# Patient Record
Sex: Male | Born: 1935 | Race: White | Hispanic: No | Marital: Married | Smoking: Former smoker
Health system: Southern US, Community
[De-identification: ages and names within clinical notes are randomized; demographics above are authoritative.]

## PROBLEM LIST (undated history)

## (undated) DIAGNOSIS — C4492 Squamous cell carcinoma of skin, unspecified: Secondary | ICD-10-CM

## (undated) DIAGNOSIS — E785 Hyperlipidemia, unspecified: Secondary | ICD-10-CM

## (undated) DIAGNOSIS — J449 Chronic obstructive pulmonary disease, unspecified: Secondary | ICD-10-CM

## (undated) DIAGNOSIS — B029 Zoster without complications: Secondary | ICD-10-CM

## (undated) DIAGNOSIS — J4489 Other specified chronic obstructive pulmonary disease: Secondary | ICD-10-CM

## (undated) DIAGNOSIS — I7100 Dissection of unspecified site of aorta: Secondary | ICD-10-CM

## (undated) DIAGNOSIS — L439 Lichen planus, unspecified: Secondary | ICD-10-CM

## (undated) DIAGNOSIS — I2510 Atherosclerotic heart disease of native coronary artery without angina pectoris: Secondary | ICD-10-CM

## (undated) HISTORY — DX: Lichen planus, unspecified: L43.9

## (undated) HISTORY — DX: Dissection of unspecified site of aorta: I71.00

## (undated) HISTORY — PX: PTCA: SHX146

## (undated) HISTORY — PX: CARDIAC CATHETERIZATION: SHX172

## (undated) HISTORY — DX: Chronic obstructive pulmonary disease, unspecified: J44.9

## (undated) HISTORY — DX: Other specified chronic obstructive pulmonary disease: J44.89

## (undated) HISTORY — DX: Atherosclerotic heart disease of native coronary artery without angina pectoris: I25.10

## (undated) HISTORY — DX: Squamous cell carcinoma of skin, unspecified: C44.92

## (undated) HISTORY — DX: Hyperlipidemia, unspecified: E78.5

## (undated) HISTORY — DX: Zoster without complications: B02.9

---

## 1991-08-25 HISTORY — PX: BACK SURGERY: SHX140

## 1998-07-22 ENCOUNTER — Emergency Department (HOSPITAL_COMMUNITY): Admission: EM | Admit: 1998-07-22 | Discharge: 1998-07-22 | Payer: Self-pay | Admitting: Emergency Medicine

## 2001-04-04 ENCOUNTER — Encounter: Payer: Self-pay | Admitting: Specialist

## 2001-04-04 ENCOUNTER — Encounter: Admission: RE | Admit: 2001-04-04 | Discharge: 2001-04-04 | Payer: Self-pay | Admitting: Specialist

## 2003-09-13 ENCOUNTER — Ambulatory Visit (HOSPITAL_COMMUNITY): Admission: RE | Admit: 2003-09-13 | Discharge: 2003-09-13 | Payer: Self-pay | Admitting: Internal Medicine

## 2004-02-23 ENCOUNTER — Inpatient Hospital Stay (HOSPITAL_COMMUNITY): Admission: EM | Admit: 2004-02-23 | Discharge: 2004-02-27 | Payer: Self-pay | Admitting: Emergency Medicine

## 2004-03-07 ENCOUNTER — Encounter: Admission: RE | Admit: 2004-03-07 | Discharge: 2004-03-07 | Payer: Self-pay | Admitting: Vascular Surgery

## 2004-07-11 ENCOUNTER — Ambulatory Visit: Payer: Self-pay | Admitting: Cardiovascular Disease

## 2004-07-14 ENCOUNTER — Ambulatory Visit: Payer: Self-pay | Admitting: Family Medicine

## 2004-07-22 ENCOUNTER — Ambulatory Visit: Payer: Self-pay | Admitting: Family Medicine

## 2004-10-16 ENCOUNTER — Ambulatory Visit: Payer: Self-pay | Admitting: Family Medicine

## 2005-01-01 ENCOUNTER — Ambulatory Visit: Payer: Self-pay | Admitting: Cardiovascular Disease

## 2005-01-02 ENCOUNTER — Ambulatory Visit: Payer: Self-pay | Admitting: Cardiology

## 2005-06-04 ENCOUNTER — Ambulatory Visit: Payer: Self-pay | Admitting: Family Medicine

## 2005-06-12 ENCOUNTER — Ambulatory Visit: Payer: Self-pay | Admitting: Family Medicine

## 2005-09-24 ENCOUNTER — Ambulatory Visit: Payer: Self-pay | Admitting: Cardiovascular Disease

## 2005-10-01 ENCOUNTER — Ambulatory Visit: Payer: Self-pay

## 2005-11-13 ENCOUNTER — Ambulatory Visit: Payer: Self-pay | Admitting: Gastroenterology

## 2005-12-04 ENCOUNTER — Ambulatory Visit: Payer: Self-pay | Admitting: Gastroenterology

## 2005-12-04 ENCOUNTER — Encounter: Payer: Self-pay | Admitting: Internal Medicine

## 2005-12-04 LAB — HM COLONOSCOPY

## 2005-12-10 ENCOUNTER — Ambulatory Visit: Payer: Self-pay | Admitting: Family Medicine

## 2006-03-25 ENCOUNTER — Ambulatory Visit: Payer: Self-pay | Admitting: Cardiovascular Disease

## 2006-06-17 ENCOUNTER — Ambulatory Visit: Payer: Self-pay | Admitting: Internal Medicine

## 2006-06-17 LAB — CONVERTED CEMR LAB
ALT: 22 units/L (ref 0–40)
AST: 24 units/L (ref 0–37)
BUN: 17 mg/dL (ref 6–23)
CO2: 29 meq/L (ref 19–32)
Creatinine, Ser: 1.1 mg/dL (ref 0.4–1.5)
PSA: 0.85 ng/mL (ref 0.10–4.00)
Potassium: 4.1 meq/L (ref 3.5–5.1)
Triglyceride fasting, serum: 105 mg/dL (ref 0–149)

## 2006-10-07 ENCOUNTER — Ambulatory Visit: Payer: Self-pay | Admitting: Cardiovascular Disease

## 2006-12-17 ENCOUNTER — Ambulatory Visit: Payer: Self-pay | Admitting: Internal Medicine

## 2006-12-23 ENCOUNTER — Ambulatory Visit: Payer: Self-pay | Admitting: Internal Medicine

## 2006-12-23 LAB — CONVERTED CEMR LAB
ALT: 25 units/L (ref 0–40)
AST: 26 units/L (ref 0–37)
Cholesterol: 110 mg/dL (ref 0–200)
Hgb A1c MFr Bld: 6.6 % — ABNORMAL HIGH (ref 4.6–6.0)
Microalb Creat Ratio: 11.9 mg/g (ref 0.0–30.0)
Microalb, Ur: 0.4 mg/dL (ref 0.0–1.9)
Total CHOL/HDL Ratio: 3.4

## 2007-01-14 ENCOUNTER — Ambulatory Visit: Payer: Self-pay | Admitting: Cardiovascular Disease

## 2007-01-14 LAB — CONVERTED CEMR LAB
GFR calc Af Amer: 94 mL/min
GFR calc non Af Amer: 78 mL/min
Potassium: 4.5 meq/L (ref 3.5–5.1)
Sodium: 142 meq/L (ref 135–145)

## 2007-01-20 ENCOUNTER — Ambulatory Visit: Payer: Self-pay | Admitting: Cardiology

## 2007-04-21 ENCOUNTER — Telehealth (INDEPENDENT_AMBULATORY_CARE_PROVIDER_SITE_OTHER): Payer: Self-pay | Admitting: *Deleted

## 2007-06-30 ENCOUNTER — Ambulatory Visit: Payer: Self-pay | Admitting: Internal Medicine

## 2007-06-30 DIAGNOSIS — J449 Chronic obstructive pulmonary disease, unspecified: Secondary | ICD-10-CM

## 2007-06-30 DIAGNOSIS — E785 Hyperlipidemia, unspecified: Secondary | ICD-10-CM | POA: Insufficient documentation

## 2007-06-30 DIAGNOSIS — E11319 Type 2 diabetes mellitus with unspecified diabetic retinopathy without macular edema: Secondary | ICD-10-CM

## 2007-06-30 DIAGNOSIS — I7100 Dissection of unspecified site of aorta: Secondary | ICD-10-CM

## 2007-06-30 DIAGNOSIS — I2510 Atherosclerotic heart disease of native coronary artery without angina pectoris: Secondary | ICD-10-CM

## 2007-07-04 LAB — CONVERTED CEMR LAB
ALT: 24 units/L (ref 0–53)
AST: 23 units/L (ref 0–37)
Basophils Relative: 0.1 % (ref 0.0–1.0)
CO2: 33 meq/L — ABNORMAL HIGH (ref 19–32)
Calcium: 9.7 mg/dL (ref 8.4–10.5)
Chloride: 104 meq/L (ref 96–112)
Eosinophils Relative: 2.7 % (ref 0.0–5.0)
GFR calc non Af Amer: 77 mL/min
Glucose, Bld: 137 mg/dL — ABNORMAL HIGH (ref 70–99)
PSA: 0.89 ng/mL (ref 0.10–4.00)
Platelets: 257 10*3/uL (ref 150–400)
RBC: 5.02 M/uL (ref 4.22–5.81)
WBC: 10 10*3/uL (ref 4.5–10.5)

## 2007-07-06 ENCOUNTER — Telehealth (INDEPENDENT_AMBULATORY_CARE_PROVIDER_SITE_OTHER): Payer: Self-pay | Admitting: *Deleted

## 2007-07-27 ENCOUNTER — Telehealth: Payer: Self-pay | Admitting: Internal Medicine

## 2007-08-16 ENCOUNTER — Ambulatory Visit: Payer: Self-pay | Admitting: Pulmonary Disease

## 2007-08-16 DIAGNOSIS — J309 Allergic rhinitis, unspecified: Secondary | ICD-10-CM

## 2007-08-23 ENCOUNTER — Telehealth: Payer: Self-pay | Admitting: Pulmonary Disease

## 2007-09-06 ENCOUNTER — Ambulatory Visit: Payer: Self-pay | Admitting: Pulmonary Disease

## 2007-09-28 ENCOUNTER — Encounter: Admission: RE | Admit: 2007-09-28 | Discharge: 2007-09-28 | Payer: Self-pay | Admitting: Internal Medicine

## 2007-09-28 ENCOUNTER — Ambulatory Visit: Payer: Self-pay | Admitting: Internal Medicine

## 2007-09-28 LAB — CONVERTED CEMR LAB
Bilirubin Urine: NEGATIVE
Specific Gravity, Urine: 1.01
Urobilinogen, UA: NEGATIVE
WBC Urine, dipstick: NEGATIVE
pH: 5

## 2007-10-03 ENCOUNTER — Telehealth: Payer: Self-pay | Admitting: Internal Medicine

## 2007-10-10 ENCOUNTER — Ambulatory Visit: Payer: Self-pay | Admitting: Cardiovascular Disease

## 2007-10-13 ENCOUNTER — Ambulatory Visit: Payer: Self-pay | Admitting: Internal Medicine

## 2007-10-28 ENCOUNTER — Telehealth (INDEPENDENT_AMBULATORY_CARE_PROVIDER_SITE_OTHER): Payer: Self-pay | Admitting: *Deleted

## 2007-12-28 ENCOUNTER — Ambulatory Visit: Payer: Self-pay | Admitting: Internal Medicine

## 2007-12-28 LAB — HM DIABETES FOOT EXAM

## 2007-12-29 ENCOUNTER — Ambulatory Visit: Payer: Self-pay

## 2007-12-30 ENCOUNTER — Ambulatory Visit: Payer: Self-pay | Admitting: Internal Medicine

## 2008-01-03 ENCOUNTER — Ambulatory Visit: Payer: Self-pay | Admitting: Cardiovascular Disease

## 2008-01-04 LAB — CONVERTED CEMR LAB
CO2: 30 meq/L (ref 19–32)
Calcium: 9.7 mg/dL (ref 8.4–10.5)
Chloride: 102 meq/L (ref 96–112)
Creatinine,U: 101.5 mg/dL
Glucose, Bld: 131 mg/dL — ABNORMAL HIGH (ref 70–99)
HDL: 27.7 mg/dL — ABNORMAL LOW (ref >39.0)
Hgb A1c MFr Bld: 6.6 % — ABNORMAL HIGH (ref 4.6–6.0)
Microalb Creat Ratio: 21.7 mg/g (ref 0.0–30.0)
Microalb, Ur: 2.2 mg/dL — ABNORMAL HIGH (ref 0.0–1.9)
Potassium: 4.9 meq/L (ref 3.5–5.1)
Sodium: 144 meq/L (ref 135–145)

## 2008-01-12 ENCOUNTER — Ambulatory Visit: Payer: Self-pay | Admitting: Internal Medicine

## 2008-01-18 ENCOUNTER — Ambulatory Visit: Payer: Self-pay | Admitting: Internal Medicine

## 2008-01-19 ENCOUNTER — Telehealth (INDEPENDENT_AMBULATORY_CARE_PROVIDER_SITE_OTHER): Payer: Self-pay | Admitting: *Deleted

## 2008-01-19 ENCOUNTER — Telehealth: Payer: Self-pay | Admitting: Internal Medicine

## 2008-03-26 ENCOUNTER — Ambulatory Visit: Payer: Self-pay | Admitting: Internal Medicine

## 2008-04-03 ENCOUNTER — Ambulatory Visit: Payer: Self-pay | Admitting: Internal Medicine

## 2008-04-04 ENCOUNTER — Telehealth (INDEPENDENT_AMBULATORY_CARE_PROVIDER_SITE_OTHER): Payer: Self-pay | Admitting: *Deleted

## 2008-04-17 ENCOUNTER — Ambulatory Visit: Payer: Self-pay | Admitting: Internal Medicine

## 2008-04-23 LAB — CONVERTED CEMR LAB
Calcium: 9 mg/dL (ref 8.4–10.5)
GFR calc Af Amer: 94 mL/min
GFR calc non Af Amer: 77 mL/min
Potassium: 4.2 meq/L (ref 3.5–5.1)
Sodium: 142 meq/L (ref 135–145)

## 2008-05-07 ENCOUNTER — Ambulatory Visit: Payer: Self-pay | Admitting: Internal Medicine

## 2008-06-05 ENCOUNTER — Telehealth (INDEPENDENT_AMBULATORY_CARE_PROVIDER_SITE_OTHER): Payer: Self-pay | Admitting: *Deleted

## 2008-06-18 ENCOUNTER — Ambulatory Visit: Payer: Self-pay | Admitting: Internal Medicine

## 2008-06-21 LAB — CONVERTED CEMR LAB
Creatinine,U: 79.4 mg/dL
Microalb Creat Ratio: 13.9 mg/g (ref 0.0–30.0)
Microalb, Ur: 1.1 mg/dL (ref 0.0–1.9)

## 2008-06-22 ENCOUNTER — Telehealth: Payer: Self-pay | Admitting: Internal Medicine

## 2008-06-23 ENCOUNTER — Ambulatory Visit: Payer: Self-pay | Admitting: Family Medicine

## 2008-10-30 ENCOUNTER — Ambulatory Visit: Payer: Self-pay | Admitting: Family Medicine

## 2008-10-30 ENCOUNTER — Encounter (INDEPENDENT_AMBULATORY_CARE_PROVIDER_SITE_OTHER): Payer: Self-pay | Admitting: *Deleted

## 2008-10-30 DIAGNOSIS — J069 Acute upper respiratory infection, unspecified: Secondary | ICD-10-CM | POA: Insufficient documentation

## 2008-10-31 ENCOUNTER — Emergency Department (HOSPITAL_COMMUNITY): Admission: EM | Admit: 2008-10-31 | Discharge: 2008-11-01 | Payer: Self-pay | Admitting: Emergency Medicine

## 2008-11-01 ENCOUNTER — Telehealth: Payer: Self-pay | Admitting: Internal Medicine

## 2008-11-02 ENCOUNTER — Ambulatory Visit: Payer: Self-pay | Admitting: Internal Medicine

## 2008-11-15 ENCOUNTER — Ambulatory Visit: Payer: Self-pay | Admitting: Internal Medicine

## 2008-11-26 ENCOUNTER — Ambulatory Visit: Payer: Self-pay | Admitting: Internal Medicine

## 2008-11-29 LAB — CONVERTED CEMR LAB
Basophils Absolute: 0 10*3/uL (ref 0.0–0.1)
Basophils Relative: 0.4 % (ref 0.0–3.0)
CO2: 31 meq/L (ref 19–32)
Calcium: 9.5 mg/dL (ref 8.4–10.5)
Glucose, Bld: 90 mg/dL (ref 70–99)
HCT: 43.2 % (ref 39.0–52.0)
Hemoglobin: 14.7 g/dL (ref 13.0–17.0)
Hgb A1c MFr Bld: 6.7 % — ABNORMAL HIGH (ref 4.6–6.5)
Lymphocytes Relative: 22.8 % (ref 12.0–46.0)
Lymphs Abs: 2.2 10*3/uL (ref 0.7–4.0)
MCHC: 33.9 g/dL (ref 30.0–36.0)
Monocytes Relative: 11.9 % (ref 3.0–12.0)
Neutro Abs: 5.7 10*3/uL (ref 1.4–7.7)
Potassium: 4.7 meq/L (ref 3.5–5.1)
RBC: 4.76 M/uL (ref 4.22–5.81)
RDW: 12.1 % (ref 11.5–14.6)
Sodium: 138 meq/L (ref 135–145)

## 2009-01-10 ENCOUNTER — Ambulatory Visit: Payer: Self-pay | Admitting: Cardiovascular Disease

## 2009-01-10 DIAGNOSIS — I4510 Unspecified right bundle-branch block: Secondary | ICD-10-CM

## 2009-04-23 ENCOUNTER — Ambulatory Visit: Payer: Self-pay | Admitting: Internal Medicine

## 2009-05-01 ENCOUNTER — Telehealth (INDEPENDENT_AMBULATORY_CARE_PROVIDER_SITE_OTHER): Payer: Self-pay | Admitting: *Deleted

## 2009-05-01 LAB — CONVERTED CEMR LAB
Cholesterol: 103 mg/dL (ref 0–200)
Creatinine,U: 82.3 mg/dL
Hgb A1c MFr Bld: 7 % — ABNORMAL HIGH (ref 4.6–6.5)
Microalb Creat Ratio: 20.7 mg/g (ref 0.0–30.0)
PSA: 0.79 ng/mL (ref 0.10–4.00)

## 2009-05-22 ENCOUNTER — Telehealth (INDEPENDENT_AMBULATORY_CARE_PROVIDER_SITE_OTHER): Payer: Self-pay | Admitting: *Deleted

## 2009-06-14 ENCOUNTER — Ambulatory Visit: Payer: Self-pay | Admitting: Internal Medicine

## 2009-06-17 ENCOUNTER — Encounter (INDEPENDENT_AMBULATORY_CARE_PROVIDER_SITE_OTHER): Payer: Self-pay | Admitting: *Deleted

## 2009-06-17 LAB — CONVERTED CEMR LAB
ALT: 26 units/L (ref 0–53)
AST: 25 units/L (ref 0–37)
Creatinine, Ser: 1 mg/dL (ref 0.4–1.5)
Hgb A1c MFr Bld: 6.7 % — ABNORMAL HIGH (ref 4.6–6.5)

## 2009-08-26 ENCOUNTER — Ambulatory Visit: Payer: Self-pay | Admitting: Internal Medicine

## 2009-08-27 ENCOUNTER — Telehealth (INDEPENDENT_AMBULATORY_CARE_PROVIDER_SITE_OTHER): Payer: Self-pay | Admitting: *Deleted

## 2009-09-06 ENCOUNTER — Encounter: Payer: Self-pay | Admitting: Cardiovascular Disease

## 2009-09-06 ENCOUNTER — Encounter: Payer: Self-pay | Admitting: Internal Medicine

## 2009-09-06 LAB — CONVERTED CEMR LAB
ALT: 28 units/L
AST: 10 units/L
Calcium: 9.5 mg/dL
Cholesterol: 126 mg/dL
Glucose, Bld: 119 mg/dL
Hgb A1c MFr Bld: 6.8 %
LDL Cholesterol: 57 mg/dL
TSH: 2.28 microintl units/mL
platelet count: 258 10*3/uL

## 2009-10-15 ENCOUNTER — Encounter: Payer: Self-pay | Admitting: Cardiovascular Disease

## 2009-11-19 ENCOUNTER — Encounter: Payer: Self-pay | Admitting: Internal Medicine

## 2009-11-25 ENCOUNTER — Ambulatory Visit: Payer: Self-pay | Admitting: Internal Medicine

## 2009-12-23 ENCOUNTER — Ambulatory Visit: Payer: Self-pay | Admitting: Cardiovascular Disease

## 2009-12-23 ENCOUNTER — Telehealth (INDEPENDENT_AMBULATORY_CARE_PROVIDER_SITE_OTHER): Payer: Self-pay | Admitting: *Deleted

## 2010-01-06 ENCOUNTER — Telehealth: Payer: Self-pay | Admitting: Cardiovascular Disease

## 2010-01-29 ENCOUNTER — Telehealth: Payer: Self-pay | Admitting: Cardiovascular Disease

## 2010-02-26 ENCOUNTER — Telehealth (INDEPENDENT_AMBULATORY_CARE_PROVIDER_SITE_OTHER): Payer: Self-pay | Admitting: *Deleted

## 2010-03-26 ENCOUNTER — Ambulatory Visit: Payer: Self-pay | Admitting: Internal Medicine

## 2010-04-24 ENCOUNTER — Ambulatory Visit: Payer: Self-pay | Admitting: Internal Medicine

## 2010-05-01 ENCOUNTER — Telehealth: Payer: Self-pay | Admitting: Internal Medicine

## 2010-05-01 LAB — CONVERTED CEMR LAB
AST: 24 units/L (ref 0–37)
BUN: 21 mg/dL (ref 6–23)
CO2: 28 meq/L (ref 19–32)
Chloride: 103 meq/L (ref 96–112)
Creatinine, Ser: 1.1 mg/dL (ref 0.4–1.5)
Glucose, Bld: 106 mg/dL — ABNORMAL HIGH (ref 70–99)
Hgb A1c MFr Bld: 6.4 % (ref 4.6–6.5)
Microalb Creat Ratio: 3.3 mg/g (ref 0.0–30.0)
PSA: 0.73 ng/mL (ref 0.10–4.00)

## 2010-05-08 ENCOUNTER — Ambulatory Visit: Payer: Self-pay | Admitting: Internal Medicine

## 2010-05-09 ENCOUNTER — Telehealth (INDEPENDENT_AMBULATORY_CARE_PROVIDER_SITE_OTHER): Payer: Self-pay | Admitting: *Deleted

## 2010-05-12 ENCOUNTER — Telehealth: Payer: Self-pay | Admitting: Cardiovascular Disease

## 2010-05-19 ENCOUNTER — Encounter (INDEPENDENT_AMBULATORY_CARE_PROVIDER_SITE_OTHER): Payer: Self-pay | Admitting: *Deleted

## 2010-05-28 ENCOUNTER — Encounter: Payer: Self-pay | Admitting: Internal Medicine

## 2010-06-24 ENCOUNTER — Ambulatory Visit: Payer: Self-pay | Admitting: Cardiovascular Disease

## 2010-07-03 ENCOUNTER — Ambulatory Visit: Payer: Self-pay | Admitting: Internal Medicine

## 2010-07-03 ENCOUNTER — Ambulatory Visit: Payer: Self-pay | Admitting: Cardiovascular Disease

## 2010-07-03 ENCOUNTER — Telehealth: Payer: Self-pay | Admitting: Internal Medicine

## 2010-07-07 LAB — CONVERTED CEMR LAB: Potassium: 5.1 meq/L (ref 3.5–5.1)

## 2010-07-22 ENCOUNTER — Telehealth (INDEPENDENT_AMBULATORY_CARE_PROVIDER_SITE_OTHER): Payer: Self-pay | Admitting: *Deleted

## 2010-08-01 ENCOUNTER — Telehealth: Payer: Self-pay | Admitting: Internal Medicine

## 2010-09-23 NOTE — Progress Notes (Signed)
Summary: Cholesterol   Phone Note Call from Patient   Caller: Patient Summary of Call: Pt is concerned that his cholesterol wasn't checked the last time he had a cpx. His Cardiologist also told him that it wasn't checked. Is there a reason for this? Please advise. [phone] Initial call taken by: Lavell Islam,  July 03, 2010 9:57 AM  Follow-up for Phone Call        his cholesterol has been under excellent control for years. My recommendation is to recheck once a year. (last time checked 08-2009)   Follow-up by: Shawna Kiener E. Tanette Chauca MD,  July 03, 2010 12:26 PM  Additional Follow-up for Phone Call Additional follow up Details #1::        left message for pt to call back. Army Fossa CMA  July 03, 2010 1:16 PM     Additional Follow-up for Phone Call Additional follow up Details #2::    Pt is aware. Army Fossa CMA  July 03, 2010 1:45 PM

## 2010-09-23 NOTE — Progress Notes (Signed)
Summary: Need samples   Phone Note Call from Patient Call back at Home Phone [phone]   Caller: Spouse/Pat Summary of Call: Need samples Vytorin 10/80mg , PLavix 75mg   Initial call taken by: Judie Grieve,  May 12, 2010 2:31 PM  Follow-up for Phone Call        no samples of Vytorin 10/80, pt in doughnut hole at this point until end of year so meds out of pocket expense. Request generic cholesterol med. adv would check w/MD. Claris Gladden, RN, BSN 9/19 1503 samples up front and not forwarded for md review next week. Claris Gladden, RN, BSN  Additional Follow-up for Phone Call Additional follow up Details #1::        Can try Pravachol 40mg  and F/U lipids and liver in 3 months Additional Follow-up by: Colon Branch, MD, [facility],  May 18, 2010 9:09 PM    Additional Follow-up for Phone Call Additional follow up Details #2::    spoke with pt, he would like to try the pravachol. script sent in Deliah Goody, RN  May 19, 2010 5:00 PM

## 2010-09-23 NOTE — Assessment & Plan Note (Signed)
Summary: CPX/KDC   Vital Signs:  Patient profile:   77 year old male Height:      69.25 inches Weight:      171.13 pounds Pulse rate:   59 / minute Pulse rhythm:   regular BP sitting:   118 / 82  (left arm) Cuff size:   large  Vitals Entered By: Army Fossa CMA (April 24, 2010 8:46 AM) CC: CPX: fasting   History of Present Illness: Here for Medicare AWV:  1.   Risk factors based on Past M, S, F history: yes  2.   Physical Activities: [employer] 3 times a week, active  3.   Depression/mood: denies, no problems noted  4.   Hearing: poor hearing , has hearing aids, sees audiology 5.   ADL's: totally independent  6.   Fall Risk: low  7.   Home Safety: feels safe at home  8.   Height, weight, &visual acuity:see VS, vision corrected see eye doctor yearly  9.   Counseling: yes , see below  10.   Labs ordered based on risk factors: yes  11.           Referral Coordination: if needed  12.           Care Plan: see A/P 13.            Cognitive Assessment : cognition, memory, motor skills seem appropriate.  in addition, we discussed the following issues  COPD, asthma-- uses inhalers hardly ever  Coronary artery disease, good medication compliance  Diabetes-- ambulatory CBGs wnl, 100 this am, never > than 120  Hx of AORTIC DISSECTION-- chart reviewed , [facility]    Preventive Screening-Counseling & Management  Caffeine-Diet-Exercise     Does Patient Exercise: yes     Times/week: 3  Allergies: No Known Drug Allergies  Past History:  Past Medical History: Reviewed history from 08/26/2009 and no changes required.    COPD--asthma    Coronary artery disease,s/p MI 1992    Diabetes mellitus, type II    Hyperlipidemia    Allergic Rhinitis    Hx of AORTIC DISSECTION--f/u by cards  Past Surgical History: Reviewed history from 01/09/2009 and no changes required. Back surgery, 1993   Left heart catheterization with coronary angiography and left  ventriculography.  Percutaneous transluminal coronary angioplasty with placement of a drugeluting stent in the distal right coronary artery.. Percutaneous transluminal coronary angioplasty with placement of a drugeluting stent in the proximal right coronary artery.CARDIOLOGIST:  Carole Binning, M.D.  Family History: Reviewed history from 08/16/2007 and no changes required. 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  (quit 1994) pt is retired from [employer]. Alcohol use-no Drug use-no Regular exercise-- yes, joined a exercise group! diet-- improved! caffeine use - coffee (2 cups daily) Does Patient Exercise:  yes  Review of Systems CV:  Denies chest pain or discomfort and swelling of feet. Resp:  Denies cough, shortness of breath, and wheezing. GI:  Denies bloody stools, diarrhea, and nausea. GU:  Denies dysuria, hematuria, urinary frequency, and urinary hesitancy; Rarely has nocturia .  Physical Exam  General:  alert, well-developed, and well-nourished.   Neck:  no masses, no thyromegaly, and normal carotid upstroke.   Lungs:  normal respiratory effort, no intercostal retractions, no accessory muscle use, and normal  breath sounds.   Heart:  normal rate, regular rhythm, no murmur, and no gallop.   Abdomen:  soft, non-tender, no distention, no masses, no guarding, and no rigidity.  no bruit Rectal:  No external abnormalities noted. Normal sphincter tone. No rectal masses or tenderness. Prostate:  Prostate gland firm and smooth, no enlargement, nodularity, tenderness, mass, asymmetry or induration. Extremities:  lower lower extremity edema Psych:  Cognition and judgment appear intact. Alert and cooperative with normal attention span and concentration,not anxious appearing and not depressed appearing.      Impression & Recommendations:  Problem # 1:  HEALTH SCREENING (ICD-V70.0)  chart reviewed  Td 2008 pneumonia shot 2008 flu shot--today Shingles shot-- had already   Cscope 2007, TICS, no polyps (Dr Jarold Motto). Next Cscope...per GI  prostate is normal, check PSA   counseled  about diet exercise    Orders: Medicare -1st Annual Wellness Visit [phone])  Problem # 2:  HYPERLIPIDEMIA (ICD-272.4) well controlled  His updated medication list for this problem includes:    Vytorin 10-80 Mg Tabs (Ezetimibe-simvastatin) .[name]... 1 by mouth qhs  Labs Reviewed: SGOT: 10 (09/06/2009)   SGPT: 28 (09/06/2009)   HDL:33 (09/06/2009), 32.30 (04/23/2009)  LDL:57 (09/06/2009), 51 (04/23/2009)  Chol:126 (09/06/2009), 103 (04/23/2009)  Trig:181 (09/06/2009), 97.0 (04/23/2009)  Orders: Venipuncture (47829) TLB-ALT (SGPT) (84460-ALT) TLB-AST (SGOT) (84450-SGOT)  Problem # 3:  CORONARY ARTERY DISEASE (ICD-414.00) asymptomatic, controlling all his risk factors His updated medication list for this problem includes:    Metoprolol Tartrate 25 Mg Tabs (Metoprolol tartrate) .[name]... 1 tab by mouth two times a day    Cozaar 25 Mg Tabs (Losartan potassium) .[name]... Take one tablet by mouth daily    Plavix 75 Mg Tabs (Clopidogrel bisulfate) .[name]... Take 1 tablet by mouth once a day    Isosorbide Mononitrate Cr 60 Mg Xr24h-tab (Isosorbide mononitrate) .[name]... Take one tablet by mouth daily    Nitroglycerin 0.4 Mg Subl (Nitroglycerin) .[name]... 1 sl  every 5 min as needed chest pain x 3 tabs    Aspirin 325 Mg Tabs (Aspirin) .[name]... Take 1 tablet by mouth once a day  Orders: TLB-BMP (Basic Metabolic Panel-BMET) (80048-METABOL)  Problem # 4:  COPD (ICD-496) essentially asymptomatic The following medications were removed from the medication list:    Advair Diskus 100-50 Mcg/dose Misc (Fluticasone-salmeterol) .[name]... 1 puff two times a day His updated medication list for this problem includes:    Ventolin Hfa 108 (90  Base) Mcg/act Aers (Albuterol sulfate) .[name]... 2 puff every 6 hours as needed  Problem # 5:  AORTIC DISSECTION (ICD-441.00) last CT done at the [place] was 2-11  Complete Medication List: 1)  Metoprolol Tartrate 25 Mg Tabs (Metoprolol tartrate) .[name].. 1 tab by mouth two times a day 2)  Cozaar 25 Mg Tabs (Losartan potassium) .... Take one tablet by mouth daily 3)  Vytorin 10-80 Mg Tabs (Ezetimibe-simvastatin) .[name].. 1 by mouth qhs 4)  Plavix 75 Mg Tabs (Clopidogrel bisulfate) .... Take 1 tablet by mouth once a day 5)  Isosorbide Mononitrate Cr 60 Mg Xr24h-tab (Isosorbide mononitrate) .... Take one tablet by mouth daily 6)  Nitroglycerin 0.4 Mg Subl (Nitroglycerin) .[name].. 1 sl  every 5 min as needed chest pain x 3 tabs 7)  Ventolin Hfa 108 (90 Base) Mcg/act Aers (Albuterol sulfate) .... 2 puff every 6 hours as needed 8)  Aspirin 325 Mg Tabs (Aspirin) .... Take 1 tablet by mouth once a day 9)  Antivert 25 Mg Tabs (Meclizine hcl) .[name].. 1 by mouth qid  as needed dizziness 10)  Glucophage 500 Mg Tabs (Metformin hcl) .[name].. 1 by mouth once daily. 11)  Freestyle Lite Test Strp (Glucose blood) .... Check bs 1x/day dx 250.00 12)  Freestyle Lite Lancets  .... Checks bs 1x/day dx 250.00 13)  Fish Oil  .... Qd  Other Orders: Flu Vaccine 63yrs + (62831) Administration Flu vaccine - MCR (G0008) TLB-Microalbumin/Creat Ratio, Urine (82043-MALB) TLB-A1C / Hgb A1C (Glycohemoglobin) (83036-A1C) TLB-PSA (Prostate Specific Antigen) (84153-PSA)  Patient Instructions: 1)  Please schedule a follow-up appointment in 6 months .    Risk Factors:  Exercise:  yes    Times per week:  3   Flu Vaccine Consent Questions     Do you have a history of severe allergic reactions to this vaccine? no    Any prior history of allergic reactions to egg and/or gelatin? no    Do you have a sensitivity to the preservative Thimersol? no    Do you have a past history of Guillan-Barre Syndrome? no    Do you currently have an acute febrile  illness? no    Have you ever had a severe reaction to latex? no    Vaccine information given and explained to patient? yes    Are you currently pregnant? no    Lot Number:[id number]   Exp Date:02/21/2011   Site Given  Left Deltoid IM     .lbmedflu

## 2010-09-23 NOTE — Progress Notes (Signed)
Summary: Refill--Metoprolol  Phone Note Refill Request Message from:  Fax from Pharmacy on August 01, 2010 4:48 PM  Refills Requested: Medication #1:  METOPROLOL TARTRATE 25 MG  TABS 1 tab by mouth two times a day   Notes: [facility] Initial call taken by: Lucious Groves CMA,  August 01, 2010 4:48 PM    Prescriptions: METOPROLOL TARTRATE 25 MG  TABS (METOPROLOL TARTRATE) 1 tab by mouth two times a day  #180 x 3   Entered by:   Lucious Groves CMA   Authorized by:   [name]. Brooklyn Alfredo MD   Signed by:   Lucious Groves CMA on 08/01/2010   Method used:   Faxed to ...       [facility]* (retail)       [address] [place], [place]  [address]       Ph: [phone]       Fax: [phone]   RxID:   [id number]

## 2010-09-23 NOTE — Miscellaneous (Signed)
Summary: labs from [place]   Clinical Lists Changes  Observations: Added new observation of PLATELET CNT: 258 10*3/microliter (09/06/2009 16:02) Added new observation of HGB: 14.8 g/dL (04/54/0981 19:14) Added new observation of WBC: 11.12 10*3/mm3 (09/06/2009 16:02) Added new observation of HGBA1C: 6.8 % (09/06/2009 16:02) Added new observation of TSH: 2.28 microintl units/mL (09/06/2009 16:02) Added new observation of SGPT (ALT): 28 units/L (09/06/2009 16:02) Added new observation of SGOT (AST): 10 units/L (09/06/2009 16:02) Added new observation of CALCIUM: 9.5 mg/dL (78/29/5621 30:86) Added new observation of BG RANDOM: 119 mg/dL (57/84/6962 95:28) Added new observation of CREATININE: 1.1 mg/dL (41/32/4401 02:72) Added new observation of POTASSIUM: 4.7 mmol/L (09/06/2009 16:02) Added new observation of SODIUM: 138 mmol/L (09/06/2009 16:02) Added new observation of TRIGLYCERIDE: 181 mg/dL (53/66/4403 47:42) Added new observation of HDL: 33 mg/dL (59/56/3875 64:33) Added new observation of LDL: 57 mg/dL (29/51/8841 66:06) Added new observation of CHOLESTEROL: 126 mg/dL (30/16/0109 32:35)      Lipid Panel Test Date: 09/06/2009                        Value        Units        H/L   Reference  Cholesterol:          126          mg/dL              (573-220) LDL Cholesterol:      57           mg/dL              (25-427) HDL Cholesterol:      33           mg/dL              (06-23) Triglyceride:         181          mg/dL              (76-283) TDVV6H                6.8                                Chemistry Labs Test Date: 09/06/2009                      Value Units        H/L   Reference  Sodium:             138   mmol/L             (137-145) Potassium:          4.7   mmol/L             (3.6-5.0) Creatinine:         1.1   mg/dL              (6.0-7.3) Glucose-random:     119   mg/dL         H    (71-062) Calcium (total):    9.5   mg/dL              (6-94.8) SGOT:                10    U/L                (  10-40) SGPT:               28    U/L                (10-40)    Lab Entry Test Date: 09/06/2009                        Value        Units        H/L   Reference  TSH:                  2.28         mIU/ml             (0.5-5.0)    Complete Blood Count Test Date: 09/06/2009             Value   Units      H/L    Reference  WBC:       11.12 X 10^3/uL     H    (3.5-10.0) Hgb:       14.8  g/dl               (84.6-96.2) Platelets: 258   X 10^3/uL          (150-450)

## 2010-09-23 NOTE — Assessment & Plan Note (Signed)
Summary: remove wax from ear/cbs   Vital Signs:  Patient profile:   77 year old male Weight:      172.25 pounds Pulse rate:   53 / minute Pulse rhythm:   regular BP sitting:   116 / 80  (left arm) Cuff size:   large  Vitals Entered By: Army Fossa CMA (March 26, 2010 1:04 PM) CC: Pt here for dr to look at ears before audiologist appt.   History of Present Illness: apparently had a lot of wax in his ears Last week he put some oil in there and flushed the ears He's concerned because he has a hearing test soon ROS No ear discharge or pain He saw a urologist at the [facility] in  reference to  hematuria, he was told he had a renal cyst, no further workup  Current Medications (verified): 1)  Metoprolol Tartrate 25 Mg  Tabs (Metoprolol Tartrate) .[name].. 1 Tab By Mouth Two Times A Day 2)  Cozaar 25 Mg Tabs (Losartan Potassium) .... Take One Tablet By Mouth Daily 3)  Vytorin 10-80 Mg  Tabs (Ezetimibe-Simvastatin) .[name].. 1 By Mouth Qhs 4)  Plavix 75 Mg  Tabs (Clopidogrel Bisulfate) .... Take 1 Tablet By Mouth Once A Day 5)  Isosorbide Mononitrate Cr 60 Mg Xr24h-Tab (Isosorbide Mononitrate) .... Take One Tablet By Mouth Daily 6)  Nitroglycerin 0.4 Mg  Subl (Nitroglycerin) .[name].. 1 Sl  Every 5 Min As Needed Chest Pain X 3 Tabs 7)  Ventolin Hfa 108 (90 Base) Mcg/act  Aers (Albuterol Sulfate) .... 2 Puff Every 6 Hours As Needed 8)  Advair Diskus 100-50 Mcg/dose Misc (Fluticasone-Salmeterol) .[name].. 1 Puff Two Times A Day 9)  Aspirin 325 Mg  Tabs (Aspirin) .... Take 1 Tablet By Mouth Once A Day 10)  Antivert 25 Mg Tabs (Meclizine Hcl) .[name].. 1 By Mouth Qid As Needed Dizziness 11)  Glucophage 500 Mg Tabs (Metformin Hcl) .[name].. 1 By Mouth Once Daily. 12)  Freestyle Lite Test  Strp (Glucose Blood) .... Check Bs 1x/day Dx 250.00 13)  Freestyle Lite Lancets .... Checks Bs 1x/day Dx 250.00  Allergies (verified): No Known Drug Allergies  Past History:  Past Medical History: Reviewed history from 08/26/2009  and no changes required.    COPD--asthma    Coronary artery disease,s/p MI 1992    Diabetes mellitus, type II    Hyperlipidemia    Allergic Rhinitis    Hx of AORTIC DISSECTION--f/u by cards  Past Surgical History: Reviewed history from 01/09/2009 and no changes required. Back surgery, 1993   Left heart catheterization with coronary angiography and left ventriculography.  Percutaneous transluminal coronary angioplasty with placement of a drugeluting stent in the distal right coronary artery.. Percutaneous transluminal coronary angioplasty with placement of a drugeluting stent in the proximal right coronary artery.CARDIOLOGIST:  Carole Binning, M.D.  Social History: Reviewed history from 11/25/2009 and no changes required. 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  (quit 1994) pt is retired from [employer]. Alcohol use-no Drug use-no Regular exercise-- yes, joined a exercise group! diet-- improved! caffeine use - coffee (2 cups daily)  Physical Exam  General:  alert and well-developed.   Head:  face symmetric Ears:  R ear normal and L ear normal.     Impression & Recommendations:  Problem # 1:  ? of CERUMEN IMPACTION (ICD-380.4) if he had a cerumen in addition, that is clear  Complete Medication List: 1)  Metoprolol Tartrate 25 Mg Tabs (Metoprolol  tartrate) .[name].. 1 tab by mouth two times a day 2)  Cozaar 25 Mg Tabs (Losartan potassium) .... Take one tablet by mouth daily 3)  Vytorin 10-80 Mg Tabs (Ezetimibe-simvastatin) .[name].. 1 by mouth qhs 4)  Plavix 75 Mg Tabs (Clopidogrel bisulfate) .... Take 1 tablet by mouth once a day 5)  Isosorbide Mononitrate Cr 60 Mg Xr24h-tab (Isosorbide mononitrate) .... Take one tablet by mouth daily 6)  Nitroglycerin 0.4 Mg Subl (Nitroglycerin) .[name].. 1 sl  every 5 min as needed chest pain x 3 tabs 7)  Ventolin Hfa 108 (90 Base) Mcg/act Aers (Albuterol sulfate) .... 2 puff every 6 hours as needed 8)  Advair  Diskus 100-50 Mcg/dose Misc (Fluticasone-salmeterol) .[name].. 1 puff two times a day 9)  Aspirin 325 Mg Tabs (Aspirin) .... Take 1 tablet by mouth once a day 10)  Antivert 25 Mg Tabs (Meclizine hcl) .[name].. 1 by mouth qid as needed dizziness 11)  Glucophage 500 Mg Tabs (Metformin hcl) .[name].. 1 by mouth once daily. 12)  Freestyle Lite Test Strp (Glucose blood) .... Check bs 1x/day dx 250.00 13)  Freestyle Lite Lancets  .... Checks bs 1x/day dx 250.00

## 2010-09-23 NOTE — Progress Notes (Signed)
Summary: WIFE HAS QUESTION RE CT SCAN   Phone Note Call from Patient Call back at Home Phone [phone]   Caller: Spouse PAT Reason for Call: Talk to Nurse Summary of Call: WANTS TO KNOW IF DR [name] WILL REC CT SCAN OR NOT Initial call taken by: Glynda Jaeger,  January 29, 2010 10:55 AM  Follow-up for Phone Call        01/29/10-1130am--wife calling stating ct scan from [place] has been sent--has dr [name] reviewed, does he need another scan?, what's the next step--advised dr [name] not seeing pts. today, but will pass along questions to debra and dr nishan--nt Follow-up by: Ledon Snare, RN,  January 29, 2010 11:22 AM     Appended Document: WIFE HAS QUESTION RE CT SCAN CT scan from ? 2/11 showed stable chronic distal disection with no aneurysm.  Had a hyerdense ? renal cyst that needs F/U ??  From aorta standpoint can do [place] in a year  Appended Document: WIFE HAS QUESTION RE CT SCAN PT'S WIFE AWARE./CY

## 2010-09-23 NOTE — Progress Notes (Signed)
Summary: New Refill  Phone Note Refill Request Call back at Home Phone [phone] Message from:  Patient on August 27, 2009 1:56 PM  Refills Requested: Medication #1:  PLAVIX 75 MG  TABS Take 1 tablet by mouth once a day Patient has been out 2 days [facility] on [address].   Method Requested: Telephone to Pharmacy Initial call taken by: Barnie Mort,  August 27, 2009 1:57 PM    Prescriptions: PLAVIX 75 MG  TABS (CLOPIDOGREL BISULFATE) Take 1 tablet by mouth once a day  #90 x 4   Entered by:   Shary Decamp   Authorized by:   [name]. [name]   Signed by:   Shary Decamp on 08/27/2009   Method used:   Telephoned to ...       [facility] [address] (retail)       [phone]       [place], [place]  [address]       Ph: [phone]       Fax: [phone]   RxID:   [id number]

## 2010-09-23 NOTE — Assessment & Plan Note (Signed)
Summary: F6M/DM      Allergies Added: NKDA  Primary Provider:  [name]. [name]   History of Present Illness: Phillip Jordan seen today in followup for his coronary artery disease, AAA  with distal dissection, and some mild shortness of breath.  He also has a chronic right bundle branch block.  He has not any significant chest pain.  His last Myoview was Dec 29, 2007 there is a fixed defect in the inferior wall suggesting possible previous infarction with no really significant ischemia.  His EF was 69%.  He's had previous stents to the RCA and circumflex.  His last catheter was in July of 2005.  At that time he received 2 stents to the RCA.  The circumflex coronary artery stent was done prior to 2005.  He is active with his grandchildren. .  He has no angiina PND orthopnea no palpitations or syncope.    His ARB was stopped due to high K which is being repeated this week.   He will get a fasting lipid at that time.  His LFT's were recently nomral.  He needs F/U for a renal cyst seen on CT 2/11 at [facility]  I mentioned this to the patient and he will make sure the [place] doctor or [name] F/U with cyst.  He will have a f/u US for his distal disection when he sees me in February  Current Problems (verified): 1)  Hyperlipidemia  (ICD-272.4) 2)  Coronary Artery Disease  (ICD-414.00) 3)  COPD  (ICD-496) 4)  Asthma  (ICD-493.90) 5)  Diabetes Mellitus, Type II  (ICD-250.00) 6)  Aortic Dissection  (ICD-441.00) 7)  Special Screening Malignant Neoplasm of Prostate  (ICD-V76.44) 8)  Bundle Branch Block, Right  (ICD-426.4) 9)  Hx of Aortic Dissection  (ICD-441.00) 10)  Allergic Rhinitis  (ICD-477.9) 11)  Health Screening  (ICD-V70.0)  Current Medications (verified): 1)  Metoprolol Tartrate 25 Mg  Tabs (Metoprolol Tartrate) .[name].. 1 Tab By Mouth Two Times A Day 2)  Pravastatin Sodium 40 Mg Tabs (Pravastatin Sodium) .... Take One Tablet By Mouth Daily At Bedtime 3)  Plavix 75 Mg  Tabs (Clopidogrel Bisulfate) .... Take  1 Tablet By Mouth Once A Day 4)  Isosorbide Mononitrate Cr 60 Mg Xr24h-Tab (Isosorbide Mononitrate) .... Take One Tablet By Mouth Daily 5)  Nitroglycerin 0.4 Mg  Subl (Nitroglycerin) .[name].. 1 Sl  Every 5 Min As Needed Chest Pain X 3 Tabs 6)  Ventolin Hfa 108 (90 Base) Mcg/act  Aers (Albuterol Sulfate) .... 2 Puff Every 6 Hours As Needed 7)  Aspirin 325 Mg  Tabs (Aspirin) .... Take 1 Tablet By Mouth Once A Day 8)  Antivert 25 Mg Tabs (Meclizine Hcl) .[name].. 1 By Mouth Qid As Needed Dizziness 9)  Glucophage 500 Mg Tabs (Metformin Hcl) .[name].. 1 By Mouth Once Daily. 10)  Freestyle Lite Test  Strp (Glucose Blood) .... Check Bs 1x/day Dx 250.00 11)  Freestyle Lite Lancets .... Checks Bs 1x/day Dx 250.00 12)  Fish Oil .... Qd  Allergies (verified): No Known Drug Allergies  Comments:  Nurse/Medical Assistant: patient reviwed previous med list and stated meds are correct  Past History:  Past Medical History: Last updated: 08/26/2009    COPD--asthma    Coronary artery disease,s/p MI 1992    Diabetes mellitus, type II    Hyperlipidemia    Allergic Rhinitis    Hx of AORTIC DISSECTION--f/u by cards  Past Surgical History: Last updated: 01/09/2009 Back surgery, 1993   Left heart catheterization  with coronary angiography and left ventriculography.  Percutaneous transluminal coronary angioplasty with placement of a drugeluting stent in the distal right coronary artery.. Percutaneous transluminal coronary angioplasty with placement of a drugeluting stent in the proximal right coronary artery.CARDIOLOGIST:  Carole Binning, M.D.  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  (quit 1994) pt is retired from [employer]. Alcohol use-no Drug use-no Regular  exercise-- yes, joined a exercise group! diet-- improved! caffeine use - coffee (2 cups daily)  Review of Systems       Denies fever, malais, weight loss, blurry vision, decreased visual acuity, cough, sputum, SOB, hemoptysis, pleuritic pain, palpitaitons, heartburn, abdominal pain, melena, lower extremity edema, claudication, or rash.   Vital Signs:  Patient profile:   77 year old male Weight:      170 pounds BMI:     25.01 Pulse rate:   64 / minute BP sitting:   134 / 60  (right arm)  Vitals Entered By: Dreama Saa, CNA (June 24, 2010 9:27 AM)  Physical Exam  General:  Affect appropriate Healthy:  appears stated age HEENT: normal Neck supple with no adenopathy JVP normal no bruits no thyromegaly Lungs clear with no wheezing and good diaphragmatic motion Heart:  S1/S2 no murmur,rub, gallop or click PMI normal Abdomen: benighn, BS positve, no tenderness, no AAA no bruit.  No HSM or HJR Distal pulses intact with no bruits No edema Neuro non-focal Skin warm and dry    Impression & Recommendations:  Problem # 1:  HYPERLIPIDEMIA (ICD-272.4) Fasting lipids this week.  Continue statin His updated medication list for this problem includes:    Pravastatin Sodium 40 Mg Tabs (Pravastatin sodium) .[name]... Take one tablet by mouth daily at bedtime  Problem # 2:  CORONARY ARTERY DISEASE (ICD-414.00) No angina continue ASA/Plavix and BB His updated medication list for this problem includes:    Metoprolol Tartrate 25 Mg Tabs (Metoprolol tartrate) .[name]... 1 tab by mouth two times a day    Plavix 75 Mg Tabs (Clopidogrel bisulfate) .[name]... Take 1 tablet by mouth once a day    Isosorbide Mononitrate Cr 60 Mg Xr24h-tab (Isosorbide mononitrate) .[name]... Take one tablet by mouth daily    Nitroglycerin 0.4 Mg Subl (Nitroglycerin) .[name]... 1 sl  every 5 min as needed chest pain x 3 tabs    Aspirin 325 Mg Tabs (Aspirin) .[name]... Take 1 tablet by mouth once a day  Problem # 3:  AORTIC DISSECTION  (ICD-441.00) No abdominal pain.  F/U duplex in 2/12  BP well controlled  Other Orders: Abdominal Aorta Duplex (Abd Aorta Duplex)  Patient Instructions: 1)  Your physician recommends that you schedule a follow-up appointment in:  2)  Your physician has requested that you have an abdominal aorta duplex. During this test, an ultrasound is used to evaluate the aorta. Allow 30 minutes for this exam. Do not eat after midnight the day before and avoid carbonated beverages. There are no restrictions or special instructions.

## 2010-09-23 NOTE — Medication Information (Signed)
Summary: Diabetes Supplies/[facility]  [facility]   Imported By: Lanelle Bal 11/26/2009 09:23:36  _____________________________________________________________________  External Attachment:    Type:   Image     Comment:   External Document

## 2010-09-23 NOTE — Progress Notes (Signed)
Summary: REFILL  Phone Note Refill Request Call back at Home Phone [phone] Message from:  Patient on July 22, 2010 9:09 AM  Refills Requested: Medication #1:  METOPROLOL TARTRATE 25 MG  TABS 1 tab by mouth two times a day   Dosage confirmed as above?Dosage Confirmed   Supply Requested: 1 month   Last Refilled: 05/09/2010 [facility] [address]  Next Appointment Scheduled: 12.5.11 Initial call taken by: Lavell Islam,  July 22, 2010 9:09 AM    Prescriptions: METOPROLOL TARTRATE 25 MG  TABS (METOPROLOL TARTRATE) 1 tab by mouth two times a day  #30 x 1   Entered by:   Army Fossa CMA   Authorized by:   [name]. [name]   Signed by:   Army Fossa CMA on 07/22/2010   Method used:   Electronically to        [facility] [address] (retail)       [phone]       [place], [place]  [address]       Ph: [phone]       Fax: [phone]   RxID:   [id number]

## 2010-09-23 NOTE — Miscellaneous (Signed)
  Clinical Lists Changes  Medications: Changed medication from VYTORIN 10-80 MG  TABS (EZETIMIBE-SIMVASTATIN) 1 by mouth qhs to PRAVASTATIN SODIUM 40 MG TABS (PRAVASTATIN SODIUM) Take one tablet by mouth daily at bedtime - Signed Rx of PRAVASTATIN SODIUM 40 MG TABS (PRAVASTATIN SODIUM) Take one tablet by mouth daily at bedtime;  #30 x 12;  Signed;  Entered by: Deliah Goody, RN;  Authorized by: Colon Branch, MD, [facility];  Method used: Electronically to [facility] [phone] [place], [place], [place]  [address], Ph: [phone], Fax: [phone]    Prescriptions: PRAVASTATIN SODIUM 40 MG TABS (PRAVASTATIN SODIUM) Take one tablet by mouth daily at bedtime  #30 x 12   Entered by:   Deliah Goody, RN   Authorized by:   Colon Branch, MD, [facility]   Signed by:   Deliah Goody, RN on 05/19/2010   Method used:   Electronically to        [facility] [address] (retail)       [phone]       [place], [place]  [address]       Ph: [phone]       Fax: [phone]   RxID:   [id number]

## 2010-09-23 NOTE — Assessment & Plan Note (Signed)
Summary: 4 MTH FU/NS/KDC   Vital Signs:  Patient profile:   77 year old male Height:      69 inches Weight:      190.2 pounds BMI:     28.19 Pulse rate:   70 / minute BP sitting:   122 / 80  Vitals Entered By: Shary Decamp (August 26, 2009 8:51 AM) CC: rov - not fasting Is Patient Diabetic? Yes Comments FBS -- [address]  August 26, 2009 8:56 AM    History of Present Illness: ROV, doing well. In the last few months, he has experienced bilateral wrist pain and his fingers feel puffy the first 3 hours of the day.  No other joints involved.  Current Medications (verified): 1)  Metoprolol Tartrate 25 Mg  Tabs (Metoprolol Tartrate) .[name].. 1 Tab By Mouth Two Times A Day 2)  Cozaar 25 Mg Tabs (Losartan Potassium) .... Take One Tablet By Mouth Daily 3)  Vytorin 10-80 Mg  Tabs (Ezetimibe-Simvastatin) .[name].. 1 By Mouth Qhs 4)  Plavix 75 Mg  Tabs (Clopidogrel Bisulfate) .... Take 1 Tablet By Mouth Once A Day 5)  Isosorbide 25mg  .... Take 1 Tablet By Mouth Once A Day 6)  Nitroglycerin 0.4 Mg  Subl (Nitroglycerin) .[name].. 1 Sl  Every 5 Min As Needed Chest Pain X 3 Tabs 7)  Ventolin Hfa 108 (90 Base) Mcg/act  Aers (Albuterol Sulfate) .... 2 Puff Every 6 Hours As Needed 8)  Advair Diskus 100-50 Mcg/dose Misc (Fluticasone-Salmeterol) .[name].. 1 Puff Two Times A Day 9)  Aspirin 325 Mg  Tabs (Aspirin) .... Take 1 Tablet By Mouth Once A Day 10)  Antivert 25 Mg Tabs (Meclizine Hcl) .[name].. 1 By Mouth Qid As Needed Dizziness 11)  Glucophage 500 Mg Tabs (Metformin Hcl) .[name].. 1 By Mouth Once Daily. 12)  Freestyle Lite Test  Strp (Glucose Blood) .... Check Bs 1x/day Dx 250.00 13)  Freestyle Lite Lancets .... Checks Bs 1x/day Dx 250.00  Allergies (verified): No Known Drug Allergies  Past History:  Past Medical History:    COPD--asthma    Coronary artery disease,s/p MI 1992    Diabetes mellitus, type II    Hyperlipidemia    Allergic Rhinitis    Hx of AORTIC DISSECTION--f/u by cards  Review of  Systems        his ambulatory blood sugars are around 103 to 136 in the morning. His diet has not been as good in the last few weeks, but he plans to go back to a healthier diet. Planning to join an e exercise club  soon CV:  Denies chest pain or discomfort and shortness of breath with exertion. Resp:  rarely wheezing, runned out of advair . GI:  Denies diarrhea, vomiting, and vomiting blood.  Physical Exam  General:  alert and well-developed.   Lungs:  normal respiratory effort, no intercostal retractions, no accessory muscle use, and normal breath sounds.   Heart:  normal rate, regular rhythm, and no murmur.   Extremities:  no lower extremity edema. wrist  are symmetric, no puffy Hands normal to inspection and palpation. No SQ nodules @  the upper extremities Psych:  Cognition and judgment appear intact. Alert and cooperative with normal attention span and concentration.    Impression & Recommendations:  Problem # 1:  COPD (ICD-496) RF,stable His updated medication list for this problem includes:    Ventolin Hfa 108 (90 Base) Mcg/act Aers (Albuterol sulfate) .[name]... 2 puff every 6 hours as needed  Advair Diskus 100-50 Mcg/dose Misc (Fluticasone-salmeterol) .[name]... 1 puff two times a day  Problem # 2:  DIABETES MELLITUS, TYPE II (ICD-250.00) too early to recheck an A1C, will do on RTC Encoueraged diet-exercise Saw eye doctor 06-2009 His updated medication list for this problem includes:    Cozaar 25 Mg Tabs (Losartan potassium) .[name]... Take one tablet by mouth daily    Aspirin 325 Mg Tabs (Aspirin) .[name]... Take 1 tablet by mouth once a day    Glucophage 500 Mg Tabs (Metformin hcl) .[name]... 1 by mouth once daily.  Problem # 3:  CORONARY ARTERY DISEASE (ICD-414.00) asx His updated medication list for this problem includes:    Metoprolol Tartrate 25 Mg Tabs (Metoprolol tartrate) .[name]... 1 tab by mouth two times a day    Cozaar 25 Mg Tabs (Losartan potassium) .[name]... Take one tablet by  mouth daily    Plavix 75 Mg Tabs (Clopidogrel bisulfate) .[name]... Take 1 tablet by mouth once a day    Nitroglycerin 0.4 Mg Subl (Nitroglycerin) .[name]... 1 sl  every 5 min as needed chest pain x 3 tabs    Aspirin 325 Mg Tabs (Aspirin) .[name]... Take 1 tablet by mouth once a day  Labs Reviewed: Chol: 103 (04/23/2009)   HDL: 32.30 (04/23/2009)   LDL: 51 (04/23/2009)   TG: 97.0 (04/23/2009)  Problem # 4:  wrist pain  exam normal, without synovitis. Observe. Patient will call if symptoms increase  Complete Medication List: 1)  Metoprolol Tartrate 25 Mg Tabs (Metoprolol tartrate) .[name].. 1 tab by mouth two times a day 2)  Cozaar 25 Mg Tabs (Losartan potassium) .... Take one tablet by mouth daily 3)  Vytorin 10-80 Mg Tabs (Ezetimibe-simvastatin) .[name].. 1 by mouth qhs 4)  Plavix 75 Mg Tabs (Clopidogrel bisulfate) .... Take 1 tablet by mouth once a day 5)  Isosorbide 25mg   .... Take 1 tablet by mouth once a day 6)  Nitroglycerin 0.4 Mg Subl (Nitroglycerin) .[name].. 1 sl  every 5 min as needed chest pain x 3 tabs 7)  Ventolin Hfa 108 (90 Base) Mcg/act Aers (Albuterol sulfate) .... 2 puff every 6 hours as needed 8)  Advair Diskus 100-50 Mcg/dose Misc (Fluticasone-salmeterol) .[name].. 1 puff two times a day 9)  Aspirin 325 Mg Tabs (Aspirin) .... Take 1 tablet by mouth once a day 10)  Antivert 25 Mg Tabs (Meclizine hcl) .[name].. 1 by mouth qid as needed dizziness 11)  Glucophage 500 Mg Tabs (Metformin hcl) .[name].. 1 by mouth once daily. 12)  Freestyle Lite Test Strp (Glucose blood) .... Check bs 1x/day dx 250.00 13)  Freestyle Lite Lancets  .... Checks bs 1x/day dx 250.00  Patient Instructions: 1)  Please schedule a follow-up appointment in 3 months .  Prescriptions: VENTOLIN HFA 108 (90 BASE) MCG/ACT  AERS (ALBUTEROL SULFATE) 2 puff every 6 hours as needed  #1 x 6   Entered and Authorized by:   Elita Quick E. [name]   Signed by:   [name]. [name] on 08/26/2009   Method used:   Electronically to        [facility] [address]  [place] (retail)       [phone]       [place], [place]  [address]       Ph: [phone]       Fax: [phone]   RxID:   [id number] GLUCOPHAGE 500 MG TABS (METFORMIN HCL) 1 by mouth once daily.  #90 x 3  Entered by:   Shary Decamp   Authorized by:   [name]. [name]   Signed by:   Shary Decamp on 08/26/2009   Method used:   Electronically to        [facility] [address] [place] (retail)       [phone]       [place], [place]  [address]       Ph: [phone]       Fax: [phone]   RxID:   [id number] ADVAIR DISKUS 100-50 MCG/DOSE MISC (FLUTICASONE-SALMETEROL) 1 puff two times a day  #3 x 3   Entered by:   Shary Decamp   Authorized by:   [name]. [name]   Signed by:   Shary Decamp on 08/26/2009   Method used:   Electronically to        [facility] [address] [place] (retail)       [phone]       [place], [place]  [address]       Ph: [phone]       Fax: [phone]   RxID:   [id number] VYTORIN 10-80 MG  TABS (EZETIMIBE-SIMVASTATIN) 1 by mouth qhs  #90 x 3   Entered by:   Shary Decamp   Authorized by:   [name]. [name]   Signed by:   Shary Decamp on 08/26/2009   Method used:   Electronically to        [facility] [address] [place] (retail)       [phone]       [place], [place]  [address]       Ph: [phone]       Fax: [phone]   RxID:   [id number]

## 2010-09-23 NOTE — Progress Notes (Signed)
Summary: REFILL REQUEST  Phone Note Refill Request Message from:  Patient on February 26, 2010 3:45 PM  Refills Requested: Medication #1:  FREESTYLE LITE TEST  STRP check bs 1x/day dx 250.00   Dosage confirmed as above?Dosage Confirmed   Supply Requested: 3 months  Medication #2:  FREESTYLE LITE LANCETS checks bs 1x/day dx 250.00.   Dosage confirmed as above?Dosage Confirmed   Supply Requested: 3 months [facility]  Next Appointment Scheduled: SEPT 1ST 2011 Initial call taken by: Lavell Islam,  February 26, 2010 3:45 PM    Prescriptions: FREESTYLE LITE TEST  STRP (GLUCOSE BLOOD) check bs 1x/day dx 250.00  #3 mo supply x 3   Entered by:   Army Fossa CMA   Authorized by:   [name]. [name]   Signed by:   Army Fossa CMA on 02/26/2010   Method used:   Faxed to ...       [facility] (mail-order)             , [place]         Ph: [phone]       Fax: [phone]   RxID:   [id number] FREESTYLE LITE LANCETS checks bs 1x/day dx 250.00  #3 mo supply x 3   Entered by:   Army Fossa CMA   Authorized by:   [name]. [name]   Signed by:   Army Fossa CMA on 02/26/2010   Method used:   Faxed to ...       [facility] (mail-order)             , [place]         Ph: [phone]       Fax: [phone]   RxID:   [id number]

## 2010-09-23 NOTE — Assessment & Plan Note (Signed)
Summary: 3 MTH FU/NS/KDC   Vital Signs:  Patient profile:   77 year old male Height:      69 inches Weight:      174.4 pounds BMI:     25.85 Pulse rate:   58 / minute BP sitting:   102 / 60  Vitals Entered By: Shary Decamp (November 25, 2009 8:39 AM) CC: rov - not fasting, no concerns Comments  - [facility], [facility]11 [facility] [place]  November 25, 2009 8:42 AM    History of Present Illness: COPD--asthma, stable no recent problems    Diabetes-- avbg 99-115     Hyperlipidemia--  has increased his exercise, eating a lot healthier , + wt loss   found to have microhematuria, to see urology tomorrow  CAD  to see  cards in 5-11  [facility]: 09/06/09 total cholesterol 126, triglycerides 181, HDL 33, LDL 57 creatinine 1.1, glucose 119, potassium 4.7, calcium 9.5, LFTs normal hemoglobin A1c 6.8 CBC showed white count of 11.1, hemoglobin 1448, platelets 258  Current Medications (verified): 1)  Metoprolol Tartrate 25 Mg  Tabs (Metoprolol Tartrate) .[name].. 1 Tab By Mouth Two Times A Day 2)  Cozaar 25 Mg Tabs (Losartan Potassium) .... Take One Tablet By Mouth Daily 3)  Vytorin 10-80 Mg  Tabs (Ezetimibe-Simvastatin) .[name].. 1 By Mouth Qhs 4)  Plavix 75 Mg  Tabs (Clopidogrel Bisulfate) .... Take 1 Tablet By Mouth Once A Day 5)  Isosorbide 25mg  .... Take 1 Tablet By Mouth Once A Day 6)  Nitroglycerin 0.4 Mg  Subl (Nitroglycerin) .[name].. 1 Sl  Every 5 Min As Needed Chest Pain X 3 Tabs 7)  Ventolin Hfa 108 (90 Base) Mcg/act  Aers (Albuterol Sulfate) .... 2 Puff Every 6 Hours As Needed 8)  Advair Diskus 100-50 Mcg/dose Misc (Fluticasone-Salmeterol) .[name].. 1 Puff Two Times A Day 9)  Aspirin 325 Mg  Tabs (Aspirin) .... Take 1 Tablet By Mouth Once A Day 10)  Antivert 25 Mg Tabs (Meclizine Hcl) .[name].. 1 By Mouth Qid As Needed Dizziness 11)  Glucophage 500 Mg Tabs (Metformin Hcl) .[name].. 1 By Mouth Once Daily. 12)  Freestyle Lite Test  Strp (Glucose Blood)  .... Check Bs 1x/day Dx 250.00 13)  Freestyle Lite Lancets .... Checks Bs 1x/day Dx 250.00  Allergies (verified): No Known Drug Allergies  Past History:  Past Medical History: Reviewed history from 08/26/2009 and no changes required.    COPD--asthma    Coronary artery disease,s/p MI 1992    Diabetes mellitus, type II    Hyperlipidemia    Allergic Rhinitis    Hx of AORTIC DISSECTION--f/u by cards  Past Surgical History: Reviewed history from 01/09/2009 and no changes required. Back surgery, 1993   Left heart catheterization with coronary angiography and left ventriculography.  Percutaneous transluminal coronary angioplasty with placement of a drugeluting stent in the distal right coronary artery.. Percutaneous transluminal coronary angioplasty with placement of a drugeluting stent in the proximal right coronary artery.CARDIOLOGIST:  Carole Binning, M.D.  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  (quit 1994) pt is retired from [employer]. Alcohol use-no Drug use-no Regular exercise-- yes, joined a exercise group! diet-- improved! caffeine use - coffee (2 cups daily)  Review of Systems CV:  Denies chest pain or discomfort, palpitations, and swelling of feet. Resp:  Denies cough and shortness of breath. GU:  Denies  dysuria, urinary frequency, and urinary hesitancy; no back pain or fever.  Physical Exam  General:  alert, well-developed, and well-nourished.   Lungs:  normal respiratory effort, no intercostal retractions, no accessory muscle use, and normal breath sounds.   Heart:  normal rate, regular rhythm, and no murmur.   Extremities:  no lower ext.  edema Psych:  Oriented X3, memory intact for recent and remote, normally interactive, good eye contact, not anxious appearing, and not depressed appearing.  good spirits   Impression & Recommendations:  Problem # 1:  HYPERCHOLESTEROLEMIA (ICD-272.0) very well  controlled, changed  his diet and exercises  more for the last two months labs  09/06/09 total cholesterol 126, triglycerides 181, HDL 33, LDL 57   His updated medication list for this problem includes:    Vytorin 10-80 Mg Tabs (Ezetimibe-simvastatin) .[name]... 1 by mouth qhs  Problem # 2:  CORONARY ARTERY DISEASE (ICD-414.00) asymptomatic His updated medication list for this problem includes:    Metoprolol Tartrate 25 Mg Tabs (Metoprolol tartrate) .[name]... 1 tab by mouth two times a day    Cozaar 25 Mg Tabs (Losartan potassium) .[name]... Take one tablet by mouth daily    Plavix 75 Mg Tabs (Clopidogrel bisulfate) .[name]... Take 1 tablet by mouth once a day    Nitroglycerin 0.4 Mg Subl (Nitroglycerin) .[name]... 1 sl  every 5 min as needed chest pain x 3 tabs    Aspirin 325 Mg Tabs (Aspirin) .[name]... Take 1 tablet by mouth once a day  Problem # 3:  COPD (ICD-496) asymptomatic, doing well His updated medication list for this problem includes:    Ventolin Hfa 108 (90 Base) Mcg/act Aers (Albuterol sulfate) .[name]... 2 puff every 6 hours as needed    Advair Diskus 100-50 Mcg/dose Misc (Fluticasone-salmeterol) .[name]... 1 puff two times a day  Problem # 4:  DIABETES MELLITUS, TYPE II (ICD-250.00) 09/06/09--- hemoglobin A1c 6.8 doing very well very healthy lifestyle at the present His updated medication list for this problem includes:    Cozaar 25 Mg Tabs (Losartan potassium) .[name]... Take one tablet by mouth daily    Aspirin 325 Mg Tabs (Aspirin) .[name]... Take 1 tablet by mouth once a day    Glucophage 500 Mg Tabs (Metformin hcl) .[name]... 1 by mouth once daily.  Problem # 5:  microscopic hematuria Assessment: New to see urology tomorrow at [facility]  Complete Medication List: 1)  Metoprolol Tartrate 25 Mg Tabs (Metoprolol tartrate) .[name].. 1 tab by mouth two times a day 2)  Cozaar 25 Mg Tabs (Losartan potassium) .... Take one tablet by mouth daily 3)  Vytorin 10-80 Mg Tabs (Ezetimibe-simvastatin) .[name].. 1 by mouth qhs 4)  Plavix 75  Mg Tabs (Clopidogrel bisulfate) .... Take 1 tablet by mouth once a day 5)  Isosorbide 25mg   .... Take 1 tablet by mouth once a day 6)  Nitroglycerin 0.4 Mg Subl (Nitroglycerin) .[name].. 1 sl  every 5 min as needed chest pain x 3 tabs 7)  Ventolin Hfa 108 (90 Base) Mcg/act Aers (Albuterol sulfate) .... 2 puff every 6 hours as needed 8)  Advair Diskus 100-50 Mcg/dose Misc (Fluticasone-salmeterol) .[name].. 1 puff two times a day 9)  Aspirin 325 Mg Tabs (Aspirin) .... Take 1 tablet by mouth once a day 10)  Antivert 25 Mg Tabs (Meclizine hcl) .[name].. 1 by mouth qid as needed dizziness 11)  Glucophage 500 Mg Tabs (Metformin hcl) .[name].. 1 by mouth once daily. 12)  Freestyle Lite Test Strp (Glucose blood) .... Check bs  1x/day dx 250.00 13)  Freestyle Lite Lancets  .... Checks bs 1x/day dx 250.00  Patient Instructions: 1)  Please schedule a follow-up appointment in 6 months (fasting- yearly check up) Prescriptions: ISOSORBIDE 25MG  Take 1 tablet by mouth once a day  #90 x 1   Entered by:   Shary Decamp   Authorized by:   [name]. [name]   Signed by:   Shary Decamp on 11/25/2009   Method used:   Print then Give to Patient   RxID:   [id number] GLUCOPHAGE 500 MG TABS (METFORMIN HCL) 1 by mouth once daily.  #90 x 1   Entered by:   Shary Decamp   Authorized by:   [name]. [name]   Signed by:   Shary Decamp on 11/25/2009   Method used:   Print then Give to Patient   RxID:   [id number] COZAAR 25 MG TABS (LOSARTAN POTASSIUM) Take one tablet by mouth daily  #90 x 1   Entered by:   Shary Decamp   Authorized by:   [name]. [name]   Signed by:   Shary Decamp on 11/25/2009   Method used:   Print then Give to Patient   RxID:   [id number]

## 2010-09-23 NOTE — Progress Notes (Signed)
Summary: REFILL REQUEST  Phone Note Refill Request Call back at [phone] Message from:  Fax from Pharmacy on May 09, 2010 1:36 PM  Refills Requested: Medication #1:  METOPROLOL TARTRATE 25 MG  TABS 1 tab by mouth two times a day   Dosage confirmed as above?Dosage Confirmed   Supply Requested: 1 month [facility] [address]  Initial call taken by: Lavell Islam,  May 09, 2010 1:37 PM    Prescriptions: METOPROLOL TARTRATE 25 MG  TABS (METOPROLOL TARTRATE) 1 tab by mouth two times a day  #30 x 1   Entered by:   Army Fossa CMA   Authorized by:   [name]. [name]   Signed by:   Army Fossa CMA on 05/09/2010   Method used:   Electronically to        [facility] [address] (retail)       [phone]       [place], [place]  [address]       Ph: [phone]       Fax: [phone]   RxID:   [id number]

## 2010-09-23 NOTE — Progress Notes (Signed)
   Faxed ROI over to [facility]..fax [phone] [facility]  Dec 23, 2009 12:27 PM    Appended Document:  Recieved Records from [place].Marland KitchenMarland KitchenSent to [employer]

## 2010-09-23 NOTE — Progress Notes (Signed)
Summary: CALLING REGARDING CT SCAN   Phone Note Call from Patient Call back at Home Phone [phone]   Caller: Spouse/ PASTY Summary of Call: PT WIFE CALLING REGARDING THE CT SCAN THAT WAS DONE AT THE [facility] Initial call taken by: Judie Grieve,  Jan 06, 2010 2:41 PM  Follow-up for Phone Call        spoke with pat, she wanted to let us know the pt had his ct scan at the [facility]. report is in pts chart. will wait for dr [name] to review Deliah Goody, RN  Jan 06, 2010 4:05 PM

## 2010-09-23 NOTE — Letter (Signed)
Summary: + retinopathy, f/u  one year----[facility]  [facility]   Imported By: Lanelle Bal 06/10/2010 15:44:23  _____________________________________________________________________  External Attachment:    Type:   Image     Comment:   External Document

## 2010-09-23 NOTE — Progress Notes (Signed)
Summary: lab result  Phone Note Call from Patient Call back at Work Phone [phone]   Caller: Patient Summary of Call: [phone] - would like to take copy of lab - he will pick up - can dr Drue Novel review  Initial call taken by: Okey Regal [place],  May 01, 2010 9:35 AM  Follow-up for Phone Call        see labs  Follow-up by: [facility] E. Elizabeth Paulsen MD,  May 01, 2010 12:04 PM

## 2010-09-23 NOTE — Assessment & Plan Note (Signed)
Summary: F/U 1 YEAR /CY  Medications Added ISOSORBIDE MONONITRATE CR 60 MG XR24H-TAB (ISOSORBIDE MONONITRATE) Take one tablet by mouth daily      Allergies Added: NKDA  Primary Provider:  [name]. [name]   History of Present Illness: Phillip Jordan seen today in followup for his coronary artery disease, AAA  with distal dissection, and some mild shortness of breath.  He also has a chronic right bundle branch block.  He has not any significant chest pain.  His last Myoview was Dec 29, 2007 there is a fixed defect in the inferior wall suggesting possible previous infarction with no really significant ischemia.  His EF was 69%.  He's had previous stents to the RCA and circumflex.  His last catheter was in July of 2005.  At that time he received 2 stents to the RCA.  The circumflex coronary artery stent was done prior to 2005.  He is active with his grandchildren. .  He has no angiina PND orthopnea no palpitations or syncope.    He indicates that Dr Rush Landmark at the [facility] did a CT on him last month.  It does not sound like he had contrast though as his metformin was not held.  We will get this report and if it was a non-contrast scanhe will need a CTA here to follow his disection and AAA  Current Problems (verified): 1)  Hyperlipidemia  (ICD-272.4) 2)  Coronary Artery Disease  (ICD-414.00) 3)  COPD  (ICD-496) 4)  Asthma  (ICD-493.90) 5)  Diabetes Mellitus, Type II  (ICD-250.00) 6)  Aortic Dissection  (ICD-441.00) 7)  Special Screening Malignant Neoplasm of Prostate  (ICD-V76.44) 8)  Bundle Branch Block, Right  (ICD-426.4) 9)  Hx of Aortic Dissection  (ICD-441.00) 10)  Allergic Rhinitis  (ICD-477.9) 11)  Health Screening  (ICD-V70.0)  Current Medications (verified): 1)  Metoprolol Tartrate 25 Mg  Tabs (Metoprolol Tartrate) .[name].. 1 Tab By Mouth Two Times A Day 2)  Cozaar 25 Mg Tabs (Losartan Potassium) .... Take One Tablet By Mouth Daily 3)  Vytorin 10-80 Mg  Tabs (Ezetimibe-Simvastatin) .[name].. 1 By  Mouth Qhs 4)  Plavix 75 Mg  Tabs (Clopidogrel Bisulfate) .... Take 1 Tablet By Mouth Once A Day 5)  Isosorbide Mononitrate Cr 60 Mg Xr24h-Tab (Isosorbide Mononitrate) .... Take One Tablet By Mouth Daily 6)  Nitroglycerin 0.4 Mg  Subl (Nitroglycerin) .[name].. 1 Sl  Every 5 Min As Needed Chest Pain X 3 Tabs 7)  Ventolin Hfa 108 (90 Base) Mcg/act  Aers (Albuterol Sulfate) .... 2 Puff Every 6 Hours As Needed 8)  Advair Diskus 100-50 Mcg/dose Misc (Fluticasone-Salmeterol) .[name].. 1 Puff Two Times A Day 9)  Aspirin 325 Mg  Tabs (Aspirin) .... Take 1 Tablet By Mouth Once A Day 10)  Antivert 25 Mg Tabs (Meclizine Hcl) .[name].. 1 By Mouth Qid As Needed Dizziness 11)  Glucophage 500 Mg Tabs (Metformin Hcl) .[name].. 1 By Mouth Once Daily. 12)  Freestyle Lite Test  Strp (Glucose Blood) .... Check Bs 1x/day Dx 250.00 13)  Freestyle Lite Lancets .... Checks Bs 1x/day Dx 250.00  Allergies (verified): No Known Drug Allergies  Past History:  Past Medical History: Last updated: 08/26/2009    COPD--asthma    Coronary artery disease,s/p MI 1992    Diabetes mellitus, type II    Hyperlipidemia    Allergic Rhinitis    Hx of AORTIC DISSECTION--f/u by cards  Past Surgical History: Last updated: 01/09/2009 Back surgery, 1993   Left heart catheterization with coronary angiography  and left ventriculography.  Percutaneous transluminal coronary angioplasty with placement of a drugeluting stent in the distal right coronary artery.. Percutaneous transluminal coronary angioplasty with placement of a drugeluting stent in the proximal right coronary artery.CARDIOLOGIST:  Carole Binning, M.D.  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  (quit 1994) pt is retired from [employer]. Alcohol  use-no Drug use-no Regular exercise-- yes, joined a exercise group! diet-- improved! caffeine use - coffee (2 cups daily)  Review of Systems       Denies fever, malais, weight loss, blurry vision, decreased visual acuity, cough, sputum, SOB, hemoptysis, pleuritic pain, palpitaitons, heartburn, abdominal pain, melena, lower extremity edema, claudication, or rash.   Vital Signs:  Patient profile:   77 year old male Height:      69 inches Weight:      173 pounds BMI:     25.64 Pulse rate:   58 / minute Resp:     14 per minute BP sitting:   115 / 72  (left arm)  Vitals Entered By: Kem Parkinson (Dec 23, 2009 11:59 AM)  Physical Exam  General:  Affect appropriate Healthy:  appears stated age HEENT: normal Neck supple with no adenopathy JVP normal no bruits no thyromegaly Lungs clear with no wheezing and good diaphragmatic motion Heart:  S1/S2 no murmur,rub, gallop or click PMI normal Abdomen: benighn, BS positve, no tenderness, no AAA no bruit.  No HSM or HJR Distal pulses intact with no bruits No edema Neuro non-focal Skin warm and dry    Impression & Recommendations:  Problem # 1:  HYPERLIPIDEMIA (ICD-272.4) At goal with no side effects His updated medication list for this problem includes:    Vytorin 10-80 Mg Tabs (Ezetimibe-simvastatin) .[name]... 1 by mouth qhs  CHOL: 126 (09/06/2009)   LDL: 57 (09/06/2009)   HDL: 33 (09/06/2009)   TG: 181 (09/06/2009)  Problem # 2:  CORONARY ARTERY DISEASE (ICD-414.00) Stable no angina.  Continue antiplatlets and BB His updated medication list for this problem includes:    Metoprolol Tartrate 25 Mg Tabs (Metoprolol tartrate) .[name]... 1 tab by mouth two times a day    Plavix 75 Mg Tabs (Clopidogrel bisulfate) .[name]... Take 1 tablet by mouth once a day    Isosorbide Mononitrate Cr 60 Mg Xr24h-tab (Isosorbide mononitrate) .[name]... Take one tablet by mouth daily    Nitroglycerin 0.4 Mg Subl (Nitroglycerin) .[name]... 1 sl  every 5 min as needed  chest pain x 3 tabs    Aspirin 325 Mg Tabs (Aspirin) .[name]... Take 1 tablet by mouth once a day  Problem # 3:  COPD (ICD-496) Minimal dyspnea.  No wheezing on exam His updated medication list for this problem includes:    Ventolin Hfa 108 (90 Base) Mcg/act Aers (Albuterol sulfate) .[name]... 2 puff every 6 hours as needed    Advair Diskus 100-50 Mcg/dose Misc (Fluticasone-salmeterol) .[name]... 1 puff two times a day  Problem # 4:  AORTIC DISSECTION (ICD-441.00) F/U CT if not done at the [place] .No abdominal pain or palpable mass on exam  Problem # 5:  BUNDLE BRANCH BLOCK, RIGHT (ICD-426.4) No change and no evidence of high grade heart block His updated medication list for this problem includes:    Metoprolol Tartrate 25 Mg Tabs (Metoprolol tartrate) .[name]... 1 tab by mouth two times  a day    Plavix 75 Mg Tabs (Clopidogrel bisulfate) .[name]... Take 1 tablet by mouth once a day    Isosorbide Mononitrate Cr 60 Mg Xr24h-tab (Isosorbide mononitrate) .[name]... Take one tablet by mouth daily    Nitroglycerin 0.4 Mg Subl (Nitroglycerin) .[name]... 1 sl  every 5 min as needed chest pain x 3 tabs    Aspirin 325 Mg Tabs (Aspirin) .[name]... Take 1 tablet by mouth once a day   Patient Instructions: 1)  Your physician recommends that you schedule a follow-up appointment in: 6 MONTHS   EKG Report  Procedure date:  12/23/2009  Findings:      NSR 58 RBBB Abnormal ECG

## 2010-09-23 NOTE — Letter (Signed)
Summary: Letter to Patient with Lab Results/[facility]  Letter to Patient with Lab Results/[facility]   Imported By: Lanelle Bal 12/20/2009 10:53:17  _____________________________________________________________________  External Attachment:    Type:   Image     Comment:   External Document

## 2010-09-26 ENCOUNTER — Ambulatory Visit: Admit: 2010-09-26 | Payer: Self-pay | Admitting: Cardiovascular Disease

## 2010-09-26 ENCOUNTER — Other Ambulatory Visit: Payer: Self-pay | Admitting: Cardiovascular Disease

## 2010-09-26 ENCOUNTER — Telehealth: Payer: Self-pay | Admitting: Cardiovascular Disease

## 2010-09-26 ENCOUNTER — Ambulatory Visit (INDEPENDENT_AMBULATORY_CARE_PROVIDER_SITE_OTHER): Payer: Medicare PPO | Admitting: Cardiovascular Disease

## 2010-09-26 ENCOUNTER — Encounter (INDEPENDENT_AMBULATORY_CARE_PROVIDER_SITE_OTHER): Payer: Medicare PPO

## 2010-09-26 ENCOUNTER — Ambulatory Visit: Admit: 2010-09-26 | Payer: Self-pay

## 2010-09-26 ENCOUNTER — Encounter: Payer: Self-pay | Admitting: Cardiovascular Disease

## 2010-09-26 DIAGNOSIS — I714 Abdominal aortic aneurysm, without rupture: Secondary | ICD-10-CM

## 2010-09-26 DIAGNOSIS — I700 Atherosclerosis of aorta: Secondary | ICD-10-CM

## 2010-09-26 DIAGNOSIS — E785 Hyperlipidemia, unspecified: Secondary | ICD-10-CM

## 2010-09-26 DIAGNOSIS — I2510 Atherosclerotic heart disease of native coronary artery without angina pectoris: Secondary | ICD-10-CM

## 2010-09-26 DIAGNOSIS — I10 Essential (primary) hypertension: Secondary | ICD-10-CM

## 2010-09-26 LAB — HEPATIC FUNCTION PANEL
ALT: 17 U/L (ref 0–53)
Alkaline Phosphatase: 96 U/L (ref 39–117)
Bilirubin, Direct: 0.1 mg/dL (ref 0.0–0.3)
Total Bilirubin: 0.4 mg/dL (ref 0.3–1.2)

## 2010-09-26 LAB — LIPID PANEL: VLDL: 24 mg/dL (ref 0.0–40.0)

## 2010-09-29 ENCOUNTER — Encounter (INDEPENDENT_AMBULATORY_CARE_PROVIDER_SITE_OTHER): Payer: Self-pay | Admitting: *Deleted

## 2010-10-01 NOTE — Assessment & Plan Note (Signed)
Summary: [name] appt confirm wpt wife=mj   Vital Signs:  Patient profile:   77 year old male Height:      69 inches Weight:      170 pounds BMI:     25.20 Pulse rate:   67 / minute BP sitting:   134 / 60  (left arm) Cuff size:   large  Vitals Entered By: Burnett Kanaris, CNA (September 26, 2010 9:10 AM)  Visit Type:  Follow-up Primary Provider:  [name]. [name]  CC:  overall pt feeling ok.  History of Present Illness: Phillip Jordan seen today in followup for his coronary artery disease, AAA  with distal dissection, and some mild shortness of breath.  He also has a chronic right bundle branch block.  He has not any significant chest pain.  His last Myoview was Dec 29, 2007 there is a fixed defect in the inferior wall suggesting possible previous infarction with no really significant ischemia.  His EF was 69%.  He's had previous stents to the RCA and circumflex.  His last catheter was in July of 2005.  At that time he received 2 stents to the RCA.  The circumflex coronary artery stent was done prior to 2005.  He is active with his grandchildren and silver sneakers at the [facility]. [name]  He has no angiina PND orthopnea no palpitations or syncope.    Needs a fasting lipid and liver today.    Reviewed Abdominal US from today and Ao only 2.3 by 2.5 cm and distal disection not visible with no false lumen    Current Problems (verified): 1)  Hyperlipidemia  (ICD-272.4) 2)  Coronary Artery Disease  (ICD-414.00) 3)  COPD  (ICD-496) 4)  Asthma  (ICD-493.90) 5)  Diabetes Mellitus, Type II  (ICD-250.00) 6)  Aortic Dissection  (ICD-441.00) 7)  Special Screening Malignant Neoplasm of Prostate  (ICD-V76.44) 8)  Bundle Branch Block, Right  (ICD-426.4) 9)  Hx of Aortic Dissection  (ICD-441.00) 10)  Allergic Rhinitis  (ICD-477.9) 11)  Health Screening  (ICD-V70.0)  Current Medications (verified): 1)  Metoprolol Tartrate 25 Mg  Tabs (Metoprolol Tartrate) .[name].. 1 Tab By Mouth Two Times A  Day 2)  Pravastatin Sodium 40 Mg Tabs (Pravastatin Sodium) .... Take One Tablet By Mouth Daily At Bedtime 3)  Plavix 75 Mg  Tabs (Clopidogrel Bisulfate) .... Take 1 Tablet By Mouth Once A Day 4)  Isosorbide Mononitrate Cr 60 Mg Xr24h-Tab (Isosorbide Mononitrate) .... Take One Tablet By Mouth Daily 5)  Nitroglycerin 0.4 Mg  Subl (Nitroglycerin) .[name].. 1 Sl  Every 5 Min As Needed Chest Pain X 3 Tabs 6)  Ventolin Hfa 108 (90 Base) Mcg/act  Aers (Albuterol Sulfate) .... 2 Puff Every 6 Hours As Needed 7)  Aspirin 325 Mg  Tabs (Aspirin) .... Take 1 Tablet By Mouth Once A Day 8)  Antivert 25 Mg Tabs (Meclizine Hcl) .[name].. 1 By Mouth Qid As Needed Dizziness 9)  Glucophage 500 Mg Tabs (Metformin Hcl) .[name].. 1 By Mouth Once Daily. 10)  Freestyle Lite Test  Strp (Glucose Blood) .... Check Bs 1x/day Dx 250.00 11)  Freestyle Lite Lancets .... Checks Bs 1x/day Dx 250.00 12)  Fish Oil .... Qd  Allergies (verified): No Known Drug Allergies  Past History:  Past Medical History: Last updated: 08/26/2009    COPD--asthma    Coronary artery disease,s/p MI 1992    Diabetes mellitus, type II    Hyperlipidemia    Allergic Rhinitis    Hx  of AORTIC DISSECTION--f/u by cards  Past Surgical History: Last updated: 01/09/2009 Back surgery, 1993   Left heart catheterization with coronary angiography and left ventriculography.  Percutaneous transluminal coronary angioplasty with placement of a drugeluting stent in the distal right coronary artery.. Percutaneous transluminal coronary angioplasty with placement of a drugeluting stent in the proximal right coronary artery.CARDIOLOGIST:  Carole Binning, M.D.  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  (quit 1994) pt is retired from [employer]. Alcohol use-no Drug use-no Regular exercise-- yes, joined a exercise group! diet-- improved! caffeine use - coffee (2 cups daily)  Review of Systems       Denies fever, malais, weight loss, blurry vision, decreased visual acuity, cough, sputum, SOB, hemoptysis, pleuritic pain, palpitaitons, heartburn, abdominal pain, melena, lower extremity edema, claudication, or rash.   Physical Exam  General:  Affect appropriate Healthy:  appears stated age HEENT: normal Neck supple with no adenopathy JVP normal no bruits no thyromegaly Lungs clear with no wheezing and good diaphragmatic motion Heart:  S1/S2 no murmur,rub, gallop or click PMI normal Abdomen: benighn, BS positve, no tenderness, no AAA no bruit.  No HSM or HJR Distal pulses intact with no bruits No edema Neuro non-focal Skin warm and dry    Impression & Recommendations:  Problem # 1:  HYPERLIPIDEMIA (ICD-272.4) No on pravastatin instead of Vytorin labs today His updated medication list for this problem includes:    Pravastatin Sodium 40 Mg Tabs (Pravastatin sodium) .[name]... Take one tablet by mouth daily at bedtime  Orders: TLB-Lipid Panel (80061-LIPID) TLB-Hepatic/Liver Function Pnl (80076-HEPATIC)  CHOL: 126 (09/06/2009)   LDL: 57 (09/06/2009)   HDL: 33 (09/06/2009)   TG: 181 (09/06/2009)  Problem # 2:  CORONARY ARTERY DISEASE (ICD-414.00) Stable no angina.  With history of distal disection contsider future caths from left wrist His updated medication list for this problem includes:    Metoprolol Tartrate 25 Mg Tabs (Metoprolol tartrate) .[name]... 1 tab by mouth two times a day    Plavix 75 Mg Tabs (Clopidogrel bisulfate) .[name]... Take 1 tablet by mouth once a day    Isosorbide Mononitrate Cr 60 Mg Xr24h-tab (Isosorbide mononitrate) .[name]... Take one tablet by mouth daily    Nitroglycerin 0.4 Mg Subl (Nitroglycerin) .[name]... 1 sl  every 5 min as needed chest pain x 3 tabs    Aspirin 325 Mg Tabs (Aspirin) .[name]...  Take 1 tablet by mouth once a day  Problem # 3:  AORTIC DISSECTION (ICD-441.00) Stable with no disection seen by [place] today.  Patient Instructions: 1)  Your physician recommends that you schedule a follow-up appointment in: 1 year 2)  Your physician recommends that you continue on your current medications as directed. Please refer to the Current Medication list given to you today.   Orders Added: 1)  TLB-Lipid Panel [80061-LIPID] 2)  TLB-Hepatic/Liver Function Pnl [80076-HEPATIC]

## 2010-10-09 NOTE — Progress Notes (Signed)
Summary: re adding lab   Phone Note Call from Patient   Caller: Patient [phone] Reason for Call: Talk to Nurse Summary of Call: pt's wife calling forgot to add potassium low, wondered if that was/or can be added to labs he had done today? Initial call taken by: Glynda Jaeger,  September 26, 2010 10:59 AM  Follow-up for Phone Call        message had been fowarded to [facility]. called and left a message for the pt that the potassium would not be normal if ran today. she will call back if labs needed Deliah Goody, RN  September 29, 2010 12:35 PM\par

## 2010-10-09 NOTE — Letter (Signed)
Summary: Custom - Lipid  [facility], Main Office  [address]   [place], [place] [address]   Phone: [phone]  Fax: [phone]     September 29, 2010 MRN: [id number]   Phillip Jordan [address] [place], [place]  [address]   Dear [name],  We have reviewed your cholesterol results.  They are as follows:     Total Cholesterol:    136 (Desirable: less than 200)       HDL  Cholesterol:     34.20  (Desirable: greater than 40 for men and 50 for women)       LDL Cholesterol:       78  (Desirable: less than 100 for low risk and less than 70 for moderate to high risk)       Triglycerides:       120.0  (Desirable: less than 150)  Our recommendations include:These numbers look good. Continue on the same medicine. Liver function is normal. Take care, Dr. Darel Hong.    Call our office at the number listed above if you have any questions.  Lowering your LDL cholesterol is important, but it is only one of a large number of "risk factors" that may indicate that you are at risk for heart disease, stroke or other complications of hardening of the arteries.  Other risk factors include:   A.  Cigarette Smoking* B.  High Blood Pressure* C.  Obesity* D.   Low HDL Cholesterol (see yours above)* E.   Diabetes Mellitus (higher risk if your is uncontrolled) F.  Family history of premature heart disease G.  Previous history of stroke or cardiovascular disease    *These are risk factors YOU HAVE CONTROL OVER.  For more information, visit .  There is now evidence that lowering the TOTAL CHOLESTEROL AND LDL CHOLESTEROL can reduce the risk of heart disease.  The American Heart Association recommends the following guidelines for the treatment of elevated cholesterol:  1.  If there is now current heart disease and less than two risk factors, TOTAL CHOLESTEROL should be less than 200 and LDL CHOLESTEROL should be less than 100. 2.  If there is current heart disease or  two or more risk factors, TOTAL CHOLESTEROL should be less than 200 and LDL CHOLESTEROL should be less than 70.  A diet low in cholesterol, saturated fat, and calories is the cornerstone of treatment for elevated cholesterol.  Cessation of smoking and exercise are also important in the management of elevated cholesterol and preventing vascular disease.  Studies have shown that 30 to 60 minutes of physical activity most days can help lower blood pressure, lower cholesterol, and keep your weight at a healthy level.  Drug therapy is used when cholesterol levels do not respond to therapeutic lifestyle changes (smoking cessation, diet, and exercise) and remains unacceptably high.  If medication is started, it is important to have you levels checked periodically to evaluate the need for further treatment options.  Thank you,    [employer] Team

## 2010-10-23 ENCOUNTER — Encounter: Payer: Self-pay | Admitting: Internal Medicine

## 2010-10-23 ENCOUNTER — Ambulatory Visit (INDEPENDENT_AMBULATORY_CARE_PROVIDER_SITE_OTHER): Payer: Medicare PPO | Admitting: Internal Medicine

## 2010-10-23 ENCOUNTER — Other Ambulatory Visit: Payer: Self-pay | Admitting: Internal Medicine

## 2010-10-23 DIAGNOSIS — E119 Type 2 diabetes mellitus without complications: Secondary | ICD-10-CM

## 2010-10-23 DIAGNOSIS — J45909 Unspecified asthma, uncomplicated: Secondary | ICD-10-CM

## 2010-10-23 DIAGNOSIS — J449 Chronic obstructive pulmonary disease, unspecified: Secondary | ICD-10-CM

## 2010-10-23 LAB — BASIC METABOLIC PANEL
BUN: 18 mg/dL (ref 6–23)
Calcium: 9.9 mg/dL (ref 8.4–10.5)
Creatinine, Ser: 0.9 mg/dL (ref 0.4–1.5)
GFR: 85.54 mL/min (ref >60.00)
Glucose, Bld: 74 mg/dL (ref 70–99)

## 2010-10-23 LAB — CBC WITH DIFFERENTIAL/PLATELET
Basophils Relative: 0.7 % (ref 0.0–3.0)
Eosinophils Relative: 4.4 % (ref 0.0–5.0)
HCT: 44.5 % (ref 39.0–52.0)
Lymphs Abs: 2 10*3/uL (ref 0.7–4.0)
MCV: 90.8 fl (ref 78.0–100.0)
Monocytes Absolute: 0.9 10*3/uL (ref 0.1–1.0)
Platelets: 244 10*3/uL (ref 150.0–400.0)
RBC: 4.9 Mil/uL (ref 4.22–5.81)
WBC: 8.5 10*3/uL (ref 4.5–10.5)

## 2010-10-29 ENCOUNTER — Ambulatory Visit (INDEPENDENT_AMBULATORY_CARE_PROVIDER_SITE_OTHER): Payer: Medicare PPO | Admitting: Internal Medicine

## 2010-10-29 ENCOUNTER — Encounter: Payer: Self-pay | Admitting: Internal Medicine

## 2010-10-29 DIAGNOSIS — J069 Acute upper respiratory infection, unspecified: Secondary | ICD-10-CM

## 2010-10-30 NOTE — Assessment & Plan Note (Signed)
Summary: 6 MONTH ROV/CBS   Vital Signs:  Patient profile:   77 year old male Weight:      171 pounds Pulse rate:   78 / minute Pulse rhythm:   regular BP sitting:   126 / 84  (left arm) Cuff size:   large  Vitals Entered By: Army Fossa CMA (October 23, 2010 10:09 AM) CC: 6 month f/u- not fasting  Comments no complaints right source - switching pharms    History of Present Illness: ROV no concerns   Diabetes -- cbg 115, 130, was 140 x 1 time    Hyperlipidemia-- last FLP satisfactory Hx of AORTIC DISSECTION--last u/s reviewed, stable, next in 2013   ROS reports he is using albuterol twice a day x 6 months used to take advair long a go, d/c b/c "it was not doing anything for me" long h/o feet , hand feeling cold, denies actual change in color or symptoms triggered by cold exposure   Current Medications (verified): 1)  Metoprolol Tartrate 25 Mg  Tabs (Metoprolol Tartrate) .[name].. 1 Tab By Mouth Two Times A Day 2)  Pravastatin Sodium 40 Mg Tabs (Pravastatin Sodium) .... Take One Tablet By Mouth Daily At Bedtime 3)  Plavix 75 Mg  Tabs (Clopidogrel Bisulfate) .... Take 1 Tablet By Mouth Once A Day 4)  Isosorbide Mononitrate Cr 60 Mg Xr24h-Tab (Isosorbide Mononitrate) .... Take One Tablet By Mouth Daily 5)  Nitroglycerin 0.4 Mg  Subl (Nitroglycerin) .[name].. 1 Sl  Every 5 Min As Needed Chest Pain X 3 Tabs 6)  Ventolin Hfa 108 (90 Base) Mcg/act  Aers (Albuterol Sulfate) .... 2 Puff Every 6 Hours As Needed 7)  Aspirin 325 Mg  Tabs (Aspirin) .... Take 1 Tablet By Mouth Once A Day 8)  Antivert 25 Mg Tabs (Meclizine Hcl) .[name].. 1 By Mouth Qid As Needed Dizziness 9)  Glucophage 500 Mg Tabs (Metformin Hcl) .[name].. 1 By Mouth Once Daily. 10)  Freestyle Lite Test  Strp (Glucose Blood) .... Check Bs 1x/day Dx 250.00 11)  Freestyle Lite Lancets .... Checks Bs 1x/day Dx 250.00 12)  Fish Oil .... Qd  Allergies (verified): No Known Drug Allergies  Past History:  Past Medical History: Reviewed  history from 08/26/2009 and no changes required.    COPD--asthma    Coronary artery disease,s/p MI 1992    Diabetes mellitus, type II    Hyperlipidemia    Allergic Rhinitis    Hx of AORTIC DISSECTION--f/u by cards  Past Surgical History: Reviewed history from 01/09/2009 and no changes required. Back surgery, 1993   Left heart catheterization with coronary angiography and left ventriculography.  Percutaneous transluminal coronary angioplasty with placement of a drugeluting stent in the distal right coronary artery.. Percutaneous transluminal coronary angioplasty with placement of a drugeluting stent in the proximal right coronary artery.CARDIOLOGIST:  Carole Binning, M.D.  Social History: Reviewed history from 04/24/2010 and no changes required. 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  (quit 1994) pt is retired from [employer]. Alcohol use-no Drug use-no Regular exercise-- yes, joined a exercise group! diet-- improved! caffeine use - coffee (2 cups daily)  Physical Exam  General:  alert, well-developed, and well-nourished.   Lungs:  normal respiratory effort, no intercostal retractions, no accessory muscle use, and normal breath sounds.   Heart:  normal rate, regular rhythm, no murmur, and no gallop.   Psych:  Oriented X3, good eye contact, not anxious appearing, and not  depressed appearing.     Impression & Recommendations:  Problem # 1:  ASTHMA (ICD-493.90)  using albuterol two times a day chart reviewed , has used a number of preventive meds before  spirometry confirms severe obstruction plan: qvar  explained goal to decrease albuterol to < 4/week His updated medication list for this problem includes:    Ventolin Hfa 108 (90 Base) Mcg/act Aers (Albuterol sulfate) .[name]... 2 puff every 6 hours as needed    Qvar 80 Mcg/act Aers (Beclomethasone dipropionate) .[name]... 1 puff two times a day  Orders: Spirometry w/Graph  (94010)  Problem # 2:  DIABETES MELLITUS, TYPE II (ICD-250.00)  + retinopathy per exam 05-2010 was rec to f/u 1 year His updated medication list for this problem includes:    Aspirin 325 Mg Tabs (Aspirin) .[name]... Take 1 tablet by mouth once a day    Glucophage 500 Mg Tabs (Metformin hcl) .[name]... 1 by mouth once daily.  Orders: Venipuncture (84132) TLB-A1C / Hgb A1C (Glycohemoglobin) (83036-A1C) TLB-CBC Platelet - w/Differential (85025-CBCD) TLB-BMP (Basic Metabolic Panel-BMET) (80048-METABOL) Specimen Handling (44010)  Complete Medication List: 1)  Metoprolol Tartrate 25 Mg Tabs (Metoprolol tartrate) .[name].. 1 tab by mouth two times a day 2)  Pravastatin Sodium 40 Mg Tabs (Pravastatin sodium) .... Take one tablet by mouth daily at bedtime 3)  Plavix 75 Mg Tabs (Clopidogrel bisulfate) .... Take 1 tablet by mouth once a day 4)  Isosorbide Mononitrate Cr 60 Mg Xr24h-tab (Isosorbide mononitrate) .... Take one tablet by mouth daily 5)  Nitroglycerin 0.4 Mg Subl (Nitroglycerin) .[name].. 1 sl  every 5 min as needed chest pain x 3 tabs 6)  Ventolin Hfa 108 (90 Base) Mcg/act Aers (Albuterol sulfate) .... 2 puff every 6 hours as needed 7)  Aspirin 325 Mg Tabs (Aspirin) .... Take 1 tablet by mouth once a day 8)  Antivert 25 Mg Tabs (Meclizine hcl) .[name].. 1 by mouth qid as needed dizziness 9)  Glucophage 500 Mg Tabs (Metformin hcl) .[name].. 1 by mouth once daily. 10)  Freestyle Lite Test Strp (Glucose blood) .... Check bs 1x/day dx 250.00 11)  Freestyle Lite Lancets  .... Checks bs 1x/day dx 250.00 12)  Fish Oil  .... Qd 13)  Qvar 80 Mcg/act Aers (Beclomethasone dipropionate) .[name].. 1 puff two times a day  Patient Instructions: 1)  Qvar 1 puff two times a day  2)  if you till use albuterol more than 4 times a week, let me know  3)  Please schedule a follow-up appointment in 4 months .  Prescriptions: QVAR 80 MCG/ACT AERS (BECLOMETHASONE DIPROPIONATE) 1 puff two times a day  #1 x 6   Entered and Authorized by:    Elita Quick E. Danica Camarena MD   Signed by:   [name]. Roslin Norwood MD on 10/23/2010   Method used:   Print then Give to Patient   RxID:   [id number] GLUCOPHAGE 500 MG TABS (METFORMIN HCL) 1 by mouth once daily.  #90 Each x 2   Entered by:   Army Fossa CMA   Authorized by:   [name]. Genelda Roark MD   Signed by:   Army Fossa CMA on 10/23/2010   Method used:   Electronically to        [facility]* (retail)       [address] [place], [place]  [address]       Ph: [phone]       Fax: [phone]   RxID:   [id number]  VENTOLIN HFA 108 (90 BASE) MCG/ACT  AERS (ALBUTEROL SULFATE) 2 puff every 6 hours as needed  #3 x 2   Entered by:   Army Fossa CMA   Authorized by:   [name]. Florinda Taflinger MD   Signed by:   Army Fossa CMA on 10/23/2010   Method used:   Electronically to        [facility]* (retail)       [address] [place], [place]  [address]       Ph: [phone]       Fax: [phone]   RxID:   [id number] ISOSORBIDE MONONITRATE CR 60 MG XR24H-TAB (ISOSORBIDE MONONITRATE) Take one tablet by mouth daily  #90 Each x 2   Entered by:   Army Fossa CMA   Authorized by:   [name]. Laikynn Pollio MD   Signed by:   Army Fossa CMA on 10/23/2010   Method used:   Electronically to        [facility]* (retail)       [address] [place], [place]  [address]       Ph: [phone]       Fax: [phone]   RxID:   [id number] PRAVASTATIN SODIUM 40 MG TABS (PRAVASTATIN SODIUM) Take one tablet by mouth daily at bedtime  #90 x 2   Entered by:   Army Fossa CMA   Authorized by:   [name]. Roni Friberg MD   Signed by:   Army Fossa CMA on 10/23/2010   Method used:   Electronically to        [facility]* (retail)       [address] [place], [place]  [address]       Ph: [phone]       Fax: [phone]   RxID:   [id number] METOPROLOL TARTRATE 25 MG  TABS (METOPROLOL TARTRATE) 1 tab by mouth two times a day  #180 x 2   Entered by:   Army Fossa CMA   Authorized by:   [name]. Jakavion Bilodeau MD   Signed by:   Army Fossa CMA on 10/23/2010   Method used:   Electronically to        [facility]* (retail)       [address] [place], [place]  [address]       Ph: [phone]       Fax: [phone]   RxID:   [id number]    Orders Added: 1)  Venipuncture [36415] 2)  TLB-A1C / Hgb A1C (Glycohemoglobin) [83036-A1C] 3)  TLB-CBC Platelet - w/Differential [85025-CBCD] 4)  TLB-BMP (Basic Metabolic Panel-BMET) [80048-METABOL] 5)  Specimen Handling [99000] 6)  Est. Patient Level III [99833] 7)  Spirometry w/Graph [82505]

## 2010-11-04 NOTE — Assessment & Plan Note (Signed)
Summary: COLD, COUGH, SOME WHEEZING, HAS COPD, NO FEVER///SPH   Vital Signs:  Patient profile:   77 year old male Weight:      171.13 pounds Temp:     97.7 degrees F oral Pulse rate:   82 / minute Pulse rhythm:   regular BP sitting:   104 / 60  (left arm) Cuff size:   large  Vitals Entered By: Army Fossa CMA (October 29, 2010 1:04 PM) CC: URI? Comments coughing alot started monday taking mucinex [facility]    History of Present Illness:  2 days ago developed cough,  symptoms are gradually getting worse.  He was seen recently, a week ago he started Qvar and stop using Ventolin.   ROS  No fever He does  have some chest congestion No sputum production (+)  wheezing No nausea, vomiting, myalgias Sinus congestion is at baseline Shortness of breath is slightly worse than usual but not severe  Current Medications (verified): 1)  Metoprolol Tartrate 25 Mg  Tabs (Metoprolol Tartrate) .[name].. 1 Tab By Mouth Two Times A Day 2)  Pravastatin Sodium 40 Mg Tabs (Pravastatin Sodium) .... Take One Tablet By Mouth Daily At Bedtime 3)  Plavix 75 Mg  Tabs (Clopidogrel Bisulfate) .... Take 1 Tablet By Mouth Once A Day 4)  Isosorbide Mononitrate Cr 60 Mg Xr24h-Tab (Isosorbide Mononitrate) .... Take One Tablet By Mouth Daily 5)  Nitroglycerin 0.4 Mg  Subl (Nitroglycerin) .[name].. 1 Sl  Every 5 Min As Needed Chest Pain X 3 Tabs 6)  Ventolin Hfa 108 (90 Base) Mcg/act  Aers (Albuterol Sulfate) .... 2 Puff Every 6 Hours As Needed 7)  Aspirin 325 Mg  Tabs (Aspirin) .... Take 1 Tablet By Mouth Once A Day 8)  Glucophage 500 Mg Tabs (Metformin Hcl) .[name].. 1 By Mouth Once Daily. 9)  Freestyle Lite Test  Strp (Glucose Blood) .... Check Bs 1x/day Dx 250.00 10)  Freestyle Lite Lancets .... Checks Bs 1x/day Dx 250.00 11)  Fish Oil .... Qd 12)  Qvar 80 Mcg/act Aers (Beclomethasone Dipropionate) .[name].. 1 Puff Two Times A Day  Allergies (verified): No Known Drug Allergies  Past History:  Past Medical  History: Reviewed history from 08/26/2009 and no changes required.    COPD--asthma    Coronary artery disease,s/p MI 1992    Diabetes mellitus, type II    Hyperlipidemia    Allergic Rhinitis    Hx of AORTIC DISSECTION--f/u by cards  Past Surgical History: Reviewed history from 01/09/2009 and no changes required. Back surgery, 1993   Left heart catheterization with coronary angiography and left ventriculography.  Percutaneous transluminal coronary angioplasty with placement of a drugeluting stent in the distal right coronary artery.. Percutaneous transluminal coronary angioplasty with placement of a drugeluting stent in the proximal right coronary artery.CARDIOLOGIST:  Carole Binning, M.D.  Physical Exam  General:  alert and well-developed.   no apparent distress Ears:  R ear normal and L ear normal.   Mouth:   no redness or discharge Lungs:  normal respiratory effort, no intercostal retractions, no accessory muscle use, and  bilateral, mild wheezing. No crackles or rhonchi Heart:  normal rate, regular rhythm, no murmur, and no gallop.   Extremities:  lower lower extremity edema   Impression & Recommendations:  Problem # 1:  URI (ICD-465.9)  URI/bronchitis Will treat as bronchitis, hold Qvar for 2 weeks, if the  cough re-surface w/ QVAIR,   then is likely that he is having a side effect and we  will have to find another way to treat COPD-asthma  His updated medication list for this problem includes:    Aspirin 325 Mg Tabs (Aspirin) .[name]... Take 1 tablet by mouth once a day  Problem # 2:  COPD (ICD-496) see #1 His updated medication list for this problem includes:    Ventolin Hfa 108 (90 Base) Mcg/act Aers (Albuterol sulfate) .[name]... 2 puff every 6 hours as needed    Qvar 80 Mcg/act Aers (Beclomethasone dipropionate) .[name]... 1 puff two times a day  Complete Medication List: 1)  Metoprolol Tartrate 25 Mg Tabs (Metoprolol tartrate) .[name].. 1 tab by mouth two times a day 2)  Pravastatin  Sodium 40 Mg Tabs (Pravastatin sodium) .... Take one tablet by mouth daily at bedtime 3)  Plavix 75 Mg Tabs (Clopidogrel bisulfate) .... Take 1 tablet by mouth once a day 4)  Isosorbide Mononitrate Cr 60 Mg Xr24h-tab (Isosorbide mononitrate) .... Take one tablet by mouth daily 5)  Nitroglycerin 0.4 Mg Subl (Nitroglycerin) .[name].. 1 sl  every 5 min as needed chest pain x 3 tabs 6)  Ventolin Hfa 108 (90 Base) Mcg/act Aers (Albuterol sulfate) .... 2 puff every 6 hours as needed 7)  Aspirin 325 Mg Tabs (Aspirin) .... Take 1 tablet by mouth once a day 8)  Glucophage 500 Mg Tabs (Metformin hcl) .[name].. 1 by mouth once daily. 9)  Freestyle Lite Test Strp (Glucose blood) .... Check bs 1x/day dx 250.00 10)  Freestyle Lite Lancets  .... Checks bs 1x/day dx 250.00 11)  Fish Oil  .... Qd 12)  Qvar 80 Mcg/act Aers (Beclomethasone dipropionate) .[name].. 1 puff two times a day 13)  Zithromax Z-pak 250 Mg Tabs (Azithromycin) .... As directed  Patient Instructions: 1)  rest, fluis, tylenol 2)  continue with mucinex 3)  ventolin 4 times as needed if cough-wheezing 4)  if no better in 3 days start a zpack 5)  HOLD QVAR FOR 2 WEEKS 6)  call anytime if symptoms severe 7)  call if no better in 2 weeks  Prescriptions: ZITHROMAX Z-PAK 250 MG TABS (AZITHROMYCIN) as directed  #1 x 0   Entered and Authorized by:   [name]. Damiana Berrian MD   Signed by:   [name]. Aprill Banko MD on 10/29/2010   Method used:   Print then Give to Patient   RxID:   [id number]    Orders Added: 1)  Est. Patient Level III [14782]

## 2010-12-04 LAB — DIFFERENTIAL
Eosinophils Absolute: 0.2 10*3/uL (ref 0.0–0.7)
Lymphocytes Relative: 11 % — ABNORMAL LOW (ref 12–46)
Lymphs Abs: 1.6 10*3/uL (ref 0.7–4.0)
Neutrophils Relative %: 78 % — ABNORMAL HIGH (ref 43–77)

## 2010-12-04 LAB — BASIC METABOLIC PANEL
BUN: 11 mg/dL (ref 6–23)
Calcium: 9 mg/dL (ref 8.4–10.5)
Creatinine, Ser: 1.11 mg/dL (ref 0.4–1.5)
GFR calc non Af Amer: >60 mL/min (ref >60)

## 2010-12-04 LAB — BRAIN NATRIURETIC PEPTIDE: Pro B Natriuretic peptide (BNP): 64.5 pg/mL (ref 0.0–100.0)

## 2010-12-04 LAB — BLOOD GAS, ARTERIAL
Acid-Base Excess: 1.6 mmol/L (ref 0.0–2.0)
Drawn by: 232811
FIO2: 0.21 %
pCO2 arterial: 42.8 mmHg (ref 35.0–45.0)

## 2010-12-04 LAB — D-DIMER, QUANTITATIVE: D-Dimer, Quant: 0.63 ug/mL-FEU — ABNORMAL HIGH (ref 0.00–0.48)

## 2010-12-04 LAB — CBC
MCV: 90.9 fL (ref 78.0–100.0)
Platelets: 218 10*3/uL (ref 150–400)
WBC: 14.9 10*3/uL — ABNORMAL HIGH (ref 4.0–10.5)

## 2011-01-06 NOTE — Assessment & Plan Note (Signed)
[facility]ALTHCARE                            CARDIOLOGY OFFICE NOTE   Phillip Jordan, Phillip Jordan                     MRN:          [id number]  DATE:10/10/2007                            DOB:          [birth year]    Phillip Jordan returns in follow-up.  He has coronary disease, previous stent to  the circ and more recent stents to the RCA in 2005.  Needs a follow-up  Myoview.  He has not had one in approximately 3-1/2years.  He is  currently not having chest pain.  He is fairly active, no palpitations,  syncope, PND, orthopnea.  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  CT scan done Jan 20, 2007 showed 2.8 cm infrarenal  abdominal aneurysm with stable dissection flap.  The patient has been  compliant with his meds.  He is denying significant abdominal pain.   REVIEW OF SYSTEMS:  Otherwise negative.   MEDS:  Include aspirin a day, captopril 12.5 b.i.d., metoprolol 25 half  a tablet b.i.d., Imdur 60 a day, Plavix 75 a day Vytorin 10/80.  He said  that he had his cholesterol checked at the [place].  I asked him to give [place]  these results of these.  Zetia was recently added prior to this I  believe his LDL was around 100.   EXAM:  Remarkable for healthy-appearing middle-aged white male in no  distress.  Blood pressure is 120/70, pulse 60 and regular, weight 186, respiratory  rate 14.  Affect appropriate.  HEENT:  Unremarkable.  Carotids normal without bruit, no lymphadenopathy, thyromegaly, JVP  elevation.  LUNGS:  Clear with good diaphragmatic motion.  No wheezing.  S1-S2 normal.  PMI normal.  ABDOMEN:  Benign.  There is no AAA palpable.  No tenderness.  No  hepatosplenomegaly or hepatojugular reflux.  Distal pulses intact, no edema.  NEURO:  Nonfocal.  SKIN:  Warm and dry.  No muscular weakness.   IMPRESSION:  1. Coronary disease.  Stents to the circ and RCA.  Follow-up Myoview      in May.  Continue aspirin and beta blocker.  2. Hypertension  currently well controlled.  Continue low-salt diet and      captopril.  3. Hypercholesterolemia on Vytorin.  To get results from the [place].  No      side effects from Vytorin.  Target LDL 60.  4. Chronic distal aortic dissection.  Follow-up CT scanning in 2      years.  Currently stable dissection flap.  Continue blood pressure      control and beta blocker.  Overall I think Phillip Jordan is doing well and      I will seen in May when he has the stress test.   ADDENDUM:  EKG today showed an incomplete right bundle branch block,  otherwise was normal.     Phillip Arista C. [name], MD, [facility]  Electronically Signed    PCN/MedQ  DD: 10/10/2007  DT: 10/10/2007  Job #: [id number]

## 2011-01-06 NOTE — Assessment & Plan Note (Signed)
[facility]ALTH[facility]                                 ON-CALL NOTE   Phillip Jordan, Phillip Jordan                     MRN:          [id number]  DATE:10/31/2008                            DOB:          1930/06/13    The caller is Roddy Bellamy, his wife, at [phone].  Call came in on October 31, 2008, 9:16 p.m.   CHIEF COMPLAINT:  Sick, fever, cough.   His regular doctor is Dr. Drue Novel, but he saw Dr. Beverely Low yesterday.  He had  a sore throat and some chest congestion.  His wife states that he goes  into bronchitis very easily and he also has a history of coronary artery  disease with stents and COPD.  His chest was clear on exam, but he did  worsen over the next several days, so he started taking Zithromax pack.  He had 2 pills yesterday and one today and is taking Tussin at night.  She said he, however, is gradually worsening and she is worried he may  have pneumonia.  He has a productive cough and temperature of 101.6 with  chills, even with Tylenol and he is somewhat short of breath.  I told  her that with these symptoms pneumonia with definite possibility or the  flu is a possibility as well, and she is taking him into the emergency  room for evaluation now.     Marne A. Tower, MD  Electronically Signed    MAT/MedQ  DD: 10/31/2008  DT: 11/01/2008  Job #: [id number]

## 2011-01-06 NOTE — Assessment & Plan Note (Signed)
[facility] HEALTHCARE                            CARDIOLOGY OFFICE NOTE   HURSHEL, BOUILLON                     MRN:          [id number]  DATE:01/03/2008                            DOB:          September 28, 1929    Shakil returns today for followup.  He has a chronic distal aortic  dissection which is stable by CT scan in February.  He has a history of  coronary disease with stent to the RCA.  His Myoview study done on Dec 29, 2007 was low risk.  He has a small inferobasal wall infarction with  an EF 69%, no ischemia.  He has been feeling well.   His last LDL cholesterol was 55, and he is on Vytorin.   He is not having any significant chest pain.   REVIEW OF SYSTEMS:  His review of systems is otherwise negative.   MEDICATIONS:  1. Aspirin a day.  2. Captopril, dose questionable.  We have it down as 12.5 mg a day.      He will call us back to let us know for sure.  3. Metoprolol 12.5 b.i.d.  4. Imdur 60 a day.  5. Plavix 75 a day.  6. Vytorin 10/80.  7. Symbicort.   PHYSICAL EXAMINATION:  VITAL SIGNS:  Blood pressure 120/70, pulse 60 and  regular, afebrile, respiratory rate 14, weight is 183.  HEENT:  Unremarkable.  NECK:  Carotids normal without bruit.  No lymphadenopathy, thyromegaly,  or JVP elevation.  LUNGS:  Clear with good diaphragmatic motion.  No wheezing.  CARDIOVASCULAR:  S1-S2 with normal heart sounds.  PMI normal.  ABDOMEN:  Benign.  Bowel sounds positive.  I cannot feel a AAA.  There  is no tenderness.  There is no bruit, no hepatosplenomegaly or  hepatojugular reflux.  EXTREMITIES:  Distal pulses are intact, no edema.  NEUROLOGIC:  Nonfocal.  SKIN:  Warm and dry.  MUSCULOSKELETAL:  No muscular weakness.   His baseline EKG shows sinus rhythm with an incomplete right bundle  branch block and previous inferior wall MI.   IMPRESSION:  1. Coronary disease, previous inferior wall myocardial infarction.      Continue aspirin and beta  blocker.  Currently not having chest      pain, low-risk Myoview.  2. History of distal aortic dissection.  Follow up with CT scan in a      year.  Stable by CT scanning in February.  Blood pressure under      good control.  3. Hypercholesterolemia.  Low-density lipoprotein cholesterol 55.      Liver function tests normal.  No significant problems.  Continue      Vytorin.  4. Seasonal allergies and chronic obstructive pulmonary disease.      Continue Symbicort.   Overall, I think Byan is doing well.  We gave him a copy of his lab  work today.  I will see him back in a year.     [name]. [name], MD, [facility]  Electronically Signed    PCN/MedQ  DD: 01/03/2008  DT: 01/03/2008  Job #: [id number]

## 2011-01-09 NOTE — Discharge Summary (Signed)
NAME:  Phillip Jordan, Phillip Jordan                        ACCOUNT NO.:  [id number]   MEDICAL RECORD NO.:  [id number]                   PATIENT TYPE:  INP   LOCATION:  6522                                 FACILITY:  [facility]   PHYSICIAN:  [name], M.D.                  DATE OF BIRTH:  1929/12/17   DATE OF ADMISSION:  02/23/2004  DATE OF DISCHARGE:  02/27/2004                           DISCHARGE SUMMARY - REFERRING   DISCHARGE DIAGNOSES:  1. Non-ST elevated myocardial infarction, status post stent placement to the     right coronary artery x 2 (Cypher).  2. Hypercholesterolemia, treated.  3. Chronic dissection of the infrarenal abdominal aorta (CVTS consult).  4. Chronic obstructive pulmonary disease, stable.  5. Emphysema.   HOSPITAL COURSE:  Phillip Jordan is a 77 year old male patient of Tinnie Gens A.  Tawanna Cooler, M.D., who has a known cardiac history.  He underwent stent placement  in 1997.  His other risk factors include hyperlipidemia, as well as former  tobacco use.  He was admitted on February 23, 2004, with substernal chest pain  and did rule out in for a non-ST elevated myocardial infarction.   Eventually he did go to the cardiac catheterization laboratory on February 26, 2004, and he was found to have a distal 99% RCA stenosis, as well as, a  proximal 80% stenosis.  Both lesions received a Cypher stent.  An abdominal  aortogram revealed chronic dissection of the infrarenal aorta and this  extends into the right common iliac artery.  He felt this was a stable, but  chronic dissection.   The patient remained in the hospital overnight and was felt to be ready for  discharge to home on February 27, 2004.  The patient does have some residual  coronary artery disease, especially in the LAD with proximal 70% followed by  a mid 60% lesion.  We will perform an exercise treadmill in eight weeks.  Otherwise will maintain the patient on aspirin and Plavix, as well as other  medications as listed below.  He will  follow up with Dr. [name] in two  weeks.   There is a concern over this chronic dissection and Dr. [name] wanted a CVTS  evaluation.  I did speak with __________, R.N., who has arranged for the  patient to be seen prior to discharge so that the surgeon may evaluate the  films and talk to the patient before discharge and before other tests are  ordered.  Additional outpatient followup will be made by CVTS as necessary.   DISCHARGE MEDICATIONS:  The patient will be discharged on these medications:  1. Aspirin 325 mg a day.  2. Plavix 75 mg a day for at least six months, but preferably one year.  3. Zocor, resume home dosage.  4. Imdur, resume home dosage.  5. Toprol, resume home dosage.  6. Captopril 6.25 mg q.8h.  7. May use Tylenol for general discomfort.  ACTIVITY:  No driving for two days.  No strenuous activity for one to two  weeks.   DIET:  Remain on a low-fat, low-salt, low-cholesterol diet.   WOUND CARE:  No tub bathing or swimming for one week.  Clean over  catheterization site gently with soap and water.  Call for questions or  concerns at [phone].   FOLLOWUP:  The patient has a followup appointment on March 17, 2004, at 12  noon for a visit with Dr. [name].  Then the treadmill is scheduled on April 18, 2004, at 10 a.m.  He is instructed not to take his Toprol that morning.      Guy Franco, P.A. [facility]                      [name], M.D.    LB/MEDQ  D:  02/27/2004  T:  02/27/2004  Job:  [id number]   cc:   Tinnie Gens A. Tawanna Cooler, M.D. [facility]   [name], M.D.   CVTS

## 2011-01-09 NOTE — Cardiovascular Report (Signed)
NAME:  Phillip Jordan, Phillip Jordan                        ACCOUNT NO.:  [id number]   MEDICAL RECORD NO.:  [id number]                   PATIENT TYPE:  INP   LOCATION:  3315                                 FACILITY:  [facility]   PHYSICIAN:  Carole Binning, M.D. [facility]         DATE OF BIRTH:  04-Jun-1930   DATE OF PROCEDURE:  02/26/2004  DATE OF DISCHARGE:                              CARDIAC CATHETERIZATION   PROCEDURE PERFORMED:  1. Left heart catheterization with coronary angiography and left     ventriculography.  2. Percutaneous transluminal coronary angioplasty with placement of a drug-     eluting stent in the distal right coronary artery.  3. Percutaneous transluminal coronary angioplasty with placement of a drug-     eluting stent in the proximal right coronary artery.   CARDIOLOGIST:  Carole Binning, M.D.   INDICATIONS:  [name] is a 77 year old male with a history of coronary  artery disease and prior stents to the left circumflex and the mid right  coronary artery.  He presented to the hospital three days ago with chest  pain and ruled in for a non Q-wave myocardial infarction.  Of note, he has a  history of a chronic dissection of his distal abdominal aorta noted on  previous catheterization.  He was evaluated by Dr. Madilyn Fireman at that time and it  was elected to manage him medically with observation.   DESCRIPTION OF PROCEDURE:  A 6 French sheath was placed in the left femoral  artery.  A J-tipped wire was advanced under fluoroscopic guidance into the  central aorta without difficulty.  We then performed all of our catheter  exchanges over an exchange length wire. Native coronary angiography was  performed with standard Judkins 6 French catheters.  Left ventriculography  and abdominal aortography were performed with an angled pigtail catheter.  Contrast was Omnipaque.  There were no complications.   RESULTS:  HEMODYNAMICS:  Left ventricular pressure 126/90.  Aortic pressure  120/70.   There was no aortic valve gradient.   LEFT VENTRICULOGRAM:  Wall motion is normal.  Ejection fraction is estimated  at greater than or equal to 65%.  There is trace mitral regurgitation.   CORONARY ARTERIOGRAPHY:  (Right dominant).  1. The left main has an ostial 40% stenosis and a distal 40% stenosis.  2. The left anterior descending artery has a 60-70% stenosis in the proximal     vessel.  There is diffuse disease in the mid to distal vessel with a 40%     stenosis in the mid vessel, 60% followed by 30% in the distal vessel.     The left anterior descending artery gives rise to a small first diagonal     branch and normal size second and third diagonal branches.  The second     diagonal branch has a 60% stenosis proximally and a 30% stenosis in the     mid vessel.  3. Left  circumflex has a 50-60% stenosis at its ostium.  There is a stent in     the mid circumflex with 25% stenosis within the stent.  The circumflex     gives rise to a single normal size obtuse marginal branch.  4. The right coronary artery is a dominant vessel with diffuse disease.  In     the proximal vessel there is a diffuse 80% stenosis with irregularity and     possible ulceration.  There is a stent in the mid vessel which is widely     patent with less than 10% stenosis within the stent.  Just beyond the     stent in the distal vessel there is a 40% stenosis followed by a tubular     99% stenosis with possible thrombus.  The distal right coronary artery     gives rise to a large acute marginal which supplies a portion of the     distal second.  There is a small posterior descending artery, a large     first posterior lateral branch and a small second posterior lateral     branch arising from the distal right coronary artery.   ABDOMINAL AORTOGRAM:  The abdominal aortogram reveals a chronic dissection  beginning in the infrarenal aorta and extending into the right common iliac  artery.  Both the true lumens and  false lumens were visible.  This appears  to be stable in nature.  Renal arteries are patent.   IMPRESSION:  1. Normal left ventricular systolic function.  2. Three vessel coronary artery disease.  The culprit is the 99% stenosis in     the distal right coronary artery.  There is also significant disease in     the proximal right coronary artery.  There is moderate disease in the     left anterior descending artery and the left circumflex which is of     borderline severity.  3. Chronic stable dissection of the distal abdominal aorta extending to the     right common iliac artery.   PLAN:  These results were reviewed with Dr. [name].  It was felt the best  approach would be percutaneous coronary intervention to the right coronary  artery followed by medical therapy for the residual coronary disease.  The  LAD could be treated percutaneously if the patient has recurrence of  symptoms.   PERCUTANEOUS INTERVENTIONAL PROCEDURE:  We utilized the preexisting 6 French  sheath in the right femoral artery.  The patient had been treated on heparin  and Integrilin prior to coming to the catheterization laboratory.  Additional heparin was administered to maintain an ACT of greater than 300  seconds.  We used a 6 [place] JR4 guiding catheter.  An Asahi soft coronary  guidewire was advanced under fluoroscopic guidance into the distal right  coronary artery.  We initially treated the lesion in the distal vessel.  We  used a 3.0 x 15 mm Quantum balloon inflated to 10 atmospheres.  We then  positioned a 3.0 x 18 mm Cypher drug-eluting stent across the area of  disease and deployed the stent at 16 atmospheres.  We then went back in with  a 3.25 x 15 mm Quantum balloon and inflated this to 20 atmospheres in the  distal aspect of the stent and 18 atmospheres in the proximal aspect of the stent.  Angiographic images at that point revealed an excellent result in  the distal right coronary artery of 0%  residual stenosis  and TIMI-3 flow.   We then turned out attention to the proximal right coronary artery.  We  positioned a 3.5 x 23 mm Cypher drug-eluting stent across a diseased segment  of vessel.  The stent was deployed at 20 atmospheres.  We then went back  with a 4.0 x 15 mm Quantum balloon and inflated this to 22 atmospheres in  the distal aspect of the stent and 18 atmospheres in the proximal aspect of  the stent.  Following this, we went in a 4.5 x 12 mm Quantum balloon and  inflated this to 16 atmospheres in the distal aspect of the stent and 10  atmospheres in the mid portion of the stent.  Intermittent doses of  nitroglycerin were administered.  Final angiographic images were obtained  showing patency of the proximal right coronary artery with 0% residual  stenosis at the stent site and TIMI-3 flow.   COMPLICATIONS:  None.   RESULTS:  1. Successful percutaneous transluminal coronary angioplasty with placement     of a drug-eluting stent in the distal right coronary artery.  A 99%     stenosis with possible thrombus was reduced to 0% residual TIMI-3 flow.  2. Successful placement of a drug-eluting stent in the proximal right     coronary artery.  An 80% stenosis was reduced to 0% residual with TIMI-3     flow.   PLAN:  1. Integrilin will be continued for 18 hours.  2. The patient will be treated with Plavix for the recommended 12 months,     minimum of six months.  3. At this point, we plan to treat the patient medically for his residual     coronary artery disease.  4. The left anterior descending artery gives rise to a small first diagonal     branch and normal size second and third diagonal branches.  The second     diagonal branch has a 60%  Left main:  Normal.   Left anterior descending coronary artery has a 30% stenosis in the mid  vessel.  The LAD gives rise to three small diagonal branches.   The left circumflex gives rise to a normal size first marginal and a  large  second marginal.  There is a 20% stenosis in the proximal circumflex and a  40% stenosis in the first marginal branch.   The right coronary artery is a very tortous artery.  There is a tubular 80%  stenosis in the mid vessel.  The distal right coronary artery gives rise to  a normal size posterior descending and a small posterior lateral branch.   IMPRESSION:  1. Mildly decreased left ventricular systolic function.  2. One vessel coronary artery disease characterized by an 80% stenosis in     the mid right coronary artery.   PLAN:  As the patient has active bleeding from his hip abscess drainage  site, we will defer percutaneous coronary intervention at this time.  We  will wait three days to allow the wound to heal and stop bleeding.  If it is  stabilized, we will proceed with percutaneous coronary intervention of the  right coronary artery at that time.                                              Carole Binning, M.D. [facility]    MWP/MEDQ  D:  02/26/2004  T:  02/26/2004  Job:  [id number]   cc:   Tinnie Gens A. Tawanna Cooler, M.D. [facility]   [name], M.D.

## 2011-01-09 NOTE — Consult Note (Signed)
NAME:  Phillip, Jordan                        ACCOUNT NO.:  [id number]   MEDICAL RECORD NO.:  [id number]                   PATIENT TYPE:  INP   LOCATION:  6522                                 FACILITY:  [facility]   PHYSICIAN:  Janetta Hora. Fields, MD               DATE OF BIRTH:  06-11-1930   DATE OF CONSULTATION:  02/27/2004  DATE OF DISCHARGE:  02/27/2004                                   CONSULTATION   REASON FOR CONSULTATION:  Abdominal aortic dissection, chronic.   REFERRING PHYSICIAN:  [name], M.D.   HISTORY OF PRESENT ILLNESS:  The patient is a 77 year old male admitted four  days previously with chest pain and history of coronary artery disease.  He  underwent successful percutaneous angioplasty and stenting of his coronary  arteries.  He was noted to have an area of infrarenal chronic dissection on  his catheterization.  This area of dissection had been previously noted by  Arturo Morton. Riley Kill, M.D. [facility] in 1997.  The patient has been completely  asymptomatic.  He denies abdominal pain or back pain. He denies history of  claudication or rest pain.  He is able to ambulate lengthy distances daily.   PAST MEDICAL HISTORY:  He has a history of coronary artery disease and had a  myocardial infarction in 1994.  He also had angioplasty and stenting in  1997.  He has a history of hyperlipidemia and COPD. He denies history of  TIA, stroke or diabetes.  Past medical history is remarkable for back  surgery.   MEDICATIONS:  1. Aspirin.  2. Toprol.  3. Nitrates.  4. Zocor.  5. Nitroglycerin.  6. Multivitamin.  7. Plavix.   ALLERGIES:  No known drug allergies.   REVIEW OF SYSTEMS:  HEENT:  Hearing aid.  Skin sampling, he has a blemish on  his right cheek and is awaiting dermatology follow-up.  NEUROLOGY:  He has a  past history of seizures.  PULMONARY:  He has no history of dyspnea except  with severe exertion.  HEMATOLOGY:  He has no history of bleeding diathesis  or  hypercoagulability.   FAMILY HISTORY:  No history of abdominal aortic aneurysm, dissection.  His  mother did have coronary artery disease at an early age.   SOCIAL HISTORY:  He is a former smoker and quit 15 years ago.  He has no  history of frequent alcohol abuse.  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   PHYSICAL EXAMINATION:  VITAL SIGNS:  He is afebrile and his vital signs are  stable.  HEENT:  Right-sided hearing aid and a 1 x 1 cm smooth nodule on the right  side of his cheek.  NECK:  No bruits.  He has 2+ carotid pulses and 2+ subclavian pulses  bilaterally.  CHEST:  Clear to auscultation bilaterally.  HEART:  Regular rate and rhythm.  ABDOMEN:  He has no scars.  He has no palpable masses.  He is nontender and  nondistended.  EXTREMITIES:  2+ femoral and dorsalis pedis pulses bilaterally.   Review of his angiogram shows an infrarenal dissection with iliac extension.  There is no involvement of the renal arteries.   LABORATORY DATA:  Hemoglobin 14.6 and platelet count 267.  He had a BUN of 9  and creatinine of 1.1.   ASSESSMENT:  Chronic dissection of the infrarenal abdominal aorta.   RECOMMENDATIONS:  I will have the patient scheduled for a CT angiogram  within the next week and follow-up with me in one week as well.  If the area  of dissection has been stable, then he will warrant only close monitoring of  his blood pressure, however, if the diameter of the dissection has changed  or if he has compromise of any major branch vessels, then we will consider  further treatment.                                               Janetta Hora. Fields, MD    CEF/MEDQ  D:  02/27/2004  T:  02/28/2004  Job:  [id number]

## 2011-01-09 NOTE — Consult Note (Signed)
NAME:  Phillip Jordan, Phillip Jordan                        ACCOUNT NO.:  [id number]   MEDICAL RECORD NO.:  [id number]                   PATIENT TYPE:  INP   LOCATION:  6522                                 FACILITY:  [facility]   PHYSICIAN:  Janetta Hora. Fields, MD               DATE OF BIRTH:  Aug 18, 1930   DATE OF CONSULTATION:  DATE OF DISCHARGE:  02/27/2004                                   CONSULTATION   ADDENDUM:  Previously dictated report, dictation No. [phone] - If you could  send copies of that dictation report to Dr. Daisey Must, a copy also to  Dr. [name], a copy to Dr. Kelle Darting and a copy to Dr. Randa Evens.                                               Janetta Hora. Fields, MD    CEF/MEDQ  D:  02/27/2004  T:  02/28/2004  Job:  [id number]

## 2011-01-09 NOTE — Assessment & Plan Note (Signed)
[facility]ALTHCARE                              CARDIOLOGY OFFICE NOTE   Phillip Jordan, Phillip Jordan                     MRN:          [id number]  DATE:03/25/2006                            DOB:          Nov 07, 1929    FOLLOWUP   He has been doing well.  He has lost about 8 pounds.  Apparently he is  switching physicians from Dr. Tawanna Cooler to Dr. Drue Novel.  There is a question of some  diabetes and he was not real happy with the followup given to him by Dr.  Tawanna Cooler.  His hemoglobin A1c was 5.8 and he continues to monitor his sugars but  with his weight loss he has not needed any therapy.   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  It was stable by CT last year.  He has a previous inferior wall  MI with angioplasty and moderate circumflex disease.  He also has a stent in  his OM.   His last Myoview was low risk with inferior infarct and trivial peri-infarct  ischemia in February this year.  He is not having significant angina.   He is on aspirin and Plavix.   EXAM:  GENERAL:  He looks well.  VITAL SIGNS:  Blood pressure is 128/80.  Pulse 70 and regular  LUNGS:  Clear.  Carotids are normal.  There is no S1 and S2 with normal  heart sounds.  ABDOMEN:  Benign.  LOWER EXTREMITIES:  Without __________ edema.   IMPRESSION:  Stable coronary disease, nonischemic Myoview this year.  No  evidence of chest pain.  Continue aspirin and Plavix.  He is on Lipitor 80  mg a day.  His last LFTs were within normal range.   His LDL cholesterol is around 80.  His EKG today is essentially normal with  an incomplete right bundle.   There is evidence for old inferior wall myocardial infarction.  He will have  followup CT scan next year for his distal aortic dissection.  I will see him  back in 6 months.                               [name]. [name], MD, [facility]    PCN/MedQ  DD:  03/25/2006  DT:  03/25/2006  Job #:  [id number]

## 2011-01-09 NOTE — Assessment & Plan Note (Signed)
[facility] HEALTHCARE                            CARDIOLOGY OFFICE NOTE   Phillip Jordan, Phillip Jordan Phillip Jordan                     MRN:          [id number]  DATE:10/07/2006                            DOB:          05/09/1930    Phillip Jordan returns today for followup, he has had a previous inferior  wall myocardial infarction with angioplasty to the right coronary  artery.  He has also had angioplasty of the circ.   He is not having any significant chest pain.  He has a history of  chronic stable distal aortic dissection which is infrarenal.  His last  computed tomography scan was in May of 2006.  I told him we should  probably repeat one in May to make sure there is no growth in the size  or extension.  He has not had any significant back pain or abdominal  pain.   REVIEW OF SYSTEMS:  Otherwise remarkable for no PND, orthopnea, no  syncope, no palpitations.  Since I last saw him he has hooked up with  Dr. [name] who he likes very much as a primary care M.D.   The patient's risk factors are being well modified.  His LDL cholesterol  was 80 on Zocor and he was started on Zetia 10 mg a day by Dr. Drue Novel.  I  talked to him about this for about 10 minutes and despite the recent  study in the [place] Journal of Medicine I do not think Zetia will  be bad for him.   MEDICATIONS:  1. An Aspirin a day.  2. Captopril 12.5 mg a day.  3. Lopressor 25 mg half a tablet b.i.d.  4. Zocor 80 mg a day.  5. Plavix 75 mg a day.  6. Zetia 10 mg a day.   EXAMINATION:  HEENT:  Normal.  Blood pressure is 114/64, pulse is 60 and regular.  LUNGS:  Clear.  There is an S1, S2, with normal heart sounds.  ABDOMEN:  Benign.  LOWER EXTREMITIES:  Intact pulses, no edema.  ABDOMINAL:  Aorta is not palpable.  NEURO:  Nonfocal.  SKIN:  Warm and dry.   He had a Myoview study done last year which was low risk with an  inferior scar and mild peri-infarct ischemia.   IMPRESSION:  Stable 2  vessel coronary disease, status post percutaneous  transluminal coronary angioplasty and stenting.  Continue aspirin and  Plavix.  Low risk Myoview last year, I will probably have a followup  Myoview in 2009.  The patient has nitroglycerin, he will call us if he  gets any significant chest pain.  He will see Dr. [name] for his  primary care needs, he will follow up his lipids to see if he hits  target goal of an LDL of 50-60 with the addition of Zetia.   We will order a computed tomography scan of his aorta in May to followup  his chronic distal aortic dissection.  This appears stable and I do not  think there is any contraindication to chronic use of aspirin and  Plavix.  His blood  pressure is well controlled on beta blocker therapy  and Captopril.  Overall I am pleased with his progress and we will see  him back in a year.     [name]. [name], MD, [facility]  Electronically Signed    PCN/MedQ  DD: 10/07/2006  DT: 10/07/2006  Job #: [id number]

## 2011-01-09 NOTE — H&P (Signed)
NAME:  Phillip Jordan, Phillip Jordan                        ACCOUNT NO.:  [id number]   MEDICAL RECORD NO.:  [id number]                   PATIENT TYPE:  INP   LOCATION:  3315                                 FACILITY:  [facility]   PHYSICIAN:  Salvadore Farber, M.D. [facility]         DATE OF BIRTH:  [birth year]   DATE OF ADMISSION:  02/23/2004  DATE OF DISCHARGE:                                HISTORY & PHYSICAL   PRIMARY CARDIOLOGIST:  Dr. [name].   PRIMARY MEDICAL DOCTOR:  Dr. Tinnie Gens A. Todd.   CHIEF COMPLAINT:  Chest pain.   HISTORY OF PRESENT ILLNESS:  [age]-year-old Caucasian male, with a  history of coronary artery disease, had a stent placed in 1997,  hyperlipidemia, presents with chest pain.  The patient states that 6 a.m. he  awoke with left arm pain, diaphoresis, nausea, no vomiting and no shortness  of breath, 5/10, described as sharp in nature, no radiation to his chest.  Pain lasted approximately 1 hour, took medicines and eased off.  Pain re-  presented at 2:30 p.m., same character and came to the ER.  When arrived at  ER, had chest pain, pressure sensation, 7/10, relieved with IV  nitroglycerin; no PND, orthopnea or lower extremity edema, syncope or  presyncope.  He states that this pain is similar to prior MI.   PAST MEDICAL HISTORY:  1. Coronary artery disease.  In 1994, had an MI.  Had planned on balloon     angioplasty in 1997, chest pain, stent placed by Dr. Arturo Morton. Stuckey.  2. Hyperlipidemia.  3. Emphysema.   ALLERGIES:  No known drug allergies.   MEDICATIONS:  1. Enteric-coated aspirin 325 mg daily.  2. Toprol-XL 25 mg daily.  3. Isosorbide mononitrate 30 mg daily.  4. Zocor 40 mg daily.  5. Nitroglycerin p.r.n.  6. Multivitamin daily.   SOCIAL HISTORY:  He lives in [place] with his wife.  He works at [employer].  He smoked 1-1/2 packs per day x30 years, quit in 1994.  No alcohol, no IV drugs.   FAMILY HISTORY:  Family history is  noncontributory.   REVIEW OF SYSTEMS:  Review of systems positive for having a hearing aid.  All other systems are negative.   PHYSICAL EXAM:  VITAL SIGNS:  Pulse 59, blood pressure 126/65.  GENERAL:  Alert and oriented x3, in no apparent distress.  HEENT/NECK:  Pupils equal, round and reactive to light.  Extraocular  movements intact.  No lymphadenopathy.  No carotid bruits.  He is hard of  hearing.  CHEST:  Chest clear to auscultation bilaterally.  CARDIOVASCULAR:  Normal S1 and S2.  No murmurs, rubs, or gallops.  ABDOMEN:  Abdomen soft, nontender and nondistended.  Positive bowel sounds.  EXTREMITIES:  No clubbing, cyanosis or edema.  Strong radial, femoral and  pedal pulses.   ACCESSORY CLINICAL DATA:  Chest x-ray:  No acute cardiopulmonary process.   EKG:  Rate 59, normal sinus rhythm, axis 65, P-R 217, QRS 100, QTc at 417.  He has a first-degree A-V block, Q's in II, III and aVF, V5 and V6.  ST and  T wave changes are nonspecific, V1 and V2.  Hypertrophy.  Left atrial  enlargement.   LABORATORIES:  White count 7.4, hemoglobin and hematocrit 15/42, platelets  248,000.  Sodium 142, potassium 4.9, BUN 16, creatinine 1.1, glucose 118;  total bilirubin 0.4, AST of 25, ALT of 27.  MB of 2.4, troponin less than  0.05.  Alkaline phosphatase 83, total protein 6.8, albumin 3.9.  Coagulations are pending.   ASSESSMENT:  [age]-year-old male with a history of coronary artery  disease presents with typical angina.   PLAN:  1. CAD:  Rule out with 3 sets of cardiac enzymes, place on heparin,     __________, IIb/IIIa, considering TIMI score.  He has had aspirin in the     last 7 days.  He has had 2 bouts of chest pain in the past 24 hours.  He     has a history of 2 coronary artery disease risk factors and his age is     greater than 71, so he has 4/7 criteria.  We will also start aspirin,     beta blocker and ACE inhibitor and a statin and titrate the beta blocker     and ACE  inhibitor to effect, place him on a nitroglycerin drip to relieve     his chest pain, which currently is 1/10.  Check a TSH and a free T4.  In     reference to the coronary artery disease, I will plan for a tentative     cath on Tuesday.  2. Hyperlipidemia:  Check a lipid panel, GI, __________.  Continue a statin.  3. He is full code.      Greggory Stallion L. Pernell Dupre, M.D.                     Salvadore Farber, M.D. [facility]    GLA/MEDQ  D:  02/23/2004  T:  02/25/2004  Job:  [id number]

## 2011-02-20 ENCOUNTER — Encounter: Payer: Self-pay | Admitting: Internal Medicine

## 2011-02-23 ENCOUNTER — Ambulatory Visit: Payer: Medicare PPO | Admitting: Internal Medicine

## 2011-02-23 ENCOUNTER — Encounter: Payer: Self-pay | Admitting: Internal Medicine

## 2011-02-23 ENCOUNTER — Ambulatory Visit (INDEPENDENT_AMBULATORY_CARE_PROVIDER_SITE_OTHER): Payer: Medicare PPO | Admitting: Internal Medicine

## 2011-02-23 DIAGNOSIS — I2510 Atherosclerotic heart disease of native coronary artery without angina pectoris: Secondary | ICD-10-CM

## 2011-02-23 DIAGNOSIS — J45909 Unspecified asthma, uncomplicated: Secondary | ICD-10-CM

## 2011-02-23 DIAGNOSIS — E785 Hyperlipidemia, unspecified: Secondary | ICD-10-CM

## 2011-02-23 DIAGNOSIS — E119 Type 2 diabetes mellitus without complications: Secondary | ICD-10-CM

## 2011-02-23 LAB — HEMOGLOBIN A1C: Hgb A1c MFr Bld: 6.5 % (ref 4.6–6.5)

## 2011-02-23 MED ORDER — CLOPIDOGREL BISULFATE 75 MG PO TABS: 75.0000 mg | ORAL_TABLET | Freq: Every day | ORAL | Status: DC

## 2011-02-23 NOTE — Assessment & Plan Note (Signed)
Seems to be doing well, check a hemoglobin A1c 

## 2011-02-23 NOTE — Assessment & Plan Note (Signed)
Well-controlled per last FLP 

## 2011-02-23 NOTE — Progress Notes (Signed)
  Subjective:    Patient ID: Phillip Jordan, male    DOB: 1929-12-23, 77 y.o.   MRN: [id number]  HPI Routine office visit, in general feels well and has no concerns  Past Medical History  Diagnosis Date  . COPD with asthma   . CAD (coronary artery disease)     s/p MI 36  . Diabetes mellitus   . Hyperlipidemia   . Allergic rhinitis   . Aortic dissection     f/u by cards     Social History: Married   3 kids, 8 gk  Patient states former smoker.  (quit 1994) pt is retired from [employer]. Alcohol use-no Drug use-no Regular exercise-- yes[facility] [employer]   Review of Systems Asthma well-controlled, he is not taking Qvar, he uses albuterol as needed for wheezing, but reports he uses it less than 3 or 4 times a week. Denies chest pain or lower extremity edema Good medication with cholesterol medication. Blood sugars check from time to time, they range from 98-115.    Objective:   Physical Exam  Constitutional: He appears well-developed and well-nourished. No distress.  HENT:  Head: Normocephalic and atraumatic.  Cardiovascular: Normal rate, regular rhythm and normal heart sounds.   Skin: He is not diaphoretic.          Assessment & Plan:

## 2011-02-23 NOTE — Patient Instructions (Signed)
For asthma take  only ventolin /albuterol as needed for cough or wheezing. If you are using it more than 3 times a week, let me know. If that is the case, you will be the addition of a different medication

## 2011-02-23 NOTE — Assessment & Plan Note (Signed)
Not using Qvar, we'll discontinue it. Using albuterol less than 3 or 4 times a week. See  instructions

## 2011-05-26 ENCOUNTER — Encounter: Payer: Self-pay | Admitting: Internal Medicine

## 2011-05-26 ENCOUNTER — Ambulatory Visit (INDEPENDENT_AMBULATORY_CARE_PROVIDER_SITE_OTHER)
Admission: RE | Admit: 2011-05-26 | Discharge: 2011-05-26 | Disposition: A | Discharge status: Home / Self Care | Payer: Medicare PPO | Source: Ambulatory Visit | Attending: Internal Medicine | Admitting: Internal Medicine

## 2011-05-26 ENCOUNTER — Ambulatory Visit (INDEPENDENT_AMBULATORY_CARE_PROVIDER_SITE_OTHER): Payer: Medicare PPO | Admitting: Internal Medicine

## 2011-05-26 DIAGNOSIS — J45909 Unspecified asthma, uncomplicated: Secondary | ICD-10-CM

## 2011-05-26 DIAGNOSIS — E785 Hyperlipidemia, unspecified: Secondary | ICD-10-CM

## 2011-05-26 DIAGNOSIS — J449 Chronic obstructive pulmonary disease, unspecified: Secondary | ICD-10-CM

## 2011-05-26 DIAGNOSIS — E119 Type 2 diabetes mellitus without complications: Secondary | ICD-10-CM

## 2011-05-26 DIAGNOSIS — Z0000 Encounter for general adult medical examination without abnormal findings: Secondary | ICD-10-CM | POA: Insufficient documentation

## 2011-05-26 DIAGNOSIS — J4489 Other specified chronic obstructive pulmonary disease: Secondary | ICD-10-CM

## 2011-05-26 DIAGNOSIS — Z23 Encounter for immunization: Secondary | ICD-10-CM

## 2011-05-26 DIAGNOSIS — E291 Testicular hypofunction: Secondary | ICD-10-CM

## 2011-05-26 LAB — BASIC METABOLIC PANEL
Calcium: 9.5 mg/dL (ref 8.4–10.5)
GFR: 75.73 mL/min (ref >60.00)
Sodium: 140 mEq/L (ref 135–145)

## 2011-05-26 MED ORDER — BUDESONIDE-FORMOTEROL FUMARATE 160-4.5 MCG/ACT IN AERO: 2.0000 | INHALATION_SPRAY | Freq: Two times a day (BID) | RESPIRATORY_TRACT | Status: DC

## 2011-05-26 NOTE — Progress Notes (Signed)
Subjective:    Patient ID: Phillip Jordan, male    DOB: 1930/02/22, 77 y.o.   MRN: [id number]  HPI Here for Medicare AWV: 1. Risk factors based on Past M, S, F history: yes  2. Physical Activities: [employer] 3 times a week, active  3. Depression/mood: denies, no problems noted  4. Hearing: poor hearing , has hearing aids, sees audiology 5. ADL's: totally independent , still drives  6. Fall Risk: low  7. Home Safety: feels safe at home  8. Height, weight, &visual acuity:see VS, vision corrected, sees eye doctor yearly  9. Counseling: yes , see below  10. Labs ordered based on risk factors: yes  11.           Referral Coordination: if needed  12.           Care Plan: see A/P 13.            Cognitive Assessment : cognition, memory, motor skills seem appropriate.  in addition, we discussed the following issues COPD, asthma-- has noted he "run out of air " when singing , occasionally experiences dyspnea on exertion. Admits to occasional cough w/ mild  production of mucus, no hemoptysis. Cough happens twice a week, has not required albuterol but he has it available. Also complains of hoarseness on and off, for years, saw ENT about 6 years ago. Coronary artery disease, good medication compliance , saw cards 2-12, stable  Diabetes-- ambulatory CBGs wnl, vary from 90 -115     Review of Systems  Constitutional: Negative for fever and unexpected weight change. Fatigue: occ fatigue   Cardiovascular: Negative for palpitations and leg swelling.  Genitourinary: Negative for dysuria, hematuria and difficulty urinating.   see history of present illness  Past Medical History  Diagnosis Date  . COPD with asthma   . CAD (coronary artery disease)     s/p MI 36  . Diabetes mellitus   . Hyperlipidemia   . Allergic rhinitis   . Aortic dissection     f/u by cards   Past Medical History  Diagnosis Date  . COPD with asthma   . CAD (coronary artery disease)     s/p MI 66  . Diabetes mellitus    . Hyperlipidemia   . Allergic rhinitis   . Aortic dissection     f/u by cards   Past Surgical History  Procedure Date  . Back surgery 1993  . Cardiac catheterization     left, w/ coronary angiography and left ventriculograpy  . 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  (quit 1994) pt is retired from [employer]. Alcohol use-no Drug use-no        Objective:   Physical Exam  Constitutional: He is oriented to person, place, and time. He appears well-developed and well-nourished. No distress.  HENT:  Head: Normocephalic and atraumatic.       Mild hoarseness  Neck: No thyromegaly present.       Nl carotid pulse   Cardiovascular: Normal rate, regular rhythm and normal heart sounds.   No murmur heard. Pulmonary/Chest:       Few end expiratory wheezes, fine crackle at bases? No respiratory distress  Abdominal:  Soft. He exhibits no distension. There is no tenderness. There is no rebound and no guarding.  Genitourinary: Rectum normal and prostate normal.  Musculoskeletal: He exhibits no edema.  Neurological: He is alert and oriented to person, place, and time.  Skin: He is not diaphoretic.  Psychiatric: He has a normal mood and affect. His behavior is normal. Judgment and thought content normal.       Assessment & Plan:

## 2011-05-26 NOTE — Patient Instructions (Signed)
Use Albuterol before exertion and singing

## 2011-05-26 NOTE — Assessment & Plan Note (Signed)
see asthma

## 2011-05-26 NOTE — Assessment & Plan Note (Signed)
Labs

## 2011-05-26 NOTE — Assessment & Plan Note (Addendum)
Asthma/COPD, patient is a former smoker, on chart review, he was never really under optimal control, in part because poor compliance with medications. Plan: Change from Qvar to Symbicort Refer to pulmonary, he has some fine crackles at bases, pulmonary fibrosis? Chest x-ray Albuterol encouraged to use pre-exertion and singing

## 2011-05-26 NOTE — Assessment & Plan Note (Addendum)
chart reviewed  Td 2008, pneumonia shot 2008, had shingle shot already, flu shot--today   Cscope 2007, TICS, no polyps (Dr Jarold Motto). Next Cscope...per GI  prostate is normal, all previous PSAs normal. Will stop doing prostate cancer screening at this point , discussed that with the patient, he is in agreement counseled  about diet exercise  In the past, he has a slightly low testosterone, we'll check a bone density test to be sure he is not developing osteoporosis

## 2011-05-27 ENCOUNTER — Telehealth: Payer: Self-pay | Admitting: *Deleted

## 2011-05-27 NOTE — Assessment & Plan Note (Signed)
LDL 78, no change

## 2011-05-27 NOTE — Telephone Encounter (Signed)
Pt left VM that Pt is having some hoarseness since starting symbicort. Left message to call office to get further details.

## 2011-05-28 NOTE — Telephone Encounter (Signed)
Patient informed. Samples ready for p/u.

## 2011-05-28 NOTE — Telephone Encounter (Signed)
He had hoarseness when he was here, recommend to continue with symbicort, if he gets worse then let me know

## 2011-05-28 NOTE — Telephone Encounter (Signed)
Change to [facility] 100/5-----> 2 puffs bid, give sample (if available ) to see if that works better for him

## 2011-05-28 NOTE — Telephone Encounter (Signed)
Pt return call indicating that the hoarseness has increased to the point that he could not talk. Pt notes that he has not taking med this AM and the hoarseness has improved some. Please advise.

## 2011-06-17 ENCOUNTER — Ambulatory Visit (INDEPENDENT_AMBULATORY_CARE_PROVIDER_SITE_OTHER): Payer: Medicare PPO | Admitting: Internal Medicine

## 2011-06-17 ENCOUNTER — Encounter: Payer: Self-pay | Admitting: Internal Medicine

## 2011-06-17 VITALS — BP 126/74 | HR 60 | Temp 97.5°F | Ht 69.0 in | Wt 174.0 lb

## 2011-06-17 DIAGNOSIS — J449 Chronic obstructive pulmonary disease, unspecified: Secondary | ICD-10-CM

## 2011-06-17 DIAGNOSIS — R490 Dysphonia: Secondary | ICD-10-CM

## 2011-06-17 NOTE — Assessment & Plan Note (Signed)
?   Due to LPR ? Due to singing ? Due to smoking ? GI etiology - has dysphagia  PLAN ENT referral - [place] [facility] or Dr Suszanne Conners preferred

## 2011-06-17 NOTE — Assessment & Plan Note (Signed)
Reassess current status and differentiate from dx of asthma in chart; so will get full PFT

## 2011-06-17 NOTE — Progress Notes (Signed)
Subjective:    Patient ID: Phillip Jordan, male    DOB: [birth year], 77 y.o.   MRN: [id number]  HPI 77 year old male, ex-smoker (1ppd x 30 years, quit 1990s), diagnosed with COPD per hx by Dr Shelle Iron (fev1 1.38L/44%, ratio 51 in Jan 2009). This is new visit since last visit here was > 3 years ago. He is maintained on dulera and ? spiriva  Reports 3 years of insidious onset new hoarseness. 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 He is not sure if singing is making hoarseness worse but feels current hoarseness thoug moderate in severity is interefering with singing inability. No associated pain but there is associated dysphagia for pills. Also reports dyspnea, cough, wheeze due to copd but this is currently mild and stable. There is post nasal drip but Rx for this has not helped hoarseness. REcollects that symbicort made hoarseness worse and is now on dulera which while has not made hoarseness worse has not helped it either. No fever.   Fam hx and social hx: dtr has vocal cord nodules - is a [employer] representative.   Past med: denies gerd but is ex-smoker and has copd  Past Medical History  Diagnosis Date  . COPD with asthma   . CAD (coronary artery disease)     s/p MI 74  . Diabetes mellitus   . Hyperlipidemia   . Allergic rhinitis   . Aortic dissection     f/u by cards     Family History  Problem Relation Age of Onset  . Prostate cancer Neg Hx   . Colon cancer Neg Hx   . Asthma Mother   . Heart disease Mother     CHF  . Heart disease Father     MI  . Cancer Mother     melanoma     History   Social History  . Marital Status: Married    Spouse Name: N/A    Number of Children: 3  . Years of Education: N/A   Occupational History  . retired    Social History Main Topics  . Smoking status: Former Smoker -- 1.5 packs/day for 39 years    Types: Cigarettes    Quit date: 08/24/1992  . Smokeless tobacco: Not on file  .  Alcohol Use: No  . Drug Use: No  . Sexually Active: Not on file   Other Topics Concern  . 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     No Known Allergies   Outpatient Prescriptions Prior to Visit  Medication Sig Dispense Refill  . albuterol (VENTOLIN HFA) 108 (90 BASE) MCG/ACT inhaler Inhale 2 puffs into the lungs every 6 (six) hours as needed.        [name] aspirin 325 MG tablet Take 325 mg by mouth daily.        . clopidogrel (PLAVIX) 75 MG tablet Take 1 tablet (75 mg total) by mouth daily.  90 tablet  3  . fish oil-omega-3 fatty acids 1000 MG capsule Take 2 g by mouth daily.        . isosorbide mononitrate (IMDUR) 60 MG 24 hr tablet Take 60 mg by mouth daily.        . metFORMIN (GLUCOPHAGE) 500 MG tablet Take 500 mg by mouth daily with breakfast.        .  metoprolol tartrate (LOPRESSOR) 25 MG tablet Take 25 mg by mouth 2 (two) times daily.        . nitroGLYCERIN (NITROSTAT) 0.4 MG SL tablet Place 0.4 mg under the tongue every 5 (five) minutes as needed.        . pravastatin (PRAVACHOL) 40 MG tablet Take 40 mg by mouth daily.        . budesonide-formoterol (SYMBICORT) 160-4.5 MCG/ACT inhaler Inhale 2 puffs into the lungs 2 (two) times daily.  3 Inhaler  3       Review of Systems  Constitutional: Negative for fever and unexpected weight change.  HENT: Positive for voice change. Negative for ear pain, nosebleeds, congestion, sore throat, rhinorrhea, sneezing, trouble swallowing, dental problem, postnasal drip and sinus pressure.   Eyes: Negative for redness and itching.  Respiratory: Positive for cough and shortness of breath. Negative for chest tightness and wheezing.   Cardiovascular: Negative for palpitations and leg swelling.  Gastrointestinal: Negative for nausea and vomiting.  Genitourinary: Negative for dysuria.  Musculoskeletal: Negative for joint swelling.    Skin: Negative for rash.  Neurological: Negative for headaches.  Hematological: Does not bruise/bleed easily.  Psychiatric/Behavioral: Negative for dysphoric mood. The patient is not nervous/anxious.        Objective:   Physical Exam  Nursing note and vitals reviewed. Constitutional: He is oriented to person, place, and time. He appears well-developed and well-nourished. No distress.  HENT:  Head: Normocephalic and atraumatic.  Right Ear: External ear normal.  Left Ear: External ear normal.  Mouth/Throat: Oropharynx is clear and moist. No oropharyngeal exudate.       HOARSE VOICE HARD OF HEARING  Eyes: Conjunctivae and EOM are normal. Pupils are equal, round, and reactive to light. Right eye exhibits no discharge. Left eye exhibits no discharge. No scleral icterus.  Neck: Normal range of motion. Neck supple. No JVD present. No tracheal deviation present. No thyromegaly present.  Cardiovascular: Normal rate, regular rhythm and intact distal pulses.  Exam reveals no gallop and no friction rub.   No murmur heard. Pulmonary/Chest: Effort normal and breath sounds normal. No respiratory distress. He has no wheezes. He has no rales. He exhibits no tenderness.  Abdominal: Soft. Bowel sounds are normal. He exhibits no distension and no mass. There is no tenderness. There is no rebound and no guarding.  Musculoskeletal: Normal range of motion. He exhibits no edema and no tenderness.  Lymphadenopathy:    He has no cervical adenopathy.  Neurological: He is alert and oriented to person, place, and time. He has normal reflexes. No cranial nerve deficit. Coordination normal.  Skin: Skin is warm and dry. No rash noted. He is not diaphoretic. No erythema. No pallor.  Psychiatric: He has a normal mood and affect. His behavior is normal. Judgment and thought content normal.          Assessment & Plan:

## 2011-06-17 NOTE — Patient Instructions (Addendum)
Please see ENT in [facility] group for hoarseness - Drs Annalee Genta or Jenne Pane or Dr Suszanne Conners Please have breathing test called full PFT - this is better than doing screening test in the office cubicle which you already had Please return to see me after above

## 2011-06-26 ENCOUNTER — Ambulatory Visit (INDEPENDENT_AMBULATORY_CARE_PROVIDER_SITE_OTHER)
Admission: RE | Admit: 2011-06-26 | Discharge: 2011-06-26 | Disposition: A | Discharge status: Home / Self Care | Payer: Medicare PPO | Source: Ambulatory Visit

## 2011-06-26 DIAGNOSIS — Z13820 Encounter for screening for osteoporosis: Secondary | ICD-10-CM

## 2011-06-26 DIAGNOSIS — E291 Testicular hypofunction: Secondary | ICD-10-CM

## 2011-07-02 ENCOUNTER — Ambulatory Visit (INDEPENDENT_AMBULATORY_CARE_PROVIDER_SITE_OTHER): Payer: Medicare PPO | Admitting: Internal Medicine

## 2011-07-02 DIAGNOSIS — J449 Chronic obstructive pulmonary disease, unspecified: Secondary | ICD-10-CM

## 2011-07-02 DIAGNOSIS — R490 Dysphonia: Secondary | ICD-10-CM

## 2011-07-02 DIAGNOSIS — J4489 Other specified chronic obstructive pulmonary disease: Secondary | ICD-10-CM

## 2011-07-02 LAB — PULMONARY FUNCTION TEST

## 2011-07-02 NOTE — Progress Notes (Signed)
PFT done today. 

## 2011-07-03 ENCOUNTER — Encounter: Payer: Self-pay | Admitting: Internal Medicine

## 2011-07-10 ENCOUNTER — Telehealth: Payer: Self-pay

## 2011-07-10 ENCOUNTER — Telehealth: Payer: Self-pay | Admitting: Internal Medicine

## 2011-07-10 NOTE — Telephone Encounter (Signed)
Pt aware of normal bone density results

## 2011-07-10 NOTE — Telephone Encounter (Signed)
Let him know pft 07/02/11 is suggestive of asthma. I need him to finish his ENT appt and then see me

## 2011-07-10 NOTE — Telephone Encounter (Signed)
lmomtcb x1 

## 2011-07-10 NOTE — Telephone Encounter (Signed)
Message copied by Beverely Low on Fri Jul 10, 2011  9:28 AM ------      Message from: Phillip Jordan      Created: Fri Jul 10, 2011  8:09 AM       Advised patient, bone density test normal. Good results

## 2011-07-13 ENCOUNTER — Ambulatory Visit (INDEPENDENT_AMBULATORY_CARE_PROVIDER_SITE_OTHER): Payer: Medicare PPO | Admitting: Internal Medicine

## 2011-07-13 ENCOUNTER — Encounter: Payer: Self-pay | Admitting: Internal Medicine

## 2011-07-13 VITALS — BP 130/68 | HR 68 | Temp 97.5°F | Ht 69.0 in | Wt 176.4 lb

## 2011-07-13 DIAGNOSIS — J449 Chronic obstructive pulmonary disease, unspecified: Secondary | ICD-10-CM

## 2011-07-13 NOTE — Telephone Encounter (Signed)
Informed spouse of MR recs. Pt has been seen by Dr. Suszanne Conners and is coming in today to be seen at 3pm for follow up and spouse states work up with ENT is complete.

## 2011-07-13 NOTE — Patient Instructions (Addendum)
#  COPD/ASthma -Your breathing test is similar to 3 years ago with Dr Shelle Iron -Your lung capacity in the first second of blowing out is 1.41L which is 55% predicted -There is still rooom for improvement based on breathing tests -Based on our discussion, I respect your desire to continue with dulera without changes like adding singulair to help improve function or change to nebs in an  effort to reduce hoarseness -Please continue your exercise program as always -Please give blood test for alpha 1 genetic test for copd/asthma; please call in 1 month for results -Congratulations on flu shot and pneumovax #Hoarseness - reviewed Dr Suszanne Conners notes, Followup based on your discussion with him #Followup  - Based on our discussions, we will leave followup open - cancel 08/03/11 ov

## 2011-07-13 NOTE — Progress Notes (Signed)
Subjective:    Patient ID: Phillip Jordan, male    DOB: October 29, 1929, 77 y.o.   MRN: [id number]  HPI IOV 06/17/11: 77 year old male, ex-smoker (1ppd x 30 years, quit 1990s), diagnosed with COPD per hx by Dr Shelle Iron (fev1 1.38L/44%, ratio 51 in Jan 2009). This is new visit since last visit here was > 3 years ago. He is maintained on dulera and ? spiriva  Reports 3 years of insidious onset new hoarseness. 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 He is not sure if singing is making hoarseness worse but feels current hoarseness thoug moderate in severity is interefering with singing inability. No associated pain but there is associated dysphagia for pills. Also reports dyspnea, cough, wheeze due to copd but this is currently mild and stable. There is post nasal drip but Rx for this has not helped hoarseness. REcollects that symbicort made hoarseness worse and is now on dulera which while has not made hoarseness worse has not helped it either. No fever. Not on spiriva anymore because this made hoarseness worse. Fam hx and social hx: dtr has vocal cord nodules - is a [employer] representative. Past med: denies gerd but is ex-smoker    OV 07/13/2011: followup after PFT and ENT visit. PFTs show unchnaged fev1. Fev1 is 1.4L/55%, ratio 39, 12% BD respoonse, normal DLCO, TLC 120%. This is c/w moderate persistent asthma but given prior smoking hx and lack of fev1 change in 3 years this could be remodeled asthma or copd. He saw Dr Suszanne Conners 07/04/11 and has been diagnosd with presbylarynges (bowing). He has deferred surgical Rx and per patient this could be contributing to hoarseness of voice. Currently exercising. Notes stable health. Class 1 dyspnea. Some limitation with breath hold while singiing. He is content with level of lung function and has no interest in improving it further. He has accepted his hoarseness of voice and is not interested in altering Rx regiments in an  effort to modify hoarseness.    Review of Systems  Constitutional: Negative for fever and unexpected weight change.  HENT: Negative for ear pain, nosebleeds, congestion, sore throat, rhinorrhea, sneezing, trouble swallowing, dental problem, postnasal drip and sinus pressure.   Eyes: Negative for redness and itching.  Respiratory: Negative for cough, chest tightness, shortness of breath and wheezing.   Cardiovascular: Negative for palpitations and leg swelling.  Gastrointestinal: Negative for nausea and vomiting.  Genitourinary: Negative for dysuria.  Musculoskeletal: Negative for joint swelling.  Skin: Negative for rash.  Neurological: Negative for headaches.  Hematological: Does not bruise/bleed easily.  Psychiatric/Behavioral: Negative for dysphoric mood. The patient is not nervous/anxious.        Objective:   Physical Exam Nursing note and vitals reviewed. Constitutional: He is oriented to person, place, and time. He appears well-developed and well-nourished. No distress.  HENT:  Head: Normocephalic and atraumatic.  Right Ear: External ear normal.  Left Ear: External ear normal.  Mouth/Throat: Oropharynx is clear and moist. No oropharyngeal exudate.       HOARSE VOICE HARD OF HEARING  Eyes: Conjunctivae and EOM are normal. Pupils are equal, round, and reactive to light. Right eye exhibits no discharge. Left eye exhibits no discharge. No scleral icterus.  Neck: Normal range of motion. Neck supple. No JVD present. No tracheal deviation present. No thyromegaly present.  Cardiovascular: Normal rate, regular rhythm and intact distal pulses.  Exam reveals no gallop and no friction rub.   No murmur  heard. Pulmonary/Chest: Effort normal and breath sounds normal. No respiratory distress. He has no wheezes. He has no rales. He exhibits no tenderness.  Abdominal: Soft. Bowel sounds are normal. He exhibits no distension and no mass. There is no tenderness. There is no rebound and no  guarding.  Musculoskeletal: Normal range of motion. He exhibits no edema and no tenderness.  Lymphadenopathy:    He has no cervical adenopathy.  Neurological: He is alert and oriented to person, place, and time. He has normal reflexes. No cranial nerve deficit. Coordination normal.  Skin: Skin is warm and dry. No rash noted. He is not diaphoretic. No erythema. No pallor.  Psychiatric: He has a normal mood and affect. His behavior is normal. Judgment and thought content normal.         Assessment & Plan:

## 2011-07-13 NOTE — Assessment & Plan Note (Signed)
#  COPD/ASthma: stable disease. Gold stage 2-3 with BD respoonse + -Your breathing test is similar to 3 years ago with Dr Shelle Iron -Your lung capacity in the first second of blowing out is 1.41L which is 55% predicted -There is still rooom for improvement based on breathing tests -Based on our discussion, I respect your desire to continue with dulera without changes like adding singulair to help improve function or change to nebs in an  effort to reduce hoarseness -Please continue your exercise program as always -Please give blood test for alpha 1 genetic test for copd/asthma; please call in 1 month for results -Congratulations on flu shot and pneumovax - ROV prn per his wish  > 15 min visit. > 50% in face to face counseling

## 2011-07-23 ENCOUNTER — Ambulatory Visit: Payer: Medicare PPO | Admitting: Internal Medicine

## 2011-08-03 ENCOUNTER — Ambulatory Visit: Payer: Medicare PPO | Admitting: Internal Medicine

## 2011-08-07 ENCOUNTER — Ambulatory Visit (INDEPENDENT_AMBULATORY_CARE_PROVIDER_SITE_OTHER): Payer: Medicare PPO | Admitting: Family Medicine

## 2011-08-07 ENCOUNTER — Encounter: Payer: Self-pay | Admitting: Family Medicine

## 2011-08-07 DIAGNOSIS — J40 Bronchitis, not specified as acute or chronic: Secondary | ICD-10-CM

## 2011-08-07 DIAGNOSIS — J449 Chronic obstructive pulmonary disease, unspecified: Secondary | ICD-10-CM

## 2011-08-07 MED ORDER — AZITHROMYCIN 250 MG PO TABS: 250.0000 mg | ORAL_TABLET | Freq: Every day | ORAL | Status: AC

## 2011-08-07 MED ORDER — GUAIFENESIN-CODEINE 100-10 MG/5ML PO SYRP: 10.0000 mL | ORAL_SOLUTION | Freq: Three times a day (TID) | ORAL | Status: AC | PRN

## 2011-08-07 MED ORDER — PREDNISONE 20 MG PO TABS: ORAL_TABLET | ORAL | Status: DC

## 2011-08-07 NOTE — Progress Notes (Signed)
  Subjective:    Patient ID: Phillip Jordan, male    DOB: 13-Jul-1930, 77 y.o.   MRN: [id number]  HPI Cough- sxs started Tuesday.  Wife has been sick.  Hx of COPD.  On Dulera and albturol.  No fevers.  Taking mucinex but cough is dry.  + wheezing.  Minimal nasal congestion.   Review of Systems For ROS see HPI     Objective:   Physical Exam  Vitals reviewed. Constitutional: He appears well-developed and well-nourished. No distress.  HENT:  Head: Normocephalic and atraumatic.  Right Ear: Tympanic membrane normal.  Left Ear: Tympanic membrane normal.  Nose: No mucosal edema or rhinorrhea. Right sinus exhibits no maxillary sinus tenderness and no frontal sinus tenderness. Left sinus exhibits no maxillary sinus tenderness and no frontal sinus tenderness.  Mouth/Throat: Mucous membranes are normal. No oropharyngeal exudate, posterior oropharyngeal edema or posterior oropharyngeal erythema.  Eyes: Conjunctivae and EOM are normal. Pupils are equal, round, and reactive to light.  Neck: Normal range of motion. Neck supple.  Cardiovascular: Normal rate, regular rhythm and normal heart sounds.   Pulmonary/Chest: Effort normal. No respiratory distress. He has wheezes (diffuse expiratory wheezes).       + hacking cough  Lymphadenopathy:    He has no cervical adenopathy.  Skin: Skin is warm and dry.          Assessment & Plan:

## 2011-08-07 NOTE — Patient Instructions (Signed)
This is a bronchitis Start the Zpack as directed Take the Prednisone 2 tabs daily at the same time (w/ food) for 10 days Use the cough meds as needed Drink plenty of fluids REST! Call with any questions or concerns Hang in there!

## 2011-08-09 NOTE — Assessment & Plan Note (Signed)
New.  Start abx given underlying lung dz.  Reviewed supportive care and red flags that should prompt return.  Pt expressed understanding and is in agreement w/ plan.

## 2011-08-09 NOTE — Assessment & Plan Note (Signed)
Deteriorated.  Start prednisone and abx.  Encouraged regular use of dulera and albuterol prn.  Reviewed supportive care and red flags that should prompt return.  Pt expressed understanding and is in agreement w/ plan.

## 2011-09-07 ENCOUNTER — Encounter: Payer: Self-pay | Admitting: Internal Medicine

## 2011-09-29 ENCOUNTER — Ambulatory Visit (INDEPENDENT_AMBULATORY_CARE_PROVIDER_SITE_OTHER): Payer: Medicare Other | Admitting: Cardiovascular Disease

## 2011-09-29 ENCOUNTER — Encounter: Payer: Self-pay | Admitting: Cardiovascular Disease

## 2011-09-29 DIAGNOSIS — E785 Hyperlipidemia, unspecified: Secondary | ICD-10-CM

## 2011-09-29 DIAGNOSIS — E119 Type 2 diabetes mellitus without complications: Secondary | ICD-10-CM

## 2011-09-29 DIAGNOSIS — I4510 Unspecified right bundle-branch block: Secondary | ICD-10-CM

## 2011-09-29 DIAGNOSIS — I2510 Atherosclerotic heart disease of native coronary artery without angina pectoris: Secondary | ICD-10-CM

## 2011-09-29 DIAGNOSIS — I7100 Dissection of unspecified site of aorta: Secondary | ICD-10-CM

## 2011-09-29 DIAGNOSIS — J449 Chronic obstructive pulmonary disease, unspecified: Secondary | ICD-10-CM

## 2011-09-29 NOTE — Assessment & Plan Note (Signed)
Stable with no angina and good activity level.  Continue medical Rx  

## 2011-09-29 NOTE — Progress Notes (Signed)
Phillip Jordan seen today in followup for his coronary artery disease, AAA with distal dissection, and some mild shortness of breath. He also has a chronic right bundle branch block. He has not any significant chest pain. His last Myoview was Dec 29, 2007 there is a fixed defect in the inferior wall suggesting possible previous infarction with no really significant ischemia. His EF was 69%. He's had previous stents to the RCA and circumflex. His last catheter was in July of 2005. At that time he received 2 stents to the RCA. The circumflex coronary artery stent was done prior to 2005. He is active with his grandchildren and silver sneakers at the [facility]. [name] He has no angiina PND orthopnea no palpitations or syncope.  Needs a fasting lipid and liver today.   Reviewed Abdominal US from 2/12 and Ao only 2.3 by 2.5 cm and distal disection not visible with no false lumen  ROS: Denies fever, malais, weight loss, blurry vision, decreased visual acuity, cough, sputum, SOB, hemoptysis, pleuritic pain, palpitaitons, heartburn, abdominal pain, melena, lower extremity edema, claudication, or rash.  All other systems reviewed and negative  General: Affect appropriate Healthy:  appears stated age HEENT: normal Neck supple with no adenopathy JVP normal no bruits no thyromegaly Lungs clear on left with expitory wheezing on right and good diaphragmatic motion Heart:  S1/S2 no murmur, no rub, gallop or click PMI normal Abdomen: benighn, BS positve, no tenderness, no AAA no bruit.  No HSM or HJR Distal pulses intact with no bruits No edema Neuro non-focal Skin warm and dry No muscular weakness   Current Outpatient Prescriptions  Medication Sig Dispense Refill  . albuterol (VENTOLIN HFA) 108 (90 BASE) MCG/ACT inhaler Inhale 2 puffs into the lungs every 6 (six) hours as needed.        [name] aspirin 325 MG tablet Take 325 mg by mouth daily.        . clopidogrel (PLAVIX) 75 MG tablet Take 1 tablet (75 mg total) by mouth  daily.  90 tablet  3  . fish oil-omega-3 fatty acids 1000 MG capsule Take 2 g by mouth daily.        [name] guaiFENesin-codeine (ROBITUSSIN AC) 100-10 MG/5ML syrup Take 10 mLs by mouth 3 (three) times daily as needed for cough.  240 mL  0  . isosorbide mononitrate (IMDUR) 60 MG 24 hr tablet Take 60 mg by mouth daily.        . metFORMIN (GLUCOPHAGE) 500 MG tablet Take 500 mg by mouth daily with breakfast.        . metoprolol tartrate (LOPRESSOR) 25 MG tablet Take 25 mg by mouth 2 (two) times daily.        . mometasone-formoterol (DULERA) 100-5 MCG/ACT AERO Inhale 2 puffs into the lungs 2 (two) times daily.        . nitroGLYCERIN (NITROSTAT) 0.4 MG SL tablet Place 0.4 mg under the tongue every 5 (five) minutes as needed.        . pravastatin (PRAVACHOL) 40 MG tablet Take 40 mg by mouth daily.        . predniSONE (DELTASONE) 20 MG tablet 2 tabs daily x10 day.  Take w/ food.  20 tablet  0    Allergies  Review of patient's allergies indicates no known allergies.  Electrocardiogram:  NSR rate 57  RBBB possible old IMI  No change from prev.  Assessment and Plan

## 2011-09-29 NOTE — Assessment & Plan Note (Signed)
Cholesterol is at goal.  Continue current dose of statin and diet Rx.  No myalgias or side effects.  F/U  LFT's in 6 months. Lab Results  Component Value Date   LDLCALC 78 09/26/2010

## 2011-09-29 NOTE — Assessment & Plan Note (Signed)
F/U duplex in 6 months Has been stable over many years

## 2011-09-29 NOTE — Patient Instructions (Signed)
Your physician wants you to follow-up in:  6 MONTHS WITH DR NISHAN  You will receive a reminder letter in the mail two months in advance. If you don't receive a letter, please call our office to schedule the follow-up appointment. Your physician recommends that you continue on your current medications as directed. Please refer to the Current Medication list given to you today. 

## 2011-09-29 NOTE — Assessment & Plan Note (Signed)
Stable no evidence of advanced AV block and no dizzyness or syncope  Yearly ECG

## 2011-09-29 NOTE — Assessment & Plan Note (Signed)
Recent flair on prednisone taper  Plan per Dr Beverely Low

## 2011-09-29 NOTE — Assessment & Plan Note (Signed)
Discussed low carb diet.  Target hemoglobin A1c is 6.5 or less.  Continue current medications.  

## 2011-10-01 ENCOUNTER — Other Ambulatory Visit: Payer: Self-pay | Admitting: *Deleted

## 2011-10-01 MED ORDER — PRAVASTATIN SODIUM 40 MG PO TABS: 40.0000 mg | ORAL_TABLET | Freq: Every day | ORAL | Status: DC

## 2011-10-01 MED ORDER — METFORMIN HCL 500 MG PO TABS: 500.0000 mg | ORAL_TABLET | Freq: Every day | ORAL | Status: DC

## 2011-10-01 MED ORDER — CLOPIDOGREL BISULFATE 75 MG PO TABS: 75.0000 mg | ORAL_TABLET | Freq: Every day | ORAL | Status: DC

## 2011-10-01 MED ORDER — ISOSORBIDE MONONITRATE ER 60 MG PO TB24: 60.0000 mg | ORAL_TABLET | Freq: Every day | ORAL | Status: DC

## 2011-10-01 MED ORDER — ALBUTEROL SULFATE HFA 108 (90 BASE) MCG/ACT IN AERS: 2.0000 | INHALATION_SPRAY | Freq: Four times a day (QID) | RESPIRATORY_TRACT | Status: DC | PRN

## 2011-10-01 MED ORDER — METOPROLOL TARTRATE 25 MG PO TABS: 25.0000 mg | ORAL_TABLET | Freq: Two times a day (BID) | ORAL | Status: DC

## 2011-10-01 NOTE — Telephone Encounter (Signed)
Addended by: Edwena Felty T on: 10/01/2011 01:07 PM   Modules accepted: Orders

## 2011-10-01 NOTE — Telephone Encounter (Signed)
Resent to [facility] 

## 2011-10-01 NOTE — Telephone Encounter (Signed)
Pt wife called back stating that she just received a call from [facility] stating that Rx were ready. Pt wife indicated that Rx need to [facility]. Pt wife will cancel Rx at [facility] but Rx need to be sent to correct pharmacy.

## 2011-10-01 NOTE — Telephone Encounter (Signed)
Refill done.  

## 2011-10-12 ENCOUNTER — Other Ambulatory Visit: Payer: Self-pay | Admitting: Cardiology

## 2011-10-12 ENCOUNTER — Telehealth: Payer: Self-pay | Admitting: Internal Medicine

## 2011-10-12 DIAGNOSIS — I714 Abdominal aortic aneurysm, without rupture: Secondary | ICD-10-CM

## 2011-10-12 NOTE — Telephone Encounter (Signed)
Darl Pikes from [facility] has questions about 4 different prescriptions that were sent to her for this patient. The number listed is her direct line.

## 2011-10-12 NOTE — Telephone Encounter (Signed)
Spoke with susan & answered questions.

## 2011-10-12 NOTE — Telephone Encounter (Signed)
Left msg for Darl Pikes to call back.

## 2011-10-15 ENCOUNTER — Encounter (INDEPENDENT_AMBULATORY_CARE_PROVIDER_SITE_OTHER): Payer: Medicare Other

## 2011-10-15 DIAGNOSIS — I714 Abdominal aortic aneurysm, without rupture: Secondary | ICD-10-CM

## 2011-10-15 DIAGNOSIS — I700 Atherosclerosis of aorta: Secondary | ICD-10-CM

## 2011-11-24 ENCOUNTER — Ambulatory Visit (INDEPENDENT_AMBULATORY_CARE_PROVIDER_SITE_OTHER): Payer: Medicare Other | Admitting: Internal Medicine

## 2011-11-24 ENCOUNTER — Encounter: Payer: Self-pay | Admitting: Internal Medicine

## 2011-11-24 VITALS — BP 122/74 | HR 50 | Temp 97.3°F | Wt 177.0 lb

## 2011-11-24 DIAGNOSIS — E785 Hyperlipidemia, unspecified: Secondary | ICD-10-CM

## 2011-11-24 DIAGNOSIS — E119 Type 2 diabetes mellitus without complications: Secondary | ICD-10-CM

## 2011-11-24 DIAGNOSIS — J449 Chronic obstructive pulmonary disease, unspecified: Secondary | ICD-10-CM

## 2011-11-24 DIAGNOSIS — J4489 Other specified chronic obstructive pulmonary disease: Secondary | ICD-10-CM

## 2011-11-24 DIAGNOSIS — J309 Allergic rhinitis, unspecified: Secondary | ICD-10-CM

## 2011-11-24 MED ORDER — MONTELUKAST SODIUM 10 MG PO TABS: 10.0000 mg | ORAL_TABLET | Freq: Every day | ORAL | Status: DC

## 2011-11-24 MED ORDER — BECLOMETHASONE DIPROPIONATE 80 MCG/ACT IN AERS: 2.0000 | INHALATION_SPRAY | Freq: Two times a day (BID) | RESPIRATORY_TRACT | Status: DC

## 2011-11-24 NOTE — Assessment & Plan Note (Signed)
At goal  per last FLP, due for labs. See instructions

## 2011-11-24 NOTE — Assessment & Plan Note (Signed)
Asthma/COPD,not well controlled chronically, in part because poor compliance with medications.  Current is not taking anything but albuterol. Last chest x-ray  05-2011. Pulmonary suggested possibly Singulair. Plan:  Re start Qvar, add singulair , treated allergies with Claritin. Reassess in 6 weeks

## 2011-11-24 NOTE — Progress Notes (Signed)
  Subjective:    Patient ID: Phillip Jordan, male    DOB: 22-Mar-1930, 78 y.o.   MRN: [id number]  HPI Routine office visit Coronary artery disease, last visit note from cardiology reviewed, he was stable. Good medication compliance. 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 Diabetes, good medication compliance, but sugars around 105-120. History of AAA, recent ultrasound stable  Past Medical History  Diagnosis Date  . COPD with asthma   . CAD (coronary artery disease)     s/p MI 38  . Diabetes mellitus   . Hyperlipidemia   . Allergic rhinitis   . Aortic dissection     f/u by cards   Family History:  prostate ca--no  colon ca--no  Asthma: mother  Heart disease: mother (CHF) father (M.I.)  Cancer: mother (melanoma)   Social History:  Married,  3 kids, 8 gk  Does mission work.  Patient states former smoker. (quit 1994)  pt is retired from [employer].  Alcohol use-no  Drug use-no     Review of Systems No nausea, vomiting, diarrhea. No blood in the stools. No GERD symptoms. Denies any fever chills, no major issues with fatigue. No chest pain or edema.     Objective:   Physical Exam  General -- alert, well-developed, and well-nourished. NAD  Lungs -- normal respiratory effort, no intercostal retractions, no accessory muscle use; + wheezing B, few ronchi, no crackles today Heart-- normal rate, regular rhythm, no murmur, and no gallop.   Extremities-- no pretibial edema bilaterally       Assessment & Plan:

## 2011-11-24 NOTE — Assessment & Plan Note (Signed)
Good medication compliance, has a history of retinopathy due to diabetes. Not on ACEi Plan: Check the A1c and a microalbumin

## 2011-11-24 NOTE — Assessment & Plan Note (Signed)
Having some symptoms lately. Recommend Claritin.

## 2011-11-24 NOTE — Patient Instructions (Addendum)
Start Claritin 10 mg over-the-counter once a day to help with allergies Add Qvar 2 puffs twice a day add singular one tablet daily. I sent a three-month supply of Qvar and Singulair to [facility] You have samples for Qvar and a a paper prescription for Singulair -------------------------------------------- Schedule  fasting labs. BMP ---- dx CAD FLP, AST, ALT---- dx  high cholesterol Hemoglobin A1c, microalbumin---- dx  Diabetes ------------------------------------------- Next visit in 6 weeks

## 2011-12-01 ENCOUNTER — Other Ambulatory Visit (INDEPENDENT_AMBULATORY_CARE_PROVIDER_SITE_OTHER): Payer: Medicare Other

## 2011-12-01 DIAGNOSIS — E7800 Pure hypercholesterolemia, unspecified: Secondary | ICD-10-CM

## 2011-12-01 DIAGNOSIS — E119 Type 2 diabetes mellitus without complications: Secondary | ICD-10-CM

## 2011-12-01 LAB — MICROALBUMIN / CREATININE URINE RATIO
Creatinine,U: 77.5 mg/dL
Microalb, Ur: 1.9 mg/dL (ref 0.0–1.9)

## 2011-12-01 LAB — AST: AST: 22 U/L (ref 0–37)

## 2011-12-01 LAB — LIPID PANEL
Total CHOL/HDL Ratio: 4
Triglycerides: 88 mg/dL (ref 0.0–149.0)

## 2011-12-03 LAB — HEMOGLOBIN A1C: Hgb A1c MFr Bld: 6.5 % (ref 4.6–6.5)

## 2011-12-07 ENCOUNTER — Encounter: Payer: Self-pay | Admitting: *Deleted

## 2012-01-05 ENCOUNTER — Encounter: Payer: Self-pay | Admitting: Internal Medicine

## 2012-01-05 ENCOUNTER — Ambulatory Visit (INDEPENDENT_AMBULATORY_CARE_PROVIDER_SITE_OTHER): Payer: Medicare Other | Admitting: Internal Medicine

## 2012-01-05 ENCOUNTER — Ambulatory Visit (INDEPENDENT_AMBULATORY_CARE_PROVIDER_SITE_OTHER)
Admission: RE | Admit: 2012-01-05 | Discharge: 2012-01-05 | Disposition: A | Discharge status: Home / Self Care | Payer: Medicare Other | Source: Ambulatory Visit | Attending: Internal Medicine | Admitting: Internal Medicine

## 2012-01-05 VITALS — BP 144/82 | HR 47 | Temp 97.6°F | Wt 175.0 lb

## 2012-01-05 DIAGNOSIS — J449 Chronic obstructive pulmonary disease, unspecified: Secondary | ICD-10-CM

## 2012-01-05 DIAGNOSIS — I2510 Atherosclerotic heart disease of native coronary artery without angina pectoris: Secondary | ICD-10-CM

## 2012-01-05 DIAGNOSIS — J4489 Other specified chronic obstructive pulmonary disease: Secondary | ICD-10-CM

## 2012-01-05 LAB — CBC WITH DIFFERENTIAL/PLATELET
Eosinophils Absolute: 0.5 10*3/uL (ref 0.0–0.7)
MCHC: 33.5 g/dL (ref 30.0–36.0)
MCV: 91 fl (ref 78.0–100.0)
Monocytes Absolute: 1 10*3/uL (ref 0.1–1.0)
Neutrophils Relative %: 63.3 % (ref 43.0–77.0)
Platelets: 248 10*3/uL (ref 150.0–400.0)
WBC: 10 10*3/uL (ref 4.5–10.5)

## 2012-01-05 LAB — BASIC METABOLIC PANEL
GFR: 83.16 mL/min (ref >60.00)
Potassium: 5.6 mEq/L — ABNORMAL HIGH (ref 3.5–5.1)
Sodium: 141 mEq/L (ref 135–145)

## 2012-01-05 MED ORDER — MOMETASONE FURO-FORMOTEROL FUM 100-5 MCG/ACT IN AERO: 2.0000 | INHALATION_SPRAY | Freq: Two times a day (BID) | RESPIRATORY_TRACT | Status: DC

## 2012-01-05 MED ORDER — LOSARTAN POTASSIUM 25 MG PO TABS: 25.0000 mg | ORAL_TABLET | Freq: Every day | ORAL | Status: DC

## 2012-01-05 NOTE — Assessment & Plan Note (Addendum)
Compared to last visit, he sounds less congested however he has more dyspnea on exertion. See comments under CAD. Needs better control. Stop Qvar, start duelera again. Chest x-ray Reassess in 4 weeks, pulmonary referral?

## 2012-01-05 NOTE — Patient Instructions (Signed)
Please get your x-ray at the other [facility]  office located at: [address] [place], across from [facility].  Please go to the basement, this is a walk-in facility, they are open from 8:30 to 5:30 PM. Phone number [phone]. ----- Metoprolol, take only half tablet twice a day Start losartan 1 tablet daily Stop Qvar, start dulera 2 puffs twice a day ER if you have severe symptoms

## 2012-01-05 NOTE — Progress Notes (Signed)
  Subjective:    Patient ID: Phillip Jordan, male    DOB: [birth year], 78 y.o.   MRN: [id number]  HPI Routine followup COPD--medications were adjusted at the last visit, he continued to use albuterol before exercises, not using albuterol inhaler at other  times. Despite adjustment of medicines, he feels more dyspnea on exertion the last 5-6 weeks.  History of hypertension and CAD-- good medication compliance, from time to time he feels really weak and during those periods his BP has been noted to be low, systolic blood pressure sometimes even less than 100 and a heart rate of 44. These episodes are not associated with other symptoms, see review of systems.  Diabetes, good medication compliance  Past Medical History  Diagnosis Date  . COPD with asthma   . CAD (coronary artery disease)     s/p MI 16  . Diabetes mellitus   . Hyperlipidemia   . Allergic rhinitis   . Aortic dissection     f/u by cards     Family History:  prostate ca--no  colon ca--no  Asthma: mother  Heart disease: mother (CHF) father (M.I.)  Cancer: mother (melanoma)  Social History:  Married, 3 kids, 8 gk  Does mission work.  Patient states former smoker. (quit 1994)  pt is retired from [employer].  Alcohol use-no  Drug use-no   Review of Systems Denies chest pain, lower extremity edema, orthopnea. Has not used nitroglycerin. No recent airplane trips. With episodes of weakness, he does not have syncope or presyncope feeling. No fever or chills Cough is at baseline, dry and infrequent.     Objective:   Physical Exam General -- alert, well-developed. No apparent distress.  Neck --no JVD at 45 Lungs -- normal respiratory effort, no intercostal retractions, no accessory muscle use; good breath sounds throughout, some wheezing at bases with few rhonchi. No crackles Heart-- normal rate, regular rhythm, no murmur, and no gallop.   Abdomen--soft, non-tender, no distention, no masses, no HSM, no  guarding, and no rigidity.   Extremities-- no pretibial edema bilaterally  Neurologic-- alert & oriented X3 and strength normal in all extremities. Psych-- Cognition and judgment appear intact. Alert and cooperative with normal attention span and concentration.  not anxious appearing and not depressed appearing.      Assessment & Plan:

## 2012-01-05 NOTE — Assessment & Plan Note (Addendum)
Patient with history of CAD, diabetes and COPD presents with episodes of low blood pressure, bradycardia, weakness. Also more dyspnea on exertion in the last few weeks despite increasing his COPD medication EKG today showing sinus bradycardia, no acute changes. Symptoms could be angina equivalent or excessive betablockers. Plan: Decrease beta blockers, add a low dose of losartan Chest x-ray  Continue working on COPD treatment Labs Refer to cardiology ER if symptoms severe

## 2012-01-06 ENCOUNTER — Encounter: Payer: Self-pay | Admitting: *Deleted

## 2012-01-06 ENCOUNTER — Telehealth: Payer: Self-pay | Admitting: *Deleted

## 2012-01-06 NOTE — Progress Notes (Signed)
Addended by: Edwena Felty T on: 01/06/2012 03:31 PM   Modules accepted: Orders

## 2012-01-07 NOTE — Telephone Encounter (Signed)
Error

## 2012-01-25 ENCOUNTER — Other Ambulatory Visit (INDEPENDENT_AMBULATORY_CARE_PROVIDER_SITE_OTHER): Payer: Medicare Other

## 2012-01-25 DIAGNOSIS — I10 Essential (primary) hypertension: Secondary | ICD-10-CM

## 2012-01-25 LAB — POTASSIUM: Potassium: 5.1 mEq/L (ref 3.5–5.1)

## 2012-01-25 NOTE — Progress Notes (Signed)
Labs only

## 2012-01-27 ENCOUNTER — Encounter: Payer: Self-pay | Admitting: *Deleted

## 2012-02-02 ENCOUNTER — Encounter: Payer: Self-pay | Admitting: Internal Medicine

## 2012-02-02 ENCOUNTER — Ambulatory Visit (INDEPENDENT_AMBULATORY_CARE_PROVIDER_SITE_OTHER): Payer: Medicare Other | Admitting: Internal Medicine

## 2012-02-02 VITALS — BP 128/82 | HR 58 | Temp 97.7°F | Wt 174.0 lb

## 2012-02-02 DIAGNOSIS — I2510 Atherosclerotic heart disease of native coronary artery without angina pectoris: Secondary | ICD-10-CM

## 2012-02-02 DIAGNOSIS — J449 Chronic obstructive pulmonary disease, unspecified: Secondary | ICD-10-CM

## 2012-02-02 LAB — BASIC METABOLIC PANEL
CO2: 31 mEq/L (ref 19–32)
Calcium: 9.4 mg/dL (ref 8.4–10.5)
Creatinine, Ser: 0.9 mg/dL (ref 0.4–1.5)
Sodium: 143 mEq/L (ref 135–145)

## 2012-02-02 NOTE — Assessment & Plan Note (Signed)
Was switch from Qvar to dulera, good compliance, dulera cost is an issue. Improving COPD could be from [facility] or  decrease on beta blockers. Plan: No change. Samples provided.

## 2012-02-02 NOTE — Progress Notes (Signed)
  Subjective:    Patient ID: Phillip Jordan, male    DOB: Jun 30, 1930, 78 y.o.   MRN: [id number]  HPI Followup from previous visit, In general he feels better. Since the last time he was here, his potassium was slightly elevated, it was rechecked and came back normal. Chest x-ray  was within normal. Past Medical History  Diagnosis Date  . COPD with asthma   . CAD (coronary artery disease)     s/p MI 45  . Diabetes mellitus   . Hyperlipidemia   . Allergic rhinitis   . Aortic dissection     f/u by cards   Past Surgical History  Procedure Date  . Back surgery 1993  . Cardiac catheterization     left, w/ coronary angiography and left ventriculograpy  . Ptca     w/ placement of drugeluting stent in the distal right coronary artery, and distal Cardiologist: Loraine Leriche W.Pulsipher    Review of Systems Good compliance with medicines as prescribed. Dyspnea on exertion has decreased to some extent, he is now using dulera bid and essentially not needing albuterol. Episodes of weakness decrease but not completely gone Good compliance with Cozaar which is a new medication for him, ambulatory BPs around 122/82. Denies any chest pain or edema.     Objective:   Physical Exam  General -- alert, well-developed. No apparent distress.  Lungs --good air movement, no wheezing and crackles. No respiratory distress Heart-- normal rate, regular rhythm, no murmur, and no gallop.  Extremities-- no pretibial edema bilaterally  Neurologic-- alert & oriented X3 and strength normal in all extremities.  Psych-- Cognition and judgment appear intact. Alert and cooperative with normal attention span and concentration. not anxious appearing and not depressed appearing.      Assessment & Plan:

## 2012-02-02 NOTE — Assessment & Plan Note (Addendum)
Since last office visit, we decrease beta blockers and add losartan, chest x-ray was negative. Dyspnea on exertion and episodes of weakness  improved. He has an appointment pending with cardiology.  Plan: Continue with the same regimen

## 2012-02-04 ENCOUNTER — Ambulatory Visit (INDEPENDENT_AMBULATORY_CARE_PROVIDER_SITE_OTHER): Payer: Medicare Other | Admitting: Cardiovascular Disease

## 2012-02-04 ENCOUNTER — Encounter: Payer: Self-pay | Admitting: Cardiovascular Disease

## 2012-02-04 VITALS — BP 111/64 | HR 50 | Ht 69.0 in | Wt 173.0 lb

## 2012-02-04 DIAGNOSIS — I4510 Unspecified right bundle-branch block: Secondary | ICD-10-CM

## 2012-02-04 DIAGNOSIS — E1139 Type 2 diabetes mellitus with other diabetic ophthalmic complication: Secondary | ICD-10-CM

## 2012-02-04 DIAGNOSIS — I2510 Atherosclerotic heart disease of native coronary artery without angina pectoris: Secondary | ICD-10-CM

## 2012-02-04 DIAGNOSIS — E11319 Type 2 diabetes mellitus with unspecified diabetic retinopathy without macular edema: Secondary | ICD-10-CM

## 2012-02-04 DIAGNOSIS — H579 Unspecified disorder of eye and adnexa: Secondary | ICD-10-CM

## 2012-02-04 DIAGNOSIS — E785 Hyperlipidemia, unspecified: Secondary | ICD-10-CM

## 2012-02-04 DIAGNOSIS — I7100 Dissection of unspecified site of aorta: Secondary | ICD-10-CM

## 2012-02-04 NOTE — Patient Instructions (Signed)
Your physician recommends that you schedule a follow-up appointment in: 8 WEEKS WITH DR [facility] Your physician has recommended you make the following change in your medication: STOP LOSARTAN ( COZAAR) AND DECREASE  METOPROLOL TO 1/2 TAB EVERY DAY

## 2012-02-04 NOTE — Assessment & Plan Note (Signed)
Cholesterol is at goal.  Continue current dose of statin and diet Rx.  No myalgias or side effects.  F/U  LFT's in 6 months. Lab Results  Component Value Date   LDLCALC 90 12/01/2011             

## 2012-02-04 NOTE — Assessment & Plan Note (Signed)
Stable F/U US in a year

## 2012-02-04 NOTE — Assessment & Plan Note (Signed)
Discussed low carb diet.  Target hemoglobin A1c is 6.5 or less.  Continue current medications.  

## 2012-02-04 NOTE — Assessment & Plan Note (Signed)
Episodes of weakness with brady and systolics under 100  Stop cozaar and decrease lopresser to daily  F/U 8 weeks

## 2012-02-04 NOTE — Progress Notes (Signed)
Patient ID: Phillip Jordan, male   DOB: 05/02/1930, 78 y.o.   MRN: [id number] [name] seen today in followup for his coronary artery disease, AAA with distal dissection, and some mild shortness of breath. He also has a chronic right bundle branch block. He has not any significant chest pain. His last Myoview was Dec 29, 2007 there is a fixed defect in the inferior wall suggesting possible previous infarction with no really significant ischemia. His EF was 69%. He's had previous stents to the RCA and circumflex. His last catheter was in July of 2005. At that time he received 2 stents to the RCA. The circumflex coronary artery stent was done prior to 2005. He is active with his grandchildren and silver sneakers at the [facility]. [name] He has no angiina PND orthopnea no palpitations or syncope.   Reviewed Abdominal US from 2/12 and Ao only 2.3 by 2.5 cm and distal disection not visible with no false lumen  Has had recurrent weak episodes since back on cozaar and BP/HR low at [facility].  Has had bradycardia in past and elevated K  ROS: Denies fever, malais, weight loss, blurry vision, decreased visual acuity, cough, sputum, SOB, hemoptysis, pleuritic pain, palpitaitons, heartburn, abdominal pain, melena, lower extremity edema, claudication, or rash.  All other systems reviewed and negative  General: Affect appropriate Healthy:  appears stated age HEENT: normal Neck supple with no adenopathy JVP normal no bruits no thyromegaly Lungs clear with no wheezing and good diaphragmatic motion Heart:  S1/S2 no murmur, no rub, gallop or click PMI normal Abdomen: benighn, BS positve, no tenderness, no AAA no bruit.  No HSM or HJR Distal pulses intact with no bruits No edema Neuro non-focal Skin warm and dry No muscular weakness   Current Outpatient Prescriptions  Medication Sig Dispense Refill  . albuterol (VENTOLIN HFA) 108 (90 BASE) MCG/ACT inhaler Inhale 2 puffs into the lungs every 6 (six) hours as needed.  18 g  3   . aspirin 325 MG tablet Take 325 mg by mouth daily.        . clopidogrel (PLAVIX) 75 MG tablet Take 1 tablet (75 mg total) by mouth daily.  90 tablet  3  . fish oil-omega-3 fatty acids 1000 MG capsule Take 2 g by mouth daily.        [name] guaiFENesin-codeine (ROBITUSSIN AC) 100-10 MG/5ML syrup Take 10 mLs by mouth 3 (three) times daily as needed for cough.  240 mL  0  . isosorbide mononitrate (IMDUR) 60 MG 24 hr tablet Take 1 tablet (60 mg total) by mouth daily.  90 tablet  3  . loratadine (CLARITIN) 10 MG tablet Take 10 mg by mouth daily.      [name] losartan (COZAAR) 25 MG tablet Take 1 tablet (25 mg total) by mouth daily.  30 tablet  3  . metFORMIN (GLUCOPHAGE) 500 MG tablet Take 1 tablet (500 mg total) by mouth daily with breakfast.  90 tablet  3  . metoprolol tartrate (LOPRESSOR) 25 MG tablet Take 12.5 mg by mouth 2 (two) times daily.      . mometasone-formoterol (DULERA) 100-5 MCG/ACT AERO Inhale 2 puffs into the lungs 2 (two) times daily.  1 Inhaler  3  . montelukast (SINGULAIR) 10 MG tablet Take 1 tablet (10 mg total) by mouth at bedtime.  30 tablet  0  . nitroGLYCERIN (NITROSTAT) 0.4 MG SL tablet Place 0.4 mg under the tongue every 5 (five) minutes as needed.        [name]  pravastatin (PRAVACHOL) 40 MG tablet Take 1 tablet (40 mg total) by mouth daily.  30 tablet  6    Allergies  Review of patient's allergies indicates no known allergies.  Electrocardiogram: 01/05/12  NSR rate 44 RBBB no acute changes  Assessment and Plan

## 2012-02-04 NOTE — Assessment & Plan Note (Signed)
Stable with no angina and good activity level.  Continue medical Rx  

## 2012-03-29 ENCOUNTER — Ambulatory Visit: Payer: Medicare Other | Admitting: Cardiovascular Disease

## 2012-04-15 ENCOUNTER — Ambulatory Visit (INDEPENDENT_AMBULATORY_CARE_PROVIDER_SITE_OTHER): Payer: Medicare Other | Admitting: Cardiovascular Disease

## 2012-04-15 ENCOUNTER — Encounter: Payer: Self-pay | Admitting: Cardiovascular Disease

## 2012-04-15 VITALS — BP 125/70 | HR 63 | Ht 69.0 in | Wt 176.0 lb

## 2012-04-15 DIAGNOSIS — I2510 Atherosclerotic heart disease of native coronary artery without angina pectoris: Secondary | ICD-10-CM

## 2012-04-15 DIAGNOSIS — E785 Hyperlipidemia, unspecified: Secondary | ICD-10-CM

## 2012-04-15 DIAGNOSIS — H579 Unspecified disorder of eye and adnexa: Secondary | ICD-10-CM

## 2012-04-15 DIAGNOSIS — I7100 Dissection of unspecified site of aorta: Secondary | ICD-10-CM

## 2012-04-15 DIAGNOSIS — E11319 Type 2 diabetes mellitus with unspecified diabetic retinopathy without macular edema: Secondary | ICD-10-CM

## 2012-04-15 DIAGNOSIS — E1139 Type 2 diabetes mellitus with other diabetic ophthalmic complication: Secondary | ICD-10-CM

## 2012-04-15 NOTE — Patient Instructions (Addendum)
Your physician wants you to follow-up in: 6 months with Dr. Nishan. You will receive a reminder letter in the mail two months in advance. If you don't receive a letter, please call our office to schedule the follow-up appointment.  Your physician recommends that you continue on your current medications as directed. Please refer to the Current Medication list given to you today.  

## 2012-04-15 NOTE — Assessment & Plan Note (Signed)
Discussed low carb diet.  Target hemoglobin A1c is 6.5 or less.  Continue current medications.  

## 2012-04-15 NOTE — Assessment & Plan Note (Signed)
No abdominal pain and stable over multiple US exams

## 2012-04-15 NOTE — Assessment & Plan Note (Signed)
Cholesterol is at goal.  Continue current dose of statin and diet Rx.  No myalgias or side effects.  F/U  LFT's in 6 months. Lab Results  Component Value Date   LDLCALC 90 12/01/2011

## 2012-04-15 NOTE — Progress Notes (Signed)
Patient ID: Phillip Jordan, male   DOB: [birth year], 78 y.o.   MRN: [id number] Ibrohim seen today in followup for his coronary artery disease, AAA with distal dissection, and some mild shortness of breath. He also has a chronic right bundle branch block. He has not any significant chest pain. His last Myoview was Dec 29, 2007 there is a fixed defect in the inferior wall suggesting possible previous infarction with no really significant ischemia. His EF was 69%. He's had previous stents to the RCA and circumflex. His last catheter was in July of 2005. At that time he received 2 stents to the RCA. The circumflex coronary artery stent was done prior to 2005. He is active with his grandchildren and silver sneakers at the [facility]. [name] He has no angiina PND orthopnea no palpitations or syncope.  Reviewed Abdominal US from 2/12 and Ao only 2.3 by 2.5 cm and distal disection not visible with no false lumen  Has had recurrent weak episodes since back on cozaar and BP/HR low at [facility]. Has had bradycardia in past and elevated K  Feels better off cozaar and beta blocker with better hemodynamics     ROS: Denies fever, malais, weight loss, blurry vision, decreased visual acuity, cough, sputum, SOB, hemoptysis, pleuritic pain, palpitaitons, heartburn, abdominal pain, melena, lower extremity edema, claudication, or rash.  All other systems reviewed and negative  General: Affect appropriate Healthy:  appears stated age HEENT: normal Neck supple with no adenopathy JVP normal no bruits no thyromegaly Lungs clear with no wheezing and good diaphragmatic motion Heart:  S1/S2 no murmur, no rub, gallop or click PMI normal Abdomen: benighn, BS positve, no tenderness, no AAA no bruit.  No HSM or HJR Distal pulses intact with no bruits No edema bilateral varicosities Neuro non-focal Skin warm and dry No muscular weakness   Current Outpatient Prescriptions  Medication Sig Dispense Refill  . albuterol (VENTOLIN HFA) 108  (90 BASE) MCG/ACT inhaler Inhale 2 puffs into the lungs every 6 (six) hours as needed.  18 g  3  . aspirin 325 MG tablet Take 325 mg by mouth daily.        . clopidogrel (PLAVIX) 75 MG tablet Take 1 tablet (75 mg total) by mouth daily.  90 tablet  3  . fish oil-omega-3 fatty acids 1000 MG capsule Take 2 g by mouth daily.        [name] guaiFENesin-codeine (ROBITUSSIN AC) 100-10 MG/5ML syrup Take 10 mLs by mouth 3 (three) times daily as needed for cough.  240 mL  0  . isosorbide mononitrate (IMDUR) 60 MG 24 hr tablet Take 1 tablet (60 mg total) by mouth daily.  90 tablet  3  . loratadine (CLARITIN) 10 MG tablet Take 10 mg by mouth daily.      . metFORMIN (GLUCOPHAGE) 500 MG tablet Take 1 tablet (500 mg total) by mouth daily with breakfast.  90 tablet  3  . metoprolol tartrate (LOPRESSOR) 25 MG tablet Take 12.5 mg by mouth daily.       . mometasone-formoterol (DULERA) 100-5 MCG/ACT AERO Inhale 2 puffs into the lungs 2 (two) times daily.  1 Inhaler  3  . montelukast (SINGULAIR) 10 MG tablet Take 1 tablet (10 mg total) by mouth at bedtime.  30 tablet  0  . nitroGLYCERIN (NITROSTAT) 0.4 MG SL tablet Place 0.4 mg under the tongue every 5 (five) minutes as needed.        . pravastatin (PRAVACHOL) 40 MG tablet Take 1 tablet (  40 mg total) by mouth daily.  30 tablet  6    Allergies  Review of patient's allergies indicates no known allergies.  Electrocardiogram:  Assessment and Plan

## 2012-04-15 NOTE — Assessment & Plan Note (Signed)
Stable with no angina and good activity level.  Continue medical Rx  

## 2012-05-25 ENCOUNTER — Other Ambulatory Visit: Payer: Self-pay | Admitting: Internal Medicine

## 2012-05-25 MED ORDER — MONTELUKAST SODIUM 10 MG PO TABS: 10.0000 mg | ORAL_TABLET | Freq: Every day | ORAL | Status: DC

## 2012-06-01 ENCOUNTER — Encounter: Payer: Self-pay | Admitting: Internal Medicine

## 2012-06-01 ENCOUNTER — Ambulatory Visit (INDEPENDENT_AMBULATORY_CARE_PROVIDER_SITE_OTHER): Payer: Medicare Other | Admitting: Internal Medicine

## 2012-06-01 VITALS — BP 132/72 | HR 64 | Temp 97.9°F | Ht 68.25 in | Wt 177.0 lb

## 2012-06-01 DIAGNOSIS — Z0000 Encounter for general adult medical examination without abnormal findings: Secondary | ICD-10-CM

## 2012-06-01 DIAGNOSIS — J45909 Unspecified asthma, uncomplicated: Secondary | ICD-10-CM

## 2012-06-01 DIAGNOSIS — Z23 Encounter for immunization: Secondary | ICD-10-CM

## 2012-06-01 DIAGNOSIS — I2510 Atherosclerotic heart disease of native coronary artery without angina pectoris: Secondary | ICD-10-CM

## 2012-06-01 DIAGNOSIS — E1139 Type 2 diabetes mellitus with other diabetic ophthalmic complication: Secondary | ICD-10-CM

## 2012-06-01 DIAGNOSIS — E785 Hyperlipidemia, unspecified: Secondary | ICD-10-CM

## 2012-06-01 DIAGNOSIS — E11319 Type 2 diabetes mellitus with unspecified diabetic retinopathy without macular edema: Secondary | ICD-10-CM

## 2012-06-01 DIAGNOSIS — Z79899 Other long term (current) drug therapy: Secondary | ICD-10-CM

## 2012-06-01 LAB — AST: AST: 22 U/L (ref 0–37)

## 2012-06-01 LAB — HEMOGLOBIN A1C: Hgb A1c MFr Bld: 6.5 % (ref 4.6–6.5)

## 2012-06-01 LAB — ALT: ALT: 22 U/L (ref 0–53)

## 2012-06-01 MED ORDER — MONTELUKAST SODIUM 10 MG PO TABS: 10.0000 mg | ORAL_TABLET | Freq: Every day | ORAL | Status: DC

## 2012-06-01 NOTE — Assessment & Plan Note (Signed)
Well-controlled, check LFTs are

## 2012-06-01 NOTE — Assessment & Plan Note (Addendum)
Seems to be doing well, sees the eye doctor, check labs. Cardiology discontinue ARB's likely  d/t  potassium increase Feet exam + for neuropathy, feet care discussed

## 2012-06-01 NOTE — Assessment & Plan Note (Signed)
Asthma COPD well controlled, flu shot and pneumonia shot today. Refill Singulair

## 2012-06-01 NOTE — Assessment & Plan Note (Signed)
chart reviewed  Td 2008, pneumonia shot 2008 and today 05-2012 , had shingle shot already, flu shot--today   Cscope 2007, TICS, no polyps (Dr Jarold Motto). Next Cscope...per GI  prostate cancer screening-- we agreed to stop screenings, see previous notes In the past, he has a slightly low testosterone,   bone density test was normal (06-2011) Continue with his healthy lifestyle

## 2012-06-01 NOTE — Progress Notes (Signed)
  Subjective:    Patient ID: Phillip Jordan, male    DOB: Jul 23, 1930, 78 y.o.   MRN: [id number]  HPI Here for Medicare AWV: 1.Risk factors based on Past M, S, F history: yes   2. Physical Activities: [employer] 3 times a week, active   3. Depression/mood: denies, no problems noted   4. Hearing: poor hearing , has hearing aids, sees audiology @ the [place] 5. ADL's: totally independent , still drives   6. Fall Risk:  Prevention discussed  7. Home Safety: feels safe at home   8. Height, weight, &visual acuity:see VS, vision corrected, sees eye doctor yearly   9. Counseling: yes , see below   10.Labs ordered based on risk factors: yes   11.Referral Coordination: if needed   12.Care Plan: see A/P 13.   Cognitive Assessment : cognition, memory, motor skills seem appropriate.  in addition, we discussed the following issues Diabetes, on metformin, CBGs ranged from 98 to 130 High cholesterol, good medication compliance, no apparent side effects asthma, COPD, and good compliance with medications, uses ventolin on average once a week, usually before exercise or singing  Past Medical History: DM II Hyperlipidemia COPD--asthma Coronary artery disease,s/p MI 1992 Allergic Rhinitis Hx of AORTIC DISSECTION--f/u by cards  Past Surgical History: Back surgery, 1993 Cataract B ~ 2013  Family History: prostate ca--no colon ca--no Asthma: mother Heart disease: mother (CHF) father (M.I.) Cancer: mother (melanoma)  Social History: Married  ,3 kids, 8 gk   Patient states former smoker.  (quit 1994) pt is retired from [employer]. Does local  mission work. Alcohol use-no Drug use-no Diet-- healthy most of the time   Review of Systems No chest pain, lower extremity edema or palpitations No nausea, vomiting, diarrhea or blood in the stools No dysuria or gross hematuria.     Objective:   Physical Exam General -- alert, well-developed, and well-nourished.   Neck --no thyromegaly ,  normal carotid pulse Lungs -- normal respiratory effort, no intercostal retractions, no accessory muscle use, and normal breath sounds.   Heart-- normal rate, regular rhythm, no murmur, and no gallop.   Abdomen--soft, non-tender, no distention, no masses, no HSM, no guarding, and no rigidity. No bruit  DIABETIC FEET EXAM: No lower extremity edema Normal pedal pulses bilaterally Skin and nails are normal without calluses Pinprick examination -- mild neuropathy Neurologic-- alert & oriented X3 and strength normal in all extremities. Psych-- Cognition and judgment appear intact. Alert and cooperative with normal attention span and concentration.  not anxious appearing and not depressed appearing.      Assessment & Plan:

## 2012-06-01 NOTE — Assessment & Plan Note (Signed)
Note from cardiology reviewed, he is a stable

## 2012-10-03 ENCOUNTER — Encounter: Payer: Self-pay | Admitting: Internal Medicine

## 2012-10-03 MED ORDER — METOPROLOL TARTRATE 25 MG PO TABS: 12.5000 mg | ORAL_TABLET | Freq: Every day | ORAL | Status: DC

## 2012-10-03 MED ORDER — ALBUTEROL SULFATE HFA 108 (90 BASE) MCG/ACT IN AERS: 2.0000 | INHALATION_SPRAY | Freq: Four times a day (QID) | RESPIRATORY_TRACT | Status: DC | PRN

## 2012-10-03 MED ORDER — METFORMIN HCL 500 MG PO TABS: 500.0000 mg | ORAL_TABLET | Freq: Every day | ORAL | Status: DC

## 2012-10-03 MED ORDER — PRAVASTATIN SODIUM 40 MG PO TABS: 40.0000 mg | ORAL_TABLET | Freq: Every day | ORAL | Status: DC

## 2012-10-03 MED ORDER — ISOSORBIDE MONONITRATE ER 60 MG PO TB24: 60.0000 mg | ORAL_TABLET | Freq: Every day | ORAL | Status: DC

## 2012-10-03 MED ORDER — CLOPIDOGREL BISULFATE 75 MG PO TABS: 75.0000 mg | ORAL_TABLET | Freq: Every day | ORAL | Status: DC

## 2012-10-03 NOTE — Telephone Encounter (Signed)
Refill done.  

## 2012-10-19 ENCOUNTER — Ambulatory Visit (INDEPENDENT_AMBULATORY_CARE_PROVIDER_SITE_OTHER): Payer: Medicare Other | Admitting: Internal Medicine

## 2012-10-19 ENCOUNTER — Encounter: Payer: Self-pay | Admitting: Internal Medicine

## 2012-10-19 VITALS — BP 126/74 | HR 55 | Temp 98.1°F | Wt 179.0 lb

## 2012-10-19 DIAGNOSIS — H6120 Impacted cerumen, unspecified ear: Secondary | ICD-10-CM

## 2012-10-19 DIAGNOSIS — H6121 Impacted cerumen, right ear: Secondary | ICD-10-CM

## 2012-10-19 NOTE — Progress Notes (Signed)
  Subjective:    Patient ID: Phillip Jordan, male    DOB: 07-Feb-1930, 79 y.o.   MRN: [id number]  HPI Acute visit Can't hear well, suspect he has wax buildup.  Past Medical History  Diagnosis Date  . COPD with asthma   . CAD (coronary artery disease)     s/p MI 43  . Diabetes mellitus   . Hyperlipidemia   . Allergic rhinitis   . Aortic dissection     f/u by cards   Past Surgical History  Procedure Laterality Date  . Back surgery  1993  . Cardiac catheterization      left, w/ coronary angiography and left ventriculograpy  . Ptca      w/ placement of drugeluting stent in the distal right coronary artery, and distal Cardiologist: Loraine Leriche W.Pulsipher     Review of Systems Denies any ear pain, ear discharge. Otherwise feeling well    Objective:   Physical Exam General -- alert, well-developed HEENT --  Wax B, no discharge Neurologic-- alert & oriented X3 and strength normal in all extremities. Psych-- Cognition and judgment appear intact. Alert and cooperative with normal attention span and concentration.  not anxious appearing and not depressed appearing.       Assessment & Plan:  Cerumen impaction, Removed wax from the right ear with an alligator. Flush the left. Recommend peroxide 3 times a week to prevent wax buildup

## 2012-11-15 ENCOUNTER — Encounter (INDEPENDENT_AMBULATORY_CARE_PROVIDER_SITE_OTHER): Payer: Medicare Other

## 2012-11-15 DIAGNOSIS — I714 Abdominal aortic aneurysm, without rupture: Secondary | ICD-10-CM

## 2012-11-25 ENCOUNTER — Telehealth: Payer: Self-pay | Admitting: Cardiovascular Disease

## 2012-11-25 NOTE — Telephone Encounter (Signed)
New Problem:    Patient's wife called in returning your call about his Abdominal Ultrasound results.  Please call back.

## 2012-11-25 NOTE — Telephone Encounter (Signed)
LMTCB ./CY 

## 2012-11-28 NOTE — Telephone Encounter (Signed)
Results given, verbalized understanding.

## 2012-11-28 NOTE — Telephone Encounter (Signed)
Follow-up:    Patient's wife called in returning Christine's call.  Please call back.

## 2012-11-30 ENCOUNTER — Ambulatory Visit: Payer: Medicare Other | Admitting: Internal Medicine

## 2012-12-13 ENCOUNTER — Ambulatory Visit (INDEPENDENT_AMBULATORY_CARE_PROVIDER_SITE_OTHER): Payer: Medicare Other | Admitting: Internal Medicine

## 2012-12-13 ENCOUNTER — Encounter: Payer: Self-pay | Admitting: Internal Medicine

## 2012-12-13 VITALS — BP 134/80 | HR 61 | Wt 175.0 lb

## 2012-12-13 DIAGNOSIS — R5383 Other fatigue: Secondary | ICD-10-CM | POA: Insufficient documentation

## 2012-12-13 DIAGNOSIS — R490 Dysphonia: Secondary | ICD-10-CM

## 2012-12-13 DIAGNOSIS — J449 Chronic obstructive pulmonary disease, unspecified: Secondary | ICD-10-CM

## 2012-12-13 DIAGNOSIS — E785 Hyperlipidemia, unspecified: Secondary | ICD-10-CM

## 2012-12-13 DIAGNOSIS — I2510 Atherosclerotic heart disease of native coronary artery without angina pectoris: Secondary | ICD-10-CM

## 2012-12-13 DIAGNOSIS — E1139 Type 2 diabetes mellitus with other diabetic ophthalmic complication: Secondary | ICD-10-CM

## 2012-12-13 DIAGNOSIS — E11319 Type 2 diabetes mellitus with unspecified diabetic retinopathy without macular edema: Secondary | ICD-10-CM

## 2012-12-13 DIAGNOSIS — R5381 Other malaise: Secondary | ICD-10-CM

## 2012-12-13 NOTE — Assessment & Plan Note (Addendum)
Able to walk 1 mile without getting excessively short of breath. Good medication compliance. Some wheezing on today's exam but denies cough. Last PFTs 06-2011. Plan: No change, reassess on  return to the office.

## 2012-12-13 NOTE — Assessment & Plan Note (Addendum)
Due for labs. Continue with same medications.

## 2012-12-13 NOTE — Progress Notes (Signed)
  Subjective:    Patient ID: Phillip Jordan, male    DOB: [birth year], 79 y.o.   MRN: [id number]  HPI ROV CAD, good medication compliance, has nitroglycerin but has not needed. History of COPD, asthma--- good medication compliance with medications, he uses albuterol preexercise and prn wheezing. Able to do all his activities of daily living and walk 1 mile without problems. Diabetes, good compliance with meds, blood sugars range from 96 to 115. No ambulatory BPs. High cholesterol, good medication compliance.  Past Medical History  Diagnosis Date  . COPD with asthma   . CAD (coronary artery disease)     s/p MI 12  . Diabetes mellitus   . Hyperlipidemia   . Allergic rhinitis   . Aortic dissection     f/u by cards   Past Surgical History  Procedure Laterality Date  . Back surgery  1993  . Cardiac catheterization      left, w/ coronary angiography and left ventriculograpy  . Ptca      w/ placement of drugeluting stent in the distal right coronary artery, and distal Cardiologist: Loraine Leriche W.Pulsipher    Review of Systems Several years history of fatigue, not getting worse. When asked, admits to some snoring, occasionally feels sleepy throughout the day. No chest pain, shortness or breath, lower extremity edema, orthopnea. No nausea, vomiting, blood in the stools. No actual sputum production or much cough.     Objective:   Physical Exam BP 134/80  Pulse 61  Wt 175 lb (79.379 kg)  BMI 26.4 kg/m2  SpO2 95%  General -- alert, well-developed, No apparent distress.    Lungs -- No increased work of breathing, decreased breath sounds throughout, some end expiratory wheezing bilaterally. No crackles or rhonchi.  Heart-- normal rate, regular rhythm, no murmur, and no gallop.   Extremities-- no pretibial edema bilaterally  Neurologic-- alert & oriented X3 and strength normal in all extremities. Psych-- Cognition and judgment appear intact. Alert and cooperative with normal attention  span and concentration.  not anxious appearing and not depressed appearing.      Assessment & Plan:

## 2012-12-13 NOTE — Assessment & Plan Note (Signed)
Today, he reports fatigue, stable, not getting worse. Some snoring and  feeling sleepy. Symptoms are most likely multifactorial, is hard to say if there is a sleep apnea component . Plan: Observation for now, consider  screening for sleep apnea

## 2012-12-13 NOTE — Assessment & Plan Note (Signed)
Controlling cardiovascular risk factors, asymptomatic.

## 2012-12-13 NOTE — Patient Instructions (Signed)
Please come back fasting for labs at some point this week: CMP --- dx Hypertension A1c --- dx diabetes FLP --- dx high cholesterol

## 2012-12-13 NOTE — Assessment & Plan Note (Signed)
Good compliance with medications  Need labs

## 2012-12-13 NOTE — Assessment & Plan Note (Signed)
Had ENT Evaluation----> See report

## 2012-12-15 ENCOUNTER — Other Ambulatory Visit: Payer: Medicare Other

## 2012-12-16 ENCOUNTER — Other Ambulatory Visit (INDEPENDENT_AMBULATORY_CARE_PROVIDER_SITE_OTHER): Payer: Medicare Other

## 2012-12-16 DIAGNOSIS — I10 Essential (primary) hypertension: Secondary | ICD-10-CM

## 2012-12-16 DIAGNOSIS — E119 Type 2 diabetes mellitus without complications: Secondary | ICD-10-CM

## 2012-12-16 DIAGNOSIS — E7800 Pure hypercholesterolemia, unspecified: Secondary | ICD-10-CM

## 2012-12-16 LAB — HEMOGLOBIN A1C: Hgb A1c MFr Bld: 6.7 % — ABNORMAL HIGH (ref 4.6–6.5)

## 2012-12-16 LAB — COMPREHENSIVE METABOLIC PANEL
Alkaline Phosphatase: 84 U/L (ref 39–117)
BUN: 14 mg/dL (ref 6–23)
CO2: 31 mEq/L (ref 19–32)
Creatinine, Ser: 0.9 mg/dL (ref 0.4–1.5)
GFR: 88.43 mL/min (ref >60.00)
Glucose, Bld: 124 mg/dL — ABNORMAL HIGH (ref 70–99)
Sodium: 135 mEq/L (ref 135–145)
Total Bilirubin: 0.5 mg/dL (ref 0.3–1.2)

## 2012-12-16 LAB — LIPID PANEL
Cholesterol: 158 mg/dL (ref 0–200)
HDL: 35.1 mg/dL — ABNORMAL LOW (ref >39.00)
Triglycerides: 139 mg/dL (ref 0.0–149.0)
VLDL: 27.8 mg/dL (ref 0.0–40.0)

## 2012-12-16 NOTE — Progress Notes (Signed)
LABS ONLY  

## 2012-12-28 ENCOUNTER — Telehealth: Payer: Self-pay | Admitting: *Deleted

## 2012-12-28 ENCOUNTER — Encounter: Payer: Self-pay | Admitting: Internal Medicine

## 2012-12-28 MED ORDER — MONTELUKAST SODIUM 10 MG PO TABS: 10.0000 mg | ORAL_TABLET | Freq: Every day | ORAL | Status: DC

## 2012-12-28 NOTE — Telephone Encounter (Signed)
Refill done.  

## 2013-01-22 LAB — HM DIABETES EYE EXAM

## 2013-02-01 ENCOUNTER — Encounter: Payer: Self-pay | Admitting: Cardiovascular Disease

## 2013-02-10 ENCOUNTER — Encounter: Payer: Self-pay | Admitting: Internal Medicine

## 2013-02-10 MED ORDER — CLOPIDOGREL BISULFATE 75 MG PO TABS: 75.0000 mg | ORAL_TABLET | Freq: Every day | ORAL | Status: DC

## 2013-02-10 NOTE — Telephone Encounter (Signed)
Refill done.  

## 2013-03-30 ENCOUNTER — Telehealth: Payer: Self-pay | Admitting: *Deleted

## 2013-03-30 ENCOUNTER — Encounter: Payer: Self-pay | Admitting: Internal Medicine

## 2013-03-30 MED ORDER — METFORMIN HCL 500 MG PO TABS: 500.0000 mg | ORAL_TABLET | Freq: Every day | ORAL | Status: DC

## 2013-03-30 MED ORDER — ISOSORBIDE MONONITRATE ER 60 MG PO TB24: 60.0000 mg | ORAL_TABLET | Freq: Every day | ORAL | Status: DC

## 2013-03-30 NOTE — Telephone Encounter (Signed)
Refill for metformin and isosorbide done per pt. Request and per protocol. See mychart message

## 2013-04-13 ENCOUNTER — Encounter: Payer: Self-pay | Admitting: Cardiovascular Disease

## 2013-04-13 ENCOUNTER — Ambulatory Visit (INDEPENDENT_AMBULATORY_CARE_PROVIDER_SITE_OTHER): Payer: Medicare Other | Admitting: Cardiovascular Disease

## 2013-04-13 VITALS — BP 137/65 | HR 72 | Wt 174.0 lb

## 2013-04-13 DIAGNOSIS — E1139 Type 2 diabetes mellitus with other diabetic ophthalmic complication: Secondary | ICD-10-CM

## 2013-04-13 DIAGNOSIS — I7100 Dissection of unspecified site of aorta: Secondary | ICD-10-CM

## 2013-04-13 DIAGNOSIS — E11319 Type 2 diabetes mellitus with unspecified diabetic retinopathy without macular edema: Secondary | ICD-10-CM

## 2013-04-13 DIAGNOSIS — I2510 Atherosclerotic heart disease of native coronary artery without angina pectoris: Secondary | ICD-10-CM

## 2013-04-13 DIAGNOSIS — E785 Hyperlipidemia, unspecified: Secondary | ICD-10-CM

## 2013-04-13 DIAGNOSIS — J45909 Unspecified asthma, uncomplicated: Secondary | ICD-10-CM

## 2013-04-13 DIAGNOSIS — I4510 Unspecified right bundle-branch block: Secondary | ICD-10-CM

## 2013-04-13 NOTE — Assessment & Plan Note (Signed)
Chronic Stable BP ok  F/U VVS no imaging needed at this time

## 2013-04-13 NOTE — Assessment & Plan Note (Signed)
Cholesterol is at goal.  Continue current dose of statin and diet Rx.  No myalgias or side effects.  F/U  LFT's in 6 months. Lab Results  Component Value Date   LDLCALC 95 12/16/2012             

## 2013-04-13 NOTE — Assessment & Plan Note (Signed)
F/U primary summer weather and humidity bothering him D/C beta blocker

## 2013-04-13 NOTE — Assessment & Plan Note (Signed)
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

## 2013-04-13 NOTE — Assessment & Plan Note (Signed)
Stable with no angina and good activity level.  Continue medical Rx  

## 2013-04-13 NOTE — Assessment & Plan Note (Signed)
Discussed low carb diet.  Target hemoglobin A1c is 6.5 or less.  Continue current medications.  

## 2013-04-13 NOTE — Patient Instructions (Addendum)
**Note De-Identified Phillip Jordan Obfuscation** Your physician has recommended you make the following change in your medication: stop taking Metoprolol  Your physician recommends that you schedule a follow-up appointment in: October

## 2013-04-13 NOTE — Progress Notes (Signed)
Patient ID: Phillip Jordan, male   DOB: 30-Aug-1929, 79 y.o.   MRN: [id number] Phillip Jordan seen today in followup for his coronary artery disease, AAA with distal dissection, and some mild shortness of breath. He also has a chronic right bundle branch block. He has not any significant chest pain. His last Myoview was Dec 29, 2007 there is a fixed defect in the inferior wall suggesting possible previous infarction with no really significant ischemia. His EF was 69%. He's had previous stents to the RCA and circumflex. His last catheter was in July of 2005. At that time he received 2 stents to the RCA. The circumflex coronary artery stent was done prior to 2005. He is active with his grandchildren and silver sneakers at the [facility]. [name] He has no angiina PND orthopnea no palpitations or syncope.  Reviewed Abdominal US from 2/12 and Ao only 2.3 by 2.5 cm and distal disection not visible with no false lumen  Has had recurrent weak episodes since back on cozaar and BP/HR low at [facility]. Has had bradycardia in past and elevated K     Feels better off cozaar Still taking some metoprolol  Discussed stopping this    ROS: Denies fever, malais, weight loss, blurry vision, decreased visual acuity, cough, sputum, SOB, hemoptysis, pleuritic pain, palpitaitons, heartburn, abdominal pain, melena, lower extremity edema, claudication, or rash.  All other systems reviewed and negative  General: Affect appropriate Healthy:  appears stated age HEENT: normal Neck supple with no adenopathy JVP normal no bruits no thyromegaly Lungs expitory wheezing and good diaphragmatic motion Heart:  S1/S2 SEM  murmur, no rub, gallop or click PMI normal Abdomen: benighn, BS positve, no tenderness, no AAA no bruit.  No HSM or HJR Distal pulses intact with no bruits No edema Neuro non-focal Skin warm and dry No muscular weakness   Current Outpatient Prescriptions  Medication Sig Dispense Refill  . albuterol (VENTOLIN HFA) 108 (90 BASE)  MCG/ACT inhaler Inhale 2 puffs into the lungs every 6 (six) hours as needed.  18 g  3  . aspirin 325 MG tablet Take 325 mg by mouth daily.        . clopidogrel (PLAVIX) 75 MG tablet Take 1 tablet (75 mg total) by mouth daily.  90 tablet  1  . fish oil-omega-3 fatty acids 1000 MG capsule Take 2 g by mouth daily.        . isosorbide mononitrate (IMDUR) 60 MG 24 hr tablet Take 1 tablet (60 mg total) by mouth daily.  90 tablet  1  . loratadine (CLARITIN) 10 MG tablet Take 10 mg by mouth daily.      . metFORMIN (GLUCOPHAGE) 500 MG tablet Take 1 tablet (500 mg total) by mouth daily with breakfast.  90 tablet  1  . metoprolol tartrate (LOPRESSOR) 25 MG tablet Take 0.5 tablets (12.5 mg total) by mouth daily.  45 tablet  1  . mometasone-formoterol (DULERA) 100-5 MCG/ACT AERO Inhale 2 puffs into the lungs 2 (two) times daily.  1 Inhaler  3  . montelukast (SINGULAIR) 10 MG tablet Take 1 tablet (10 mg total) by mouth at bedtime.  90 tablet  1  . nitroGLYCERIN (NITROSTAT) 0.4 MG SL tablet Place 0.4 mg under the tongue every 5 (five) minutes as needed.        . pravastatin (PRAVACHOL) 40 MG tablet Take 1 tablet (40 mg total) by mouth daily.  90 tablet  1   No current facility-administered medications for this visit.  Allergies  Review of patient's allergies indicates no known allergies.  Electrocardiogram: 02/05/12  SR rate 47 PR 252 RBBB  Today off beta blockers SR rate 58  PR 216  RBBB   Assessment and Plan

## 2013-04-14 ENCOUNTER — Ambulatory Visit (INDEPENDENT_AMBULATORY_CARE_PROVIDER_SITE_OTHER): Payer: Medicare Other | Admitting: Internal Medicine

## 2013-04-14 ENCOUNTER — Encounter: Payer: Self-pay | Admitting: Internal Medicine

## 2013-04-14 ENCOUNTER — Telehealth: Payer: Self-pay | Admitting: *Deleted

## 2013-04-14 ENCOUNTER — Ambulatory Visit (INDEPENDENT_AMBULATORY_CARE_PROVIDER_SITE_OTHER)
Admission: RE | Admit: 2013-04-14 | Discharge: 2013-04-14 | Disposition: A | Discharge status: Home / Self Care | Payer: Medicare Other | Source: Ambulatory Visit | Attending: Internal Medicine | Admitting: Internal Medicine

## 2013-04-14 ENCOUNTER — Other Ambulatory Visit: Payer: Medicare Other

## 2013-04-14 VITALS — BP 120/70 | HR 75 | Temp 97.9°F | Ht 68.9 in | Wt 173.8 lb

## 2013-04-14 DIAGNOSIS — Z0000 Encounter for general adult medical examination without abnormal findings: Secondary | ICD-10-CM

## 2013-04-14 DIAGNOSIS — R2242 Localized swelling, mass and lump, left lower limb: Secondary | ICD-10-CM

## 2013-04-14 DIAGNOSIS — J449 Chronic obstructive pulmonary disease, unspecified: Secondary | ICD-10-CM

## 2013-04-14 DIAGNOSIS — E119 Type 2 diabetes mellitus without complications: Secondary | ICD-10-CM

## 2013-04-14 DIAGNOSIS — J45909 Unspecified asthma, uncomplicated: Secondary | ICD-10-CM

## 2013-04-14 DIAGNOSIS — R229 Localized swelling, mass and lump, unspecified: Secondary | ICD-10-CM

## 2013-04-14 DIAGNOSIS — J4489 Other specified chronic obstructive pulmonary disease: Secondary | ICD-10-CM

## 2013-04-14 DIAGNOSIS — E11319 Type 2 diabetes mellitus with unspecified diabetic retinopathy without macular edema: Secondary | ICD-10-CM

## 2013-04-14 DIAGNOSIS — E785 Hyperlipidemia, unspecified: Secondary | ICD-10-CM

## 2013-04-14 DIAGNOSIS — E1139 Type 2 diabetes mellitus with other diabetic ophthalmic complication: Secondary | ICD-10-CM

## 2013-04-14 LAB — CBC WITH DIFFERENTIAL/PLATELET
Basophils Absolute: 0.1 10*3/uL (ref 0.0–0.1)
HCT: 46 % (ref 39.0–52.0)
Hemoglobin: 15.8 g/dL (ref 13.0–17.0)
Lymphs Abs: 2.2 10*3/uL (ref 0.7–4.0)
MCV: 89.8 fl (ref 78.0–100.0)
Monocytes Absolute: 1.1 10*3/uL — ABNORMAL HIGH (ref 0.1–1.0)
Monocytes Relative: 9.1 % (ref 3.0–12.0)
Neutro Abs: 8 10*3/uL — ABNORMAL HIGH (ref 1.4–7.7)
Platelets: 304 10*3/uL (ref 150.0–400.0)
RDW: 13.1 % (ref 11.5–14.6)

## 2013-04-14 LAB — HEMOGLOBIN A1C: Hgb A1c MFr Bld: 6.7 % — ABNORMAL HIGH (ref 4.6–6.5)

## 2013-04-14 MED ORDER — DULERA 100-5 MCG/ACT IN AERO: 2.0000 | INHALATION_SPRAY | Freq: Two times a day (BID) | RESPIRATORY_TRACT | Status: DC

## 2013-04-14 NOTE — Telephone Encounter (Signed)
CBC with Diff and Hemoglobin A1 c and Microalbumin/Creatin  Needs orders to state future.  Ag cma

## 2013-04-14 NOTE — Progress Notes (Signed)
  Subjective:    Patient ID: Phillip Jordan, male    DOB: 1929/11/16, 79 y.o.   MRN: [id number]  HPI Routine office visit 2 months ago noted a knot in the left pretibial area, since then it has decreased in size, not tender, no history of injury Cardiovascular -- saw cardiology yesterday, they discontinued Lopressor d/t RBBB/EKG findings Diabetes, good medication compliance, ambulatory blood sugar ranged from 96, 120. No symptoms of low blood sugar. High cholesterol good medication compliance.  Past Medical History  Diagnosis Date  . COPD with asthma   . CAD (coronary artery disease)     s/p MI 38  . Diabetes mellitus   . Hyperlipidemia   . Allergic rhinitis   . Aortic dissection     f/u by cards   Past Surgical History  Procedure Laterality Date  . Back surgery  1993  . Cardiac catheterization      left, w/ coronary angiography and left ventriculograpy  . Ptca      w/ placement of drugeluting stent in the distal right coronary artery, and distal Cardiologist: Loraine Leriche W.Pulsipher   History   Social History  . Marital Status: Married    Spouse Name: N/A    Number of Children: 3  . Years of Education: N/A   Occupational History  . retired    Social History Main Topics  . Smoking status: Former Smoker -- 1.50 packs/day for 39 years    Types: Cigarettes    Quit date: 08/24/1992  . Smokeless tobacco: Never Used  . Alcohol Use: No  . Drug Use: No  . Sexual Activity: Not on file   Other Topics Concern  . Not on file   Social History Narrative   Does mission work.   3 kids, 8 Gk                Review of Systems When asked, admits to fatigue, when he goes to the [facility], going from the parking lot to the second floor is difficult, and the end he feels really tired. He denies cough, chest pain or claudication with exertion But denies feeling actually sure but "just tired" No nausea, vomiting, diarrhea blood in the stools. No dysuria gross hematuria. No feet  paresthesias.     Objective:   Physical Exam  Musculoskeletal:       Legs:  General -- alert, well-developed, NAD.  Lungs -- normal respiratory effort, no intercostal retractions, no accessory muscle use,+ wheezing B, no ronchi Heart-- normal rate, regular rhythm, no murmur.   Neurologic-- alert & oriented X3. Speech, gait normal.   Psych-- Cognition and judgment appear intact. Alert and cooperative with normal attention span and concentration. not anxious appearing and not depressed appearing.  DIABETIC FEET EXAM: No lower extremity edema Normal pedal pulses bilaterally Skin and nails are normal without calluses Pinprick examination: patchy decreased sensitivity R>L     Assessment & Plan:  L leg mass-- XR

## 2013-04-14 NOTE — Assessment & Plan Note (Addendum)
COPD- asthma not well controlled, last visit with pulmonary 2012. He was supposed to take dulera but is taking Qvar. Beta blockers already discontinue. Plan: dulera bid reassess 4 weeks Ventolin pre exercise

## 2013-04-14 NOTE — Telephone Encounter (Signed)
Updated orders to future, pt at [facility] for labwork.

## 2013-04-14 NOTE — Assessment & Plan Note (Signed)
Well-controlled, no change 

## 2013-04-14 NOTE — Assessment & Plan Note (Signed)
See "COPD"

## 2013-04-14 NOTE — Assessment & Plan Note (Addendum)
Seems to be well-controlled Feet exam today again confirm neuropathy. Feet care  discussed Sees the eye Dr. Regularly, next appointment in 06-2013 Not on ACEi--- check microalb

## 2013-04-14 NOTE — Patient Instructions (Addendum)
Get your blood work before you leave  Next visit in  1 month to see how your breathing is Please make an appointment before you leave the office today (or call few weeks in advance)  --- Please get your x-ray at the other [facility]  office located at: [address] [place], across from [facility].  Please go to the basement, this is a walk-in facility, they are open from 8:30 to 5:30 PM. Phone number [phone].     Diabetes and Foot Care Diabetes may cause you to have a poor blood supply (circulation) to your legs and feet. Because of this, the skin may be thinner, break easier, and heal more slowly. You also may have nerve damage in your legs and feet causing decreased feeling. You may not notice minor injuries to your feet that could lead to serious problems or infections. Taking care of your feet is one of the most important things you can do for yourself.  HOME CARE INSTRUCTIONS  Do not go barefoot. Bare feet are easily injured.  Check your feet daily for blisters, cuts, and redness.  Wash your feet with warm water (not hot) and mild soap. Pat your feet and between your toes until completely dry.  Apply a moisturizing lotion that does not contain alcohol or petroleum jelly to the dry skin on your feet and to dry brittle toenails. Do not put it between your toes.  Trim your toenails straight across. Do not dig under them or around the cuticle.  Do not cut corns or calluses, or try to remove them with medicine.  Wear clean cotton socks or stockings every day. Make sure they are not too tight. Do not wear knee high stockings since they may decrease blood flow to your legs.  Wear leather shoes that fit properly and have enough cushioning. To break in new shoes, wear them just a few hours a day to avoid injuring your feet.  Wear shoes at all times, even in the house.  Do not cross your legs. This may decrease the blood flow to your feet.  If you find a minor scrape, cut, or  break in the skin on your feet, keep it and the skin around it clean and dry. These areas may be cleansed with mild soap and water. Do not use peroxide, alcohol, iodine or Merthiolate.  When you remove an adhesive bandage, be sure not to harm the skin around it.  If you have a wound, look at it several times a day to make sure it is healing.  Do not use heating pads or hot water bottles. Burns can occur. If you have lost feeling in your feet or legs, you may not know it is happening until it is too late.  Report any cuts, sores or bruises to your caregiver. Do not wait! SEEK MEDICAL CARE IF:   You have an injury that is not healing or you notice redness, numbness, burning, or tingling.  Your feet always feel cold.  You have pain or cramps in your legs and feet. SEEK IMMEDIATE MEDICAL CARE IF:   There is increasing redness, swelling, or increasing pain in the wound.  There is a red line that goes up your leg.  Pus is coming from a wound.  You develop an unexplained oral temperature above 102 F (38.9 C), or as your caregiver suggests.  You notice a bad smell coming from an ulcer or wound. MAKE SURE YOU:   Understand these instructions.  Will watch your condition.  Will get help right away if you are not doing well or get worse. Document Released: 08/07/2000 Document Revised: 11/02/2011 Document Reviewed: 02/13/2009 [facility] Patient Information 2014 [place], [place].

## 2013-04-16 ENCOUNTER — Encounter: Payer: Self-pay | Admitting: Internal Medicine

## 2013-05-11 ENCOUNTER — Other Ambulatory Visit: Payer: Self-pay | Admitting: *Deleted

## 2013-05-11 ENCOUNTER — Encounter: Payer: Self-pay | Admitting: Internal Medicine

## 2013-05-11 MED ORDER — PRAVASTATIN SODIUM 40 MG PO TABS: 40.0000 mg | ORAL_TABLET | Freq: Every day | ORAL | Status: DC

## 2013-05-11 NOTE — Telephone Encounter (Signed)
rx refilled pravastatin #90 / 3 rf  by Dr Drue Novel request. Phillip Jordan

## 2013-05-16 ENCOUNTER — Ambulatory Visit (INDEPENDENT_AMBULATORY_CARE_PROVIDER_SITE_OTHER): Payer: Medicare Other | Admitting: Internal Medicine

## 2013-05-16 ENCOUNTER — Encounter: Payer: Self-pay | Admitting: Internal Medicine

## 2013-05-16 VITALS — BP 149/76 | HR 72 | Temp 97.9°F | Wt 174.8 lb

## 2013-05-16 DIAGNOSIS — J449 Chronic obstructive pulmonary disease, unspecified: Secondary | ICD-10-CM

## 2013-05-16 NOTE — Progress Notes (Signed)
  Subjective:    Patient ID: Phillip Jordan, male    DOB: 1929-10-25, 79 y.o.   MRN: [id number]  HPI Followup visit Since last time he was here, he is taking dulera as prescribed, having good days and bad days.  Past Medical History  Diagnosis Date  . COPD with asthma   . CAD (coronary artery disease)     s/p MI 1  . Diabetes mellitus   . Hyperlipidemia   . Allergic rhinitis   . Aortic dissection     f/u by cards   Past Surgical History  Procedure Laterality Date  . Back surgery  1993  . Cardiac catheterization      left, w/ coronary angiography and left ventriculograpy  . Ptca      w/ placement of drugeluting stent in the distal right coronary artery, and distal Cardiologist: Loraine Leriche W.Pulsipher     Review of Systems  No difficulty breathing at rest s but having difficulties going up stairs, decided to take the elevator often times. Occasional wheezing. Dry cough on and off    Objective:   Physical Exam BP 149/76  Pulse 72  Temp(Src) 97.9 F (36.6 C)  Wt 174 lb 12.8 oz (79.289 kg)  BMI 25.89 kg/m2  SpO2 97%  General -- alert, well-developed, NAD.  Lungs -- normal respiratory effort, no intercostal retractions, no accessory muscle use, Scattered wheezing  bilaterally, dry crackles at the right base?[name] Heart-- normal rate, regular rhythm, no murmur.   Psych-- Cognition and judgment appear intact. Cooperative with normal attention span and concentration. No anxious appearing , no depressed appearing.      Assessment & Plan:

## 2013-05-16 NOTE — Patient Instructions (Signed)
  Next visit in  3 months  for a physical exam   Please make an appointment before you leave the office today (or call few weeks in advance)

## 2013-05-16 NOTE — Assessment & Plan Note (Signed)
Patient started dulera, objectively still has some wheezing, subjectively he still has dyspnea on exertion. Plan: Refer to pulmonary

## 2013-05-17 ENCOUNTER — Encounter: Payer: Self-pay | Admitting: Internal Medicine

## 2013-05-25 ENCOUNTER — Encounter: Payer: Self-pay | Admitting: Cardiovascular Disease

## 2013-05-25 ENCOUNTER — Ambulatory Visit (INDEPENDENT_AMBULATORY_CARE_PROVIDER_SITE_OTHER): Payer: Medicare Other | Admitting: Internal Medicine

## 2013-05-25 ENCOUNTER — Ambulatory Visit (INDEPENDENT_AMBULATORY_CARE_PROVIDER_SITE_OTHER): Payer: Medicare Other | Admitting: Cardiovascular Disease

## 2013-05-25 ENCOUNTER — Encounter: Payer: Self-pay | Admitting: Internal Medicine

## 2013-05-25 VITALS — BP 110/60 | HR 66 | Temp 97.6°F | Ht 69.0 in | Wt 175.6 lb

## 2013-05-25 VITALS — BP 130/84 | HR 69 | Ht 69.0 in | Wt 174.4 lb

## 2013-05-25 DIAGNOSIS — J449 Chronic obstructive pulmonary disease, unspecified: Secondary | ICD-10-CM

## 2013-05-25 DIAGNOSIS — E785 Hyperlipidemia, unspecified: Secondary | ICD-10-CM

## 2013-05-25 DIAGNOSIS — I2510 Atherosclerotic heart disease of native coronary artery without angina pectoris: Secondary | ICD-10-CM

## 2013-05-25 DIAGNOSIS — I7100 Dissection of unspecified site of aorta: Secondary | ICD-10-CM

## 2013-05-25 DIAGNOSIS — I4510 Unspecified right bundle-branch block: Secondary | ICD-10-CM

## 2013-05-25 MED ORDER — PANTOPRAZOLE SODIUM 40 MG PO TBEC: 40.0000 mg | DELAYED_RELEASE_TABLET | Freq: Every day | ORAL | Status: DC

## 2013-05-25 MED ORDER — NITROGLYCERIN 0.4 MG SL SUBL: 0.4000 mg | SUBLINGUAL_TABLET | SUBLINGUAL | Status: DC | PRN

## 2013-05-25 MED ORDER — FAMOTIDINE 20 MG PO TABS: ORAL_TABLET | ORAL | Status: DC

## 2013-05-25 NOTE — Progress Notes (Signed)
Patient ID: SHAHZAIB AZEVEDO, male   DOB: 1930/04/16, 79 y.o.   MRN: [id number]     Jkwon seen today in followup for his coronary artery disease, AAA with distal dissection, and some mild shortness of breath. He also has a chronic right bundle branch block. He has not any significant chest pain. His last Myoview was Dec 29, 2007 there is a fixed defect in the inferior wall suggesting possible previous infarction with no really significant ischemia. His EF was 69%. He's had previous stents to the RCA and circumflex. His last catheter was in July of 2005. At that time he received 2 stents to the RCA. The circumflex coronary artery stent was done prior to 2005. He is active with his grandchildren and silver sneakers at the [facility] [employer] [name] He has no angiina PND orthopnea no palpitations or syncope.  Reviewed Abdominal US from 2/12 and Ao only 2.3 by 2.5 cm and distal disection not visible with no false lumen  Has had recurrent weak episodes since back on cozaar and BP/HR low at [facility]. Has had bradycardia in past and elevated K  Feels better off cozaar and beta blocker with better hemodynamics  Needs refill on nitro   ROS: Denies fever, malais, weight loss, blurry vision, decreased visual acuity, cough, sputum, SOB, hemoptysis, pleuritic pain, palpitaitons, heartburn, abdominal pain, melena, lower extremity edema, claudication, or rash.  All other systems reviewed and negative  General: Affect appropriate Healthy:  appears stated age HEENT: normal Neck supple with no adenopathy JVP normal no bruits no thyromegaly Lungs clear with no wheezing and good diaphragmatic motion Heart:  S1/S2 no murmur, no rub, gallop or click PMI normal Abdomen: benighn, BS positve, no tenderness, no AAA no bruit.  No HSM or HJR Distal pulses intact with no bruits No edema Neuro non-focal Skin warm and dry No muscular weakness   Current Outpatient Prescriptions  Medication Sig Dispense Refill  . albuterol (VENTOLIN HFA)  108 (90 BASE) MCG/ACT inhaler Inhale 2 puffs into the lungs every 6 (six) hours as needed.  18 g  3  . aspirin 325 MG tablet Take 325 mg by mouth daily.        . clopidogrel (PLAVIX) 75 MG tablet Take 1 tablet (75 mg total) by mouth daily.  90 tablet  1  . DULERA 100-5 MCG/ACT AERO Inhale 2 puffs into the lungs 2 (two) times daily.  3 Inhaler  3  . famotidine (PEPCID) 20 MG tablet One at bedtime  30 tablet  2  . isosorbide mononitrate (IMDUR) 60 MG 24 hr tablet Take 1 tablet (60 mg total) by mouth daily.  90 tablet  1  . loratadine (CLARITIN) 10 MG tablet Take 10 mg by mouth daily.      . metFORMIN (GLUCOPHAGE) 500 MG tablet Take 1 tablet (500 mg total) by mouth daily with breakfast.  90 tablet  1  . montelukast (SINGULAIR) 10 MG tablet Take 1 tablet (10 mg total) by mouth at bedtime.  90 tablet  1  . nitroGLYCERIN (NITROSTAT) 0.4 MG SL tablet Place 0.4 mg under the tongue every 5 (five) minutes as needed.        . pantoprazole (PROTONIX) 40 MG tablet Take 1 tablet (40 mg total) by mouth daily. Take 30-60 min before first meal of the day  30 tablet  2  . pravastatin (PRAVACHOL) 40 MG tablet Take 1 tablet (40 mg total) by mouth daily.  90 tablet  3   No current facility-administered medications  for this visit.    Allergies  Review of patient's allergies indicates no known allergies.  Electrocardiogram:  SR rate 58 first degree RBBB   Assessment and Plan

## 2013-05-25 NOTE — Assessment & Plan Note (Signed)
Stable with no angina and good activity level.  Continue medical Rx Refill on nitro called in  

## 2013-05-25 NOTE — Patient Instructions (Addendum)
Work on inhaler technique:  relax and gently blow all the way out then take a nice smooth deep breath back in, triggering the inhaler at same time you start breathing in.  Hold for up to 5 seconds if you can.  Rinse and gargle with water when done  Pantoprazole (protonix) 40 mg   Take 30-60 min before first meal of the day and Pepcid 20 mg one bedtime until return to office - this is the best way to tell whether stomach acid is contributing to your problem.    GERD (REFLUX)  is an extremely common cause of respiratory symptoms, many times with no significant heartburn at all.    It can be treated with medication, but also with lifestyle changes including avoidance of late meals, excessive alcohol, smoking cessation, and avoid fatty foods, chocolate, peppermint, colas, red wine, and acidic juices such as orange juice.  NO MINT OR MENTHOL PRODUCTS SO NO COUGH DROPS  USE SUGARLESS CANDY INSTEAD (jolley ranchers or Stover's)  NO OIL BASED VITAMINS - use powdered substitutes - STOP  FISH OIL and eat salmon twice weekly instead    Please schedule a follow up office visit in 6 weeks, call sooner if needed with pfts on return

## 2013-05-25 NOTE — Assessment & Plan Note (Signed)
Stable no high grade AV block  Risk of PAF high but no symptoms at this point

## 2013-05-25 NOTE — Assessment & Plan Note (Signed)
F/U Wert  Inhalers adjusted  On anti reflux measures No wheezing today

## 2013-05-25 NOTE — Patient Instructions (Signed)
Your physician wants you to follow-up in:  6 MONTHS WITH DR NISHAN  You will receive a reminder letter in the mail two months in advance. If you don't receive a letter, please call our office to schedule the follow-up appointment. Your physician recommends that you continue on your current medications as directed. Please refer to the Current Medication list given to you today. 

## 2013-05-25 NOTE — Assessment & Plan Note (Signed)
Cholesterol is at goal.  Continue current dose of statin and diet Rx.  No myalgias or side effects.  F/U  LFT's in 6 months. Lab Results  Component Value Date   LDLCALC 95 12/16/2012             

## 2013-05-25 NOTE — Assessment & Plan Note (Signed)
Benign abdomen and CT with healing BP under good control  Stable

## 2013-05-25 NOTE — Progress Notes (Signed)
  Subjective:    Patient ID: Phillip Jordan, male    DOB: [birth year]   MRN: [id number]  Brief patient profile:  79  year old male, ex-smoker (1ppd x 30 years, quit 1990s), diagnosed with COPD per hx by Dr Shelle Iron (fev1 1.38L/44%, ratio 51 in Jan 2009). This is new visit since last visit here was > 3 years ago.           05/25/2013 consult/ [name] re: sob/ prev eval MR with GOLD II COPD Chief Complaint  Patient presents with  . Pulmonary Consult    Referred per Dr. [name]. The pt c/o DOE for the past 15 years, but worse for the past 6 months. He states that he gets SOB with walking up hills, but does okay with his exercise program 2-3 times per wk. He also c/o hoarseness and cough "for a long time"- cough is prod in the am with minimal clear sputum.      All his days are about the same in terms of activity tolerance  Breathing got better p quit smoking then worse again starting around mid 1990s Better on dulera, no better on spiriva ? Worse hoarseness so stopped it  No obvious day to day or daytime variabilty or assoc chronic cough or cp or chest tightness, subjective wheeze overt sinus or hb symptoms. No unusual exp hx or h/o childhood pna/ asthma or knowledge of premature birth.    Also denies any obvious fluctuation of symptoms with weather or environmental changes or other aggravating or alleviating factors except as outlined above   Current Medications, Allergies, Complete Past Medical History, Past Surgical History, Family History, and Social History were reviewed in [employer] record.  ROS  The following are not active complaints unless bolded sore throat, dysphagia, dental problems, itching, sneezing,  nasal congestion or excess/ purulent secretions, ear ache,   fever, chills, sweats, unintended wt loss, pleuritic or exertional cp, hemoptysis,  orthopnea pnd or leg swelling, presyncope, palpitations, heartburn, abdominal pain, anorexia, nausea, vomiting,  diarrhea  or change in bowel or urinary habits, change in stools or urine, dysuria,hematuria,  rash, arthralgias, visual complaints, headache, numbness weakness or ataxia or problems with walking or coordination,  change in mood/affect or memory.          Objective:   Physical Exam amb hoarse wm nad, somewhat congested cough nad Wt Readings from Last 3 Encounters:  05/25/13 174 lb 6.4 oz (79.107 kg)  05/25/13 175 lb 9.6 oz (79.652 kg)  05/16/13 174 lb 12.8 oz (79.289 kg)    HEENT mild turbinate edema.  Oropharynx no thrush or excess pnd or cobblestoning.  No JVD or cervical adenopathy. Mild accessory muscle hypertrophy. Trachea midline, nl thryroid. Chest was hyperinflated by percussion with diminished breath sounds and moderate increased exp time without wheeze. Hoover sign positive at mid inspiration. Regular rate and rhythm without murmur gallop or rub or increase P2 or edema.  Abd: no hsm, nl excursion. Ext warm without cyanosis or clubbing.       cxr rec 05/25/13 not done  Assessment & Plan:

## 2013-05-26 ENCOUNTER — Encounter: Payer: Self-pay | Admitting: Internal Medicine

## 2013-05-26 NOTE — Addendum Note (Signed)
Addended by: Sandrea Hughs B on: 05/26/2013 08:38 AM   Modules accepted: Level of Service

## 2013-05-26 NOTE — Assessment & Plan Note (Addendum)
-   PFT's 07/02/11  FEV1  1.41 (55%) ratio 66 and 12% better p B2 with DLCO 96% - Spiriva and other mdi results in hoarseness  Symptoms are markedly disproportionate to objective findings and not clear this is all a lung problem but pt does appear to have difficult airway management issues. DDX of  difficult airways managment all start with A and  include Adherence, Ace Inhibitors, Acid Reflux, Active Sinus Disease, Alpha 1 Antitripsin deficiency, Anxiety masquerading as Airways dz,  ABPA,  allergy(esp in young), Aspiration (esp in elderly), Adverse effects of DPI,  Active smokers, plus two Bs  = Bronchiectasis and Beta blocker use..and one C= CHF   Adherence is always the initial "prime suspect" and is a multilayered concern that requires a "trust but verify" approach in every patient - starting with knowing how to use medications, especially inhalers, correctly, keeping up with refills and understanding the fundamental difference between maintenance and prns vs those medications only taken for a very short course and then stopped and not refilled. The proper method of use, as well as anticipated side effects, of a metered-dose inhaler are discussed and demonstrated to the patient. Improved effectiveness after extensive coaching during this visit to a level of approximately  75% so continue dulera  ? Acid (or non-acid) GERD > always difficult to exclude as up to 75% of pts in some series report no assoc GI/ Heartburn symptoms> rec max (24h)  acid suppression and diet restrictions/ reviewed and instructions given in writting   ? Adverse effects of meds, esp DPI's > avoid for now   See instructions for specific recommendations which were reviewed directly with the patient who was given a copy with highlighter outlining the key components.

## 2013-06-05 ENCOUNTER — Ambulatory Visit (INDEPENDENT_AMBULATORY_CARE_PROVIDER_SITE_OTHER)
Admission: RE | Admit: 2013-06-05 | Discharge: 2013-06-05 | Disposition: A | Discharge status: Home / Self Care | Payer: Medicare Other | Source: Ambulatory Visit | Attending: Cardiovascular Disease | Admitting: Cardiovascular Disease

## 2013-06-05 ENCOUNTER — Encounter: Payer: Self-pay | Admitting: *Deleted

## 2013-06-05 ENCOUNTER — Other Ambulatory Visit: Payer: Self-pay | Admitting: Cardiovascular Disease

## 2013-06-05 ENCOUNTER — Ambulatory Visit (INDEPENDENT_AMBULATORY_CARE_PROVIDER_SITE_OTHER): Payer: Medicare Other | Admitting: Cardiovascular Disease

## 2013-06-05 ENCOUNTER — Encounter: Payer: Self-pay | Admitting: Cardiovascular Disease

## 2013-06-05 VITALS — BP 124/76 | HR 59 | Ht 69.0 in | Wt 178.0 lb

## 2013-06-05 DIAGNOSIS — R079 Chest pain, unspecified: Secondary | ICD-10-CM

## 2013-06-05 DIAGNOSIS — I7100 Dissection of unspecified site of aorta: Secondary | ICD-10-CM

## 2013-06-05 DIAGNOSIS — R0602 Shortness of breath: Secondary | ICD-10-CM

## 2013-06-05 DIAGNOSIS — J449 Chronic obstructive pulmonary disease, unspecified: Secondary | ICD-10-CM

## 2013-06-05 DIAGNOSIS — Z01810 Encounter for preprocedural cardiovascular examination: Secondary | ICD-10-CM

## 2013-06-05 DIAGNOSIS — I2510 Atherosclerotic heart disease of native coronary artery without angina pectoris: Secondary | ICD-10-CM

## 2013-06-05 LAB — PROTIME-INR
INR: 1.1 ratio — ABNORMAL HIGH (ref 0.8–1.0)
Prothrombin Time: 11.4 s (ref 10.2–12.4)

## 2013-06-05 LAB — CBC WITH DIFFERENTIAL/PLATELET
Basophils Absolute: 0.1 10*3/uL (ref 0.0–0.1)
Basophils Relative: 0.8 % (ref 0.0–3.0)
Eosinophils Absolute: 0.3 10*3/uL (ref 0.0–0.7)
HCT: 43 % (ref 39.0–52.0)
Lymphocytes Relative: 17.9 % (ref 12.0–46.0)
Lymphs Abs: 1.9 10*3/uL (ref 0.7–4.0)
MCHC: 33.8 g/dL (ref 30.0–36.0)
Neutrophils Relative %: 71.5 % (ref 43.0–77.0)
Platelets: 246 10*3/uL (ref 150.0–400.0)
RBC: 4.8 Mil/uL (ref 4.22–5.81)
RDW: 12.9 % (ref 11.5–14.6)
WBC: 10.8 10*3/uL — ABNORMAL HIGH (ref 4.5–10.5)

## 2013-06-05 LAB — BASIC METABOLIC PANEL
BUN: 14 mg/dL (ref 6–23)
Chloride: 101 mEq/L (ref 96–112)
Creatinine, Ser: 0.9 mg/dL (ref 0.4–1.5)
GFR: 81.85 mL/min (ref >60.00)
Potassium: 4.2 mEq/L (ref 3.5–5.1)
Sodium: 138 mEq/L (ref 135–145)

## 2013-06-05 NOTE — Assessment & Plan Note (Addendum)
F/U Dr Sherene Sires No wheezing today Not clear if dulera exacerbated CAD

## 2013-06-05 NOTE — Assessment & Plan Note (Signed)
Stable by CT  No pain or palpable mass May be worthwhile to do distal aortogram during cath

## 2013-06-05 NOTE — Assessment & Plan Note (Signed)
Cholesterol is at goal.  Continue current dose of statin and diet Rx.  No myalgias or side effects.  F/U  LFT's in 6 months. Lab Results  Component Value Date   LDLCALC 95 12/16/2012             

## 2013-06-05 NOTE — Progress Notes (Signed)
Patient ID: Phillip Jordan, male   DOB: 1930-02-20, 79 y.o.   MRN: [id number] Phillip Jordan seen today in followup for his coronary artery disease, AAA with distal dissection, and some mild shortness of breath. He also has a chronic right bundle branch block. He has not any significant chest pain. His last Myoview was Dec 29, 2007 there is a fixed defect in the inferior wall suggesting possible previous infarction with no really significant ischemia. His EF was 69%. He's had previous stents to the RCA and circumflex. His last catheter was in July of 2005. At that time he received 2 stents to the RCA. The circumflex coronary artery stent was done prior to 2005. He is active with his grandchildren and silver sneakers at the [facility]. [name] He has no angiina PND orthopnea no palpitations or syncope.  Reviewed Abdominal US from 2/12 and Ao only 2.3 by 2.5 cm and distal disection not visible with no false lumen   Has had new onset angina last couple of months Last week took nitro twice with relief  Happening when he over exerts himself cleaning pool or cooking at church Pain not resting and goes away with one nitro.  Taking his plavix still  Has had multiple issues with previous femoral caths including evacuation of hematoma and surgery Needed by Dr Phillip Jordan  Given this and distal disection history would be nice to do cath from radial approach.  Seen by Dr Phillip Jordan recently and started on protonix and dilura for wheezing   ROS: Denies fever, malais, weight loss, blurry vision, decreased visual acuity, cough, sputum, SOB, hemoptysis, pleuritic pain, palpitaitons, heartburn, abdominal pain, melena, lower extremity edema, claudication, or rash.  All other systems reviewed and negative  General: Affect appropriate Healthy:  appears stated age HEENT: normal Neck supple with no adenopathy JVP normal no bruits no thyromegaly Lungs clear with no wheezing and good diaphragmatic motion Heart:  S1/S2 no murmur, no rub, gallop or  click PMI normal Abdomen: benighn, BS positve, no tenderness, no AAA no bruit.  No HSM or HJR Distal pulses intact with no bruits No edema Neuro non-focal Skin warm and dry No muscular weakness   Current Outpatient Prescriptions  Medication Sig Dispense Refill  . albuterol (VENTOLIN HFA) 108 (90 BASE) MCG/ACT inhaler Inhale 2 puffs into the lungs every 6 (six) hours as needed.  18 g  3  . aspirin 325 MG tablet Take 325 mg by mouth daily.        . clopidogrel (PLAVIX) 75 MG tablet Take 1 tablet (75 mg total) by mouth daily.  90 tablet  1  . DULERA 100-5 MCG/ACT AERO Inhale 2 puffs into the lungs 2 (two) times daily.  3 Inhaler  3  . famotidine (PEPCID) 20 MG tablet One at bedtime  30 tablet  2  . isosorbide mononitrate (IMDUR) 60 MG 24 hr tablet Take 1 tablet (60 mg total) by mouth daily.  90 tablet  1  . loratadine (CLARITIN) 10 MG tablet Take 10 mg by mouth daily.      . metFORMIN (GLUCOPHAGE) 500 MG tablet Take 1 tablet (500 mg total) by mouth daily with breakfast.  90 tablet  1  . montelukast (SINGULAIR) 10 MG tablet Take 1 tablet (10 mg total) by mouth at bedtime.  90 tablet  1  . nitroGLYCERIN (NITROSTAT) 0.4 MG SL tablet Place 1 tablet (0.4 mg total) under the tongue every 5 (five) minutes as needed.  25 tablet  4  . pantoprazole (PROTONIX)  40 MG tablet Take 1 tablet (40 mg total) by mouth daily. Take 30-60 min before first meal of the day  30 tablet  2  . pravastatin (PRAVACHOL) 40 MG tablet Take 1 tablet (40 mg total) by mouth daily.  90 tablet  3   No current facility-administered medications for this visit.    Allergies  Review of patient's allergies indicates no known allergies.  Electrocardiogram:   SR [id number] RBBB PR 224  ? Old IMI     Assessment and Plan

## 2013-06-05 NOTE — Assessment & Plan Note (Signed)
Known history of stents New onset angina  Needs cath Scheduled for Friday Knows to go to [facility] by EMS if has more pain unresponsive to nitro Labs and CXR today  Orders done

## 2013-06-05 NOTE — Patient Instructions (Addendum)
Your physician has requested that you have a cardiac catheterization. Cardiac catheterization is used to diagnose and/or treat various heart conditions. Doctors may recommend this procedure for a number of different reasons. The most common reason is to evaluate chest pain. Chest pain can be a symptom of coronary artery disease (CAD), and cardiac catheterization can show whether plaque is narrowing or blocking your heart's arteries. This procedure is also used to evaluate the valves, as well as measure the blood flow and oxygen levels in different parts of your heart. For further information please visit [email or web address] Please follow instruction sheet, as given.   Your physician recommends that you return for lab work in:  TODAY  BMET  CBC WITH DIFF INR A chest x-ray takes a picture of the organs and structures inside the chest, including the heart, lungs, and blood vessels. This test can show several things, including, whether the heart is enlarges; whether fluid is building up in the lungs; and whether pacemaker / defibrillator leads are still in place.  [facility]

## 2013-06-06 ENCOUNTER — Encounter (HOSPITAL_COMMUNITY): Payer: Self-pay | Admitting: Pharmacy Technician

## 2013-06-07 ENCOUNTER — Telehealth: Payer: Self-pay | Admitting: Cardiovascular Disease

## 2013-06-07 NOTE — Telephone Encounter (Signed)
Returning your call. °

## 2013-06-07 NOTE — Telephone Encounter (Signed)
OLD MESSAGE ./CY 

## 2013-06-09 ENCOUNTER — Inpatient Hospital Stay (HOSPITAL_COMMUNITY)
Admission: RE | Admit: 2013-06-09 | Discharge: 2013-06-13 | DRG: 246 | Disposition: A | Discharge status: Home / Self Care | Payer: Medicare Other | Source: Ambulatory Visit | Attending: Cardiovascular Disease | Admitting: Cardiovascular Disease

## 2013-06-09 ENCOUNTER — Encounter (HOSPITAL_COMMUNITY)
Admission: RE | Disposition: A | Discharge status: Home / Self Care | Payer: Self-pay | Source: Ambulatory Visit | Attending: Cardiovascular Disease

## 2013-06-09 DIAGNOSIS — E119 Type 2 diabetes mellitus without complications: Secondary | ICD-10-CM | POA: Diagnosis present

## 2013-06-09 DIAGNOSIS — Z955 Presence of coronary angioplasty implant and graft: Secondary | ICD-10-CM

## 2013-06-09 DIAGNOSIS — Z79899 Other long term (current) drug therapy: Secondary | ICD-10-CM

## 2013-06-09 DIAGNOSIS — J4489 Other specified chronic obstructive pulmonary disease: Secondary | ICD-10-CM | POA: Diagnosis present

## 2013-06-09 DIAGNOSIS — J449 Chronic obstructive pulmonary disease, unspecified: Secondary | ICD-10-CM | POA: Diagnosis present

## 2013-06-09 DIAGNOSIS — I200 Unstable angina: Secondary | ICD-10-CM | POA: Diagnosis present

## 2013-06-09 DIAGNOSIS — B029 Zoster without complications: Secondary | ICD-10-CM | POA: Diagnosis present

## 2013-06-09 DIAGNOSIS — I4510 Unspecified right bundle-branch block: Secondary | ICD-10-CM | POA: Diagnosis present

## 2013-06-09 DIAGNOSIS — I2510 Atherosclerotic heart disease of native coronary artery without angina pectoris: Principal | ICD-10-CM | POA: Diagnosis present

## 2013-06-09 DIAGNOSIS — I7102 Dissection of abdominal aorta: Secondary | ICD-10-CM | POA: Diagnosis present

## 2013-06-09 DIAGNOSIS — E785 Hyperlipidemia, unspecified: Secondary | ICD-10-CM | POA: Diagnosis present

## 2013-06-09 DIAGNOSIS — J309 Allergic rhinitis, unspecified: Secondary | ICD-10-CM | POA: Diagnosis present

## 2013-06-09 DIAGNOSIS — Z6826 Body mass index (BMI) 26.0-26.9, adult: Secondary | ICD-10-CM

## 2013-06-09 DIAGNOSIS — R079 Chest pain, unspecified: Secondary | ICD-10-CM

## 2013-06-09 DIAGNOSIS — Z7982 Long term (current) use of aspirin: Secondary | ICD-10-CM

## 2013-06-09 DIAGNOSIS — Z7902 Long term (current) use of antithrombotics/antiplatelets: Secondary | ICD-10-CM

## 2013-06-09 DIAGNOSIS — Z23 Encounter for immunization: Secondary | ICD-10-CM

## 2013-06-09 HISTORY — PX: LEFT HEART CATHETERIZATION WITH CORONARY ANGIOGRAM: SHX5451

## 2013-06-09 LAB — MRSA PCR SCREENING: MRSA by PCR: NEGATIVE

## 2013-06-09 LAB — POCT ACTIVATED CLOTTING TIME: Activated Clotting Time: 467 seconds

## 2013-06-09 LAB — GLUCOSE, CAPILLARY
Glucose-Capillary: 113 mg/dL — ABNORMAL HIGH (ref 70–99)
Glucose-Capillary: 110 mg/dL — ABNORMAL HIGH (ref 70–99)

## 2013-06-09 SURGERY — LEFT HEART CATHETERIZATION WITH CORONARY ANGIOGRAM

## 2013-06-09 MED ORDER — ONDANSETRON HCL 4 MG/2ML IJ SOLN: 4.0000 mg | Freq: Four times a day (QID) | INTRAMUSCULAR | Status: DC | PRN

## 2013-06-09 MED ORDER — ACETAMINOPHEN 325 MG PO TABS: 650.0000 mg | ORAL_TABLET | ORAL | Status: DC | PRN

## 2013-06-09 MED ORDER — ACYCLOVIR 200 MG PO CAPS
800.0000 mg | ORAL_CAPSULE | Freq: Every day | ORAL | Status: DC
  Administered 2013-06-09 – 2013-06-10 (×4): 800 mg via ORAL
  Filled 2013-06-09 (×11): qty 4

## 2013-06-09 MED ORDER — SODIUM CHLORIDE 0.9 % IJ SOLN: 3.0000 mL | INTRAMUSCULAR | Status: DC | PRN

## 2013-06-09 MED ORDER — HEPARIN (PORCINE) IN NACL 2-0.9 UNIT/ML-% IJ SOLN
INTRAMUSCULAR | Status: AC
  Filled 2013-06-09: qty 1000

## 2013-06-09 MED ORDER — ISOSORBIDE MONONITRATE ER 60 MG PO TB24
60.0000 mg | ORAL_TABLET | Freq: Every day | ORAL | Status: DC
  Administered 2013-06-10 – 2013-06-13 (×4): 60 mg via ORAL
  Filled 2013-06-09 (×4): qty 1

## 2013-06-09 MED ORDER — PANTOPRAZOLE SODIUM 40 MG PO TBEC
40.0000 mg | DELAYED_RELEASE_TABLET | Freq: Every day | ORAL | Status: DC
  Administered 2013-06-10 – 2013-06-13 (×4): 40 mg via ORAL
  Filled 2013-06-09 (×4): qty 1

## 2013-06-09 MED ORDER — FAMOTIDINE 20 MG PO TABS
20.0000 mg | ORAL_TABLET | Freq: Every day | ORAL | Status: DC
  Administered 2013-06-09 – 2013-06-12 (×4): 20 mg via ORAL
  Filled 2013-06-09 (×5): qty 1

## 2013-06-09 MED ORDER — ASPIRIN 81 MG PO CHEW: 81.0000 mg | CHEWABLE_TABLET | ORAL | Status: DC

## 2013-06-09 MED ORDER — SODIUM CHLORIDE 0.9 % IV SOLN: 250.0000 mL | INTRAVENOUS | Status: DC | PRN

## 2013-06-09 MED ORDER — MOMETASONE FURO-FORMOTEROL FUM 100-5 MCG/ACT IN AERO
2.0000 | INHALATION_SPRAY | Freq: Two times a day (BID) | RESPIRATORY_TRACT | Status: DC
  Administered 2013-06-09 – 2013-06-13 (×8): 2 via RESPIRATORY_TRACT
  Filled 2013-06-09: qty 8.8

## 2013-06-09 MED ORDER — SODIUM CHLORIDE 0.9 % IV SOLN: INTRAVENOUS | Status: AC

## 2013-06-09 MED ORDER — HEPARIN SODIUM (PORCINE) 1000 UNIT/ML IJ SOLN
INTRAMUSCULAR | Status: AC
  Filled 2013-06-09: qty 1

## 2013-06-09 MED ORDER — MONTELUKAST SODIUM 10 MG PO TABS
10.0000 mg | ORAL_TABLET | Freq: Every day | ORAL | Status: DC
  Administered 2013-06-09 – 2013-06-12 (×4): 10 mg via ORAL
  Filled 2013-06-09 (×5): qty 1

## 2013-06-09 MED ORDER — VERAPAMIL HCL 2.5 MG/ML IV SOLN
INTRAVENOUS | Status: AC
  Filled 2013-06-09: qty 2

## 2013-06-09 MED ORDER — SODIUM CHLORIDE 0.9 % IJ SOLN: 3.0000 mL | Freq: Two times a day (BID) | INTRAMUSCULAR | Status: DC

## 2013-06-09 MED ORDER — NITROGLYCERIN 0.4 MG SL SUBL: 0.4000 mg | SUBLINGUAL_TABLET | SUBLINGUAL | Status: DC | PRN

## 2013-06-09 MED ORDER — BIVALIRUDIN 250 MG IV SOLR
INTRAVENOUS | Status: AC
  Filled 2013-06-09: qty 250

## 2013-06-09 MED ORDER — CLOPIDOGREL BISULFATE 300 MG PO TABS
ORAL_TABLET | ORAL | Status: AC
  Filled 2013-06-09: qty 1

## 2013-06-09 MED ORDER — HYDROCODONE-ACETAMINOPHEN 5-325 MG PO TABS
1.0000 | ORAL_TABLET | ORAL | Status: DC | PRN
Start: 2013-06-09 — End: 2013-06-13
  Administered 2013-06-13: 2 via ORAL
  Filled 2013-06-09: qty 2

## 2013-06-09 MED ORDER — LIDOCAINE HCL (PF) 1 % IJ SOLN
INTRAMUSCULAR | Status: AC
  Filled 2013-06-09: qty 30

## 2013-06-09 MED ORDER — ADULT MULTIVITAMIN W/MINERALS CH
1.0000 | ORAL_TABLET | Freq: Every day | ORAL | Status: DC
  Administered 2013-06-10 – 2013-06-13 (×4): 1 via ORAL
  Filled 2013-06-09 (×4): qty 1

## 2013-06-09 MED ORDER — FENTANYL CITRATE 0.05 MG/ML IJ SOLN
INTRAMUSCULAR | Status: AC
  Filled 2013-06-09: qty 2

## 2013-06-09 MED ORDER — INFLUENZA VAC SPLIT QUAD 0.5 ML IM SUSP
0.5000 mL | INTRAMUSCULAR | Status: AC
  Administered 2013-06-10: 0.5 mL via INTRAMUSCULAR
  Filled 2013-06-09: qty 0.5

## 2013-06-09 MED ORDER — CLOPIDOGREL BISULFATE 75 MG PO TABS
75.0000 mg | ORAL_TABLET | Freq: Every day | ORAL | Status: DC
  Administered 2013-06-10 – 2013-06-13 (×3): 75 mg via ORAL
  Filled 2013-06-09 (×4): qty 1

## 2013-06-09 MED ORDER — ASPIRIN 81 MG PO CHEW
CHEWABLE_TABLET | ORAL | Status: AC
  Filled 2013-06-09: qty 1

## 2013-06-09 MED ORDER — NITROGLYCERIN 0.2 MG/ML ON CALL CATH LAB
INTRAVENOUS | Status: AC
  Filled 2013-06-09: qty 1

## 2013-06-09 MED ORDER — SIMVASTATIN 10 MG PO TABS
10.0000 mg | ORAL_TABLET | Freq: Every day | ORAL | Status: DC
  Administered 2013-06-09 – 2013-06-11 (×3): 10 mg via ORAL
  Filled 2013-06-09 (×5): qty 1

## 2013-06-09 MED ORDER — ASPIRIN 325 MG PO TABS
325.0000 mg | ORAL_TABLET | Freq: Every day | ORAL | Status: DC
  Administered 2013-06-10 – 2013-06-13 (×4): 325 mg via ORAL
  Filled 2013-06-09 (×4): qty 1

## 2013-06-09 MED ORDER — MIDAZOLAM HCL 2 MG/2ML IJ SOLN
INTRAMUSCULAR | Status: AC
  Filled 2013-06-09: qty 2

## 2013-06-09 NOTE — Progress Notes (Addendum)
Asked by fellow PA to check on Phillip Jordan for concern for shingles. She has already initiated treatment with acyclovir. I agree that this looks like localized shingles - cluster of erythematous vesicular lesions on L thorax. However, the patient states these may have been present for more than a week or two. It is uncertain if antiviral is likely to help at this point but will continue this course. She has also made pain medicine available. Droplet precautions are in place. Dayna Dunn PA-C

## 2013-06-09 NOTE — CV Procedure (Signed)
   Cardiac Catheterization Procedure Note  Name: Phillip Jordan MRN: [id number] DOB: 1930-02-06  Procedure: Left Heart Cath, Selective Coronary Angiography, LV angiography  Indication:  New onset angina   Procedural details: The right groin was prepped, draped, and anesthetized with 1% lidocaine. Using modified Seldinger technique, a 5 French sheath was introduced into the right femoral artery. Standard Judkins catheters were used for coronary angiography and left ventriculography. Catheter exchanges were performed over a guidewire. There were no immediate procedural complications. The patient was transferred to the post catheterization recovery area for further monitoring.  Procedural Findings: Hemodynamics:  AO 13 76 LV 128 20   Coronary angiography: Coronary dominance: right  Left mainstem:  normal  Left anterior descending (LAD): Small vessel not suitalbe for bypass.  90% mid vessel lesion    D1: small vessle 80% proximal  D2: 90% mid  Left circumflex (LCx): small vessel with widely patent stent to OM1  Right coronary artery (RCA): large vessel with 3 areas of non contiguous stents.  90% stenosis between two stents in the distal vessel with good run off to large PDA/PLA system  Left ventriculography: Left ventricular systolic function is normal, LVEF is estimated at 55-65%, there is no significant mitral regurgitation   Final Conclusions:  Severe 2VD  Recommendations: Reviewed with CM.  LAD is not ideal for LIMA and he is elderly in his 69's.  I think distal RCA is culprit with his new onset angina as LAD has been diseased Favor Stenting RCA today Admit with heparin.  Bring back Monday for stenting of LAD and D2  [name] 06/09/2013, 12:45 PM

## 2013-06-09 NOTE — H&P (View-Only) (Signed)
Patient ID: Phillip Jordan, male   DOB: 07/14/1930, 79 y.o.   MRN: [id number] Ryson seen today in followup for his coronary artery disease, AAA with distal dissection, and some mild shortness of breath. He also has a chronic right bundle branch block. He has not any significant chest pain. His last Myoview was Dec 29, 2007 there is a fixed defect in the inferior wall suggesting possible previous infarction with no really significant ischemia. His EF was 69%. He's had previous stents to the RCA and circumflex. His last catheter was in July of 2005. At that time he received 2 stents to the RCA. The circumflex coronary artery stent was done prior to 2005. He is active with his grandchildren and silver sneakers at the [employer]. . He has no angiina PND orthopnea no palpitations or syncope.  Reviewed Abdominal US from 2/12 and Ao only 2.3 by 2.5 cm and distal disection not visible with no false lumen   Has had new onset angina last couple of months Last week took nitro twice with relief  Happening when he over exerts himself cleaning pool or cooking at church Pain not resting and goes away with one nitro.  Taking his plavix still  Has had multiple issues with previous femoral caths including evacuation of hematoma and surgery Needed by Dr Burnery  Given this and distal disection history would be nice to do cath from radial approach.  Seen by Dr Wert recently and started on protonix and dilura for wheezing   ROS: Denies fever, malais, weight loss, blurry vision, decreased visual acuity, cough, sputum, SOB, hemoptysis, pleuritic pain, palpitaitons, heartburn, abdominal pain, melena, lower extremity edema, claudication, or rash.  All other systems reviewed and negative  General: Affect appropriate Healthy:  appears stated age HEENT: normal Neck supple with no adenopathy JVP normal no bruits no thyromegaly Lungs clear with no wheezing and good diaphragmatic motion Heart:  S1/S2 no murmur, no rub, gallop or  click PMI normal Abdomen: benighn, BS positve, no tenderness, no AAA no bruit.  No HSM or HJR Distal pulses intact with no bruits No edema Neuro non-focal Skin warm and dry No muscular weakness   Current Outpatient Prescriptions  Medication Sig Dispense Refill  . albuterol (VENTOLIN HFA) 108 (90 BASE) MCG/ACT inhaler Inhale 2 puffs into the lungs every 6 (six) hours as needed.  18 g  3  . aspirin 325 MG tablet Take 325 mg by mouth daily.        . clopidogrel (PLAVIX) 75 MG tablet Take 1 tablet (75 mg total) by mouth daily.  90 tablet  1  . DULERA 100-5 MCG/ACT AERO Inhale 2 puffs into the lungs 2 (two) times daily.  3 Inhaler  3  . famotidine (PEPCID) 20 MG tablet One at bedtime  30 tablet  2  . isosorbide mononitrate (IMDUR) 60 MG 24 hr tablet Take 1 tablet (60 mg total) by mouth daily.  90 tablet  1  . loratadine (CLARITIN) 10 MG tablet Take 10 mg by mouth daily.      . metFORMIN (GLUCOPHAGE) 500 MG tablet Take 1 tablet (500 mg total) by mouth daily with breakfast.  90 tablet  1  . montelukast (SINGULAIR) 10 MG tablet Take 1 tablet (10 mg total) by mouth at bedtime.  90 tablet  1  . nitroGLYCERIN (NITROSTAT) 0.4 MG SL tablet Place 1 tablet (0.4 mg total) under the tongue every 5 (five) minutes as needed.  25 tablet  4  . pantoprazole (PROTONIX)   40 MG tablet Take 1 tablet (40 mg total) by mouth daily. Take 30-60 min before first meal of the day  30 tablet  2  . pravastatin (PRAVACHOL) 40 MG tablet Take 1 tablet (40 mg total) by mouth daily.  90 tablet  3   No current facility-administered medications for this visit.    Allergies  Review of patient's allergies indicates no known allergies.  Electrocardiogram:   SR [id number] RBBB PR 224  ? Old IMI     Assessment and Plan  

## 2013-06-09 NOTE — Interval H&P Note (Signed)
Cath Lab Visit (complete for each Cath Lab visit)  Clinical Evaluation Leading to the Procedure:   ACS: no  Non-ACS:    Anginal Classification: CCS III  Anti-ischemic medical therapy: Maximal Therapy (2 or more classes of medications)  Non-Invasive Test Results: No non-invasive testing performed  Prior CABG: No previous CABG      History and Physical Interval Note:  06/09/2013 11:16 AM  Phillip Jordan  has presented today for surgery, with the diagnosis of cad  The various methods of treatment have been discussed with the patient and family. After consideration of risks, benefits and other options for treatment, the patient has consented to  Procedure(s): LEFT HEART CATHETERIZATION WITH CORONARY ANGIOGRAM (N/A) as a surgical intervention .  The patient's history has been reviewed, patient examined, no change in status, stable for surgery.  I have reviewed the patient's chart and labs.  Questions were answered to the patient's satisfaction.     [name]

## 2013-06-09 NOTE — CV Procedure (Addendum)
     Cardiac Catheterization Operative Report  Phillip Jordan [id number] 10/17/201412:40 PM [name], MD  Procedure Performed:  1. PTCA/DES x 1 distal RCA  Operator: [name], MD  Indication: 79 yo male with unstable angina (class III) with history of CAD. Diagnostic cath this am per Dr. [name]. Pt found to have severe stenosis in the RCA and the LAD. Plans today for PCI of the RCA with staged PCI of the LAD next week.                                      Procedure Details: The risks, benefits, complications, treatment options, and expected outcomes were discussed with the patient before the diagnostic case. The patient and/or family concurred with the proposed plan, giving informed consent. When I entered the case, there was a 5 [place] sheath present in the right femoral artery. I placed a 6 French sheath in the right femoral artery. He was given a weight based bolus of Angiomax and a drip was started. When the ACT was greater than 200, I passed a Cougar IC wire down the RCA. A 2.5 x 12 balloon was used to pre-dilate the the distal RCA stenosis. A 3.0 x 20 mm Promus Premier DES was deployed in the distal RCA. The stent was post-dilated with a 3.25 x 15 mm [place] balloon. There was an excellent angiographic result. The stenosis was taken from 99% down to 0%.   There were no immediate complications. The patient was taken to the recovery area in stable condition.   Hemodynamic Findings: Central aortic pressure: 98/51  Impression: 1. Unstable angina 2. Severe stenosis distal RCA and proximal LAD. 3. Successful PTCA/DES distal RCA  Recommendations: He will need dual anti-platelet with ASA and Plavix for one year. Will stage PCI of LAD on Monday. He will be kept over the weekend for hydration post cath. The intervention of the LAD is necessary as it is a critical stenosis but will be delayed secondary to contrast load given today. No IV heparin over the weekend.          Complications:  None; patient tolerated the procedure well.

## 2013-06-10 ENCOUNTER — Encounter (HOSPITAL_COMMUNITY): Payer: Self-pay | Admitting: *Deleted

## 2013-06-10 DIAGNOSIS — I2510 Atherosclerotic heart disease of native coronary artery without angina pectoris: Secondary | ICD-10-CM

## 2013-06-10 DIAGNOSIS — R079 Chest pain, unspecified: Secondary | ICD-10-CM

## 2013-06-10 LAB — BASIC METABOLIC PANEL
Calcium: 9.4 mg/dL (ref 8.4–10.5)
Chloride: 100 mEq/L (ref 96–112)
Creatinine, Ser: 0.89 mg/dL (ref 0.50–1.35)
GFR calc Af Amer: >90 mL/min (ref >90)
Sodium: 138 mEq/L (ref 135–145)

## 2013-06-10 LAB — CBC
MCH: 30.3 pg (ref 26.0–34.0)
MCV: 88.1 fL (ref 78.0–100.0)
Platelets: 242 10*3/uL (ref 150–400)
RDW: 12.7 % (ref 11.5–15.5)
WBC: 9.6 10*3/uL (ref 4.0–10.5)

## 2013-06-10 MED ORDER — VALACYCLOVIR HCL 500 MG PO TABS
1000.0000 mg | ORAL_TABLET | Freq: Three times a day (TID) | ORAL | Status: DC
  Administered 2013-06-10 – 2013-06-11 (×6): 1000 mg via ORAL
  Filled 2013-06-10 (×9): qty 2

## 2013-06-10 NOTE — Progress Notes (Signed)
Patient ID: Phillip Jordan, male   DOB: July 15, 1930, 79 y.o.   MRN: [id number]    Subjective:  Denies SSCP, palpitations or Dyspnea Shingles left pectoral area Has had for a few weeks  Objective:  Filed Vitals:   06/09/13 2039 06/10/13 0000 06/10/13 0351 06/10/13 0819  BP:  140/69 126/59 157/76  Pulse:    102  Temp:  97.3 F (36.3 C) 98.3 F (36.8 C) 97.4 F (36.3 C)  TempSrc:  Oral Oral Oral  Resp:   17 16  Height:      Weight:      SpO2: 97% 95% 96% 98%    Intake/Output from previous day:  Intake/Output Summary (Last 24 hours) at 06/10/13 1242 Last data filed at 06/10/13 0900  Gross per 24 hour  Intake    360 ml  Output   2475 ml  Net  -2115 ml    Physical Exam: Affect appropriate Healthy:  appears stated age HEENT: normal Neck supple with no adenopathy JVP normal no bruits no thyromegaly Lungs clear with no wheezing and good diaphragmatic motion Heart:  S1/S2 no murmur, no rub, gallop or click PMI normal Abdomen: benighn, BS positve, no tenderness, no AAA no bruit.  No HSM or HJR Distal pulses intact with no bruits No edema Neuro non-focal Skin warm and dry Area of shingles over left anterior ribs  No muscular weakness LFA cath site A mild bruising right radial   Lab Results: Basic Metabolic Panel:  Recent Labs  78/29/56 0437  NA 138  K 4.3  CL 100  CO2 28  GLUCOSE 129*  BUN 12  CREATININE 0.89  CALCIUM 9.4   CBC:  Recent Labs  06/10/13 0437  WBC 9.6  HGB 13.5  HCT 39.3  MCV 88.1  PLT 242    Imaging: No results found.  Cardiac Studies:  ECG:     Telemetry:  NSR no arrhythmia 06/10/2013   Echo: SR RBBB old IMI no acute changes   Medications:   . aspirin  325 mg Oral Daily  . clopidogrel  75 mg Oral Daily  . famotidine  20 mg Oral QHS  . isosorbide mononitrate  60 mg Oral Daily  . mometasone-formoterol  2 puff Inhalation BID  . montelukast  10 mg Oral QHS  . multivitamin with minerals  1 tablet Oral Daily  .  pantoprazole  40 mg Oral QAC breakfast  . simvastatin  10 mg Oral q1800  . valACYclovir  1,000 mg Oral TID       Assessment/Plan:  CAD:  S/P stenting of RCA Plan for stentin of proximal LAD and D2 on Monday.  Continue plavix and nitrates Check P2Y Shingles:  Local skin care valtrex started not much pain Chol: continue statin   [name] 06/10/2013, 12:42 PM

## 2013-06-10 NOTE — Progress Notes (Addendum)
Spoke with Dr. Algis Liming with infectious disease. Dr. Algis Liming stated the patient needed to be on droplet precautions. Dr. Algis Liming stated it was fine to give the patient his flu shot. He felt [name] did not need acyclovir and recommended changing him to Valtrex, 1 g 3 times a day for 10 days which was done.  Phillip Demark, PA-C 06/10/2013 8:48 AM Beeper [phone]

## 2013-06-10 NOTE — Progress Notes (Signed)
Cardiac Rehab.  Arrived to ambulate patient.  Discussion with nurse regarding precautions for shingles.   Air Borne and contact precautions.  Patient  has been up and about in his room, decision made with his nurse that with weeping lesions present we would not ambulate in the hall today.  Wife visiting with patient at this time.Will follow up on Monday after his procedure.   Cathie Olden RN

## 2013-06-11 LAB — PLATELET INHIBITION P2Y12: Platelet Function  P2Y12: 66 [PRU] — ABNORMAL LOW (ref 194–418)

## 2013-06-11 MED ORDER — SODIUM CHLORIDE 0.9 % IJ SOLN
3.0000 mL | Freq: Two times a day (BID) | INTRAMUSCULAR | Status: DC
  Administered 2013-06-11 (×2): 3 mL via INTRAVENOUS

## 2013-06-11 MED ORDER — ASPIRIN 81 MG PO CHEW
81.0000 mg | CHEWABLE_TABLET | ORAL | Status: AC
  Administered 2013-06-12: 81 mg via ORAL
  Filled 2013-06-11: qty 1

## 2013-06-11 MED ORDER — SODIUM CHLORIDE 0.9 % IJ SOLN: 3.0000 mL | INTRAMUSCULAR | Status: DC | PRN

## 2013-06-11 MED ORDER — SODIUM CHLORIDE 0.9 % IV SOLN: 250.0000 mL | INTRAVENOUS | Status: DC | PRN

## 2013-06-11 NOTE — Progress Notes (Signed)
Utilization Review Completed.Phillip Jordan T10/19/2014  

## 2013-06-11 NOTE — Progress Notes (Signed)
Subjective:  No chest discomfort at the present time.  Objective:  Vital Signs in the last 24 hours: BP 125/68  Pulse 74  Temp(Src) 97.9 F (36.6 C) (Oral)  Resp 17  Ht 5\' 9"  (1.753 m)  Wt 80.74 kg (178 lb)  BMI 26.27 kg/m2  SpO2 99%  Physical Exam: Pleasant white male in no acute distress Lungs:  Clear Cardiac:  Regular rhythm, normal S1 and S2, no S3 Extremities:  No edema present, slight ecchymoses at catheterization site  Intake/Output from previous day: 10/18 0701 - 10/19 0700 In: 200 [P.O.:200] Out: 1650 [Urine:1650]  Weight Filed Weights   06/09/13 1024  Weight: 80.74 kg (178 lb)    Lab Results: Basic Metabolic Panel:  Recent Labs  28/41/32 0437  NA 138  K 4.3  CL 100  CO2 28  GLUCOSE 129*  BUN 12  CREATININE 0.89   CBC:  Recent Labs  06/10/13 0437  WBC 9.6  HGB 13.5  HCT 39.3  MCV 88.1  PLT 242    Telemetry: Normal sinus rhythm  Assessment/Plan:  1. Unstable angina pectoris 2. Severe coronary artery disease involving the LAD and diagonal 3. Recent shingles  Recommendations:  Plan staged PCI of the LAD tomorrow. Renal function is stable for procedure. Questions answered.     Darden Palmer  MD [facility]  06/11/2013, 12:01 PM

## 2013-06-12 ENCOUNTER — Encounter (HOSPITAL_COMMUNITY)
Admission: RE | Disposition: A | Discharge status: Home / Self Care | Payer: Self-pay | Source: Ambulatory Visit | Attending: Cardiovascular Disease

## 2013-06-12 ENCOUNTER — Ambulatory Visit (HOSPITAL_COMMUNITY): Admit: 2013-06-12 | Payer: Self-pay | Admitting: Cardiovascular Disease

## 2013-06-12 DIAGNOSIS — I2510 Atherosclerotic heart disease of native coronary artery without angina pectoris: Secondary | ICD-10-CM

## 2013-06-12 HISTORY — PX: PERCUTANEOUS CORONARY STENT INTERVENTION (PCI-S): SHX5485

## 2013-06-12 LAB — POCT ACTIVATED CLOTTING TIME: Activated Clotting Time: 503 seconds

## 2013-06-12 SURGERY — PERCUTANEOUS CORONARY STENT INTERVENTION (PCI-S)
Anesthesia: LOCAL

## 2013-06-12 MED ORDER — FENTANYL CITRATE 0.05 MG/ML IJ SOLN
INTRAMUSCULAR | Status: AC
  Filled 2013-06-12: qty 2

## 2013-06-12 MED ORDER — VERAPAMIL HCL 2.5 MG/ML IV SOLN
INTRAVENOUS | Status: AC
  Filled 2013-06-12: qty 2

## 2013-06-12 MED ORDER — NITROGLYCERIN 0.2 MG/ML ON CALL CATH LAB
INTRAVENOUS | Status: AC
  Filled 2013-06-12: qty 1

## 2013-06-12 MED ORDER — SODIUM CHLORIDE 0.9 % IV SOLN
INTRAVENOUS | Status: AC
  Administered 2013-06-12: 12:00:00 via INTRAVENOUS

## 2013-06-12 MED ORDER — LIDOCAINE HCL (PF) 1 % IJ SOLN
INTRAMUSCULAR | Status: AC
  Filled 2013-06-12: qty 30

## 2013-06-12 MED ORDER — VALACYCLOVIR HCL 500 MG PO TABS
1000.0000 mg | ORAL_TABLET | Freq: Three times a day (TID) | ORAL | Status: DC
  Administered 2013-06-12 – 2013-06-13 (×4): 1000 mg via ORAL
  Filled 2013-06-12 (×6): qty 2

## 2013-06-12 MED ORDER — CLOPIDOGREL BISULFATE 300 MG PO TABS
ORAL_TABLET | ORAL | Status: AC
  Filled 2013-06-12: qty 1

## 2013-06-12 MED ORDER — HEPARIN (PORCINE) IN NACL 2-0.9 UNIT/ML-% IJ SOLN
INTRAMUSCULAR | Status: AC
  Filled 2013-06-12: qty 1000

## 2013-06-12 MED ORDER — CLOPIDOGREL BISULFATE 75 MG PO TABS
ORAL_TABLET | ORAL | Status: AC
  Filled 2013-06-12: qty 1

## 2013-06-12 MED ORDER — BIVALIRUDIN 250 MG IV SOLR
INTRAVENOUS | Status: AC
  Filled 2013-06-12: qty 250

## 2013-06-12 MED ORDER — SODIUM CHLORIDE 0.9 % IV SOLN: INTRAVENOUS | Status: DC

## 2013-06-12 MED ORDER — MIDAZOLAM HCL 2 MG/2ML IJ SOLN
INTRAMUSCULAR | Status: AC
  Filled 2013-06-12: qty 2

## 2013-06-12 MED FILL — Dextrose Inj 5%: INTRAVENOUS | Qty: 1000 | Status: AC

## 2013-06-12 NOTE — Progress Notes (Signed)
Spoke with ID this am regarding proper Isolation precautions for this pt. Shingles are in under left breast- crusted with no drainage. Silicone dressing applied to monitor. No isolation needed for this. Universal precautions are appropriate.

## 2013-06-12 NOTE — Consult Note (Signed)
Nurse reports area of questionable shingles is dried and crusted with NO new vesicles or signs of weeping.  Airborne/Contact Precautions no longer needed. Leana Gamer, RN, CIC Infection Prevention Specialist

## 2013-06-12 NOTE — Interval H&P Note (Signed)
History and Physical Interval Note:  06/12/2013 9:26 AM  Phillip Jordan  has presented today for cardiac cath/PCI with the diagnosis of CAD/unstable angina. Staged PCI of proximal LAD stenosis. LAD has TIMI-2 flow and felt to be critical stenosis. Staged PCI of LAD after PCI of RCA 3 days ago. The RCA also had a severe, hazy stenosis. Given age and contrast load, PCI of LAD planned for today. The various methods of treatment have been discussed with the patient and family. After consideration of risks, benefits and other options for treatment, the patient has consented to  Procedure(s): PERCUTANEOUS CORONARY STENT INTERVENTION (PCI-S) (N/A) as a surgical intervention .  The patient's history has been reviewed, patient examined, no change in status, stable for surgery.  I have reviewed the patient's chart and labs.  Questions were answered to the patient's satisfaction.    Cath Lab Visit (complete for each Cath Lab visit)  Clinical Evaluation Leading to the Procedure:   ACS: no  Non-ACS:    Anginal Classification: CCS III  Anti-ischemic medical therapy: Minimal Therapy (1 class of medications)  Non-Invasive Test Results: No non-invasive testing performed  Prior CABG: No previous CABG        MCALHANY,CHRISTOPHER

## 2013-06-12 NOTE — H&P (View-Only) (Signed)
Subjective:  No chest discomfort at the present time.  Objective:  Vital Signs in the last 24 hours: BP 125/68  Pulse 74  Temp(Src) 97.9 F (36.6 C) (Oral)  Resp 17  Ht 5' 9" (1.753 m)  Wt 80.74 kg (178 lb)  BMI 26.27 kg/m2  SpO2 99%  Physical Exam: Pleasant white male in no acute distress Lungs:  Clear Cardiac:  Regular rhythm, normal S1 and S2, no S3 Extremities:  No edema present, slight ecchymoses at catheterization site  Intake/Output from previous day: 10/18 0701 - 10/19 0700 In: 200 [P.O.:200] Out: 1650 [Urine:1650]  Weight Filed Weights   06/09/13 1024  Weight: 80.74 kg (178 lb)    Lab Results: Basic Metabolic Panel:  Recent Labs  06/10/13 0437  NA 138  K 4.3  CL 100  CO2 28  GLUCOSE 129*  BUN 12  CREATININE 0.89   CBC:  Recent Labs  06/10/13 0437  WBC 9.6  HGB 13.5  HCT 39.3  MCV 88.1  PLT 242    Telemetry: Normal sinus rhythm  Assessment/Plan:  1. Unstable angina pectoris 2. Severe coronary artery disease involving the LAD and diagonal 3. Recent shingles  Recommendations:  Plan staged PCI of the LAD tomorrow. Renal function is stable for procedure. Questions answered.     Phillip Jordan, Jr.  MD [facility] Card[facility]  06/11/2013, 12:01 PM    

## 2013-06-12 NOTE — CV Procedure (Signed)
      Cardiac Catheterization Operative Report  Phillip Jordan [id number] 10/20/20149:31 AM [name], MD  Procedure Performed:  1. PTCA/DES x 1 proximal LAD (lesion extending from the proximal segment into the mid segment.   Operator: [name], MD  Arterial access site:  Right radial artery.   Indication: 79 yo male with history of CAD who was admitted last week with unstable angina. Dr. [name] performed his diagnostic cath 06/09/13 and he was found to have severe stenoses in the RCA and the proximal to mid LAD. The RCA stenosis was treated with a DES. The LAD lesion was felt to be critical. The LAD had TIME-2 flow and a 95-99% lesion with plaque starting in the proximal vessel and extending into the mid vessel. The PCI of the LAD is planned today as the lesion in the proximal to mid LAD is felt to be critical with severe narrowing, compromised flow into the mid and distal LAD.                                      Procedure Details: The risks, benefits, complications, treatment options, and expected outcomes were discussed with the patient. The patient and/or family concurred with the proposed plan, giving informed consent. The patient was brought to the cath lab after IV hydration was begun and oral premedication was given. The patient was further sedated with Versed. The right wrist was assessed with an Allens test which was positive. The right wrist was prepped and draped in a sterile fashion. 1% lidocaine was used for local anesthesia. Using the modified Seldinger access technique, a 5/6 [place] hybrid sheath was placed in the right radial artery. 3 mg Verapamil was given through the sheath. A weight based bolus of Angiomax was given and a drip was started. The left main was engaged with a XB LAD 3.5 guiding catheter. When the ACT was over 200, I passed a Cougar IC wire down the LAD. A 2.0 x 15 mm balloon was used to pre-dilate the long area of stenosis in the proximal to mid  LAD. I then carefully positioned and deployed a 2.5 x 16 mm Promus Premier DES in the proximal LAD. The stent was post-dilated with a 2.75 x 12 mm [place] balloon x 1. The stenosis was taken from 99% down to 0%. The sheath was removed from the right radial artery and a Terumo hemostasis band was applied at the arteriotomy site on the right wrist. There were no immediate complications. The patient was taken to the recovery area in stable condition.   Hemodynamic Findings: Central aortic pressure: 130/62  Impression: 1. Unstable angina (class III) 2. Severe stenosis in the proximal to mid LAD, TIMI-2 flow into distal LAD. (This lesion was critical, proximal LAD so PCI was indicated).  3. Successful PTCA/DES x 1 proximal to mid LAD  Recommendations: He will need at least one year of dual anti-platelet therapy with ASA and Plavix. Continue other cardiac meds. Home in am if stable.        Complications:  None. The patient tolerated the procedure well.

## 2013-06-13 ENCOUNTER — Encounter (HOSPITAL_COMMUNITY): Payer: Self-pay | Admitting: Physician Assistant

## 2013-06-13 DIAGNOSIS — B029 Zoster without complications: Secondary | ICD-10-CM | POA: Diagnosis present

## 2013-06-13 DIAGNOSIS — I200 Unstable angina: Secondary | ICD-10-CM

## 2013-06-13 LAB — BASIC METABOLIC PANEL
BUN: 15 mg/dL (ref 6–23)
Chloride: 102 mEq/L (ref 96–112)
GFR calc Af Amer: >90 mL/min (ref >90)
Potassium: 4.2 mEq/L (ref 3.5–5.1)
Sodium: 139 mEq/L (ref 135–145)

## 2013-06-13 LAB — CBC
HCT: 39.1 % (ref 39.0–52.0)
Hemoglobin: 13.6 g/dL (ref 13.0–17.0)
MCH: 30.8 pg (ref 26.0–34.0)
Platelets: 268 10*3/uL (ref 150–400)
RDW: 12.6 % (ref 11.5–15.5)
WBC: 8.8 10*3/uL (ref 4.0–10.5)

## 2013-06-13 MED ORDER — METFORMIN HCL 500 MG PO TABS: 500.0000 mg | ORAL_TABLET | Freq: Every day | ORAL | Status: DC

## 2013-06-13 MED ORDER — ASPIRIN 81 MG PO TABS: 81.0000 mg | ORAL_TABLET | Freq: Every day | ORAL | Status: DC

## 2013-06-13 MED ORDER — VALACYCLOVIR HCL 1 G PO TABS: 1000.0000 mg | ORAL_TABLET | Freq: Three times a day (TID) | ORAL | Status: DC

## 2013-06-13 MED FILL — Sodium Chloride IV Soln 0.9%: INTRAVENOUS | Qty: 50 | Status: AC

## 2013-06-13 NOTE — Discharge Summary (Signed)
D/c home with wife

## 2013-06-13 NOTE — Progress Notes (Signed)
     SUBJECTIVE: No chest pain or SOB.   BP 139/77  Pulse 61  Temp(Src) 98 F (36.7 C) (Oral)  Resp 17  Ht 5\' 9"  (1.753 m)  Wt 178 lb (80.74 kg)  BMI 26.27 kg/m2  SpO2 96%  Intake/Output Summary (Last 24 hours) at 06/13/13 0754 Last data filed at 06/12/13 1800  Gross per 24 hour  Intake    850 ml  Output      0 ml  Net    850 ml    PHYSICAL EXAM General: Well developed, well nourished, in no acute distress. Alert and oriented x 3.  Psych:  Good affect, responds appropriately Neck: No JVD. No masses noted.  Lungs: Clear bilaterally with no wheezes or rhonci noted.  Heart: RRR with no murmurs noted. Abdomen: Bowel sounds are present. Soft, non-tender.  Extremities: No lower extremity edema.   LABS: Basic Metabolic Panel:  Recent Labs  16/10/96 0440  NA 139  K 4.2  CL 102  CO2 27  GLUCOSE 131*  BUN 15  CREATININE 0.86  CALCIUM 9.3   CBC:  Recent Labs  06/13/13 0440  WBC 8.8  HGB 13.6  HCT 39.1  MCV 88.7  PLT 268   Current Meds: . aspirin  325 mg Oral Daily  . clopidogrel  75 mg Oral Daily  . famotidine  20 mg Oral QHS  . isosorbide mononitrate  60 mg Oral Daily  . mometasone-formoterol  2 puff Inhalation BID  . montelukast  10 mg Oral QHS  . multivitamin with minerals  1 tablet Oral Daily  . pantoprazole  40 mg Oral QAC breakfast  . simvastatin  10 mg Oral q1800  . valACYclovir  1,000 mg Oral TID     ASSESSMENT AND PLAN:  1. CAD/Unstable angina: Pt admitted with unstable angina. Now s/p placement of DES in the distal RCA and the proximal LAD. Doing well. Continue ASA, Plavix, statin, Imdur. No beta blocker secondary to bradycardia.    2. Shingles: He will need to complete 10 day course of Valtrex 1 gram po TID. Today is day 4.   3. Dispo: D/C home today and f/u with Nishan in 2-3 weeks.   Phillip Jordan  10/21/20147:54 AM

## 2013-06-13 NOTE — Discharge Summary (Signed)
Discharge Summary   Patient ID: Phillip Jordan MRN: [id number], DOB/AGE: [birth year] 79 y.o. Admit date: 06/09/2013 D/C date:     06/13/2013  Primary Cardiologist: Dr. [name]  Primary Discharge Diagnoses:  1. CAD/unstable angina - s/p DES to distal RCA then staged DES to prox LAD this admission - prior history of stenting to the RCA and Cx - P2Y12 was adequate at 79 2. Shingles  Secondary Discharge Diagnoses:  1. AAA with distal dissection 2. Chronic RBBB 3. COPD with asthma 4. Diabetes mellitus 5. Hyperlipidemia 6. Allergic rhinitis  Hospital Course:Phillip Jordan is an 79 y/o M with history of CAD, AAA with distal dissection, COPD, and DM. He presented to [facility] 06/09/2013 for planned cardiac cath. He was recently seen in the office complaining of new onset angina for the last few months, occurring when he exerted himself such as cleaning the pool or cooking at church. EKG had shown SR 59bpm PR 224 with question of old IMI. Per Dr. [name], he reviewed abdominal US from 2/12 and Ao only 2.3 by 2.5 cm and distal disection not visible with no false lumen. Cardiac cath was recommended for concern for [place]. He underwent this on 06/09/13 and had severe stenosis of the distal RCA and prox LAD. The LAD was not ideal for LIMA and Dr. [name] felt that the dRCA was likely his culprit. That day he received PTCA/DES to the distal RCA. EF was 55-60%. He was kept over the weekend for post-cath hydration with plans for staged PCI to the LAD secondary to contrast load with initial cath. Renal function remained stable and he was brought back to the lab 06/12/13 for PTCA/DES x 1 proximal LAD (lesion extending from the proximal segment into the mid segment). DAPT for at least 1 yr was recommended. He tolerated these procedures well. P2Y12 was 66 indicating good Plavix responsiveness. He is not on beta blocker secondary to bradycardia. Of note, this admission the patient was also found to have  shingles. He had actually had the lesions for over a week. ID was curbsided and recommended Valtrex 1g PO TID for 10 days. Today is day 4/10. Pain medication was made available as inpatient but the patient did not require any this admission. He was instructed to follow up with his PCP to reassess shingles in a week. Dr. Clifton James has seen and examined the patient today and feels he is stable for discharge.  Discharge Vitals: Blood pressure 139/77, pulse 61, temperature 98 F (36.7 C), temperature source Oral, resp. rate 17, height 5\' 9"  (1.753 m), weight 178 lb (80.74 kg), SpO2 96.00%.  Labs: Lab Results  Component Value Date   WBC 8.8 06/13/2013   HGB 13.6 06/13/2013   HCT 39.1 06/13/2013   MCV 88.7 06/13/2013   PLT 268 06/13/2013     Recent Labs Lab 06/13/13 0440  NA 139  K 4.2  CL 102  CO2 27  BUN 15  CREATININE 0.86  CALCIUM 9.3  GLUCOSE 131*    Lab Results  Component Value Date   CHOL 158 12/16/2012   HDL 35.10* 12/16/2012   LDLCALC 95 12/16/2012   TRIG 139.0 12/16/2012     Diagnostic Studies/Procedures   Cardiac catheterization this admission, please see full report and above for summary. (x2)  Dg Chest 2 View 06/05/2013   CLINICAL DATA:  Chest pain with shortness of breath. Scheduled for cardiac catheterization 06/09/2013.  EXAM: CHEST  2 VIEW  COMPARISON:  Radiographs 01/05/2012 and 05/26/2011.  FINDINGS: The heart size and mediastinal contours are stable. There are suspected chronic nipple shadows bilaterally, unchanged. Mild fibrotic changes are present at both lung bases, unchanged. There is no edema, confluent airspace opacity or pleural effusion.  IMPRESSION: Stable fibrotic changes at both lung bases. No acute cardiopulmonary process.   Electronically Signed   By: Roxy Horseman M.D.   On: 06/05/2013 13:13    Discharge Medications     Medication List         acetaminophen 500 MG tablet  Commonly known as:  TYLENOL  Take 1,000 mg by mouth every 6 (six) hours  as needed for pain.     albuterol 108 (90 BASE) MCG/ACT inhaler  Commonly known as:  VENTOLIN HFA  Inhale 2 puffs into the lungs every 6 (six) hours as needed.     aspirin 81 MG tablet  Take 1 tablet (81 mg total) by mouth daily.     clopidogrel 75 MG tablet  Commonly known as:  PLAVIX  Take 1 tablet (75 mg total) by mouth daily.     DULERA 100-5 MCG/ACT Aero  Generic drug:  mometasone-formoterol  Inhale 2 puffs into the lungs 2 (two) times daily.     famotidine 20 MG tablet  Commonly known as:  PEPCID  Take 20 mg by mouth at bedtime.     isosorbide mononitrate 60 MG 24 hr tablet  Commonly known as:  IMDUR  Take 1 tablet (60 mg total) by mouth daily.     loratadine 10 MG tablet  Commonly known as:  CLARITIN  Take 10 mg by mouth daily as needed for allergies.     metFORMIN 500 MG tablet  Commonly known as:  GLUCOPHAGE  Take 1 tablet (500 mg total) by mouth daily with breakfast.  Start taking on:  06/15/2013     montelukast 10 MG tablet  Commonly known as:  SINGULAIR  Take 1 tablet (10 mg total) by mouth at bedtime.     multivitamin with minerals Tabs tablet  Take 1 tablet by mouth daily.     nitroGLYCERIN 0.4 MG SL tablet  Commonly known as:  NITROSTAT  Place 1 tablet (0.4 mg total) under the tongue every 5 (five) minutes as needed.     pantoprazole 40 MG tablet  Commonly known as:  PROTONIX  Take 1 tablet (40 mg total) by mouth daily. Take 30-60 min before first meal of the day     pravastatin 40 MG tablet  Commonly known as:  PRAVACHOL  Take 1 tablet (40 mg total) by mouth daily.     valACYclovir 1000 MG tablet  Commonly known as:  VALTREX  Take 1 tablet (1,000 mg total) by mouth 3 (three) times daily.        Disposition   The patient will be discharged in stable condition to home. Discharge Orders   Future Appointments Provider Department Dept Phone   06/26/2013 11:10 AM Kennon Rounds [facility] [place] Office [phone]   07/05/2013  11:00 AM [facility] [phone]   07/05/2013 12:00 PM Nyoka Cowden, MD [facility] [phone]   08/15/2013 2:45 PM Wanda Plump, MD [facility]  [place] [phone]   Future Orders Complete By Expires   Diet - low sodium heart healthy  As directed    Scheduling Instructions:     Diabetic Diet   Discharge instructions  As directed    Comments:     Do not  restart Metformin until the morning of 06/15/13.   Increase activity slowly  As directed    Comments:     No driving for 2 days. No lifting over 5 lbs for 1 week. No sexual activity for 1 week. Keep procedure site clean & dry. If you notice increased pain, swelling, bleeding or pus, call/return!  You may shower, but no soaking baths/hot tubs/pools for 1 week.     Follow-up Information   Follow up with Tereso Newcomer, PA-C. [facility] - 06/26/13 at 11am)    Specialty:  Physician Assistant   Contact information:   [address] [place] [address] [phone]       Follow up with [name], MD. (Follow up with primary care doctor in a week to recheck your shingles.)    Specialty:  Internal Medicine   Contact information:   [phone] [facility] [address] [place] [address] [phone]         Duration of Discharge Encounter: Greater than 30 minutes including physician and PA time.  Signed, Ronie Spies PA-C 06/13/2013, 9:32 AM

## 2013-06-13 NOTE — Discharge Summary (Signed)
See full note.cdm 

## 2013-06-13 NOTE — Progress Notes (Signed)
CARDIAC REHAB PHASE I   PRE:  Rate/Rhythm: 71SR  BP:  Supine:   Sitting: 158/63  Standing:    SaO2:   MODE:  Ambulation: 390 ft   POST:  Rate/Rhythm: 82 SR  BP:  Supine:   Sitting: 166/53  Standing:    SaO2:  0935-1027 Pt walked 390 ft with steady gait. Tolerated well. No CP. Education completed with pt and wife with understanding voiced. Gave written diet and exercise. Discussed CRP 2 and permission given to refer to [facility] Phase 2. Wife given insurance code to check coverage.    Luetta Nutting, RN BSN  06/13/2013 10:22 AM

## 2013-06-16 ENCOUNTER — Ambulatory Visit (INDEPENDENT_AMBULATORY_CARE_PROVIDER_SITE_OTHER): Payer: Medicare Other | Admitting: Internal Medicine

## 2013-06-16 ENCOUNTER — Encounter: Payer: Self-pay | Admitting: Internal Medicine

## 2013-06-16 VITALS — BP 166/68 | HR 74 | Temp 97.6°F | Wt 174.0 lb

## 2013-06-16 DIAGNOSIS — J449 Chronic obstructive pulmonary disease, unspecified: Secondary | ICD-10-CM

## 2013-06-16 DIAGNOSIS — I2510 Atherosclerotic heart disease of native coronary artery without angina pectoris: Secondary | ICD-10-CM

## 2013-06-16 DIAGNOSIS — E785 Hyperlipidemia, unspecified: Secondary | ICD-10-CM

## 2013-06-16 DIAGNOSIS — B029 Zoster without complications: Secondary | ICD-10-CM

## 2013-06-16 MED ORDER — CLOPIDOGREL BISULFATE 75 MG PO TABS: 75.0000 mg | ORAL_TABLET | Freq: Every day | ORAL | Status: DC

## 2013-06-16 NOTE — Assessment & Plan Note (Signed)
Developed shingles during recent admission, on Valtrex, at this point does not required anything but Tylenol as needed. Patient to call me if symptoms persist or increase

## 2013-06-16 NOTE — Assessment & Plan Note (Signed)
Doing well, samples provided

## 2013-06-16 NOTE — Assessment & Plan Note (Signed)
s/p DES to distal RCA then staged DES to prox LAD Few days ago, exertional chest pain resolved, no apparent complications

## 2013-06-16 NOTE — Progress Notes (Signed)
  Subjective:    Patient ID: Phillip Jordan, male    DOB: 09/21/1929, 79 y.o.   MRN: [id number]  HPI Hospital followup Was admitted for elective cardiac catheterization from 10-17 to 10-21, had 2 catheterizations and 2 procedures, tolerated them well. Admission was complicated by shingles in the chest, he is taking Valtrex. Labs were unremarkable, last BMP was okay. Medication list reviewed  Past Medical History  Diagnosis Date  . COPD with asthma   . CAD (coronary artery disease)     a. s/p MI 1992. b. Prior hx stenting to RCA/Cx. b. 05/2013: [place] s/p DES to distal RCA then staged DES to prox LAD.   . Diabetes mellitus   . Hyperlipidemia   . Allergic rhinitis   . Aortic dissection     f/u by cards - AAA with distal dissection.   Past Surgical History  Procedure Laterality Date  . Back surgery  1993  . Cardiac catheterization      left, w/ coronary angiography and left ventriculograpy  . Ptca      w/ placement of drugeluting stent in the distal right coronary artery, and distal Cardiologist: Loraine Leriche W.Pulsipher   History   Social History  . Marital Status: Married    Spouse Name: N/A    Number of Children: 3  . Years of Education: N/A   Occupational History  . retired    Social History Main Topics  . Smoking status: Former Smoker -- 1.50 packs/day for 39 years    Types: Cigarettes    Quit date: 08/24/1992  . Smokeless tobacco: Never Used  . Alcohol Use: No  . Drug Use: No  . Sexual Activity: Not on file   Other Topics Concern  . Not on file   Social History Narrative   Does mission work.   3 kids, 8 Gk              Review of Systems Since he left the hospital he is feeling well. Has been able to exercise without chest pain. Denies any Swelling or discharge from the sides of the conversation and they aren't ongoing. No back pain. Denies nausea, vomiting, diarrhea. Shingles rash has not expanded, still hurting, has Tylenol at home but has not needed to use  it despite rating  pain 6/10     Objective:   Physical Exam  Skin:      BP 166/68  Pulse 74  Temp(Src) 97.6 F (36.4 C)  Wt 174 lb (78.926 kg)  BMI 25.68 kg/m2  SpO2 97% General -- alert, well-developed, NAD.  Lungs -- normal respiratory effort, no intercostal retractions, no accessory muscle use, and normal breath sounds.  Heart-- normal rate, regular rhythm, no murmur.  Extremities-- no pretibial edema bilaterally  Neurologic--  alert & oriented X3. Speech normal, gait normal, strength normal in all extremities.  Psych-- Cognition and judgment appear intact. Cooperative with normal attention span and concentration. No anxious appearing , no depressed appearing.      Assessment & Plan:

## 2013-06-16 NOTE — Assessment & Plan Note (Signed)
Will check FLP on return to the office

## 2013-06-16 NOTE — Patient Instructions (Addendum)
Next visit in 3 months  for a physical exam  . Fasting Please make an appointment    Finish valtrex, call if the rash increases or if the pain from shingles does not improve or persists

## 2013-06-26 ENCOUNTER — Encounter: Payer: Self-pay | Admitting: Physician Assistant

## 2013-06-26 ENCOUNTER — Ambulatory Visit (INDEPENDENT_AMBULATORY_CARE_PROVIDER_SITE_OTHER): Payer: Medicare Other | Admitting: Physician Assistant

## 2013-06-26 VITALS — BP 114/70 | HR 60 | Ht 69.0 in | Wt 173.8 lb

## 2013-06-26 DIAGNOSIS — I2510 Atherosclerotic heart disease of native coronary artery without angina pectoris: Secondary | ICD-10-CM

## 2013-06-26 DIAGNOSIS — E785 Hyperlipidemia, unspecified: Secondary | ICD-10-CM

## 2013-06-26 DIAGNOSIS — I714 Abdominal aortic aneurysm, without rupture: Secondary | ICD-10-CM

## 2013-06-26 NOTE — Progress Notes (Signed)
[address], [address] [place], [place]  [address] Phone: [phone] Fax:  [phone]  Date:  06/26/2013   ID:  MERREL CRABBE, DOB July 20, 1930, MRN [id number]  PCP:  [name], MD  Cardiologist:  Dr. [name]     History of Present Illness: Phillip Jordan is a 79 y.o. male who has a history of CAD, status post prior PCI, small AAA with distal dissection, RBBB, COPD, T2DM, HL.  Last abdominal US (3/14): Infrarenal AAA 2.1 x 2.6 cm - follow up suggested in one year.  He was recently seen in the office with complaints of exertional angina (CCS class III).  He was set up for cardiac catheterization.  LHC (06/09/13): Mid LAD 90%, proximal D1 80%, mid D2 90%, circumflex with patent stent, RCA 90% between 2 stents, EF 55-65%.  LAD was not suitable for CABG. Multivessel PCI was recommended. PCI: Promus premier (3 x 20 mm) DES to the distal RCA.  Patient was kept in the hospital for IV hydration. He underwent staged PCI (06/12/13): Promus premier (2.5 x 16 mm) DES to the proximal LAD.  He is not on a beta blocker due to bradycardia. P2Y12 was 66 on Plavix. He was noted to have herpes zoster and he was placed on Valtrex for 10 days.    He is doing well since discharge from the hospital.  The patient denies chest pain, shortness of breath, syncope, orthopnea, PND or significant pedal edema.   Recent Labs: 12/16/2012: ALT 29; HDL 35.10*; LDL (calc) 95  06/13/2013: Creatinine 0.86; Hemoglobin 13.6; Potassium 4.2   Wt Readings from Last 3 Encounters:  06/26/13 173 lb 12.8 oz (78.835 kg)  06/16/13 174 lb (78.926 kg)  06/09/13 178 lb (80.74 kg)     Past Medical History  Diagnosis Date  . COPD with asthma   . CAD (coronary artery disease)     a. s/p MI 1992. b. Prior hx stenting to RCA/Cx. b. 05/2013: [place] s/p DES to distal RCA then staged DES to prox LAD.   . Diabetes mellitus   . Hyperlipidemia   . Allergic rhinitis   . Aortic dissection     f/u by cards - AAA with distal dissection.      Current Outpatient Prescriptions  Medication Sig Dispense Refill  . acetaminophen (TYLENOL) 500 MG tablet Take 1,000 mg by mouth every 6 (six) hours as needed for pain.      [name] albuterol (VENTOLIN HFA) 108 (90 BASE) MCG/ACT inhaler Inhale 2 puffs into the lungs every 6 (six) hours as needed.  18 g  3  . aspirin 81 MG tablet Take 1 tablet (81 mg total) by mouth daily.      . clopidogrel (PLAVIX) 75 MG tablet Take 1 tablet (75 mg total) by mouth daily.  90 tablet  3  . DULERA 100-5 MCG/ACT AERO Inhale 2 puffs into the lungs 2 (two) times daily.  3 Inhaler  3  . famotidine (PEPCID) 20 MG tablet Take 20 mg by mouth at bedtime.      . isosorbide mononitrate (IMDUR) 60 MG 24 hr tablet Take 1 tablet (60 mg total) by mouth daily.  90 tablet  1  . loratadine (CLARITIN) 10 MG tablet Take 10 mg by mouth daily as needed for allergies.       . metFORMIN (GLUCOPHAGE) 500 MG tablet Take 1 tablet (500 mg total) by mouth daily with breakfast.      . montelukast (SINGULAIR) 10 MG tablet  Take 1 tablet (10 mg total) by mouth at bedtime.  90 tablet  1  . Multiple Vitamin (MULTIVITAMIN WITH MINERALS) TABS tablet Take 1 tablet by mouth daily.      . nitroGLYCERIN (NITROSTAT) 0.4 MG SL tablet Place 1 tablet (0.4 mg total) under the tongue every 5 (five) minutes as needed.  25 tablet  4  . pantoprazole (PROTONIX) 40 MG tablet Take 1 tablet (40 mg total) by mouth daily. Take 30-60 min before first meal of the day  30 tablet  2  . pravastatin (PRAVACHOL) 40 MG tablet Take 1 tablet (40 mg total) by mouth daily.  90 tablet  3  . valACYclovir (VALTREX) 1000 MG tablet Take 1 tablet (1,000 mg total) by mouth 3 (three) times daily.  20 tablet  0   No current facility-administered medications for this visit.    Allergies:   Review of patient's allergies indicates no known allergies.   Social History:  The patient  reports that he quit smoking about 20 years ago. His smoking use included Cigarettes. He has a 58.5 pack-year  smoking history. He has never used smokeless tobacco. He reports that he does not drink alcohol or use illicit drugs.   Family History:  The patient's family history includes Asthma in his mother; Cancer in his mother; Heart disease in his father and mother. There is no history of Prostate cancer or Colon cancer.   ROS:  Please see the history of present illness.      All other systems reviewed and negative.   PHYSICAL EXAM: VS:  BP 114/70  Pulse 60  Ht 5\' 9"  (1.753 m)  Wt 173 lb 12.8 oz (78.835 kg)  BMI 25.65 kg/m2 Well nourished, well developed, in no acute distress HEENT: normal Neck: no JVD Cardiac:  normal S1, S2; RRR; no murmur Lungs:  clear to auscultation bilaterally, no wheezing, rhonchi or rales Abd: soft, nontender, no hepatomegaly Ext: no edema; right wrist without hematoma or mass, left groin without hematoma or bruit  Skin: warm and dry; faint rash noted left anterior chest Neuro:  CNs 2-12 intact, no focal abnormalities noted  EKG:  NSR, HR 60, first degree AV block PR interval 218 ms, RBBB, no change from prior tracing     ASSESSMENT AND PLAN:  1. CAD:  No angina.  Continue ASA, Plavix and statin.  He is looking into cardiac rehab.  2. Hyperlipidemia:  Continue statin. 3. Herpes Zoster:  Improved.   4. AAA:  F/u 10/2013. 5. Diabetes Mellitus:  F/u with PCP. 6. COPD:  F/u with pulmonary. 7. Disposition:  F/u with Dr. [name] in 2 mos.   Signed, Tereso Newcomer, PA-C  06/26/2013 11:31 AM

## 2013-06-26 NOTE — Patient Instructions (Addendum)
Your physician assistant recommends that you schedule a follow-up appointment in:  08/29/13 8 AM with Dr. [name].    NO CHANGES WERE MADE TODAY WITH YOUR MEDICATIONS

## 2013-07-05 ENCOUNTER — Ambulatory Visit (INDEPENDENT_AMBULATORY_CARE_PROVIDER_SITE_OTHER): Payer: Medicare Other | Admitting: Internal Medicine

## 2013-07-05 ENCOUNTER — Encounter: Payer: Self-pay | Admitting: Internal Medicine

## 2013-07-05 VITALS — BP 112/58 | HR 64 | Temp 97.4°F | Ht 68.0 in | Wt 176.0 lb

## 2013-07-05 DIAGNOSIS — J449 Chronic obstructive pulmonary disease, unspecified: Secondary | ICD-10-CM

## 2013-07-05 LAB — PULMONARY FUNCTION TEST

## 2013-07-05 MED ORDER — MOMETASONE FURO-FORMOTEROL FUM 200-5 MCG/ACT IN AERO: INHALATION_SPRAY | RESPIRATORY_TRACT | Status: DC

## 2013-07-05 NOTE — Patient Instructions (Addendum)
Increase dulera to 200 Take 2 puffs first thing in am and then another 2 puffs about 12 hours later.    Work on inhaler technique:  relax and gently blow all the way out then take a nice smooth deep breath back in, triggering the inhaler at same time you start breathing in.  Hold for up to 5 seconds if you can.  Rinse and gargle with water when done   If you like the dulera 200 better after 2 weeks fill the rx, otherwise go back to the 100 dose   Please schedule a follow up visit in 3 months but call sooner if needed

## 2013-07-05 NOTE — Progress Notes (Signed)
PFT done today. 

## 2013-07-05 NOTE — Progress Notes (Signed)
Subjective:    Patient ID: Phillip Jordan, male    DOB: 22-Jan-1930   MRN: [id number]  Brief patient profile:  79  year old male, ex-smoker (1ppd x 30 years, quit 1990s), diagnosed with COPD per hx by Dr Phillip Jordan (fev1 1.38L/44%, ratio 51 in Jan 2009). C/w GOLD III           05/25/2013 consult/ [name] re: sob/ prev eval MR with GOLD III COPD Chief Complaint  Patient presents with  . Pulmonary Consult    Referred per Dr. Willow Jordan. The pt c/o DOE for the past 15 years, but worse for the past 6 months. He states that he gets SOB with walking up hills, but does okay with his exercise program 2-3 times per wk. He also c/o hoarseness and cough "for a long time"- cough is prod in the am with minimal clear sputum.      All his days are about the same in terms of activity tolerance  Breathing got better p quit smoking then worse again starting around mid 1990s Better on dulera, no better on spiriva ? Worse hoarseness so stopped it Work on inhaler technique:    Pantoprazole (protonix) 40 mg   Take 30-60 min before first meal of the day and Pepcid 20 mg one bedtime until return to office - this is the best way to tell whether stomach acid is contributing to your problem.  GERD diet   07/05/2013 f/u ov/Wert re: COPD GOLD III Chief Complaint  Patient presents with  . Followup with PFT    Pt states that overall his SOB and cough have improved since the last visit. He denies any new co's today.    No longer needing much albuterol at all and able to tol desired activities s tendency to aecopd but still "not as good as I get on prednisone"  No obvious day to day or daytime variabilty or assoc chronic cough or cp or chest tightness, subjective wheeze overt sinus or hb symptoms. No unusual exp hx or h/o childhood pna/ asthma or knowledge of premature birth.    Also denies any obvious fluctuation of symptoms with weather or environmental changes or other aggravating or alleviating factors except as  outlined above   Current Medications, Allergies, Complete Past Medical History, Past Surgical History, Family History, and Social History were reviewed in Phillip Jordan record.  ROS  The following are not active complaints unless bolded sore throat, dysphagia, dental problems, itching, sneezing,  nasal congestion or excess/ purulent secretions, ear ache,   fever, chills, sweats, unintended wt loss, pleuritic or exertional cp, hemoptysis,  orthopnea pnd or leg swelling, presyncope, palpitations, heartburn, abdominal pain, anorexia, nausea, vomiting, diarrhea  or change in bowel or urinary habits, change in stools or urine, dysuria,hematuria,  rash, arthralgias, visual complaints, headache, numbness weakness or ataxia or problems with walking or coordination,  change in mood/affect or memory.          Objective:   Physical Exam  amb hoarse wm nad, somewhat congested cough nad  Wt Readings from Last 3 Encounters:  07/05/13 176 lb (79.833 kg)  06/26/13 173 lb 12.8 oz (78.835 kg)  06/16/13 174 lb (78.926 kg)         HEENT mild turbinate edema.  Oropharynx no thrush or excess pnd or cobblestoning.  No JVD or cervical adenopathy. Mild accessory muscle hypertrophy. Trachea midline, nl thryroid. Chest was hyperinflated by percussion with diminished breath sounds and moderate increased exp time without  wheeze. Hoover sign positive at mid inspiration. Regular rate and rhythm without murmur gallop or rub or increase P2 or edema.  Abd: no hsm, nl excursion. Ext warm without cyanosis or clubbing.        cxr 06/05/13  Stable fibrotic changes at both lung bases. No acute cardiopulmonary  process.   Assessment & Plan:

## 2013-07-07 NOTE — Assessment & Plan Note (Addendum)
-   PFT's 07/02/11  FEV1  1.41 (55%) ratio 66 and 12% better p B2 with DLCO 96% - PFTs  07/05/2013 FEV1  1.05 and 1.31 p B2 (24%) with DLCO 59 corrects to 84%  - Spiriva and other mdi result in hoarseness - hfa 05/25/2013 75% p coaching  - added gerd rx 05/25/2013 >> improved 07/05/13    Overall improved but reports prednisone works the best and note he has lost ground x 2 years in terms of FEV1 with lots of reversibility so if could tol dulera 200 2bid s worse hoarsness would be the best choice for now  The proper method of use, as well as anticipated side effects, of a metered-dose inhaler are discussed and demonstrated to the patient. Improved effectiveness after extensive coaching during this visit to a level of approximately  90%     Each maintenance medication was reviewed in detail including most importantly the difference between maintenance and as needed and under what circumstances the prns are to be used.  Please see instructions for details which were reviewed in writing and the patient given a copy.

## 2013-07-10 ENCOUNTER — Telehealth: Payer: Self-pay | Admitting: Internal Medicine

## 2013-07-10 DIAGNOSIS — J449 Chronic obstructive pulmonary disease, unspecified: Secondary | ICD-10-CM

## 2013-07-10 NOTE — Telephone Encounter (Signed)
I spoke with pt. He reports he needed to know if he was to continue on the pepcid and protonix. i advised him he did. He did not need refill at this time. Nothing further needed

## 2013-07-10 NOTE — Telephone Encounter (Signed)
Questionnaire copied below:    Phillip Jordan - Questionnaire Submission ','<More Detail >>          Questionnaire Submission  Phillip Jordan  MRN: [id number] DOB: 06-13-1930     Pt Home: [phone]     Sent: Wynelle Link July 09, 2013 7:27 PM   Entered: [phone]                           Current View: Showing all answers Show Only Relevant Answers    Legend: Scores, Non-relevant Questions     Patient Responses     Chl Mychart After Visit Questionnaire    Question 07/09/2013 7:27 PM   How are you feeling after your recent visit? very well   Does the recommended course of treatment seem to be helping your symptoms? some improvemeni   Are you experiencing any side effects from your recommended treatment? none   is there anything else you would like to ask your physician? Am I to continue the 2 rx you perscribed after the same expired?         Message    Patient Questionnaire Submission   --------------------------------      Questionnaire: Questionnaire      Question: How are you feeling after your recent visit?   Answer: very well      Question: Does the recommended course of treatment seem to be helping your symptoms?   Answer: some improvemeni      Question: Are you experiencing any side effects from your recommended treatment?   Answer: none      Question: is there anything else you would like to ask your physician?   Answer: Am I to continue the 2 rx you perscribed after the same expired?      ----- Message -----   From: Sandrea Hughs, MD   Sent: 07/07/2013 8:48 AM   To: Phillip Jordan   Subject: Questionnaire       To ensure we are providing you the highest quality healthcare, we'd like to know how you are feeling after your recent visit. At your earliest convenience, please complete the brief follow-up assessment by clicking the Task: Questionnaire link listed above.      Thank you for your time in helping [place] improve our  services and for partnering with [place] in your wellness and care.      Sincerely,      Your Care Team

## 2013-07-17 ENCOUNTER — Encounter: Payer: Self-pay | Admitting: Internal Medicine

## 2013-07-18 ENCOUNTER — Other Ambulatory Visit: Payer: Self-pay | Admitting: *Deleted

## 2013-07-18 MED ORDER — MONTELUKAST SODIUM 10 MG PO TABS: 10.0000 mg | ORAL_TABLET | Freq: Every day | ORAL | Status: DC

## 2013-07-18 NOTE — Telephone Encounter (Signed)
rx refilled per protocol. DJR  

## 2013-07-21 ENCOUNTER — Encounter: Payer: Self-pay | Admitting: Internal Medicine

## 2013-07-25 ENCOUNTER — Telehealth: Payer: Self-pay | Admitting: Internal Medicine

## 2013-07-25 NOTE — Telephone Encounter (Signed)
Pt requesting a sample of Dulera 200. Advised pt this would be left at the front for pick uo. Nothing further needed

## 2013-08-01 ENCOUNTER — Telehealth (HOSPITAL_COMMUNITY): Payer: Self-pay | Admitting: Cardiac Rehabilitation

## 2013-08-01 NOTE — Telephone Encounter (Signed)
Follow up pc to enroll pt in cardiac rehab.  Pt declined stating he is currently exercising on his own.

## 2013-08-07 ENCOUNTER — Ambulatory Visit (INDEPENDENT_AMBULATORY_CARE_PROVIDER_SITE_OTHER): Payer: Medicare Other | Admitting: Internal Medicine

## 2013-08-07 ENCOUNTER — Encounter: Payer: Self-pay | Admitting: Internal Medicine

## 2013-08-07 VITALS — BP 150/69 | HR 64 | Temp 97.5°F | Wt 176.2 lb

## 2013-08-07 DIAGNOSIS — J441 Chronic obstructive pulmonary disease with (acute) exacerbation: Secondary | ICD-10-CM

## 2013-08-07 MED ORDER — PREDNISONE 20 MG PO TABS: ORAL_TABLET | ORAL | Status: DC

## 2013-08-07 MED ORDER — DOXYCYCLINE HYCLATE 100 MG PO TABS: 100.0000 mg | ORAL_TABLET | Freq: Two times a day (BID) | ORAL | Status: DC

## 2013-08-07 NOTE — Progress Notes (Signed)
Pre visit review using our clinic review tool, if applicable. No additional management support is needed unless otherwise documented below in the visit note. 

## 2013-08-07 NOTE — Progress Notes (Signed)
   Subjective:    Patient ID: Phillip Jordan, male    DOB: 1930-01-31, 79 y.o.   MRN: [id number]  HPI   Symptoms began 08/04/13 his low-grade fever followed by NP cough, wheezing, and right temporal headache. He also had significant sneezing and white nasal discharge.  He feels like the symptoms have progressed despite using Coricidin and Tylenol.  He does have a history of asthmatic bronchitis/COPD and is followed by Dr.Wert, pulmonologist. He quit smoking in 1994    Review of Systems  He denies frontal headache, facial pain, purulent nasal discharge , otic pain, otic discharge  Sneezing is not been associated with itchy, watery eyes.  The fever was not associated with chills or sweats.  FBS low 90-120 high. BP @ home 130/65.     Objective:   Physical Exam General appearance:good health ;well nourished; no acute distress or increased work of breathing is present.  No  lymphadenopathy about the head, neck, or axilla noted. Appears younger than stated age  Eyes: No conjunctival inflammation or lid edema is present. There is no scleral icterus.  Ears:  External ear exam shows no significant lesions or deformities.  Otoscopic examination reveals some w/o tympanic membrane bulging, retraction, inflammation or discharge.  Nose:  External nasal examination shows no deformity or inflammation. Nasal mucosa are dry without lesions or exudates. No septal dislocation or deviation.No obstruction to airflow.   Oral exam: Dental hygiene is good; lips and gums are healthy appearing.There is no oropharyngeal erythema or exudate noted.   Neck:  No deformities,  masses, or tenderness noted.      Heart:  Normal rate and regular rhythm. S1 and S2 normal without gallop, murmur, click, rub or other extra sounds. Distant heart sounds  Lungs: Decreased breath sounds; with forced expiration some wheezing is noted on the right. Again there is no increased work of breathing.  Extremities:  No  cyanosis, edema, or clubbing  noted    Skin: Warm & dry          Assessment & Plan:  #1 COPD flare See orders

## 2013-08-07 NOTE — Patient Instructions (Signed)
Plain Mucinex (NOT D) for thick secretions ;force NON dairy fluids .   Nasal cleansing in the shower as discussed with lather of mild shampoo.After 10 seconds wash off lather while  exhaling through nostrils. Make sure that all residual soap is removed to prevent irritation.  Nasacort AQ OTC 1 spray in each nostril twice a day as needed. Use the "crossover" technique into opposite nostril spraying toward opposite ear @ 45 degree angle, not straight up into nostril.  Use a Neti pot daily only  as needed for significant sinus congestion; going from open side to congested side . Plain Allegra (NOT D )  160 daily OR Loratidine 10 mg   as needed for itchy eyes & sneezing.

## 2013-08-14 ENCOUNTER — Other Ambulatory Visit: Payer: Self-pay | Admitting: Internal Medicine

## 2013-08-14 ENCOUNTER — Telehealth: Payer: Self-pay | Admitting: Internal Medicine

## 2013-08-14 ENCOUNTER — Other Ambulatory Visit: Payer: Self-pay

## 2013-08-14 ENCOUNTER — Telehealth: Payer: Self-pay

## 2013-08-14 DIAGNOSIS — R059 Cough, unspecified: Secondary | ICD-10-CM

## 2013-08-14 DIAGNOSIS — R05 Cough: Secondary | ICD-10-CM

## 2013-08-14 MED ORDER — HYDROCODONE-HOMATROPINE 5-1.5 MG/5ML PO SYRP: 5.0000 mL | ORAL_SOLUTION | Freq: Four times a day (QID) | ORAL | Status: DC | PRN

## 2013-08-14 NOTE — Telephone Encounter (Signed)
[facility] received a faxed rx for HYDROcodone-homatropine (HYDROMET) 5-1.5 MG/5ML syrup. This must be brought in physically, per pharmacy. Cannot be faxed.

## 2013-08-14 NOTE — Telephone Encounter (Signed)
Faxed to pharmacy

## 2013-08-14 NOTE — Telephone Encounter (Signed)
Spoke with patient and advised that Rx will be at front desk. Placed in folder.

## 2013-08-15 ENCOUNTER — Ambulatory Visit: Payer: Medicare Other | Admitting: Internal Medicine

## 2013-08-18 ENCOUNTER — Ambulatory Visit: Payer: Medicare Other | Admitting: Internal Medicine

## 2013-08-18 ENCOUNTER — Encounter: Payer: Self-pay | Admitting: Internal Medicine

## 2013-08-21 ENCOUNTER — Emergency Department (HOSPITAL_COMMUNITY)
Admission: EM | Admit: 2013-08-21 | Discharge: 2013-08-21 | Disposition: A | Discharge status: Home / Self Care | Payer: Medicare Other | Source: Home / Self Care

## 2013-08-21 ENCOUNTER — Encounter: Payer: Self-pay | Admitting: Internal Medicine

## 2013-08-21 ENCOUNTER — Encounter (HOSPITAL_COMMUNITY): Payer: Self-pay | Admitting: Emergency Medicine

## 2013-08-21 DIAGNOSIS — J449 Chronic obstructive pulmonary disease, unspecified: Secondary | ICD-10-CM

## 2013-08-21 DIAGNOSIS — R05 Cough: Secondary | ICD-10-CM

## 2013-08-21 DIAGNOSIS — R0982 Postnasal drip: Secondary | ICD-10-CM

## 2013-08-21 NOTE — ED Provider Notes (Signed)
CSN: [id number]     Arrival date & time 08/21/13  1305 History   First MD Initiated Contact with Patient 08/21/13 1518     Chief Complaint  Patient presents with  . URI   (Consider location/radiation/quality/duration/timing/severity/associated sxs/prior Treatment) HPI Comments: 79 year old male complaining of a cough for 3 weeks. He saw his PCP about 2 weeks ago and was given Hydromet, prednisone and doxycycline. He finished these medicines. He states that even though it helped his cough some it really upset his stomach. He denies fever, chills, upper respiratory congestion. He does have a history of COPD.   Past Medical History  Diagnosis Date  . COPD with asthma   . CAD (coronary artery disease)     a. s/p MI 1992. b. Prior hx stenting to RCA/Cx. b. 05/2013: [place] s/p DES to distal RCA then staged DES to prox LAD.   . Diabetes mellitus   . Hyperlipidemia   . Allergic rhinitis   . Aortic dissection     f/u by cards - AAA with distal dissection.   Past Surgical History  Procedure Laterality Date  . Back surgery  1993  . Cardiac catheterization      left, w/ coronary angiography and left ventriculograpy  . Ptca      w/ placement of drugeluting stent in the distal right coronary artery, and distal Cardiologist: Loraine Leriche W.Pulsipher   Family History  Problem Relation Age of Onset  . Prostate cancer Neg Hx   . Colon cancer Neg Hx   . Asthma Mother   . Heart disease Mother     CHF  . Heart disease Father     MI  . Cancer Mother     melanoma   History  Substance Use Topics  . Smoking status: Former Smoker -- 1.50 packs/day for 39 years    Types: Cigarettes    Quit date: 08/24/1992  . Smokeless tobacco: Never Used  . Alcohol Use: No    Review of Systems  Constitutional: Positive for activity change. Negative for fever, diaphoresis and fatigue.  HENT: Positive for postnasal drip. Negative for ear pain, facial swelling, rhinorrhea, sore throat and trouble swallowing.   Eyes:  Negative for pain, discharge and redness.  Respiratory: Positive for cough. Negative for chest tightness and shortness of breath.   Cardiovascular: Negative.   Gastrointestinal: Negative.   Musculoskeletal: Negative.  Negative for neck pain and neck stiffness.  Neurological: Negative.     Allergies  Review of patient's allergies indicates no known allergies.  Home Medications   Current Outpatient Rx  Name  Route  Sig  Dispense  Refill  . acetaminophen (TYLENOL) 500 MG tablet   Oral   Take 1,000 mg by mouth every 6 (six) hours as needed for pain.         [name] albuterol (VENTOLIN HFA) 108 (90 BASE) MCG/ACT inhaler   Inhalation   Inhale 2 puffs into the lungs every 6 (six) hours as needed.   18 g   3   . aspirin 81 MG tablet   Oral   Take 1 tablet (81 mg total) by mouth daily.         . clopidogrel (PLAVIX) 75 MG tablet   Oral   Take 1 tablet (75 mg total) by mouth daily.   90 tablet   3   . doxycycline (VIBRA-TABS) 100 MG tablet   Oral   Take 1 tablet (100 mg total) by mouth 2 (two) times daily.   14 tablet  0   . famotidine (PEPCID) 20 MG tablet   Oral   Take 20 mg by mouth at bedtime.         [name] HYDROcodone-homatropine (HYDROMET) 5-1.5 MG/5ML syrup   Oral   Take 5 mLs by mouth every 6 (six) hours as needed for cough.   120 mL   0   . isosorbide mononitrate (IMDUR) 60 MG 24 hr tablet   Oral   Take 1 tablet (60 mg total) by mouth daily.   90 tablet   1   . loratadine (CLARITIN) 10 MG tablet   Oral   Take 10 mg by mouth daily as needed for allergies.          . metFORMIN (GLUCOPHAGE) 500 MG tablet   Oral   Take 1 tablet (500 mg total) by mouth daily with breakfast.         . mometasone-formoterol (DULERA) 200-5 MCG/ACT AERO      Take 2 puffs first thing in am and then another 2 puffs about 12 hours later.   1 Inhaler   11   . montelukast (SINGULAIR) 10 MG tablet   Oral   Take 1 tablet (10 mg total) by mouth at bedtime.   90 tablet   1    . Multiple Vitamin (MULTIVITAMIN WITH MINERALS) TABS tablet   Oral   Take 1 tablet by mouth daily.         . nitroGLYCERIN (NITROSTAT) 0.4 MG SL tablet   Sublingual   Place 1 tablet (0.4 mg total) under the tongue every 5 (five) minutes as needed.   25 tablet   4   . pantoprazole (PROTONIX) 40 MG tablet   Oral   Take 1 tablet (40 mg total) by mouth daily. Take 30-60 min before first meal of the day   30 tablet   2   . pravastatin (PRAVACHOL) 40 MG tablet   Oral   Take 1 tablet (40 mg total) by mouth daily.   90 tablet   3   . predniSONE (DELTASONE) 20 MG tablet      1/2 tid with meals   9 tablet   0   . valACYclovir (VALTREX) 1000 MG tablet                BP 157/75  Pulse 70  Temp(Src) 98.2 F (36.8 C) (Oral)  Resp 18  SpO2 95% Physical Exam  Nursing note and vitals reviewed. Constitutional: He is oriented to person, place, and time.  HENT:  Mouth/Throat: Oropharynx is clear and moist. No oropharyngeal exudate.  Oropharynx is moist and without erythema. Positive for clear PND.  Eyes: Conjunctivae and EOM are normal.  Neck: Normal range of motion. Neck supple.  Cardiovascular: Normal rate, regular rhythm and normal heart sounds.   Pulmonary/Chest: Effort normal. No respiratory distress.  With his usual tidal volume/normal respirations there are no wheezes. A forced expiration reveals mild coarseness bilaterally. Prolonged expiratory phase. Does not appear to be in any distress or having increased effort.  Musculoskeletal: Normal range of motion. He exhibits no edema.  Lymphadenopathy:    He has no cervical adenopathy.  Neurological: He is alert and oriented to person, place, and time.  Skin: Skin is warm and dry. No rash noted.  Psychiatric: He has a normal mood and affect.    ED Course  Procedures (including critical care time) Labs Review Labs Reviewed - No data to display Imaging Review No results found.    MDM  1. COPD (chronic  obstructive pulmonary disease)   2. Cough   3. PND (post-nasal drip)     Take 1/2 teaspoon of your Hydromet after eating to lessen GI distress. Start using her albuterol HFA 2 puffs every 6 hours when necessary, if developing chest discomfort stop using it and call your doctor For worsening, new symptoms, problems, fever 6 medical attention promptly.     Hayden Rasmussen, NP 08/21/13 1623

## 2013-08-21 NOTE — ED Notes (Signed)
Phillip Jordan, seen by Hayden Rasmussen, np prior to this nurse

## 2013-08-22 NOTE — ED Provider Notes (Signed)
Medical screening examination/treatment/procedure(s) were performed by non-physician practitioner and as supervising physician I was immediately available for consultation/collaboration.  Ramey Schiff, M.D.  Amye Grego C Florrie Ramires, MD 08/22/13 0023 

## 2013-08-22 NOTE — Telephone Encounter (Signed)
Patient was seen at [facility] urgent care.      KP

## 2013-08-29 ENCOUNTER — Encounter (INDEPENDENT_AMBULATORY_CARE_PROVIDER_SITE_OTHER): Payer: Managed Care, Other (non HMO)

## 2013-08-29 ENCOUNTER — Encounter: Payer: Self-pay | Admitting: Cardiovascular Disease

## 2013-08-29 ENCOUNTER — Ambulatory Visit (INDEPENDENT_AMBULATORY_CARE_PROVIDER_SITE_OTHER): Payer: Managed Care, Other (non HMO) | Admitting: Cardiovascular Disease

## 2013-08-29 ENCOUNTER — Encounter: Payer: Self-pay | Admitting: *Deleted

## 2013-08-29 VITALS — BP 150/60 | HR 62 | Ht 69.0 in | Wt 172.0 lb

## 2013-08-29 DIAGNOSIS — E11319 Type 2 diabetes mellitus with unspecified diabetic retinopathy without macular edema: Secondary | ICD-10-CM

## 2013-08-29 DIAGNOSIS — E1139 Type 2 diabetes mellitus with other diabetic ophthalmic complication: Secondary | ICD-10-CM

## 2013-08-29 DIAGNOSIS — I4510 Unspecified right bundle-branch block: Secondary | ICD-10-CM

## 2013-08-29 DIAGNOSIS — E785 Hyperlipidemia, unspecified: Secondary | ICD-10-CM

## 2013-08-29 DIAGNOSIS — I4949 Other premature depolarization: Secondary | ICD-10-CM

## 2013-08-29 DIAGNOSIS — I493 Ventricular premature depolarization: Secondary | ICD-10-CM

## 2013-08-29 DIAGNOSIS — R002 Palpitations: Secondary | ICD-10-CM | POA: Insufficient documentation

## 2013-08-29 DIAGNOSIS — I2510 Atherosclerotic heart disease of native coronary artery without angina pectoris: Secondary | ICD-10-CM

## 2013-08-29 NOTE — Progress Notes (Signed)
Patient ID: Phillip Jordan, male   DOB: 03-Oct-1929, 80 y.o.   MRN: [id number] 80 yo male with history of CAD who was admitted 10/14 with unstable angina. Found to have severe stenoses in the RCA and the proximal to mid LAD. The RCA stenosis was treated with a DES. The LAD lesion was felt to be critical. The LAD had TIME-2 flow and a 95-99% lesion with plaque starting in the proximal vessel and extending into the mid vessel. The PCI of the LAD was staged.. Since d/c doing well  Not having chest pain.  Compliant with meds including plavix.  Having palpitations or skips  No rapid consistant pulses  Worse at rest     ROS: Denies fever, malais, weight loss, blurry vision, decreased visual acuity, cough, sputum, SOB, hemoptysis, pleuritic pain, palpitaitons, heartburn, abdominal pain, melena, lower extremity edema, claudication, or rash.  All other systems reviewed and negative  General: Affect appropriate Healthy:  appears stated age 31: normal Neck supple with no adenopathy JVP normal no bruits no thyromegaly Lungs clear with no wheezing and good diaphragmatic motion Heart:  S1/S2 no murmur, no rub, gallop or click PMI normal Abdomen: benighn, BS positve, no tenderness, no AAA no bruit.  No HSM or HJR Distal pulses intact with no bruits No edema Neuro non-focal Skin warm and dry No muscular weakness   Current Outpatient Prescriptions  Medication Sig Dispense Refill  . acetaminophen (TYLENOL) 500 MG tablet Take 1,000 mg by mouth every 6 (six) hours as needed for pain.      [name] albuterol (VENTOLIN HFA) 108 (90 BASE) MCG/ACT inhaler Inhale 2 puffs into the lungs every 6 (six) hours as needed.  18 g  3  . aspirin 81 MG tablet Take 1 tablet (81 mg total) by mouth daily.      . clopidogrel (PLAVIX) 75 MG tablet Take 1 tablet (75 mg total) by mouth daily.  90 tablet  3  . famotidine (PEPCID) 20 MG tablet Take 20 mg by mouth at bedtime.      [name] HYDROcodone-homatropine (HYDROMET) 5-1.5 MG/5ML  syrup Take 5 mLs by mouth every 6 (six) hours as needed for cough.  120 mL  0  . isosorbide mononitrate (IMDUR) 60 MG 24 hr tablet Take 1 tablet (60 mg total) by mouth daily.  90 tablet  1  . loratadine (CLARITIN) 10 MG tablet Take 10 mg by mouth daily as needed for allergies.       . metFORMIN (GLUCOPHAGE) 500 MG tablet Take 1 tablet (500 mg total) by mouth daily with breakfast.      . mometasone-formoterol (DULERA) 200-5 MCG/ACT AERO Take 2 puffs first thing in am and then another 2 puffs about 12 hours later.  1 Inhaler  11  . montelukast (SINGULAIR) 10 MG tablet Take 1 tablet (10 mg total) by mouth at bedtime.  90 tablet  1  . Multiple Vitamin (MULTIVITAMIN WITH MINERALS) TABS tablet Take 1 tablet by mouth daily.      . nitroGLYCERIN (NITROSTAT) 0.4 MG SL tablet Place 1 tablet (0.4 mg total) under the tongue every 5 (five) minutes as needed.  25 tablet  4  . pantoprazole (PROTONIX) 40 MG tablet Take 1 tablet (40 mg total) by mouth daily. Take 30-60 min before first meal of the day  30 tablet  2  . pravastatin (PRAVACHOL) 40 MG tablet Take 1 tablet (40 mg total) by mouth daily.  90 tablet  3  . valACYclovir (VALTREX) 1000 MG tablet  No current facility-administered medications for this visit.    Allergies  Review of patient's allergies indicates no known allergies.  Electrocardiogram:  SR RBBB possible old IMI   Assessment and Plan

## 2013-08-29 NOTE — Assessment & Plan Note (Signed)
Cholesterol is at goal.  Continue current dose of statin and diet Rx.  No myalgias or side effects.  F/U  LFT's in 6 months. Lab Results  Component Value Date   [place] 95 12/16/2012

## 2013-08-29 NOTE — Assessment & Plan Note (Signed)
S/P DES to LAD and RCA 10/14  Continue ASA and Plavix for a year Has nitro

## 2013-08-29 NOTE — Assessment & Plan Note (Signed)
Discussed low carb diet.  Target hemoglobin A1c is 6.5 or less.  Continue current medications.  

## 2013-08-29 NOTE — Assessment & Plan Note (Signed)
Get 24 hr holter.  ? Frequent PVC;s  May need beta blocker  COPD but no active wheezing

## 2013-08-29 NOTE — Assessment & Plan Note (Signed)
Stable no high grade AV block or syncope ECG in a year

## 2013-08-29 NOTE — Progress Notes (Signed)
Patient ID: Phillip Jordan, male   DOB: 1930-01-10, 80 y.o.   MRN: [id number] E-Cardio 24 hour holter monitor applied to patient.

## 2013-08-29 NOTE — Patient Instructions (Signed)
Your physician wants you to follow-up in:    [place] will receive a reminder letter in the mail two months in advance. If you don't receive a letter, please call our office to schedule the follow-up appointment. Your physician recommends that you continue on your current medications as directed. Please refer to the Current Medication list given to you today. Your physician has recommended that you wear a holter monitor. Holter monitors are medical devices that record the heart's electrical activity. Doctors most often use these monitors to diagnose arrhythmias. Arrhythmias are problems with the speed or rhythm of the heartbeat. The monitor is a small, portable device. You can wear one while you do your normal daily activities. This is usually used to diagnose what is causing palpitations/syncope (passing out).

## 2013-09-06 ENCOUNTER — Other Ambulatory Visit: Payer: Self-pay | Admitting: *Deleted

## 2013-09-06 MED ORDER — OSELTAMIVIR PHOSPHATE 75 MG PO CAPS
75.0000 mg | ORAL_CAPSULE | Freq: Every day | ORAL | Status: DC
Start: 2013-09-06 — End: 2013-09-18

## 2013-09-07 ENCOUNTER — Encounter: Payer: Self-pay | Admitting: Internal Medicine

## 2013-09-07 ENCOUNTER — Other Ambulatory Visit: Payer: Self-pay | Admitting: Internal Medicine

## 2013-09-07 MED ORDER — FAMOTIDINE 20 MG PO TABS: 20.0000 mg | ORAL_TABLET | Freq: Every day | ORAL | Status: DC

## 2013-09-07 MED ORDER — PANTOPRAZOLE SODIUM 40 MG PO TBEC: 40.0000 mg | DELAYED_RELEASE_TABLET | Freq: Every day | ORAL | Status: DC

## 2013-09-12 ENCOUNTER — Ambulatory Visit (INDEPENDENT_AMBULATORY_CARE_PROVIDER_SITE_OTHER): Payer: Medicare HMO

## 2013-09-12 DIAGNOSIS — E1149 Type 2 diabetes mellitus with other diabetic neurological complication: Secondary | ICD-10-CM

## 2013-09-12 DIAGNOSIS — E1142 Type 2 diabetes mellitus with diabetic polyneuropathy: Secondary | ICD-10-CM

## 2013-09-12 DIAGNOSIS — E1140 Type 2 diabetes mellitus with diabetic neuropathy, unspecified: Secondary | ICD-10-CM

## 2013-09-12 NOTE — Patient Instructions (Signed)
Diabetes and Foot Care Diabetes may cause you to have problems because of poor blood supply (circulation) to your feet and legs. This may cause the skin on your feet to become thinner, break easier, and heal more slowly. Your skin may become dry, and the skin may peel and crack. You may also have nerve damage in your legs and feet causing decreased feeling in them. You may not notice minor injuries to your feet that could lead to infections or more serious problems. Taking care of your feet is one of the most important things you can do for yourself.  HOME CARE INSTRUCTIONS  Wear shoes at all times, even in the house. Do not go barefoot. Bare feet are easily injured.  Check your feet daily for blisters, cuts, and redness. If you cannot see the bottom of your feet, use a mirror or ask someone for help.  Wash your feet with warm water (do not use hot water) and mild soap. Then pat your feet and the areas between your toes until they are completely dry. Do not soak your feet as this can dry your skin.  Apply a moisturizing lotion or petroleum jelly (that does not contain alcohol and is unscented) to the skin on your feet and to dry, brittle toenails. Do not apply lotion between your toes.  Trim your toenails straight across. Do not dig under them or around the cuticle. File the edges of your nails with an emery board or nail file.  Do not cut corns or calluses or try to remove them with medicine.  Wear clean socks or stockings every day. Make sure they are not too tight. Do not wear knee-high stockings since they may decrease blood flow to your legs.  Wear shoes that fit properly and have enough cushioning. To break in new shoes, wear them for just a few hours a day. This prevents you from injuring your feet. Always look in your shoes before you put them on to be sure there are no objects inside.  Do not cross your legs. This may decrease the blood flow to your feet.  If you find a minor scrape,  cut, or break in the skin on your feet, keep it and the skin around it clean and dry. These areas may be cleansed with mild soap and water. Do not cleanse the area with peroxide, alcohol, or iodine.  When you remove an adhesive bandage, be sure not to damage the skin around it.  If you have a wound, look at it several times a day to make sure it is healing.  Do not use heating pads or hot water bottles. They may burn your skin. If you have lost feeling in your feet or legs, you may not know it is happening until it is too late.  Make sure your health care provider performs a complete foot exam at least annually or more often if you have foot problems. Report any cuts, sores, or bruises to your health care provider immediately. SEEK MEDICAL CARE IF:   You have an injury that is not healing.  You have cuts or breaks in the skin.  You have an ingrown nail.  You notice redness on your legs or feet.  You feel burning or tingling in your legs or feet.  You have pain or cramps in your legs and feet.  Your legs or feet are numb.  Your feet always feel cold. SEEK IMMEDIATE MEDICAL CARE IF:   There is increasing redness,   swelling, or pain in or around a wound.  There is a red line that goes up your leg.  Pus is coming from a wound.  You develop a fever or as directed by your health care provider.  You notice a bad smell coming from an ulcer or wound. Document Released: 08/07/2000 Document Revised: 04/12/2013 Document Reviewed: 01/17/2013 Exit[employer] Patient Information 2014 [employer].  

## 2013-09-12 NOTE — Progress Notes (Signed)
   Subjective:    Patient ID: Phillip Jordan, male    DOB: [birth year], 80 y.o.   MRN: [id number]  HPI Comments: "I need my feet checked"  Patient states that his PCP sent here for foot exam. Pt states that he doesn't have any real concerns other than some calluses plantar 1st and 5th MPJ right foot and a hammer toe 5th toe right foot. He did say that he had surgery on the 5th toe right foot. Its not bothersome. Certain shoes aggravate occasional.   Diabetes      Review of Systems  HENT: Positive for hearing loss.   Respiratory: Positive for shortness of breath and wheezing.   Cardiovascular: Positive for palpitations.  Hematological: Bruises/bleeds easily.  All other systems reviewed and are negative.       Objective:   Physical Exam Ask her status is intact with pedal pulses palpable DP and PT posterior were for bilateral Refill timed 3-4 seconds all digits there is neurologically epicritic and proprioceptive sensations diminished on Semmes Weinstein testing were for diabetic foot exam detailed. There is absent epicritic sensation Semmes Weinstein testing multiple spots patient has intact skin texture color turgor decreased hair growth is noted. [place] was last A1c was but October had stent cardiac stent surgery and was in the hospital for short-term with no residual sequelae noted has been doing well continues to have some paresthesias bilateral feet cold feet at night no history of ulceration and complications are noted. Biomechanical exam reveals rectus foot type hammering of digits 2 through 5 with some slight flexible contractures noted patient's plantigrade metatarsals 15 bilateral right more so than left with associated keratoses sub-5 right. No active ulcerations no history of ulceration. No current signs of infection patient has relatively normal gait and appropriate coming shoes.       Assessment & Plan:  Assessment this time his diabetes with early peripheral neuropathy  decreased sensations confirmed on Semmes Weinstein testing. From a vascular standpoint patient has intact pedal pulses palpable and good vascular supply. Again for diabetic standpoint does have some early risk factors with neuropathy however a vascular standpoint and orthopedic biomechanical standpoint no significant factors are noted suggest a 6-12 month long-term followup and monitoring of his neuropathy progress. Recommended taking multivitamins she currently is using and continued will continue to do so. Continue to monitor and taking care of his diabetes. Reappointed in 6-12 months for followup  Harriet Masson DPM

## 2013-09-13 ENCOUNTER — Telehealth: Payer: Self-pay | Admitting: *Deleted

## 2013-09-13 NOTE — Telephone Encounter (Signed)
PT AWARE OF MONITOR RESULTS PER   DR NISHAN  PAC'S  PVC'S NO  SIG  ARRHYTHMIA./CY

## 2013-09-14 ENCOUNTER — Telehealth: Payer: Self-pay

## 2013-09-14 NOTE — Telephone Encounter (Signed)
Medication List and allergies:  Updated and Reviewed  90 day supply/mail order: n/a Local prescriptions:  [facility] on [address]  Immunization due: UTD  A/P: No changes to personal, family or PSH Flu- 06/10/13 Tdap- 08/24/2005 PNA- 06/01/2012  Shingles- 10/13/2007 CCS- 12/04/2005- normal PSA- 04/23/2009- 0.79   To discuss with provider: Needs hard copies of prescriptions for [facility]'s 90 day supply.

## 2013-09-18 ENCOUNTER — Encounter: Payer: Self-pay | Admitting: Internal Medicine

## 2013-09-18 ENCOUNTER — Ambulatory Visit (INDEPENDENT_AMBULATORY_CARE_PROVIDER_SITE_OTHER): Payer: Managed Care, Other (non HMO) | Admitting: Internal Medicine

## 2013-09-18 VITALS — BP 123/68 | HR 75 | Temp 98.0°F | Wt 172.0 lb

## 2013-09-18 DIAGNOSIS — E11319 Type 2 diabetes mellitus with unspecified diabetic retinopathy without macular edema: Secondary | ICD-10-CM

## 2013-09-18 DIAGNOSIS — J449 Chronic obstructive pulmonary disease, unspecified: Secondary | ICD-10-CM

## 2013-09-18 DIAGNOSIS — E1139 Type 2 diabetes mellitus with other diabetic ophthalmic complication: Secondary | ICD-10-CM

## 2013-09-18 DIAGNOSIS — E785 Hyperlipidemia, unspecified: Secondary | ICD-10-CM

## 2013-09-18 DIAGNOSIS — J4489 Other specified chronic obstructive pulmonary disease: Secondary | ICD-10-CM

## 2013-09-18 DIAGNOSIS — Z0000 Encounter for general adult medical examination without abnormal findings: Secondary | ICD-10-CM

## 2013-09-18 LAB — LIPID PANEL
Cholesterol: 164 mg/dL (ref 0–200)
HDL: 39.5 mg/dL (ref >39.00)
LDL Cholesterol: 95 mg/dL (ref 0–99)
TRIGLYCERIDES: 149 mg/dL (ref 0.0–149.0)
Total CHOL/HDL Ratio: 4
VLDL: 29.8 mg/dL (ref 0.0–40.0)

## 2013-09-18 LAB — AST: AST: 18 U/L (ref 0–37)

## 2013-09-18 LAB — ALT: ALT: 20 U/L (ref 0–53)

## 2013-09-18 LAB — HEMOGLOBIN A1C: Hgb A1c MFr Bld: 6.7 % — ABNORMAL HIGH (ref 4.6–6.5)

## 2013-09-18 MED ORDER — MONTELUKAST SODIUM 10 MG PO TABS: 10.0000 mg | ORAL_TABLET | Freq: Every day | ORAL | Status: DC

## 2013-09-18 MED ORDER — METFORMIN HCL 500 MG PO TABS: 500.0000 mg | ORAL_TABLET | Freq: Every day | ORAL | Status: DC

## 2013-09-18 MED ORDER — CLOPIDOGREL BISULFATE 75 MG PO TABS: 75.0000 mg | ORAL_TABLET | Freq: Every day | ORAL | Status: DC

## 2013-09-18 MED ORDER — GLUCOSE BLOOD VI STRP: ORAL_STRIP | Status: DC

## 2013-09-18 MED ORDER — PRAVASTATIN SODIUM 40 MG PO TABS: 40.0000 mg | ORAL_TABLET | Freq: Every day | ORAL | Status: DC

## 2013-09-18 MED ORDER — PANTOPRAZOLE SODIUM 40 MG PO TBEC: 40.0000 mg | DELAYED_RELEASE_TABLET | Freq: Every day | ORAL | Status: DC

## 2013-09-18 MED ORDER — ISOSORBIDE MONONITRATE ER 60 MG PO TB24: 60.0000 mg | ORAL_TABLET | Freq: Every day | ORAL | Status: DC

## 2013-09-18 MED ORDER — FAMOTIDINE 20 MG PO TABS: 20.0000 mg | ORAL_TABLET | Freq: Every day | ORAL | Status: DC

## 2013-09-18 NOTE — Assessment & Plan Note (Signed)
Due for labs

## 2013-09-18 NOTE — Progress Notes (Signed)
Pre visit review using our clinic review tool, if applicable. No additional management support is needed unless otherwise documented below in the visit note. 

## 2013-09-18 NOTE — Progress Notes (Signed)
Subjective:    Patient ID: Phillip Jordan, male    DOB: 1930-01-02, 80 y.o.   MRN: [id number]  HPI  Here for Medicare AWV:  1.Risk factors based on Past M, S, F history: yes  2. Physical Activities: [employer] 3 times a week, active  3. Depression/mood: neg screening 4. Hearing: poor hearing , has hearing aids, sees the [place] or Dr Thornell Mule  5. ADL's: totally independent , still drives  6. Fall Risk: no recent falls. Prevention discussed  7. Home Safety: feels safe at home  8. Height, weight, &visual acuity:see VS, vision corrected, sees eye doctor yearly  9. Counseling: yes , see below  10.Labs ordered based on risk factors: yes  11.Referral Coordination: if needed  12.Care Plan: see A/P  13. Cognitive Assessment : cognition, memory, motor skills seem appropriate for age   in addition, we discussed the following issues COPD--good compliance with routine medicines, uses albuterol as needed Diabetes--good compliance with medications, nor recent ambulatory CBGs Was recently prescribed Tamiflu, he finished a prescription, fortunately had no  symptoms of the flu GERD--symptoms well controlled. CAD-- chart reviewed, see assessment and plan. Needs multiple refills Rash in the back? --- exam (-), does have dry skin, rec to use Aveeno  Past Medical History  Diagnosis Date  . COPD with asthma   . CAD (coronary artery disease)     a. s/p MI 1992. b. Prior hx stenting to RCA/Cx. b. 05/2013: [place] s/p DES to distal RCA then staged DES to prox LAD.   . Diabetes mellitus   . Hyperlipidemia   . Allergic rhinitis   . Aortic dissection     f/u by cards - AAA with distal dissection.  . Shingles    Past Surgical History  Procedure Laterality Date  . Back surgery  1993  . Cardiac catheterization      left, w/ coronary angiography and left ventriculograpy  . Ptca      w/ placement of drugeluting stent in the distal right coronary artery, and distal Cardiologist: [name]   History    Social History  . Marital Status: Married    Spouse Name: N/A    Number of Children: 3  . Years of Education: N/A   Occupational History  . retired    Social History Main Topics  . Smoking status: Former Smoker -- 1.50 packs/day for 39 years    Types: Cigarettes    Quit date: 08/24/1992  . Smokeless tobacco: Never Used  . Alcohol Use: No  . Drug Use: No  . Sexual Activity: Not on file   Other Topics Concern  . Not on file   Social History Narrative   Lives w/ wife, does local mission work  .   3 kids, 8 Gk                Family History  Problem Relation Age of Onset  . Prostate cancer Neg Hx   . Colon cancer Neg Hx   . Asthma Mother   . Heart disease Mother     CHF  . Heart disease Father     MI  . Cancer Mother     melanoma    Review of Systems  No  CP, SOB, lower extremity edema Denies  nausea, vomiting diarrhea Denies  blood in the stools (-)hemoptysis No dysuria, gross hematuria, difficulty urinating         Objective:   Physical Exam BP 123/68  Pulse 75  Temp(Src) 98  F (36.7 C)  Wt 172 lb (78.019 kg)  SpO2 94% General -- alert, well-developed, NAD.  Neck --no thyromegaly   Lungs -- normal respiratory effort, no intercostal retractions, no accessory muscle use, and normal breath sounds.  Heart-- normal rate, regular rhythm, no murmur.  Abdomen-- Not distended, good bowel sounds,soft, non-tender. Extremities-- no pretibial edema bilaterally  Neurologic--  alert & oriented X3. Speech normal, gait normal, strength normal in all extremities.  Psych-- Cognition and judgment appear intact. Cooperative with normal attention span and concentration. No anxious or depressed appearing.     Assessment & Plan:

## 2013-09-18 NOTE — Assessment & Plan Note (Signed)
Recently saw podiatry, had a thorough foot exam per patient. Plan: Continue metformin, Labs

## 2013-09-18 NOTE — Assessment & Plan Note (Addendum)
Good compliance w/ medication, doing well

## 2013-09-18 NOTE — Patient Instructions (Signed)
Get your blood work before you leave   Next visit is for routine check up regards diabetes, hypertension, cholesterol   in 4 months  No need to come back fasting Please make an appointment       Fall Prevention and [place] cause injuries and can affect all age groups. It is possible to use preventive measures to significantly decrease the likelihood of falls. There are many simple measures which can make your home safer and prevent falls. OUTDOORS  Repair cracks and edges of walkways and driveways.  Remove high doorway thresholds.  Trim shrubbery on the main path into your home.  Have good outside lighting.  Clear walkways of tools, rocks, debris, and clutter.  Check that handrails are not broken and are securely fastened. Both sides of steps should have handrails.  Have leaves, snow, and ice cleared regularly.  Use sand or salt on walkways during winter months.  In the garage, clean up grease or oil spills. BATHROOM  Install night lights.  Install grab bars by the toilet and in the tub and shower.  Use non-skid mats or decals in the tub or shower.  Place a plastic non-slip stool in the shower to sit on, if needed.  Keep floors dry and clean up all water on the floor immediately.  Remove soap buildup in the tub or shower on a regular basis.  Secure bath mats with non-slip, double-sided rug tape.  Remove throw rugs and tripping hazards from the floors. BEDROOMS  Install night lights.  Make sure a bedside light is easy to reach.  Do not use oversized bedding.  Keep a telephone by your bedside.  Have a firm chair with side arms to use for getting dressed.  Remove throw rugs and tripping hazards from the floor. KITCHEN  Keep handles on pots and pans turned toward the center of the stove. Use back burners when possible.  Clean up spills quickly and allow time for drying.  Avoid walking on wet floors.  Avoid hot utensils and knives.  Position  shelves so they are not too high or low.  Place commonly used objects within easy reach.  If necessary, use a sturdy step stool with a grab bar when reaching.  Keep electrical cables out of the way.  Do not use floor polish or wax that makes floors slippery. If you must use wax, use non-skid floor wax.  Remove throw rugs and tripping hazards from the floor. STAIRWAYS  Never leave objects on stairs.  Place handrails on both sides of stairways and use them. Fix any loose handrails. Make sure handrails on both sides of the stairways are as long as the stairs.  Check carpeting to make sure it is firmly attached along stairs. Make repairs to worn or loose carpet promptly.  Avoid placing throw rugs at the top or bottom of stairways, or properly secure the rug with carpet tape to prevent slippage. Get rid of throw rugs, if possible.  Have an electrician put in a light switch at the top and bottom of the stairs. OTHER FALL PREVENTION TIPS  Wear low-heel or rubber-soled shoes that are supportive and fit well. Wear closed toe shoes.  When using a stepladder, make sure it is fully opened and both spreaders are firmly locked. Do not climb a closed stepladder.  Add color or contrast paint or tape to grab bars and handrails in your home. Place contrasting color strips on first and last steps.  Learn and use mobility  aids as needed. Install an electrical emergency response system.  Turn on lights to avoid dark areas. Replace light bulbs that burn out immediately. Get light switches that glow.  Arrange furniture to create clear pathways. Keep furniture in the same place.  Firmly attach carpet with non-skid or double-sided tape.  Eliminate uneven floor surfaces.  Select a carpet pattern that does not visually hide the edge of steps.  Be aware of all pets. OTHER HOME SAFETY TIPS  Set the water temperature for 120 F (48.8 C).  Keep emergency numbers on or near the telephone.  Keep  smoke detectors on every level of the home and near sleeping areas. Document Released: 07/31/2002 Document Revised: 02/09/2012 Document Reviewed: 10/30/2011 [facility] Patient Information 2014 [place].

## 2013-09-18 NOTE — Assessment & Plan Note (Signed)
unstable angina 05-2013, treated , now asymptomatic. Plan is to control cardiovascular risk factors

## 2013-09-18 NOTE — Assessment & Plan Note (Addendum)
  Td 2007  pneumonia shot x 3 , last 05-2012 had shingle shot already Had a flu    Cscope 2007, TICS, no polyps (Dr Sharlett Iles). Next Cscope...per GI  prostate cancer screening-- we agreed to stop screenings, see previous notes In the past, he has a slightly low testosterone,   bone density test was normal (06-2011) Continue with his healthy lifestyle

## 2013-09-19 ENCOUNTER — Telehealth: Payer: Self-pay | Admitting: Internal Medicine

## 2013-09-19 LAB — MICROALBUMIN / CREATININE URINE RATIO
Creatinine,U: 60.5 mg/dL
MICROALB/CREAT RATIO: 2.2 mg/g (ref 0.0–30.0)
Microalb, Ur: 1.3 mg/dL (ref 0.0–1.9)

## 2013-09-19 NOTE — Telephone Encounter (Signed)
Relevant patient education assigned to patient using Emmi. ° °

## 2013-09-21 ENCOUNTER — Telehealth: Payer: Self-pay

## 2013-09-21 DIAGNOSIS — E119 Type 2 diabetes mellitus without complications: Secondary | ICD-10-CM

## 2013-09-21 MED ORDER — METFORMIN HCL 500 MG PO TABS: 500.0000 mg | ORAL_TABLET | Freq: Two times a day (BID) | ORAL | Status: DC

## 2013-09-21 NOTE — Progress Notes (Signed)
Patient notified. See phone notes

## 2013-09-21 NOTE — Telephone Encounter (Signed)
Relevant patient education assigned to patient using Emmi. ° °

## 2013-09-21 NOTE — Telephone Encounter (Signed)
Patient called to check his lab work. Advised per recommendations. Patient voices back plan. Will print a new paper Rx for him to pick up.  Rx placed at front desk.

## 2013-09-25 ENCOUNTER — Encounter: Payer: Self-pay | Admitting: Internal Medicine

## 2013-09-25 ENCOUNTER — Encounter: Payer: Self-pay | Admitting: *Deleted

## 2013-10-03 ENCOUNTER — Ambulatory Visit (INDEPENDENT_AMBULATORY_CARE_PROVIDER_SITE_OTHER): Payer: Managed Care, Other (non HMO) | Admitting: Internal Medicine

## 2013-10-03 ENCOUNTER — Encounter: Payer: Self-pay | Admitting: Internal Medicine

## 2013-10-03 VITALS — BP 138/70 | HR 69 | Temp 97.3°F | Ht 69.0 in | Wt 175.2 lb

## 2013-10-03 DIAGNOSIS — J449 Chronic obstructive pulmonary disease, unspecified: Secondary | ICD-10-CM

## 2013-10-03 DIAGNOSIS — J309 Allergic rhinitis, unspecified: Secondary | ICD-10-CM

## 2013-10-03 NOTE — Patient Instructions (Addendum)
Continue dulera 200 Take 2 puffs first thing in am and then another 2 puffs about 12 hours later.    Only use your albuterol (proair/RED) as a rescue medication to be used if you can't catch your breath by resting or doing a relaxed purse lip breathing pattern.  - The less you use it, the better it will work when you need it. - Ok to use up to 2 puffs  every 4 hours if you must but call for immediate appointment if use goes up over your usual need - Don't leave home without it !!  (think of it like the spare tire for your car)   If hoarseness worsen you will need a spacer device   If hoarseness ok then you can try to wean the acid suppression per Dr [name]   Try clariton for the dripping nose.   If you are satisfied with your treatment plan let your doctor know and he/she can either refill your medications or you can return here when your prescription runs out.     If in any way you are not 100% satisfied,  please tell us.  If 100% better, tell your friends!

## 2013-10-03 NOTE — Assessment & Plan Note (Addendum)
-   PFT's 07/02/11  FEV1  1.41 (55%) ratio 66 and 12% better p B2 with DLCO 96% - PFTs  07/05/2013 FEV1  1.05 and 1.31 p B2 (24%) with DLCO 59 corrects to 84%  - Spiriva and other mdi result in hoarseness - hfa 05/25/2013 75% p coaching>  90% 07/05/13  - added gerd rx 05/25/2013 >> improved 07/05/13  - rx dulera 200 2bid 07/05/13 > better  Does not keep saba handy  The proper method of use, as well as anticipated side effects, of a metered-dose inhaler are discussed and demonstrated to the patient. Improved effectiveness after extensive coaching during this visit to a level of approximately  90% from baseline of 50%     Each maintenance medication was reviewed in detail including most importantly the difference between maintenance and as needed and under what circumstances the prns are to be used.  Please see instructions for details which were reviewed in writing and the patient given a copy.

## 2013-10-03 NOTE — Progress Notes (Signed)
Subjective:    Patient ID: Phillip Jordan, male    DOB: [birth year]   MRN: [id number]  Brief patient profile:  80  year old male, ex-smoker (1ppd x 30 years, quit 1990s), diagnosed with COPD per hx by Dr Phillip Jordan (fev1 1.38L/44%, ratio 51 in Jan 2009). C/w GOLD III           05/25/2013 consult/ [name] re: sob/ prev eval MR with GOLD III COPD Chief Complaint  Patient presents with  . Pulmonary Consult    Referred per Dr. Kathlene Jordan. The pt c/o DOE for the past 15 years, but worse for the past 6 months. He states that he gets SOB with walking up hills, but does okay with his exercise program 2-3 times per wk. He also c/o hoarseness and cough "for a long time"- cough is prod in the am with minimal clear sputum.      All his days are about the same in terms of activity tolerance  Breathing got better p quit smoking then worse again starting around mid 1990s Better on dulera, no better on spiriva ? Worse hoarseness so stopped it Work on inhaler technique:    Pantoprazole (protonix) 40 mg   Take 30-60 min before first meal of the day and Pepcid 20 mg one bedtime until return to office - this is the best way to tell whether stomach acid is contributing to your problem.  GERD diet   07/05/2013 f/u ov/Phillip Jordan re: COPD GOLD III Chief Complaint  Patient presents with  . Followup with PFT    Pt states that overall his SOB and cough have improved since the last visit. He denies any new co's today.    No longer needing much albuterol at all and able to tol desired activities s tendency to aecopd but still "not as good as I get on prednisone" rec Increase dulera to 200 Take 2 puffs first thing in am and then another 2 puffs about 12 hours later.  Work on inhaler technique:  If you like the dulera 200 better after 2 weeks fill the rx> done   10/03/2013 f/u ov/Phillip Jordan re: COPD GOLD III Chief Complaint  Patient presents with  . Follow-up    Breathing is fine. dry cough d/t PND.   cough tends to be early  in am but does not wake him up  Has clariton not using at all  Not limited from desired activities by breathing.    No obvious day to day or daytime variabilty or assoc chronic cough or cp or chest tightness, subjective wheeze overt sinus or hb symptoms. No unusual exp hx or h/o childhood pna/ asthma or knowledge of premature birth.    Also denies any obvious fluctuation of symptoms with weather or environmental changes or other aggravating or alleviating factors except as outlined above   Current Medications, Allergies, Complete Past Medical History, Past Surgical History, Family History, and Social History were reviewed in [employer] record.  ROS  The following are not active complaints unless bolded sore throat, dysphagia, dental problems, itching, sneezing,  nasal congestion or excess/ purulent secretions, ear ache,   fever, chills, sweats, unintended wt loss, pleuritic or exertional cp, hemoptysis,  orthopnea pnd or leg swelling, presyncope, palpitations, heartburn, abdominal pain, anorexia, nausea, vomiting, diarrhea  or change in bowel or urinary habits, change in stools or urine, dysuria,hematuria,  rash, arthralgias, visual complaints, headache, numbness weakness or ataxia or problems with walking or coordination,  change in mood/affect or  memory.          Objective:   Physical Exam  amb robust wm nad   10/03/2013        175  Wt Readings from Last 3 Encounters:  07/05/13 176 lb (79.833 kg)  06/26/13 173 lb 12.8 oz (78.835 kg)  06/16/13 174 lb (78.926 kg)         HEENT mild turbinate edema.  Oropharynx no thrush or excess pnd or cobblestoning.  No JVD or cervical adenopathy. Mild accessory muscle hypertrophy. Trachea midline, nl thryroid. Chest was hyperinflated by percussion with diminished breath sounds and moderate increased exp time without wheeze. Hoover sign positive at mid inspiration. Regular rate and rhythm without murmur gallop or rub or increase  P2 or edema.  Abd: no hsm, nl excursion. Ext warm without cyanosis or clubbing.        cxr 06/05/13  Stable fibrotic changes at both lung bases. No acute cardiopulmonary  process.   Assessment & Plan:

## 2013-10-04 NOTE — Assessment & Plan Note (Addendum)
Continue maint rx with singulair  Reviewed use of prn clariton

## 2013-12-04 ENCOUNTER — Other Ambulatory Visit (HOSPITAL_COMMUNITY): Payer: Self-pay | Admitting: Cardiology

## 2013-12-04 DIAGNOSIS — I714 Abdominal aortic aneurysm, without rupture, unspecified: Secondary | ICD-10-CM

## 2013-12-05 ENCOUNTER — Encounter: Payer: Self-pay | Admitting: Internal Medicine

## 2013-12-08 ENCOUNTER — Telehealth: Payer: Self-pay | Admitting: Internal Medicine

## 2013-12-08 NOTE — Telephone Encounter (Signed)
Called spoke with spouse. Aware no samples at this time. Nothing further needed

## 2013-12-12 ENCOUNTER — Telehealth: Payer: Self-pay | Admitting: Internal Medicine

## 2013-12-12 ENCOUNTER — Ambulatory Visit (HOSPITAL_COMMUNITY)
Admission: RE | Admit: 2013-12-12 | Discharge: 2013-12-12 | Disposition: A | Discharge status: Home / Self Care | Payer: Medicare HMO | Source: Ambulatory Visit | Attending: Internal Medicine | Admitting: Internal Medicine

## 2013-12-12 ENCOUNTER — Ambulatory Visit (INDEPENDENT_AMBULATORY_CARE_PROVIDER_SITE_OTHER): Payer: Managed Care, Other (non HMO) | Admitting: Internal Medicine

## 2013-12-12 ENCOUNTER — Encounter: Payer: Self-pay | Admitting: Internal Medicine

## 2013-12-12 ENCOUNTER — Ambulatory Visit (HOSPITAL_BASED_OUTPATIENT_CLINIC_OR_DEPARTMENT_OTHER): Payer: Medicare HMO | Admitting: Cardiology

## 2013-12-12 VITALS — BP 128/65 | HR 65 | Temp 97.9°F | Wt 176.0 lb

## 2013-12-12 DIAGNOSIS — I714 Abdominal aortic aneurysm, without rupture, unspecified: Secondary | ICD-10-CM

## 2013-12-12 DIAGNOSIS — W19XXXA Unspecified fall, initial encounter: Secondary | ICD-10-CM | POA: Insufficient documentation

## 2013-12-12 DIAGNOSIS — S20219A Contusion of unspecified front wall of thorax, initial encounter: Secondary | ICD-10-CM

## 2013-12-12 DIAGNOSIS — R079 Chest pain, unspecified: Secondary | ICD-10-CM | POA: Insufficient documentation

## 2013-12-12 DIAGNOSIS — S2239XA Fracture of one rib, unspecified side, initial encounter for closed fracture: Secondary | ICD-10-CM | POA: Insufficient documentation

## 2013-12-12 MED ORDER — MOMETASONE FURO-FORMOTEROL FUM 200-5 MCG/ACT IN AERO: INHALATION_SPRAY | RESPIRATORY_TRACT | Status: DC

## 2013-12-12 NOTE — Progress Notes (Signed)
Pre visit review using our clinic review tool, if applicable. No additional management support is needed unless otherwise documented below in the visit note. 

## 2013-12-12 NOTE — Patient Instructions (Signed)
Get the XR at [facility], corner of [address] and [address] (10 minutes form here); they are open 24/7 [place], [place] [address] [phone]   Tylenol  500 mg OTC 2 tabs a day every 8 hours as needed for pain  Call if the pain is not gradually improving

## 2013-12-12 NOTE — Progress Notes (Signed)
Subjective:    Patient ID: Phillip Jordan, male    DOB: 30-Jul-1930, 80 y.o.   MRN: [id number]  DOS:  12/12/2013 Type of  visit: Acute visit 2 weeks ago he was going down 3 steps, had an accidental fall, landed on his right side. No syncope, no loss of consciousness, no head injury. Since then having pain at the right side of the chest, worse when he sneezes or if he moved his right arm.   ROS Denies neck pain or back pain No cough   Past Medical History  Diagnosis Date  . COPD with asthma   . CAD (coronary artery disease)     a. s/p MI 1992. b. Prior hx stenting to RCA/Cx. b. 05/2013: [place] s/p DES to distal RCA then staged DES to prox LAD.   . Diabetes mellitus   . Hyperlipidemia   . Allergic rhinitis   . Aortic dissection     f/u by cards - AAA with distal dissection.  . Shingles     Past Surgical History  Procedure Laterality Date  . Back surgery  1993  . Cardiac catheterization      left, w/ coronary angiography and left ventriculograpy  . Ptca      w/ placement of drugeluting stent in the distal right coronary artery, and distal Cardiologist: [name]    History   Social History  . Marital Status: Married    Spouse Name: N/A    Number of Children: 3  . Years of Education: N/A   Occupational History  . retired    Social History Main Topics  . Smoking status: Former Smoker -- 1.50 packs/day for 39 years    Types: Cigarettes    Quit date: 08/24/1992  . Smokeless tobacco: Never Used  . Alcohol Use: No  . Drug Use: No  . Sexual Activity: Not on file   Other Topics Concern  . Not on file   Social History Narrative   Lives w/ wife, does local mission work  .   3 kids, 8 Gk                     Medication List       This list is accurate as of: 12/12/13 10:41 PM.  Always use your most recent med list.               acetaminophen 500 MG tablet  Commonly known as:  TYLENOL  Take 1,000 mg by mouth every 6 (six) hours as needed for  pain.     albuterol 108 (90 BASE) MCG/ACT inhaler  Commonly known as:  VENTOLIN HFA  Inhale 2 puffs into the lungs every 6 (six) hours as needed.     aspirin 81 MG tablet  Take 1 tablet (81 mg total) by mouth daily.     clopidogrel 75 MG tablet  Commonly known as:  PLAVIX  Take 1 tablet (75 mg total) by mouth daily.     glucose blood test strip  Commonly known as:  FREESTYLE TEST STRIPS  Use as instructed     isosorbide mononitrate 60 MG 24 hr tablet  Commonly known as:  IMDUR  Take 60 mg by mouth 2 (two) times daily.     loratadine 10 MG tablet  Commonly known as:  CLARITIN  Take 10 mg by mouth daily as needed for allergies.     metFORMIN 500 MG tablet  Commonly known as:  GLUCOPHAGE  Take 1  tablet (500 mg total) by mouth 2 (two) times daily with a meal.     mometasone-formoterol 200-5 MCG/ACT Aero  Commonly known as:  DULERA  Take 2 puffs first thing in am and then another 2 puffs about 12 hours later.     montelukast 10 MG tablet  Commonly known as:  SINGULAIR  Take 1 tablet (10 mg total) by mouth at bedtime.     multivitamin with minerals Tabs tablet  Take 1 tablet by mouth daily.     nitroGLYCERIN 0.4 MG SL tablet  Commonly known as:  NITROSTAT  Place 1 tablet (0.4 mg total) under the tongue every 5 (five) minutes as needed.     pravastatin 40 MG tablet  Commonly known as:  PRAVACHOL  Take 1 tablet (40 mg total) by mouth daily.           Objective:   Physical Exam  Abdominal:     BP 128/65  Pulse 65  Temp(Src) 97.9 F (36.6 C)  Wt 176 lb (79.833 kg)  SpO2 98% General -- alert, well-developed, NAD.   Lungs -- normal respiratory effort, no intercostal retractions, no accessory muscle use, and normal breath sounds.  Heart-- normal rate, regular rhythm, no murmur.  Chest wall -- see graphic  Extremities-- no pretibial edema bilaterally  ; shoulder with normal range of motion on the right Psych-- Cognition and judgment appear intact. Cooperative  with normal attention span and concentration. No anxious or depressed appearing.       Assessment & Plan:    Chest contusion, recommend Tylenol, we'll get a chest x-ray to be sure there is no fracture.

## 2013-12-12 NOTE — Telephone Encounter (Signed)
Called and spoke with pts wife and she is aware of sample of the dulera that has been left up front.  Nothing further is needed.

## 2013-12-12 NOTE — Progress Notes (Signed)
Aorta duplex complete 

## 2013-12-20 ENCOUNTER — Telehealth: Payer: Self-pay | Admitting: Internal Medicine

## 2013-12-20 ENCOUNTER — Telehealth: Payer: Self-pay | Admitting: Cardiovascular Disease

## 2013-12-20 ENCOUNTER — Telehealth: Payer: Self-pay | Admitting: Pulmonary Disease

## 2013-12-20 MED ORDER — MOMETASONE FURO-FORMOTEROL FUM 200-5 MCG/ACT IN AERO: 2.0000 | INHALATION_SPRAY | Freq: Two times a day (BID) | RESPIRATORY_TRACT | Status: DC

## 2013-12-20 NOTE — Telephone Encounter (Signed)
PT AWARE  OF  AAA RESULTS./CY 

## 2013-12-20 NOTE — Telephone Encounter (Signed)
Error wrong md

## 2013-12-20 NOTE — Telephone Encounter (Signed)
Spoke with patient's wife-they have already applied for patient assistance from [employer] and papers are being mailed here for MW to sign and send back. Will forward message to [place] to be on the lookout for these papers.   Also, patient needs sample in the meantime. I have placed one sample of Dulera 200/5 inhaler at front for pick up-documented in EPIC as well.

## 2013-12-20 NOTE — Telephone Encounter (Signed)
New message ° ° ° ° °Returning Christine's call °

## 2013-12-27 NOTE — Telephone Encounter (Signed)
LMTCB-nee to let patients wife know that we still have not gotten any Pt Assistance papers for the patient. Does she have a phone number she can call and let the company know? Or we call and find out the status of paperwork? Thanks.

## 2013-12-27 NOTE — Telephone Encounter (Signed)
I still have not received anything on this pt

## 2013-12-27 NOTE — Telephone Encounter (Signed)
Please advise Magda Paganini if these forms have been received/completed. Thanks.

## 2013-12-28 NOTE — Telephone Encounter (Signed)
lmomtcb x 2  

## 2013-12-29 NOTE — Telephone Encounter (Signed)
lmomtcb x 3  

## 2014-01-01 NOTE — Telephone Encounter (Addendum)
I called [employer] to see if they somehow received the forms and they did not. I have left message onhome and cell # for pt and spouse to see if they have a copy of the forms they mailed to [place] because these have not been received. Barlow Bing, CMA

## 2014-01-01 NOTE — Telephone Encounter (Signed)
I spoke with the pt spouse and she states they do have a copy of the forms. She states they are out of town but will bring the forms by next week when they return to town. [place] Bing, CMA

## 2014-01-03 ENCOUNTER — Telehealth: Payer: Self-pay | Admitting: Internal Medicine

## 2014-01-03 NOTE — Telephone Encounter (Signed)
Called spoke with Patsy. She reports [employer] is going to fax [place] to get help with [facility] today. I advised will make leslie aware to look out for this.

## 2014-01-03 NOTE — Telephone Encounter (Signed)
Noted and will address once received

## 2014-01-10 ENCOUNTER — Telehealth: Payer: Self-pay | Admitting: Pulmonary Disease

## 2014-01-10 NOTE — Telephone Encounter (Signed)
Pt's spouse is calling back.  Pt states that the [place] forms have already been completed & nothing is needed at this time.  Phillip Jordan

## 2014-01-10 NOTE — Telephone Encounter (Signed)
Spoke with the pt's spouse  She states that the pt is still needing forms signed from [place] for [facility]  I still have not received anything  She states that it was supposed to have been mailed here  I advised to have them fax it to the up front fax and put it to [employer]  She verbalized understanding  I will hold in my basket and let her know if we dont receive this soon

## 2014-01-17 ENCOUNTER — Ambulatory Visit (INDEPENDENT_AMBULATORY_CARE_PROVIDER_SITE_OTHER): Payer: Medicare HMO | Admitting: Internal Medicine

## 2014-01-17 ENCOUNTER — Encounter: Payer: Self-pay | Admitting: Internal Medicine

## 2014-01-17 VITALS — BP 101/56 | HR 65 | Temp 98.2°F | Wt 173.0 lb

## 2014-01-17 DIAGNOSIS — S20219A Contusion of unspecified front wall of thorax, initial encounter: Secondary | ICD-10-CM

## 2014-01-17 DIAGNOSIS — E11319 Type 2 diabetes mellitus with unspecified diabetic retinopathy without macular edema: Secondary | ICD-10-CM

## 2014-01-17 DIAGNOSIS — E1139 Type 2 diabetes mellitus with other diabetic ophthalmic complication: Secondary | ICD-10-CM

## 2014-01-17 DIAGNOSIS — E785 Hyperlipidemia, unspecified: Secondary | ICD-10-CM

## 2014-01-17 DIAGNOSIS — J449 Chronic obstructive pulmonary disease, unspecified: Secondary | ICD-10-CM

## 2014-01-17 NOTE — Progress Notes (Signed)
Subjective:    Patient ID: Phillip Jordan, male    DOB: [birth year], 80 y.o.   MRN: [id number]  DOS:  01/17/2014 Type of  Visit: routine office visit Was recently seen with a chest wall contusion, much improved. Diabetes, ambulatory blood sugars around 101 COPD, good compliance with medications, Ruthe Mannan is quite expensive but he's trying to get some assistance This morning felt that his right ear was clogged, no ear pain or ear discharge Med list reviewed, good compliance. Labs reviewed, due for a BMP, LFT, A1c   ROS Denies any recent chest pain or extremity edema No nausea, vomiting, diarrhea or blood in the stools. No frequent cough, sputum production or hemoptysis  Past Medical History  Diagnosis Date  . COPD with asthma   . CAD (coronary artery disease)     a. s/p MI 1992. b. Prior hx stenting to RCA/Cx. b. 05/2013: [place] s/p DES to distal RCA then staged DES to prox LAD.   . Diabetes mellitus   . Hyperlipidemia   . Allergic rhinitis   . Aortic dissection     f/u by cards - AAA with distal dissection.  . Shingles     Past Surgical History  Procedure Laterality Date  . Back surgery  1993  . Cardiac catheterization      left, w/ coronary angiography and left ventriculograpy  . Ptca      w/ placement of drugeluting stent in the distal right coronary artery, and distal Cardiologist: [name]    History   Social History  . Marital Status: Married    Spouse Name: N/A    Number of Children: 3  . Years of Education: N/A   Occupational History  . retired    Social History Main Topics  . Smoking status: Former Smoker -- 1.50 packs/day for 39 years    Types: Cigarettes    Quit date: 08/24/1992  . Smokeless tobacco: Never Used  . Alcohol Use: No  . Drug Use: No  . Sexual Activity: Not on file   Other Topics Concern  . Not on file   Social History Narrative   Lives w/ wife, does local mission work  .   3 kids, 8 Gk                     Medication  List       This list is accurate as of: 01/17/14  5:28 PM.  Always use your most recent med list.               acetaminophen 500 MG tablet  Commonly known as:  TYLENOL  Take 1,000 mg by mouth every 6 (six) hours as needed for pain.     albuterol 108 (90 BASE) MCG/ACT inhaler  Commonly known as:  VENTOLIN HFA  Inhale 2 puffs into the lungs every 6 (six) hours as needed.     aspirin 81 MG tablet  Take 1 tablet (81 mg total) by mouth daily.     clopidogrel 75 MG tablet  Commonly known as:  PLAVIX  Take 1 tablet (75 mg total) by mouth daily.     glucose blood test strip  Commonly known as:  FREESTYLE TEST STRIPS  Use as instructed     isosorbide mononitrate 60 MG 24 hr tablet  Commonly known as:  IMDUR  Take 60 mg by mouth 2 (two) times daily.     loratadine 10 MG tablet  Commonly known as:  CLARITIN  Take 10 mg by mouth daily as needed for allergies.     metFORMIN 500 MG tablet  Commonly known as:  GLUCOPHAGE  Take 1 tablet (500 mg total) by mouth 2 (two) times daily with a meal.     mometasone-formoterol 200-5 MCG/ACT Aero  Commonly known as:  DULERA  Inhale 2 puffs into the lungs 2 (two) times daily.     montelukast 10 MG tablet  Commonly known as:  SINGULAIR  Take 1 tablet (10 mg total) by mouth at bedtime.     multivitamin with minerals Tabs tablet  Take 1 tablet by mouth daily.     nitroGLYCERIN 0.4 MG SL tablet  Commonly known as:  NITROSTAT  Place 1 tablet (0.4 mg total) under the tongue every 5 (five) minutes as needed.     pravastatin 40 MG tablet  Commonly known as:  PRAVACHOL  Take 1 tablet (40 mg total) by mouth daily.           Objective:   Physical Exam BP 101/56  Pulse 65  Temp(Src) 98.2 F (36.8 C)  Wt 173 lb (78.472 kg)  SpO2 98% General -- alert, well-developed, NAD.   HEENT-- Not pale.  Minimal amount of wax in the canal, TMs wnl Lungs -- normal respiratory effort, no intercostal retractions, no accessory muscle use, and normal  breath sounds.  Heart-- normal rate, regular rhythm, no murmur.   Extremities-- no pretibial edema bilaterally  Neurologic--  alert & oriented X3. Speech normal, gait normal, strength normal in all extremities.  Psych-- Cognition and judgment appear intact. Cooperative with normal attention span and concentration. No anxious or depressed appearing.     Assessment & Plan:    Chest contusion, essentially resolved Mild wax accumulation in the ear, recommend peroxide, will call if not improving

## 2014-01-17 NOTE — Progress Notes (Signed)
Pre visit review using our clinic review tool, if applicable. No additional management support is needed unless otherwise documented below in the visit note. 

## 2014-01-17 NOTE — Assessment & Plan Note (Signed)
Check LFTs 

## 2014-01-17 NOTE — Patient Instructions (Signed)
Get your blood work before you leave   Use PEROXIDE  : put 3-4 drops in the right ear at night, call if the problem persist  Next visit is for routine check up in 5 months  fasting Please make an appointment

## 2014-01-17 NOTE — Assessment & Plan Note (Signed)
Under excellent control on current medications, Phillip Jordan is quite expensive but is trying to get some assistance

## 2014-01-17 NOTE — Assessment & Plan Note (Addendum)
Due for a A1c. Again discussed feet care w/ the patient, he is not checking his feet at night, encouraged to do so

## 2014-01-18 ENCOUNTER — Telehealth: Payer: Self-pay

## 2014-01-18 LAB — BASIC METABOLIC PANEL
BUN: 14 mg/dL (ref 6–23)
CO2: 27 mEq/L (ref 19–32)
Calcium: 9.4 mg/dL (ref 8.4–10.5)
Chloride: 100 mEq/L (ref 96–112)
Creatinine, Ser: 1 mg/dL (ref 0.4–1.5)
GFR: 78.81 mL/min (ref >60.00)
GLUCOSE: 88 mg/dL (ref 70–99)
POTASSIUM: 4.8 meq/L (ref 3.5–5.1)
Sodium: 136 mEq/L (ref 135–145)

## 2014-01-18 LAB — HEMOGLOBIN A1C: HEMOGLOBIN A1C: 6.4 % (ref 4.6–6.5)

## 2014-01-18 LAB — AST: AST: 23 U/L (ref 0–37)

## 2014-01-18 LAB — ALT: ALT: 20 U/L (ref 0–53)

## 2014-01-18 NOTE — Telephone Encounter (Signed)
Relevant patient education assigned to patient using Emmi. ° °

## 2014-01-23 ENCOUNTER — Telehealth: Payer: Self-pay | Admitting: Internal Medicine

## 2014-01-23 MED ORDER — MOMETASONE FURO-FORMOTEROL FUM 200-5 MCG/ACT IN AERO: 2.0000 | INHALATION_SPRAY | Freq: Two times a day (BID) | RESPIRATORY_TRACT | Status: DC

## 2014-01-23 NOTE — Telephone Encounter (Signed)
Called spoke with Phillip Jordan. Aware 1 sample was left for pick up. Nothing further needed

## 2014-03-01 ENCOUNTER — Other Ambulatory Visit: Payer: Self-pay | Admitting: *Deleted

## 2014-03-01 MED ORDER — METFORMIN HCL 500 MG PO TABS: 500.0000 mg | ORAL_TABLET | Freq: Two times a day (BID) | ORAL | Status: DC

## 2014-03-02 MED ORDER — METFORMIN HCL 500 MG PO TABS: 500.0000 mg | ORAL_TABLET | Freq: Two times a day (BID) | ORAL | Status: DC

## 2014-03-02 NOTE — Addendum Note (Signed)
Addended by: Peggyann Shoals on: 03/02/2014 08:32 AM   Modules accepted: Orders

## 2014-03-04 ENCOUNTER — Other Ambulatory Visit: Payer: Self-pay | Admitting: Internal Medicine

## 2014-03-13 ENCOUNTER — Ambulatory Visit: Payer: Medicare HMO

## 2014-03-27 ENCOUNTER — Ambulatory Visit (INDEPENDENT_AMBULATORY_CARE_PROVIDER_SITE_OTHER): Payer: Medicare HMO

## 2014-03-27 VITALS — BP 135/71 | HR 72 | Resp 17

## 2014-03-27 DIAGNOSIS — E1149 Type 2 diabetes mellitus with other diabetic neurological complication: Secondary | ICD-10-CM

## 2014-03-27 DIAGNOSIS — M79609 Pain in unspecified limb: Secondary | ICD-10-CM

## 2014-03-27 DIAGNOSIS — E1142 Type 2 diabetes mellitus with diabetic polyneuropathy: Secondary | ICD-10-CM

## 2014-03-27 DIAGNOSIS — E1140 Type 2 diabetes mellitus with diabetic neuropathy, unspecified: Secondary | ICD-10-CM

## 2014-03-27 NOTE — Progress Notes (Signed)
   Subjective:    Patient ID: Phillip Jordan, male    DOB: 05/30/1930, 80 y.o.   MRN: [id number]  HPI Pt presents for diabetic foot check up, he c/o occasional bilateral great toe pain and numbness, noticed pain is intermittent over last year. No other issues at this time    Review of Systems no new findings or systemic changes in the last 6 months.     Objective:   Physical Exam 80 year old white male well-developed well-nourished. History presents this time for followup diabetic foot exam. Pedal pulses are palpable DP and PT +2/4 bilateral capillary refill timed 3-4 seconds epicritic sensation diminished on Semmes Weinstein testing to the plantar forefoot sub-first MTP area dorsal foot and left ankle area. Patient does have some decreased sensation is confirmed with Thornell Mule there is normal plantar response DTRs were not elicited this time. Neurologically skin color pigment normal hair growth present posse slightly diminished open wounds ulcerations no secondary infections mild digital contractures a with HD 5 noted bilateral. Muscle strengths range of motion exercises all unremarkable.       Assessment & Plan:  Assessment this time his diabetes with early peripheral neuropathy in minimal complication noted patient's diabetes. The well-managed is no murmurs last A1c our panel is physician is happy with his management. Suggested followup in 12 months for long-term diabetic foot exam patient is doing his own foot care nail debridement as needed. Followup as instructed 12 months  Harriet Masson DPM

## 2014-03-27 NOTE — Patient Instructions (Signed)
Diabetes and Foot Care Diabetes may cause you to have problems because of poor blood supply (circulation) to your feet and legs. This may cause the skin on your feet to become thinner, break easier, and heal more slowly. Your skin may become dry, and the skin may peel and crack. You may also have nerve damage in your legs and feet causing decreased feeling in them. You may not notice minor injuries to your feet that could lead to infections or more serious problems. Taking care of your feet is one of the most important things you can do for yourself.  HOME CARE INSTRUCTIONS  Wear shoes at all times, even in the house. Do not go barefoot. Bare feet are easily injured.  Check your feet daily for blisters, cuts, and redness. If you cannot see the bottom of your feet, use a mirror or ask someone for help.  Wash your feet with warm water (do not use hot water) and mild soap. Then pat your feet and the areas between your toes until they are completely dry. Do not soak your feet as this can dry your skin.  Apply a moisturizing lotion or petroleum jelly (that does not contain alcohol and is unscented) to the skin on your feet and to dry, brittle toenails. Do not apply lotion between your toes.  Trim your toenails straight across. Do not dig under them or around the cuticle. File the edges of your nails with an emery board or nail file.  Do not cut corns or calluses or try to remove them with medicine.  Wear clean socks or stockings every day. Make sure they are not too tight. Do not wear knee-high stockings since they may decrease blood flow to your legs.  Wear shoes that fit properly and have enough cushioning. To break in new shoes, wear them for just a few hours a day. This prevents you from injuring your feet. Always look in your shoes before you put them on to be sure there are no objects inside.  Do not cross your legs. This may decrease the blood flow to your feet.  If you find a minor scrape,  cut, or break in the skin on your feet, keep it and the skin around it clean and dry. These areas may be cleansed with mild soap and water. Do not cleanse the area with peroxide, alcohol, or iodine.  When you remove an adhesive bandage, be sure not to damage the skin around it.  If you have a wound, look at it several times a day to make sure it is healing.  Do not use heating pads or hot water bottles. They may burn your skin. If you have lost feeling in your feet or legs, you may not know it is happening until it is too late.  Make sure your health care provider performs a complete foot exam at least annually or more often if you have foot problems. Report any cuts, sores, or bruises to your health care provider immediately. SEEK MEDICAL CARE IF:   You have an injury that is not healing.  You have cuts or breaks in the skin.  You have an ingrown nail.  You notice redness on your legs or feet.  You feel burning or tingling in your legs or feet.  You have pain or cramps in your legs and feet.  Your legs or feet are numb.  Your feet always feel cold. SEEK IMMEDIATE MEDICAL CARE IF:   There is increasing redness,   swelling, or pain in or around a wound.  There is a red line that goes up your leg.  Pus is coming from a wound.  You develop a fever or as directed by your health care provider.  You notice a bad smell coming from an ulcer or wound. Document Released: 08/07/2000 Document Revised: 04/12/2013 Document Reviewed: 01/17/2013 Exit[employer] Patient Information 2015 [employer]. This information is not intended to replace advice given to you by your health care provider. Make sure you discuss any questions you have with your health care provider.  

## 2014-03-28 ENCOUNTER — Encounter: Payer: Self-pay | Admitting: Medical

## 2014-03-28 ENCOUNTER — Ambulatory Visit (INDEPENDENT_AMBULATORY_CARE_PROVIDER_SITE_OTHER): Payer: Medicare HMO | Admitting: Medical

## 2014-03-28 VITALS — BP 120/64 | HR 70 | Temp 98.0°F | Wt 177.0 lb

## 2014-03-28 DIAGNOSIS — H6120 Impacted cerumen, unspecified ear: Secondary | ICD-10-CM

## 2014-03-28 DIAGNOSIS — H6123 Impacted cerumen, bilateral: Secondary | ICD-10-CM

## 2014-03-28 DIAGNOSIS — H6690 Otitis media, unspecified, unspecified ear: Secondary | ICD-10-CM | POA: Insufficient documentation

## 2014-03-28 DIAGNOSIS — H6692 Otitis media, unspecified, left ear: Secondary | ICD-10-CM

## 2014-03-28 NOTE — Patient Instructions (Signed)
By the  Lt ear exam you do have red tympanic membrane but undetermined severity of redness due to moderate to severe wax. Therefore I will send cefdinir antibiotic to your pharmacy and advise you use debrox otc to both ears to soften the wax. After medication has taken effect then come back in 7 days for ear lavage on both sides. Would not recommend lavage today based on appearance of tm today.

## 2014-03-28 NOTE — Assessment & Plan Note (Signed)
Not definitive due to amount of wax and only small portion of tm seen but will put on antibiotic. Follow up in 1 wk.

## 2014-03-28 NOTE — Progress Notes (Signed)
Pre visit review using our clinic review tool, if applicable. No additional management support is needed unless otherwise documented below in the visit note. 

## 2014-03-28 NOTE — Assessment & Plan Note (Signed)
Debrox otc over next week while taking the antibiotic for likely lt om. Then can lavage wax out in 7 days.

## 2014-03-28 NOTE — Progress Notes (Signed)
   Subjective:    Patient ID: Phillip Jordan, male    DOB: 17-Jun-1930, 80 y.o.   MRN: [id number]  HPI  Pt went to the audiologist yesterday since he felt like he could not hear. Pt states about 1 wk ago tried to wash out with some debrox type product. Yesterday audiologist told him he might have ear infection. Pt does not report chronic om. No fever, no chills, no Upper respiratory infection or allergy symptoms.    Review of Systems  Constitutional: Negative for fever, chills and fatigue.  HENT: Negative for congestion, ear pain, postnasal drip, sinus pressure, sneezing and tinnitus.        Lt tm red with known cerumen impaction.(Seen by audiologist and concern for OM)  Respiratory: Negative for apnea, cough, shortness of breath and wheezing.   Cardiovascular: Negative for chest pain and palpitations.  Neurological: Negative.   Hematological: Negative.        Objective:   Physical Exam  General  Mental Status - Alert. General Appearance - Well groomed. Not in acute distress.  Skin Rashes- No Rashes.  HEENT Head- Normal. Ear Auditory Canal - Left- moderate-severe wax obstuction Right - Moderate wax obstruction.Tympanic Membrane- Left- small portion seen but bright red. Right- Normal. Eye Sclera/Conjunctiva- Left- Normal. Right- Normal. Nose & Sinuses Nasal Mucosa- Left- Not Boggy or Congested. Right- Not Boggy or Congested. Mouth & Throat Lips: Upper Lip- Normal: no dryness, cracking, pallor, cyanosis, or vesicular eruption. Lower Lip-Normal: no dryness, cracking, pallor, cyanosis or vesicular eruption. Buccal Mucosa- Bilateral- No Aphthous ulcers. Oropharynx- No Discharge or Erythema. Tonsils: Characteristics- Bilateral- No Erythema or Congestion. Size/Enlargement- Bilateral- No enlargement. Discharge- bilateral-None.  Neck Neck- Supple. No Masses.   Chest and Lung Exam Auscultation: Breath Sounds:-Normal.  Cardiovascular Auscultation:Rythm- Regular.  Murmurs &  Other Heart Sounds:Ausculatation of the heart reveal- No Murmurs.  Lymphatic Head & Neck General Head & Neck Lymphatics: Bilateral: Description- No Localized lymphadenopathy.        Assessment & Plan:

## 2014-03-29 ENCOUNTER — Ambulatory Visit: Payer: Medicare HMO | Admitting: Medical

## 2014-03-29 MED ORDER — CEFDINIR 300 MG PO CAPS: 300.0000 mg | ORAL_CAPSULE | Freq: Two times a day (BID) | ORAL | Status: DC

## 2014-03-29 NOTE — Addendum Note (Signed)
Addended by: Peggyann Shoals on: 03/29/2014 09:26 AM   Modules accepted: Orders

## 2014-04-17 ENCOUNTER — Ambulatory Visit (INDEPENDENT_AMBULATORY_CARE_PROVIDER_SITE_OTHER): Payer: Medicare HMO | Admitting: Medical

## 2014-04-17 ENCOUNTER — Encounter: Payer: Self-pay | Admitting: Medical

## 2014-04-17 VITALS — BP 125/60 | HR 60 | Temp 97.9°F | Wt 175.0 lb

## 2014-04-17 DIAGNOSIS — H6120 Impacted cerumen, unspecified ear: Secondary | ICD-10-CM

## 2014-04-17 DIAGNOSIS — H6123 Impacted cerumen, bilateral: Secondary | ICD-10-CM

## 2014-04-17 NOTE — Patient Instructions (Signed)
Your cerumen has cleared with curette and lavage today. For future you could use debrox for reoccurrence. Follow up as needed. Or as regularly scheduled with pcp.

## 2014-04-17 NOTE — Progress Notes (Signed)
   Subjective:    Patient ID: Phillip Jordan, male    DOB: 10/06/1929, 80 y.o.   MRN: [id number]  HPI   Pt in for ear lavage. See last note. He had possible om and placed on antibiotics while he used debrox. Then advised to come back for lavage. He reports no pain in ear. No fever. No uri or allergic rhinitis signs or symptoms.    Review of Systems  Constitutional: Negative for fever, chills and fatigue.  HENT: Negative.        Only both ears feel clogged.  Eyes: Negative for pain and redness.  Respiratory: Negative for cough, chest tightness and wheezing.   Cardiovascular: Negative for chest pain and palpitations.  Neurological: Negative.   Hematological: Negative.  Negative for adenopathy. Does not bruise/bleed easily.       Objective:   Physical Exam  Constitutional: He is oriented to person, place, and time. He appears well-developed and well-nourished. No distress.  HENT:  Head: Normocephalic and atraumatic.  Bilateral cerumen impaction.  Eyes: Conjunctivae and EOM are normal. Pupils are equal, round, and reactive to light.  Cardiovascular: Normal rate, regular rhythm and normal heart sounds.  Exam reveals no gallop and no friction rub.   No murmur heard. Pulmonary/Chest: Effort normal and breath sounds normal. No respiratory distress. He has no wheezes. He has no rales. He exhibits no tenderness.  Neurological: He is alert and oriented to person, place, and time.  Skin: He is not diaphoretic.  Psychiatric: He has a normal mood and affect. His behavior is normal. Judgment and thought content normal.           Assessment & Plan:

## 2014-04-17 NOTE — Assessment & Plan Note (Addendum)
I removed majority of wax today with curette. The remainder of wax removed lavaged out.

## 2014-05-30 LAB — HM DIABETES EYE EXAM

## 2014-06-08 ENCOUNTER — Other Ambulatory Visit: Payer: Self-pay

## 2014-06-19 ENCOUNTER — Ambulatory Visit (INDEPENDENT_AMBULATORY_CARE_PROVIDER_SITE_OTHER): Payer: Medicare HMO | Admitting: Internal Medicine

## 2014-06-19 ENCOUNTER — Encounter: Payer: Self-pay | Admitting: Internal Medicine

## 2014-06-19 VITALS — BP 161/79 | HR 64 | Temp 97.5°F | Wt 178.4 lb

## 2014-06-19 DIAGNOSIS — I2510 Atherosclerotic heart disease of native coronary artery without angina pectoris: Secondary | ICD-10-CM

## 2014-06-19 DIAGNOSIS — E11319 Type 2 diabetes mellitus with unspecified diabetic retinopathy without macular edema: Secondary | ICD-10-CM

## 2014-06-19 DIAGNOSIS — E785 Hyperlipidemia, unspecified: Secondary | ICD-10-CM

## 2014-06-19 DIAGNOSIS — Z23 Encounter for immunization: Secondary | ICD-10-CM

## 2014-06-19 LAB — LIPID PANEL
Cholesterol: 129 mg/dL (ref 0–200)
HDL: 31.7 mg/dL — ABNORMAL LOW (ref >39.00)
LDL Cholesterol: 68 mg/dL (ref 0–99)
NonHDL: 97.3
TRIGLYCERIDES: 148 mg/dL (ref 0.0–149.0)
Total CHOL/HDL Ratio: 4
VLDL: 29.6 mg/dL (ref 0.0–40.0)

## 2014-06-19 LAB — HEMOGLOBIN A1C: Hgb A1c MFr Bld: 6.5 % (ref 4.6–6.5)

## 2014-06-19 LAB — BASIC METABOLIC PANEL
BUN: 14 mg/dL (ref 6–23)
CALCIUM: 9.3 mg/dL (ref 8.4–10.5)
CO2: 26 mEq/L (ref 19–32)
Chloride: 102 mEq/L (ref 96–112)
Creatinine, Ser: 1 mg/dL (ref 0.4–1.5)
GFR: 79.68 mL/min (ref >60.00)
Glucose, Bld: 105 mg/dL — ABNORMAL HIGH (ref 70–99)
Potassium: 4.4 mEq/L (ref 3.5–5.1)
SODIUM: 136 meq/L (ref 135–145)

## 2014-06-19 MED ORDER — ISOSORBIDE MONONITRATE ER 60 MG PO TB24: 60.0000 mg | ORAL_TABLET | Freq: Two times a day (BID) | ORAL | Status: DC

## 2014-06-19 NOTE — Progress Notes (Signed)
Subjective:    Patient ID: Phillip Jordan, male    DOB: 10-11-1929, 80 y.o.   MRN: [id number]  DOS:  06/19/2014 Type of visit - description : rov Interval history: Had a fall 11-2013, landed on the right side, back pain got  better but in the last few weeks it has resurface. It is an ache at the right back just below the shoulder blade. No change with exertion or deep breathing. Described as a soreness CAD, asymptomatic, needs a refill on one of his medications. BP noted to be elevated today, ambulatory BP is normal High cholesterol, labs reviewed, due for labs Diabetes medication compliance, due for a A1c   ROS Denies chest pain, difficulty breathing No postprandial nausea vomiting No cough or bronchial congestion NO Gross hematuria  Past Medical History  Diagnosis Date  . COPD with asthma   . CAD (coronary artery disease)     a. s/p MI 1992. b. Prior hx stenting to RCA/Cx. b. 05/2013: [place] s/p DES to distal RCA then staged DES to prox LAD.   . Diabetes mellitus   . Hyperlipidemia   . Allergic rhinitis   . Aortic dissection     f/u by cards - AAA with distal dissection.  . Shingles     Past Surgical History  Procedure Laterality Date  . Back surgery  1993  . Cardiac catheterization      left, w/ coronary angiography and left ventriculograpy  . Ptca      w/ placement of drugeluting stent in the distal right coronary artery, and distal Cardiologist: [name]    History   Social History  . Marital Status: Married    Spouse Name: N/A    Number of Children: 3  . Years of Education: N/A   Occupational History  . retired    Social History Main Topics  . Smoking status: Former Smoker -- 1.50 packs/day for 39 years    Types: Cigarettes    Quit date: 08/24/1992  . Smokeless tobacco: Never Used  . Alcohol Use: No  . Drug Use: No  . Sexual Activity: Not on file   Other Topics Concern  . Not on file   Social History Narrative   Lives w/ wife, does local  mission work  .   3 kids, 8 Gk                     Medication List       This list is accurate as of: 06/19/14 11:59 PM.  Always use your most recent med list.               acetaminophen 500 MG tablet  Commonly known as:  TYLENOL  Take 1,000 mg by mouth every 6 (six) hours as needed for pain.     albuterol 108 (90 BASE) MCG/ACT inhaler  Commonly known as:  VENTOLIN HFA  Inhale 2 puffs into the lungs every 6 (six) hours as needed.     aspirin 81 MG tablet  Take 1 tablet (81 mg total) by mouth daily.     clopidogrel 75 MG tablet  Commonly known as:  PLAVIX  Take 1 tablet (75 mg total) by mouth daily.     glucose blood test strip  Commonly known as:  FREESTYLE TEST STRIPS  Use as instructed     isosorbide mononitrate 60 MG 24 hr tablet  Commonly known as:  IMDUR  Take 1 tablet (60 mg total) by mouth  2 (two) times daily.     loratadine 10 MG tablet  Commonly known as:  CLARITIN  Take 10 mg by mouth daily as needed for allergies.     metFORMIN 500 MG tablet  Commonly known as:  GLUCOPHAGE  TAKE ONE TABLET BY MOUTH TWICE DAILY WITH MEALS     mometasone-formoterol 200-5 MCG/ACT Aero  Commonly known as:  DULERA  Inhale 2 puffs into the lungs 2 (two) times daily.     montelukast 10 MG tablet  Commonly known as:  SINGULAIR  Take 1 tablet (10 mg total) by mouth at bedtime.     multivitamin with minerals Tabs tablet  Take 1 tablet by mouth daily.     nitroGLYCERIN 0.4 MG SL tablet  Commonly known as:  NITROSTAT  Place 1 tablet (0.4 mg total) under the tongue every 5 (five) minutes as needed.     pravastatin 40 MG tablet  Commonly known as:  PRAVACHOL  Take 1 tablet (40 mg total) by mouth daily.           Objective:   Physical Exam BP 161/79  Pulse 64  Temp(Src) 97.5 F (36.4 C) (Oral)  Wt 178 lb 6 oz (80.91 kg)  SpO2 96% General -- alert, well-developed, NAD.  Lungs -- normal respiratory effort, no intercostal retractions, no accessory muscle  use, and normal breath sounds.  Heart-- normal rate, regular rhythm, no murmur.  Abdomen-- Not distended, good bowel sounds,soft, non-tender. Back-- no TTP, area of perceived pain is normal to inspection and palpation Extremities-- no pretibial edema bilaterally  Neurologic--  alert & oriented X3. Speech normal, gait appropriate for age, strength symmetric and appropriate for age.  Psych-- Cognition and judgment appear intact. Cooperative with normal attention span and concentration. No anxious or depressed appearing.     Assessment & Plan:   Back pain, Pain at the right mid back after a fall April 2015, see history of present illness for description of the symptoms. Exam is negative, recommend observation, Tylenol, call if not better

## 2014-06-19 NOTE — Assessment & Plan Note (Signed)
Continue with Pravachol, check FLP

## 2014-06-19 NOTE — Assessment & Plan Note (Signed)
Continue with metformin, recommend to stay active in the winter, check A1c

## 2014-06-19 NOTE — Patient Instructions (Signed)
Get your blood work before you leave    Please come back to the office by 08-2014 for a physical exam. No  fasting

## 2014-06-19 NOTE — Progress Notes (Signed)
Pre visit review using our clinic review tool, if applicable. No additional management support is needed unless otherwise documented below in the visit note. 

## 2014-06-19 NOTE — Assessment & Plan Note (Addendum)
Asymptomatic refill Imdur. BP slightly elevated but is usually okay at other office visits and normal ambulatory BPs, recommend to continue checking bps

## 2014-06-26 ENCOUNTER — Encounter: Payer: Self-pay | Admitting: Internal Medicine

## 2014-06-26 ENCOUNTER — Ambulatory Visit (INDEPENDENT_AMBULATORY_CARE_PROVIDER_SITE_OTHER): Payer: Medicare HMO | Admitting: Internal Medicine

## 2014-06-26 VITALS — BP 141/76 | HR 69 | Temp 98.2°F | Wt 175.0 lb

## 2014-06-26 DIAGNOSIS — R42 Dizziness and giddiness: Secondary | ICD-10-CM

## 2014-06-26 MED ORDER — MECLIZINE HCL 12.5 MG PO TABS: 12.5000 mg | ORAL_TABLET | Freq: Three times a day (TID) | ORAL | Status: DC | PRN

## 2014-06-26 NOTE — Progress Notes (Signed)
Pre visit review using our clinic review tool, if applicable. No additional management support is needed unless otherwise documented below in the visit note. 

## 2014-06-26 NOTE — Progress Notes (Signed)
Subjective:    Patient ID: Phillip Jordan, male    DOB: 07-Feb-1930, 80 y.o.   MRN: [id number]  DOS:  06/26/2014 Type of visit - description : acute Interval history: Woke up at 3 AM in the morning w/ dizziness described as spinning along with some nausea. Went to the bathroom, then back  to bed and continue sleeping. At 6 AM in the morning was taking a shower, he bent over, almost fell because he got dizzy again, since then he is better. Symptoms remind him of a previous episode of vertigo. He mentions something about a UTI to my nurse but denies any urinary symptoms to me   ROS No chest pain or palpitations No vomiting or diarrhea No dysuria, gross hematuria difficulty urinating No headaches, double vision or slurred speech. No fever chills  Past Medical History  Diagnosis Date  . COPD with asthma   . CAD (coronary artery disease)     a. s/p MI 1992. b. Prior hx stenting to RCA/Cx. b. 05/2013: [place] s/p DES to distal RCA then staged DES to prox LAD.   . Diabetes mellitus   . Hyperlipidemia   . Allergic rhinitis   . Aortic dissection     f/u by cards - AAA with distal dissection.  . Shingles     Past Surgical History  Procedure Laterality Date  . Back surgery  1993  . Cardiac catheterization      left, w/ coronary angiography and left ventriculograpy  . Ptca      w/ placement of drugeluting stent in the distal right coronary artery, and distal Cardiologist: [name]    History   Social History  . Marital Status: Married    Spouse Name: N/A    Number of Children: 3  . Years of Education: N/A   Occupational History  . retired    Social History Main Topics  . Smoking status: Former Smoker -- 1.50 packs/day for 39 years    Types: Cigarettes    Quit date: 08/24/1992  . Smokeless tobacco: Never Used  . Alcohol Use: No  . Drug Use: No  . Sexual Activity: Not on file   Other Topics Concern  . Not on file   Social History Narrative   Lives w/ wife,  does local mission work  .   3 kids, 8 Gk                     Medication List       This list is accurate as of: 06/26/14  6:11 PM.  Always use your most recent med list.               acetaminophen 500 MG tablet  Commonly known as:  TYLENOL  Take 1,000 mg by mouth every 6 (six) hours as needed for pain.     albuterol 108 (90 BASE) MCG/ACT inhaler  Commonly known as:  VENTOLIN HFA  Inhale 2 puffs into the lungs every 6 (six) hours as needed.     aspirin 81 MG tablet  Take 1 tablet (81 mg total) by mouth daily.     clopidogrel 75 MG tablet  Commonly known as:  PLAVIX  Take 1 tablet (75 mg total) by mouth daily.     glucose blood test strip  Commonly known as:  FREESTYLE TEST STRIPS  Use as instructed     isosorbide mononitrate 60 MG 24 hr tablet  Commonly known as:  IMDUR  Take  1 tablet (60 mg total) by mouth 2 (two) times daily.     loratadine 10 MG tablet  Commonly known as:  CLARITIN  Take 10 mg by mouth daily as needed for allergies.     meclizine 12.5 MG tablet  Commonly known as:  ANTIVERT  Take 1 tablet (12.5 mg total) by mouth 3 (three) times daily as needed for dizziness.     metFORMIN 500 MG tablet  Commonly known as:  GLUCOPHAGE  TAKE ONE TABLET BY MOUTH TWICE DAILY WITH MEALS     mometasone-formoterol 200-5 MCG/ACT Aero  Commonly known as:  DULERA  Inhale 2 puffs into the lungs 2 (two) times daily.     montelukast 10 MG tablet  Commonly known as:  SINGULAIR  Take 1 tablet (10 mg total) by mouth at bedtime.     multivitamin with minerals Tabs tablet  Take 1 tablet by mouth daily.     nitroGLYCERIN 0.4 MG SL tablet  Commonly known as:  NITROSTAT  Place 1 tablet (0.4 mg total) under the tongue every 5 (five) minutes as needed.     pravastatin 40 MG tablet  Commonly known as:  PRAVACHOL  Take 1 tablet (40 mg total) by mouth daily.           Objective:   Physical Exam BP 141/76 mmHg  Pulse 69  Temp(Src) 98.2 F (36.8 C) (Oral)   Wt 175 lb (79.379 kg)  SpO2 95%  General -- alert, well-developed, NAD.  HEENT-- Not pale.  Lungs -- normal respiratory effort, no intercostal retractions, no accessory muscle use, and normal breath sounds.  Heart-- normal rate, regular rhythm, no murmur.   Neurologic--  alert & oriented X3. Speech normal, gait appropriate for age, strength symmetric and appropriate for age.  DTRs symmetric. EOMI, PERLA  . Romberg absent Psych-- Cognition and judgment appear intact. Cooperative with normal attention span and concentration. No anxious or depressed appearing.       Assessment & Plan:

## 2014-06-26 NOTE — Assessment & Plan Note (Signed)
Dizziness, Most likely peripheral and  benign. Recommend rest, observation. Patient would like to "take something" as he is leaving town in few days. Prescribed Antivert, warned about drowsiness. Also recommend not to drive if he feels dizzy. See  instructions

## 2014-06-26 NOTE — Patient Instructions (Signed)
Rest and drink plenty of fluids  Take Antivert as needed, careful because it may cause drowsiness  If symptoms get worse, or they come along with other problems such as headaches, double vision, slurred speech, difficulty moving the arms or legs: Go to the ER.  Call if not improved next 10 days  Vertigo Vertigo means you feel like you or your surroundings are moving when they are not. Vertigo can be dangerous if it occurs when you are at work, driving, or performing difficult activities.  CAUSES  Vertigo occurs when there is a conflict of signals sent to your brain from the visual and sensory systems in your body. There are many different causes of vertigo, including:  Infections, especially in the inner ear.  A bad reaction to a drug or misuse of alcohol and medicines.  Withdrawal from drugs or alcohol.  Rapidly changing positions, such as lying down or rolling over in bed.  A migraine headache.  Decreased blood flow to the brain.  Increased pressure in the brain from a head injury, infection, tumor, or bleeding. SYMPTOMS  You may feel as though the world is spinning around or you are falling to the ground. Because your balance is upset, vertigo can cause nausea and vomiting. You may have involuntary eye movements (nystagmus). DIAGNOSIS  Vertigo is usually diagnosed by physical exam. If the cause of your vertigo is unknown, your caregiver may perform imaging tests, such as an MRI scan (magnetic resonance imaging). TREATMENT  Most cases of vertigo resolve on their own, without treatment. Depending on the cause, your caregiver may prescribe certain medicines. If your vertigo is related to body position issues, your caregiver may recommend movements or procedures to correct the problem. In rare cases, if your vertigo is caused by certain inner ear problems, you may need surgery. HOME CARE INSTRUCTIONS   Follow your caregiver's instructions.  Avoid driving.  Avoid operating heavy  machinery.  Avoid performing any tasks that would be dangerous to you or others during a vertigo episode.  Tell your caregiver if you notice that certain medicines seem to be causing your vertigo. Some of the medicines used to treat vertigo episodes can actually make them worse in some people. SEEK IMMEDIATE MEDICAL CARE IF:   Your medicines do not relieve your vertigo or are making it worse.  You develop problems with talking, walking, weakness, or using your arms, hands, or legs.  You develop severe headaches.  Your nausea or vomiting continues or gets worse.  You develop visual changes.  A family member notices behavioral changes.  Your condition gets worse. MAKE SURE YOU:  Understand these instructions.  Will watch your condition.  Will get help right away if you are not doing well or get worse. Document Released: 05/20/2005 Document Revised: 11/02/2011 Document Reviewed: 02/26/2011 [facility] Patient Information 2015 [place], [place]. This information is not intended to replace advice given to you by your health care provider. Make sure you discuss any questions you have with your health care provider.

## 2014-07-30 ENCOUNTER — Other Ambulatory Visit: Payer: Self-pay

## 2014-07-30 MED ORDER — MONTELUKAST SODIUM 10 MG PO TABS: 10.0000 mg | ORAL_TABLET | Freq: Every day | ORAL | Status: DC

## 2014-07-30 MED ORDER — METFORMIN HCL 500 MG PO TABS: 500.0000 mg | ORAL_TABLET | Freq: Two times a day (BID) | ORAL | Status: DC

## 2014-07-30 MED ORDER — PRAVASTATIN SODIUM 40 MG PO TABS: 40.0000 mg | ORAL_TABLET | Freq: Every day | ORAL | Status: DC

## 2014-08-02 ENCOUNTER — Encounter (HOSPITAL_COMMUNITY): Payer: Self-pay | Admitting: Cardiovascular Disease

## 2014-08-09 ENCOUNTER — Ambulatory Visit (INDEPENDENT_AMBULATORY_CARE_PROVIDER_SITE_OTHER): Payer: Medicare HMO | Admitting: Medical

## 2014-08-09 ENCOUNTER — Ambulatory Visit (HOSPITAL_BASED_OUTPATIENT_CLINIC_OR_DEPARTMENT_OTHER)
Admission: RE | Admit: 2014-08-09 | Discharge: 2014-08-09 | Disposition: A | Discharge status: Home / Self Care | Payer: Medicare HMO | Source: Ambulatory Visit | Attending: Medical | Admitting: Medical

## 2014-08-09 ENCOUNTER — Encounter: Payer: Self-pay | Admitting: Medical

## 2014-08-09 VITALS — BP 158/79 | HR 77 | Temp 97.8°F | Ht 68.75 in | Wt 178.6 lb

## 2014-08-09 DIAGNOSIS — R059 Cough, unspecified: Secondary | ICD-10-CM

## 2014-08-09 DIAGNOSIS — J209 Acute bronchitis, unspecified: Secondary | ICD-10-CM

## 2014-08-09 DIAGNOSIS — J449 Chronic obstructive pulmonary disease, unspecified: Secondary | ICD-10-CM | POA: Insufficient documentation

## 2014-08-09 DIAGNOSIS — I2510 Atherosclerotic heart disease of native coronary artery without angina pectoris: Secondary | ICD-10-CM | POA: Insufficient documentation

## 2014-08-09 DIAGNOSIS — R05 Cough: Secondary | ICD-10-CM

## 2014-08-09 MED ORDER — BENZONATATE 100 MG PO CAPS: 100.0000 mg | ORAL_CAPSULE | Freq: Three times a day (TID) | ORAL | Status: DC | PRN

## 2014-08-09 MED ORDER — PREDNISONE 20 MG PO TABS: ORAL_TABLET | ORAL | Status: DC

## 2014-08-09 MED ORDER — FLUTICASONE PROPIONATE 50 MCG/ACT NA SUSP: 2.0000 | Freq: Every day | NASAL | Status: DC

## 2014-08-09 MED ORDER — AMOXICILLIN-POT CLAVULANATE 875-125 MG PO TABS: 1.0000 | ORAL_TABLET | Freq: Two times a day (BID) | ORAL | Status: DC

## 2014-08-09 MED ORDER — ALBUTEROL SULFATE HFA 108 (90 BASE) MCG/ACT IN AERS: 2.0000 | INHALATION_SPRAY | Freq: Four times a day (QID) | RESPIRATORY_TRACT | Status: DC | PRN

## 2014-08-09 NOTE — Progress Notes (Signed)
Pre visit review using our clinic review tool, if applicable. No additional management support is needed unless otherwise documented below in the visit note. 

## 2014-08-09 NOTE — Progress Notes (Signed)
   Subjective:    Patient ID: DECARLOS EMPEY, male    DOB: 1929/12/09, 80 y.o.   MRN: [id number]  HPI   Pt in today reporting bad cough, nasal congestion and runny nose for 7  Days. Pt states he developed some pain in his back at first noticed when coughing now pain left lower ribs posteriorly and some beneath scapula. Pt feels congested in his chest. Several years ago had pneumonia. States gets bronchitis easily. Stopped smoking in 1994. Smoked about 35 years before that.  No chest pain at all. No arm pain reported.  Associated symptoms( below yes or no)  Fever-no Chills-no(But some sweating at night. Chest congestion-yes Sneezing- yes. some Itching eyes-no Sore throat- no Post-nasal drainage-yes. Wheezing-yes. A lot. Pt uses dulera. Pt also has albuterol. But has not used. Purulent nasal  drainage-no Fatigue-mild  Pt a1-c was 6.5 on last check in October.  Pt taking some otc products to decongestant.     Review of Systems  Constitutional: Negative for fever, chills and fatigue.  HENT: Positive for congestion, postnasal drip, sinus pressure and sore throat.   Respiratory: Positive for cough. Negative for wheezing.   Cardiovascular: Negative for chest pain and palpitations.  Musculoskeletal: Negative for neck pain.  Neurological: Negative for dizziness and headaches.  Hematological: Negative for adenopathy. Does not bruise/bleed easily.       Objective:   Physical Exam  General  Mental Status - Alert. General Appearance - Well groomed. Not in acute distress.  Skin Rashes- No Rashes.  HEENT Head- Normal. Ear Auditory Canal - Left- Normal. Right - Normal.Tympanic Membrane- Left- Normal. Right- Normal. Eye Sclera/Conjunctiva- Left- Normal. Right- Normal. Nose & Sinuses Nasal Mucosa- Left-  Not boggy or Congested. Right-  Not  boggy or Congested. Mouth & Throat Lips: Upper Lip- Normal: no dryness, cracking, pallor, cyanosis, or vesicular eruption. Lower  Lip-Normal: no dryness, cracking, pallor, cyanosis or vesicular eruption. Buccal Mucosa- Bilateral- No Aphthous ulcers. Oropharynx- No Discharge or Erythema. Tonsils: Characteristics- Bilateral- No Erythema or Congestion. Size/Enlargement- Bilateral- No enlargement. Discharge- bilateral-None.  Neck Neck- Supple. No Masses.   Chest and Lung Exam Auscultation: Breath Sounds:- even and unlabored, but bilateral upper lobe rhonchi. Some expiratory wheezing.  Cardiovascular Auscultation:Rythm- Regular, rate and rhythm. Murmurs & Other Heart Sounds:Ausculatation of the heart reveal- No Murmurs.  Lymphatic Head & Neck General Head & Neck Lymphatics: Bilateral: Description- No Localized lymphadenopathy.  Lower ext- no pedal edema.       Assessment & Plan:

## 2014-08-09 NOTE — Assessment & Plan Note (Signed)
You appear to have bronchitis.(with wheezing) Rest hydrate and tylenol for fever. I am prescribing  benzonatate cough medicine, and augmentin antibiotic. For your nasal congestion I am prescribing flonase nasal steroid.   For your wheezing use dulera and albuterol. If having to use albuterol more than one time a day then start oral prednisone. And make sure eating low sugar diet.  Get cxr today

## 2014-08-09 NOTE — Patient Instructions (Addendum)
You appear to have bronchitis.(with wheezing) Rest hydrate and tylenol for fever. I am prescribing  benzonatate cough medicine, and augmentin antibiotic. For your nasal congestion I am prescribing flonase nasal steroid.   For your wheezing use dulera and albuterol. If having to use albuterol more than one time a day then start oral prednisone. And make sure eating low sugar diet.  Get cxr today(Back pain may be strained muscle from coughing or possible pneumonia) Follow up in 7-10 days or as needed

## 2014-08-20 ENCOUNTER — Ambulatory Visit (INDEPENDENT_AMBULATORY_CARE_PROVIDER_SITE_OTHER): Payer: Medicare HMO | Admitting: Internal Medicine

## 2014-08-20 ENCOUNTER — Encounter: Payer: Self-pay | Admitting: Internal Medicine

## 2014-08-20 VITALS — BP 148/90 | HR 76 | Temp 97.4°F | Wt 174.5 lb

## 2014-08-20 DIAGNOSIS — M5414 Radiculopathy, thoracic region: Secondary | ICD-10-CM

## 2014-08-20 MED ORDER — TRAMADOL HCL 50 MG PO TABS: 50.0000 mg | ORAL_TABLET | Freq: Three times a day (TID) | ORAL | Status: DC | PRN

## 2014-08-20 NOTE — Patient Instructions (Signed)
The best exercises for the  back include freestyle swimming, stretch aerobics, and yoga.

## 2014-08-20 NOTE — Progress Notes (Signed)
Pre visit review using our clinic review tool, if applicable. No additional management support is needed unless otherwise documented below in the visit note. 

## 2014-08-20 NOTE — Progress Notes (Signed)
   Subjective:    Patient ID: Phillip Jordan, male    DOB: January 31, 1930, 80 y.o.   MRN: [id number]  HPI  He was seen for acute bronchitic symptoms 08/09/14 and treated with Augmentin and Tessalon Perles. He describes a significant cough during the week prior to that office visit. With the cough he developed discomfort in the left scapular area.  The bronchitic symptoms have resolved but he continues to have discomfort in the left scapular area which he describes as dull and constant.  It is affected by position change. When sitting it will improve feel if he leans to his left. It is worse if he leans laterally to his right.  Past medical history is positive for COPD as well as fibrosis [facility]. PMH of LS surgery      Review of Systems At this time he denies fever, chills, sweats, productive cough  He has no numbness, tingling, weakness in his extremities  There's been no impact on his gait or balance.  He denies any associated rash in the area of discomfort     Objective:   Physical Exam  Pertinent or positive findings include: Pattern alopecia is present He has an upper partial. He wears  hearing aids He has low-grade dry rales at the bases without increased work of breathing There is abnormal accentuated curvature of the inferior thoracic spine with posterior protrusion in this area.  General appearance :adequately nourished; in no distress. Eyes: No conjunctival inflammation or scleral icterus is present. Oral exam:  Lips and gums are healthy appearing.There is no oropharyngeal erythema or exudate noted.  Heart:  Normal rate and regular rhythm. S1 and S2 normal without gallop, murmur, click, rub or other extra sounds   No flank tenderness to percussion.. No pain to thoracic compression. Vascular : carotid & radial pulses equal ; no bruits present. Skin:Warm & dry.  Intact without suspicious lesions or rashes ; no jaundice or tenting Lymphatic: No  lymphadenopathy is noted about the head, neck, axilla           Assessment & Plan:  #1 thoracic radiculopathy in the context of abnormal curvature and recent acute bronchitic episode with significant paroxysms of coughing See orders & AVS

## 2014-08-28 ENCOUNTER — Ambulatory Visit (INDEPENDENT_AMBULATORY_CARE_PROVIDER_SITE_OTHER): Payer: PPO | Admitting: Internal Medicine

## 2014-08-28 ENCOUNTER — Encounter: Payer: Self-pay | Admitting: Internal Medicine

## 2014-08-28 VITALS — BP 132/64 | HR 73 | Temp 98.1°F | Ht 69.0 in | Wt 172.1 lb

## 2014-08-28 DIAGNOSIS — J209 Acute bronchitis, unspecified: Secondary | ICD-10-CM

## 2014-08-28 DIAGNOSIS — Z0000 Encounter for general adult medical examination without abnormal findings: Secondary | ICD-10-CM

## 2014-08-28 DIAGNOSIS — E11319 Type 2 diabetes mellitus with unspecified diabetic retinopathy without macular edema: Secondary | ICD-10-CM

## 2014-08-28 DIAGNOSIS — I7100 Dissection of unspecified site of aorta: Secondary | ICD-10-CM

## 2014-08-28 DIAGNOSIS — I2510 Atherosclerotic heart disease of native coronary artery without angina pectoris: Secondary | ICD-10-CM

## 2014-08-28 DIAGNOSIS — E785 Hyperlipidemia, unspecified: Secondary | ICD-10-CM

## 2014-08-28 LAB — TSH: TSH: 1.3 u[IU]/mL (ref 0.35–4.50)

## 2014-08-28 LAB — CBC WITH DIFFERENTIAL/PLATELET
BASOS PCT: 0.8 % (ref 0.0–3.0)
Basophils Absolute: 0.1 10*3/uL (ref 0.0–0.1)
EOS ABS: 0.3 10*3/uL (ref 0.0–0.7)
Eosinophils Relative: 2.9 % (ref 0.0–5.0)
HCT: 44 % (ref 39.0–52.0)
HEMOGLOBIN: 14.7 g/dL (ref 13.0–17.0)
LYMPHS PCT: 14.6 % (ref 12.0–46.0)
Lymphs Abs: 1.5 10*3/uL (ref 0.7–4.0)
MCHC: 33.5 g/dL (ref 30.0–36.0)
MCV: 88 fl (ref 78.0–100.0)
MONO ABS: 1.1 10*3/uL — AB (ref 0.1–1.0)
Monocytes Relative: 10.4 % (ref 3.0–12.0)
Neutro Abs: 7.3 10*3/uL (ref 1.4–7.7)
Neutrophils Relative %: 71.3 % (ref 43.0–77.0)
Platelets: 382 10*3/uL (ref 150.0–400.0)
RBC: 5 Mil/uL (ref 4.22–5.81)
RDW: 12.8 % (ref 11.5–15.5)
WBC: 10.2 10*3/uL (ref 4.0–10.5)

## 2014-08-28 MED ORDER — CLOPIDOGREL BISULFATE 75 MG PO TABS: 75.0000 mg | ORAL_TABLET | Freq: Every day | ORAL | Status: DC

## 2014-08-28 NOTE — Assessment & Plan Note (Signed)
Well-controlled per last A1c, continue metformin

## 2014-08-28 NOTE — Progress Notes (Signed)
Subjective:    Patient ID: Phillip Jordan, male    DOB: [birth year], 81 y.o.   MRN: [id number]  DOS:  08/28/2014 Type of visit - description :   Here for Medicare AWV:   1.Risk factors based on Past M, S, F history: yes   2. Physical Activities: [employer] 3 times a week, active   3. Depression/mood: neg screening 4. Hearing: poor hearing , has hearing aids, sees the [place] or Dr Thornell Mule   5. ADL's: totally independent , still drives   6. Fall Risk: no recent falls. Prevention discussed   7. Home Safety: feels safe at home   8. Height, weight, &visual acuity:see VS, vision corrected, sees eye doctor yearly   9. Counseling: yes , see below   10.Labs ordered based on risk factors: yes   11.Referral Coordination: if needed   12.Care Plan: see A/P   13. Cognitive Assessment : cognition, memory, motor skills seem appropriate for age  33. Care team updated  15. Printed plan provided  in addition, we discussed the following issues COPD, good compliance with 2 inhalers , using albuterol 1 or 2 times a day Diabetes, good medication compliance, CBGs 90, 100 High cholesterol, on statins, no apparent side effects As recently seen with bronchitis, x-ray 08/09/2014 showed no pneumonia, he finished antibiotics, is feeling better however has developed pain at the left mid back, worse when he coughs, decrease when lying down. Went to see Dr. Linna Darner, prescribed tramadol which helps temporarily. Denies any rash in that area. Pain does not radiate, no upper or lower extremity paresthesias  ROS Denies actual anterior chest pain, lower extremity edema No nausea, vomiting, diarrhea No sputum production or hemoptysis No dysuria, gross hematuria difficulty urinating No calf pain or swelling  Past Medical History  Diagnosis Date  . COPD with asthma   . CAD (coronary artery disease)     a. s/p MI 1992. b. Prior hx stenting to RCA/Cx. b. 05/2013: [place] s/p DES to distal RCA then staged DES to prox LAD.   . Diabetes  mellitus   . Hyperlipidemia   . Allergic rhinitis   . Aortic dissection     f/u by cards - AAA with distal dissection.  . Shingles     Past Surgical History  Procedure Laterality Date  . Back surgery  1993  . Cardiac catheterization      left, w/ coronary angiography and left ventriculograpy  . Ptca      w/ placement of drugeluting stent in the distal right coronary artery, and distal Cardiologist: [name]  . Left heart catheterization with coronary angiogram N/A 06/09/2013    Procedure: LEFT HEART CATHETERIZATION WITH CORONARY ANGIOGRAM;  Surgeon: [name], MD;  Location[facility] CATH LAB;  Service: Cardiovascular;  Laterality: N/A;  . Percutaneous coronary stent intervention (pci-s) N/A 06/12/2013    Procedure: PERCUTANEOUS CORONARY STENT INTERVENTION (PCI-S);  Surgeon: [name], MD;  Location: [facility] CATH LAB;  Service: Cardiovascular;  Laterality: N/A;  prox LAD    History   Social History  . Marital Status: Married    Spouse Name: N/A    Number of Children: 3  . Years of Education: N/A   Occupational History  . retired    Social History Main Topics  . Smoking status: Former Smoker -- 1.50 packs/day for 39 years    Types: Cigarettes    Quit date: 08/24/1992  . Smokeless tobacco: Never Used  . Alcohol Use: No  . Drug  Use: No  . Sexual Activity: Not on file   Other Topics Concern  . Not on file   Social History Narrative   Lives w/ wife, does local mission work  .   3 kids, 54 Gk-GGK                  Family History  Problem Relation Age of Onset  . Prostate cancer Neg Hx   . Colon cancer Neg Hx   . Asthma Mother   . Heart disease Mother     CHF  . Heart disease Father     MI  . Cancer Mother     melanoma       Medication List       This list is accurate as of: 08/28/14  5:49 PM.  Always use your most recent med list.               acetaminophen 500 MG tablet  Commonly known as:  TYLENOL  Take 1,000 mg by mouth every 6  (six) hours as needed for pain.     albuterol 108 (90 BASE) MCG/ACT inhaler  Commonly known as:  PROVENTIL HFA;VENTOLIN HFA  Inhale 2 puffs into the lungs every 6 (six) hours as needed for wheezing or shortness of breath.     aspirin 81 MG tablet  Take 1 tablet (81 mg total) by mouth daily.     clopidogrel 75 MG tablet  Commonly known as:  PLAVIX  Take 1 tablet (75 mg total) by mouth daily.     fluticasone 50 MCG/ACT nasal spray  Commonly known as:  FLONASE  Place 2 sprays into both nostrils daily.     glucose blood test strip  Commonly known as:  FREESTYLE TEST STRIPS  Use as instructed     isosorbide mononitrate 60 MG 24 hr tablet  Commonly known as:  IMDUR  Take 1 tablet (60 mg total) by mouth 2 (two) times daily.     loratadine 10 MG tablet  Commonly known as:  CLARITIN  Take 10 mg by mouth daily as needed for allergies.     meclizine 12.5 MG tablet  Commonly known as:  ANTIVERT  Take 1 tablet (12.5 mg total) by mouth 3 (three) times daily as needed for dizziness.     metFORMIN 500 MG tablet  Commonly known as:  GLUCOPHAGE  Take 1 tablet (500 mg total) by mouth 2 (two) times daily with a meal.     mometasone-formoterol 200-5 MCG/ACT Aero  Commonly known as:  DULERA  Inhale 2 puffs into the lungs 2 (two) times daily.     montelukast 10 MG tablet  Commonly known as:  SINGULAIR  Take 1 tablet (10 mg total) by mouth at bedtime.     multivitamin with minerals Tabs tablet  Take 1 tablet by mouth daily.     nitroGLYCERIN 0.4 MG SL tablet  Commonly known as:  NITROSTAT  Place 1 tablet (0.4 mg total) under the tongue every 5 (five) minutes as needed.     pravastatin 40 MG tablet  Commonly known as:  PRAVACHOL  Take 1 tablet (40 mg total) by mouth daily.     predniSONE 20 MG tablet  Commonly known as:  DELTASONE  1 tab po tid x 3 days     traMADol 50 MG tablet  Commonly known as:  ULTRAM  Take 1 tablet (50 mg total) by mouth every 8 (eight) hours as needed.  Objective:   Physical Exam  Musculoskeletal:       Arms:  BP 132/64 mmHg  Pulse 73  Temp(Src) 98.1 F (36.7 C) (Oral)  Ht 5\' 9"  (1.753 m)  Wt 172 lb 2 oz (78.075 kg)  BMI 25.41 kg/m2  SpO2 94%  General -- alert, well-developed, NAD.  Neck --no thyromegaly  HEENT-- Not pale.  Lungs -- normal respiratory effort, no intercostal retractions, no accessory muscle use, and normal breath sounds.  Heart-- normal rate, regular rhythm, no murmur.  Abdomen-- Not distended, good bowel sounds,soft, non-tender. Extremities-- no pretibial edema bilaterally  Neurologic--  alert & oriented X3. Speech normal, gait appropriate for age, strength symmetric and appropriate for age.   Psych-- Cognition and judgment appear intact. Cooperative with normal attention span and concentration. No anxious or depressed appearing.        Assessment & Plan:

## 2014-08-28 NOTE — Patient Instructions (Signed)
Get your blood work before you leave   For pain use Tylenol or tramadol, let me know if not improved in the next 2 or 3 weeks  Please come back to the office in 4 months  for a routine check up  No fasting       Fall Prevention and Home Safety Falls cause injuries and can affect all age groups. It is possible to use preventive measures to significantly decrease the likelihood of falls. There are many simple measures which can make your home safer and prevent falls. OUTDOORS  Repair cracks and edges of walkways and driveways.  Remove high doorway thresholds.  Trim shrubbery on the main path into your home.  Have good outside lighting.  Clear walkways of tools, rocks, debris, and clutter.  Check that handrails are not broken and are securely fastened. Both sides of steps should have handrails.  Have leaves, snow, and ice cleared regularly.  Use sand or salt on walkways during winter months.  In the garage, clean up grease or oil spills. BATHROOM  Install night lights.  Install grab bars by the toilet and in the tub and shower.  Use non-skid mats or decals in the tub or shower.  Place a plastic non-slip stool in the shower to sit on, if needed.  Keep floors dry and clean up all water on the floor immediately.  Remove soap buildup in the tub or shower on a regular basis.  Secure bath mats with non-slip, double-sided rug tape.  Remove throw rugs and tripping hazards from the floors. BEDROOMS  Install night lights.  Make sure a bedside light is easy to reach.  Do not use oversized bedding.  Keep a telephone by your bedside.  Have a firm chair with side arms to use for getting dressed.  Remove throw rugs and tripping hazards from the floor. KITCHEN  Keep handles on pots and pans turned toward the center of the stove. Use back burners when possible.  Clean up spills quickly and allow time for drying.  Avoid walking on wet floors.  Avoid hot utensils and  knives.  Position shelves so they are not too high or low.  Place commonly used objects within easy reach.  If necessary, use a sturdy step stool with a grab bar when reaching.  Keep electrical cables out of the way.  Do not use floor polish or wax that makes floors slippery. If you must use wax, use non-skid floor wax.  Remove throw rugs and tripping hazards from the floor. STAIRWAYS  Never leave objects on stairs.  Place handrails on both sides of stairways and use them. Fix any loose handrails. Make sure handrails on both sides of the stairways are as long as the stairs.  Check carpeting to make sure it is firmly attached along stairs. Make repairs to worn or loose carpet promptly.  Avoid placing throw rugs at the top or bottom of stairways, or properly secure the rug with carpet tape to prevent slippage. Get rid of throw rugs, if possible.  Have an electrician put in a light switch at the top and bottom of the stairs. OTHER FALL PREVENTION TIPS  Wear low-heel or rubber-soled shoes that are supportive and fit well. Wear closed toe shoes.  When using a stepladder, make sure it is fully opened and both spreaders are firmly locked. Do not climb a closed stepladder.  Add color or contrast paint or tape to grab bars and handrails in your home. Place contrasting color  strips on first and last steps.  Learn and use mobility aids as needed. Install an electrical emergency response system.  Turn on lights to avoid dark areas. Replace light bulbs that burn out immediately. Get light switches that glow.  Arrange furniture to create clear pathways. Keep furniture in the same place.  Firmly attach carpet with non-skid or double-sided tape.  Eliminate uneven floor surfaces.  Select a carpet pattern that does not visually hide the edge of steps.  Be aware of all pets. OTHER HOME SAFETY TIPS  Set the water temperature for 120 F (48.8 C).  Keep emergency numbers on or near the  telephone.  Keep smoke detectors on every level of the home and near sleeping areas. Document Released: 07/31/2002 Document Revised: 02/09/2012 Document Reviewed: 10/30/2011 [facility] Patient Information 2015 [place], [place]. This information is not intended to replace advice given to you by your health care provider. Make sure you discuss any questions you have with your health care provider.       Preventive Care for Adults    Ages 64 and over  Blood pressure check.** / Every 1 to 2 years.  Lipid and cholesterol check.**/ Every 5 years beginning at age 48.  Lung cancer screening. / Every year if you are aged 38-80 years and have a 30-pack-year history of smoking and currently smoke or have quit within the past 15 years. Yearly screening is stopped once you have quit smoking for at least 15 years or develop a health problem that would prevent you from having lung cancer treatment.  Fecal occult blood test (FOBT) of stool. / Every year beginning at age 46 and continuing until age 66. You may not have to do this test if you get a colonoscopy every 10 years.  Flexible sigmoidoscopy** or colonoscopy.** / Every 5 years for a flexible sigmoidoscopy or every 10 years for a colonoscopy beginning at age 23 and continuing until age 73.  Hepatitis C blood test.** / For all people born from [birth year] through 1965 and any individual with known risks for hepatitis C.  Abdominal aortic aneurysm (AAA) screening.** / A one-time screening for ages 37 to 53 years who are current or former smokers.  Skin self-exam. / Monthly.  Influenza vaccine. / Every year.  Tetanus, diphtheria, and acellular pertussis (Tdap/Td) vaccine.** / 1 dose of Td every 10 years.  Varicella vaccine.** / Consult your health care provider.  Zoster vaccine.** / 1 dose for adults aged 67 years or older.  Pneumococcal 13-valent conjugate (PCV13) vaccine.** / Consult your health care provider.  Pneumococcal polysaccharide  (PPSV23) vaccine.** / 1 dose for all adults aged 18 years and older.  Meningococcal vaccine.** / Consult your health care provider.  Hepatitis A vaccine.** / Consult your health care provider.  Hepatitis B vaccine.** / Consult your health care provider.  Haemophilus influenzae type b (Hib) vaccine.** / Consult your health care provider. **Family history and personal history of risk and conditions may change your health care provider's recommendations. Document Released: 10/06/2001 Document Revised: 08/15/2013 Document Reviewed: 01/05/2011 [facility] Patient Information 2015 [place], [place]. This information is not intended to replace advice given to you by your health care provider. Make sure you discuss any questions you have with your health care provider.

## 2014-08-28 NOTE — Assessment & Plan Note (Signed)
Follow-up by cardiology, last ultrasound 11/2013, AAA stable, was rec f/u 2 years

## 2014-08-28 NOTE — Assessment & Plan Note (Signed)
Well-controlled per last FLP, continue Pravachol

## 2014-08-28 NOTE — Progress Notes (Signed)
Pre visit review using our clinic review tool, if applicable. No additional management support is needed unless otherwise documented below in the visit note. 

## 2014-08-28 NOTE — Assessment & Plan Note (Signed)
Much improvement except for pain at the left back. Chest x-ray with the onset of symptoms show no pneumonia, pain may be musculoskeletal from coughing, interestingly he had shingles about a year ago in that dermatome but no recent rash. Plan: Judicious use of Ultram and observation for now

## 2014-08-28 NOTE — Assessment & Plan Note (Signed)
Asymptomatic, continue with Pravachol, diabetes control, Plavix, Imdur, aspirin.

## 2014-08-28 NOTE — Assessment & Plan Note (Addendum)
Td 2007  pneumonia shot x 3 , last 05-2012 prevnar-- pt  thinks he had it, not per our records, revisit the issue next year had shingle shot already Had a flu    Cscope 2007, TICS, no polyps (Dr Sharlett Iles). Next Cscope...per GI  prostate cancer screening-- we agreed to stop screenings, see previous notes Diet and exercise discussed

## 2014-09-11 ENCOUNTER — Ambulatory Visit (HOSPITAL_BASED_OUTPATIENT_CLINIC_OR_DEPARTMENT_OTHER)
Admission: RE | Admit: 2014-09-11 | Discharge: 2014-09-11 | Disposition: A | Discharge status: Home / Self Care | Payer: PPO | Source: Ambulatory Visit | Attending: Internal Medicine | Admitting: Internal Medicine

## 2014-09-11 ENCOUNTER — Ambulatory Visit (INDEPENDENT_AMBULATORY_CARE_PROVIDER_SITE_OTHER): Payer: PPO | Admitting: Internal Medicine

## 2014-09-11 ENCOUNTER — Encounter: Payer: Self-pay | Admitting: Internal Medicine

## 2014-09-11 VITALS — BP 159/77 | HR 80 | Temp 98.2°F | Wt 171.5 lb

## 2014-09-11 DIAGNOSIS — R05 Cough: Secondary | ICD-10-CM | POA: Insufficient documentation

## 2014-09-11 DIAGNOSIS — M546 Pain in thoracic spine: Secondary | ICD-10-CM

## 2014-09-11 DIAGNOSIS — R0781 Pleurodynia: Secondary | ICD-10-CM | POA: Diagnosis not present

## 2014-09-11 DIAGNOSIS — J209 Acute bronchitis, unspecified: Secondary | ICD-10-CM

## 2014-09-11 DIAGNOSIS — M5414 Radiculopathy, thoracic region: Secondary | ICD-10-CM

## 2014-09-11 MED ORDER — TRAMADOL HCL 50 MG PO TABS: 50.0000 mg | ORAL_TABLET | Freq: Three times a day (TID) | ORAL | Status: DC | PRN

## 2014-09-11 NOTE — Progress Notes (Signed)
Subjective:    Patient ID: Phillip Jordan, male    DOB: Dec 21, 1929, 81 y.o.   MRN: [id number]  DOS:  09/11/2014 Type of visit - description : Acute visit Interval history: Continue with back pain, left side, mid aspect of the back, the pain is on and off, usually dull, lasts a few seconds and increase with deep breaths. Tramadol does help but does not like to take it very often   ROS Denies fever chills. No rash at the back Recent bronchitis  improved, no cough. No calf swelling or pain  Past Medical History  Diagnosis Date  . COPD with asthma   . CAD (coronary artery disease)     a. s/p MI 1992. b. Prior hx stenting to RCA/Cx. b. 05/2013: [place] s/p DES to distal RCA then staged DES to prox LAD.   . Diabetes mellitus   . Hyperlipidemia   . Allergic rhinitis   . Aortic dissection     f/u by cards - AAA with distal dissection.  . Shingles     Past Surgical History  Procedure Laterality Date  . Back surgery  1993  . Cardiac catheterization      left, w/ coronary angiography and left ventriculograpy  . Ptca      w/ placement of drugeluting stent in the distal right coronary artery, and distal Cardiologist: [name]  . Left heart catheterization with coronary angiogram N/A 06/09/2013    Procedure: LEFT HEART CATHETERIZATION WITH CORONARY ANGIOGRAM;  Surgeon: [name], MD;  Location: [facility] CATH LAB;  Service: Cardiovascular;  Laterality: N/A;  . Percutaneous coronary stent intervention (pci-s) N/A 06/12/2013    Procedure: PERCUTANEOUS CORONARY STENT INTERVENTION (PCI-S);  Surgeon: [name], MD;  Location: [facility] CATH LAB;  Service: Cardiovascular;  Laterality: N/A;  prox LAD    History   Social History  . Marital Status: Married    Spouse Name: N/A    Number of Children: 3  . Years of Education: N/A   Occupational History  . retired    Social History Main Topics  . Smoking status: Former Smoker -- 1.50 packs/day for 39 years    Types: Cigarettes     Quit date: 08/24/1992  . Smokeless tobacco: Never Used  . Alcohol Use: No  . Drug Use: No  . Sexual Activity: Not on file   Other Topics Concern  . Not on file   Social History Narrative   Lives w/ wife, does local mission work  .   3 kids, 11 Gk-GGK                     Medication List       This list is accurate as of: 09/11/14  5:56 PM.  Always use your most recent med list.               acetaminophen 500 MG tablet  Commonly known as:  TYLENOL  Take 1,000 mg by mouth every 6 (six) hours as needed for pain.     albuterol 108 (90 BASE) MCG/ACT inhaler  Commonly known as:  PROVENTIL HFA;VENTOLIN HFA  Inhale 2 puffs into the lungs every 6 (six) hours as needed for wheezing or shortness of breath.     aspirin 81 MG tablet  Take 1 tablet (81 mg total) by mouth daily.     clopidogrel 75 MG tablet  Commonly known as:  PLAVIX  Take 1 tablet (75 mg total) by mouth daily.  fluticasone 50 MCG/ACT nasal spray  Commonly known as:  FLONASE  Place 2 sprays into both nostrils daily.     glucose blood test strip  Commonly known as:  FREESTYLE TEST STRIPS  Use as instructed     isosorbide mononitrate 60 MG 24 hr tablet  Commonly known as:  IMDUR  Take 1 tablet (60 mg total) by mouth 2 (two) times daily.     loratadine 10 MG tablet  Commonly known as:  CLARITIN  Take 10 mg by mouth daily as needed for allergies.     meclizine 12.5 MG tablet  Commonly known as:  ANTIVERT  Take 1 tablet (12.5 mg total) by mouth 3 (three) times daily as needed for dizziness.     metFORMIN 500 MG tablet  Commonly known as:  GLUCOPHAGE  Take 1 tablet (500 mg total) by mouth 2 (two) times daily with a meal.     mometasone-formoterol 200-5 MCG/ACT Aero  Commonly known as:  DULERA  Inhale 2 puffs into the lungs 2 (two) times daily.     montelukast 10 MG tablet  Commonly known as:  SINGULAIR  Take 1 tablet (10 mg total) by mouth at bedtime.     multivitamin with minerals Tabs  tablet  Take 1 tablet by mouth daily.     nitroGLYCERIN 0.4 MG SL tablet  Commonly known as:  NITROSTAT  Place 1 tablet (0.4 mg total) under the tongue every 5 (five) minutes as needed.     pravastatin 40 MG tablet  Commonly known as:  PRAVACHOL  Take 1 tablet (40 mg total) by mouth daily.     traMADol 50 MG tablet  Commonly known as:  ULTRAM  Take 1 tablet (50 mg total) by mouth every 8 (eight) hours as needed.           Objective:   Physical Exam BP 159/77 mmHg  Pulse 80  Temp(Src) 98.2 F (36.8 C) (Oral)  Wt 171 lb 8 oz (77.792 kg)  SpO2 92% General -- alert, well-developed, NAD.   Lungs -- normal respiratory effort, no intercostal retractions, no accessory muscle use, and normal breath sounds.  Heart-- normal rate, regular rhythm, no murmur.  Abdomen-- Not distended, good bowel sounds,soft, non-tender. Back-- spine no TTP, no rash Extremities-- no pretibial edema bilaterally ; calves symmetric and not tender Neurologic--  alert & oriented X3. Speech normal, gait appropriate for age, strength symmetric and appropriate for age.  Psych-- Cognition and judgment appear intact. Cooperative with normal attention span and concentration. No anxious or depressed appearing.     Assessment & Plan:

## 2014-09-11 NOTE — Assessment & Plan Note (Signed)
Bronchitis symptoms resolved however he continue with left-sided back pain, suspect a muscle sprain, DDX includes a rib fracture and a vertebral fracture. Plan: Tylenol, tramadol. Rib x-rays If not gradually better consider further eval or a trial with other medications.

## 2014-09-11 NOTE — Patient Instructions (Signed)
X-ray at the first floor today   Take Tylenol 500 mg 2 tablets twice a day for  the next 10 days, then as needed Continue using tramadol at night or if the pain is severe  Call if not gradually better over the next 3 weeks

## 2014-09-11 NOTE — Progress Notes (Signed)
Pre visit review using our clinic review tool, if applicable. No additional management support is needed unless otherwise documented below in the visit note. 

## 2014-09-28 ENCOUNTER — Telehealth: Payer: Self-pay | Admitting: Internal Medicine

## 2014-09-28 NOTE — Telephone Encounter (Signed)
LM for Deborah/Betty x 1

## 2014-10-01 NOTE — Telephone Encounter (Signed)
Return call.Stanley A Dalton °

## 2014-10-01 NOTE — Telephone Encounter (Signed)
Lmomtcb for Deborah/Betty

## 2014-10-02 NOTE — Telephone Encounter (Signed)
Called number given below - call cannot be completed as dialed, this is not a working number.  Called [phone], LM for Phillip Jordan

## 2014-10-02 NOTE — Telephone Encounter (Signed)
lmtcb for Phillip Jordan

## 2014-10-02 NOTE — Telephone Encounter (Signed)
Phillip Jordan calling back about paper please call directly at [phone]

## 2014-10-02 NOTE — Telephone Encounter (Signed)
Phillip Jordan calling back [phone]

## 2014-10-02 NOTE — Telephone Encounter (Signed)
lmtcb for Deborah.  

## 2014-10-04 NOTE — Telephone Encounter (Signed)
[name] calling again to check on paper work.[name]

## 2014-10-04 NOTE — Telephone Encounter (Signed)
lmomtcb x1 

## 2014-10-04 NOTE — Telephone Encounter (Signed)
lmomtcb for [employer]

## 2014-10-08 NOTE — Telephone Encounter (Signed)
lmtcb for [name].  

## 2014-10-09 ENCOUNTER — Telehealth: Payer: Self-pay | Admitting: Internal Medicine

## 2014-10-09 NOTE — Telephone Encounter (Signed)
Was seen last month w/ pain in the chest after bronchitis, check on pt, feeling better?

## 2014-10-09 NOTE — Telephone Encounter (Signed)
LM for Phillip Jordan to return call.

## 2014-10-10 NOTE — Telephone Encounter (Signed)
lmtcb for Debra.  

## 2014-10-10 NOTE — Telephone Encounter (Signed)
Spoke with Pt regarding chest pain after bronchitis. Pt informed me he is no longer having symptoms of bronchitis for several weeks now. Informed him I would let Dr. [name] know. Pt verbalized understanding.

## 2014-10-10 NOTE — Telephone Encounter (Signed)
Fax received and placed in MW's lookat Will forward message to Magda Paganini to ensure this is mailed once MW completes Thanks!

## 2014-10-10 NOTE — Telephone Encounter (Signed)
Phillip Jordan returned call, spoke with Phillip Jordan who is with [place] - prescription assistance company Forms from the manufacturer that were sent that MW needs to sign/date, no rx needed Pt's assistance expires today   The forms were mailed to our office on 1.21.16 but she has yet to hear anything back, this is very difficult to track Phillip Jordan will fax this to triage but it MUST be mailed back to [place] and she would like a call back once we do this

## 2014-10-10 NOTE — Telephone Encounter (Signed)
Routing error

## 2014-10-10 NOTE — Telephone Encounter (Signed)
thx

## 2014-10-12 NOTE — Telephone Encounter (Signed)
Forms signed and mailed to [employer] Copies made and placed in MW lookat scan

## 2014-10-21 IMAGING — CR DG RIBS 2V*R*
3 series · 3 of 3 positions shown · non-contrast
Comparison: Chest x-ray of 06/05/2013

CLINICAL DATA: Fell 3 weeks ago, right antral lateral rib pain

EXAM:
RIGHT RIBS - 2 VIEW

[w ribs ap/pa upper right]
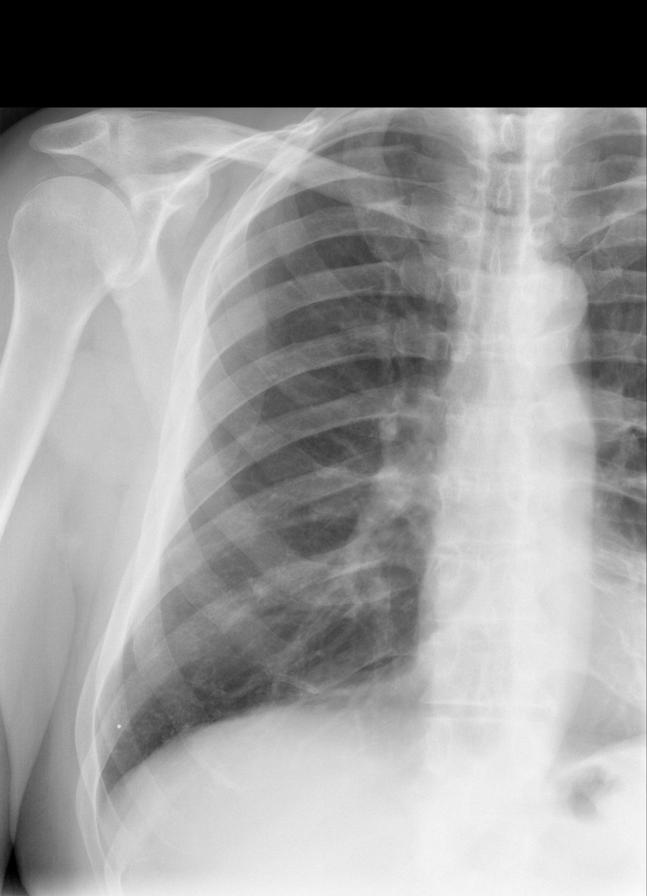

[w ribs ap/pa lower right]
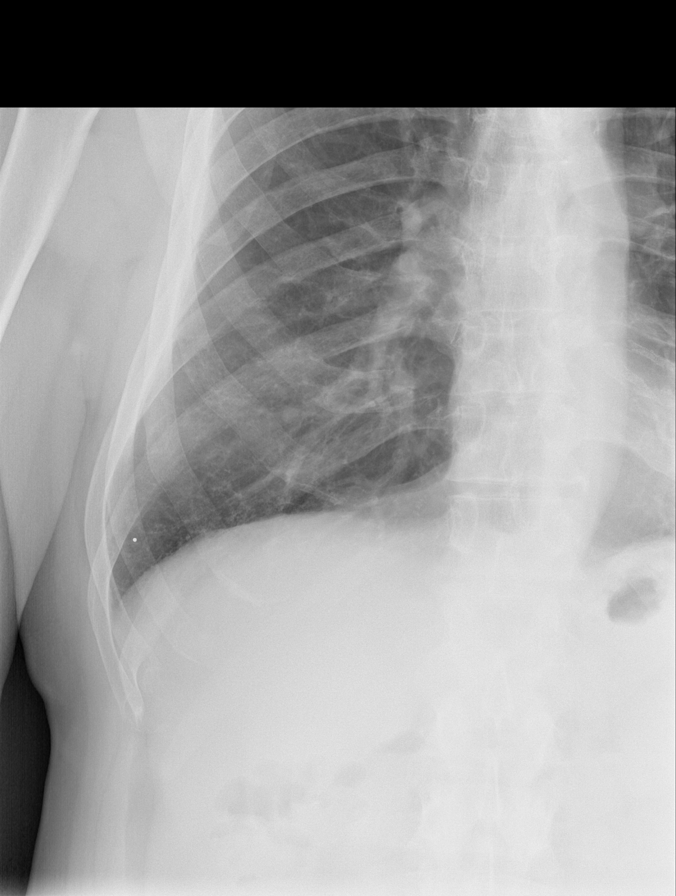

[w ribs oblique right]
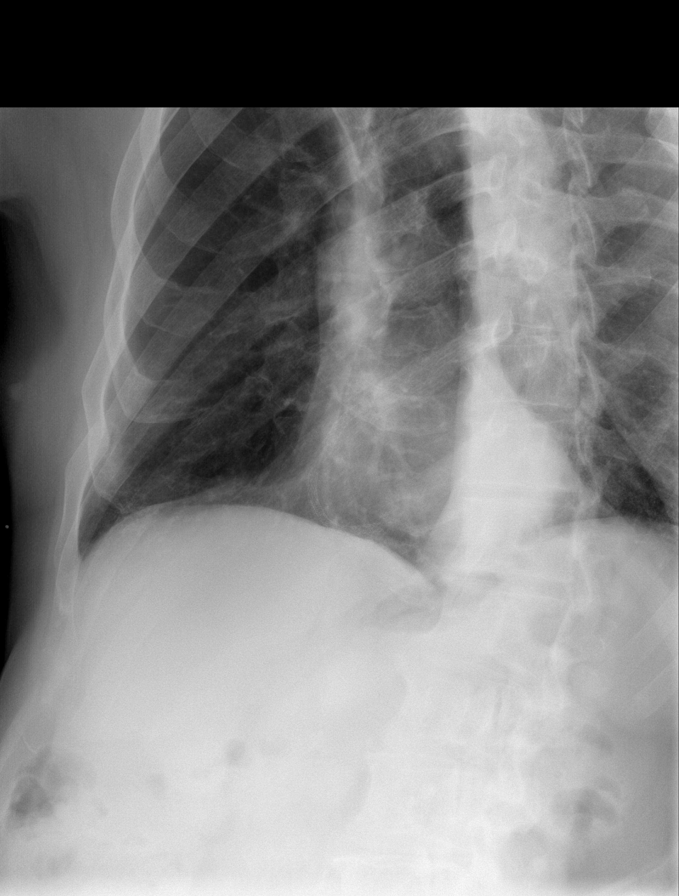

[3 of 3 positions shown; findings below may reference images not displayed]

FINDINGS: On one view, there does appear to be an acute fracture of the
anterior right sixth or seventh rib. No other abnormality is seen.
No pneumothorax is noted. No effusion is seen.
IMPRESSION: Apparent acute fracture of the anterior right sixth or seventh rib.

## 2014-12-10 ENCOUNTER — Other Ambulatory Visit (HOSPITAL_COMMUNITY): Payer: Self-pay | Admitting: Cardiology

## 2014-12-10 DIAGNOSIS — I714 Abdominal aortic aneurysm, without rupture, unspecified: Secondary | ICD-10-CM

## 2014-12-18 ENCOUNTER — Ambulatory Visit (HOSPITAL_COMMUNITY): Payer: PPO | Attending: Cardiology | Admitting: Cardiology

## 2014-12-18 DIAGNOSIS — I714 Abdominal aortic aneurysm, without rupture, unspecified: Secondary | ICD-10-CM

## 2014-12-18 NOTE — Progress Notes (Signed)
Abdominal Aorta Duplex performed  

## 2015-01-02 ENCOUNTER — Encounter: Payer: Self-pay | Admitting: Internal Medicine

## 2015-01-02 ENCOUNTER — Ambulatory Visit (INDEPENDENT_AMBULATORY_CARE_PROVIDER_SITE_OTHER): Payer: PPO | Admitting: Internal Medicine

## 2015-01-02 VITALS — BP 122/68 | HR 65 | Temp 98.2°F | Ht 69.0 in | Wt 176.1 lb

## 2015-01-02 DIAGNOSIS — E785 Hyperlipidemia, unspecified: Secondary | ICD-10-CM

## 2015-01-02 DIAGNOSIS — E11319 Type 2 diabetes mellitus with unspecified diabetic retinopathy without macular edema: Secondary | ICD-10-CM

## 2015-01-02 DIAGNOSIS — J439 Emphysema, unspecified: Secondary | ICD-10-CM | POA: Diagnosis not present

## 2015-01-02 DIAGNOSIS — E119 Type 2 diabetes mellitus without complications: Secondary | ICD-10-CM | POA: Diagnosis not present

## 2015-01-02 DIAGNOSIS — R42 Dizziness and giddiness: Secondary | ICD-10-CM

## 2015-01-02 LAB — BASIC METABOLIC PANEL
BUN: 14 mg/dL (ref 6–23)
CO2: 32 meq/L (ref 19–32)
Calcium: 9.9 mg/dL (ref 8.4–10.5)
Chloride: 99 mEq/L (ref 96–112)
Creatinine, Ser: 0.91 mg/dL (ref 0.40–1.50)
GFR: 84.64 mL/min (ref >60.00)
GLUCOSE: 133 mg/dL — AB (ref 70–99)
Potassium: 4.5 mEq/L (ref 3.5–5.1)
SODIUM: 137 meq/L (ref 135–145)

## 2015-01-02 LAB — ALT: ALT: 18 U/L (ref 0–53)

## 2015-01-02 LAB — HEMOGLOBIN A1C: Hgb A1c MFr Bld: 6.4 % (ref 4.6–6.5)

## 2015-01-02 LAB — AST: AST: 17 U/L (ref 0–37)

## 2015-01-02 MED ORDER — METFORMIN HCL 500 MG PO TABS: 500.0000 mg | ORAL_TABLET | Freq: Two times a day (BID) | ORAL | Status: DC

## 2015-01-02 MED ORDER — ISOSORBIDE MONONITRATE ER 60 MG PO TB24: 60.0000 mg | ORAL_TABLET | Freq: Two times a day (BID) | ORAL | Status: DC

## 2015-01-02 MED ORDER — PRAVASTATIN SODIUM 40 MG PO TABS: 40.0000 mg | ORAL_TABLET | Freq: Every day | ORAL | Status: DC

## 2015-01-02 MED ORDER — MECLIZINE HCL 12.5 MG PO TABS: 12.5000 mg | ORAL_TABLET | Freq: Three times a day (TID) | ORAL | Status: DC | PRN

## 2015-01-02 MED ORDER — MONTELUKAST SODIUM 10 MG PO TABS: 10.0000 mg | ORAL_TABLET | Freq: Every day | ORAL | Status: DC

## 2015-01-02 NOTE — Progress Notes (Signed)
Subjective:    Patient ID: Phillip Jordan, male    DOB: 04/14/1930, 81 y.o.   MRN: [id number]  DOS:  01/02/2015 Type of visit - description : acute Interval history: Vertigo, has symptoms on/off , these episodes [place] about 4[place]5 [place], his main concern today is know how much antivert he is supposed to be taking. Vertigo is similar to previous episodes and not severe. Since he is here for an acute visit we also talk about his routine problems: Diabetes, good compliance of medication COPD, actually under excellent control lately.    Review of Systems Denies chest pain, lower extremity edema, calf pain, palpitations. Mild nausea with vertigo but no vomiting. No headaches. Reports mild fatigue. Mild DOE, at baseline No recent cough or wheezing.  Past Medical History  Diagnosis Date  . COPD with asthma   . CAD (coronary artery disease)     a. s/p MI 1992. b. Prior hx stenting to RCA/Cx. b. 05/2013: [place] s/p DES to distal RCA then staged DES to prox LAD.   . Diabetes mellitus   . Hyperlipidemia   . Allergic rhinitis   . Aortic dissection     f/u by cards - AAA with distal dissection.  . Shingles     Past Surgical History  Procedure Laterality Date  . Back surgery  1993  . Cardiac catheterization      left, w/ coronary angiography and left ventriculograpy  . Ptca      w/ placement of drugeluting stent in the distal right coronary artery, and distal Cardiologist: [name]  . Left heart catheterization with coronary angiogram N/A 06/09/2013    Procedure: LEFT HEART CATHETERIZATION WITH CORONARY ANGIOGRAM;  Surgeon: [name], MD;  Location: [facility] CATH LAB;  Service: Cardiovascular;  Laterality: N/A;  . Percutaneous coronary stent intervention (pci-s) N/A 06/12/2013    Procedure: PERCUTANEOUS CORONARY STENT INTERVENTION (PCI-S);  Surgeon: [name], MD;  Location: [facility] CATH LAB;  Service: Cardiovascular;  Laterality: N/A;  prox  LAD    History   Social History  . Marital Status: Married    Spouse Name: N/A  . Number of Children: 3  . Years of Education: N/A   Occupational History  . retired    Social History Main Topics  . Smoking status: Former Smoker -- 1.50 packs/day for 39 years    Types: Cigarettes    Quit date: 08/24/1992  . Smokeless tobacco: Never Used  . Alcohol Use: No  . Drug Use: No  . Sexual Activity: Not on file   Other Topics Concern  . Not on file   Social History Narrative   Lives w/ wife, does local mission work  .   3 kids, 11 Gk-GGK                     Medication List       This list is accurate as of: 01/02/15  9:54 AM.  Always use your most recent med list.               acetaminophen 500 MG tablet  Commonly known as:  TYLENOL  Take 1,000 mg by mouth every 6 (six) hours as needed for pain.     albuterol 108 (90 BASE) MCG/ACT inhaler  Commonly known as:  PROVENTIL HFA;VENTOLIN HFA  Inhale 2 puffs into the lungs every 6 (six) hours as needed for wheezing or shortness of breath.  aspirin 81 MG tablet  Take 1 tablet (81 mg total) by mouth daily.     clopidogrel 75 MG tablet  Commonly known as:  PLAVIX  Take 1 tablet (75 mg total) by mouth daily.     fluticasone 50 MCG/ACT nasal spray  Commonly known as:  FLONASE  Place 2 sprays into both nostrils daily.     glucose blood test strip  Commonly known as:  FREESTYLE TEST STRIPS  Use as instructed     isosorbide mononitrate 60 MG 24 hr tablet  Commonly known as:  IMDUR  Take 1 tablet (60 mg total) by mouth 2 (two) times daily.     loratadine 10 MG tablet  Commonly known as:  CLARITIN  Take 10 mg by mouth daily as needed for allergies.     meclizine 12.5 MG tablet  Commonly known as:  ANTIVERT  Take 1 tablet (12.5 mg total) by mouth 3 (three) times daily as needed for dizziness.     metFORMIN 500 MG tablet  Commonly known as:  GLUCOPHAGE  Take 1 tablet (500 mg total) by mouth 2 (two) times daily  with a meal.     mometasone-formoterol 200-5 MCG/ACT Aero  Commonly known as:  DULERA  Inhale 2 puffs into the lungs 2 (two) times daily.     montelukast 10 MG tablet  Commonly known as:  SINGULAIR  Take 1 tablet (10 mg total) by mouth at bedtime.     multivitamin with minerals Tabs tablet  Take 1 tablet by mouth daily.     nitroGLYCERIN 0.4 MG SL tablet  Commonly known as:  NITROSTAT  Place 1 tablet (0.4 mg total) under the tongue every 5 (five) minutes as needed.     pravastatin 40 MG tablet  Commonly known as:  PRAVACHOL  Take 1 tablet (40 mg total) by mouth daily.     traMADol 50 MG tablet  Commonly known as:  ULTRAM  Take 1 tablet (50 mg total) by mouth every 8 (eight) hours as needed.           Objective:   Physical Exam BP 122/68 mmHg  Pulse 65  Temp(Src) 98.2 F (36.8 C) (Oral)  Ht 5\' 9"  (1.753 m)  Wt 176 lb 2 oz (79.89 kg)  BMI 26.00 kg/m2  SpO2 97%  General:   Well developed, well nourished . NAD.  HEENT:  Normocephalic . Face symmetric, atraumatic Lungs:  CTA B except for very few end expiratory wheezing Normal respiratory effort, no intercostal retractions, no accessory muscle use. Heart: RRR,  no murmur.  No pretibial edema bilaterally  Skin: Not pale. Not jaundice Neurologic:  alert & oriented X3.  Speech normal, gait appropriate for age and unassisted EOMI Motor symmetric. Psych--  Cognition and judgment appear intact.  Cooperative with normal attention span and concentration.  Behavior appropriate. No anxious or depressed appearing.       Assessment & Plan:

## 2015-01-02 NOTE — Assessment & Plan Note (Signed)
Routine medications, check BMP, LFTs, A1c

## 2015-01-02 NOTE — Progress Notes (Signed)
Pre visit review using our clinic review tool, if applicable. No additional management support is needed unless otherwise documented below in the visit note. 

## 2015-01-02 NOTE — Assessment & Plan Note (Addendum)
Had a mild episode of vertigo, recommend Antivert 12.5 mg 3 times a day as needed. If symptoms are persistent or different he needs to let me know. Consider an ENT referral   ?Epley maneuver

## 2015-01-02 NOTE — Assessment & Plan Note (Signed)
Few end expiratory wheezes on exam, otherwise she is asymptomatic. Continue with present care, refill Singulair

## 2015-01-02 NOTE — Patient Instructions (Signed)
Get your blood work before you leave    Antivert 12.5 mg one tablet 3 times a day as needed for vertigo  Come back to the office in 4 -5 months   for a routine check up . Come back fasting

## 2015-01-02 NOTE — Assessment & Plan Note (Signed)
Refill medications, last FLP satisfactory

## 2015-02-06 ENCOUNTER — Other Ambulatory Visit: Payer: Self-pay | Admitting: *Deleted

## 2015-02-06 MED ORDER — NITROGLYCERIN 0.4 MG SL SUBL: 0.4000 mg | SUBLINGUAL_TABLET | SUBLINGUAL | Status: DC | PRN

## 2015-02-18 ENCOUNTER — Other Ambulatory Visit: Payer: Self-pay

## 2015-03-26 ENCOUNTER — Ambulatory Visit: Payer: Medicare HMO

## 2015-04-02 ENCOUNTER — Encounter: Payer: Self-pay | Admitting: Podiatry

## 2015-04-02 ENCOUNTER — Ambulatory Visit (INDEPENDENT_AMBULATORY_CARE_PROVIDER_SITE_OTHER): Payer: PPO | Admitting: Podiatry

## 2015-04-02 DIAGNOSIS — M79676 Pain in unspecified toe(s): Secondary | ICD-10-CM

## 2015-04-02 DIAGNOSIS — B351 Tinea unguium: Secondary | ICD-10-CM

## 2015-04-02 DIAGNOSIS — E119 Type 2 diabetes mellitus without complications: Secondary | ICD-10-CM

## 2015-04-02 NOTE — Progress Notes (Signed)
Patient ID: Phillip Jordan, male   DOB: December 10, 1929, 81 y.o.   MRN: [id number] Complaint:  Visit Type: Patient returns to my office for continued preventative foot care services. Complaint: Patient states" my nails have grown long and thick and become painful to walk and wear shoes" Patient has been diagnosed with DM with no foot complications. The patient presents for preventative foot care services. No changes to ROS  Podiatric Exam: Vascular: dorsalis pedis and posterior tibial pulses are palpable bilateral. Capillary return is immediate. Temperature gradient is WNL. Skin turgor WNL  Sensorium: Normal Semmes Weinstein monofilament test except toes both feet.. Normal tactile sensation bilaterally. Nail Exam: Pt has thick disfigured discolored nails with subungual debris noted bilateral entire nail hallux through fifth toenails Ulcer Exam: There is no evidence of ulcer or pre-ulcerative changes or infection. Orthopedic Exam: Muscle tone and strength are WNL. No limitations in general ROM. No crepitus or effusions noted. Foot type and digits show no abnormalities. Bony prominences are unremarkable. Skin: No Porokeratosis. No infection or ulcers  Diagnosis:  Onychomycosis, , Pain in right toe, pain in left toes  Treatment & Plan Procedures and Treatment: Consent by patient was obtained for treatment procedures. The patient understood the discussion of treatment and procedures well. All questions were answered thoroughly reviewed. Debridement of mycotic and hypertrophic toenails, 1 through 5 bilateral and clearing of subungual debris. No ulceration, no infection noted.  Return Visit-Office Procedure: Patient instructed to return to the office for a follow up visit 3 months for continued evaluation and treatment.

## 2015-04-15 ENCOUNTER — Encounter: Payer: Self-pay | Admitting: Cardiovascular Disease

## 2015-04-15 ENCOUNTER — Ambulatory Visit (INDEPENDENT_AMBULATORY_CARE_PROVIDER_SITE_OTHER): Payer: PPO | Admitting: Cardiovascular Disease

## 2015-04-15 VITALS — BP 140/74 | HR 67 | Ht 69.0 in | Wt 173.8 lb

## 2015-04-15 DIAGNOSIS — I25810 Atherosclerosis of coronary artery bypass graft(s) without angina pectoris: Secondary | ICD-10-CM

## 2015-04-15 MED ORDER — NITROGLYCERIN 0.4 MG SL SUBL: 0.4000 mg | SUBLINGUAL_TABLET | SUBLINGUAL | Status: DC | PRN

## 2015-04-15 NOTE — Progress Notes (Signed)
Patient ID: Phillip Jordan, male   DOB: 15-Oct-1929, 81 y.o.   MRN: [id number] 81 y.o. male with history of CAD who was admitted 10/14 with unstable angina. Found to have severe stenoses in the RCA and the proximal to mid LAD. The RCA stenosis was treated with a DES. The LAD lesion was felt to be critical. The LAD had TIME-2 flow and a 95-99% lesion with plaque starting in the proximal vessel and extending into the mid vessel. The PCI of the LAD was staged.. Doing well  Not having chest pain.  Compliant with meds including plavix.  Having palpitations or skips  No rapid consistant pulses  Worse at rest   12/19/14  Abd [place] with aorta 2.5 cm  ROS: Denies fever, malais, weight loss, blurry vision, decreased visual acuity, cough, sputum, SOB, hemoptysis, pleuritic pain, palpitaitons, heartburn, abdominal pain, melena, lower extremity edema, claudication, or rash.  All other systems reviewed and negative  General: Affect appropriate Healthy:  appears stated age 35: normal Neck supple with no adenopathy JVP normal no bruits no thyromegaly Lungs clear with no wheezing and good diaphragmatic motion Heart:  S1/S2 no murmur, no rub, gallop or click PMI normal Abdomen: benighn, BS positve, no tenderness, no AAA no bruit.  No HSM or HJR Distal pulses intact with no bruits No edema Neuro non-focal Skin warm and dry No muscular weakness   Current Outpatient Prescriptions  Medication Sig Dispense Refill  . acetaminophen (TYLENOL) 500 MG tablet Take 1,000 mg by mouth every 6 (six) hours as needed for pain.    [name] albuterol (PROVENTIL HFA;VENTOLIN HFA) 108 (90 BASE) MCG/ACT inhaler Inhale 2 puffs into the lungs every 6 (six) hours as needed for wheezing or shortness of breath. 1 Inhaler 0  . aspirin 81 MG tablet Take 1 tablet (81 mg total) by mouth daily.    . clopidogrel (PLAVIX) 75 MG tablet Take 1 tablet (75 mg total) by mouth daily. 90 tablet 2  . fluticasone (FLONASE) 50 MCG/ACT nasal spray Place 2  sprays into both nostrils daily. 16 g 1  . glucose blood (FREESTYLE TEST STRIPS) test strip Use as instructed 100 each 12  . isosorbide mononitrate (IMDUR) 60 MG 24 hr tablet Take 1 tablet (60 mg total) by mouth 2 (two) times daily. 180 tablet 2  . loratadine (CLARITIN) 10 MG tablet Take 10 mg by mouth daily as needed for allergies.     [name] meclizine (ANTIVERT) 12.5 MG tablet Take 1 tablet (12.5 mg total) by mouth 3 (three) times daily as needed for dizziness. 90 tablet 0  . metFORMIN (GLUCOPHAGE) 500 MG tablet Take 1 tablet (500 mg total) by mouth 2 (two) times daily with a meal. 180 tablet 2  . mometasone-formoterol (DULERA) 200-5 MCG/ACT AERO Inhale 2 puffs into the lungs 2 (two) times daily. 1 Inhaler 0  . montelukast (SINGULAIR) 10 MG tablet Take 1 tablet (10 mg total) by mouth at bedtime. 90 tablet 2  . Multiple Vitamin (MULTIVITAMIN WITH MINERALS) TABS tablet Take 1 tablet by mouth daily.    . nitroGLYCERIN (NITROSTAT) 0.4 MG SL tablet Place 1 tablet (0.4 mg total) under the tongue every 5 (five) minutes as needed. 25 tablet 1  . pravastatin (PRAVACHOL) 40 MG tablet Take 1 tablet (40 mg total) by mouth daily. 90 tablet 2  . traMADol (ULTRAM) 50 MG tablet Take 1 tablet (50 mg total) by mouth every 8 (eight) hours as needed. 60 tablet 0   No current facility-administered medications for  this visit.    Allergies  Review of patient's allergies indicates no known allergies.  Electrocardiogram:   08/2013 SR RBBB possible old IMI  04/15/15  SR rate 67  PR 210 RBBB   Assessment and Plan CAD: .Stable with no angina and good activity level.  Continue medical Rx  HTN: Well controlled.  Continue current medications and low sodium Dash type diet.    AAA: upper normal 2.5 cm  No abdominal pain observe  DM:: Discussed low carb diet.  Target hemoglobin A1c is 6.5 or less.  Continue current medications.  Chol:  Cholesterol is at goal.  Continue current dose of statin and diet Rx.  No myalgias or  side effects.  F/U  LFT's in 6 months. Lab Results  Component Value Date   LDLCALC 68 06/19/2014

## 2015-04-15 NOTE — Patient Instructions (Signed)
Medication Instructions:  NO CHANGES  Labwork: NONE  Testing/Procedures: NONE  Follow-Up: Your physician wants you to follow-up in: YEAR WITH  DR NISHAN You will receive a reminder letter in the mail two months in advance. If you don't receive a letter, please call our office to schedule the follow-up appointment.   Any Other Special Instructions Will Be Listed Below (If Applicable).   

## 2015-05-02 ENCOUNTER — Ambulatory Visit (INDEPENDENT_AMBULATORY_CARE_PROVIDER_SITE_OTHER): Payer: PPO | Admitting: Internal Medicine

## 2015-05-02 ENCOUNTER — Encounter: Payer: Self-pay | Admitting: Internal Medicine

## 2015-05-02 VITALS — BP 128/68 | HR 64 | Temp 97.3°F | Ht 69.0 in | Wt 171.1 lb

## 2015-05-02 DIAGNOSIS — J4520 Mild intermittent asthma, uncomplicated: Secondary | ICD-10-CM | POA: Diagnosis not present

## 2015-05-02 DIAGNOSIS — E119 Type 2 diabetes mellitus without complications: Secondary | ICD-10-CM

## 2015-05-02 DIAGNOSIS — E785 Hyperlipidemia, unspecified: Secondary | ICD-10-CM

## 2015-05-02 DIAGNOSIS — Z23 Encounter for immunization: Secondary | ICD-10-CM | POA: Diagnosis not present

## 2015-05-02 DIAGNOSIS — Z09 Encounter for follow-up examination after completed treatment for conditions other than malignant neoplasm: Secondary | ICD-10-CM | POA: Insufficient documentation

## 2015-05-02 LAB — MICROALBUMIN / CREATININE URINE RATIO
Creatinine,U: 105.8 mg/dL
Microalb Creat Ratio: 2.4 mg/g (ref 0.0–30.0)
Microalb, Ur: 2.5 mg/dL — ABNORMAL HIGH (ref 0.0–1.9)

## 2015-05-02 LAB — HM DIABETES FOOT EXAM: HM Diabetic Foot Exam: ABNORMAL

## 2015-05-02 NOTE — Patient Instructions (Signed)
Provides a urine sample before you leave     Next visit  for a  physical exam by January 2017 please schedule an appointment at the front desk Please come back fasting   Diabetes and Foot Care Diabetes may cause you to have problems because of poor blood supply (circulation) to your feet and legs. This may cause the skin on your feet to become thinner, break easier, and heal more slowly. Your skin may become dry, and the skin may peel and crack. You may also have nerve damage in your legs and feet causing decreased feeling in them. You may not notice minor injuries to your feet that could lead to infections or more serious problems. Taking care of your feet is one of the most important things you can do for yourself.  HOME CARE INSTRUCTIONS  Wear shoes at all times, even in the house. Do not go barefoot. Bare feet are easily injured.  Check your feet daily for blisters, cuts, and redness. If you cannot see the bottom of your feet, use a mirror or ask someone for help.  Wash your feet with warm water (do not use hot water) and mild soap. Then pat your feet and the areas between your toes until they are completely dry. Do not soak your feet as this can dry your skin.  Apply a moisturizing lotion or petroleum jelly (that does not contain alcohol and is unscented) to the skin on your feet and to dry, brittle toenails. Do not apply lotion between your toes.  Trim your toenails straight across. Do not dig under them or around the cuticle. File the edges of your nails with an emery board or nail file.  Do not cut corns or calluses or try to remove them with medicine.  Wear clean socks or stockings every day. Make sure they are not too tight. Do not wear knee-high stockings since they may decrease blood flow to your legs.  Wear shoes that fit properly and have enough cushioning. To break in new shoes, wear them for just a few hours a day. This prevents you from injuring your feet. Always look in  your shoes before you put them on to be sure there are no objects inside.  Do not cross your legs. This may decrease the blood flow to your feet.  If you find a minor scrape, cut, or break in the skin on your feet, keep it and the skin around it clean and dry. These areas may be cleansed with mild soap and water. Do not cleanse the area with peroxide, alcohol, or iodine.  When you remove an adhesive bandage, be sure not to damage the skin around it.  If you have a wound, look at it several times a day to make sure it is healing.  Do not use heating pads or hot water bottles. They may burn your skin. If you have lost feeling in your feet or legs, you may not know it is happening until it is too late.  Make sure your health care provider performs a complete foot exam at least annually or more often if you have foot problems. Report any cuts, sores, or bruises to your health care provider immediately. SEEK MEDICAL CARE IF:   You have an injury that is not healing.  You have cuts or breaks in the skin.  You have an ingrown nail.  You notice redness on your legs or feet.  You feel burning or tingling in your legs or  feet.  You have pain or cramps in your legs and feet.  Your legs or feet are numb.  Your feet always feel cold. SEEK IMMEDIATE MEDICAL CARE IF:   There is increasing redness, swelling, or pain in or around a wound.  There is a red line that goes up your leg.  Pus is coming from a wound.  You develop a fever or as directed by your health care provider.  You notice a bad smell coming from an ulcer or wound. Document Released: 08/07/2000 Document Revised: 04/12/2013 Document Reviewed: 01/17/2013 [facility] Patient Information 2015 [place], [place]. This information is not intended to replace advice given to you by your health care provider. Make sure you discuss any questions you have with your health care provider.

## 2015-05-02 NOTE — Progress Notes (Signed)
Pre visit review using our clinic review tool, if applicable. No additional management support is needed unless otherwise documented below in the visit note. 

## 2015-05-02 NOTE — Assessment & Plan Note (Signed)
Diabetes: Well-controlled per last A1c, + retinopathy and neuropathy, feet care discussed. Check a microalbumin. Continue metformin Hyperlipidemia: Good compliance with Pravachol, last FLP satisfactory COPD asthma:  relatively well controlled using rescue inhalers twice a week Next visit 08-2015 for a physical exam.

## 2015-05-02 NOTE — Progress Notes (Signed)
Subjective:    Patient ID: Phillip Jordan, male    DOB: [birth year], 81 y.o.   MRN: [id number]  DOS:  05/02/2015 Type of visit - description :  Interval history: Cardiovascular: cardiology note from 03-2015 reviewed, felt to be a stable Diabetes: On metformin, not ambulatory CBGs COPD asthma: Good compliance of medication, occasional cough, no sputum production, uses albuterol on average twice a week.  Review of Systems Denies chest pain, lower extremity edema or shortness of breath No nausea, vomiting, diarrhea blood in the stools Has chronic decrease sensitivity at the great toes.   Past Medical History  Diagnosis Date  . COPD with asthma   . CAD (coronary artery disease)     a. s/p MI 1992. b. Prior hx stenting to RCA/Cx. b. 05/2013: [place] s/p DES to distal RCA then staged DES to prox LAD.   . Diabetes mellitus   . Hyperlipidemia   . Allergic rhinitis   . Aortic dissection     f/u by cards - AAA with distal dissection.  . Shingles   . Lichen planus     Right midline inferior chest, Dr. [name] Sensing    Past Surgical History  Procedure Laterality Date  . Back surgery  1993  . Cardiac catheterization      left, w/ coronary angiography and left ventriculograpy  . Ptca      w/ placement of drugeluting stent in the distal right coronary artery, and distal Cardiologist: [name]  . Left heart catheterization with coronary angiogram N/A 06/09/2013    Procedure: LEFT HEART CATHETERIZATION WITH CORONARY ANGIOGRAM;  Surgeon: [name], MD;  Location: [facility] CATH LAB;  Service: Cardiovascular;  Laterality: N/A;  . Percutaneous coronary stent intervention (pci-s) N/A 06/12/2013    Procedure: PERCUTANEOUS CORONARY STENT INTERVENTION (PCI-S);  Surgeon: [name], MD;  Location: [facility] CATH LAB;  Service: Cardiovascular;  Laterality: N/A;  prox LAD    Social History   Social History  . Marital Status: Married    Spouse Name: N/A  . Number of Children: 3  . Years  of Education: N/A   Occupational History  . retired    Social History Main Topics  . Smoking status: Former Smoker -- 1.50 packs/day for 39 years    Types: Cigarettes    Quit date: 08/24/1992  . Smokeless tobacco: Never Used  . Alcohol Use: No  . Drug Use: No  . Sexual Activity: Not on file   Other Topics Concern  . Not on file   Social History Narrative   Lives w/ wife, does local mission work  .   3 kids, 11 Gk-GGK                     Medication List       This list is accurate as of: 05/02/15  8:06 PM.  Always use your most recent med list.               acetaminophen 500 MG tablet  Commonly known as:  TYLENOL  Take 1,000 mg by mouth every 6 (six) hours as needed for pain.     albuterol 108 (90 BASE) MCG/ACT inhaler  Commonly known as:  PROVENTIL HFA;VENTOLIN HFA  Inhale 2 puffs into the lungs every 6 (six) hours as needed for wheezing or shortness of breath.     aspirin 81 MG tablet  Take 1 tablet (81 mg total) by mouth daily.     clopidogrel 75 MG  tablet  Commonly known as:  PLAVIX  Take 1 tablet (75 mg total) by mouth daily.     fluticasone 50 MCG/ACT nasal spray  Commonly known as:  FLONASE  Place 2 sprays into both nostrils daily.     glucose blood test strip  Commonly known as:  FREESTYLE TEST STRIPS  Use as instructed     isosorbide mononitrate 60 MG 24 hr tablet  Commonly known as:  IMDUR  Take 1 tablet (60 mg total) by mouth 2 (two) times daily.     loratadine 10 MG tablet  Commonly known as:  CLARITIN  Take 10 mg by mouth daily as needed for allergies.     meclizine 12.5 MG tablet  Commonly known as:  ANTIVERT  Take 1 tablet (12.5 mg total) by mouth 3 (three) times daily as needed for dizziness.     metFORMIN 500 MG tablet  Commonly known as:  GLUCOPHAGE  Take 1 tablet (500 mg total) by mouth 2 (two) times daily with a meal.     mometasone-formoterol 200-5 MCG/ACT Aero  Commonly known as:  DULERA  Inhale 2 puffs into the lungs 2  (two) times daily.     montelukast 10 MG tablet  Commonly known as:  SINGULAIR  Take 1 tablet (10 mg total) by mouth at bedtime.     multivitamin with minerals Tabs tablet  Take 1 tablet by mouth daily.     nitroGLYCERIN 0.4 MG SL tablet  Commonly known as:  NITROSTAT  Place 1 tablet (0.4 mg total) under the tongue every 5 (five) minutes as needed for chest pain (up to 3 doses only.).     pravastatin 40 MG tablet  Commonly known as:  PRAVACHOL  Take 1 tablet (40 mg total) by mouth daily.     traMADol 50 MG tablet  Commonly known as:  ULTRAM  Take by mouth every 8 (eight) hours as needed for moderate pain or severe pain.           Objective:   Physical Exam BP 128/68 mmHg  Pulse 64  Temp(Src) 97.3 F (36.3 C) (Oral)  Ht 5\' 9"  (1.753 m)  Wt 171 lb 2 oz (77.622 kg)  BMI 25.26 kg/m2  SpO2 96% General:   Well developed, well nourished . NAD.  HEENT:  Normocephalic . Face symmetric, atraumatic Lungs:  CTA B Normal respiratory effort, no intercostal retractions, no accessory muscle use. Heart: RRR,  no murmur.  No pretibial edema bilaterally  Diabetic foot exam: No edema, good pedal pulses, skin normal, decreased pinprick sensitivity at the great toes bilaterally Neurologic:  alert & oriented X3.  Speech normal, gait appropriate for age and unassisted Psych--  Cognition and judgment appear intact.  Cooperative with normal attention span and concentration.  Behavior appropriate. No anxious or depressed appearing.      Assessment & Plan:   Problem list > Diabetes, + retinopathy, + neuropathy Hyperlipidemia CV -CAD -Aortic dissection -RBBB COPD and asthma Shingles Lichen planus Dr. Ronnald Ramp Chronic hoarseness, status post ENT eval ~ 2012   A/P Diabetes: Well-controlled per last A1c, + retinopathy and neuropathy, feet care discussed. Check a microalbumin. Continue metformin Hyperlipidemia: Good compliance with Pravachol, last FLP satisfactory COPD asthma:   relatively well controlled using rescue inhalers twice a week Next visit 08-2015 for a physical exam.

## 2015-05-28 ENCOUNTER — Other Ambulatory Visit: Payer: Self-pay | Admitting: Internal Medicine

## 2015-06-05 LAB — HM DIABETES EYE EXAM

## 2015-06-06 ENCOUNTER — Encounter: Payer: Self-pay | Admitting: Internal Medicine

## 2015-07-11 ENCOUNTER — Ambulatory Visit (INDEPENDENT_AMBULATORY_CARE_PROVIDER_SITE_OTHER): Payer: PPO | Admitting: Internal Medicine

## 2015-07-11 ENCOUNTER — Ambulatory Visit (HOSPITAL_BASED_OUTPATIENT_CLINIC_OR_DEPARTMENT_OTHER)
Admission: RE | Admit: 2015-07-11 | Discharge: 2015-07-11 | Disposition: A | Discharge status: Home / Self Care | Payer: PPO | Source: Ambulatory Visit | Attending: Internal Medicine | Admitting: Internal Medicine

## 2015-07-11 ENCOUNTER — Encounter: Payer: Self-pay | Admitting: Internal Medicine

## 2015-07-11 VITALS — BP 132/66 | HR 61 | Temp 98.2°F | Ht 69.0 in | Wt 174.1 lb

## 2015-07-11 DIAGNOSIS — M546 Pain in thoracic spine: Secondary | ICD-10-CM

## 2015-07-11 DIAGNOSIS — Z09 Encounter for follow-up examination after completed treatment for conditions other than malignant neoplasm: Secondary | ICD-10-CM

## 2015-07-11 NOTE — Progress Notes (Signed)
Pre visit review using our clinic review tool, if applicable. No additional management support is needed unless otherwise documented below in the visit note. 

## 2015-07-11 NOTE — Progress Notes (Signed)
Subjective:    Patient ID: Phillip Jordan, male    DOB: 1930/02/28, 81 y.o.   MRN: [id number]  DOS:  07/11/2015 Type of visit - description : Acute Interval history: Reports has a back pain " flareup". Symptoms started a week ago, pain located at the right side of the thoracic spine, no radiation, it can be as intense and 7/10 particularly when he moved his torso. Tramadol and Tylenol do help. No recent injury or fall  Review of Systems Denies fever chills No cough No dysuria or gross hematuria  Past Medical History  Diagnosis Date  . COPD with asthma ([place])   . CAD (coronary artery disease)     a. s/p MI 1992. b. Prior hx stenting to RCA/Cx. b. 05/2013: [place] s/p DES to distal RCA then staged DES to prox LAD.   . Diabetes mellitus   . Hyperlipidemia   . Allergic rhinitis   . Aortic dissection ([place])     f/u by cards - AAA with distal dissection.  . Shingles   . Lichen planus     Right midline inferior chest, Dr. [name] Sensing    Past Surgical History  Procedure Laterality Date  . Back surgery  1993  . Cardiac catheterization      left, w/ coronary angiography and left ventriculograpy  . Ptca      w/ placement of drugeluting stent in the distal right coronary artery, and distal Cardiologist: [name]  . Left heart catheterization with coronary angiogram N/A 06/09/2013    Procedure: LEFT HEART CATHETERIZATION WITH CORONARY ANGIOGRAM;  Surgeon: [name], MD;  Location: [facility] CATH LAB;  Service: Cardiovascular;  Laterality: N/A;  . Percutaneous coronary stent intervention (pci-s) N/A 06/12/2013    Procedure: PERCUTANEOUS CORONARY STENT INTERVENTION (PCI-S);  Surgeon: [name], MD;  Location: [facility] CATH LAB;  Service: Cardiovascular;  Laterality: N/A;  prox LAD    Social History   Social History  . Marital Status: Married    Spouse Name: N/A  . Number of Children: 3  . Years of Education: N/A   Occupational History  . retired    Social History  Main Topics  . Smoking status: Former Smoker -- 1.50 packs/day for 39 years    Types: Cigarettes    Quit date: 08/24/1992  . Smokeless tobacco: Never Used  . Alcohol Use: No  . Drug Use: No  . Sexual Activity: Not on file   Other Topics Concern  . Not on file   Social History Narrative   Lives w/ wife, does local mission work  .   3 kids, 11 Gk-GGK                     Medication List       This list is accurate as of: 07/11/15  5:20 PM.  Always use your most recent med list.               acetaminophen 500 MG tablet  Commonly known as:  TYLENOL  Take 1,000 mg by mouth every 6 (six) hours as needed for pain.     albuterol 108 (90 BASE) MCG/ACT inhaler  Commonly known as:  PROVENTIL HFA;VENTOLIN HFA  Inhale 2 puffs into the lungs every 6 (six) hours as needed for wheezing or shortness of breath.     aspirin 81 MG tablet  Take 1 tablet (81 mg total) by mouth daily.     clopidogrel 75 MG tablet  Commonly  known as:  PLAVIX  Take 1 tablet (75 mg total) by mouth daily.     fluticasone 50 MCG/ACT nasal spray  Commonly known as:  FLONASE  Place 2 sprays into both nostrils daily.     glucose blood test strip  Commonly known as:  FREESTYLE TEST STRIPS  Use as instructed     isosorbide mononitrate 60 MG 24 hr tablet  Commonly known as:  IMDUR  Take 1 tablet (60 mg total) by mouth 2 (two) times daily.     loratadine 10 MG tablet  Commonly known as:  CLARITIN  Take 10 mg by mouth daily as needed for allergies.     meclizine 12.5 MG tablet  Commonly known as:  ANTIVERT  Take 1 tablet (12.5 mg total) by mouth 3 (three) times daily as needed for dizziness.     metFORMIN 500 MG tablet  Commonly known as:  GLUCOPHAGE  Take 1 tablet (500 mg total) by mouth 2 (two) times daily with a meal.     mometasone-formoterol 200-5 MCG/ACT Aero  Commonly known as:  DULERA  Inhale 2 puffs into the lungs 2 (two) times daily.     montelukast 10 MG tablet  Commonly known as:   SINGULAIR  Take 1 tablet (10 mg total) by mouth at bedtime.     multivitamin with minerals Tabs tablet  Take 1 tablet by mouth daily.     nitroGLYCERIN 0.4 MG SL tablet  Commonly known as:  NITROSTAT  Place 1 tablet (0.4 mg total) under the tongue every 5 (five) minutes as needed for chest pain (up to 3 doses only.).     pravastatin 40 MG tablet  Commonly known as:  PRAVACHOL  Take 1 tablet (40 mg total) by mouth daily.     traMADol 50 MG tablet  Commonly known as:  ULTRAM  Take by mouth every 8 (eight) hours as needed for moderate pain or severe pain.           Objective:   Physical Exam  Musculoskeletal:       Arms:  BP 132/66 mmHg  Pulse 61  Temp(Src) 98.2 F (36.8 C) (Oral)  Ht 5\' 9"  (1.753 m)  Wt 174 lb 2 oz (78.983 kg)  BMI 25.70 kg/m2  SpO2 95% General:   Well developed, well nourished . NAD.  HEENT:  Normocephalic . Face symmetric, atraumatic Lungs:  CTA B Normal respiratory effort, no intercostal retractions, no accessory muscle use. Heart: RRR,  no murmur.  no pretibial edema bilaterally  Abdomen:  Not distended, soft, non-tender. No rebound or rigidity. No bruit or pulsatile mass Skin: Not pale. Not jaundice Neurologic:  alert & oriented X3.  Speech normal, gait appropriate for age and unassisted. Motor and DTRs symmetric Psych--  Cognition and judgment appear intact.  Cooperative with normal attention span and concentration.  Behavior appropriate. No anxious or depressed appearing.    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 Ronnald Ramp Chronic hoarseness, status post ENT eval ~ 2012 Normal DEXA 2012  PLAN Thoracic back pain: He had this type of pain before, about a year ago after a fall. Likely a MSK pain, will get x-ray to be sure there is no fracture.  Otherwise continue with tramadol and Tylenol as needed. Very unlikely pain related to AAA RTC: Has an appointment  for January.

## 2015-07-11 NOTE — Assessment & Plan Note (Signed)
Thoracic back pain: He had this type of pain before, about a year ago after a fall. Likely a MSK pain, will get x-ray to be sure there is no fracture. Otherwise continue with tramadol and Tylenol as needed. Very unlikely pain related to AAA RTC: Has an appointment for January.

## 2015-07-11 NOTE — Patient Instructions (Addendum)
Stop by the first floor and get the XR    Continue taking tramadol as needed and Tylenol.  Use a warm compress  If the pain is not gradually improving in the next 2 weeks  If it gets severe or going to your abdomen: call the office immediately

## 2015-08-27 ENCOUNTER — Ambulatory Visit: Payer: Self-pay | Admitting: Internal Medicine

## 2015-08-27 ENCOUNTER — Encounter: Payer: Self-pay | Admitting: Internal Medicine

## 2015-08-28 ENCOUNTER — Emergency Department (HOSPITAL_BASED_OUTPATIENT_CLINIC_OR_DEPARTMENT_OTHER)
Admission: EM | Admit: 2015-08-28 | Discharge: 2015-08-28 | Disposition: A | Discharge status: Home / Self Care | Payer: PPO | Attending: Emergency Medicine | Admitting: Emergency Medicine

## 2015-08-28 ENCOUNTER — Emergency Department (HOSPITAL_BASED_OUTPATIENT_CLINIC_OR_DEPARTMENT_OTHER): Payer: PPO

## 2015-08-28 ENCOUNTER — Telehealth: Payer: Self-pay | Admitting: Internal Medicine

## 2015-08-28 ENCOUNTER — Ambulatory Visit: Payer: PPO | Admitting: Family Medicine

## 2015-08-28 ENCOUNTER — Encounter (HOSPITAL_BASED_OUTPATIENT_CLINIC_OR_DEPARTMENT_OTHER): Payer: Self-pay | Admitting: Emergency Medicine

## 2015-08-28 DIAGNOSIS — J441 Chronic obstructive pulmonary disease with (acute) exacerbation: Secondary | ICD-10-CM | POA: Insufficient documentation

## 2015-08-28 DIAGNOSIS — I2510 Atherosclerotic heart disease of native coronary artery without angina pectoris: Secondary | ICD-10-CM | POA: Diagnosis not present

## 2015-08-28 DIAGNOSIS — Z7982 Long term (current) use of aspirin: Secondary | ICD-10-CM | POA: Diagnosis not present

## 2015-08-28 DIAGNOSIS — Z872 Personal history of diseases of the skin and subcutaneous tissue: Secondary | ICD-10-CM | POA: Insufficient documentation

## 2015-08-28 DIAGNOSIS — J069 Acute upper respiratory infection, unspecified: Secondary | ICD-10-CM | POA: Diagnosis not present

## 2015-08-28 DIAGNOSIS — E785 Hyperlipidemia, unspecified: Secondary | ICD-10-CM | POA: Insufficient documentation

## 2015-08-28 DIAGNOSIS — Z7951 Long term (current) use of inhaled steroids: Secondary | ICD-10-CM | POA: Insufficient documentation

## 2015-08-28 DIAGNOSIS — R05 Cough: Secondary | ICD-10-CM | POA: Diagnosis not present

## 2015-08-28 DIAGNOSIS — Z79899 Other long term (current) drug therapy: Secondary | ICD-10-CM | POA: Insufficient documentation

## 2015-08-28 DIAGNOSIS — E119 Type 2 diabetes mellitus without complications: Secondary | ICD-10-CM | POA: Insufficient documentation

## 2015-08-28 DIAGNOSIS — Z9889 Other specified postprocedural states: Secondary | ICD-10-CM | POA: Diagnosis not present

## 2015-08-28 DIAGNOSIS — Z8619 Personal history of other infectious and parasitic diseases: Secondary | ICD-10-CM | POA: Insufficient documentation

## 2015-08-28 DIAGNOSIS — J989 Respiratory disorder, unspecified: Secondary | ICD-10-CM | POA: Diagnosis not present

## 2015-08-28 DIAGNOSIS — R0602 Shortness of breath: Secondary | ICD-10-CM | POA: Diagnosis not present

## 2015-08-28 DIAGNOSIS — B9789 Other viral agents as the cause of diseases classified elsewhere: Secondary | ICD-10-CM

## 2015-08-28 DIAGNOSIS — J45901 Unspecified asthma with (acute) exacerbation: Secondary | ICD-10-CM

## 2015-08-28 DIAGNOSIS — Z87891 Personal history of nicotine dependence: Secondary | ICD-10-CM | POA: Insufficient documentation

## 2015-08-28 DIAGNOSIS — J988 Other specified respiratory disorders: Secondary | ICD-10-CM

## 2015-08-28 LAB — CBC WITH DIFFERENTIAL/PLATELET
Basophils Absolute: 0 10*3/uL (ref 0.0–0.1)
Basophils Relative: 0 %
EOS PCT: 2 %
Eosinophils Absolute: 0.3 10*3/uL (ref 0.0–0.7)
HCT: 44.7 % (ref 39.0–52.0)
Hemoglobin: 15 g/dL (ref 13.0–17.0)
LYMPHS ABS: 2.7 10*3/uL (ref 0.7–4.0)
LYMPHS PCT: 24 %
MCH: 30 pg (ref 26.0–34.0)
MCHC: 33.6 g/dL (ref 30.0–36.0)
MCV: 89.4 fL (ref 78.0–100.0)
MONO ABS: 1 10*3/uL (ref 0.1–1.0)
Monocytes Relative: 9 %
Neutro Abs: 7.2 10*3/uL (ref 1.7–7.7)
Neutrophils Relative %: 65 %
PLATELETS: 278 10*3/uL (ref 150–400)
RBC: 5 MIL/uL (ref 4.22–5.81)
RDW: 13.1 % (ref 11.5–15.5)
WBC: 11.2 10*3/uL — ABNORMAL HIGH (ref 4.0–10.5)

## 2015-08-28 LAB — BASIC METABOLIC PANEL
Anion gap: 9 (ref 5–15)
BUN: 14 mg/dL (ref 6–20)
CALCIUM: 9.3 mg/dL (ref 8.9–10.3)
CO2: 30 mmol/L (ref 22–32)
CREATININE: 0.95 mg/dL (ref 0.61–1.24)
Chloride: 98 mmol/L — ABNORMAL LOW (ref 101–111)
GFR calc Af Amer: >60 mL/min (ref >60)
GFR calc non Af Amer: >60 mL/min (ref >60)
GLUCOSE: 148 mg/dL — AB (ref 65–99)
Potassium: 3.9 mmol/L (ref 3.5–5.1)
Sodium: 137 mmol/L (ref 135–145)

## 2015-08-28 MED ORDER — PREDNISONE 10 MG PO TABS: 50.0000 mg | ORAL_TABLET | Freq: Every day | ORAL | Status: DC

## 2015-08-28 MED ORDER — IPRATROPIUM-ALBUTEROL 0.5-2.5 (3) MG/3ML IN SOLN
RESPIRATORY_TRACT | Status: AC
  Administered 2015-08-28: 3 mL
  Filled 2015-08-28: qty 3

## 2015-08-28 MED ORDER — IPRATROPIUM-ALBUTEROL 0.5-2.5 (3) MG/3ML IN SOLN
3.0000 mL | Freq: Once | RESPIRATORY_TRACT | Status: AC
  Administered 2015-08-28: 3 mL via RESPIRATORY_TRACT
  Filled 2015-08-28: qty 3

## 2015-08-28 MED ORDER — PREDNISONE 50 MG PO TABS
50.0000 mg | ORAL_TABLET | Freq: Once | ORAL | Status: AC
  Administered 2015-08-28: 50 mg via ORAL
  Filled 2015-08-28: qty 1

## 2015-08-28 MED ORDER — AZITHROMYCIN 250 MG PO TABS: 250.0000 mg | ORAL_TABLET | Freq: Every day | ORAL | Status: DC

## 2015-08-28 MED ORDER — ALBUTEROL SULFATE (2.5 MG/3ML) 0.083% IN NEBU
INHALATION_SOLUTION | RESPIRATORY_TRACT | Status: AC
  Administered 2015-08-28: 2.5 mg
  Filled 2015-08-28: qty 3

## 2015-08-28 MED FILL — predniSONE 10 MG TABS: 10 | 4 days supply | Qty: 20 | Fill #0

## 2015-08-28 MED FILL — AZITHROMYCIN 250 MG TABLET: 250 | 5 days supply | Qty: 6 | Fill #0

## 2015-08-28 NOTE — ED Provider Notes (Signed)
CSN: [id number]     Arrival date & time 08/28/15  1023 History   First MD Initiated Contact with Patient 08/28/15 1054     Chief Complaint  Patient presents with  . Shortness of Breath     (Consider location/radiation/quality/duration/timing/severity/associated sxs/prior Treatment) HPI 82 year old male who presents with one week of congestion, cough, and shortness of breath. Last night, was awake coughing. No fever, night sweats, or chills. Wheezing has been very bad according to his wife. Has been taking mucinex and inhaler, with minimal effect. Last needed steroids a year ago. No chest pain, edema. orthopnea, or PND.   Past Medical History  Diagnosis Date  . COPD with asthma ([place])   . CAD (coronary artery disease)     a. s/p MI 1992. b. Prior hx stenting to RCA/Cx. b. 05/2013: [place] s/p DES to distal RCA then staged DES to prox LAD.   . Diabetes mellitus   . Hyperlipidemia   . Allergic rhinitis   . Aortic dissection ([place])     f/u by cards - AAA with distal dissection.  . Shingles   . Lichen planus     Right midline inferior chest, Dr. [name] Sensing   Past Surgical History  Procedure Laterality Date  . Back surgery  1993  . Cardiac catheterization      left, w/ coronary angiography and left ventriculograpy  . Ptca      w/ placement of drugeluting stent in the distal right coronary artery, and distal Cardiologist: [name]  . Left heart catheterization with coronary angiogram N/A 06/09/2013    Procedure: LEFT HEART CATHETERIZATION WITH CORONARY ANGIOGRAM;  Surgeon: [name], MD;  Location: [facility] CATH LAB;  Service: Cardiovascular;  Laterality: N/A;  . Percutaneous coronary stent intervention (pci-s) N/A 06/12/2013    Procedure: PERCUTANEOUS CORONARY STENT INTERVENTION (PCI-S);  Surgeon: [name], MD;  Location: [facility] CATH LAB;  Service: Cardiovascular;  Laterality: N/A;  prox LAD   Family History  Problem Relation Age of Onset  . Prostate cancer Neg Hx   .  Colon cancer Neg Hx   . Asthma Mother   . Heart disease Mother     CHF  . Cancer Mother     melanoma  . Heart disease Father     MI   Social History  Substance Use Topics  . Smoking status: Former Smoker -- 1.50 packs/day for 39 years    Types: Cigarettes    Quit date: 08/24/1992  . Smokeless tobacco: Never Used  . Alcohol Use: No    Review of Systems 10/14 systems reviewed and are negative other than those stated in the HPI     Allergies  Review of patient's allergies indicates no known allergies.  Home Medications   Prior to Admission medications   Medication Sig Start Date End Date Taking? Authorizing Provider  acetaminophen (TYLENOL) 500 MG tablet Take 1,000 mg by mouth every 6 (six) hours as needed for pain.    Historical Provider, MD  albuterol (PROVENTIL HFA;VENTOLIN HFA) 108 (90 BASE) MCG/ACT inhaler Inhale 2 puffs into the lungs every 6 (six) hours as needed for wheezing or shortness of breath. 08/09/14   Mackie Pai, PA-C  aspirin 81 MG tablet Take 1 tablet (81 mg total) by mouth daily. 06/13/13   Dayna N Dunn, PA-C  azithromycin (ZITHROMAX Z-PAK) 250 MG tablet Take 1 tablet (250 mg total) by mouth daily. Take 2 tablets (500 mg) on day one, then take 1 tablet (250 mg)  daily on days 2-5 08/28/15   Forde Dandy, MD  clopidogrel (PLAVIX) 75 MG tablet Take 1 tablet (75 mg total) by mouth daily. 05/28/15   Colon Branch, MD  fluticasone [facility]) 50 MCG/ACT nasal spray Place 2 sprays into both nostrils daily. Patient not taking: Reported on 05/02/2015 08/09/14   Mackie Pai, PA-C  glucose blood (FREESTYLE TEST STRIPS) test strip Use as instructed Patient not taking: Reported on 05/02/2015 09/18/13   Colon Branch, MD  isosorbide mononitrate (IMDUR) 60 MG 24 hr tablet Take 1 tablet (60 mg total) by mouth 2 (two) times daily. 01/02/15   Colon Branch, MD  loratadine (CLARITIN) 10 MG tablet Take 10 mg by mouth daily as needed for allergies.     Historical Provider, MD  meclizine  (ANTIVERT) 12.5 MG tablet Take 1 tablet (12.5 mg total) by mouth 3 (three) times daily as needed for dizziness. Patient not taking: Reported on 05/02/2015 01/02/15   Colon Branch, MD  metFORMIN (GLUCOPHAGE) 500 MG tablet Take 1 tablet (500 mg total) by mouth 2 (two) times daily with a meal. 01/02/15   Colon Branch, MD  mometasone-formoterol [facility]) 200-5 MCG/ACT AERO Inhale 2 puffs into the lungs 2 (two) times daily. 01/23/14   Collene Gobble, MD  montelukast (SINGULAIR) 10 MG tablet Take 1 tablet (10 mg total) by mouth at bedtime. 01/02/15 01/02/16  Colon Branch, MD  Multiple Vitamin (MULTIVITAMIN WITH MINERALS) TABS tablet Take 1 tablet by mouth daily.    Historical Provider, MD  nitroGLYCERIN (NITROSTAT) 0.4 MG SL tablet Place 1 tablet (0.4 mg total) under the tongue every 5 (five) minutes as needed for chest pain (up to 3 doses only.). Patient not taking: Reported on 05/02/2015 04/15/15   [name], MD  pravastatin (PRAVACHOL) 40 MG tablet Take 1 tablet (40 mg total) by mouth daily. 01/02/15   Colon Branch, MD  predniSONE (DELTASONE) 10 MG tablet Take 5 tablets (50 mg total) by mouth daily. 08/29/15   Forde Dandy, MD  traMADol (ULTRAM) 50 MG tablet Take by mouth every 8 (eight) hours as needed for moderate pain or severe pain.    Historical Provider, MD   BP 136/66 mmHg  Pulse 76  Temp(Src) 98 F (36.7 C) (Oral)  Resp 18  Ht 5\' 9"  (1.753 m)  Wt 174 lb (78.926 kg)  BMI 25.68 kg/m2  SpO2 93% Physical Exam Physical Exam  Nursing note and vitals reviewed. Constitutional: Well developed, well nourished, non-toxic, and in no acute distress Head: Normocephalic and atraumatic.  Mouth/Throat: Oropharynx is clear and moist.  Neck: Normal range of motion. Neck supple.  Cardiovascular: Normal rate and regular rhythm.   Pulmonary/Chest: Effort normal,  No conversational dyspnea, there is moderate air movement with expiratory wheezes noted primarily at lung bases  Abdominal: Soft. There is no tenderness. There  is no rebound and no guarding.  Musculoskeletal: Normal range of motion.  Neurological: Alert, no facial droop, fluent speech, moves all extremities symmetrically Skin: Skin is warm and dry.  Psychiatric: Cooperative  ED Course  Procedures (including critical care time) Labs Review Labs Reviewed  CBC WITH DIFFERENTIAL/PLATELET - Abnormal; Notable for the following:    WBC 11.2 (*)    All other components within normal limits  BASIC METABOLIC PANEL - Abnormal; Notable for the following:    Chloride 98 (*)    Glucose, Bld 148 (*)    All other components within normal limits    Imaging  Review Dg Chest 2 View  08/28/2015  CLINICAL DATA:  COPD, cough, asthma, coronary artery disease, diabetes mellitus, former smoker EXAM: CHEST  2 VIEW COMPARISON:  08/09/2014 FINDINGS: Normal heart size, mediastinal contours, and pulmonary vascularity. Lungs emphysematous with chronic interstitial changes at the bases greater on LEFT likely representing fibrosis/scarring. No definite acute pulmonary infiltrate, pleural effusion, or pneumothorax. Osseous structures unremarkable. IMPRESSION: COPD changes with basilar fibrosis LEFT greater than RIGHT. No acute abnormalities. Electronically Signed   By: Lavonia Dhairya Corales M.D.   On: 08/28/2015 12:15   I have personally reviewed and evaluated these images and lab results as part of my medical decision-making.   EKG Interpretation   Date/Time:  Wednesday August 28 2015 11:43:07 EST Ventricular Rate:  86 PR Interval:  188 QRS Duration: 116 QT Interval:  396 QTC Calculation: 473 R Axis:   20 Text Interpretation:  Sinus rhythm with occasional Premature ventricular  complexes Incomplete right bundle branch block aside from PVCs not  significant change since prior Confirmed by Raymund Manrique MD, Philopateer Strine [id number]) on  08/28/2015 1:25:26 PM      MDM   Final diagnoses:  Acute exacerbation of COPD with asthma ([place])  Viral respiratory illness    82 year old male who presents with  sob in setting of URI symptoms. Well appearing and oxygenating well on room air on arrival. VS stable. No conversational dyspnea. Normal work of breathing. Lung sounds with diminished air movement and expiratory wheezes primarily at lung bases. Received one duoneb prior to my evaluation and given second breathing treatment with resolution of wheezing and improved air movement. Clinical presentation seem consistent with COPD exacerbation with URI. CXR without pneumonia, edema or other acute cardiopulmonary processes. Blood work unremarkable. Given steroids and z-pack for COPD. Appropriate for outpatient management. Strict return and follow-up instructions reviewed. He expressed understanding of all discharge instructions and felt comfortable with the plan of care.     Forde Dandy, MD 08/29/15 [phone]

## 2015-08-28 NOTE — ED Notes (Signed)
Patient has had SOB x 1 week, was supposed to go to MD today at 1 but they called the nurse and asked him to come here r/t possible Pneumonia.

## 2015-08-28 NOTE — Telephone Encounter (Signed)
Patient Name: Phillip Jordan  DOB: 15-Jan-1930    Initial Comment Caller states she is having shortness of breath; Asthma-appt was scheduled   Nurse Assessment  Nurse: Leilani Merl, RN, Heather Date/Time (Eastern Time): 08/28/2015 9:34:14 AM  Confirm and document reason for call. If symptomatic, describe symptoms. ---Caller states her husband is coughing a lot, he feels tight in his chest and diff breathing  Has the patient traveled out of the country within the last 30 days? ---Not Applicable  Does the patient have any new or worsening symptoms? ---Yes  Will a triage be completed? ---Yes  Related visit to physician within the last 2 weeks? ---No  Does the PT have any chronic conditions? (i.e. diabetes, asthma, etc.) ---Yes  List chronic conditions. ---COPD, asthma, heart problems  Is this a behavioral health or substance abuse call? ---No     Guidelines    Guideline Title Affirmed Question Affirmed Notes  Cough - Acute Non-Productive Difficulty breathing    Final Disposition User   Go to ED Now Standifer, RN, Nira Conn    Comments  Patient going to go to ED listed, cancelled 1 pm appt with Dr. Birdie Riddle in [facility].   Referrals  [facility] - ED   Disagree/Comply: Comply

## 2015-08-28 NOTE — Discharge Instructions (Signed)
Return for worsening symptoms, including difficulty breathing, chest pain, or any other symptoms concerning to you.  Take meds as prescribed.   Chronic Obstructive Pulmonary Disease Exacerbation Chronic obstructive pulmonary disease (COPD) is a common lung condition in which airflow from the lungs is limited. COPD is a general term that can be used to describe many different lung problems that limit airflow, including chronic bronchitis and emphysema. COPD exacerbations are episodes when breathing symptoms become much worse and require extra treatment. Without treatment, COPD exacerbations can be life threatening, and frequent COPD exacerbations can cause further damage to your lungs. CAUSES  Respiratory infections.  Exposure to smoke.  Exposure to air pollution, chemical fumes, or dust. Sometimes there is no apparent cause or trigger. RISK FACTORS  Smoking cigarettes.  Older age.  Frequent prior COPD exacerbations. SIGNS AND SYMPTOMS  Increased coughing.  Increased thick spit (sputum) production.  Increased wheezing.  Increased shortness of breath.  Rapid breathing.  Chest tightness. DIAGNOSIS Your medical history, a physical exam, and tests will help your health care provider make a diagnosis. Tests may include:  A chest X-ray.  Basic lab tests.  Sputum testing.  An arterial blood gas test. TREATMENT Depending on the severity of your COPD exacerbation, you may need to be admitted to a hospital for treatment. Some of the treatments commonly used to treat COPD exacerbations are:   Antibiotic medicines.  Bronchodilators. These are drugs that expand the air passages. They may be given with an inhaler or nebulizer. Spacer devices may be needed to help improve drug delivery.  Corticosteroid medicines.  Supplemental oxygen therapy.  Airway clearing techniques, such as noninvasive ventilation (NIV) and positive expiratory pressure (PEP). These provide respiratory  support through a mask or other noninvasive device. HOME CARE INSTRUCTIONS  Do not smoke. Quitting smoking is very important to prevent COPD from getting worse and exacerbations from happening as often.  Avoid exposure to all substances that irritate the airway, especially to tobacco smoke.  If you were prescribed an antibiotic medicine, finish it all even if you start to feel better.  Take all medicines as directed by your health care provider.It is important to use correct technique with inhaled medicines.  Drink enough fluids to keep your urine clear or pale yellow (unless you have a medical condition that requires fluid restriction).  Use a cool mist vaporizer. This makes it easier to clear your chest when you cough.  If you have a home nebulizer and oxygen, continue to use them as directed.  Maintain all necessary vaccinations to prevent infections.  Exercise regularly.  Eat a healthy diet.  Keep all follow-up appointments as directed by your health care provider. SEEK IMMEDIATE MEDICAL CARE IF:  You have worsening shortness of breath.  You have trouble talking.  You have severe chest pain.  You have blood in your sputum.  You have a fever.  You have weakness, vomit repeatedly, or faint.  You feel confused.  You continue to get worse. MAKE SURE YOU:  Understand these instructions.  Will watch your condition.  Will get help right away if you are not doing well or get worse.   This information is not intended to replace advice given to you by your health care provider. Make sure you discuss any questions you have with your health care provider.   Document Released: 06/07/2007 Document Revised: 08/31/2014 Document Reviewed: 04/14/2013 Elsevier Interactive Patient Education 2016 Elsevier Inc.  Viral Infections A viral infection can be caused by different types  of viruses.Most viral infections are not serious and resolve on their own. However, some infections  may cause severe symptoms and may lead to further complications. SYMPTOMS Viruses can frequently cause:  Minor sore throat.  Aches and pains.  Headaches.  Runny nose.  Different types of rashes.  Watery eyes.  Tiredness.  Cough.  Loss of appetite.  Gastrointestinal infections, resulting in nausea, vomiting, and diarrhea. These symptoms do not respond to antibiotics because the infection is not caused by bacteria. However, you might catch a bacterial infection following the viral infection. This is sometimes called a "superinfection." Symptoms of such a bacterial infection may include:  Worsening sore throat with pus and difficulty swallowing.  Swollen neck glands.  Chills and a high or persistent fever.  Severe headache.  Tenderness over the sinuses.  Persistent overall ill feeling (malaise), muscle aches, and tiredness (fatigue).  Persistent cough.  Yellow, green, or brown mucus production with coughing. HOME CARE INSTRUCTIONS   Only take over-the-counter or prescription medicines for pain, discomfort, diarrhea, or fever as directed by your caregiver.  Drink enough water and fluids to keep your urine clear or pale yellow. Sports drinks can provide valuable electrolytes, sugars, and hydration.  Get plenty of rest and maintain proper nutrition. Soups and broths with crackers or rice are fine. SEEK IMMEDIATE MEDICAL CARE IF:   You have severe headaches, shortness of breath, chest pain, neck pain, or an unusual rash.  You have uncontrolled vomiting, diarrhea, or you are unable to keep down fluids.  You or your child has an oral temperature above 102 F (38.9 C), not controlled by medicine.  Your baby is older than 3 months with a rectal temperature of 102 F (38.9 C) or higher.  Your baby is 81 months old or younger with a rectal temperature of 100.4 F (38 C) or higher. MAKE SURE YOU:   Understand these instructions.  Will watch your condition.  Will  get help right away if you are not doing well or get worse.   This information is not intended to replace advice given to you by your health care provider. Make sure you discuss any questions you have with your health care provider.   Document Released: 05/20/2005 Document Revised: 11/02/2011 Document Reviewed: 01/16/2015 Elsevier Interactive Patient Education [employer].

## 2015-08-30 ENCOUNTER — Encounter: Payer: Self-pay | Admitting: Internal Medicine

## 2015-08-30 ENCOUNTER — Ambulatory Visit (INDEPENDENT_AMBULATORY_CARE_PROVIDER_SITE_OTHER): Payer: PPO | Admitting: Internal Medicine

## 2015-08-30 VITALS — BP 122/56 | HR 67 | Temp 98.1°F | Ht 69.0 in | Wt 173.1 lb

## 2015-08-30 DIAGNOSIS — J441 Chronic obstructive pulmonary disease with (acute) exacerbation: Secondary | ICD-10-CM | POA: Diagnosis not present

## 2015-08-30 DIAGNOSIS — E119 Type 2 diabetes mellitus without complications: Secondary | ICD-10-CM | POA: Diagnosis not present

## 2015-08-30 LAB — GLUCOSE, POCT (MANUAL RESULT ENTRY): POC Glucose: 137 mg/dl — AB (ref 70–99)

## 2015-08-30 NOTE — Progress Notes (Signed)
Pre visit review using our clinic review tool, if applicable. No additional management support is needed unless otherwise documented below in the visit note. 

## 2015-08-30 NOTE — Patient Instructions (Signed)
Continue taking prednisone and Zithromax as prescribed  Continue with the inhalers  Use Mucinex OTC as needed for cough and phlegm production  Call if your improvement does not continue or if you get worse.

## 2015-08-30 NOTE — Progress Notes (Signed)
Subjective:    Patient ID: Phillip Jordan, male    DOB: 02-Oct-1929, 82 y.o.   MRN: [id number]  DOS:  08/30/2015 Type of visit - description : ER follow-up Interval history: Martin Majestic to the ER 08/28/2015 with one-week history of respiratory symptoms including wheezing, cough Chest x-ray: COPD, no acute changes. EKG unchanged from previous. BMP normal CBC with a white count of 11.2, not far from baseline Prescribed steroids - Z-Pak.  Review of Systems Since he left the ER he is feeling better.  Good compliance with medications. No recent blood sugar test. No fever chills Difficulty breathing decreased Cough, wheezing and sputum production decreased. No hemoptysis. Still has sinus congestion but that also has improved.  Past Medical History  Diagnosis Date  . COPD with asthma ([place])   . CAD (coronary artery disease)     a. s/p MI 1992. b. Prior hx stenting to RCA/Cx. b. 05/2013: [place] s/p DES to distal RCA then staged DES to prox LAD.   . Diabetes mellitus   . Hyperlipidemia   . Allergic rhinitis   . Aortic dissection ([place])     f/u by cards - AAA with distal dissection.  . Shingles   . Lichen planus     Right midline inferior chest, Dr. [name] Sensing    Past Surgical History  Procedure Laterality Date  . Back surgery  1993  . Cardiac catheterization      left, w/ coronary angiography and left ventriculograpy  . Ptca      w/ placement of drugeluting stent in the distal right coronary artery, and distal Cardiologist: [name]  . Left heart catheterization with coronary angiogram N/A 06/09/2013    Procedure: LEFT HEART CATHETERIZATION WITH CORONARY ANGIOGRAM;  Surgeon: [name], MD;  Location: [facility] CATH LAB;  Service: Cardiovascular;  Laterality: N/A;  . Percutaneous coronary stent intervention (pci-s) N/A 06/12/2013    Procedure: PERCUTANEOUS CORONARY STENT INTERVENTION (PCI-S);  Surgeon: [name], MD;  Location: [facility] CATH LAB;  Service: Cardiovascular;   Laterality: N/A;  prox LAD    Social History   Social History  . Marital Status: Married    Spouse Name: N/A  . Number of Children: 3  . Years of Education: N/A   Occupational History  . retired    Social History Main Topics  . Smoking status: Former Smoker -- 1.50 packs/day for 39 years    Types: Cigarettes    Quit date: 08/24/1992  . Smokeless tobacco: Never Used  . Alcohol Use: No  . Drug Use: No  . Sexual Activity: Not on file   Other Topics Concern  . Not on file   Social History Narrative   Lives w/ wife, does local mission work  .   3 kids, 11 [id number]                     Medication List       This list is accurate as of: 08/30/15 11:59 PM.  Always use your most recent med list.               acetaminophen 500 MG tablet  Commonly known as:  TYLENOL  Take 1,000 mg by mouth every 6 (six) hours as needed for pain. Reported on 08/30/2015     albuterol 108 (90 Base) MCG/ACT inhaler  Commonly known as:  PROVENTIL HFA;VENTOLIN HFA  Inhale 2 puffs into the lungs every 6 (six) hours as needed for wheezing or shortness of  breath.     aspirin 81 MG tablet  Take 1 tablet (81 mg total) by mouth daily.     azithromycin 250 MG tablet  Commonly known as:  ZITHROMAX Z-PAK  Take 1 tablet (250 mg total) by mouth daily. Take 2 tablets (500 mg) on day one, then take 1 tablet (250 mg) daily on days 2-5     clopidogrel 75 MG tablet  Commonly known as:  PLAVIX  Take 1 tablet (75 mg total) by mouth daily.     fluticasone 50 MCG/ACT nasal spray  Commonly known as:  FLONASE  Place 2 sprays into both nostrils daily.     glucose blood test strip  Commonly known as:  FREESTYLE TEST STRIPS  Use as instructed     isosorbide mononitrate 60 MG 24 hr tablet  Commonly known as:  IMDUR  Take 1 tablet (60 mg total) by mouth 2 (two) times daily.     loratadine 10 MG tablet  Commonly known as:  CLARITIN  Take 10 mg by mouth daily as needed for allergies.     meclizine 12.5  MG tablet  Commonly known as:  ANTIVERT  Take 1 tablet (12.5 mg total) by mouth 3 (three) times daily as needed for dizziness.     metFORMIN 500 MG tablet  Commonly known as:  GLUCOPHAGE  Take 1 tablet (500 mg total) by mouth 2 (two) times daily with a meal.     mometasone-formoterol 200-5 MCG/ACT Aero  Commonly known as:  DULERA  Inhale 2 puffs into the lungs 2 (two) times daily.     montelukast 10 MG tablet  Commonly known as:  SINGULAIR  Take 1 tablet (10 mg total) by mouth at bedtime.     multivitamin with minerals Tabs tablet  Take 1 tablet by mouth daily.     nitroGLYCERIN 0.4 MG SL tablet  Commonly known as:  NITROSTAT  Place 1 tablet (0.4 mg total) under the tongue every 5 (five) minutes as needed for chest pain (up to 3 doses only.).     pravastatin 40 MG tablet  Commonly known as:  PRAVACHOL  Take 1 tablet (40 mg total) by mouth daily.     predniSONE 10 MG tablet  Commonly known as:  DELTASONE  Take 5 tablets (50 mg total) by mouth daily.     traMADol 50 MG tablet  Commonly known as:  ULTRAM  Take by mouth every 8 (eight) hours as needed for moderate pain or severe pain.           Objective:   Physical Exam BP 122/56 mmHg  Pulse 67  Temp(Src) 98.1 F (36.7 C) (Oral)  Ht 5\' 9"  (1.753 m)  Wt 173 lb 2 oz (78.529 kg)  BMI 25.55 kg/m2  SpO2 96% General:   Well developed, well nourished . NAD.  HEENT:  Normocephalic . Face symmetric, atraumatic. Nose not congested, sinuses no TTP Lungs:  We are except for mild end expiratory wheezing. Normal respiratory effort, no intercostal retractions, no accessory muscle use. Heart: RRR,  no murmur.  No pretibial edema bilaterally  Skin: Not pale. Not jaundice Neurologic:  alert & oriented X3.  Speech normal, gait appropriate for age and unassisted Psych--  Cognition and judgment appear intact.  Cooperative with normal attention span and concentration.  Behavior appropriate. No anxious or depressed appearing.       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 Ronnald Ramp Chronic hoarseness, status post ENT eval ~ 2012 Normal DEXA 2012  PLAN COPD exacerbation: Improving. See instructions. DM: on steroids, CBG today 127. RTC as scheduled in few days for a CPX

## 2015-09-06 ENCOUNTER — Encounter: Payer: Self-pay | Admitting: Medical

## 2015-09-06 ENCOUNTER — Ambulatory Visit (INDEPENDENT_AMBULATORY_CARE_PROVIDER_SITE_OTHER): Payer: PPO | Admitting: Medical

## 2015-09-06 VITALS — BP 122/68 | HR 71 | Temp 97.4°F | Ht 69.0 in | Wt 173.0 lb

## 2015-09-06 DIAGNOSIS — H6123 Impacted cerumen, bilateral: Secondary | ICD-10-CM

## 2015-09-06 NOTE — Progress Notes (Signed)
Subjective:    Patient ID: Phillip Jordan, male    DOB: 1929/10/20, 82 y.o.   MRN: [id number]  HPI   Pt in with some rt ear clogged sensation. Pt seen last visit. He states his copd type symptoms which he   are  now resolved. But over last week clogged ear sensation. No fever, no chills and no sweats.   Review of Systems  Constitutional: Negative for fever, chills and fatigue.  HENT: Negative for congestion, dental problem, ear discharge, postnasal drip and sore throat.        Ear blocked sensation.  Respiratory: Negative for cough, chest tightness, shortness of breath and wheezing.   Cardiovascular: Negative for chest pain and palpitations.  Neurological: Negative for dizziness, speech difficulty, weakness, light-headedness and headaches.  Hematological: Negative for adenopathy. Does not bruise/bleed easily.  Psychiatric/Behavioral: Negative for behavioral problems and confusion.    Past Medical History  Diagnosis Date  . COPD with asthma ([place])   . CAD (coronary artery disease)     a. s/p MI 1992. b. Prior hx stenting to RCA/Cx. b. 05/2013: [place] s/p DES to distal RCA then staged DES to prox LAD.   . Diabetes mellitus   . Hyperlipidemia   . Allergic rhinitis   . Aortic dissection ([place])     f/u by cards - AAA with distal dissection.  . Shingles   . Lichen planus     Right midline inferior chest, Dr. [name] Sensing    Social History   Social History  . Marital Status: Married    Spouse Name: N/A  . Number of Children: 3  . Years of Education: N/A   Occupational History  . retired    Social History Main Topics  . Smoking status: Former Smoker -- 1.50 packs/day for 39 years    Types: Cigarettes    Quit date: 08/24/1992  . Smokeless tobacco: Never Used  . Alcohol Use: No  . Drug Use: No  . Sexual Activity: Not on file   Other Topics Concern  . Not on file   Social History Narrative   Lives w/ wife, does local mission work  .   3 kids, 11 Gk-GGK                 Past Surgical History  Procedure Laterality Date  . Back surgery  1993  . Cardiac catheterization      left, w/ coronary angiography and left ventriculograpy  . Ptca      w/ placement of drugeluting stent in the distal right coronary artery, and distal Cardiologist: [name]  . Left heart catheterization with coronary angiogram N/A 06/09/2013    Procedure: LEFT HEART CATHETERIZATION WITH CORONARY ANGIOGRAM;  Surgeon: [name], MD;  Location[facility] C[facility] LAB;  Service: Cardiovascular;  Laterality: N/A;  . Percutaneous coronary stent intervention (pci-s) N/A 06/12/2013    Procedure: PERCUTANEOUS CORONARY STENT INTERVENTION (PCI-S);  Surgeon: [name], MD;  Location: [facility] CATH LAB;  Service: Cardiovascular;  Laterality: N/A;  prox LAD    Family History  Problem Relation Age of Onset  . Prostate cancer Neg Hx   . Colon cancer Neg Hx   . Asthma Mother   . Heart disease Mother     CHF  . Cancer Mother     melanoma  . Heart disease Father     MI    No Known Allergies  Current Outpatient Prescriptions on File Prior to Visit  Medication Sig Dispense Refill  .  acetaminophen (TYLENOL) 500 MG tablet Take 1,000 mg by mouth every 6 (six) hours as needed for pain. Reported on 08/30/2015    . albuterol (PROVENTIL HFA;VENTOLIN HFA) 108 (90 BASE) MCG/ACT inhaler Inhale 2 puffs into the lungs every 6 (six) hours as needed for wheezing or shortness of breath. 1 Inhaler 0  . aspirin 81 MG tablet Take 1 tablet (81 mg total) by mouth daily.    . clopidogrel (PLAVIX) 75 MG tablet Take 1 tablet (75 mg total) by mouth daily. 90 tablet 1  . glucose blood (FREESTYLE TEST STRIPS) test strip Use as instructed 100 each 12  . isosorbide mononitrate (IMDUR) 60 MG 24 hr tablet Take 1 tablet (60 mg total) by mouth 2 (two) times daily. 180 tablet 2  . loratadine (CLARITIN) 10 MG tablet Take 10 mg by mouth daily as needed for allergies.     [name] meclizine (ANTIVERT) 12.5 MG tablet Take 1  tablet (12.5 mg total) by mouth 3 (three) times daily as needed for dizziness. 90 tablet 0  . metFORMIN (GLUCOPHAGE) 500 MG tablet Take 1 tablet (500 mg total) by mouth 2 (two) times daily with a meal. 180 tablet 2  . mometasone-formoterol (DULERA) 200-5 MCG/ACT AERO Inhale 2 puffs into the lungs 2 (two) times daily. 1 Inhaler 0  . montelukast (SINGULAIR) 10 MG tablet Take 1 tablet (10 mg total) by mouth at bedtime. 90 tablet 2  . Multiple Vitamin (MULTIVITAMIN WITH MINERALS) TABS tablet Take 1 tablet by mouth daily.    . nitroGLYCERIN (NITROSTAT) 0.4 MG SL tablet Place 1 tablet (0.4 mg total) under the tongue every 5 (five) minutes as needed for chest pain (up to 3 doses only.). 25 tablet 3  . pravastatin (PRAVACHOL) 40 MG tablet Take 1 tablet (40 mg total) by mouth daily. 90 tablet 2  . traMADol (ULTRAM) 50 MG tablet Take by mouth every 8 (eight) hours as needed for moderate pain or severe pain.     No current facility-administered medications on file prior to visit.    BP 122/68 mmHg  Pulse 71  Temp(Src) 97.4 F (36.3 C) (Oral)  Ht 5\' 9"  (1.753 m)  Wt 173 lb (78.472 kg)  BMI 25.54 kg/m2  SpO2 98%       Objective:   Physical Exam  General  Mental Status - Alert. General Appearance - Well groomed. Not in acute distress.  Skin Rashes- No Rashes.  HEENT Head- Normal. Ear Auditory Canal - Left- wax partial blockage on both sides Right - wax partial blockage on both sides.Tympanic Membrane- Left- Normal. Right- Normal.(post lavage canals are clear) Eye Sclera/Conjunctiva- Left- Normal. Right- Normal. Nose & Sinuses Nasal Mucosa- Left-  Not boggy or Congested. Right-  Not boggy or Congested. Mouth & Throat Lips: Upper Lip- Normal: no dryness, cracking, pallor, cyanosis, or vesicular eruption. Lower Lip-Normal: no dryness, cracking, pallor, cyanosis or vesicular eruption. Buccal Mucosa- Bilateral- No Aphthous ulcers. Oropharynx- No Discharge or Erythema. Tonsils:  Characteristics- Bilateral- No Erythema or Congestion. Size/Enlargement- Bilateral- No enlargement. Discharge- bilateral-None.  Neck Neck- Supple. No Masses.   Chest and Lung Exam Auscultation: Breath Sounds:- even and unlabored  Cardiovascular Auscultation:Rythm- Regular, rate and rhythm. Murmurs & Other Heart Sounds:Ausculatation of the heart reveal- No Murmurs.  Lymphatic Head & Neck General Head & Neck Lymphatics: Bilateral: Description- No Localized lymphadenopathy.       Assessment & Plan:  Your ear wax is completely cleared. In future if you feel recurrence can use debrox otc then come in  for washing out of the canal.

## 2015-09-06 NOTE — Patient Instructions (Signed)
Your ear wax is completely cleared. In future if you feel recurrence can use debrox otc then come in for washing out of the canal.

## 2015-09-06 NOTE — Progress Notes (Signed)
Pre visit review using our clinic review tool, if applicable. No additional management support is needed unless otherwise documented below in the visit note. 

## 2015-09-10 ENCOUNTER — Ambulatory Visit (INDEPENDENT_AMBULATORY_CARE_PROVIDER_SITE_OTHER): Payer: PPO | Admitting: Internal Medicine

## 2015-09-10 ENCOUNTER — Encounter: Payer: Self-pay | Admitting: Internal Medicine

## 2015-09-10 VITALS — BP 128/76 | HR 66 | Temp 97.8°F | Ht 69.0 in | Wt 172.2 lb

## 2015-09-10 DIAGNOSIS — R5383 Other fatigue: Secondary | ICD-10-CM | POA: Diagnosis not present

## 2015-09-10 DIAGNOSIS — Z0000 Encounter for general adult medical examination without abnormal findings: Secondary | ICD-10-CM

## 2015-09-10 DIAGNOSIS — Z0001 Encounter for general adult medical examination with abnormal findings: Secondary | ICD-10-CM

## 2015-09-10 DIAGNOSIS — Z23 Encounter for immunization: Secondary | ICD-10-CM | POA: Diagnosis not present

## 2015-09-10 DIAGNOSIS — E119 Type 2 diabetes mellitus without complications: Secondary | ICD-10-CM

## 2015-09-10 DIAGNOSIS — E785 Hyperlipidemia, unspecified: Secondary | ICD-10-CM

## 2015-09-10 NOTE — Progress Notes (Signed)
Pre visit review using our clinic review tool, if applicable. No additional management support is needed unless otherwise documented below in the visit note. 

## 2015-09-10 NOTE — Assessment & Plan Note (Addendum)
Td 2007 ;pneumonia shot x 3 , last 05-2012; prevnar: 08-2015 zostavax - 2009 Had a flu    Cscope 2007, TICS, no polyps (Dr Sharlett Iles). Due for a colonoscopy, pros and cons discussed, we also discussed a cologuard. Patient is not inclined to proceed w/ a screening this year, given age I agree however we'll discuss next year.  prostate cancer screening-- we agreed to stop screenings, see previous notes Diet and exercise discussed

## 2015-09-10 NOTE — Progress Notes (Signed)
Subjective:    Patient ID: Phillip Jordan, male    DOB: May 14, 1930, 82 y.o.   MRN: [id number]  DOS:  09/10/2015 Type of visit - description : CPX Interval history: In general feeling well, no major concerns COPD asthma: Good compliance w/ medication, recent exacerbation improved; under normal circumstances rarely uses albuterol as a rescue inhaler. DM: On metformin, no apparent side effects Hyperlipidemia: on statins, denies any side effects such as muscle aches   Reports lower stamina than before for the last 6 months: Denies orthopnea, depression, headaches, fever, weight loss, myalgias. No actual DOE   Review of Systems  Constitutional: No fever. No chills. No unexplained wt changes. Occasional nocturnal sweats for the last 2 weeks.  HEENT: No dental problems, no ear discharge, no facial swelling, no voice changes. No eye discharge, no eye  redness , no  intolerance to light   Respiratory: No wheezing , no  difficulty breathing. No cough , no mucus production  Cardiovascular: No CP, no leg swelling , no  Palpitations  GI: no nausea, no vomiting, no diarrhea , no  abdominal pain.  No blood in the stools. No dysphagia, no odynophagia    Endocrine: No polyphagia, no polyuria , no polydipsia  GU: No dysuria, gross hematuria, difficulty urinating. No urinary urgency, no frequency.  Musculoskeletal: No joint swellings or unusual aches or pains  Skin: No change in the color of the skin, palor , no  Rash  Allergic, immunologic: No environmental allergies , no  food allergies  Neurological: No dizziness no  syncope. No headaches. No diplopia, no slurred, no slurred speech, no motor deficits, no facial  Numbness  Hematological: No enlarged lymph nodes, no easy bruising , no unusual bleedings  Psychiatry: No suicidal ideas, no hallucinations, no beavior problems, no confusion.  No unusual/severe anxiety, no depression   Past Medical History  Diagnosis Date  . COPD with  asthma ([place])   . CAD (coronary artery disease)     a. s/p MI 1992. b. Prior hx stenting to RCA/Cx. b. 05/2013: [place] s/p DES to distal RCA then staged DES to prox LAD.   . Diabetes mellitus   . Hyperlipidemia   . Allergic rhinitis   . Aortic dissection ([place])     f/u by cards - AAA with distal dissection.  . Shingles   . Lichen planus     Right midline inferior chest, Dr. [name] Sensing    Past Surgical History  Procedure Laterality Date  . Back surgery  1993  . Cardiac catheterization      left, w/ coronary angiography and left ventriculograpy  . Ptca      w/ placement of drugeluting stent in the distal right coronary artery, and distal Cardiologist: [name]  . Left heart catheterization with coronary angiogram N/A 06/09/2013    Procedure: LEFT HEART CATHETERIZATION WITH CORONARY ANGIOGRAM;  Surgeon: [name], MD;  Location: [facility] CATH LAB;  Service: Cardiovascular;  Laterality: N/A;  . Percutaneous coronary stent intervention (pci-s) N/A 06/12/2013    Procedure: PERCUTANEOUS CORONARY STENT INTERVENTION (PCI-S);  Surgeon: [name], MD;  Location: [facility] CATH LAB;  Service: Cardiovascular;  Laterality: N/A;  prox LAD    Social History   Social History  . Marital Status: Married    Spouse Name: N/A  . Number of Children: 3  . Years of Education: N/A   Occupational History  . retired    Social History Main Topics  . Smoking status: Former Smoker --  1.50 packs/day for 39 years    Types: Cigarettes    Quit date: 08/24/1992  . Smokeless tobacco: Never Used  . Alcohol Use: No  . Drug Use: No  . Sexual Activity: Not on file   Other Topics Concern  . Not on file   Social History Narrative   Lives w/ wife, does do some  local mission work  .   3 kids, 82 Gk-GGK                  Family History  Problem Relation Age of Onset  . Prostate cancer Neg Hx   . Colon cancer Neg Hx   . Asthma Mother   . Heart disease Mother     CHF  . Cancer Mother      melanoma  . Heart disease Father     MI       Medication List       This list is accurate as of: 09/10/15 11:59 PM.  Always use your most recent med list.               acetaminophen 500 MG tablet  Commonly known as:  TYLENOL  Take 1,000 mg by mouth every 6 (six) hours as needed for pain. Reported on 08/30/2015     albuterol 108 (90 Base) MCG/ACT inhaler  Commonly known as:  PROVENTIL HFA;VENTOLIN HFA  Inhale 2 puffs into the lungs every 6 (six) hours as needed for wheezing or shortness of breath.     aspirin 81 MG tablet  Take 1 tablet (81 mg total) by mouth daily.     clopidogrel 75 MG tablet  Commonly known as:  PLAVIX  Take 1 tablet (75 mg total) by mouth daily.     glucose blood test strip  Commonly known as:  FREESTYLE TEST STRIPS  Use as instructed     isosorbide mononitrate 60 MG 24 hr tablet  Commonly known as:  IMDUR  Take 1 tablet (60 mg total) by mouth 2 (two) times daily.     loratadine 10 MG tablet  Commonly known as:  CLARITIN  Take 10 mg by mouth daily as needed for allergies.     meclizine 12.5 MG tablet  Commonly known as:  ANTIVERT  Take 1 tablet (12.5 mg total) by mouth 3 (three) times daily as needed for dizziness.     metFORMIN 500 MG tablet  Commonly known as:  GLUCOPHAGE  Take 1 tablet (500 mg total) by mouth 2 (two) times daily with a meal.     mometasone-formoterol 200-5 MCG/ACT Aero  Commonly known as:  DULERA  Inhale 2 puffs into the lungs 2 (two) times daily.     montelukast 10 MG tablet  Commonly known as:  SINGULAIR  Take 1 tablet (10 mg total) by mouth at bedtime.     multivitamin with minerals Tabs tablet  Take 1 tablet by mouth daily.     nitroGLYCERIN 0.4 MG SL tablet  Commonly known as:  NITROSTAT  Place 1 tablet (0.4 mg total) under the tongue every 5 (five) minutes as needed for chest pain (up to 3 doses only.).     pravastatin 40 MG tablet  Commonly known as:  PRAVACHOL  Take 1 tablet (40 mg total) by mouth daily.      traMADol 50 MG tablet  Commonly known as:  ULTRAM  Take by mouth every 8 (eight) hours as needed for moderate pain or severe pain.  Objective:   Physical Exam BP 128/76 mmHg  Pulse 66  Temp(Src) 97.8 F (36.6 C) (Oral)  Ht 5\' 9"  (1.753 m)  Wt 172 lb 4 oz (78.132 kg)  BMI 25.43 kg/m2  SpO2 98% General:   Well developed, well nourished . NAD.  Neck:   No  thyromegaly , normal carotid pulse HEENT:  Normocephalic . Face symmetric, atraumatic Lungs:  CTA B Normal respiratory effort, no intercostal retractions, no accessory muscle use. Heart: RRR,  no murmur.  No pretibial edema bilaterally  Abdomen:  Not distended, soft, non-tender. No rebound or rigidity. No bruit  Skin: Exposed areas without rash. Not pale. Not jaundice Neurologic:  alert & oriented X3.  Speech normal, gait appropriate for age and unassisted Strength symmetric and appropriate for age.  Psych: Cognition and judgment appear intact.  Cooperative with normal attention span and concentration.  Behavior appropriate. No anxious or depressed appearing.    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 Ronnald Ramp Chronic hoarseness, status post ENT eval ~ 2012 Normal DEXA 2012  PLAN DM: Check A1c. Continue with present care Hyperlipidemia: Continue statins, check FLP, AST, ALT CAD: Currently asymptomatic. COPD, asthma: After recent exacerbation, he is now back to normal, continue with present care Fatigue, decreased stamina: Review of systems negative, will check a TSH, recent hemoglobin normal. For now recommend observation RTC 6 months

## 2015-09-10 NOTE — Patient Instructions (Addendum)
BEFORE YOU LEAVE THE OFFICE:  GO TO THE FRONT DESK Schedule labs to be done within few days (fasting)  Schedule a routine office visit or check up to be done in  6 months  no fasting   Also, please schedule a Medicare wellness exam with one of our nurses.

## 2015-09-11 LAB — AST: AST: 20 U/L (ref 0–37)

## 2015-09-11 LAB — TSH: TSH: 2.17 u[IU]/mL (ref 0.35–4.50)

## 2015-09-11 LAB — LIPID PANEL
CHOL/HDL RATIO: 4
Cholesterol: 170 mg/dL (ref 0–200)
HDL: 39.8 mg/dL (ref >39.00)
LDL CALC: 93 mg/dL (ref 0–99)
NonHDL: 129.92
Triglycerides: 183 mg/dL — ABNORMAL HIGH (ref 0.0–149.0)
VLDL: 36.6 mg/dL (ref 0.0–40.0)

## 2015-09-11 LAB — HEMOGLOBIN A1C: HEMOGLOBIN A1C: 6.6 % — AB (ref 4.6–6.5)

## 2015-09-11 LAB — ALT: ALT: 22 U/L (ref 0–53)

## 2015-10-01 ENCOUNTER — Ambulatory Visit: Payer: PPO | Admitting: Podiatry

## 2015-10-04 ENCOUNTER — Ambulatory Visit (INDEPENDENT_AMBULATORY_CARE_PROVIDER_SITE_OTHER): Payer: PPO | Admitting: Podiatry

## 2015-10-04 ENCOUNTER — Encounter: Payer: Self-pay | Admitting: Podiatry

## 2015-10-04 DIAGNOSIS — B351 Tinea unguium: Secondary | ICD-10-CM

## 2015-10-04 DIAGNOSIS — E119 Type 2 diabetes mellitus without complications: Secondary | ICD-10-CM | POA: Diagnosis not present

## 2015-10-04 DIAGNOSIS — M79676 Pain in unspecified toe(s): Secondary | ICD-10-CM | POA: Diagnosis not present

## 2015-10-04 NOTE — Progress Notes (Signed)
Patient ID: Phillip Jordan, male   DOB: 1929/09/13, 82 y.o.   MRN: [id number] Complaint:  Visit Type: Patient returns to my office for continued preventative foot care services. Complaint: Patient states" my nails have grown long and thick and become painful to walk and wear shoes" Patient has been diagnosed with DM with no foot complications. The patient presents for preventative foot care services. No changes to ROS  Podiatric Exam: Vascular: dorsalis pedis and posterior tibial pulses are palpable bilateral. Capillary return is immediate. Temperature gradient is WNL. Skin turgor WNL  Sensorium: Normal Semmes Weinstein monofilament test except toes both feet.. Normal tactile sensation bilaterally. Nail Exam: Pt has thick disfigured discolored nails with subungual debris noted bilateral entire nail hallux through fifth toenails Ulcer Exam: There is no evidence of ulcer or pre-ulcerative changes or infection. Orthopedic Exam: Muscle tone and strength are WNL. No limitations in general ROM. No crepitus or effusions noted. Foot type and digits show no abnormalities. Bony prominences are unremarkable. Skin: No Porokeratosis. No infection or ulcers  Diagnosis:  Onychomycosis, , Pain in right toe, pain in left toes  Treatment & Plan Procedures and Treatment: Consent by patient was obtained for treatment procedures. The patient understood the discussion of treatment and procedures well. All questions were answered thoroughly reviewed. Debridement of mycotic and hypertrophic toenails, 1 through 5 bilateral and clearing of subungual debris. No ulceration, no infection noted.  Return Visit-Office Procedure: Patient instructed to return to the office for a follow up visit 3 months for continued evaluation and treatment.

## 2015-10-08 ENCOUNTER — Encounter: Payer: Self-pay | Admitting: Internal Medicine

## 2015-10-08 ENCOUNTER — Ambulatory Visit (INDEPENDENT_AMBULATORY_CARE_PROVIDER_SITE_OTHER): Payer: PPO | Admitting: Internal Medicine

## 2015-10-08 VITALS — BP 138/82 | HR 79 | Ht 69.0 in | Wt 176.0 lb

## 2015-10-08 DIAGNOSIS — J449 Chronic obstructive pulmonary disease, unspecified: Secondary | ICD-10-CM

## 2015-10-08 MED ORDER — FAMOTIDINE 20 MG PO TABS: ORAL_TABLET | ORAL | Status: DC

## 2015-10-08 MED ORDER — PANTOPRAZOLE SODIUM 40 MG PO TBEC: 40.0000 mg | DELAYED_RELEASE_TABLET | Freq: Every day | ORAL | Status: DC

## 2015-10-08 NOTE — Patient Instructions (Signed)
GERD (REFLUX)  is an extremely common cause of respiratory symptoms just like yours , many times with no obvious heartburn at all.    It can be treated with medication, but also with lifestyle changes including elevation of the head of your bed (ideally with 6 inch  bed blocks),  Smoking cessation, avoidance of late meals, excessive alcohol, and avoid fatty foods, chocolate, peppermint, colas, red wine, and acidic juices such as orange juice.  NO MINT OR MENTHOL PRODUCTS SO NO COUGH DROPS  USE SUGARLESS CANDY INSTEAD (Jolley ranchers or Stover's or Life Savers) or even ice chips will also do - the key is to swallow to prevent all throat clearing. NO OIL BASED VITAMINS - use powdered substitutes.    Pantoprazole (protonix) 40 mg   Take  30-60 min before first meal of the day and Pepcid (famotidine)  20 mg one @  bedtime until return to office - this is the best way to tell whether stomach acid is contributing to your problem.    Please schedule a follow up office visit in 6 weeks, call sooner if needed with pfts on return

## 2015-10-08 NOTE — Progress Notes (Signed)
Subjective:    Patient ID: Phillip Jordan, male    DOB: February 07, 1930   MRN: [id number]  Brief patient profile:  82  year old male, ex-smoker (1ppd x 30 years, quit 1990s), diagnosed with COPD per hx by Dr [name] (fev1 1.38L/44%, ratio 51 in Jan 2009). C/w GOLD III           05/25/2013 consult/ [name] re: sob/ prev eval MR with GOLD III COPD Chief Complaint  Patient presents with  . Pulmonary Consult    Referred per Dr. Kathlene November. The pt c/o DOE for the past 15 years, but worse for the past 6 months. He states that he gets SOB with walking up hills, but does okay with his exercise program 2-3 times per wk. He also c/o hoarseness and cough "for a long time"- cough is prod in the am with minimal clear sputum.      All his days are about the same in terms of activity tolerance  Breathing got better p quit smoking then worse again starting around mid 1990s Better on dulera, no better on spiriva ? Worse hoarseness so stopped it Work on inhaler technique:    Pantoprazole (protonix) 40 mg   Take 30-60 min before first meal of the day and Pepcid 20 mg one bedtime until return to office - this is the best way to tell whether stomach acid is contributing to your problem.  GERD diet   07/05/2013 f/u ov/Corbyn Wildey re: COPD GOLD III Chief Complaint  Patient presents with  . Followup with PFT    Pt states that overall his SOB and cough have improved since the last visit. He denies any new co's today.    No longer needing much albuterol at all and able to tol desired activities s tendency to aecopd but still "not as good as I get on prednisone" rec Increase dulera to 200 Take 2 puffs first thing in am and then another 2 puffs about 12 hours later.  Work on inhaler technique:  If you like the dulera 200 better after 2 weeks fill the rx> done   10/03/2013 f/u ov/Natonya Finstad re: COPD GOLD III Chief Complaint  Patient presents with  . Follow-up    Breathing is fine. dry cough d/t PND.   cough tends to be early  in am but does not wake him up  Has clariton not using at all  Not limited from desired activities by breathing rec Continue dulera 200 Take 2 puffs first thing in am and then another 2 puffs about 12 hours later.  Only use your albuterol (proair/RED) as a rescue medication   If hoarseness worsen you will need a spacer device  If hoarseness ok then you can try to wean the acid suppression per Dr [name]  Try clariton for the dripping nose.   ED eval 08/28/15 c/w copd exac/ viral uri    10/08/2015  Ext f/u ov/Mateja Dier re: p er eval/ transition - GOLD III copd/ maint on dulera 200 / [place] saba  Chief Complaint  Patient presents with  . COPD    Last seen in 2015. Reports increased SOB, chest tightness, wheezing and dry coughing.   baseline using dulera 200 2bid avg use saba 2-3 month Worse since oct 2016 increase need for ventolin before ex but also sensation p supper frequently strangling when watching TV x 6 month No question improves ventolin if uses it before ex      No obvious day to day or  daytime variabilty or assoc excess/ purulent sputum or mucus plugs   or cp or   overt sinus or hb symptoms. No unusual exp hx or h/o childhood pna/ asthma or knowledge of premature birth.  Also denies any obvious fluctuation of symptoms with weather or environmental changes or other aggravating or alleviating factors except as outlined above   Current Medications, Allergies, Complete Past Medical History, Past Surgical History, Family History, and Social History were reviewed in [employer] record.  ROS  The following are not active complaints unless bolded sore throat, dysphagia, dental problems, itching, sneezing,  nasal congestion or excess/ purulent secretions, ear ache,   fever, chills, sweats, unintended wt loss, pleuritic or exertional cp, hemoptysis,  orthopnea pnd or leg swelling, presyncope, palpitations, heartburn, abdominal pain, anorexia, nausea, vomiting,  diarrhea  or change in bowel or urinary habits, change in stools or urine, dysuria,hematuria,  rash, arthralgias, visual complaints, headache, numbness weakness or ataxia or problems with walking or coordination,  change in mood/affect or memory.          Objective:   Physical Exam  amb robust wm nad mod hoarse   10/03/2013        175  > 10/08/2015  176  07/05/13 176 lb (79.833 kg)  06/26/13 173 lb 12.8 oz (78.835 kg)  06/16/13 174 lb (78.926 kg)         HEENT mild turbinate edema.  Oropharynx no thrush or excess pnd or cobblestoning.  No JVD or cervical adenopathy. Mild accessory muscle hypertrophy. Trachea midline, nl thryroid. Chest was hyperinflated by percussion with diminished breath sounds and moderate increased exp time without wheeze. Hoover sign positive at mid inspiration. Regular rate and rhythm without murmur gallop or rub or increase P2 or edema.  Abd: no hsm, nl excursion. Ext warm without cyanosis or clubbing.          I personally reviewed images and agree with radiology impression as follows:  CXR:  08/28/15  COPD changes with basilar fibrosis LEFT greater than RIGHT. No acute abnormalities. .   Assessment & Plan:

## 2015-10-09 ENCOUNTER — Encounter: Payer: Self-pay | Admitting: Internal Medicine

## 2015-10-09 NOTE — Assessment & Plan Note (Signed)
-   PFT's 07/02/11  FEV1  1.41 (55%) ratio 66 and 12% better p B2 with DLCO 96% - PFTs  07/05/2013 FEV1  1.05 and 1.31 p B2 (24%) with DLCO 59 corrects to 84%  - Spiriva and other mdi result in hoarseness - hfa 05/25/2013 75% p coaching>  90% 07/05/13  - added gerd rx 05/25/2013 >> improved 07/05/13  - rx dulera 200 2bid 07/05/13 > better 10/03/2013  - 10/08/2015  extensive coaching HFA effectiveness =  90% - 10/08/2015 added GERD rx back due to pm strangling/ hoarseness    DDX of  difficult airways management almost all start with A and  include Adherence, Ace Inhibitors, Acid Reflux, Active Sinus Disease, Alpha 1 Antitripsin deficiency, Anxiety masquerading as Airways dz,  ABPA,  Allergy(esp in young), Aspiration (esp in elderly), Adverse effects of meds,  Active smokers, A bunch of PE's (a small clot burden can't cause this syndrome unless there is already severe underlying pulm or vascular dz with poor reserve) plus two Bs  = Bronchiectasis and Beta blocker use..and one C= CHF   Adherence is always the initial "prime suspect" and is a multilayered concern that requires a "trust but verify" approach in every patient - starting with knowing how to use medications, especially inhalers, correctly, keeping up with refills and understanding the fundamental difference between maintenance and prns vs those medications only taken for a very short course and then stopped and not refilled.  - The proper method of use, as well as anticipated side effects, of a metered-dose inhaler are discussed and demonstrated to the patient. Improved effectiveness after extensive coaching during this visit to a level of approximately 90 % from a baseline of 75 %   ? Acid (or non-acid) GERD > always difficult to exclude as up to 75% of pts in some series report no assoc GI/ Heartburn symptoms and his pm strangling sensation sitting watching tv is very suggestive and he's had previous resp to gerd rx > rec max (24h)  acid  suppression and diet restrictions/ reviewed and instructions given in writing.   ? Allergy > doubt/ continue singulair for now  Needs repeat pfts p 6 weeks max gerd rx  I had an extended discussion with the patient reviewing all relevant studies completed to date and  lasting 25  minutes of a 40  minute office visit    Each maintenance medication was reviewed in detail including most importantly the difference between maintenance and prns and under what circumstances the prns are to be triggered using an action plan format that is not reflected in the computer generated alphabetically organized AVS.    Please see instructions for details which were reviewed in writing and the patient given a copy highlighting the part that I personally wrote and discussed at today's ov.

## 2015-10-11 ENCOUNTER — Telehealth: Payer: Self-pay | Admitting: Internal Medicine

## 2015-10-11 MED ORDER — PREDNISONE 10 MG PO TABS: ORAL_TABLET | ORAL | Status: DC

## 2015-10-11 MED ORDER — AZITHROMYCIN 250 MG PO TABS: ORAL_TABLET | ORAL | Status: DC

## 2015-10-11 NOTE — Telephone Encounter (Signed)
Prednisone 10 mg take  4 each am x 2 days,   2 each am x 2 days,  1 each am x 2 days and stop zpak 

## 2015-10-11 NOTE — Telephone Encounter (Signed)
Per 10/08/15 OV: Patient Instructions       GERD (REFLUX)  is an extremely common cause of respiratory symptoms just like yours , many times with no obvious heartburn at all.   It can be treated with medication, but also with lifestyle changes including elevation of the head of your bed (ideally with 6 inch  bed blocks),  Smoking cessation, avoidance of late meals, excessive alcohol, and avoid fatty foods, chocolate, peppermint, colas, red wine, and acidic juices such as orange juice.   NO MINT OR MENTHOL PRODUCTS SO NO COUGH DROPS  USE SUGARLESS CANDY INSTEAD (Jolley ranchers or Stover's or Life Savers) or even ice chips will also do - the key is to swallow to prevent all throat clearing. NO OIL BASED VITAMINS - use powdered substitutes. Pantoprazole (protonix) 40 mg   Take  30-60 min before first meal of the day and Pepcid (famotidine)  20 mg one @  bedtime until return to office - this is the best way to tell whether stomach acid is contributing to your problem.   Please schedule a follow up office visit in 6 weeks, call sooner if needed with pfts on return    --  Called spoke with pt. He reports he has developed a prod cough (green phlem), wheezing, nasal cong, PND. Pt is taking mucinex, pantoprazole and pepcid as well. Please advise MW thanks

## 2015-10-11 NOTE — Telephone Encounter (Signed)
Spoke with spouse and is aware RX sent in. Nothing further needed 

## 2015-10-17 ENCOUNTER — Other Ambulatory Visit: Payer: Self-pay | Admitting: Internal Medicine

## 2015-10-21 ENCOUNTER — Ambulatory Visit (INDEPENDENT_AMBULATORY_CARE_PROVIDER_SITE_OTHER): Payer: PPO | Admitting: Internal Medicine

## 2015-10-21 ENCOUNTER — Encounter: Payer: Self-pay | Admitting: Internal Medicine

## 2015-10-21 ENCOUNTER — Ambulatory Visit (INDEPENDENT_AMBULATORY_CARE_PROVIDER_SITE_OTHER)
Admission: RE | Admit: 2015-10-21 | Discharge: 2015-10-21 | Disposition: A | Discharge status: Home / Self Care | Payer: PPO | Source: Ambulatory Visit | Attending: Internal Medicine | Admitting: Internal Medicine

## 2015-10-21 ENCOUNTER — Telehealth: Payer: Self-pay | Admitting: Internal Medicine

## 2015-10-21 VITALS — BP 134/80 | HR 81 | Temp 98.1°F | Ht 69.0 in | Wt 172.6 lb

## 2015-10-21 DIAGNOSIS — J449 Chronic obstructive pulmonary disease, unspecified: Secondary | ICD-10-CM

## 2015-10-21 DIAGNOSIS — R05 Cough: Secondary | ICD-10-CM | POA: Diagnosis not present

## 2015-10-21 DIAGNOSIS — R0602 Shortness of breath: Secondary | ICD-10-CM | POA: Diagnosis not present

## 2015-10-21 MED ORDER — PREDNISONE 10 MG PO TABS: ORAL_TABLET | ORAL | Status: DC

## 2015-10-21 MED ORDER — LEVOFLOXACIN 500 MG PO TABS: 500.0000 mg | ORAL_TABLET | Freq: Every day | ORAL | Status: DC

## 2015-10-21 MED ORDER — TRAMADOL HCL 50 MG PO TABS
ORAL_TABLET | ORAL | Status: DC
Start: 2015-10-21 — End: 2015-11-08

## 2015-10-21 NOTE — Telephone Encounter (Signed)
Error.Stanley A Dalton ° °

## 2015-10-21 NOTE — Progress Notes (Signed)
Quick Note:  LMTCB ______ 

## 2015-10-21 NOTE — Patient Instructions (Addendum)
Levaquin 500 mg daily x 7 days Prednisone 10 mg take  4 each am x 2 days,   2 each am x 2 days,  1 each am x 2 days and stop   For cough > mucinex dm 1200 mg every 12 hours and suppelement with tramadol 50 mg one every 4 hours if needed  For Breathing Only use your albuterol as a rescue medication to be used if you can't catch your breath by resting or doing a relaxed purse lip breathing pattern.  - The less you use it, the better it will work when you need it. - Ok to use up to 2 puffs  every 4 hours if you must but call for immediate appointment if use goes up over your usual need - Don't leave home without it !!  (think of it like the spare tire for your car)   Please remember to go to the x-ray department downstairs for your tests - we will call you with the results when they are available.

## 2015-10-21 NOTE — Assessment & Plan Note (Addendum)
- PFT's 07/02/11  FEV1  1.41 (55%) ratio 66 and 12% better p B2 with DLCO 96% - PFTs  07/05/2013 FEV1  1.05 and 1.31 p B2 (24%) with DLCO 59 corrects to 84%  - Spiriva and other mdi result in hoarseness - hfa 05/25/2013 75% p coaching>  90% 07/05/13  - added gerd rx 05/25/2013 >> improved 07/05/13  - rx dulera 200 2bid 07/05/13 > better 10/03/2013  - 10/08/2015  extensive coaching HFA effectiveness =  90% - 10/08/2015 added GERD rx back due to pm strangling/ hoarseness   DDX of  difficult airways management almost all start with A and  include Adherence, Ace Inhibitors, Acid Reflux, Active Sinus Disease, Alpha 1 Antitripsin deficiency, Anxiety masquerading as Airways dz,  ABPA,  Allergy(esp in young), Aspiration (esp in elderly), Adverse effects of meds,  Active smokers, A bunch of PE's (a small clot burden can't cause this syndrome unless there is already severe underlying pulm or vascular dz with poor reserve) plus two Bs  = Bronchiectasis and Beta blocker use..and one C= CHF  Adherence is always the initial "prime suspect" and is a multilayered concern that requires a "trust but verify" approach in every patient - starting with knowing how to use medications, especially inhalers, correctly, keeping up with refills and understanding the fundamental difference between maintenance and prns vs those medications only taken for a very short course and then stopped and not refilled.   - The proper method of use, as well as anticipated side effects, of a metered-dose inhaler are discussed and demonstrated to the patient. Improved effectiveness after extensive coaching during this visit to a level of approximately 90 % from a baseline of 75 %  ? Acid (or non-acid) GERD > always difficult to exclude as up to 75% of pts in some series report no assoc GI/ Heartburn symptoms> rec max (24h)  acid suppression and diet restrictions/ reviewed and instructions given in writing.  - Explained the natural history of uri and  why it's necessary in patients at risk to treat GERD aggressively - at least  short term -   to reduce risk of evolving cyclical cough initially  triggered by epithelial injury and a heightened sensitivty to the effects of any upper airway irritants,  most importantly acid - related - then perpetuated by epithelial injury related to the cough itself as the upper airway collapses on itself.  That is, the more sensitive the epithelium becomes once it is damaged by the virus, the more the ensuing irritability> the more the cough, the more the secondary reflux (especially in those prone to reflux) the more the irritation of the sensitive mucosa and so on in a  Classic cyclical pattern.    ? Allergy/ asthmatic component > continue singulair/ Prednisone 10 mg take  4 each am x 2 days,   2 each am x 2 days,  1 each am x 2 days and stop/ repeat pfts pending   ? Active sinus dz > levaquin x 7 days then sinus ct if not better   I had an extended discussion with the patient reviewing all relevant studies completed to date and  lasting 15 to 20 minutes of a 25 minute visit    Each maintenance medication was reviewed in detail including most importantly the difference between maintenance and prns and under what circumstances the prns are to be triggered using an action plan format that is not reflected in the computer generated alphabetically organized AVS.  Please see instructions for details which were reviewed in writing and the patient given a copy highlighting the part that I personally wrote and discussed at today's ov.

## 2015-10-21 NOTE — Progress Notes (Signed)
Subjective:    Patient ID: Phillip Jordan, male    DOB: 27-Jul-1930   MRN: [id number]  Brief patient profile:  82  year old male, ex-smoker (1ppd x 30 years, quit 1990s), diagnosed with COPD per hx by Phillip Phillip Jordan (fev1 1.38L/44%, ratio 51 in Jan 2009). C/w GOLD III           05/25/2013 consult/ [name] re: sob/ prev eval MR with GOLD III COPD Chief Complaint  Patient presents with  . Pulmonary Consult    Referred per Phillip. Kathlene Jordan. The pt c/o DOE for the past 15 years, but worse for the past 6 months. He states that he gets SOB with walking up hills, but does okay with his exercise program 2-3 times per wk. He also c/o hoarseness and cough "for a long time"- cough is prod in the am with minimal clear sputum.      All his days are about the same in terms of activity tolerance  Breathing got better p quit smoking then worse again starting around mid 1990s Better on dulera, no better on spiriva ? Worse hoarseness so stopped it Work on inhaler technique:    Pantoprazole (protonix) 40 mg   Take 30-60 min before first meal of the day and Pepcid 20 mg one bedtime until return to office - this is the best way to tell whether stomach acid is contributing to your problem.  GERD diet   07/05/2013 f/u ov/Phillip Jordan re: COPD GOLD III Chief Complaint  Patient presents with  . Followup with PFT    Pt states that overall his SOB and cough have improved since the last visit. He denies any new co's today.    No longer needing much albuterol at all and able to tol desired activities s tendency to aecopd but still "not as good as I get on prednisone" rec Increase dulera to 200 Take 2 puffs first thing in am and then another 2 puffs about 12 hours later.  Work on inhaler technique:  If you like the dulera 200 better after 2 weeks fill the rx> done   10/03/2013 f/u ov/Phillip Jordan re: COPD GOLD III Chief Complaint  Patient presents with  . Follow-up    Breathing is fine. dry cough d/t PND.   cough tends to be early  in am but does not wake him up  Has clariton not using at all  Not limited from desired activities by breathing rec Continue dulera 200 Take 2 puffs first thing in am and then another 2 puffs about 12 hours later.  Only use your albuterol (proair/RED) as a rescue medication   If hoarseness worsen you will need a spacer device  If hoarseness ok then you can try to wean the acid suppression per Phillip Jordan  Try clariton for the dripping nose.   ED eval 08/28/15 c/w copd exac/ viral uri    10/08/2015  Ext f/u ov/Phillip Jordan re: p er eval/ transition - GOLD III copd/ maint on dulera 200 / [place] saba  Chief Complaint  Patient presents with  . COPD    Last seen in 2015. Reports increased SOB, chest tightness, wheezing and dry coughing.   baseline using dulera 200 2bid avg use saba 2-3 month Worse since oct 2016 increase need for ventolin before ex but also sensation p supper frequently strangling when watching TV x 6 month No question improves p ventolin if uses it before ex  rec GERD  Diet  Pantoprazole (protonix) 40 mg  Take  30-60 min before first meal of the day and Pepcid (famotidine)  20 mg one @  bedtime until return to office - this is the best way to tell whether stomach acid is contributing to your problem.     10/11/15  Phone care:   Prednisone 10 mg take  4 each am x 2 days,   2 each am x 2 days,  1 each am x 2 days and stop / zpak    10/21/2015 acute extended ov/Phillip Jordan re: copd III maint on dulera 200 2bid/ singulair /prn saba  Chief Complaint  Patient presents with  . Acute Visit    Finished antibiotics last Tueday felt good at first then by Friday started feeling bad again. Prod Coughing with white with some green mucus, wheezing, SOB worse with exertion. Denies any cp/tightness.    better from rx 2/22 then sick again 2/24 assoc with sensation of strangling/ choking at hs  And increase need for saba    No obvious day to day or daytime variabilty or assoc   mucus plugs   or cp or    overt sinus or hb symptoms. No unusual exp hx or h/o childhood pna/ asthma or knowledge of premature birth.  Also denies any obvious fluctuation of symptoms with weather or environmental changes or other aggravating or alleviating factors except as outlined above   Current Medications, Allergies, Complete Past Medical History, Past Surgical History, Family History, and Social History were reviewed in [employer] record.  ROS  The following are not active complaints unless bolded sore throat, dysphagia, dental problems, itching, sneezing,  nasal congestion or excess/ purulent secretions, ear ache,   fever, chills, sweats, unintended wt loss, pleuritic or exertional cp, hemoptysis,  orthopnea pnd or leg swelling, presyncope, palpitations, heartburn, abdominal pain, anorexia, nausea, vomiting, diarrhea  or change in bowel or urinary habits, change in stools or urine, dysuria,hematuria,  rash, arthralgias, visual complaints, headache, numbness weakness or ataxia or problems with walking or coordination,  change in mood/affect or memory.          Objective:   Physical Exam  amb robust wm nad mod hoarse   10/03/2013        175  > 10/08/2015  176 >  10/21/2015 173  07/05/13 176 lb (79.833 kg)  06/26/13 173 lb 12.8 oz (78.835 kg)  06/16/13 174 lb (78.926 kg)         HEENT mild turbinate edema.  Oropharynx no thrush or excess pnd or cobblestoning.  No JVD or cervical adenopathy. Mild accessory muscle hypertrophy. Trachea midline, nl thryroid. Chest was hyperinflated by percussion with diminished breath sounds and moderate increased exp time without wheeze. Hoover sign positive at mid inspiration. Regular rate and rhythm without murmur gallop or rub or increase P2 or edema.  Abd: no hsm, nl excursion. Ext warm without cyanosis or clubbing.          I personally reviewed images and agree with radiology impression as follows:  CXR: 10/21/2015  No acute abnormality. Chronic  obstructive and interstitial lung disease.   Assessment & Plan:

## 2015-10-22 NOTE — Progress Notes (Signed)
Quick Note:  Spoke with pt and notified of results per Dr. Wert. Pt verbalized understanding and denied any questions.  ______ 

## 2015-10-24 ENCOUNTER — Telehealth: Payer: Self-pay | Admitting: Internal Medicine

## 2015-10-24 NOTE — Telephone Encounter (Signed)
Spoke with the pt's spouse  She states that the pt is not much better  Still coughing and wheezing  She is afraid to go over the w/e without him being seen  OV with MW for tomorrow at 1:30 pm  Advised ED sooner if needed

## 2015-10-25 ENCOUNTER — Telehealth: Payer: Self-pay | Admitting: Internal Medicine

## 2015-10-25 ENCOUNTER — Ambulatory Visit (INDEPENDENT_AMBULATORY_CARE_PROVIDER_SITE_OTHER): Payer: PPO | Admitting: Internal Medicine

## 2015-10-25 ENCOUNTER — Encounter: Payer: Self-pay | Admitting: Internal Medicine

## 2015-10-25 VITALS — BP 146/72 | HR 71 | Temp 97.6°F | Ht 69.0 in | Wt 173.4 lb

## 2015-10-25 DIAGNOSIS — J449 Chronic obstructive pulmonary disease, unspecified: Secondary | ICD-10-CM | POA: Diagnosis not present

## 2015-10-25 MED ORDER — PREDNISONE 10 MG PO TABS: ORAL_TABLET | ORAL | Status: DC

## 2015-10-25 MED ORDER — ACAPELLA MISC: Status: DC

## 2015-10-25 MED ORDER — AMOXICILLIN-POT CLAVULANATE 875-125 MG PO TABS: 1.0000 | ORAL_TABLET | Freq: Two times a day (BID) | ORAL | Status: DC

## 2015-10-25 NOTE — Patient Instructions (Addendum)
For cough > mucinex dm 1200 mg every 12 hours use flutter valve and suppelement with tramadol 50 mg one every 4 hours if needed  For Breathing: Only use your albuterol as a rescue medication to be used if you can't catch your breath by resting or doing a relaxed purse lip breathing pattern.  - The less you use it, the better it will work when you need it. - Ok to use up to 2 puffs  every 4 hours if you must but call for immediate appointment if use goes up over your usual need - Don't leave home without it !!  (think of it like the spare tire for your car)   If not better by time you finish levaquin> Augmentin 875 mg take one pill twice daily  X 10 days - take at breakfast and supper with large glass of water.  It would help reduce the usual side effects (diarrhea and yeast infections) if you ate cultured yogurt at lunch.   Prednisone 10 mg take  4 each am x 2 days,   2 each am x 2 days,  1 each am x 2 days and stop   Keep appt for lung function testing but call sooner if needed

## 2015-10-25 NOTE — Telephone Encounter (Signed)
LMTCB x 1 

## 2015-10-25 NOTE — Progress Notes (Signed)
Subjective:    Patient ID: Phillip Jordan, male    DOB: 1930-03-02   MRN: [id number]  Brief patient profile:  82  year old male, ex-smoker (1ppd x 30 years, quit 1990s), diagnosed with COPD per hx by Dr [name] (fev1 1.38L/44%, ratio 51 in Jan 2009). C/w GOLD III           05/25/2013 consult/ [name] re: sob/ prev eval MR with GOLD III COPD Chief Complaint  Patient presents with  . Pulmonary Consult    Referred per Dr. Kathlene November. The pt c/o DOE for the past 15 years, but worse for the past 6 months. He states that he gets SOB with walking up hills, but does okay with his exercise program 2-3 times per wk. He also c/o hoarseness and cough "for a long time"- cough is prod in the am with minimal clear sputum.      All his days are about the same in terms of activity tolerance  Breathing got better p quit smoking then worse again starting around mid 1990s Better on dulera, no better on spiriva ? Worse hoarseness so stopped it Work on inhaler technique:    Pantoprazole (protonix) 40 mg   Take 30-60 min before first meal of the day and Pepcid 20 mg one bedtime until return to office - this is the best way to tell whether stomach acid is contributing to your problem.  GERD diet   07/05/2013 f/u ov/Edra Riccardi re: COPD GOLD III Chief Complaint  Patient presents with  . Followup with PFT    Pt states that overall his SOB and cough have improved since the last visit. He denies any new co's today.    No longer needing much albuterol at all and able to tol desired activities s tendency to aecopd but still "not as good as I get on prednisone" rec Increase dulera to 200 Take 2 puffs first thing in am and then another 2 puffs about 12 hours later.  Work on inhaler technique:  If you like the dulera 200 better after 2 weeks fill the rx> done   10/03/2013 f/u ov/Vidyuth Belsito re: COPD GOLD III Chief Complaint  Patient presents with  . Follow-up    Breathing is fine. dry cough d/t PND.   cough tends to be early  in am but does not wake him up  Has clariton not using at all  Not limited from desired activities by breathing rec Continue dulera 200 Take 2 puffs first thing in am and then another 2 puffs about 12 hours later.  Only use your albuterol (proair/RED) as a rescue medication   If hoarseness worsen you will need a spacer device  If hoarseness ok then you can try to wean the acid suppression per Dr [name]  Try clariton for the dripping nose.   ED eval 08/28/15 c/w copd exac/ viral uri    10/08/2015  Ext f/u ov/Brayam Boeke re: p er eval/ transition - GOLD III copd/ maint on dulera 200 / [place] saba  Chief Complaint  Patient presents with  . COPD    Last seen in 2015. Reports increased SOB, chest tightness, wheezing and dry coughing.   baseline using dulera 200 2bid avg use saba 2-3 month Worse since oct 2016 increase need for ventolin before ex but also sensation p supper frequently strangling when watching TV x 6 month No question improves p ventolin if uses it before ex  rec GERD  Diet  Pantoprazole (protonix) 40 mg  Take  30-60 min before first meal of the day and Pepcid (famotidine)  20 mg one @  bedtime until return to office - this is the best way to tell whether stomach acid is contributing to your problem.     10/11/15  Phone care:   Prednisone 10 mg take  4 each am x 2 days,   2 each am x 2 days,  1 each am x 2 days and stop / zpak    10/21/2015 acute extended ov/Avanti Jetter re: copd III maint on dulera 200 2bid/ singulair /prn saba  Chief Complaint  Patient presents with  . Acute Visit    Finished antibiotics last Tueday felt good at first then by Friday started feeling bad again. Prod Coughing with white with some green mucus, wheezing, SOB worse with exertion. Denies any cp/tightness.    better from rx 2/22 then sick again 2/24 assoc with sensation of strangling/ choking at hs  And increase need for saba  rec Levaquin 500 mg daily x 7 days Prednisone 10 mg take  4 each am x 2 days,    2 each am x 2 days,  1 each am x 2 days and stop  For cough > mucinex dm 1200 mg every 12 hours and suppelement with tramadol 50 mg one every 4 hours if needed For Breathing Only use your albuterol as a rescue medication      10/25/2015 acute ext ov/Georga Stys re: flare of copd really started in Oct 2016 maint on dulera 200/singulair / rare saba   Chief Complaint  Patient presents with  . Acute Visit    Pt states cough is not improving- still prod with clear to green sputum.  He states that his breathing has worsened  and feels very weak.    cough is worse at hs and in am/ mucus still green on levquin, assoc with nasal congestion and subjective wheeze   No obvious day to day or daytime variabilty or assoc   mucus plugs  or cp or   overt  hb symptoms. No unusual exp hx or h/o childhood pna/ asthma or knowledge of premature birth.  Also denies any obvious fluctuation of symptoms with weather or environmental changes or other aggravating or alleviating factors except as outlined above   Current Medications, Allergies, Complete Past Medical History, Past Surgical History, Family History, and Social History were reviewed in [employer] record.  ROS  The following are not active complaints unless bolded sore throat, dysphagia, dental problems, itching, sneezing,  nasal congestion or excess/ purulent secretions, ear ache,   fever, chills, sweats, unintended wt loss, pleuritic or exertional cp, hemoptysis,  orthopnea pnd or leg swelling, presyncope, palpitations, heartburn, abdominal pain, anorexia, nausea, vomiting, diarrhea  or change in bowel or urinary habits, change in stools or urine, dysuria,hematuria,  rash, arthralgias, visual complaints, headache, numbness weakness or ataxia or problems with walking or coordination,  change in mood/affect or memory.          Objective:   Physical Exam  amb robust wm nad mod hoarse   10/03/2013        175  > 10/08/2015  176 >  10/21/2015  173 > 10/25/2015   173  07/05/13 176 lb (79.833 kg)  06/26/13 173 lb 12.8 oz (78.835 kg)  06/16/13 174 lb (78.926 kg)        HEENT: nl dentition  and oropharynx. Nl external ear canals without cough reflex- mucopurulent discharge both turnbinates R> L  NECK :  without JVD/Nodes/TM/ nl carotid upstrokes bilaterally   LUNGS: no acc muscle use,  slt barrel contour chest with mid exp wheeze/cough    CV:  RRR  no s3 or murmur or increase in P2, no edema   ABD:  soft and nontender with nl inspiratory excursion in the supine position. No bruits or organomegaly, bowel sounds nl  MS:  Nl gait/ ext warm without deformities, calf tenderness, cyanosis or clubbing No obvious joint restrictions   SKIN: warm and dry without lesions    NEURO:  alert, approp, nl sensorium with  no motor deficits           I personally reviewed images and agree with radiology impression as follows:  CXR: 10/21/2015  No acute abnormality. Chronic obstructive and interstitial lung disease.   Assessment & Plan:

## 2015-10-26 ENCOUNTER — Encounter: Payer: Self-pay | Admitting: Internal Medicine

## 2015-10-26 NOTE — Assessment & Plan Note (Addendum)
-   PFT's 07/02/11  FEV1  1.41 (55%) ratio 66 and 12% better p B2 with DLCO 96% - PFTs  07/05/2013 FEV1  1.05 and 1.31 p B2 (24%) with DLCO 59 corrects to 84%  - Spiriva and other mdi result in hoarseness - hfa 05/25/2013 75% p coaching>  90% 07/05/13  - added gerd rx 05/25/2013 >> improved 07/05/13  - rx dulera 200 2bid 07/05/13 > better 10/03/2013  - 10/08/2015  extensive coaching HFA effectiveness =  90% - 10/08/2015 added GERD rx back due to pm strangling/ hoarseness  - 10/25/2015 added flutter - 10/25/2015  extensive coaching HFA effectiveness =   90%    DDX of  difficult airways management almost all start with A and  include Adherence, Ace Inhibitors, Acid Reflux, Active Sinus Disease, Alpha 1 Antitripsin deficiency, Anxiety masquerading as Airways dz,  ABPA,  Allergy(esp in young), Aspiration (esp in elderly), Adverse effects of meds,  Active smokers, A bunch of PE's (a small clot burden can't cause this syndrome unless there is already severe underlying pulm or vascular dz with poor reserve) plus two Bs  = Bronchiectasis and Beta blocker use..and one C= CHF  Adherence is always the initial "prime suspect" and is a multilayered concern that requires a "trust but verify" approach in every patient - starting with knowing how to use medications, especially inhalers, correctly, keeping up with refills and understanding the fundamental difference between maintenance and prns vs those medications only taken for a very short course and then stopped and not refilled.  - - The proper method of use, as well as anticipated side effects, of a metered-dose inhaler are discussed and demonstrated to the patient. Improved effectiveness after extensive coaching during this visit to a level of approximately 90 % from a baseline of 75 % so ok to continue dulera 200 2bid  ? Acid (or non-acid) GERD > always difficult to exclude as up to 75% of pts in some series report no assoc GI/ Heartburn symptoms> rec continue max (24h)   acid suppression and diet restrictions/ reviewed     ? Active sinus dz > if not better p levquin rx augmentin x 10 days   ? Allergy / asthma component > singulair and Prednisone 10 mg take  4 each am x 2 days,   2 each am x 2 days,  1 each am x 2 days and stop   I had an extended discussion with the patient and wife reviewing all relevant studies completed to date and  lasting 25 minutes of a 40 minute actue extended visit  Re refractory symptom management  Each maintenance medication was reviewed in detail including most importantly the difference between maintenance and prns and under what circumstances the prns are to be triggered using an action plan format that is not reflected in the computer generated alphabetically organized AVS.    Please see instructions for details which were reviewed in writing and the patient given a copy highlighting the part that I personally wrote and discussed at today's ov.

## 2015-10-28 ENCOUNTER — Telehealth: Payer: Self-pay | Admitting: Internal Medicine

## 2015-10-28 MED ORDER — PROMETHAZINE HCL 12.5 MG PO TABS: 12.5000 mg | ORAL_TABLET | Freq: Four times a day (QID) | ORAL | Status: DC | PRN

## 2015-10-28 NOTE — Telephone Encounter (Signed)
Probably due to levaquin so now's a good time to stop the levaquin and start the augmenti   Ok to call in phenergan 12.5 mg tablets #12 to take one q 6h prn if desired

## 2015-10-28 NOTE — Telephone Encounter (Signed)
Spoke with pt's wife, states that pt has a nonprod cough, nausea Xseveral days.  Denies fever, chills, chest pain.  Pt was given a pred taper on 3/3 at ov with MW.  Pt was given a rx for augmentin on Friday but pt has not filled rx yet.  Pt is taking otc Emitrol to help with nausea, which is providing minimal relief per pt's wife.  Requesting further recs from MW>  MW please advise.  Thanks!     Patient Instructions       For cough > mucinex dm 1200 mg every 12 hours use flutter valve and suppelement with tramadol 50 mg one every 4 hours if needed  For Breathing: Only use your albuterol as a rescue medication to be used if you can't catch your breath by resting or doing a relaxed purse lip breathing pattern.   - The less you use it, the better it will work when you need it. - Ok to use up to 2 puffs  every 4 hours if you must but call for immediate appointment if use goes up over your usual need - Don't leave home without it !!  (think of it like the spare tire for your car)   If not better by time you finish levaquin> Augmentin 875 mg take one pill twice daily  X 10 days - take at breakfast and supper with large glass of water.  It would help reduce the usual side effects (diarrhea and yeast infections) if you ate cultured yogurt at lunch.   Prednisone 10 mg take  4 each am x 2 days,   2 each am x 2 days,  1 each am x 2 days and stop   Keep appt for lung function testing but call sooner if needed

## 2015-10-28 NOTE — Telephone Encounter (Signed)
Spoke with pt's wife, aware of recs.  rx sent to preferred pharmacy.  Nothing further needed.  

## 2015-10-28 NOTE — Telephone Encounter (Signed)
lmtcb X2 for [name] with [facility]

## 2015-10-29 NOTE — Telephone Encounter (Signed)
Attempted to contact [place] with [facility], message states that Phillip Jordan is out of office from 10/28/15 until 11/04/15.  Left message to call back.

## 2015-11-01 NOTE — Telephone Encounter (Signed)
Will call on 3.13.17 when Phillip Jordan returns to office.

## 2015-11-04 NOTE — Telephone Encounter (Signed)
LMTCB for [name] 

## 2015-11-05 NOTE — Telephone Encounter (Signed)
lmtcb for [name].  

## 2015-11-06 ENCOUNTER — Telehealth: Payer: Self-pay | Admitting: Internal Medicine

## 2015-11-06 DIAGNOSIS — R05 Cough: Secondary | ICD-10-CM

## 2015-11-06 DIAGNOSIS — R059 Cough, unspecified: Secondary | ICD-10-CM

## 2015-11-06 NOTE — Telephone Encounter (Signed)
Sinus ct and ov next available for me or np and all meds in hand

## 2015-11-06 NOTE — Telephone Encounter (Signed)
Spoke with pt's wife, aware of recs.  Ct ordered, pt scheduled to see MW on Friday.  Nothing further needed.

## 2015-11-06 NOTE — Telephone Encounter (Signed)
Spoke with pt's wife, states that pt's cough has improved some but is not completely gone-pt has taken all of his medications as prescribed.  Pt's cough is nonproductive.  Denies sinus congestion, headache, fever, chills.  Pt is fatigued, has no energy, gets very sob with any exertion.   Pt has also taken tylenol to help with s/s.    Pt uses [facility] on [address].    MW please advise on recs.  Thanks.   Patient Instructions       For cough > mucinex dm 1200 mg every 12 hours use flutter valve and suppelement with tramadol 50 mg one every 4 hours if needed  For Breathing: Only use your albuterol as a rescue medication to be used if you can't catch your breath by resting or doing a relaxed purse lip breathing pattern.   - The less you use it, the better it will work when you need it. - Ok to use up to 2 puffs  every 4 hours if you must but call for immediate appointment if use goes up over your usual need - Don't leave home without it !!  (think of it like the spare tire for your car)   If not better by time you finish levaquin> Augmentin 875 mg take one pill twice daily  X 10 days - take at breakfast and supper with large glass of water.  It would help reduce the usual side effects (diarrhea and yeast infections) if you ate cultured yogurt at lunch.   Prednisone 10 mg take  4 each am x 2 days,   2 each am x 2 days,  1 each am x 2 days and stop   Keep appt for lung function testing but call sooner if needed      Patient Instructions       For cough > mucinex dm 1200 mg every 12 hours use flutter valve and suppelement with tramadol 50 mg one every 4 hours if needed  For Breathing: Only use your albuterol as a rescue medication to be used if you can't catch your breath by resting or doing a relaxed purse lip breathing pattern.   - The less you use it, the better it will work when you need it. - Ok to use up to 2 puffs  every 4 hours if you must but call for immediate appointment if use goes up  over your usual need - Don't leave home without it !!  (think of it like the spare tire for your car)   If not better by time you finish levaquin> Augmentin 875 mg take one pill twice daily  X 10 days - take at breakfast and supper with large glass of water.  It would help reduce the usual side effects (diarrhea and yeast infections) if you ate cultured yogurt at lunch.   Prednisone 10 mg take  4 each am x 2 days,   2 each am x 2 days,  1 each am x 2 days and stop   Keep appt for lung function testing but call sooner if needed

## 2015-11-06 NOTE — Telephone Encounter (Signed)
LMTCB and will close per triage protocol 

## 2015-11-06 NOTE — Telephone Encounter (Signed)
Spoke with Magda Paganini here at our office. This form was completed yesterday and was mailed back. I have left a message with Jackelyn Poling letting her know this information. Nothing further was needed at this time.

## 2015-11-07 ENCOUNTER — Ambulatory Visit (INDEPENDENT_AMBULATORY_CARE_PROVIDER_SITE_OTHER)
Admission: RE | Admit: 2015-11-07 | Discharge: 2015-11-07 | Disposition: A | Discharge status: Home / Self Care | Payer: PPO | Source: Ambulatory Visit | Attending: Internal Medicine | Admitting: Internal Medicine

## 2015-11-07 DIAGNOSIS — R05 Cough: Secondary | ICD-10-CM

## 2015-11-07 DIAGNOSIS — J329 Chronic sinusitis, unspecified: Secondary | ICD-10-CM | POA: Diagnosis not present

## 2015-11-07 DIAGNOSIS — R059 Cough, unspecified: Secondary | ICD-10-CM

## 2015-11-08 ENCOUNTER — Encounter: Payer: Self-pay | Admitting: Internal Medicine

## 2015-11-08 ENCOUNTER — Ambulatory Visit (INDEPENDENT_AMBULATORY_CARE_PROVIDER_SITE_OTHER): Payer: PPO | Admitting: Internal Medicine

## 2015-11-08 VITALS — BP 152/80 | HR 81 | Ht 69.0 in | Wt 170.4 lb

## 2015-11-08 DIAGNOSIS — J449 Chronic obstructive pulmonary disease, unspecified: Secondary | ICD-10-CM | POA: Diagnosis not present

## 2015-11-08 DIAGNOSIS — J329 Chronic sinusitis, unspecified: Secondary | ICD-10-CM | POA: Diagnosis not present

## 2015-11-08 MED ORDER — AMOXICILLIN-POT CLAVULANATE 875-125 MG PO TABS: 1.0000 | ORAL_TABLET | Freq: Two times a day (BID) | ORAL | Status: DC

## 2015-11-08 MED ORDER — ALBUTEROL SULFATE HFA 108 (90 BASE) MCG/ACT IN AERS: INHALATION_SPRAY | RESPIRATORY_TRACT | Status: DC

## 2015-11-08 NOTE — Progress Notes (Signed)
Quick Note:  D/w Pt at ov ______

## 2015-11-08 NOTE — Patient Instructions (Addendum)
Please see patient coordinator before you leave today  to schedule ent eval   Plan A = Automatic = dulera 200 Take 2 puffs first thing in am and then another 2 puffs about 12 hours later.   Plan B = Backup Only use your albuterol(ventolin/proair) as a rescue medication to be used if you can't catch your breath by resting or doing a relaxed purse lip breathing pattern.  - The less you use it, the better it will work when you need it. - Ok to use up to 2 puffs  every 4 hours if you must but call for appointment if use goes up over your usual need - Don't leave home without it !!  (think of it like the spare tire for your car)   For cough > mucinex dm up to 1200 mg every 12 hours and use the flutter valve as much as possible

## 2015-11-08 NOTE — Progress Notes (Signed)
Subjective:    Patient ID: Phillip Jordan, male    DOB: 25-May-1930   MRN: [id number]  Brief patient profile:  82  year old male, ex-smoker (1ppd x 30 years, quit 1990s), diagnosed with COPD per hx by Dr [name] (fev1 1.38L/44%, ratio 51 in Jan 2009). C/w GOLD III           05/25/2013 consult/ [name] re: sob/ prev eval MR with GOLD III COPD Chief Complaint  Patient presents with  . Pulmonary Consult    Referred per Dr. Kathlene November. The pt c/o DOE for the past 15 years, but worse for the past 6 months. He states that he gets SOB with walking up hills, but does okay with his exercise program 2-3 times per wk. He also c/o hoarseness and cough "for a long time"- cough is prod in the am with minimal clear sputum.      All his days are about the same in terms of activity tolerance  Breathing got better p quit smoking then worse again starting around mid 1990s Better on dulera, no better on spiriva ? Worse hoarseness so stopped it Work on inhaler technique:    Pantoprazole (protonix) 40 mg   Take 30-60 min before first meal of the day and Pepcid 20 mg one bedtime until return to office - this is the best way to tell whether stomach acid is contributing to your problem.  GERD diet   07/05/2013 f/u ov/Mittie Knittel re: COPD GOLD III Chief Complaint  Patient presents with  . Followup with PFT    Pt states that overall his SOB and cough have improved since the last visit. He denies any new co's today.    No longer needing much albuterol at all and able to tol desired activities s tendency to aecopd but still "not as good as I get on prednisone" rec Increase dulera to 200 Take 2 puffs first thing in am and then another 2 puffs about 12 hours later.  Work on inhaler technique:  If you like the dulera 200 better after 2 weeks fill the rx> done   10/03/2013 f/u ov/Thi Klich re: COPD GOLD III Chief Complaint  Patient presents with  . Follow-up    Breathing is fine. dry cough d/t PND.   cough tends to be early  in am but does not wake him up  Has clariton not using at all  Not limited from desired activities by breathing rec Continue dulera 200 Take 2 puffs first thing in am and then another 2 puffs about 12 hours later.  Only use your albuterol (proair/RED) as a rescue medication   If hoarseness worsen you will need a spacer device  If hoarseness ok then you can try to wean the acid suppression per Dr [name]  Try clariton for the dripping nose.   ED eval 08/28/15 c/w copd exac/ viral uri    10/08/2015  Ext f/u ov/Brain Honeycutt re: p er eval/ transition - GOLD III copd/ maint on dulera 200 / [place] saba  Chief Complaint  Patient presents with  . COPD    Last seen in 2015. Reports increased SOB, chest tightness, wheezing and dry coughing.   baseline using dulera 200 2bid avg use saba 2-3 month Worse since oct 2016 increase need for ventolin before ex but also sensation p supper frequently strangling when watching TV x 6 month No question improves p ventolin if uses it before ex  rec GERD  Diet  Pantoprazole (protonix) 40 mg  Take  30-60 min before first meal of the day and Pepcid (famotidine)  20 mg one @  bedtime until return to office - this is the best way to tell whether stomach acid is contributing to your problem.     10/11/15  Phone care:   Prednisone 10 mg take  4 each am x 2 days,   2 each am x 2 days,  1 each am x 2 days and stop / zpak    10/21/2015 acute extended ov/Brittaney Beaulieu re: copd III maint on dulera 200 2bid/ singulair /prn saba  Chief Complaint  Patient presents with  . Acute Visit    Finished antibiotics last Tueday felt good at first then by Friday started feeling bad again. Prod Coughing with white with some green mucus, wheezing, SOB worse with exertion. Denies any cp/tightness.    better from rx 2/22 then sick again 2/24 assoc with sensation of strangling/ choking at hs  And increase need for saba  rec Levaquin 500 mg daily x 7 days Prednisone 10 mg take  4 each am x 2 days,    2 each am x 2 days,  1 each am x 2 days and stop  For cough > mucinex dm 1200 mg every 12 hours and suppelement with tramadol 50 mg one every 4 hours if needed For Breathing Only use your albuterol as a rescue medication      10/25/2015 acute ext ov/Kare Dado re: flare of copd really started in Oct 2016 maint on dulera 200/singulair / rare saba   Chief Complaint  Patient presents with  . Acute Visit    Pt states cough is not improving- still prod with clear to green sputum.  He states that his breathing has worsened  and feels very weak.   cough is worse at hs and in am/ mucus still green on levquin, assoc with nasal congestion and subjective wheeze  rec For cough > mucinex dm 1200 mg every 12 hours use flutter valve and suppelement with tramadol 50 mg one every 4 hours if needed For Breathing: Only use your albuterol as a rescue medication If not better by time you finish levaquin> Augmentin 875 mg take one pill twice daily  X 10 days - Prednisone 10 mg take  4 each am x 2 days,   2 each am x 2 days,  1 each am x 2 days and stop    11/08/2015 acute extended ov/Courtenay Creger re: ? Sinobronchial asthma maint on dulera 200 2bid  Chief Complaint  Patient presents with  . Acute Visit    results of CT scan that was done yesterday; coughing has stopped; clear discharge from nasal passage; sob  sleep well but bad am cough/congestion each day, really not free of this symptom since Oct 2016 and previously some better on abx but not now  Doe = MMRC2 = can't walk a nl pace on a flat grade s sob   No obvious day to day or daytime variabilty or assoc   mucus plugs  or cp or   overt  hb symptoms. No unusual exp hx or h/o childhood pna/ asthma or knowledge of premature birth.  Also denies any obvious fluctuation of symptoms with weather or environmental changes or other aggravating or alleviating factors except as outlined above   Current Medications, Allergies, Complete Past Medical History, Past Surgical History,  Family History, and Social History were reviewed in [facility] record.  ROS  The following are not active  complaints unless bolded sore throat, dysphagia, dental problems, itching, sneezing,  nasal congestion or excess/ purulent secretions, ear ache,   fever, chills, sweats, unintended wt loss, pleuritic or exertional cp, hemoptysis,  orthopnea pnd or leg swelling, presyncope, palpitations, heartburn, abdominal pain, anorexia, nausea, vomiting, diarrhea  or change in bowel or urinary habits, change in stools or urine, dysuria,hematuria,  rash, arthralgias, visual complaints, headache, numbness weakness or ataxia or problems with walking or coordination,  change in mood/affect or memory.          Objective:   Physical Exam  amb robust wm nad mod hoarse   10/03/2013        175  > 10/08/2015  176 >  10/21/2015 173 > 10/25/2015   173 > 11/08/2015 170  07/05/13 176 lb (79.833 kg)  06/26/13 173 lb 12.8 oz (78.835 kg)  06/16/13 174 lb (78.926 kg)        HEENT: nl dentition  and oropharynx. Nl external ear canals without cough reflex- mucopurulent discharge both turnbinates R> L   NECK :  without JVD/Nodes/TM/ nl carotid upstrokes bilaterally   LUNGS: no acc muscle use,  slt barrel contour chest with late exp wheeze bilaterally     CV:  RRR  no s3 or murmur or increase in P2, no edema   ABD:  soft and nontender with nl inspiratory excursion in the supine position. No bruits or organomegaly, bowel sounds nl  MS:  Nl gait/ ext warm without deformities, calf tenderness, cyanosis or clubbing No obvious joint restrictions   SKIN: warm and dry without lesions    NEURO:  alert, approp, nl sensorium with  no motor deficits           I personally reviewed images and agree with radiology impression as follows:  CXR: 10/21/2015  No acute abnormality. Chronic obstructive and interstitial lung disease.  Sinus CT 11/08/2015  There is lobulated mucous material layering at  the base of the left maxillary sinus. Ostiomeatal complexes are grossly clear. There is dense mucosal thickening in the left maxillary sinus. Right maxillary sinus contains very minimal mucosal thickening. Minimal mucosal thickening in the right maxillary sinus. Mild mucosal thickening in the frontal sinuses. No obvious bony destruction. No obvious fracture.   Assessment & Plan:

## 2015-11-13 ENCOUNTER — Encounter: Payer: Self-pay | Admitting: Internal Medicine

## 2015-11-13 NOTE — Assessment & Plan Note (Addendum)
Referred to ent 11/08/2015  As refractory to standard (augmentin)

## 2015-11-13 NOTE — Assessment & Plan Note (Signed)
-   PFT's 07/02/11  FEV1  1.41 (55%) ratio 66 and 12% better p B2 with DLCO 96% - PFTs  07/05/2013 FEV1  1.05 and 1.31 p B2 (24%) with DLCO 59 corrects to 84%  - Spiriva and other mdi result in hoarseness - hfa 05/25/2013 75% p coaching>  90% 07/05/13  - added gerd rx 05/25/2013 >> improved 07/05/13  - rx dulera 200 2bid 07/05/13 > better 10/03/2013  - 10/08/2015  extensive coaching HFA effectiveness =  90% - 10/08/2015 added GERD rx back due to pm strangling/ hoarseness  - 10/25/2015 added flutter - 10/25/2015  extensive coaching HFA effectiveness =    90%   Poorly controlled since Oct 2016 likely due to sinus dz (see separate a/p)   I had an extended discussion with the patient reviewing all relevant studies completed to date and  lasting 15 to 20 minutes of a 25 minute visit    Each maintenance medication was reviewed in detail including most importantly the difference between maintenance and prns and under what circumstances the prns are to be triggered using an action plan format that is not reflected in the computer generated alphabetically organized AVS.    Please see instructions for details which were reviewed in writing and the patient given a copy highlighting the part that I personally wrote and discussed at today's ov.

## 2015-11-19 ENCOUNTER — Other Ambulatory Visit: Payer: Self-pay | Admitting: Internal Medicine

## 2015-11-27 DIAGNOSIS — J341 Cyst and mucocele of nose and nasal sinus: Secondary | ICD-10-CM | POA: Diagnosis not present

## 2015-11-27 DIAGNOSIS — J343 Hypertrophy of nasal turbinates: Secondary | ICD-10-CM | POA: Diagnosis not present

## 2015-11-27 DIAGNOSIS — J323 Chronic sphenoidal sinusitis: Secondary | ICD-10-CM | POA: Diagnosis not present

## 2015-12-02 ENCOUNTER — Other Ambulatory Visit: Payer: Self-pay | Admitting: *Deleted

## 2015-12-02 DIAGNOSIS — J441 Chronic obstructive pulmonary disease with (acute) exacerbation: Secondary | ICD-10-CM

## 2015-12-02 DIAGNOSIS — J449 Chronic obstructive pulmonary disease, unspecified: Secondary | ICD-10-CM

## 2015-12-03 ENCOUNTER — Other Ambulatory Visit: Payer: Self-pay | Admitting: Internal Medicine

## 2015-12-03 ENCOUNTER — Ambulatory Visit: Payer: PPO | Admitting: Internal Medicine

## 2015-12-12 ENCOUNTER — Telehealth: Payer: Self-pay | Admitting: Internal Medicine

## 2015-12-12 ENCOUNTER — Encounter: Payer: Self-pay | Admitting: Family Medicine

## 2015-12-12 ENCOUNTER — Ambulatory Visit (INDEPENDENT_AMBULATORY_CARE_PROVIDER_SITE_OTHER): Payer: PPO | Admitting: Family Medicine

## 2015-12-12 VITALS — BP 132/78 | HR 76 | Temp 98.3°F | Ht 69.0 in | Wt 171.2 lb

## 2015-12-12 DIAGNOSIS — R5382 Chronic fatigue, unspecified: Secondary | ICD-10-CM | POA: Diagnosis not present

## 2015-12-12 NOTE — Telephone Encounter (Signed)
Patient Name: Phillip Jordan DOB: 08-30-1929 Initial Comment Caller states husband has been fatigued for over a month. Had been sick recently and not been the same since. Has COPD. Made an appt for 5/1 but what can he do for now. Nurse Assessment Nurse: Roosvelt Maser, RN, Barnetta Chapel Date/Time (Eastern Time): 12/12/2015 11:34:25 AM Confirm and document reason for call. If symptomatic, describe symptoms. You must click the next button to save text entered. ---wife states husband is recovering from COPD exacerbation. He was put on several abx, and cough has gone away but her is still just fatigued, we are supposed to go out of town and he could not finish his normal gym class today. just not recovering very well Has the patient traveled out of the country within the last 30 days? ---Not Applicable Does the patient have any new or worsening symptoms? ---Yes Will a triage be completed? ---Yes Related visit to physician within the last 2 weeks? ---Yes Does the PT have any chronic conditions? (i.e. diabetes, asthma, etc.) ---Unknown Is this a behavioral health or substance abuse call? ---No Guidelines Guideline Title Affirmed Question Affirmed Notes Weakness (Generalized) and Fatigue Pale skin (pallor) Final Disposition User See Physician within 4 Hours (or PCP triage) Roosvelt Maser, RN, Barnetta Chapel Comments appt with jennifer copeland scheduled today at 1545 Referrals REFERRED TO PCP OFFICE

## 2015-12-12 NOTE — Progress Notes (Signed)
[facility] [address], [place], [place] [address] [phone]  Date:  12/12/2015   Name:  Phillip Jordan   DOB:  [birth year]   MRN:  [id number]  PCP:  Kathlene November, MD    Chief Complaint: Fatigue   History of Present Illness:  Phillip Jordan is a 82 y.o. very pleasant male patient who presents with the following:  Generally a pt of Dr. [name].  He notes that "I'm just so weak, I don't have any stamina."  He has felt this way for about 6 months.  He has not noted any other particular symptoms.  No pain anywhere  He does have chronic lung disease for which he sees Dr. [name] No weight changes His breathing is about the same as usual- he does have some wheezing at baseline  No chest pain or palpitations He is eating ok No changes in his medication.  No unusal stress in his life He is sleeping well, does like to take an afternoon nap [facility].    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  Patient Active Problem List   Diagnosis Date Noted  . Sinusitis, chronic 11/08/2015  . Follow-up --------------PCP NOTES 05/02/2015  . Vertigo 06/26/2014  . Palpitations 08/29/2013  . Other malaise and fatigue 12/13/2012  . Hoarseness of voice 06/17/2011  . Annual physical exam 05/26/2011  . BUNDLE BRANCH BLOCK, RIGHT 01/10/2009  . ALLERGIC RHINITIS 08/16/2007  . Type 2 diabetes, controlled, with retinopathy ([place]) 06/30/2007  . Hyperlipidemia 06/30/2007  . CAD (coronary artery disease) 06/30/2007  . Dissection of aorta ([place]) 06/30/2007  . COPD GOLD III 06/30/2007    Past Medical History  Diagnosis Date  .  COPD with asthma ([place])   . CAD (coronary artery disease)     a. s/p MI 1992. b. Prior hx stenting to RCA/Cx. b. 05/2013: [place] s/p DES to distal RCA then staged DES to prox LAD.   . Diabetes mellitus   . Hyperlipidemia   . Allergic rhinitis   . Aortic dissection ([place])     f/u by cards - AAA with distal dissection.  . Shingles   . Lichen planus     Right midline inferior chest, Dr. [name] Sensing    Past Surgical History  Procedure Laterality Date  . Back surgery  1993  . Cardiac catheterization      left, w/ coronary angiography and left ventriculograpy  . Ptca      w/ placement of drugeluting stent in the distal right coronary artery, and distal Cardiologist: [name]  . Left heart catheterization with coronary angiogram N/A 06/09/2013    Procedure: LEFT HEART CATHETERIZATION WITH CORONARY ANGIOGRAM;  Surgeon: [name], MD;  Location[facility] C[facility] LAB;  Service: Cardiovascular;  Laterality: N/A;  . Percutaneous coronary stent intervention (pci-s) N/A 06/12/2013    Procedure: PERCUTANEOUS CORONARY STENT INTERVENTION (PCI-S);  Surgeon: [name], MD;  Location: [facility] CATH LAB;  Service: Cardiovascular;  Laterality: N/A;  prox LAD  Social History  Substance Use Topics  . Smoking status: Former Smoker -- 1.50 packs/day for 39 years    Types: Cigarettes    Quit date: 08/24/1992  . Smokeless tobacco: Never Used  . Alcohol Use: No    Family History  Problem Relation Age of Onset  . Prostate cancer Neg Hx   . Colon cancer Neg Hx   . Asthma Mother   . Heart disease Mother     CHF  . Cancer Mother     melanoma  . Heart disease Father     MI    No Known Allergies  Medication list has been reviewed and updated.  Current Outpatient Prescriptions on File Prior to Visit  Medication Sig Dispense Refill  . acetaminophen (TYLENOL) 500 MG tablet Take 1,000 mg by mouth every 6 (six) hours as needed for pain. Reported on 08/30/2015    . albuterol (PROAIR HFA) 108 (90  Base) MCG/ACT inhaler 2 puffs every 4 hours as needed only  if your can't catch your breath 1 Inhaler 11  . albuterol (PROVENTIL HFA;VENTOLIN HFA) 108 (90 BASE) MCG/ACT inhaler Inhale 2 puffs into the lungs every 6 (six) hours as needed for wheezing or shortness of breath. 1 Inhaler 0  . aspirin 81 MG tablet Take 1 tablet (81 mg total) by mouth daily.    . clopidogrel (PLAVIX) 75 MG tablet Take 1 tablet (75 mg total) by mouth daily. 90 tablet 2  . famotidine (PEPCID) 20 MG tablet One at bedtime 30 tablet 11  . glucose blood (FREESTYLE TEST STRIPS) test strip Use as instructed 100 each 12  . isosorbide mononitrate (IMDUR) 60 MG 24 hr tablet Take 1 tablet (60 mg total) by mouth 2 (two) times daily. 180 tablet 2  . metFORMIN (GLUCOPHAGE) 500 MG tablet Take 1 tablet (500 mg total) by mouth 2 (two) times daily with a meal. 180 tablet 2  . Misc. Devices (ACAPELLA) MISC Use as directed 1 each 0  . mometasone-formoterol (DULERA) 200-5 MCG/ACT AERO Inhale 2 puffs into the lungs 2 (two) times daily. 1 Inhaler 0  . montelukast (SINGULAIR) 10 MG tablet Take 1 tablet (10 mg total) by mouth at bedtime. 90 tablet 2  . Multiple Vitamin (MULTIVITAMIN WITH MINERALS) TABS tablet Take 1 tablet by mouth daily.    . nitroGLYCERIN (NITROSTAT) 0.4 MG SL tablet Place 1 tablet (0.4 mg total) under the tongue every 5 (five) minutes as needed for chest pain (up to 3 doses only.). 25 tablet 3  . pantoprazole (PROTONIX) 40 MG tablet Take 1 tablet (40 mg total) by mouth daily. Take 30-60 min before first meal of the day 30 tablet 2  . pravastatin (PRAVACHOL) 40 MG tablet Take 1 tablet (40 mg total) by mouth daily. 90 tablet 2  . loratadine (CLARITIN) 10 MG tablet Take 10 mg by mouth daily as needed for allergies.      No current facility-administered medications on file prior to visit.    Review of Systems:  As per HPI- otherwise negative.   Physical Examination: Filed Vitals:   12/12/15 1537  BP: 132/78  Pulse: 76   Temp: 98.3 F (36.8 C)   Filed Vitals:   12/12/15 1537  Height: 5\' 9"  (1.753 m)  Weight: 171 lb 3.2 oz (77.656 kg)   Body mass index is 25.27 kg/(m^2). Ideal Body Weight: Weight in (lb) to have BMI = 25: 168.9  GEN: WDWN, NAD, Non-toxic, A & O x 3, looks well and very spry  for age 29: Atraumatic, Normocephalic. Neck supple. No masses, No LAD.  PEERL, oropharynx wnl Ears and Nose: No external deformity. CV: RRR, No M/G/R. No JVD. No thrill. No extra heart sounds. PULM: CTA B, no wheezes, crackles, rhonchi. No retractions. No resp. distress. No accessory muscle use. ABD: S, NT, ND EXTR: No c/c/e NEURO Normal gait.  PSYCH: Normally interactive. Conversant. Not depressed or anxious appearing.  Calm demeanor.    Assessment and Plan: Chronic fatigue - Plan: Vitamin D (25 hydroxy), Vitamin B12, TSH, CBC here today with complaint of feeling tired for several months.  Discussed that this may be the aging process- however will check labs as above to see if we can find any other explanation  He has been very active during his earlier years- working until age 44- so he may have higher than usual expectations for his energy level at his current life stage   Signed Lamar Blinks, MD

## 2015-12-12 NOTE — Progress Notes (Signed)
Pre visit review using our clinic tool,if applicable. No additional management support is needed unless otherwise documented below in the visit note.  

## 2015-12-12 NOTE — Telephone Encounter (Signed)
Error

## 2015-12-12 NOTE — Telephone Encounter (Signed)
Pt's spouse called in to schedule an appt for pt's experiencing fatique. Scheduled appt. ALSO, transferred call to [facility] to speak with a nurse.

## 2015-12-12 NOTE — Patient Instructions (Signed)
I will check some labs for you to see if there is any cause of your fatigue.

## 2015-12-13 LAB — VITAMIN B12: VITAMIN B 12: 565 pg/mL (ref 211–911)

## 2015-12-13 LAB — CBC
HCT: 40.2 % (ref 39.0–52.0)
Hemoglobin: 13.4 g/dL (ref 13.0–17.0)
MCHC: 33.2 g/dL (ref 30.0–36.0)
MCV: 88.3 fl (ref 78.0–100.0)
Platelets: 256 K/uL (ref 150.0–400.0)
RBC: 4.55 Mil/uL (ref 4.22–5.81)
RDW: 14 % (ref 11.5–15.5)
WBC: 8.2 K/uL (ref 4.0–10.5)

## 2015-12-13 LAB — TSH: TSH: 2.23 u[IU]/mL (ref 0.35–4.50)

## 2015-12-13 LAB — VITAMIN D 25 HYDROXY (VIT D DEFICIENCY, FRACTURES): VITD: 44.39 ng/mL (ref 30.00–100.00)

## 2015-12-20 ENCOUNTER — Telehealth: Payer: Self-pay | Admitting: Internal Medicine

## 2015-12-20 ENCOUNTER — Other Ambulatory Visit: Payer: Self-pay | Admitting: Internal Medicine

## 2015-12-20 NOTE — Telephone Encounter (Signed)
Rx request to pharmacy/SLS  

## 2015-12-20 NOTE — Telephone Encounter (Signed)
Last ov on 11/08/15 with MW Patient Instructions     Please see patient coordinator before you leave today to schedule ent eval   Plan A = Automatic = dulera 200 Take 2 puffs first thing in am and then another 2 puffs about 12 hours later.   Plan B = Backup Only use your albuterol(ventolin/proair) as a rescue medication to be used if you can't catch your breath by resting or doing a relaxed purse lip breathing pattern.  - The less you use it, the better it will work when you need it. - Ok to use up to 2 puffs every 4 hours if you must but call for appointment if use goes up over your usual need - Don't leave home without it !! (think of it like the spare tire for your car)   For cough > mucinex dm up to 1200 mg every 12 hours and use the flutter valve as much as possible    Called spoke with pt's wife. She states that she wants clarification on whether the patient needs to stay on pepcid and Protonix. I explained to her that the protonix has 2 refills and the pepcid has 11 refills. I also informed her that in the pt's last AVS there was no instructions on stopping the medication. She voiced understanding and had no further questions.  Nothing further needed.

## 2015-12-23 ENCOUNTER — Ambulatory Visit: Payer: PPO | Admitting: Internal Medicine

## 2015-12-24 ENCOUNTER — Encounter: Payer: Self-pay | Admitting: Internal Medicine

## 2015-12-24 ENCOUNTER — Ambulatory Visit (INDEPENDENT_AMBULATORY_CARE_PROVIDER_SITE_OTHER): Payer: PPO | Admitting: Internal Medicine

## 2015-12-24 VITALS — BP 130/60 | HR 66 | Temp 97.6°F | Ht 69.0 in | Wt 171.0 lb

## 2015-12-24 DIAGNOSIS — J441 Chronic obstructive pulmonary disease with (acute) exacerbation: Secondary | ICD-10-CM

## 2015-12-24 DIAGNOSIS — J449 Chronic obstructive pulmonary disease, unspecified: Secondary | ICD-10-CM

## 2015-12-24 DIAGNOSIS — R5382 Chronic fatigue, unspecified: Secondary | ICD-10-CM

## 2015-12-24 DIAGNOSIS — Z09 Encounter for follow-up examination after completed treatment for conditions other than malignant neoplasm: Secondary | ICD-10-CM

## 2015-12-24 NOTE — Progress Notes (Signed)
Pre visit review using our clinic review tool, if applicable. No additional management support is needed unless otherwise documented below in the visit note. 

## 2015-12-24 NOTE — Assessment & Plan Note (Signed)
Fatigue: No evidence of active heart disease, normal B12, vitamin D, TSH, CBC. Last  chest x-ray 09-2015: Okay. Sx likely multifactorial including COPD, deconditioning and aging. O2 sat: At rest  98%, after 3 minutes off exertion 89%  Plan:  --O2 Therapy?Patient would be quite reluctant to use oxygen during the daytime, he probably will use it overnight. We'll try to set that pulse oximetry with advance home care --- Recommend Pulmonary rehabilitation , patient agrees, will set up a referral  RTC 02-2016 as recommended

## 2015-12-24 NOTE — Progress Notes (Signed)
Subjective:    Patient ID: Phillip Jordan, male    DOB: November 06, 1929, 82 y.o.   MRN: [id number]  DOS:  12/24/2015 Type of visit - description :  Interval history: Fatigue: Saw Dr. Edilia Bo recently, labs normal. Here because he continued to feel fatigue. When asked exactly to describe his fatigue is mostly dyspnea on exertion.    Review of Systems Denies fever chills No chest pain or lower extremity edema Denies anxiety or depression No snoring History goes to the [facility] and tries  to exercise regularly  Past Medical History  Diagnosis Date  . COPD with asthma ([place])   . CAD (coronary artery disease)     a. s/p MI 1992. b. Prior hx stenting to RCA/Cx. b. 05/2013: [place] s/p DES to distal RCA then staged DES to prox LAD.   . Diabetes mellitus   . Hyperlipidemia   . Allergic rhinitis   . Aortic dissection ([place])     f/u by cards - AAA with distal dissection.  . Shingles   . Lichen planus     Right midline inferior chest, Dr. [name] Sensing    Past Surgical History  Procedure Laterality Date  . Back surgery  1993  . Cardiac catheterization      left, w/ coronary angiography and left ventriculograpy  . Ptca      w/ placement of drugeluting stent in the distal right coronary artery, and distal Cardiologist: [name]  . Left heart catheterization with coronary angiogram N/A 06/09/2013    Procedure: LEFT HEART CATHETERIZATION WITH CORONARY ANGIOGRAM;  Surgeon: [name], MD;  Location: [facility] CATH LAB;  Service: Cardiovascular;  Laterality: N/A;  . Percutaneous coronary stent intervention (pci-s) N/A 06/12/2013    Procedure: PERCUTANEOUS CORONARY STENT INTERVENTION (PCI-S);  Surgeon: [name], MD;  Location: [facility] CATH LAB;  Service: Cardiovascular;  Laterality: N/A;  prox LAD    Social History   Social History  . Marital Status: Married    Spouse Name: N/A  . Number of Children: 3  . Years of Education: N/A   Occupational History  . retired    Social  History Main Topics  . Smoking status: Former Smoker -- 1.50 packs/day for 39 years    Types: Cigarettes    Quit date: 08/24/1992  . Smokeless tobacco: Never Used  . Alcohol Use: No  . Drug Use: No  . Sexual Activity: Not on file   Other Topics Concern  . Not on file   Social History Narrative   Lives w/ wife, does do some  local mission work  .   3 kids, 11 [id number]                     Medication List       This list is accurate as of: 12/24/15  5:10 PM.  Always use your most recent med list.               ACAPELLA Misc  Use as directed     acetaminophen 500 MG tablet  Commonly known as:  TYLENOL  Take 1,000 mg by mouth every 6 (six) hours as needed for pain. Reported on 08/30/2015     albuterol 108 (90 Base) MCG/ACT inhaler  Commonly known as:  PROVENTIL HFA;VENTOLIN HFA  Inhale 2 puffs into the lungs every 6 (six) hours as needed for wheezing or shortness of breath.     albuterol 108 (90 Base) MCG/ACT inhaler  Commonly known as:  PROAIR HFA  2 puffs every 4 hours as needed only  if your can't catch your breath     aspirin 81 MG tablet  Take 1 tablet (81 mg total) by mouth daily.     clopidogrel 75 MG tablet  Commonly known as:  PLAVIX  Take 1 tablet (75 mg total) by mouth daily.     famotidine 20 MG tablet  Commonly known as:  PEPCID  One at bedtime     glucose blood test strip  Commonly known as:  FREESTYLE TEST STRIPS  Use as instructed     isosorbide mononitrate 60 MG 24 hr tablet  Commonly known as:  IMDUR  Take 1 tablet (60 mg total) by mouth 2 (two) times daily.     loratadine 10 MG tablet  Commonly known as:  CLARITIN  Take 10 mg by mouth daily as needed for allergies.     metFORMIN 500 MG tablet  Commonly known as:  GLUCOPHAGE  TAKE ONE TABLET BY MOUTH TWICE DAILY WITH A MEAL     mometasone-formoterol 200-5 MCG/ACT Aero  Commonly known as:  DULERA  Inhale 2 puffs into the lungs 2 (two) times daily.     montelukast 10 MG tablet    Commonly known as:  SINGULAIR  Take 1 tablet (10 mg total) by mouth at bedtime.     multivitamin with minerals Tabs tablet  Take 1 tablet by mouth daily.     nitroGLYCERIN 0.4 MG SL tablet  Commonly known as:  NITROSTAT  Place 1 tablet (0.4 mg total) under the tongue every 5 (five) minutes as needed for chest pain (up to 3 doses only.).     pantoprazole 40 MG tablet  Commonly known as:  PROTONIX  Take 1 tablet (40 mg total) by mouth daily. Take 30-60 min before first meal of the day     pravastatin 40 MG tablet  Commonly known as:  PRAVACHOL  Take 1 tablet (40 mg total) by mouth daily.           Objective:   Physical Exam BP 130/60 mmHg  Pulse 66  Temp(Src) 97.6 F (36.4 C) (Oral)  Ht 5\' 9"  (1.753 m)  Wt 171 lb (77.565 kg)  BMI 25.24 kg/m2  SpO2 98% General:   Well developed, well nourished . NAD.  HEENT:  Normocephalic . Face symmetric, atraumatic. Neck no JVD at 45 Lungs:  Decreased breath sounds Normal respiratory effort, no intercostal retractions, no accessory muscle use. Heart: RRR,  no murmur.  No pretibial edema bilaterally  Skin: Not pale. Not jaundice Neurologic:  alert & oriented X3.  Speech normal, gait appropriate for age and unassisted Psych--  Cognition and judgment appear intact.  Cooperative with normal attention span and concentration.  Behavior appropriate. No anxious or depressed appearing.      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 Ronnald Ramp Chronic hoarseness, status post ENT eval ~ 2012 Normal DEXA 2012  PLAN Fatigue: No evidence of active heart disease, normal B12, vitamin D, TSH, CBC. Last  chest x-ray 09-2015: Okay. Sx likely multifactorial including COPD, deconditioning and aging. O2 sat: At rest  98%, after 3 minutes off  exertion 89%  Plan:  --O2 Therapy?Patient would be quite reluctant to use oxygen during the daytime, he probably will use it overnight.  We'll try to set that pulse oximetry with advance home care --- Recommend Pulmonary rehabilitation , patient agrees, will set up a referral  RTC 02-2016 as recommended

## 2015-12-27 ENCOUNTER — Telehealth: Payer: Self-pay | Admitting: *Deleted

## 2015-12-27 DIAGNOSIS — J441 Chronic obstructive pulmonary disease with (acute) exacerbation: Secondary | ICD-10-CM

## 2015-12-27 NOTE — Telephone Encounter (Signed)
-----   Message from Darlina Guys sent at 12/26/2015  1:49 PM EDT ----- Regarding: RE: Pulse oximetry Dr. [name] could order an Overnight Oximetry test to see if the pt qualifies for nocturnal oxygen. Just have him enter the "For Home Use Only DME Overnight Oximetry" order into Epic then message me when it's done.  I'll pull it and get him scheduled.   Thanks! ----- Message -----    From: Harl Bowie, CMA    Sent: 12/25/2015   6:15 PM      To: Melissa Stenson Subject: Pulse oximetry                                 Hey Melissa,  Dr. [name] wanted to see if this patient would qualify for O2 therapy.  The pt was seen in the office on (Tues.12/24/15) by Dr. [name].  Pt has a history of COPD.  Patient's O2 sat: At rest 98%, after 3 minutes of exertion 89%.   Dr. [name] would like O2 therapy?  He stated that the patient would be quite reluctant to [place] oxygen during the daytime, he probably will use it overnight.  A referral to Pulmonology was placed for pulmonary rehabilitation.  Could you let me know what I need to do.  I will out of the office on tomorrow, but I will be back in the office on Friday.  Thank you!   Levada Dy

## 2015-12-30 NOTE — Telephone Encounter (Signed)
Order placed for the Pulse oximetry, overnight and sent.  Informed Melissa that the order was sent.//AB/CMA

## 2016-01-03 ENCOUNTER — Encounter (HOSPITAL_COMMUNITY): Payer: Self-pay

## 2016-01-03 ENCOUNTER — Encounter (HOSPITAL_COMMUNITY)
Admission: RE | Admit: 2016-01-03 | Discharge: 2016-01-03 | Disposition: A | Discharge status: Home / Self Care | Payer: PPO | Source: Ambulatory Visit | Attending: Internal Medicine | Admitting: Internal Medicine

## 2016-01-03 VITALS — BP 154/72 | HR 64 | Resp 18 | Ht 69.0 in | Wt 169.8 lb

## 2016-01-03 DIAGNOSIS — I252 Old myocardial infarction: Secondary | ICD-10-CM | POA: Diagnosis not present

## 2016-01-03 DIAGNOSIS — Z955 Presence of coronary angioplasty implant and graft: Secondary | ICD-10-CM | POA: Diagnosis not present

## 2016-01-03 DIAGNOSIS — I2510 Atherosclerotic heart disease of native coronary artery without angina pectoris: Secondary | ICD-10-CM | POA: Diagnosis not present

## 2016-01-03 DIAGNOSIS — J449 Chronic obstructive pulmonary disease, unspecified: Secondary | ICD-10-CM | POA: Insufficient documentation

## 2016-01-03 DIAGNOSIS — Z79899 Other long term (current) drug therapy: Secondary | ICD-10-CM | POA: Diagnosis not present

## 2016-01-03 NOTE — Progress Notes (Signed)
Phillip Jordan 82 y.o. male Pulmonary Rehab Orientation Note Patient arrived today in Cardiac and Pulmonary Rehab for orientation to Pulmonary Rehab. He ambulated from [place] parking with little difficulty. He has not been prescribed oxygen for home use. He remains active by participating in [employer] with his wife at the [employer]. Color good, skin warm and dry. Patient is oriented to time and place. Patient's medical history, psychosocial health, and medications reviewed. Psychosocial assessment reveals pt lives with their spouse. Pt is currently retired. Pt hobbies include gardening and being an active member of the choir at his church. Pt reports he has very little to no stress in his life.  Pt does not exhibits signs of depression. PHQ2/9 score 0/NA. Pt shows good  coping skills with positive outlook . He was offered emotional support and reassurance. Will continue to monitor and evaluate progress toward psychosocial goal(s) of remaining. Physical assessment reveals heart rate is normal, Breath sounds clear in the right, expiratory wheezes throughoutt the left. Grip strength equal, strong. Distal pulses palpable. No edema noted. Patient reports he does take medications as prescribed. Patient states he follows a Regular diet. He does take metformin, but states he is "really not diabetic. It is to prevent diabetes since my parents both had diabetes". He does not follow a diabetic diet nor does he watch his simple sugar intake. He will have a nutrition consult with the department RD. The patient reports no specific efforts to gain or lose weight. Patient's weight will be monitored closely. Demonstration and practice of PLB using pulse oximeter. Patient able to return demonstration satisfactorily. Safety and hand hygiene in the exercise area reviewed with patient. Patient voices understanding of the information reviewed. Department expectations discussed with patient and achievable goals were set. The  patient shows enthusiasm about attending the program and we look forward to working with this nice gentleman. The patient is scheduled for a 6 min walk test on Tuesday 5/16 and to begin exercise on Tuesday 5/23 in the 1:30 class.    45 minutes was spent on a variety of activities such as assessment of the patient, obtaining baseline data including height, weight, BMI, and grip strength, verifying medical history, allergies, and current medications, and teaching patient strategies for performing tasks with less respiratory effort with emphasis on pursed lip breathing.

## 2016-01-07 ENCOUNTER — Encounter (HOSPITAL_COMMUNITY)
Admission: RE | Admit: 2016-01-07 | Discharge: 2016-01-07 | Disposition: A | Discharge status: Home / Self Care | Payer: PPO | Source: Ambulatory Visit | Attending: Internal Medicine | Admitting: Internal Medicine

## 2016-01-07 DIAGNOSIS — J449 Chronic obstructive pulmonary disease, unspecified: Secondary | ICD-10-CM

## 2016-01-07 NOTE — Progress Notes (Signed)
Cardiac Individual Treatment Plan  Patient Details  Name: Phillip Jordan MRN: [id number] Date of Birth: Mar 08, 1930 Referring Provider:        Pulmonary Rehab Walk Test from 01/07/2016 in [place]   Referring Provider  Dr. [name]      Initial Encounter Date:       Pulmonary Rehab Walk Test from 01/07/2016 in [place]   Date  01/07/16   Referring Provider  Dr. [name]      Visit Diagnosis: Chronic obstructive pulmonary disease, unspecified COPD type ([place])  Patient's Home Medications on Admission:  Current outpatient prescriptions:  .  acetaminophen (TYLENOL) 500 MG tablet, Take 1,000 mg by mouth every 6 (six) hours as needed for pain. Reported on 08/30/2015, Disp: , Rfl:  .  albuterol (PROAIR HFA) 108 (90 Base) MCG/ACT inhaler, 2 puffs every 4 hours as needed only  if your can't catch your breath, Disp: 1 Inhaler, Rfl: 11 .  albuterol (PROVENTIL HFA;VENTOLIN HFA) 108 (90 BASE) MCG/ACT inhaler, Inhale 2 puffs into the lungs every 6 (six) hours as needed for wheezing or shortness of breath., Disp: 1 Inhaler, Rfl: 0 .  aspirin 81 MG tablet, Take 1 tablet (81 mg total) by mouth daily., Disp: , Rfl:  .  clopidogrel (PLAVIX) 75 MG tablet, Take 1 tablet (75 mg total) by mouth daily., Disp: 90 tablet, Rfl: 2 .  famotidine (PEPCID) 20 MG tablet, One at bedtime, Disp: 30 tablet, Rfl: 11 .  glucose blood (FREESTYLE TEST STRIPS) test strip, Use as instructed, Disp: 100 each, Rfl: 12 .  isosorbide mononitrate (IMDUR) 60 MG 24 hr tablet, Take 1 tablet (60 mg total) by mouth 2 (two) times daily., Disp: 180 tablet, Rfl: 2 .  loratadine (CLARITIN) 10 MG tablet, Take 10 mg by mouth daily as needed for allergies. , Disp: , Rfl:  .  metFORMIN (GLUCOPHAGE) 500 MG tablet, TAKE ONE TABLET BY MOUTH TWICE DAILY WITH A MEAL, Disp: 180 tablet, Rfl: 0 .  Misc. Devices (ACAPELLA) MISC, Use as directed, Disp: 1 each, Rfl: 0 .  mometasone-formoterol (DULERA)  200-5 MCG/ACT AERO, Inhale 2 puffs into the lungs 2 (two) times daily., Disp: 1 Inhaler, Rfl: 0 .  montelukast (SINGULAIR) 10 MG tablet, Take 1 tablet (10 mg total) by mouth at bedtime., Disp: 90 tablet, Rfl: 2 .  Multiple Vitamin (MULTIVITAMIN WITH MINERALS) TABS tablet, Take 1 tablet by mouth daily., Disp: , Rfl:  .  nitroGLYCERIN (NITROSTAT) 0.4 MG SL tablet, Place 1 tablet (0.4 mg total) under the tongue every 5 (five) minutes as needed for chest pain (up to 3 doses only.)., Disp: 25 tablet, Rfl: 3 .  pantoprazole (PROTONIX) 40 MG tablet, Take 1 tablet (40 mg total) by mouth daily. Take 30-60 min before first meal of the day, Disp: 30 tablet, Rfl: 2 .  pravastatin (PRAVACHOL) 40 MG tablet, Take 1 tablet (40 mg total) by mouth daily., Disp: 90 tablet, Rfl: 2  Past Medical History: Past Medical History  Diagnosis Date  . COPD with asthma ([place])   . CAD (coronary artery disease)     a. s/p MI 1992. b. Prior hx stenting to RCA/Cx. b. 05/2013: [place] s/p DES to distal RCA then staged DES to prox LAD.   . Diabetes mellitus   . Hyperlipidemia   . Allergic rhinitis   . Aortic dissection ([place])     f/u by cards - AAA with distal dissection.  . Shingles   .  Lichen planus     Right midline inferior chest, Dr. [name] Sensing    Tobacco Use: History  Smoking status  . Former Smoker -- 1.50 packs/day for 39 years  . Types: Cigarettes  . Quit date: 08/24/1992  Smokeless tobacco  . Never Used    Labs: Recent Review Flowsheet Data    Labs for ITP Cardiac and Pulmonary Rehab Latest Ref Rng 09/18/2013 01/17/2014 06/19/2014 01/02/2015 09/10/2015   Cholestrol 0 - 200 mg/dL 164 - 129 - 170   LDLCALC 0 - 99 mg/dL 95 - 68 - 93   HDL >39.00 mg/dL 39.50 - 31.70(L) - 39.80   Trlycerides 0.0 - 149.0 mg/dL 149.0 - 148.0 - 183.0(H)   Hemoglobin A1c 4.6 - 6.5 % 6.7(H) 6.4 6.5 6.4 6.6(H)      Capillary Blood Glucose: Lab Results  Component Value Date   GLUCAP 110* 06/09/2013   GLUCAP 113* 06/09/2013      Exercise Target Goals: Date: 01/07/16  Exercise Program Goal: Individual exercise prescription set with THRR, safety & activity barriers. Participant demonstrates ability to understand and report RPE using BORG scale, to self-measure pulse accurately, and to acknowledge the importance of the exercise prescription.  Exercise Prescription Goal: Starting with aerobic activity 30 plus minutes a day, 3 days per week for initial exercise prescription. Provide home exercise prescription and guidelines that participant acknowledges understanding prior to discharge.  Activity Barriers & Risk Stratification:     Activity Barriers & Cardiac Risk Stratification - 01/03/16 1131    Activity Barriers & Cardiac Risk Stratification   Activity Barriers Shortness of Breath;Arthritis      6 Minute Walk:     6 Minute Walk      01/07/16 1559       6 Minute Walk   Phase Initial     Distance 1610 feet     Walk Time 6 minutes     # of Rest Breaks 0     MPH 3.04     METS 3.53     RPE 14     Perceived Dyspnea  3     Symptoms No     Resting HR 70 bpm     Resting BP 152/88 mmHg     Max Ex. HR 109 bpm     Max Ex. BP 197/74 mmHg     2 Minute Post BP 178/81 mmHg     Interval HR   Baseline HR 70     1 Minute HR 89     2 Minute HR 90     3 Minute HR 92     4 Minute HR 100     5 Minute HR 102     6 Minute HR 109     2 Minute Post HR 84     Interval Oxygen   Baseline Oxygen Saturation % 97 %     Baseline Liters of Oxygen 0 L     1 Minute Oxygen Saturation % 95 %     1 Minute Liters of Oxygen 0 L     2 Minute Oxygen Saturation % 93 %     2 Minute Liters of Oxygen 0 L     3 Minute Oxygen Saturation % 90 %     3 Minute Liters of Oxygen 0 L     4 Minute Oxygen Saturation % 89 %     4 Minute Liters of Oxygen 0 L     5 Minute Oxygen Saturation % 89 %  5 Minute Liters of Oxygen 0 L     6 Minute Oxygen Saturation % 87 %     6 Minute Liters of Oxygen 0 L     2 Minute Post Oxygen  Saturation % 96 %     2 Minute Post Liters of Oxygen 0 L        Initial Exercise Prescription:     Initial Exercise Prescription - 01/07/16 1600    Date of Initial Exercise RX and Referring Provider   Date 01/07/16   Referring Provider Dr. [name]   Oxygen   Oxygen --  room air   Treadmill   MPH 2   Grade 1   Minutes 15   NuStep   Level 3   Minutes 15   METs 1.8   Rower   Level 1   Minutes 15   Prescription Details   Frequency (times per week) 2   Duration Progress to 45 minutes of aerobic exercise without signs/symptoms of physical distress   Intensity   THRR 40-80% of Max Heartrate 55-110   Ratings of Perceived Exertion 11-13   Perceived Dyspnea 0-4   Progression   Progression Continue to progress workloads to maintain intensity without signs/symptoms of physical distress.   Resistance Training   Training Prescription Yes   Weight blue bands   Reps 10-12      Perform Capillary Blood Glucose checks as needed.  Exercise Prescription Changes:   Exercise Comments:   Discharge Exercise Prescription (Final Exercise Prescription Changes):   Nutrition:  Target Goals: Understanding of nutrition guidelines, daily intake of sodium 1500mg , cholesterol 200mg , calories 30% from fat and 7% or less from saturated fats, daily to have 5 or more servings of fruits and vegetables.  Biometrics:    Nutrition Therapy Plan and Nutrition Goals:   Nutrition Discharge: Nutrition Scores:   Nutrition Goals Re-Evaluation:   Psychosocial: Target Goals: Acknowledge presence or absence of depression, maximize coping skills, provide positive support system. Participant is able to verbalize types and ability to use techniques and skills needed for reducing stress and depression.  Initial Review & Psychosocial Screening:     Initial Psych Review & Screening - 01/03/16 1147    Family Dynamics   Good Support System? Yes   Barriers   Psychosocial barriers to participate in  program There are no identifiable barriers or psychosocial needs.   Screening Interventions   Interventions Encouraged to exercise      Quality of Life Scores:   PHQ-9:     Recent Review Flowsheet Data    Depression screen [facility] 2/9 01/03/2016 12/12/2015 05/02/2015 01/02/2015 09/18/2013   Decreased Interest 0 0 0 0 0   Down, Depressed, Hopeless 0 0 0 0 0   PHQ - 2 Score 0 0 0 0 0      Psychosocial Evaluation and Intervention:   Psychosocial Re-Evaluation:   Vocational Rehabilitation: Provide vocational rehab assistance to qualifying candidates.   Vocational Rehab Evaluation & Intervention:   Education: Education Goals: Education classes will be provided on a weekly basis, covering required topics. Participant will state understanding/return demonstration of topics presented.  Learning Barriers/Preferences:     Learning Barriers/Preferences - 01/03/16 1132    Learning Barriers/Preferences   Learning Barriers None   Learning Preferences Group Instruction;Individual Instruction;Skilled Demonstration;Verbal Instruction;Video;Written Material      Education Topics: Count Your Pulse:  -Group instruction provided by verbal instruction, demonstration, patient participation and written materials to support subject.  Instructors address importance of being able  to find your pulse and how to count your pulse when at home without a heart monitor.  Patients get hands on experience counting their pulse with staff help and individually.   Heart Attack, Angina, and Risk Factor Modification:  -Group instruction provided by verbal instruction, video, and written materials to support subject.  Instructors address signs and symptoms of angina and heart attacks.    Also discuss risk factors for heart disease and how to make changes to improve heart health risk factors.   Functional Fitness:  -Group instruction provided by verbal instruction, demonstration, patient participation, and written  materials to support subject.  Instructors address safety measures for doing things around the house.  Discuss how to get up and down off the floor, how to pick things up properly, how to safely get out of a chair without assistance, and balance training.   Meditation and Mindfulness:  -Group instruction provided by verbal instruction, patient participation, and written materials to support subject.  Instructor addresses importance of mindfulness and meditation practice to help reduce stress and improve awareness.  Instructor also leads participants through a meditation exercise.    Stretching for Flexibility and Mobility:  -Group instruction provided by verbal instruction, patient participation, and written materials to support subject.  Instructors lead participants through series of stretches that are designed to increase flexibility thus improving mobility.  These stretches are additional exercise for major muscle groups that are typically performed during regular warm up and cool down.   Hands Only CPR Anytime:  -Group instruction provided by verbal instruction, video, patient participation and written materials to support subject.  Instructors co-teach with AHA video for hands only CPR.  Participants get hands on experience with mannequins.   Nutrition I class: Heart Healthy Eating:  -Group instruction provided by PowerPoint slides, verbal discussion, and written materials to support subject matter. The instructor gives an explanation and review of the Therapeutic Lifestyle Changes diet recommendations, which includes a discussion on lipid goals, dietary fat, sodium, fiber, plant stanol/sterol esters, sugar, and the components of a well-balanced, healthy diet.   Nutrition II class: Lifestyle Skills:  -Group instruction provided by PowerPoint slides, verbal discussion, and written materials to support subject matter. The instructor gives an explanation and review of label reading, grocery  shopping for heart health, heart healthy recipe modifications, and ways to make healthier choices when eating out.   Diabetes Question & Answer:  -Group instruction provided by PowerPoint slides, verbal discussion, and written materials to support subject matter. The instructor gives an explanation and review of diabetes co-morbidities, pre- and post-prandial blood glucose goals, pre-exercise blood glucose goals, signs, symptoms, and treatment of hypoglycemia and hyperglycemia, and foot care basics.   Diabetes Blitz:  -Group instruction provided by PowerPoint slides, verbal discussion, and written materials to support subject matter. The instructor gives an explanation and review of the physiology behind type 1 and type 2 diabetes, diabetes medications and rational behind using different medications, pre- and post-prandial blood glucose recommendations and Hemoglobin A1c goals, diabetes diet, and exercise including blood glucose guidelines for exercising safely.    Portion Distortion:  -Group instruction provided by PowerPoint slides, verbal discussion, written materials, and food models to support subject matter. The instructor gives an explanation of serving size versus portion size, changes in portions sizes over the last 20 years, and what consists of a serving from each food group.   Stress Management:  -Group instruction provided by verbal instruction, video, and written materials to support subject matter.  Instructors review role of stress in heart disease and how to cope with stress positively.     Exercising on Your Own:  -Group instruction provided by verbal instruction, power point, and written materials to support subject.  Instructors discuss benefits of exercise, components of exercise, frequency and intensity of exercise, and end points for exercise.  Also discuss use of nitroglycerin and activating EMS.  Review options of places to exercise outside of rehab.  Review guidelines  for sex with heart disease.   Cardiac Drugs I:  -Group instruction provided by verbal instruction and written materials to support subject.  Instructor reviews cardiac drug classes: antiplatelets, anticoagulants, beta blockers, and statins.  Instructor discusses reasons, side effects, and lifestyle considerations for each drug class.   Cardiac Drugs II:  -Group instruction provided by verbal instruction and written materials to support subject.  Instructor reviews cardiac drug classes: angiotensin converting enzyme inhibitors (ACE-I), angiotensin II receptor blockers (ARBs), nitrates, and calcium channel blockers.  Instructor discusses reasons, side effects, and lifestyle considerations for each drug class.   Anatomy and Physiology of the Circulatory System:  -Group instruction provided by verbal instruction, video, and written materials to support subject.  Reviews functional anatomy of heart, how it relates to various diagnoses, and what role the heart plays in the overall system.   Knowledge Questionnaire Score:   Core Components/Risk Factors/Patient Goals at Admission:     Personal Goals and Risk Factors at Admission - 01/03/16 1140    Core Components/Risk Factors/Patient Goals on Admission   Increase Strength and Stamina Yes   Intervention Provide advice, education, support and counseling about physical activity/exercise needs.;Develop an individualized exercise prescription for aerobic and resistive training based on initial evaluation findings, risk stratification, comorbidities and participant's personal goals.   Expected Outcomes Achievement of increased cardiorespiratory fitness and enhanced flexibility, muscular endurance and strength shown through measurements of functional capacity and personal statement of participant.   Develop more efficient breathing techniques such as purse lipped breathing and diaphragmatic breathing; and practicing self-pacing with activity Yes    Intervention Provide education, demonstration and support about specific breathing techniuqes utilized for more efficient breathing. Include techniques such as pursed lipped breathing, diaphragmatic breathing and self-pacing activity.   Expected Outcomes Short Term: Participant will be able to demonstrate and use breathing techniques as needed throughout daily activities.   Increase knowledge of respiratory medications and ability to use respiratory devices properly  Yes   Intervention Provide education and demonstration as needed of appropriate use of medications, inhalers, and oxygen therapy.   Expected Outcomes Short Term: Achieves understanding of medications use. Understands that oxygen is a medication prescribed by physician. Demonstrates appropriate use of inhaler and oxygen therapy.   Personal Goal Other Yes   Personal Goal have more energy to do the things I enjoy, gardening, singing in the choir and remain active    Intervention increase workloads on the exercise equiptment as tolerated inorder to increase statmin and strength and to decrease shortness of breath with exertion   Expected Outcomes patient state he has more energy      Core Components/Risk Factors/Patient Goals Review:    Core Components/Risk Factors/Patient Goals at Discharge (Final Review):    ITP Comments:   Comments: Patient's blood pressure was elevated during 6MWT. Rest: 152/88. At the end of walk test 197/74. Will continue to monitor.

## 2016-01-10 ENCOUNTER — Telehealth: Payer: Self-pay | Admitting: *Deleted

## 2016-01-10 NOTE — Telephone Encounter (Signed)
Benefits of potential oxygen therapy discussed with the patient, declined ONO. Will discuss further when he comes back

## 2016-01-10 NOTE — Telephone Encounter (Signed)
-----   Message from Phillip Jordan sent at 01/06/2016  3:57 PM EDT ----- Good afternoon. This patient has declined the ONO you ordered.  Please let us know if we can do anything further to help. Thank you [place]

## 2016-01-10 NOTE — Telephone Encounter (Signed)
Please advise.//AB/CMA 

## 2016-01-14 ENCOUNTER — Encounter (HOSPITAL_COMMUNITY)
Admission: RE | Admit: 2016-01-14 | Discharge: 2016-01-14 | Disposition: A | Discharge status: Home / Self Care | Payer: PPO | Source: Ambulatory Visit | Attending: Internal Medicine | Admitting: Internal Medicine

## 2016-01-14 VITALS — Wt 170.9 lb

## 2016-01-14 DIAGNOSIS — J449 Chronic obstructive pulmonary disease, unspecified: Secondary | ICD-10-CM

## 2016-01-14 NOTE — Progress Notes (Signed)
Daily Session Note  Patient Details  Name: RONDLE LOHSE MRN: [id number] Date of Birth: 1930/06/09 Referring Provider:        Pulmonary Rehab Walk Test from 01/07/2016 in [place]   Referring Provider  Dr. [name]      Encounter Date: 01/14/2016  Check In:     Session Check In - 01/14/16 1541    Check-In   Location [facility]   Staff Present Su Hilt, MS, ACSM RCEP, Exercise Physiologist;Teola Felipe Rollene Rotunda, RN, BSN   Supervising physician immediately available to respond to emergencies [facility] immediately available   Physician(s) Dr. Marily Memos   Medication changes reported     No   Fall or balance concerns reported    No   Warm-up and Cool-down Performed as group-led instruction   Resistance Training Performed Yes   VAD Patient? No   Pain Assessment   Currently in Pain? No/denies   Multiple Pain Sites No      Capillary Blood Glucose: No results found for this or any previous visit (from the past 24 hour(s)).     POCT Glucose - 01/14/16 1546    POCT Blood Glucose   Pre-Exercise 118 mg/dL   Post-Exercise 104 mg/dL         Exercise Prescription Changes - 01/14/16 1500    Response to Exercise   Blood Pressure (Admit) 140/64 mmHg   Blood Pressure (Exercise) 180/80 mmHg   Blood Pressure (Exit) 124/85 mmHg   Heart Rate (Admit) 66 bpm   Heart Rate (Exercise) 98 bpm   Heart Rate (Exit) 78 bpm   Oxygen Saturation (Admit) 98 %   Oxygen Saturation (Exercise) 90 %   Oxygen Saturation (Exit) 94 %   Rating of Perceived Exertion (Exercise) 13   Perceived Dyspnea (Exercise) 2   Symptoms hypertension   Duration Progress to 45 minutes of aerobic exercise without signs/symptoms of physical distress   Intensity Other (comment)  40-80% HRR   Resistance Training   Training Prescription Yes   Weight blue bands   Reps 10-12   Oxygen   Oxygen --  room air   Treadmill   MPH 2   Grade 1   Minutes 15   NuStep   Level 3   Minutes 15   METs 2.3   Rower   Level 1   Minutes 15     Goals Met:  Queuing for purse lip breathing No report of cardiac concerns or symptoms Strength training completed today  Goals Unmet:  BP  Comments: Service time is from 1330 to 1500   Dr. Rush Farmer is Medical Director for Pulmonary Rehab at [facility].

## 2016-01-16 ENCOUNTER — Encounter (HOSPITAL_COMMUNITY)
Admission: RE | Admit: 2016-01-16 | Discharge: 2016-01-16 | Disposition: A | Discharge status: Home / Self Care | Payer: PPO | Source: Ambulatory Visit | Attending: Internal Medicine | Admitting: Internal Medicine

## 2016-01-16 DIAGNOSIS — J449 Chronic obstructive pulmonary disease, unspecified: Secondary | ICD-10-CM | POA: Diagnosis not present

## 2016-01-16 NOTE — Progress Notes (Signed)
Daily Session Note  Patient Details  Name: Phillip Jordan MRN: [id number] Date of Birth: Aug 07, 1930 Referring Provider:        Pulmonary Rehab Walk Test from 01/07/2016 in [place]   Referring Provider  Dr. [name]      Encounter Date: 01/16/2016  Check In:     Session Check In - 01/16/16 1351    Check-In   Location [facility]   Staff Present Rosebud Poles, RN, BSN;Molly diVincenzo, MS, ACSM RCEP, Exercise Physiologist;Lisa Ysidro Evert, RN;Portia Rollene Rotunda, RN, BSN   Supervising physician immediately available to respond to emergencies [facility] immediately available   Physician(s) Dr. Eliseo Squires   Medication changes reported     No   Fall or balance concerns reported    No   Warm-up and Cool-down Performed as group-led instruction   Resistance Training Performed Yes   VAD Patient? No   Pain Assessment   Currently in Pain? No/denies   Multiple Pain Sites No      Capillary Blood Glucose: No results found for this or any previous visit (from the past 24 hour(s)).      Exercise Prescription Changes - 01/16/16 1500    Exercise Review   Progression Yes   Response to Exercise   Blood Pressure (Admit) 154/74 mmHg   Blood Pressure (Exercise) 132/64 mmHg   Blood Pressure (Exit) 112/64 mmHg   Heart Rate (Admit) 73 bpm   Heart Rate (Exercise) 93 bpm   Heart Rate (Exit) 73 bpm   Oxygen Saturation (Admit) 93 %   Oxygen Saturation (Exercise) 90 %   Oxygen Saturation (Exit) 97 %   Rating of Perceived Exertion (Exercise) 11   Perceived Dyspnea (Exercise) 1   Duration Progress to 45 minutes of aerobic exercise without signs/symptoms of physical distress   Intensity THRR unchanged   Resistance Training   Training Prescription Yes   Weight blue bands   Reps 10-12   Interval Training   Interval Training No   Treadmill   MPH 2.2   Grade 2   Minutes 15   Rower   Level 1   Minutes 15     Goals Met:  Exercise tolerated  well Strength training completed today  Goals Unmet:  Not Applicable  Comments: Service time is from1330** to 1515    Dr. Rush Farmer is Medical Director for Pulmonary Rehab at [facility].

## 2016-01-17 ENCOUNTER — Other Ambulatory Visit: Payer: Self-pay | Admitting: Internal Medicine

## 2016-01-21 ENCOUNTER — Encounter (HOSPITAL_COMMUNITY)
Admission: RE | Admit: 2016-01-21 | Discharge: 2016-01-21 | Disposition: A | Discharge status: Home / Self Care | Payer: PPO | Source: Ambulatory Visit | Attending: Internal Medicine | Admitting: Internal Medicine

## 2016-01-21 VITALS — Wt 171.1 lb

## 2016-01-21 DIAGNOSIS — J449 Chronic obstructive pulmonary disease, unspecified: Secondary | ICD-10-CM

## 2016-01-21 NOTE — Progress Notes (Signed)
Daily Session Note  Patient Details  Name: Phillip Jordan MRN: [id number] Date of Birth: 1930/05/07 Referring Provider:        Pulmonary Rehab Walk Test from 01/07/2016 in [place]   Referring Provider  Dr. [name]      Encounter Date: 01/21/2016  Check In:     Session Check In - 01/21/16 1337    Check-In   Location [facility]   Staff Present Rosebud Poles, RN, Luisa Hart, RN, BSN;Ramon Dredge, RN, MHA;Abdulah Iqbal, MS, ACSM RCEP, Exercise Physiologist   Supervising physician immediately available to respond to emergencies [facility] immediately available   Physician(s) Dr. Marily Memos   Medication changes reported     No   Fall or balance concerns reported    No   Warm-up and Cool-down Performed as group-led instruction   Resistance Training Performed Yes   VAD Patient? No   Pain Assessment   Currently in Pain? No/denies   Multiple Pain Sites No      Capillary Blood Glucose: No results found for this or any previous visit (from the past 24 hour(s)).     POCT Glucose - 01/21/16 1521    POCT Blood Glucose   Pre-Exercise 117 mg/dL   Post-Exercise 116 mg/dL         Exercise Prescription Changes - 01/21/16 1500    Response to Exercise   Blood Pressure (Admit) 144/68 mmHg   Blood Pressure (Exercise) 144/66 mmHg   Blood Pressure (Exit) 122/66 mmHg   Heart Rate (Admit) 73 bpm   Heart Rate (Exercise) 95 bpm   Heart Rate (Exit) 70 bpm   Oxygen Saturation (Admit) 94 %   Oxygen Saturation (Exercise) 91 %   Oxygen Saturation (Exit) 93 %   Rating of Perceived Exertion (Exercise) 11   Perceived Dyspnea (Exercise) 1   Duration Progress to 45 minutes of aerobic exercise without signs/symptoms of physical distress   Intensity THRR unchanged   Resistance Training   Training Prescription Yes   Weight blue bands   Reps 10-12   Interval Training   Interval Training No   Treadmill   MPH 2.2   Grade 2   Minutes 15   NuStep   Level 4   Minutes 15   METs 2.8   Rower   Level 1   Minutes 15     Goals Met:  Exercise tolerated well No report of cardiac concerns or symptoms Strength training completed today  Goals Unmet:  Not Applicable  Comments: Service time is from 1:30pm to 3:00pm    Dr. Rush Farmer is Medical Director for Pulmonary Rehab at [facility].

## 2016-01-22 ENCOUNTER — Ambulatory Visit: Payer: PPO | Admitting: Internal Medicine

## 2016-01-23 ENCOUNTER — Encounter (HOSPITAL_COMMUNITY)
Admission: RE | Admit: 2016-01-23 | Discharge: 2016-01-23 | Disposition: A | Discharge status: Home / Self Care | Payer: PPO | Source: Ambulatory Visit | Attending: Internal Medicine | Admitting: Internal Medicine

## 2016-01-23 VITALS — Wt 172.2 lb

## 2016-01-23 DIAGNOSIS — Z79899 Other long term (current) drug therapy: Secondary | ICD-10-CM | POA: Diagnosis not present

## 2016-01-23 DIAGNOSIS — I2510 Atherosclerotic heart disease of native coronary artery without angina pectoris: Secondary | ICD-10-CM | POA: Insufficient documentation

## 2016-01-23 DIAGNOSIS — J449 Chronic obstructive pulmonary disease, unspecified: Secondary | ICD-10-CM | POA: Diagnosis not present

## 2016-01-23 DIAGNOSIS — I252 Old myocardial infarction: Secondary | ICD-10-CM | POA: Diagnosis not present

## 2016-01-23 DIAGNOSIS — Z955 Presence of coronary angioplasty implant and graft: Secondary | ICD-10-CM | POA: Insufficient documentation

## 2016-01-23 NOTE — Progress Notes (Signed)
Daily Session Note  Patient Details  Name: Phillip Jordan MRN: [id number] Date of Birth: 04/26/1930 Referring Provider:        Pulmonary Rehab Walk Test from 01/07/2016 in [facility]   Referring Provider  Dr. [name]      Encounter Date: 01/23/2016  Check In:     Session Check In - 01/23/16 1345    Check-In   Location [facility]   Staff Present Joan Behrens, RN, BSN;Lisa Hughes, RN;Portia Payne, RN, BSN;Annedrea Stackhouse, RN, MHA;Molly diVincenzo, MS, ACSM RCEP, Exercise Physiologist   Supervising physician immediately available to respond to emergencies [facility] immediately available   Physician(s) Dr. Ikram   Medication changes reported     No   Fall or balance concerns reported    No   Warm-up and Cool-down Performed as group-led instruction   Resistance Training Performed Yes   VAD Patient? No   Pain Assessment   Currently in Pain? No/denies   Multiple Pain Sites No      Capillary Blood Glucose: No results found for this or any previous visit (from the past 24 hour(s)).      Exercise Prescription Changes - 01/23/16 1600    Exercise Review   Progression Yes   Response to Exercise   Blood Pressure (Admit) 128/70 mmHg   Blood Pressure (Exercise) 124/60 mmHg   Blood Pressure (Exit) 126/64 mmHg   Heart Rate (Admit) 63 bpm   Heart Rate (Exercise) 77 bpm   Heart Rate (Exit) 72 bpm   Oxygen Saturation (Admit) 96 %   Oxygen Saturation (Exercise) 91 %   Oxygen Saturation (Exit) 95 %   Rating of Perceived Exertion (Exercise) 11   Perceived Dyspnea (Exercise) 0   Duration Progress to 45 minutes of aerobic exercise without signs/symptoms of physical distress   Intensity THRR unchanged   Resistance Training   Training Prescription Yes   Weight blue bands   Reps 10-12   Interval Training   Interval Training No   Treadmill   MPH 2.2   Grade 3   Minutes 15   NuStep   Level 3   Minutes 15   METs 2.5      Goals Met:  Exercise tolerated well No report of cardiac concerns or symptoms Strength training completed today  Goals Unmet:  Not Applicable  Comments: Service time is from 1330 to 1530    Dr. Wesam G. Yacoub is Medical Director for Pulmonary Rehab at [facility]. 

## 2016-01-27 ENCOUNTER — Other Ambulatory Visit: Payer: Self-pay | Admitting: Internal Medicine

## 2016-01-28 ENCOUNTER — Encounter (HOSPITAL_COMMUNITY)
Admission: RE | Admit: 2016-01-28 | Discharge: 2016-01-28 | Disposition: A | Discharge status: Home / Self Care | Payer: PPO | Source: Ambulatory Visit | Attending: Internal Medicine | Admitting: Internal Medicine

## 2016-01-28 VITALS — Wt 171.3 lb

## 2016-01-28 DIAGNOSIS — J449 Chronic obstructive pulmonary disease, unspecified: Secondary | ICD-10-CM

## 2016-01-28 NOTE — Progress Notes (Signed)
Daily Session Note  Patient Details  Name: Phillip Jordan MRN: [id number] Date of Birth: 16-Oct-1929 Referring Provider:        Pulmonary Rehab Walk Test from 01/07/2016 in [place]   Referring Provider  Dr. [name]      Encounter Date: 01/28/2016  Check In:     Session Check In - 01/28/16 1335    Check-In   Location [facility]   Staff Present Rosebud Poles, RN, BSN;Adell Panek Ysidro Evert, RN;Portia Rollene Rotunda, RN, BSN;Ramon Dredge, RN, MHA;Molly diVincenzo, MS, ACSM RCEP, Exercise Physiologist   Supervising physician immediately available to respond to emergencies [facility] immediately available   Physician(s) Dr. Marily Memos   Medication changes reported     No   Fall or balance concerns reported    No   Warm-up and Cool-down Performed as group-led instruction   Resistance Training Performed Yes   VAD Patient? No   Pain Assessment   Currently in Pain? No/denies   Multiple Pain Sites No      Capillary Blood Glucose: No results found for this or any previous visit (from the past 24 hour(s)).      Exercise Prescription Changes - 01/28/16 1500    Exercise Review   Progression Yes   Response to Exercise   Blood Pressure (Admit) 148/70 mmHg   Blood Pressure (Exercise) 190/84 mmHg  recheck 172/80   Blood Pressure (Exit) 124/62 mmHg   Heart Rate (Admit) 70 bpm   Heart Rate (Exercise) 101 bpm   Heart Rate (Exit) 75 bpm   Oxygen Saturation (Admit) 98 %   Oxygen Saturation (Exercise) 90 %   Oxygen Saturation (Exit) 94 %   Rating of Perceived Exertion (Exercise) 13   Perceived Dyspnea (Exercise) 0   Duration Progress to 45 minutes of aerobic exercise without signs/symptoms of physical distress   Intensity THRR unchanged   Progression   Progression Continue to progress workloads to maintain intensity without signs/symptoms of physical distress.   Resistance Training   Training Prescription Yes   Weight blue bands   Reps 10-12   Interval Training   Interval Training No   Treadmill   MPH 2.4   Grade 3   Minutes 15   NuStep   Level 5   Minutes 15   METs 3.2   Rower   Level 1   Minutes 15     Goals Met:  Exercise tolerated well No report of cardiac concerns or symptoms Strength training completed today  Goals Unmet:  BP  Comments: Service time is from 1330 to 1500    Dr. Rush Farmer is Medical Director for Pulmonary Rehab at [facility].

## 2016-01-30 ENCOUNTER — Encounter (HOSPITAL_COMMUNITY)
Admission: RE | Admit: 2016-01-30 | Discharge: 2016-01-30 | Disposition: A | Discharge status: Home / Self Care | Payer: PPO | Source: Ambulatory Visit | Attending: Internal Medicine | Admitting: Internal Medicine

## 2016-01-30 VITALS — Wt 170.0 lb

## 2016-01-30 DIAGNOSIS — J449 Chronic obstructive pulmonary disease, unspecified: Secondary | ICD-10-CM | POA: Diagnosis not present

## 2016-01-30 NOTE — Progress Notes (Signed)
Daily Session Note  Patient Details  Name: Phillip Jordan MRN: [id number] Date of Birth: 1929/10/02 Referring Provider:        Pulmonary Rehab Walk Test from 01/07/2016 in [place]   Referring Provider  Dr. [name]      Encounter Date: 01/30/2016  Check In:     Session Check In - 01/30/16 1348    Check-In   [facility]   Staff Present Su Hilt, MS, ACSM RCEP, Exercise Physiologist;Sheamus Hasting Leonia Reeves, RN, BSN;Lisa Ysidro Evert, RN;Portia Rollene Rotunda, RN, BSN   Supervising physician immediately available to respond to emergencies [facility] immediately available   Physician(s) Dr. Marily Memos   Medication changes reported     Yes   Fall or balance concerns reported    No   Warm-up and Cool-down Performed as group-led instruction   Resistance Training Performed Yes   VAD Patient? No   Pain Assessment   Currently in Pain? No/denies   Multiple Pain Sites No      Capillary Blood Glucose: No results found for this or any previous visit (from the past 24 hour(s)).     POCT Glucose - 01/30/16 1611    POCT Blood Glucose   Pre-Exercise 131 mg/dL   Post-Exercise 104 mg/dL         Exercise Prescription Changes - 01/30/16 1600    Response to Exercise   Blood Pressure (Admit) 116/60 mmHg   Blood Pressure (Exercise) 154/60 mmHg   Blood Pressure (Exit) 124/66 mmHg   Heart Rate (Admit) 65 bpm   Heart Rate (Exercise) 94 bpm   Heart Rate (Exit) 70 bpm   Oxygen Saturation (Admit) 96 %   Oxygen Saturation (Exercise) 93 %   Oxygen Saturation (Exit) 95 %   Rating of Perceived Exertion (Exercise) 9   Perceived Dyspnea (Exercise) 0   Duration Progress to 45 minutes of aerobic exercise without signs/symptoms of physical distress   Intensity THRR unchanged   Progression   Progression Continue to progress workloads to maintain intensity without signs/symptoms of physical distress.   Resistance Training   Training Prescription Yes   Weight blue bands   Reps 10-12   Interval Training   Interval Training No   Treadmill   MPH 2.4   Grade 3   Minutes 15   NuStep   Level 5   Minutes 15   METs 3.5     Goals Met:  Exercise tolerated well Strength training completed today  Goals Unmet:  Not Applicable  Comments: Service time is from 1330 to 1500    Dr. Rush Farmer is Medical Director for Pulmonary Rehab at [facility].

## 2016-01-30 NOTE — Progress Notes (Signed)
Daily Session Note  Patient Details  Name: Phillip Jordan MRN: [id number] Date of Birth: [birth year] Referring Provider:        Pulmonary Rehab Walk Test from 01/07/2016 in [place]   Referring Provider  Dr. [name]      Encounter Date: 01/30/2016  Check In:     Session Check In - 01/30/16 1348    Check-In   [facility]   Staff Present Su Hilt, MS, ACSM RCEP, Exercise Physiologist;Shelden Raborn [place] Reeves, RN, BSN;Lisa Ysidro Evert, RN;Portia Rollene Rotunda, RN, BSN   Supervising physician immediately available to respond to emergencies [facility] immediately available   Physician(s) Dr. Marily Memos   Medication changes reported     Yes   Fall or balance concerns reported    No   Warm-up and Cool-down Performed as group-led instruction   Resistance Training Performed Yes   VAD Patient? No   Pain Assessment   Currently in Pain? No/denies   Multiple Pain Sites No      Capillary Blood Glucose: No results found for this or any previous visit (from the past 24 hour(s)).     POCT Glucose - 01/30/16 1611    POCT Blood Glucose   Pre-Exercise 131 mg/dL   Post-Exercise 104 mg/dL         Exercise Prescription Changes - 01/30/16 1600    Response to Exercise   Blood Pressure (Admit) 116/60 mmHg   Blood Pressure (Exercise) 154/60 mmHg   Blood Pressure (Exit) 124/66 mmHg   Heart Rate (Admit) 65 bpm   Heart Rate (Exercise) 94 bpm   Heart Rate (Exit) 70 bpm   Oxygen Saturation (Admit) 96 %   Oxygen Saturation (Exercise) 93 %   Oxygen Saturation (Exit) 95 %   Rating of Perceived Exertion (Exercise) 9   Perceived Dyspnea (Exercise) 0   Duration Progress to 45 minutes of aerobic exercise without signs/symptoms of physical distress   Intensity THRR unchanged   Progression   Progression Continue to progress workloads to maintain intensity without signs/symptoms of physical distress.   Resistance Training   Training Prescription Yes   Weight blue bands   Reps 10-12   Interval Training   Interval Training No   Treadmill   MPH 2.4   Grade 3   Minutes 15   NuStep   Level 5   Minutes 15   METs 3.5     Goals Met:  Independence with exercise equipment Exercise tolerated well Strength training completed today  Goals Unmet:  Not Applicable  Comments: Service time is from 1330 to 1500    Dr. Rush Farmer is Medical Director for Pulmonary Rehab at [facility].

## 2016-02-04 ENCOUNTER — Telehealth (HOSPITAL_COMMUNITY): Payer: Self-pay | Admitting: *Deleted

## 2016-02-04 ENCOUNTER — Telehealth: Payer: Self-pay

## 2016-02-04 ENCOUNTER — Encounter (HOSPITAL_COMMUNITY)
Admission: RE | Admit: 2016-02-04 | Discharge: 2016-02-04 | Disposition: A | Discharge status: Home / Self Care | Payer: PPO | Source: Ambulatory Visit | Attending: Internal Medicine | Admitting: Internal Medicine

## 2016-02-04 DIAGNOSIS — J449 Chronic obstructive pulmonary disease, unspecified: Secondary | ICD-10-CM | POA: Diagnosis not present

## 2016-02-04 NOTE — Progress Notes (Signed)
Daily Session Note  Patient Details  Name: Phillip Jordan MRN: [id number] Date of Birth: [birth year] Referring Provider:        Pulmonary Rehab Walk Test from 01/07/2016 in [place]   Referring Provider  Dr. [name]      Encounter Date: 02/04/2016  Check In:     Session Check In - 02/04/16 1329    Check-In   [facility]   Staff Present Su Hilt, MS, ACSM RCEP, Exercise Physiologist;Joan [place] Reeves, RN, BSN;Eliezer Khawaja, RN;Portia Rollene Rotunda, Therapist, sports, BSN;Ramon Dredge, RN, [facility]   Supervising physician immediately available to respond to emergencies [facility] immediately available   Physician(s) Dr. Marily Memos   Medication changes reported     No   Fall or balance concerns reported    No   Warm-up and Cool-down Performed as group-led instruction   Resistance Training Performed Yes   VAD Patient? No   Pain Assessment   Currently in Pain? No/denies   Multiple Pain Sites No      Capillary Blood Glucose: No results found for this or any previous visit (from the past 24 hour(s)).     POCT Glucose - 02/04/16 1614    POCT Blood Glucose   Pre-Exercise 137 mg/dL   Post-Exercise 112 mg/dL         Exercise Prescription Changes - 02/04/16 1600    Response to Exercise   Blood Pressure (Admit) 144/72 mmHg   Blood Pressure (Exercise) 180/70 mmHg   Blood Pressure (Exit) 120/60 mmHg   Heart Rate (Admit) 73 bpm   Heart Rate (Exercise) 97 bpm   Heart Rate (Exit) 80 bpm   Oxygen Saturation (Admit) 96 %   Oxygen Saturation (Exercise) 90 %   Oxygen Saturation (Exit) 94 %   Rating of Perceived Exertion (Exercise) 14   Perceived Dyspnea (Exercise) 3   Duration Progress to 45 minutes of aerobic exercise without signs/symptoms of physical distress   Intensity THRR unchanged   Resistance Training   Training Prescription Yes   Weight blue bands   Reps 10-12   Interval Training   Interval Training No   Treadmill   MPH 2.4    Grade 3   Minutes 15   NuStep   Level 5   Minutes 15   METs 3   Rower   Level 1   Minutes 15     Goals Met:  Exercise tolerated well No report of cardiac concerns or symptoms Strength training completed today  Goals Unmet:  Not Applicable  Comments: Service time is from 1330 to 1500    Dr. Rush Farmer is Medical Director for Pulmonary Rehab at [facility].

## 2016-02-04 NOTE — Telephone Encounter (Signed)
Received call from Pulmonary Rehab who states when patient comes for his rehab appointrments his BP fluctuates.  States when he comes in his BP may run 154/80 and as he does his Pulmonary exercises his BP may rise to 180/90. States this has been happening for about a month Nurse states they feel patient needs evaluation. Called patient, scheduled appointment for BP evaluation  . Appointment scheduled. Patient advised to go to ED or UC if he finds his BP rising and he notices chest pain and SOB. Patient agreed. Advised patient to record daily BP readings and bring logs in with him on appointment date 02/10/16. Patient agreed.

## 2016-02-04 NOTE — Progress Notes (Signed)
Pulmonary Individual Treatment Plan  Patient Details  Name: Phillip Jordan MRN: [id number] Date of Birth: [birth year] Referring Provider:        Pulmonary Rehab Walk Test from 01/07/2016 in [facility]   Referring Provider  Dr. [name]      Initial Encounter Date:       Pulmonary Rehab Walk Test from 01/07/2016 in [place]   Date  01/07/16   Referring Provider  Dr. [name]      Visit Diagnosis: Chronic obstructive pulmonary disease, unspecified COPD type ([place])  Patient's Home Medications on Admission:   Current outpatient prescriptions:  .  acetaminophen (TYLENOL) 500 MG tablet, Take 1,000 mg by mouth every 6 (six) hours as needed for pain. Reported on 08/30/2015, Disp: , Rfl:  .  albuterol (PROAIR HFA) 108 (90 Base) MCG/ACT inhaler, 2 puffs every 4 hours as needed only  if your can't catch your breath, Disp: 1 Inhaler, Rfl: 11 .  albuterol (PROVENTIL HFA;VENTOLIN HFA) 108 (90 BASE) MCG/ACT inhaler, Inhale 2 puffs into the lungs every 6 (six) hours as needed for wheezing or shortness of breath., Disp: 1 Inhaler, Rfl: 0 .  aspirin 81 MG tablet, Take 1 tablet (81 mg total) by mouth daily., Disp: , Rfl:  .  clopidogrel (PLAVIX) 75 MG tablet, Take 1 tablet (75 mg total) by mouth daily., Disp: 90 tablet, Rfl: 2 .  famotidine (PEPCID) 20 MG tablet, One at bedtime, Disp: 30 tablet, Rfl: 11 .  glucose blood (FREESTYLE TEST STRIPS) test strip, Use as instructed, Disp: 100 each, Rfl: 12 .  isosorbide mononitrate (IMDUR) 30 MG 24 hr tablet, Take 1 tablet (30 mg total) by mouth 2 (two) times daily., Disp: 180 tablet, Rfl: 2 .  loratadine (CLARITIN) 10 MG tablet, Take 10 mg by mouth daily as needed for allergies. , Disp: , Rfl:  .  metFORMIN (GLUCOPHAGE) 500 MG tablet, TAKE ONE TABLET BY MOUTH TWICE DAILY WITH A MEAL, Disp: 180 tablet, Rfl: 0 .  Misc. Devices (ACAPELLA) MISC, Use as directed, Disp: 1 each, Rfl: 0 .  mometasone-formoterol  (DULERA) 200-5 MCG/ACT AERO, Inhale 2 puffs into the lungs 2 (two) times daily., Disp: 1 Inhaler, Rfl: 0 .  montelukast (SINGULAIR) 10 MG tablet, Take 1 tablet (10 mg total) by mouth at bedtime., Disp: 90 tablet, Rfl: 2 .  Multiple Vitamin (MULTIVITAMIN WITH MINERALS) TABS tablet, Take 1 tablet by mouth daily., Disp: , Rfl:  .  nitroGLYCERIN (NITROSTAT) 0.4 MG SL tablet, Place 1 tablet (0.4 mg total) under the tongue every 5 (five) minutes as needed for chest pain (up to 3 doses only.)., Disp: 25 tablet, Rfl: 3 .  pantoprazole (PROTONIX) 40 MG tablet, TAKE ONE TABLET BY MOUTH ONCE DAILY **TAKE  30-60  MINUTES  BEFORE  THE  FIRST  MEAL  OF  THE  DAY**, Disp: 30 tablet, Rfl: 11 .  pravastatin (PRAVACHOL) 40 MG tablet, Take 1 tablet (40 mg total) by mouth daily., Disp: 90 tablet, Rfl: 2  Past Medical History: Past Medical History  Diagnosis Date  . COPD with asthma ([place])   . CAD (coronary artery disease)     a. s/p MI 1992. b. Prior hx stenting to RCA/Cx. b. 05/2013: [place] s/p DES to distal RCA then staged DES to prox LAD.   . Diabetes mellitus   . Hyperlipidemia   . Allergic rhinitis   . Aortic dissection ([place])     f/u by cards - AAA  with distal dissection.  . Shingles   . Lichen planus     Right midline inferior chest, Dr. [name] Sensing    Tobacco Use: History  Smoking status  . Former Smoker -- 1.50 packs/day for 39 years  . Types: Cigarettes  . Quit date: 08/24/1992  Smokeless tobacco  . Never Used    Labs:     Recent Review Flowsheet Data    Labs for ITP Cardiac and Pulmonary Rehab Latest Ref Rng 09/18/2013 01/17/2014 06/19/2014 01/02/2015 09/10/2015   Cholestrol 0 - 200 mg/dL 164 - 129 - 170   LDLCALC 0 - 99 mg/dL 95 - 68 - 93   HDL >39.00 mg/dL 39.50 - 31.70(L) - 39.80   Trlycerides 0.0 - 149.0 mg/dL 149.0 - 148.0 - 183.0(H)   Hemoglobin A1c 4.6 - 6.5 % 6.7(H) 6.4 6.5 6.4 6.6(H)      Capillary Blood Glucose: Lab Results  Component Value Date   GLUCAP 110* 06/09/2013    GLUCAP 113* 06/09/2013       POCT Glucose      01/14/16 1546 01/21/16 1521 01/23/16 1621 01/28/16 1546 01/30/16 1611   POCT Blood Glucose   Pre-Exercise 118 mg/dL 117 mg/dL 135 mg/dL 110 mg/dL 131 mg/dL   Post-Exercise 104 mg/dL 116 mg/dL 96 mg/dL  Pt given banana  95 mg/dL  given banana to eat before discharge 104 mg/dL     02/04/16 1614           POCT Blood Glucose   Pre-Exercise 137 mg/dL       Post-Exercise 112 mg/dL          ADL UCSD:     Pulmonary Assessment Scores      01/07/16 1657       ADL UCSD   ADL Phase Entry     SOB Score total 30     CAT Score   CAT Score 21        Pulmonary Function Assessment:     Pulmonary Function Assessment - 01/03/16 1132    Breath   Bilateral Breath Sounds Wheezes  left upper and lower lobe   Shortness of Breath Limiting activity;Yes      Exercise Target Goals:    Exercise Program Goal: Individual exercise prescription set with THRR, safety & activity barriers. Participant demonstrates ability to understand and report RPE using BORG scale, to self-measure pulse accurately, and to acknowledge the importance of the exercise prescription.  Exercise Prescription Goal: Starting with aerobic activity 30 plus minutes a day, 3 days per week for initial exercise prescription. Provide home exercise prescription and guidelines that participant acknowledges understanding prior to discharge.  Activity Barriers & Risk Stratification:     Activity Barriers & Cardiac Risk Stratification - 01/03/16 1131    Activity Barriers & Cardiac Risk Stratification   Activity Barriers Shortness of Breath;Arthritis      6 Minute Walk:     6 Minute Walk      01/07/16 1559       6 Minute Walk   Phase Initial     Distance 1610 feet     Walk Time 6 minutes     # of Rest Breaks 0     MPH 3.04     METS 3.53     RPE 14     Perceived Dyspnea  3     Symptoms No     Resting HR 70 bpm     Resting BP 152/88 mmHg     Max Ex.  HR 109 bpm      Max Ex. BP 197/74 mmHg     2 Minute Post BP 178/81 mmHg     Interval HR   Baseline HR 70     1 Minute HR 89     2 Minute HR 90     3 Minute HR 92     4 Minute HR 100     5 Minute HR 102     6 Minute HR 109     2 Minute Post HR 84     Interval Oxygen   Baseline Oxygen Saturation % 97 %     Baseline Liters of Oxygen 0 L     1 Minute Oxygen Saturation % 95 %     1 Minute Liters of Oxygen 0 L     2 Minute Oxygen Saturation % 93 %     2 Minute Liters of Oxygen 0 L     3 Minute Oxygen Saturation % 90 %     3 Minute Liters of Oxygen 0 L     4 Minute Oxygen Saturation % 89 %     4 Minute Liters of Oxygen 0 L     5 Minute Oxygen Saturation % 89 %     5 Minute Liters of Oxygen 0 L     6 Minute Oxygen Saturation % 87 %     6 Minute Liters of Oxygen 0 L     2 Minute Post Oxygen Saturation % 96 %     2 Minute Post Liters of Oxygen 0 L        Initial Exercise Prescription:     Initial Exercise Prescription - 01/07/16 1600    Date of Initial Exercise RX and Referring Provider   Date 01/07/16   Referring Provider Dr. [name]   Oxygen   Oxygen --  room air   Treadmill   MPH 2   Grade 1   Minutes 15   NuStep   Level 3   Minutes 15   METs 1.8   Rower   Level 1   Minutes 15   Prescription Details   Frequency (times per week) 2   Duration Progress to 45 minutes of aerobic exercise without signs/symptoms of physical distress   Intensity   THRR 40-80% of Max Heartrate 55-110   Ratings of Perceived Exertion 11-13   Perceived Dyspnea 0-4   Progression   Progression Continue to progress workloads to maintain intensity without signs/symptoms of physical distress.   Resistance Training   Training Prescription Yes   Weight blue bands   Reps 10-12      Perform Capillary Blood Glucose checks as needed.  Exercise Prescription Changes:      Exercise Prescription Changes      01/14/16 1500 01/16/16 1500 01/21/16 1500 01/23/16 1600 01/28/16 1500   Exercise Review    Progression  Yes  Yes Yes   Response to Exercise   Blood Pressure (Admit) 140/64 mmHg 154/74 mmHg 144/68 mmHg 128/70 mmHg 148/70 mmHg   Blood Pressure (Exercise) 180/80 mmHg 132/64 mmHg 144/66 mmHg 124/60 mmHg 190/84 mmHg  recheck 162/70. BP elevated today on equipment, will follow.   Blood Pressure (Exit) 124/85 mmHg 112/64 mmHg 122/66 mmHg 126/64 mmHg 124/62 mmHg   Heart Rate (Admit) 66 bpm 73 bpm 73 bpm 63 bpm 70 bpm   Heart Rate (Exercise) 98 bpm 93 bpm 95 bpm 77 bpm 101 bpm   Heart Rate (Exit) 78 bpm 73 bpm 70  bpm 72 bpm 75 bpm   Oxygen Saturation (Admit) 98 % 93 % 94 % 96 % 98 %   Oxygen Saturation (Exercise) 90 % 90 % 91 % 91 % 90 %   Oxygen Saturation (Exit) 94 % 97 % 93 % 95 % 94 %   Rating of Perceived Exertion (Exercise) _0 Perceived Dyspnea (Exercise) _1 0 0   Symptoms hypertension       Duration Progress to 45 minutes of aerobic exercise without signs/symptoms of physical distress Progress to 45 minutes of aerobic exercise without signs/symptoms of physical distress Progress to 45 minutes of aerobic exercise without signs/symptoms of physical distress Progress to 45 minutes of aerobic exercise without signs/symptoms of physical distress Progress to 45 minutes of aerobic exercise without signs/symptoms of physical distress   Intensity Other (comment)  40-80% HRR THRR unchanged THRR unchanged THRR unchanged THRR unchanged   Progression   Progression     Continue to progress workloads to maintain intensity without signs/symptoms of physical distress.   Resistance Training   Training Prescription _2    Weight _3    Reps 10-12 10-12 10-12 10-12 10-12   Interval Training   Interval Training  No No No No   Oxygen   Oxygen --  room air       Treadmill   MPH 2 2.2 2.2 2.2 2.4   Grade _4 Minutes _5 NuStep   Level _6 Minutes _7 METs 2.3  2.8 2.5 3.2    Rower   Level _8 Minutes _9 01/30/16 1600 02/04/16 1500 02/04/16 1600       Response to Exercise   Blood Pressure (Admit) 116/60 mmHg  144/72 mmHg     Blood Pressure (Exercise) 154/60 mmHg  180/70 mmHg     Blood Pressure (Exit) 124/66 mmHg  120/60 mmHg     Heart Rate (Admit) 65 bpm  73 bpm     Heart Rate (Exercise) 94 bpm  97 bpm     Heart Rate (Exit) 70 bpm  80 bpm     Oxygen Saturation (Admit) 96 %  96 %     Oxygen Saturation (Exercise) 93 %  90 %     Oxygen Saturation (Exit) 95 %  94 %     Rating of Perceived Exertion (Exercise) 9  14     Perceived Dyspnea (Exercise) 0  3     Comments  Reviewed home exercise with patient      Duration Progress to 45 minutes of aerobic exercise without signs/symptoms of physical distress  Progress to 45 minutes of aerobic exercise without signs/symptoms of physical distress     Intensity THRR unchanged  THRR unchanged     Progression   Progression Continue to progress workloads to maintain intensity without signs/symptoms of physical distress.       Resistance Training   Training Prescription Yes  Yes     Weight blue bands  blue bands     Reps 10-12  10-12     Interval Training   Interval Training No  No     Treadmill   MPH 2.4  2.4     Grade 3  3  Minutes 15  15     NuStep   Level 5  5     Minutes 15  15     METs 3.5  3     Rower   Level   1     Minutes   15        Exercise Comments:      Exercise Comments      02/04/16 1512 02/06/16 0857         Exercise Comments Reviewed home exercise with patient Patient has increased MET level from 2.3 to 3.4. Monitoring BP closely. Will cont. to monitor.          Discharge Exercise Prescription (Final Exercise Prescription Changes):     Exercise Prescription Changes - 02/04/16 1600    Response to Exercise   Blood Pressure (Admit) 144/72 mmHg   Blood Pressure (Exercise) 180/70 mmHg   Blood Pressure (Exit) 120/60 mmHg   Heart Rate (Admit) 73 bpm   Heart  Rate (Exercise) 97 bpm   Heart Rate (Exit) 80 bpm   Oxygen Saturation (Admit) 96 %   Oxygen Saturation (Exercise) 90 %   Oxygen Saturation (Exit) 94 %   Rating of Perceived Exertion (Exercise) 14   Perceived Dyspnea (Exercise) 3   Duration Progress to 45 minutes of aerobic exercise without signs/symptoms of physical distress   Intensity THRR unchanged   Resistance Training   Training Prescription Yes   Weight blue bands   Reps 10-12   Interval Training   Interval Training No   Treadmill   MPH 2.4   Grade 3   Minutes 15   NuStep   Level 5   Minutes 15   METs 3   Rower   Level 1   Minutes 15       Nutrition:  Target Goals: Understanding of nutrition guidelines, daily intake of sodium <1535m, cholesterol <2048m calories 30% from fat and 7% or less from saturated fats, daily to have 5 or more servings of fruits and vegetables.  Biometrics:    Nutrition Therapy Plan and Nutrition Goals:   Nutrition Discharge: Rate Your Plate Scores:   Psychosocial: Target Goals: Acknowledge presence or absence of depression, maximize coping skills, provide positive support system. Participant is able to verbalize types and ability to use techniques and skills needed for reducing stress and depression.  Initial Review & Psychosocial Screening:     Initial Psych Review & Screening - 01/03/16 1147    Family Dynamics   Good Support System? Yes   Barriers   Psychosocial barriers to participate in program There are no identifiable barriers or psychosocial needs.   Screening Interventions   Interventions Encouraged to exercise      Quality of Life Scores:     Quality of Life - 01/07/16 1656    Quality of Life Scores   Health/Function Pre 25.87 %   Socioeconomic Pre 26.22 %   Psych/Spiritual Pre 23.14 %   Family Pre 26.25 %   GLOBAL Pre 25.46 %      PHQ-9:     Recent Review Flowsheet Data    [facility] 01/03/2016 12/12/2015 05/02/2015 01/02/2015 09/18/2013    Decreased Interest 0 0 0 0 0   Down, Depressed, Hopeless 0 0 0 0 0   PHQ - 2 Score 0 0 0 0 0      Psychosocial Evaluation and Intervention:   Psychosocial Re-Evaluation:     Psychosocial Re-Evaluation      02/04/16 1659  Psychosocial Re-Evaluation   Interventions Encouraged to attend Pulmonary Rehabilitation for the exercise       Comments no psychosocial issues identified       Continued Psychosocial Services Needed No         Education: Education Goals: Education classes will be provided on a weekly basis, covering required topics. Participant will state understanding/return demonstration of topics presented.  Learning Barriers/Preferences:     Learning Barriers/Preferences - 01/03/16 1132    Learning Barriers/Preferences   Learning Barriers None   Learning Preferences Group Instruction;Individual Instruction;Skilled Demonstration;Verbal Instruction;Video;Written Material      Education Topics: Risk Factor Reduction:  -Group instruction that is supported by a PowerPoint presentation. Instructor discusses the definition of a risk factor, different risk factors for pulmonary disease, and how the heart and lungs work together.     Nutrition for Pulmonary Patient:  -Group instruction provided by PowerPoint slides, verbal discussion, and written materials to support subject matter. The instructor gives an explanation and review of healthy diet recommendations, which includes a discussion on weight management, recommendations for fruit and vegetable consumption, as well as protein, fluid, caffeine, fiber, sodium, sugar, and alcohol. Tips for eating when patients are short of breath are discussed.      PULMONARY REHAB CHRONIC OBSTRUCTIVE PULMONARY DISEASE from 01/30/2016 in [place]   Date  01/23/16   Educator  RD   Instruction Review Code  2- meets goals/outcomes      Pursed Lip Breathing:  -Group instruction that is supported  by demonstration and informational handouts. Instructor discusses the benefits of pursed lip and diaphragmatic breathing and detailed demonstration on how to preform both.     Oxygen Safety:  -Group instruction provided by PowerPoint, verbal discussion, and written material to support subject matter. There is an overview of "What is Oxygen" and "Why do we need it".  Instructor also reviews how to create a safe environment for oxygen use, the importance of using oxygen as prescribed, and the risks of noncompliance. There is a brief discussion on traveling with oxygen and resources the patient may utilize.   Oxygen Equipment:  -Group instruction provided by [facility] Staff utilizing handouts, written materials, and equipment demonstrations.   Signs and Symptoms:  -Group instruction provided by written material and verbal discussion to support subject matter. Warning signs and symptoms of infection, stroke, and heart attack are reviewed and when to call the physician/911 reinforced. Tips for preventing the spread of infection discussed.   Advanced Directives:  -Group instruction provided by verbal instruction and written material to support subject matter. Instructor reviews Advanced Directive laws and proper instruction for filling out document.   Pulmonary Video:  -Group video education that reviews the importance of medication and oxygen compliance, exercise, good nutrition, pulmonary hygiene, and pursed lip and diaphragmatic breathing for the pulmonary patient.          PULMONARY REHAB CHRONIC OBSTRUCTIVE PULMONARY DISEASE from 01/30/2016 in [place]   Date  01/30/16   Instruction Review Code  2- meets goals/outcomes      Exercise for the Pulmonary Patient:  -Group instruction that is supported by a PowerPoint presentation. Instructor discusses benefits of exercise, core components of exercise, frequency, duration, and intensity of an exercise routine,  importance of utilizing pulse oximetry during exercise, safety while exercising, and options of places to exercise outside of rehab.     Pulmonary Medications:  -Verbally interactive group education provided by instructor with focus  on inhaled medications and proper administration.   Anatomy and Physiology of the Respiratory System and Intimacy:  -Group instruction provided by PowerPoint, verbal discussion, and written material to support subject matter. Instructor reviews respiratory cycle and anatomical components of the respiratory system and their functions. Instructor also reviews differences in obstructive and restrictive respiratory diseases with examples of each. Intimacy, Sex, and Sexuality differences are reviewed with a discussion on how relationships can change when diagnosed with pulmonary disease. Common sexual concerns are reviewed.   Knowledge Questionnaire Score:     Knowledge Questionnaire Score - 01/07/16 1656    Knowledge Questionnaire Score   Pre Score 11/13      Core Components/Risk Factors/Patient Goals at Admission:     Personal Goals and Risk Factors at Admission - 01/03/16 1140    Core Components/Risk Factors/Patient Goals on Admission   Increase Strength and Stamina Yes   Intervention Provide advice, education, support and counseling about physical activity/exercise needs.;Develop an individualized exercise prescription for aerobic and resistive training based on initial evaluation findings, risk stratification, comorbidities and participant's personal goals.   Expected Outcomes Achievement of increased cardiorespiratory fitness and enhanced flexibility, muscular endurance and strength shown through measurements of functional capacity and personal statement of participant.   Develop more efficient breathing techniques such as purse lipped breathing and diaphragmatic breathing; and practicing self-pacing with activity Yes   Intervention Provide education,  demonstration and support about specific breathing techniuqes utilized for more efficient breathing. Include techniques such as pursed lipped breathing, diaphragmatic breathing and self-pacing activity.   Expected Outcomes Short Term: Participant will be able to demonstrate and use breathing techniques as needed throughout daily activities.   Increase knowledge of respiratory medications and ability to use respiratory devices properly  Yes   Intervention Provide education and demonstration as needed of appropriate use of medications, inhalers, and oxygen therapy.   Expected Outcomes Short Term: Achieves understanding of medications use. Understands that oxygen is a medication prescribed by physician. Demonstrates appropriate use of inhaler and oxygen therapy.   Personal Goal Other Yes   Personal Goal have more energy to do the things I enjoy, gardening, singing in the choir and remain active    Intervention increase workloads on the exercise equiptment as tolerated inorder to increase statmin and strength and to decrease shortness of breath with exertion   Expected Outcomes patient state he has more energy      Core Components/Risk Factors/Patient Goals Review:      Goals and Risk Factor Review      02/04/16 1657           Core Components/Risk Factors/Patient Goals Review   Personal Goals Review Increase Strength and Stamina;Improve shortness of breath with ADL's;Develop more efficient breathing techniques such as purse lipped breathing and diaphragmatic breathing and practicing self-pacing with activity.       Review strength and stamina are improving, addressing hypertension with Dr. [name], [place] has an appointment to evaluate BP elevation       Expected Outcomes Lower BP and improved strength and stamina          Core Components/Risk Factors/Patient Goals at Discharge (Final Review):      Goals and Risk Factor Review - 02/04/16 1657    Core Components/Risk Factors/Patient Goals  Review   Personal Goals Review Increase Strength and Stamina;Improve shortness of breath with ADL's;Develop more efficient breathing techniques such as purse lipped breathing and diaphragmatic breathing and practicing self-pacing with activity.   Review strength and stamina are  improving, addressing hypertension with Dr. [name], [place] has an appointment to evaluate BP elevation   Expected Outcomes Lower BP and improved strength and stamina      ITP Comments:   Comments: ITP REVIEW Pt is making expected progress toward personal goals after completing 7 sessions.   Recommend continued exercise, life style modification, education, and utilization of breathing techniques to increase stamina and strength and decrease shortness of breath with exertion.

## 2016-02-06 ENCOUNTER — Encounter (HOSPITAL_COMMUNITY)
Admission: RE | Admit: 2016-02-06 | Discharge: 2016-02-06 | Disposition: A | Discharge status: Home / Self Care | Payer: PPO | Source: Ambulatory Visit | Attending: Internal Medicine | Admitting: Internal Medicine

## 2016-02-06 VITALS — Wt 170.2 lb

## 2016-02-06 DIAGNOSIS — J449 Chronic obstructive pulmonary disease, unspecified: Secondary | ICD-10-CM

## 2016-02-06 NOTE — Progress Notes (Signed)
Daily Session Note  Patient Details  Name: Phillip Jordan MRN: [id number] Date of Birth: 17-Nov-1929 Referring Provider:        Pulmonary Rehab Walk Test from 01/07/2016 in [place]   Referring Provider  Dr. [name]      Encounter Date: 02/06/2016  Check In:     Session Check In - 02/06/16 1343    Check-In   Location [facility]   Staff Present Su Hilt, MS, ACSM RCEP, Exercise Physiologist;Joan Leonia Reeves, RN, Luisa Hart, RN, BSN   Supervising physician immediately available to respond to emergencies [facility] immediately available   Physician(s) Dr. Waldron Labs   Medication changes reported     No   Fall or balance concerns reported    No   Warm-up and Cool-down Performed as group-led instruction   Resistance Training Performed Yes   VAD Patient? No   Pain Assessment   Currently in Pain? No/denies   Multiple Pain Sites No      Capillary Blood Glucose: No results found for this or any previous visit (from the past 24 hour(s)).      Exercise Prescription Changes - 02/06/16 1705    Response to Exercise   Blood Pressure (Admit) 122/70 mmHg   Blood Pressure (Exit) 136/72 mmHg   Heart Rate (Admit) 66 bpm   Heart Rate (Exercise) 105 bpm   Heart Rate (Exit) 72 bpm   Oxygen Saturation (Admit) 95 %   Oxygen Saturation (Exercise) 91 %   Oxygen Saturation (Exit) 93 %   Rating of Perceived Exertion (Exercise) 13   Perceived Dyspnea (Exercise) 0   Duration Progress to 45 minutes of aerobic exercise without signs/symptoms of physical distress   Intensity THRR unchanged   Resistance Training   Training Prescription Yes   Weight blue bands   Reps 10-12   Interval Training   Interval Training No   Treadmill   MPH 2.4   Grade 3   Minutes 15   Rower   Level 1   Minutes 15     Goals Met:  Improved SOB with ADL's Using PLB without cueing & demonstrates good technique Exercise tolerated well No report of  cardiac concerns or symptoms Strength training completed today  Goals Unmet:  Not Applicable  Comments: Service time is from 1330 to 1550   Dr. Rush Farmer is Medical Director for Pulmonary Rehab at [facility].

## 2016-02-10 ENCOUNTER — Encounter: Payer: Self-pay | Admitting: Internal Medicine

## 2016-02-10 ENCOUNTER — Ambulatory Visit (INDEPENDENT_AMBULATORY_CARE_PROVIDER_SITE_OTHER): Payer: PPO | Admitting: Internal Medicine

## 2016-02-10 VITALS — BP 126/70 | HR 65 | Temp 98.0°F | Ht 69.0 in | Wt 171.1 lb

## 2016-02-10 DIAGNOSIS — J439 Emphysema, unspecified: Secondary | ICD-10-CM

## 2016-02-10 DIAGNOSIS — R5382 Chronic fatigue, unspecified: Secondary | ICD-10-CM | POA: Diagnosis not present

## 2016-02-10 DIAGNOSIS — E118 Type 2 diabetes mellitus with unspecified complications: Secondary | ICD-10-CM

## 2016-02-10 DIAGNOSIS — I10 Essential (primary) hypertension: Secondary | ICD-10-CM

## 2016-02-10 LAB — BASIC METABOLIC PANEL
BUN: 14 mg/dL (ref 6–23)
CHLORIDE: 102 meq/L (ref 96–112)
CO2: 26 meq/L (ref 19–32)
CREATININE: 1.06 mg/dL (ref 0.40–1.50)
Calcium: 9.3 mg/dL (ref 8.4–10.5)
GFR: 70.78 mL/min (ref >60.00)
GLUCOSE: 102 mg/dL — AB (ref 70–99)
Potassium: 4.3 mEq/L (ref 3.5–5.1)
Sodium: 138 mEq/L (ref 135–145)

## 2016-02-10 LAB — HEMOGLOBIN A1C: Hgb A1c MFr Bld: 6.2 % (ref 4.6–6.5)

## 2016-02-10 MED ORDER — NITROGLYCERIN 0.4 MG SL SUBL: 0.4000 mg | SUBLINGUAL_TABLET | SUBLINGUAL | Status: DC | PRN

## 2016-02-10 MED ORDER — GLUCOSE BLOOD VI STRP: ORAL_STRIP | Status: DC

## 2016-02-10 NOTE — Assessment & Plan Note (Signed)
Fatigue: See previous note, slightly better with pulmonary rehabilitation? No further eval Diabetes: Doing well with diet, activite d/t pulm rehab. Check a A1c and BMP. COPD: doing pulmonary rehabilitation, notes reviewed, he's not desaturating with exertion, BPs satisfactory. I don't see that we could get a overnight pulse oximetry. No need for oxygen at this point. RTC 5 months

## 2016-02-10 NOTE — Progress Notes (Signed)
Subjective:    Patient ID: Phillip Jordan, male    DOB: Jan 14, 1930, 82 y.o.   MRN: [id number]  DOS:  02/10/2016 Type of visit - description : Follow-up Interval history: COPD, fatigue-- Since the last office visit, he started to do pulmonary rehabilitation, fatigue has decreased a little?[name] CAD--Good medication compliance, needs a refill on nitroglycerin. DM Good compliance of medications, due for A1c  Review of Systems No chest pain, no lower extremity edema No nausea, vomiting, diarrhea  Past Medical History  Diagnosis Date  . COPD with asthma ([place])   . CAD (coronary artery disease)     a. s/p MI 1992. b. Prior hx stenting to RCA/Cx. b. 05/2013: [place] s/p DES to distal RCA then staged DES to prox LAD.   . Diabetes mellitus   . Hyperlipidemia   . Allergic rhinitis   . Aortic dissection ([place])     f/u by cards - AAA with distal dissection.  . Shingles   . Lichen planus     Right midline inferior chest, Dr. [name] Sensing    Past Surgical History  Procedure Laterality Date  . Back surgery  1993  . Cardiac catheterization      left, w/ coronary angiography and left ventriculograpy  . Ptca      w/ placement of drugeluting stent in the distal right coronary artery, and distal Cardiologist: [name]  . Left heart catheterization with coronary angiogram N/A 06/09/2013    Procedure: LEFT HEART CATHETERIZATION WITH CORONARY ANGIOGRAM;  Surgeon: [name], MD;  Location: [facility] CATH LAB;  Service: Cardiovascular;  Laterality: N/A;  . Percutaneous coronary stent intervention (pci-s) N/A 06/12/2013    Procedure: PERCUTANEOUS CORONARY STENT INTERVENTION (PCI-S);  Surgeon: [name], MD;  Location: [facility] CATH LAB;  Service: Cardiovascular;  Laterality: N/A;  prox LAD    Social History   Social History  . Marital Status: Married    Spouse Name: N/A  . Number of Children: 3  . Years of Education: N/A   Occupational History  . retired    Social History Main  Topics  . Smoking status: Former Smoker -- 1.50 packs/day for 39 years    Types: Cigarettes    Quit date: 08/24/1992  . Smokeless tobacco: Never Used  . Alcohol Use: No  . Drug Use: No  . Sexual Activity: Not on file   Other Topics Concern  . Not on file   Social History Narrative   Lives w/ wife, does do some  local mission work  .   3 kids, 11 Gk-GGK                     Medication List       This list is accurate as of: 02/10/16  3:16 PM.  Always use your most recent med list.               ACAPELLA Misc  Use as directed     acetaminophen 500 MG tablet  Commonly known as:  TYLENOL  Take 1,000 mg by mouth every 6 (six) hours as needed for pain. Reported on 08/30/2015     albuterol 108 (90 Base) MCG/ACT inhaler  Commonly known as:  PROVENTIL HFA;VENTOLIN HFA  Inhale 2 puffs into the lungs every 6 (six) hours as needed for wheezing or shortness of breath.     albuterol 108 (90 Base) MCG/ACT inhaler  Commonly known as:  PROAIR HFA  2 puffs every 4 hours as  needed only  if your can't catch your breath     aspirin 81 MG tablet  Take 1 tablet (81 mg total) by mouth daily.     clopidogrel 75 MG tablet  Commonly known as:  PLAVIX  Take 1 tablet (75 mg total) by mouth daily.     famotidine 20 MG tablet  Commonly known as:  PEPCID  One at bedtime     glucose blood test strip  Commonly known as:  FREESTYLE TEST STRIPS  Check blood sugar no more than twice daily     isosorbide mononitrate 30 MG 24 hr tablet  Commonly known as:  IMDUR  Take 1 tablet (30 mg total) by mouth 2 (two) times daily.     loratadine 10 MG tablet  Commonly known as:  CLARITIN  Take 10 mg by mouth daily as needed for allergies.     metFORMIN 500 MG tablet  Commonly known as:  GLUCOPHAGE  TAKE ONE TABLET BY MOUTH TWICE DAILY WITH A MEAL     mometasone-formoterol 200-5 MCG/ACT Aero  Commonly known as:  DULERA  Inhale 2 puffs into the lungs 2 (two) times daily.     montelukast 10 MG  tablet  Commonly known as:  SINGULAIR  Take 1 tablet (10 mg total) by mouth at bedtime.     multivitamin with minerals Tabs tablet  Take 1 tablet by mouth daily.     nitroGLYCERIN 0.4 MG SL tablet  Commonly known as:  NITROSTAT  Place 1 tablet (0.4 mg total) under the tongue every 5 (five) minutes as needed for chest pain (up to 3 doses only.).     pantoprazole 40 MG tablet  Commonly known as:  PROTONIX  TAKE ONE TABLET BY MOUTH ONCE DAILY **TAKE  30-60  MINUTES  BEFORE  THE  FIRST  MEAL  OF  THE  DAY**     pravastatin 40 MG tablet  Commonly known as:  PRAVACHOL  Take 1 tablet (40 mg total) by mouth daily.           Objective:   Physical Exam BP 126/70 mmHg  Pulse 65  Temp(Src) 98 F (36.7 C) (Oral)  Ht 5\' 9"  (1.753 m)  Wt 171 lb 2 oz (77.622 kg)  BMI 25.26 kg/m2  SpO2 95% General:   Well developed, well nourished . NAD.  HEENT:  Normocephalic . Face symmetric, atraumatic Lungs:  Decreased breath sounds , normal respiratory effort, no intercostal retractions, no accessory muscle use. Heart: RRR,  no murmur.  No pretibial edema bilaterally  Skin: Not pale. Not jaundice Neurologic:  alert & oriented X3.  Speech normal, gait appropriate for age and unassisted Psych--  Cognition and judgment appear intact.  Cooperative with normal attention span and concentration.  Behavior appropriate. No anxious or depressed appearing.      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 Ronnald Ramp Chronic hoarseness, status post ENT eval ~ 2012 Normal DEXA 2012  PLAN Fatigue: See previous note, slightly better with pulmonary rehabilitation? No further eval Diabetes: Doing well with diet, activite d/t pulm rehab. Check a A1c and BMP. COPD: doing pulmonary  rehabilitation, notes reviewed, he's not desaturating with exertion, BPs satisfactory. I don't see that we could get a overnight pulse oximetry. No need for oxygen at this point.  RTC 5 months

## 2016-02-10 NOTE — Patient Instructions (Addendum)
GO TO THE FRONT DESK Schedule your next appointment for a  routine checkup in 5 months  Ideal blood pressures are between 110/65 and  145/85. If it is consistently higher or lower, let me know

## 2016-02-10 NOTE — Progress Notes (Signed)
Pre visit review using our clinic review tool, if applicable. No additional management support is needed unless otherwise documented below in the visit note. 

## 2016-02-11 ENCOUNTER — Encounter (HOSPITAL_COMMUNITY)
Admission: RE | Admit: 2016-02-11 | Discharge: 2016-02-11 | Disposition: A | Discharge status: Home / Self Care | Payer: PPO | Source: Ambulatory Visit | Attending: Internal Medicine | Admitting: Internal Medicine

## 2016-02-11 VITALS — Wt 171.5 lb

## 2016-02-11 DIAGNOSIS — J449 Chronic obstructive pulmonary disease, unspecified: Secondary | ICD-10-CM | POA: Diagnosis not present

## 2016-02-11 NOTE — Progress Notes (Signed)
Daily Session Note  Patient Details  Name: Phillip Jordan MRN: [id number] Date of Birth: Feb 28, 1930 Referring Provider:        Pulmonary Rehab Walk Test from 01/07/2016 in [place]   Referring Provider  Dr. [name]      Encounter Date: 02/11/2016  Check In:     Session Check In - 02/11/16 1343    Check-In   Location [facility]   Staff Present Su Hilt, MS, ACSM RCEP, Exercise Physiologist;Aadin Gaut Leonia Reeves, RN, BSN   Supervising physician immediately available to respond to emergencies [facility] immediately available   Physician(s) Dr. Marily Memos   Medication changes reported     Yes   Fall or balance concerns reported    No   Warm-up and Cool-down Performed as group-led instruction   Resistance Training Performed Yes   VAD Patient? No   Pain Assessment   Currently in Pain? No/denies   Multiple Pain Sites No      Capillary Blood Glucose: No results found for this or any previous visit (from the past 24 hour(s)).     POCT Glucose - 02/11/16 1621    POCT Blood Glucose   Pre-Exercise 124 mg/dL   Post-Exercise 104 mg/dL         Exercise Prescription Changes - 02/11/16 1600    Exercise Review   Progression Yes   Response to Exercise   Blood Pressure (Admit) 150/70 mmHg   Blood Pressure (Exercise) 170/70 mmHg   Blood Pressure (Exit) 144/70 mmHg   Heart Rate (Admit) 65 bpm   Heart Rate (Exercise) 100 bpm   Heart Rate (Exit) 74 bpm   Oxygen Saturation (Admit) 96 %   Oxygen Saturation (Exercise) 88 %   Oxygen Saturation (Exit) 95 %   Rating of Perceived Exertion (Exercise) 13   Perceived Dyspnea (Exercise) 1   Duration Progress to 45 minutes of aerobic exercise without signs/symptoms of physical distress   Intensity THRR unchanged   Progression   Progression Continue to progress workloads to maintain intensity without signs/symptoms of physical distress.   Resistance Training   Training Prescription Yes   Weight blue bands   Reps 10-12   Interval Training   Interval Training No   Treadmill   MPH 2.2   Grade 1   Minutes 15   NuStep   Level 5   Minutes 15   METs 3.5   Rower   Level 2   Minutes 15     Goals Met:  Exercise tolerated well Strength training completed today  Goals Unmet:  Not Applicable Comments: Service time is from 1330 to 1505    Dr. Rush Farmer is Medical Director for Pulmonary Rehab at [facility].

## 2016-02-13 ENCOUNTER — Encounter (HOSPITAL_COMMUNITY)
Admission: RE | Admit: 2016-02-13 | Discharge: 2016-02-13 | Disposition: A | Discharge status: Home / Self Care | Payer: PPO | Source: Ambulatory Visit | Attending: Internal Medicine | Admitting: Internal Medicine

## 2016-02-13 VITALS — Wt 172.2 lb

## 2016-02-13 DIAGNOSIS — J449 Chronic obstructive pulmonary disease, unspecified: Secondary | ICD-10-CM

## 2016-02-13 NOTE — Progress Notes (Signed)
Daily Session Note  Patient Details  Name: Phillip Jordan MRN: [id number] Date of Birth: 04/03/1930 Referring Provider:        Pulmonary Rehab Walk Test from 01/07/2016 in [place]   Referring Provider  Dr. [name]      Encounter Date: 02/13/2016  Check In:     Session Check In - 02/13/16 1620    Check-In   [facility]   Staff Present Rosebud Poles, RN, BSN;Lisa Ysidro Evert, RN;Molly diVincenzo, MS, ACSM RCEP, Exercise Physiologist   Supervising physician immediately available to respond to emergencies [facility] immediately available   Physician(s) Dr. Marily Memos   Medication changes reported     No   Fall or balance concerns reported    No   Warm-up and Cool-down Performed as group-led instruction   Resistance Training Performed Yes   VAD Patient? No   Pain Assessment   Currently in Pain? No/denies   Multiple Pain Sites No      Capillary Blood Glucose: No results found for this or any previous visit (from the past 24 hour(s)).      Exercise Prescription Changes - 02/13/16 1600    Exercise Review   Progression Yes   Response to Exercise   Blood Pressure (Admit) 122/70 mmHg   Blood Pressure (Exercise) 160/60 mmHg   Blood Pressure (Exit) 136/76 mmHg   Heart Rate (Admit) 65 bpm   Heart Rate (Exercise) 88 bpm   Heart Rate (Exit) 76 bpm   Oxygen Saturation (Admit) 96 %   Oxygen Saturation (Exercise) 89 %   Oxygen Saturation (Exit) 95 %   Rating of Perceived Exertion (Exercise) 13   Perceived Dyspnea (Exercise) 1   Duration Progress to 45 minutes of aerobic exercise without signs/symptoms of physical distress   Intensity THRR unchanged   Progression   Progression Continue to progress workloads to maintain intensity without signs/symptoms of physical distress.   Resistance Training   Training Prescription Yes   Weight blue bands   Reps 10-12   Interval Training   Interval Training No   Treadmill   MPH 2.4    Grade 3   Minutes 15   NuStep   Level 6   Minutes 15   METs 3.4   Rower   Level 2   Minutes 15     Goals Met:  [place] with exercise equipment Using PLB without cueing & demonstrates good technique Exercise tolerated well Strength training completed today  Goals Unmet:  Not Applicable  Comments: Service time is from 1330 to 1500    Dr. Rush Farmer is Medical Director for Pulmonary Rehab at [facility]

## 2016-02-18 ENCOUNTER — Encounter (HOSPITAL_COMMUNITY)
Admission: RE | Admit: 2016-02-18 | Discharge: 2016-02-18 | Disposition: A | Discharge status: Home / Self Care | Payer: PPO | Source: Ambulatory Visit | Attending: Internal Medicine | Admitting: Internal Medicine

## 2016-02-18 VITALS — Wt 171.1 lb

## 2016-02-18 DIAGNOSIS — J449 Chronic obstructive pulmonary disease, unspecified: Secondary | ICD-10-CM | POA: Diagnosis not present

## 2016-02-18 NOTE — Progress Notes (Signed)
Daily Session Note  Patient Details  Name: Phillip Jordan MRN: [id number] Date of Birth: 12/27/1929 Referring Provider:        Pulmonary Rehab Walk Test from 01/07/2016 in [place]   Referring Provider  Dr. [name]      Encounter Date: 02/18/2016  Check In:     Session Check In - 02/18/16 1338    Check-In   [facility]   Staff Present Su Hilt, MS, ACSM RCEP, Exercise Physiologist;Portia Rollene Rotunda, RN, BSN;Lisa Ysidro Evert, RN;Perri Aragones Leonia Reeves, RN, BSN   Supervising physician immediately available to respond to emergencies [facility] immediately available   Physician(s) Dr. Marily Memos   Medication changes reported     No   Fall or balance concerns reported    No   Warm-up and Cool-down Performed as group-led instruction   Resistance Training Performed Yes   VAD Patient? No   Pain Assessment   Currently in Pain? No/denies   Multiple Pain Sites No      Capillary Blood Glucose: No results found for this or any previous visit (from the past 24 hour(s)).     POCT Glucose - 02/18/16 1540    POCT Blood Glucose   Pre-Exercise 127 mg/dL   Post-Exercise 124 mg/dL         Exercise Prescription Changes - 02/18/16 1500    Response to Exercise   Blood Pressure (Admit) 130/60 mmHg   Blood Pressure (Exercise) 154/72 mmHg   Blood Pressure (Exit) 118/64 mmHg   Heart Rate (Admit) 69 bpm   Heart Rate (Exercise) 100 bpm   Heart Rate (Exit) 78 bpm   Oxygen Saturation (Admit) 95 %   Oxygen Saturation (Exercise) 91 %   Oxygen Saturation (Exit) 95 %   Rating of Perceived Exertion (Exercise) 15   Perceived Dyspnea (Exercise) 1   Duration Progress to 45 minutes of aerobic exercise without signs/symptoms of physical distress   Intensity THRR unchanged   Progression   Progression Continue to progress workloads to maintain intensity without signs/symptoms of physical distress.   Resistance Training   Training Prescription Yes   Weight blue bands   Reps 10-12   Interval Training   Interval Training No   Treadmill   MPH 2.4   Grade 3   Minutes 17   NuStep   Level 6   Minutes 17   METs 3.2   Rower   Level 2   Minutes 17     Goals Met:  Improved SOB with ADL's Using PLB without cueing & demonstrates good technique Exercise tolerated well Strength training completed today  Goals Unmet:  Not Applicable  Comments: Service time is from 1330 to 1505    Dr. Rush Farmer is Medical Director for Pulmonary Rehab at [facility].

## 2016-02-19 ENCOUNTER — Ambulatory Visit: Payer: PPO | Admitting: Internal Medicine

## 2016-02-20 ENCOUNTER — Encounter (HOSPITAL_COMMUNITY)
Admission: RE | Admit: 2016-02-20 | Discharge: 2016-02-20 | Disposition: A | Discharge status: Home / Self Care | Payer: PPO | Source: Ambulatory Visit | Attending: Internal Medicine | Admitting: Internal Medicine

## 2016-02-20 ENCOUNTER — Encounter (HOSPITAL_COMMUNITY): Payer: Self-pay

## 2016-02-20 DIAGNOSIS — J449 Chronic obstructive pulmonary disease, unspecified: Secondary | ICD-10-CM | POA: Diagnosis not present

## 2016-02-20 NOTE — Progress Notes (Signed)
Archie Balboa 82 y.o. male Nutrition Note Spoke with pt. Pt is overweight. Per nutrition screen, pt reports wanting to lose wt. Per discussion, pt is not actively trying to lose wt.  Wt loss and the aging process discussed. Pt desires to maintain his wt while in Pulmonary Rehab at this time. Pt is making many healthy food choices. Age-appropriate nutrition recommendations discussed. Pt's Rate Your Plate results reviewed with pt. Pt eats mostly fresh food; eats out 1-2 times/week and uses canned food infrequently. Pt occasionally adds salt to food. The role of sodium in lung disease reviewed with pt. Pt is diabetic. Pt now checks CBG's 1 time a day " an hour or two after I eat." CBG today reportedly 146 mg/dL after lunch.  Pt's last A1c indicates blood glucose well-controlled. Per discussion with Rosebud Poles, RN, pt has had to be encouraged to eat more before exercise due to CBG's dropping with exercise. Blood sugar and meal/snack timing with exercise discussed with pt.  Pt expressed understanding of the information reviewed via feedback method.    Lab Results  Component Value Date   HGBA1C 6.2 02/10/2016    Nutrition Diagnosis ? Food-and nutrition-related knowledge deficit related to lack of exposure to information as related to diagnosis of pulmonary disease ?  Nutrition Intervention ? Pt's individual nutrition plan and goals reviewed with pt. ? Benefits of adopting healthy eating habits discussed when pt's Rate Your Plate reviewed. ? ? If pt may benefit from decreasing Metformin to 500 mg q evening in the near future ? Pt to attend the Nutrition and Lung Disease class ? Continual client-centered nutrition education by RD, as part of interdisciplinary care. Goal(s) 1. Describe the benefit of including fruits, vegetables, whole grains, and low-fat dairy products in a healthy meal plan. 2. CBG's in the normal range or as close to normal as is safely possible. Monitor and Evaluate progress  toward nutrition goal with team.   Derek Mound, M.Ed, RD, LDN, CDE 02/20/2016 3:13 PM

## 2016-02-20 NOTE — Progress Notes (Signed)
Daily Session Note  Patient Details  Name: Phillip Jordan MRN: [id number] Date of Birth: 27-Sep-1929 Referring Provider:        Pulmonary Rehab Walk Test from 01/07/2016 in [place]   Referring Provider  Dr. [name]      Encounter Date: 02/20/2016  Check In:     Session Check In - 02/20/16 1707    Check-In   [facility]   Staff Present Rosebud Poles, RN, BSN;Lisa Ysidro Evert, RN;Molly diVincenzo, MS, ACSM RCEP, Exercise Physiologist   Supervising physician immediately available to respond to emergencies [facility] immediately available   Physician(s) Dr. Waldron Labs   Medication changes reported     No   Fall or balance concerns reported    No   Warm-up and Cool-down Performed as group-led instruction   Resistance Training Performed Yes   VAD Patient? No   Pain Assessment   Currently in Pain? No/denies   Multiple Pain Sites No      Capillary Blood Glucose: No results found for this or any previous visit (from the past 24 hour(s)).     POCT Glucose - 02/20/16 1712    POCT Blood Glucose   Pre-Exercise 146 mg/dL   Post-Exercise 102 mg/dL         Exercise Prescription Changes - 02/20/16 1700    Response to Exercise   Blood Pressure (Admit) 144/60 mmHg   Blood Pressure (Exercise) 120/68 mmHg   Blood Pressure (Exit) 122/60 mmHg   Heart Rate (Admit) 62 bpm   Heart Rate (Exercise) 102 bpm   Heart Rate (Exit) 74 bpm   Oxygen Saturation (Admit) 96 %   Oxygen Saturation (Exercise) 87 %   Oxygen Saturation (Exit) 94 %   Rating of Perceived Exertion (Exercise) 11   Perceived Dyspnea (Exercise) 1   Duration Progress to 45 minutes of aerobic exercise without signs/symptoms of physical distress   Intensity THRR unchanged   Progression   Progression Continue to progress workloads to maintain intensity without signs/symptoms of physical distress.   Resistance Training   Training Prescription Yes   Weight blue bands   Reps 10-12   Interval Training   Interval Training No   Treadmill   MPH 2.4   Grade 3   Minutes 17   Rower   Level 2   Minutes 17     Goals Met:  Improved SOB with ADL's Using PLB without cueing & demonstrates good technique Exercise tolerated well Strength training completed today  Goals Unmet:  Not Applicable  Comments: Service time is from 1330 to 1600    Dr. Rush Farmer is Medical Director for Pulmonary Rehab at [facility].

## 2016-02-25 ENCOUNTER — Encounter (HOSPITAL_COMMUNITY): Payer: PPO

## 2016-02-25 ENCOUNTER — Ambulatory Visit: Payer: PPO

## 2016-02-26 ENCOUNTER — Ambulatory Visit: Payer: PPO

## 2016-02-27 ENCOUNTER — Encounter (HOSPITAL_COMMUNITY)
Admission: RE | Admit: 2016-02-27 | Discharge: 2016-02-27 | Disposition: A | Discharge status: Home / Self Care | Payer: PPO | Source: Ambulatory Visit | Attending: Internal Medicine | Admitting: Internal Medicine

## 2016-02-27 VITALS — Wt 169.8 lb

## 2016-02-27 DIAGNOSIS — I252 Old myocardial infarction: Secondary | ICD-10-CM | POA: Insufficient documentation

## 2016-02-27 DIAGNOSIS — J449 Chronic obstructive pulmonary disease, unspecified: Secondary | ICD-10-CM | POA: Insufficient documentation

## 2016-02-27 DIAGNOSIS — I2510 Atherosclerotic heart disease of native coronary artery without angina pectoris: Secondary | ICD-10-CM | POA: Insufficient documentation

## 2016-02-27 DIAGNOSIS — Z79899 Other long term (current) drug therapy: Secondary | ICD-10-CM | POA: Diagnosis not present

## 2016-02-27 DIAGNOSIS — Z955 Presence of coronary angioplasty implant and graft: Secondary | ICD-10-CM | POA: Diagnosis not present

## 2016-02-27 NOTE — Progress Notes (Signed)
Daily Session Note  Patient Details  Name: Phillip Jordan MRN: [id number] Date of Birth: 02/08/1930 Referring Provider:        Pulmonary Rehab Walk Test from 01/07/2016 in [place]   Referring Provider  Dr. [name]      Encounter Date: 02/27/2016  Check In:     Session Check In - 02/27/16 1407    Check-In   Location [facility]   Staff Present Rosebud Poles, RN, BSN;Gwyneth Fernandez Ysidro Evert, Felipe Drone, RN, MHA;Molly diVincenzo, MS, ACSM RCEP, Exercise Physiologist   Supervising physician immediately available to respond to emergencies [facility] immediately available   Physician(s) Dr. Cruzita Lederer   Medication changes reported     No   Fall or balance concerns reported    No   Warm-up and Cool-down Performed as group-led instruction   Resistance Training Performed Yes   VAD Patient? No   Pain Assessment   Currently in Pain? No/denies   Multiple Pain Sites No      Capillary Blood Glucose: No results found for this or any previous visit (from the past 24 hour(s)).     POCT Glucose - 02/27/16 1550    POCT Blood Glucose   Pre-Exercise 186 mg/dL   Pre-Exercise #2 98 mg/dL         Exercise Prescription Changes - 02/27/16 1500    Response to Exercise   Blood Pressure (Admit) 144/66 mmHg   Blood Pressure (Exercise) 124/64 mmHg   Blood Pressure (Exit) 112/60 mmHg   Heart Rate (Admit) 63 bpm   Heart Rate (Exercise) 100 bpm   Heart Rate (Exit) 72 bpm   Oxygen Saturation (Admit) 94 %   Oxygen Saturation (Exercise) 87 %   Oxygen Saturation (Exit) 94 %   Rating of Perceived Exertion (Exercise) 13   Perceived Dyspnea (Exercise) 0   Duration Progress to 45 minutes of aerobic exercise without signs/symptoms of physical distress   Intensity THRR unchanged   Progression   Progression Continue to progress workloads to maintain intensity without signs/symptoms of physical distress.   Resistance Training   Training Prescription  Yes   Weight blue bands   Reps 10-12   Interval Training   Interval Training No   Treadmill   MPH 2.4   Grade 3   Minutes 17   Rower   Level 2   Minutes 17     Goals Met:  Exercise tolerated well No report of cardiac concerns or symptoms Strength training completed today  Goals Unmet:  Not Applicable  Comments: Service time is from 1330 to 1530    Dr. Rush Farmer is Medical Director for Pulmonary Rehab at [facility].

## 2016-03-03 ENCOUNTER — Encounter (HOSPITAL_COMMUNITY)
Admission: RE | Admit: 2016-03-03 | Discharge: 2016-03-03 | Disposition: A | Discharge status: Home / Self Care | Payer: PPO | Source: Ambulatory Visit | Attending: Internal Medicine | Admitting: Internal Medicine

## 2016-03-03 VITALS — Wt 171.5 lb

## 2016-03-03 DIAGNOSIS — J449 Chronic obstructive pulmonary disease, unspecified: Secondary | ICD-10-CM

## 2016-03-03 NOTE — Progress Notes (Signed)
Daily Session Note  Patient Details  Name: Phillip Jordan MRN: [id number] Date of Birth: 08/12/1930 Referring Provider:        Pulmonary Rehab Walk Test from 01/07/2016 in [place]   Referring Provider  Dr. [name]      Encounter Date: 03/03/2016  Check In:     Session Check In - 03/03/16 1341    Check-In   Location [facility]   Staff Present Rosebud Poles, RN, BSN;Liyla Radliff Ysidro Evert, RN;Portia Rollene Rotunda, RN, BSN;Ramon Dredge, RN, [facility]   Supervising physician immediately available to respond to emergencies [facility] immediately available   Physician(s) Dr. Marily Memos   Medication changes reported     No   Fall or balance concerns reported    No   Warm-up and Cool-down Performed as group-led instruction   Resistance Training Performed Yes   VAD Patient? No   Pain Assessment   Currently in Pain? No/denies   Multiple Pain Sites No      Capillary Blood Glucose: No results found for this or any previous visit (from the past 24 hour(s)).      Exercise Prescription Changes - 03/03/16 1500    Response to Exercise   Blood Pressure (Admit) 120/60 mmHg   Blood Pressure (Exercise) 160/70 mmHg   Blood Pressure (Exit) 130/60 mmHg   Heart Rate (Admit) 67 bpm   Heart Rate (Exercise) 102 bpm   Heart Rate (Exit) 79 bpm   Oxygen Saturation (Admit) 96 %   Oxygen Saturation (Exercise) 89 %   Oxygen Saturation (Exit) 94 %   Rating of Perceived Exertion (Exercise) 13   Perceived Dyspnea (Exercise) 1   Duration Progress to 45 minutes of aerobic exercise without signs/symptoms of physical distress   Intensity THRR unchanged   Progression   Progression Continue to progress workloads to maintain intensity without signs/symptoms of physical distress.   Resistance Training   Training Prescription Yes   Weight blue bands   Reps 10-12   Interval Training   Interval Training No   Treadmill   MPH 2.4   Grade 3   Minutes 34   NuStep   Level 6   Minutes 17   METs 3.6   Rower   Level --  pain in left shoulder with trying to use put pt on treadmill     Goals Met:  Exercise tolerated well No report of cardiac concerns or symptoms Strength training completed today  Goals Unmet:  Not Applicable  Comments: Service time is from 1330 to 1515    Dr. Rush Farmer is Medical Director for Pulmonary Rehab at [facility].

## 2016-03-04 ENCOUNTER — Ambulatory Visit (INDEPENDENT_AMBULATORY_CARE_PROVIDER_SITE_OTHER): Payer: PPO

## 2016-03-04 VITALS — BP 146/72 | HR 67 | Resp 16 | Ht 69.0 in | Wt 171.2 lb

## 2016-03-04 DIAGNOSIS — E11319 Type 2 diabetes mellitus with unspecified diabetic retinopathy without macular edema: Secondary | ICD-10-CM | POA: Diagnosis not present

## 2016-03-04 DIAGNOSIS — J449 Chronic obstructive pulmonary disease, unspecified: Secondary | ICD-10-CM | POA: Diagnosis not present

## 2016-03-04 DIAGNOSIS — Z0000 Encounter for general adult medical examination without abnormal findings: Secondary | ICD-10-CM | POA: Diagnosis not present

## 2016-03-04 NOTE — Progress Notes (Addendum)
Subjective:   Phillip Jordan is a 82 y.o. male who presents for Medicare Annual/Subsequent preventive examination.  Review of Systems:  No ROS.  Medicare Wellness Visit.   Cardiac Risk Factors include: advanced age (>25men, >50 women);diabetes mellitus;dyslipidemia;male gender  Sleep patterns: 8 hours nightly, no sleep issues. Gets up maybe 1-2x nightly for bathroom.    Home Safety/Smoke Alarms:  Lives in 1 story townhouse w/ wife, no stairs. Feels safe in home. Has smoke detectors. No firearms.  Seat Belt Safety/Bike Helmet: Wears seat belt.   Counseling:   Eye Exam-Dr. Prudencio Burly, once yearly; next appt in October. Dental-Dr. Cari Caraway every 6 months  Male:  CCS-last 12/04/05, diverticulosis, otherwise normal; no further screening indicated given age    32 04/24/10-0.73        Objective:    Vitals: BP 146/72 mmHg  Pulse 67  Resp 16  Ht 5\' 9"  (1.753 m)  Wt 171 lb 3.2 oz (77.656 kg)  BMI 25.27 kg/m2  SpO2 98%  Body mass index is 25.27 kg/(m^2).  Tobacco History  Smoking status  . Former Smoker -- 1.50 packs/day for 39 years  . Types: Cigarettes  . Quit date: 08/24/1992  Smokeless tobacco  . Never Used     Counseling given: Not Answered   Past Medical History  Diagnosis Date  . COPD with asthma ([place])   . CAD (coronary artery disease)     a. s/p MI 1992. b. Prior hx stenting to RCA/Cx. b. 05/2013: [place] s/p DES to distal RCA then staged DES to prox LAD.   . Diabetes mellitus   . Hyperlipidemia   . Allergic rhinitis   . Aortic dissection ([place])     f/u by cards - AAA with distal dissection.  . Shingles   . Lichen planus     Right midline inferior chest, Dr. [name] Sensing   Past Surgical History  Procedure Laterality Date  . Back surgery  1993  . Cardiac catheterization      left, w/ coronary angiography and left ventriculograpy  . Ptca      w/ placement of drugeluting stent in the distal right coronary artery, and distal Cardiologist: [name]  . Left  heart catheterization with coronary angiogram N/A 06/09/2013    Procedure: LEFT HEART CATHETERIZATION WITH CORONARY ANGIOGRAM;  Surgeon: [name], MD;  Location: [facility] CATH LAB;  Service: Cardiovascular;  Laterality: N/A;  . Percutaneous coronary stent intervention (pci-s) N/A 06/12/2013    Procedure: PERCUTANEOUS CORONARY STENT INTERVENTION (PCI-S);  Surgeon: [name], MD;  Location: [facility] CATH LAB;  Service: Cardiovascular;  Laterality: N/A;  prox LAD   Family History  Problem Relation Age of Onset  . Prostate cancer Neg Hx   . Colon cancer Neg Hx   . Asthma Mother   . Heart disease Mother     CHF  . Cancer Mother     melanoma  . Heart disease Father     MI   History  Sexual Activity  . Sexual Activity: Not on file    Outpatient Encounter Prescriptions as of 03/04/2016  Medication Sig  . acetaminophen (TYLENOL) 500 MG tablet Take 1,000 mg by mouth every 6 (six) hours as needed for pain. Reported on 08/30/2015  . albuterol (PROAIR HFA) 108 (90 Base) MCG/ACT inhaler 2 puffs every 4 hours as needed only  if your can't catch your breath  . aspirin 81 MG tablet Take 1 tablet (81 mg total) by mouth daily.  . clopidogrel (PLAVIX)  75 MG tablet Take 1 tablet (75 mg total) by mouth daily.  . famotidine (PEPCID) 20 MG tablet One at bedtime  . glucose blood (FREESTYLE TEST STRIPS) test strip Check blood sugar no more than twice daily  . isosorbide mononitrate (IMDUR) 30 MG 24 hr tablet Take 1 tablet (30 mg total) by mouth 2 (two) times daily.  [name] loratadine (CLARITIN) 10 MG tablet Take 10 mg by mouth daily as needed for allergies.   . metFORMIN (GLUCOPHAGE) 500 MG tablet TAKE ONE TABLET BY MOUTH TWICE DAILY WITH A MEAL  . Misc. Devices (ACAPELLA) MISC Use as directed  . mometasone-formoterol (DULERA) 200-5 MCG/ACT AERO Inhale 2 puffs into the lungs 2 (two) times daily.  . montelukast (SINGULAIR) 10 MG tablet Take 1 tablet (10 mg total) by mouth at bedtime.  . Multiple Vitamin  (MULTIVITAMIN WITH MINERALS) TABS tablet Take 1 tablet by mouth daily.  . nitroGLYCERIN (NITROSTAT) 0.4 MG SL tablet Place 1 tablet (0.4 mg total) under the tongue every 5 (five) minutes as needed for chest pain (up to 3 doses only.).  [name] pantoprazole (PROTONIX) 40 MG tablet TAKE ONE TABLET BY MOUTH ONCE DAILY **TAKE  30-60  MINUTES  BEFORE  THE  FIRST  MEAL  OF  THE  DAY**  . pravastatin (PRAVACHOL) 40 MG tablet Take 1 tablet (40 mg total) by mouth daily.  . [DISCONTINUED] albuterol (PROVENTIL HFA;VENTOLIN HFA) 108 (90 BASE) MCG/ACT inhaler Inhale 2 puffs into the lungs every 6 (six) hours as needed for wheezing or shortness of breath. (Patient not taking: Reported on 03/04/2016)   No facility-administered encounter medications on file as of 03/04/2016.    Activities of Daily Living In your present state of health, do you have any difficulty performing the following activities: 03/04/2016 12/12/2015  Hearing? Tempie Donning  Vision? N N  Difficulty concentrating or making decisions? N N  Walking or climbing stairs? N Y  Dressing or bathing? N N  Doing errands, shopping? N N  Preparing Food and eating ? N -  Using the Toilet? N -  In the past six months, have you accidently leaked urine? N -  Do you have problems with loss of bowel control? N -  Managing your Medications? N -  Managing your Finances? N -  Housekeeping or managing your Housekeeping? N -    Patient Care Team: Colon Branch, MD as PCP - General [name] Sensing, MD as Consulting Physician (Dermatology) [name], MD as Consulting Physician (Ophthalmology) [name], MD as Consulting Physician (Pulmonary Disease) [name], MD as Consulting Physician (Cardiology)   Assessment:   Physical assessment deferred to PCP.  Exercise Activities and Dietary recommendations Current Exercise Habits: Home exercise routine;Structured exercise class, Type of exercise: stretching;walking;treadmill, Time (Minutes): 45, Frequency (Times/Week):  5, Weekly Exercise (Minutes/Week): 225, Intensity: Moderate (Pulmonary rehab 2 days weekley, [employer] aerobics classes 2-3 days weekly.), Exercise limited by: respiratory conditions(s) (COPD)   Diet: Drinks plenty of water, 1 cup of coffee daily. Tries to eat several small meals/snacks throughout the day instead of eating 3 large meals. Breakfast: Bowel of cereal and fruit, maybe eggs and bacon w/ whole wheat toast; may eat more if going to exercise Lunch: Snacks-chips, beans, salsa Dinner: Snacks  Goals    . Patient Stated     No declines in health. Continue to serve God and country.      Fall Risk Fall Risk  03/04/2016 01/03/2016 12/12/2015 05/02/2015 01/02/2015  Falls in the past  year? No No No No No  Risk for fall due to : History of fall(s) - - - -   Depression Screen PHQ 2/9 Scores 03/04/2016 01/03/2016 12/12/2015 05/02/2015  PHQ - 2 Score 0 0 0 0  Exception Documentation - - Patient refusal -    Cognitive Testing MMSE - Mini Mental State Exam 03/04/2016  Orientation to time 5  Orientation to Place 5  Registration 3  Attention/ Calculation 5  Recall 3  Language- name 2 objects 2  Language- repeat 1  Language- follow 3 step command 3  Language- read & follow direction 1  Write a sentence 1  Copy design 1  Total score 30    Immunization History  Administered Date(s) Administered  . Influenza Split 05/26/2011, 06/01/2012  . Influenza Whole 06/30/2007, 06/18/2008, 06/14/2009, 04/24/2010  . Influenza, High Dose Seasonal PF 06/19/2014, 05/02/2015  . Influenza,inj,Quad PF,36+ Mos 06/10/2013  . Pneumococcal Conjugate-13 09/10/2015  . Pneumococcal Polysaccharide-23 08/24/2001, 06/30/2007, 06/01/2012  . Td 08/24/2005  . Zoster 10/13/2007   Screening Tests Health Maintenance  Topic Date Due  . TETANUS/TDAP  09/09/2016 (Originally 08/25/2015)  . INFLUENZA VACCINE  03/24/2016  . FOOT EXAM  05/01/2016  . URINE MICROALBUMIN  05/01/2016  . OPHTHALMOLOGY EXAM  06/04/2016  .  HEMOGLOBIN A1C  08/11/2016  . ZOSTAVAX  Completed  . PNA vac Low Risk Adult  Completed      Plan:    Continue to eat heart healthy diet (full of fruits, vegetables, whole grains, lean protein, water--limit salt, fat, and sugar intake) and increase physical activity as tolerated. Continue doing brain stimulating activities (puzzles, reading, adult coloring books, staying active) to keep memory sharp.  Continue checking daily fasting blood sugar. Please bring your blood sugar log to your next appointment. Contact insurance company to see if they will cover Td or Tdap (Tetanus) vaccine.  Follow-up with Dr. [name] as scheduled.   During the course of the visit the patient was educated and counseled about the following appropriate screening and preventive services:   Vaccines to include Pneumoccal, Influenza, Hepatitis B, Td, Zostavax, HCV  Electrocardiogram  Cardiovascular Disease  Colorectal cancer screening  Diabetes screening  Prostate Cancer Screening  Glaucoma screening  Nutrition counseling   Smoking cessation counseling  Patient Instructions (the written plan) was given to the patient.    Dorrene German, RN  03/04/2016   Agree  French Ana MD

## 2016-03-04 NOTE — Assessment & Plan Note (Signed)
Pt reports compliance w/ medications. States recently he feels that CBG occasionally gets too low; lowest home reading is 85. Reports he feels weak w/ CBG at this level. Pt also has CBG checked at pulmonary rehab and states they will not let him leave if CBG < 100. Normal fasting/non-fasting CBG ranges discussed w/ pt. Pt would like to discuss cutting back on metformin, appt scheduled w/ PCP.

## 2016-03-04 NOTE — Patient Instructions (Signed)
Continue to eat heart healthy diet (full of fruits, vegetables, whole grains, lean protein, water--limit salt, fat, and sugar intake) and increase physical activity as tolerated.  Continue doing brain stimulating activities (puzzles, reading, adult coloring books, staying active) to keep memory sharp.   Continue checking daily fasting blood sugar. Please bring your blood sugar log to your next appointment.  Contact insurance company to see if they will cover Td or Tdap (Tetanus) vaccine.   Follow-up with Dr. [name] as scheduled.

## 2016-03-04 NOTE — Assessment & Plan Note (Addendum)
Followed by Dr. [name]. Currently in pulmonary rehab. Always carries rescue inhaler, using it 1-2x weekly or less.

## 2016-03-05 ENCOUNTER — Encounter (HOSPITAL_COMMUNITY)
Admission: RE | Admit: 2016-03-05 | Discharge: 2016-03-05 | Disposition: A | Discharge status: Home / Self Care | Payer: PPO | Source: Ambulatory Visit | Attending: Internal Medicine | Admitting: Internal Medicine

## 2016-03-05 VITALS — Wt 172.0 lb

## 2016-03-05 DIAGNOSIS — J449 Chronic obstructive pulmonary disease, unspecified: Secondary | ICD-10-CM

## 2016-03-05 NOTE — Progress Notes (Signed)
Pulmonary Individual Treatment Plan  Patient Details  Name: Phillip Jordan MRN: [id number] Date of Birth: 06/16/1930 Referring Provider:        Pulmonary Rehab Walk Test from 01/07/2016 in [place]   Referring Provider  Dr. [name]      Initial Encounter Date:       Pulmonary Rehab Walk Test from 01/07/2016 in [place]   Date  01/07/16   Referring Provider  Dr. [name]      Visit Diagnosis: No diagnosis found.  Patient's Home Medications on Admission:   Current outpatient prescriptions:  .  acetaminophen (TYLENOL) 500 MG tablet, Take 1,000 mg by mouth every 6 (six) hours as needed for pain. Reported on 08/30/2015, Disp: , Rfl:  .  albuterol (PROAIR HFA) 108 (90 Base) MCG/ACT inhaler, 2 puffs every 4 hours as needed only  if your can't catch your breath, Disp: 1 Inhaler, Rfl: 11 .  aspirin 81 MG tablet, Take 1 tablet (81 mg total) by mouth daily., Disp: , Rfl:  .  clopidogrel (PLAVIX) 75 MG tablet, Take 1 tablet (75 mg total) by mouth daily., Disp: 90 tablet, Rfl: 2 .  famotidine (PEPCID) 20 MG tablet, One at bedtime, Disp: 30 tablet, Rfl: 11 .  glucose blood (FREESTYLE TEST STRIPS) test strip, Check blood sugar no more than twice daily, Disp: 100 each, Rfl: 12 .  isosorbide mononitrate (IMDUR) 30 MG 24 hr tablet, Take 1 tablet (30 mg total) by mouth 2 (two) times daily., Disp: 180 tablet, Rfl: 2 .  loratadine (CLARITIN) 10 MG tablet, Take 10 mg by mouth daily as needed for allergies. , Disp: , Rfl:  .  metFORMIN (GLUCOPHAGE) 500 MG tablet, TAKE ONE TABLET BY MOUTH TWICE DAILY WITH A MEAL, Disp: 180 tablet, Rfl: 0 .  Misc. Devices (ACAPELLA) MISC, Use as directed, Disp: 1 each, Rfl: 0 .  mometasone-formoterol (DULERA) 200-5 MCG/ACT AERO, Inhale 2 puffs into the lungs 2 (two) times daily., Disp: 1 Inhaler, Rfl: 0 .  montelukast (SINGULAIR) 10 MG tablet, Take 1 tablet (10 mg total) by mouth at bedtime., Disp: 90 tablet, Rfl: 2 .   Multiple Vitamin (MULTIVITAMIN WITH MINERALS) TABS tablet, Take 1 tablet by mouth daily., Disp: , Rfl:  .  nitroGLYCERIN (NITROSTAT) 0.4 MG SL tablet, Place 1 tablet (0.4 mg total) under the tongue every 5 (five) minutes as needed for chest pain (up to 3 doses only.)., Disp: 25 tablet, Rfl: 3 .  pantoprazole (PROTONIX) 40 MG tablet, TAKE ONE TABLET BY MOUTH ONCE DAILY **TAKE  30-60  MINUTES  BEFORE  THE  FIRST  MEAL  OF  THE  DAY**, Disp: 30 tablet, Rfl: 11 .  pravastatin (PRAVACHOL) 40 MG tablet, Take 1 tablet (40 mg total) by mouth daily., Disp: 90 tablet, Rfl: 2  Past Medical History: Past Medical History  Diagnosis Date  . COPD with asthma ([place])   . CAD (coronary artery disease)     a. s/p MI 1992. b. Prior hx stenting to RCA/Cx. b. 05/2013: [place] s/p DES to distal RCA then staged DES to prox LAD.   . Diabetes mellitus   . Hyperlipidemia   . Allergic rhinitis   . Aortic dissection ([place])     f/u by cards - AAA with distal dissection.  . Shingles   . Lichen planus     Right midline inferior chest, Dr. [name] Sensing    Tobacco Use: History  Smoking status  . Former  Smoker -- 1.50 packs/day for 39 years  . Types: Cigarettes  . Quit date: 08/24/1992  Smokeless tobacco  . Never Used    Labs: Recent Review Flowsheet Data    Labs for ITP Cardiac and Pulmonary Rehab Latest Ref Rng 01/17/2014 06/19/2014 01/02/2015 09/10/2015 02/10/2016   Cholestrol 0 - 200 mg/dL - 129 - 170 -   LDLCALC 0 - 99 mg/dL - 68 - 93 -   HDL >39.00 mg/dL - 31.70(L) - 39.80 -   Trlycerides 0.0 - 149.0 mg/dL - 148.0 - 183.0(H) -   Hemoglobin A1c 4.6 - 6.5 % 6.4 6.5 6.4 6.6(H) 6.2      Capillary Blood Glucose: Lab Results  Component Value Date   GLUCAP 110* 06/09/2013   GLUCAP 113* 06/09/2013       POCT Glucose      01/14/16 1546 01/21/16 1521 01/23/16 1621 01/28/16 1546 01/30/16 1611   POCT Blood Glucose   Pre-Exercise 118 mg/dL 117 mg/dL 135 mg/dL 110 mg/dL 131 mg/dL   Post-Exercise 104 mg/dL 116  mg/dL 96 mg/dL  Pt given banana  95 mg/dL  given banana to eat before discharge 104 mg/dL     02/04/16 1614 02/11/16 1621 02/18/16 1540 02/20/16 1712 02/27/16 1550   POCT Blood Glucose   Pre-Exercise 137 mg/dL 124 mg/dL 127 mg/dL 146 mg/dL 186 mg/dL   Post-Exercise 112 mg/dL 104 mg/dL 124 mg/dL 102 mg/dL    Pre-Exercise #2     98 mg/dL     03/03/16 1536           POCT Blood Glucose   Pre-Exercise 124 mg/dL       Post-Exercise 123 mg/dL          ADL UCSD:     Pulmonary Assessment Scores      01/07/16 1657       ADL UCSD   ADL Phase Entry     SOB Score total 30     CAT Score   CAT Score 21        Pulmonary Function Assessment:     Pulmonary Function Assessment - 01/03/16 1132    Breath   Bilateral Breath Sounds Wheezes  left upper and lower lobe   Shortness of Breath Limiting activity;Yes      Exercise Target Goals:    Exercise Program Goal: Individual exercise prescription set with THRR, safety & activity barriers. Participant demonstrates ability to understand and report RPE using BORG scale, to self-measure pulse accurately, and to acknowledge the importance of the exercise prescription.  Exercise Prescription Goal: Starting with aerobic activity 30 plus minutes a day, 3 days per week for initial exercise prescription. Provide home exercise prescription and guidelines that participant acknowledges understanding prior to discharge.  Activity Barriers & Risk Stratification:     Activity Barriers & Cardiac Risk Stratification - 01/03/16 1131    Activity Barriers & Cardiac Risk Stratification   Activity Barriers Shortness of Breath;Arthritis      6 Minute Walk:     6 Minute Walk      01/07/16 1559       6 Minute Walk   Phase Initial     Distance 1610 feet     Walk Time 6 minutes     # of Rest Breaks 0     MPH 3.04     METS 3.53     RPE 14     Perceived Dyspnea  3     Symptoms No     Resting HR  70 bpm     Resting BP 152/88 mmHg     Max Ex.  HR 109 bpm     Max Ex. BP 197/74 mmHg     2 Minute Post BP 178/81 mmHg     Interval HR   Baseline HR 70     1 Minute HR 89     2 Minute HR 90     3 Minute HR 92     4 Minute HR 100     5 Minute HR 102     6 Minute HR 109     2 Minute Post HR 84     Interval Oxygen   Baseline Oxygen Saturation % 97 %     Baseline Liters of Oxygen 0 L     1 Minute Oxygen Saturation % 95 %     1 Minute Liters of Oxygen 0 L     2 Minute Oxygen Saturation % 93 %     2 Minute Liters of Oxygen 0 L     3 Minute Oxygen Saturation % 90 %     3 Minute Liters of Oxygen 0 L     4 Minute Oxygen Saturation % 89 %     4 Minute Liters of Oxygen 0 L     5 Minute Oxygen Saturation % 89 %     5 Minute Liters of Oxygen 0 L     6 Minute Oxygen Saturation % 87 %     6 Minute Liters of Oxygen 0 L     2 Minute Post Oxygen Saturation % 96 %     2 Minute Post Liters of Oxygen 0 L        Initial Exercise Prescription:     Initial Exercise Prescription - 01/07/16 1600    Date of Initial Exercise RX and Referring Provider   Date 01/07/16   Referring Provider Dr. [name]   Oxygen   Oxygen --  room air   Treadmill   MPH 2   Grade 1   Minutes 15   NuStep   Level 3   Minutes 15   METs 1.8   Rower   Level 1   Minutes 15   Prescription Details   Frequency (times per week) 2   Duration Progress to 45 minutes of aerobic exercise without signs/symptoms of physical distress   Intensity   THRR 40-80% of Max Heartrate 55-110   Ratings of Perceived Exertion 11-13   Perceived Dyspnea 0-4   Progression   Progression Continue to progress workloads to maintain intensity without signs/symptoms of physical distress.   Resistance Training   Training Prescription Yes   Weight blue bands   Reps 10-12      Perform Capillary Blood Glucose checks as needed.  Exercise Prescription Changes:     Exercise Prescription Changes      01/14/16 1500 01/16/16 1500 01/21/16 1500 01/23/16 1600 01/28/16 1500   Exercise Review    Progression  Yes  Yes Yes   Response to Exercise   Blood Pressure (Admit) 140/64 mmHg 154/74 mmHg 144/68 mmHg 128/70 mmHg 148/70 mmHg   Blood Pressure (Exercise) 180/80 mmHg 132/64 mmHg 144/66 mmHg 124/60 mmHg 190/84 mmHg  recheck 162/70. BP elevated today on equipment, will follow.   Blood Pressure (Exit) 124/85 mmHg 112/64 mmHg 122/66 mmHg 126/64 mmHg 124/62 mmHg   Heart Rate (Admit) 66 bpm 73 bpm 73 bpm 63 bpm 70 bpm   Heart Rate (Exercise) 98 bpm 93 bpm 95  bpm 77 bpm 101 bpm   Heart Rate (Exit) 78 bpm 73 bpm 70 bpm 72 bpm 75 bpm   Oxygen Saturation (Admit) 98 % 93 % 94 % 96 % 98 %   Oxygen Saturation (Exercise) 90 % 90 % 91 % 91 % 90 %   Oxygen Saturation (Exit) 94 % 97 % 93 % 95 % 94 %   Rating of Perceived Exertion (Exercise) '13 11 11 11 13   ' Perceived Dyspnea (Exercise) '2 1 1 ' 0 0   Symptoms hypertension       Duration Progress to 45 minutes of aerobic exercise without signs/symptoms of physical distress Progress to 45 minutes of aerobic exercise without signs/symptoms of physical distress Progress to 45 minutes of aerobic exercise without signs/symptoms of physical distress Progress to 45 minutes of aerobic exercise without signs/symptoms of physical distress Progress to 45 minutes of aerobic exercise without signs/symptoms of physical distress   Intensity Other (comment)  40-80% HRR THRR unchanged THRR unchanged THRR unchanged THRR unchanged   Progression   Progression     Continue to progress workloads to maintain intensity without signs/symptoms of physical distress.   Resistance Training   Training Prescription Yes Yes Yes Yes Yes   Weight blue bands blue bands blue bands blue bands blue bands   Reps 10-12 10-12 10-12 10-12 10-12   Interval Training   Interval Training  No No No No   Oxygen   Oxygen --  room air       Treadmill   MPH 2 2.2 2.2 2.2 2.4   Grade '1 2 2 3 3   ' Minutes '15 15 15 15 15   ' NuStep   Level '3  4 3 5   ' Minutes '15  15 15 15   ' METs 2.3  2.8 2.5 3.2    Rower   Level '1 1 1  1   ' Minutes '15 15 15  15     ' 01/30/16 1600 02/04/16 1500 02/04/16 1600 02/06/16 1705 02/11/16 1600   Exercise Review   Progression     Yes   Response to Exercise   Blood Pressure (Admit) 116/60 mmHg  144/72 mmHg 122/70 mmHg 150/70 mmHg   Blood Pressure (Exercise) 154/60 mmHg  180/70 mmHg  170/70 mmHg   Blood Pressure (Exit) 124/66 mmHg  120/60 mmHg 136/72 mmHg 144/70 mmHg   Heart Rate (Admit) 65 bpm  73 bpm 66 bpm 65 bpm   Heart Rate (Exercise) 94 bpm  97 bpm 105 bpm 100 bpm   Heart Rate (Exit) 70 bpm  80 bpm 72 bpm 74 bpm   Oxygen Saturation (Admit) 96 %  96 % 95 % 96 %   Oxygen Saturation (Exercise) 93 %  90 % 91 % 88 %   Oxygen Saturation (Exit) 95 %  94 % 93 % 95 %   Rating of Perceived Exertion (Exercise) '9  14 13 13   ' Perceived Dyspnea (Exercise) 0  3 0 1   Comments  Reviewed home exercise with patient      Duration Progress to 45 minutes of aerobic exercise without signs/symptoms of physical distress  Progress to 45 minutes of aerobic exercise without signs/symptoms of physical distress Progress to 45 minutes of aerobic exercise without signs/symptoms of physical distress Progress to 45 minutes of aerobic exercise without signs/symptoms of physical distress   Intensity THRR unchanged  THRR unchanged THRR unchanged THRR unchanged   Progression   Progression Continue to progress workloads to maintain intensity without signs/symptoms  of physical distress.    Continue to progress workloads to maintain intensity without signs/symptoms of physical distress.   Resistance Training   Training Prescription Yes  Yes Yes Yes   Weight blue bands  blue bands blue bands blue bands   Reps 10-12  10-12 10-12 10-12   Interval Training   Interval Training No  No No No   Treadmill   MPH 2.4  2.4 2.4 2.2   Grade '3  3 3 1   ' Minutes '15  15 15 15   ' NuStep   Level '5  5  5   ' Minutes '15  15  15   ' METs 3.5  3  3.5   Rower   Level   '1 1 2   ' Minutes   '15 15 15     ' 02/13/16 1600  02/18/16 1500 02/20/16 1700 02/27/16 1500 03/03/16 1500   Exercise Review   Progression Yes       Response to Exercise   Blood Pressure (Admit) 122/70 mmHg 130/60 mmHg 144/60 mmHg 144/66 mmHg 120/60 mmHg   Blood Pressure (Exercise) 160/60 mmHg 154/72 mmHg 120/68 mmHg 124/64 mmHg 160/70 mmHg   Blood Pressure (Exit) 136/76 mmHg 118/64 mmHg 122/60 mmHg 112/60 mmHg 130/60 mmHg   Heart Rate (Admit) 65 bpm 69 bpm 62 bpm 63 bpm 67 bpm   Heart Rate (Exercise) 88 bpm 100 bpm 102 bpm 100 bpm 102 bpm   Heart Rate (Exit) 76 bpm 78 bpm 74 bpm 72 bpm 79 bpm   Oxygen Saturation (Admit) 96 % 95 % 96 % 94 % 96 %   Oxygen Saturation (Exercise) 89 % 91 % 87 % 87 % 89 %   Oxygen Saturation (Exit) 95 % 95 % 94 % 94 % 94 %   Rating of Perceived Exertion (Exercise) '13 15 11 13 13   ' Perceived Dyspnea (Exercise) '1 1 1 ' 0 1   Duration Progress to 45 minutes of aerobic exercise without signs/symptoms of physical distress Progress to 45 minutes of aerobic exercise without signs/symptoms of physical distress Progress to 45 minutes of aerobic exercise without signs/symptoms of physical distress Progress to 45 minutes of aerobic exercise without signs/symptoms of physical distress Progress to 45 minutes of aerobic exercise without signs/symptoms of physical distress   Intensity THRR unchanged THRR unchanged THRR unchanged THRR unchanged THRR unchanged   Progression   Progression Continue to progress workloads to maintain intensity without signs/symptoms of physical distress. Continue to progress workloads to maintain intensity without signs/symptoms of physical distress. Continue to progress workloads to maintain intensity without signs/symptoms of physical distress. Continue to progress workloads to maintain intensity without signs/symptoms of physical distress. Continue to progress workloads to maintain intensity without signs/symptoms of physical distress.   Resistance Training   Training Prescription Yes Yes Yes Yes Yes    Weight blue bands blue bands blue bands blue bands blue bands   Reps 10-12 10-12 10-12 10-12 10-12   Interval Training   Interval Training No No No No No   Treadmill   MPH 2.4 2.4 2.4 2.4 2.4   Grade '3 3 3 3 3   ' Minutes '15 17 17 17 ' 34   NuStep   Level '6 6   6   ' Minutes '15 17   17   ' METs 3.4 3.2   3.6   Rower   Level '2 2 2 2 ' --  pain in left shoulder with trying to use put pt on treadmill   Minutes 15 17 17  17       Exercise Comments:     Exercise Comments      02/04/16 1512 02/06/16 0857 03/05/16 0917       Exercise Comments Reviewed home exercise with patient Patient has increased MET level from 2.3 to 3.4. Monitoring BP closely. Will cont. to monitor.  met levels have increased to 3.6, BP improved, progressing well.        Discharge Exercise Prescription (Final Exercise Prescription Changes):     Exercise Prescription Changes - 03/03/16 1500    Response to Exercise   Blood Pressure (Admit) 120/60 mmHg   Blood Pressure (Exercise) 160/70 mmHg   Blood Pressure (Exit) 130/60 mmHg   Heart Rate (Admit) 67 bpm   Heart Rate (Exercise) 102 bpm   Heart Rate (Exit) 79 bpm   Oxygen Saturation (Admit) 96 %   Oxygen Saturation (Exercise) 89 %   Oxygen Saturation (Exit) 94 %   Rating of Perceived Exertion (Exercise) 13   Perceived Dyspnea (Exercise) 1   Duration Progress to 45 minutes of aerobic exercise without signs/symptoms of physical distress   Intensity THRR unchanged   Progression   Progression Continue to progress workloads to maintain intensity without signs/symptoms of physical distress.   Resistance Training   Training Prescription Yes   Weight blue bands   Reps 10-12   Interval Training   Interval Training No   Treadmill   MPH 2.4   Grade 3   Minutes 34   NuStep   Level 6   Minutes 17   METs 3.6   Rower   Level --  pain in left shoulder with trying to use put pt on treadmill       Nutrition:  Target Goals: Understanding of nutrition guidelines,  daily intake of sodium <1552m, cholesterol <2035m calories 30% from fat and 7% or less from saturated fats, daily to have 5 or more servings of fruits and vegetables.  Biometrics:    Nutrition Therapy Plan and Nutrition Goals:     Nutrition Therapy & Goals - 02/20/16 1510    Nutrition Therapy   Diet General, Healthful diet   Personal Nutrition Goals   Personal Goal #1 Maintain wt while in Pulmonary Rehab   Intervention Plan   Intervention Prescribe, educate and counsel regarding individualized specific dietary modifications aiming towards targeted core components such as weight, hypertension, lipid management, diabetes, heart failure and other comorbidities.   Expected Outcomes Short Term Goal: Understand basic principles of dietary content, such as calories, fat, sodium, cholesterol and nutrients.;Long Term Goal: Adherence to prescribed nutrition plan.      Nutrition Discharge: Rate Your Plate Scores:     Nutrition Assessments - 02/20/16 1429    Rate Your Plate Scores   Pre Score 56      Psychosocial: Target Goals: Acknowledge presence or absence of depression, maximize coping skills, provide positive support system. Participant is able to verbalize types and ability to use techniques and skills needed for reducing stress and depression.  Initial Review & Psychosocial Screening:     Initial Psych Review & Screening - 01/03/16 1147    Family Dynamics   Good Support System? Yes   Barriers   Psychosocial barriers to participate in program There are no identifiable barriers or psychosocial needs.   Screening Interventions   Interventions Encouraged to exercise      Quality of Life Scores:     Quality of Life - 01/07/16 1656    Quality of Life Scores  Health/Function Pre 25.87 %   Socioeconomic Pre 26.22 %   Psych/Spiritual Pre 23.14 %   Family Pre 26.25 %   GLOBAL Pre 25.46 %      PHQ-9:     Recent Review Flowsheet Data    Depression screen [facility] 2/9  03/04/2016 01/03/2016 12/12/2015 05/02/2015 01/02/2015   Decreased Interest 0 0 0 0 0   Down, Depressed, Hopeless 0 0 0 0 0   PHQ - 2 Score 0 0 0 0 0      Psychosocial Evaluation and Intervention:   Psychosocial Re-Evaluation:     Psychosocial Re-Evaluation      02/04/16 1659 03/02/16 1314         Psychosocial Re-Evaluation   Interventions Encouraged to attend Pulmonary Rehabilitation for the exercise Encouraged to attend Pulmonary Rehabilitation for the exercise      Comments no psychosocial issues identified No psychosocial issues identified at this time.      Continued Psychosocial Services Needed No No        Education: Education Goals: Education classes will be provided on a weekly basis, covering required topics. Participant will state understanding/return demonstration of topics presented.  Learning Barriers/Preferences:     Learning Barriers/Preferences - 01/03/16 1132    Learning Barriers/Preferences   Learning Barriers None   Learning Preferences Group Instruction;Individual Instruction;Skilled Demonstration;Verbal Instruction;Video;Written Material      Education Topics: Risk Factor Reduction:  -Group instruction that is supported by a PowerPoint presentation. Instructor discusses the definition of a risk factor, different risk factors for pulmonary disease, and how the heart and lungs work together.     Nutrition for Pulmonary Patient:  -Group instruction provided by PowerPoint slides, verbal discussion, and written materials to support subject matter. The instructor gives an explanation and review of healthy diet recommendations, which includes a discussion on weight management, recommendations for fruit and vegetable consumption, as well as protein, fluid, caffeine, fiber, sodium, sugar, and alcohol. Tips for eating when patients are short of breath are discussed.          PULMONARY REHAB CHRONIC OBSTRUCTIVE PULMONARY DISEASE from 02/27/2016 in [place]   Date  01/23/16   Educator  RD   Instruction Review Code  2- meets goals/outcomes      Pursed Lip Breathing:  -Group instruction that is supported by demonstration and informational handouts. Instructor discusses the benefits of pursed lip and diaphragmatic breathing and detailed demonstration on how to preform both.     Oxygen Safety:  -Group instruction provided by PowerPoint, verbal discussion, and written material to support subject matter. There is an overview of "What is Oxygen" and "Why do we need it".  Instructor also reviews how to create a safe environment for oxygen use, the importance of using oxygen as prescribed, and the risks of noncompliance. There is a brief discussion on traveling with oxygen and resources the patient may utilize.      PULMONARY REHAB CHRONIC OBSTRUCTIVE PULMONARY DISEASE from 02/27/2016 in [place]   Date  02/06/16   Educator  RN   Instruction Review Code  2- meets goals/outcomes      Oxygen Equipment:  -Group instruction provided by [facility] Staff utilizing handouts, written materials, and equipment demonstrations.      PULMONARY REHAB CHRONIC OBSTRUCTIVE PULMONARY DISEASE from 02/27/2016 in [place]   Date  02/20/16   Educator  [employer] rep   Instruction Review Code  2- meets  goals/outcomes      Signs and Symptoms:  -Group instruction provided by written material and verbal discussion to support subject matter. Warning signs and symptoms of infection, stroke, and heart attack are reviewed and when to call the physician/911 reinforced. Tips for preventing the spread of infection discussed.   Advanced Directives:  -Group instruction provided by verbal instruction and written material to support subject matter. Instructor reviews Advanced Directive laws and proper instruction for filling out document.   Pulmonary Video:  -Group video education that reviews  the importance of medication and oxygen compliance, exercise, good nutrition, pulmonary hygiene, and pursed lip and diaphragmatic breathing for the pulmonary patient.      PULMONARY REHAB CHRONIC OBSTRUCTIVE PULMONARY DISEASE from 02/27/2016 in [place]   Date  01/30/16   Instruction Review Code  2- meets goals/outcomes      Exercise for the Pulmonary Patient:  -Group instruction that is supported by a PowerPoint presentation. Instructor discusses benefits of exercise, core components of exercise, frequency, duration, and intensity of an exercise routine, importance of utilizing pulse oximetry during exercise, safety while exercising, and options of places to exercise outside of rehab.        PULMONARY REHAB CHRONIC OBSTRUCTIVE PULMONARY DISEASE from 02/27/2016 in [place]   Date  02/27/16   Educator  EP   Instruction Review Code  2- meets goals/outcomes      Pulmonary Medications:  -Verbally interactive group education provided by instructor with focus on inhaled medications and proper administration.   Anatomy and Physiology of the Respiratory System and Intimacy:  -Group instruction provided by PowerPoint, verbal discussion, and written material to support subject matter. Instructor reviews respiratory cycle and anatomical components of the respiratory system and their functions. Instructor also reviews differences in obstructive and restrictive respiratory diseases with examples of each. Intimacy, Sex, and Sexuality differences are reviewed with a discussion on how relationships can change when diagnosed with pulmonary disease. Common sexual concerns are reviewed.   Knowledge Questionnaire Score:     Knowledge Questionnaire Score - 01/07/16 1656    Knowledge Questionnaire Score   Pre Score 11/13      Core Components/Risk Factors/Patient Goals at Admission:     Personal Goals and Risk Factors at Admission -  01/03/16 1140    Core Components/Risk Factors/Patient Goals on Admission   Increase Strength and Stamina Yes   Intervention Provide advice, education, support and counseling about physical activity/exercise needs.;Develop an individualized exercise prescription for aerobic and resistive training based on initial evaluation findings, risk stratification, comorbidities and participant's personal goals.   Expected Outcomes Achievement of increased cardiorespiratory fitness and enhanced flexibility, muscular endurance and strength shown through measurements of functional capacity and personal statement of participant.   Develop more efficient breathing techniques such as purse lipped breathing and diaphragmatic breathing; and practicing self-pacing with activity Yes   Intervention Provide education, demonstration and support about specific breathing techniuqes utilized for more efficient breathing. Include techniques such as pursed lipped breathing, diaphragmatic breathing and self-pacing activity.   Expected Outcomes Short Term: Participant will be able to demonstrate and use breathing techniques as needed throughout daily activities.   Increase knowledge of respiratory medications and ability to use respiratory devices properly  Yes   Intervention Provide education and demonstration as needed of appropriate use of medications, inhalers, and oxygen therapy.   Expected Outcomes Short Term: Achieves understanding of medications use. Understands that oxygen is a medication prescribed by physician.  Demonstrates appropriate use of inhaler and oxygen therapy.   Personal Goal Other Yes   Personal Goal have more energy to do the things I enjoy, gardening, singing in the choir and remain active    Intervention increase workloads on the exercise equiptment as tolerated inorder to increase statmin and strength and to decrease shortness of breath with exertion   Expected Outcomes patient state he has more energy       Core Components/Risk Factors/Patient Goals Review:      Goals and Risk Factor Review      02/04/16 1657 03/02/16 1311         Core Components/Risk Factors/Patient Goals Review   Personal Goals Review Increase Strength and Stamina;Improve shortness of breath with ADL's;Develop more efficient breathing techniques such as purse lipped breathing and diaphragmatic breathing and practicing self-pacing with activity. Increase Strength and Stamina;Improve shortness of breath with ADL's;Develop more efficient breathing techniques such as purse lipped breathing and diaphragmatic breathing and practicing self-pacing with activity.      Review strength and stamina are improving, addressing hypertension with Dr. [name], Jceon has an appointment to evaluate BP elevation Half way through program, progressing well, is exercising on his days away from pulmonary rehab, up to treadmill speed of 2.4 mph and a 3% incline, level 2 on rower, and level 6 on nustep.  BP improved.      Expected Outcomes Lower BP and improved strength and stamina Continue to increase workloads as tolerated.         Core Components/Risk Factors/Patient Goals at Discharge (Final Review):      Goals and Risk Factor Review - 03/02/16 1311    Core Components/Risk Factors/Patient Goals Review   Personal Goals Review Increase Strength and Stamina;Improve shortness of breath with ADL's;Develop more efficient breathing techniques such as purse lipped breathing and diaphragmatic breathing and practicing self-pacing with activity.   Review Half way through program, progressing well, is exercising on his days away from pulmonary rehab, up to treadmill speed of 2.4 mph and a 3% incline, level 2 on rower, and level 6 on nustep.  BP improved.   Expected Outcomes Continue to increase workloads as tolerated.      ITP Comments:   Comments: ITP REVIEW Pt is making expected progress toward personal goals after completing 14 sessions.   Recommend  continued exercise, life style modification, education, and utilization of breathing techniques to increase stamina and strength and decrease shortness of breath with exertion.

## 2016-03-05 NOTE — Progress Notes (Signed)
Daily Session Note  Patient Details  Name: HAGER COMPSTON MRN: [id number] Date of Birth: [birth year] Referring Provider:        Pulmonary Rehab Walk Test from 01/07/2016 in [place]   Referring Provider  Dr. [name]      Encounter Date: 03/05/2016  Check In:     Session Check In - 03/05/16 1559    Check-In   Location [facility]   Staff Present Rosebud Poles, RN, BSN;Lisa Ysidro Evert, RN;Portia Rollene Rotunda, RN, BSN;Ramon Dredge, RN, [facility]   Supervising physician immediately available to respond to emergencies [facility] immediately available   Physician(s) DR. Gobata   Medication changes reported     No   Fall or balance concerns reported    No   Warm-up and Cool-down Performed as group-led instruction   Resistance Training Performed Yes   VAD Patient? No   Pain Assessment   Currently in Pain? No/denies   Multiple Pain Sites No      Capillary Blood Glucose: No results found for this or any previous visit (from the past 24 hour(s)).     POCT Glucose - 03/05/16 1558    POCT Blood Glucose   Pre-Exercise 150 mg/dL   Post-Exercise 150 mg/dL         Exercise Prescription Changes - 03/05/16 1600    Response to Exercise   Blood Pressure (Admit) 124/60 mmHg   Blood Pressure (Exercise) 130/62 mmHg   Blood Pressure (Exit) 116/64 mmHg   Heart Rate (Admit) 65 bpm   Heart Rate (Exercise) 93 bpm   Heart Rate (Exit) 72 bpm   Oxygen Saturation (Admit) 95 %   Oxygen Saturation (Exercise) 93 %   Oxygen Saturation (Exit) 94 %   Rating of Perceived Exertion (Exercise) 15   Perceived Dyspnea (Exercise) 3   Duration Progress to 45 minutes of aerobic exercise without signs/symptoms of physical distress   Intensity THRR unchanged   Progression   Progression Continue to progress workloads to maintain intensity without signs/symptoms of physical distress.   Resistance Training   Training Prescription Yes   Weight blue bands   Reps  10-12   Interval Training   Interval Training No   Treadmill   MPH 2.4   Grade 3   Minutes 17   NuStep   Level 6   Minutes 17   METs 3.5     Goals Met:  Independence with exercise equipment Improved SOB with ADL's Using PLB without cueing & demonstrates good technique Exercise tolerated well Strength training completed today  Goals Unmet:  Not Applicable  Comments: Service time is from 1330 to 1530    Dr. Rush Farmer is Medical Director for Pulmonary Rehab at [facility].

## 2016-03-09 ENCOUNTER — Ambulatory Visit: Payer: PPO | Admitting: Internal Medicine

## 2016-03-09 ENCOUNTER — Ambulatory Visit (INDEPENDENT_AMBULATORY_CARE_PROVIDER_SITE_OTHER): Payer: PPO | Admitting: Internal Medicine

## 2016-03-09 ENCOUNTER — Encounter: Payer: Self-pay | Admitting: Internal Medicine

## 2016-03-09 VITALS — BP 122/76 | HR 82 | Temp 97.7°F | Ht 69.0 in | Wt 169.4 lb

## 2016-03-09 DIAGNOSIS — E11319 Type 2 diabetes mellitus with unspecified diabetic retinopathy without macular edema: Secondary | ICD-10-CM

## 2016-03-09 DIAGNOSIS — J449 Chronic obstructive pulmonary disease, unspecified: Secondary | ICD-10-CM | POA: Diagnosis not present

## 2016-03-09 NOTE — Progress Notes (Signed)
Subjective:    Patient ID: Phillip Jordan, male    DOB: 12-Apr-1930, 82 y.o.   MRN: [id number]  DOS:  03/09/2016 Type of visit - description : acute Interval history: Here to discuss diabetes management, currently taking metformin 500 mg twice a day, he goes to rehabilitation and exercises regularly, people at rehabilitation has noted that his sugars go to less than 100 after exertion and they are concerned.  Reports that when his sugar is in the low side he feels is slightly sweaty and jittery   Review of Systems  denies chest pain, lower extremity edema COPD -- great doing great, decreased cough, wheezing. Increase endurance , thinks is better due to  rehab  Past Medical History  Diagnosis Date  . COPD with asthma ([place])   . CAD (coronary artery disease)     a. s/p MI 1992. b. Prior hx stenting to RCA/Cx. b. 05/2013: [place] s/p DES to distal RCA then staged DES to prox LAD.   . Diabetes mellitus   . Hyperlipidemia   . Allergic rhinitis   . Aortic dissection ([place])     f/u by cards - AAA with distal dissection.  . Shingles   . Lichen planus     Right midline inferior chest, Dr. [name] Sensing    Past Surgical History  Procedure Laterality Date  . Back surgery  1993  . Cardiac catheterization      left, w/ coronary angiography and left ventriculograpy  . Ptca      w/ placement of drugeluting stent in the distal right coronary artery, and distal Cardiologist: [name]  . Left heart catheterization with coronary angiogram N/A 06/09/2013    Procedure: LEFT HEART CATHETERIZATION WITH CORONARY ANGIOGRAM;  Surgeon: [name], MD;  Location: [facility] CATH LAB;  Service: Cardiovascular;  Laterality: N/A;  . Percutaneous coronary stent intervention (pci-s) N/A 06/12/2013    Procedure: PERCUTANEOUS CORONARY STENT INTERVENTION (PCI-S);  Surgeon: [name], MD;  Location: [facility] CATH LAB;  Service: Cardiovascular;  Laterality: N/A;  prox LAD    Social History   Social  History  . Marital Status: Married    Spouse Name: N/A  . Number of Children: 3  . Years of Education: N/A   Occupational History  . retired    Social History Main Topics  . Smoking status: Former Smoker -- 1.50 packs/day for 39 years    Types: Cigarettes    Quit date: 08/24/1992  . Smokeless tobacco: Never Used  . Alcohol Use: No  . Drug Use: No  . Sexual Activity: Not on file   Other Topics Concern  . Not on file   Social History Narrative   Lives w/ wife, does do some  local mission work  .   3 kids, 11 Gk-GGK                     Medication List       This list is accurate as of: 03/09/16 11:59 PM.  Always use your most recent med list.               ACAPELLA Misc  Use as directed     acetaminophen 500 MG tablet  Commonly known as:  TYLENOL  Take 1,000 mg by mouth every 6 (six) hours as needed for pain. Reported on 03/09/2016     albuterol 108 (90 Base) MCG/ACT inhaler  Commonly known as:  PROAIR HFA  2 puffs every 4 hours as  needed only  if your can't catch your breath     aspirin 81 MG tablet  Take 1 tablet (81 mg total) by mouth daily.     clopidogrel 75 MG tablet  Commonly known as:  PLAVIX  Take 1 tablet (75 mg total) by mouth daily.     famotidine 20 MG tablet  Commonly known as:  PEPCID  One at bedtime     glucose blood test strip  Commonly known as:  FREESTYLE TEST STRIPS  Check blood sugar no more than twice daily     isosorbide mononitrate 30 MG 24 hr tablet  Commonly known as:  IMDUR  Take 1 tablet (30 mg total) by mouth 2 (two) times daily.     loratadine 10 MG tablet  Commonly known as:  CLARITIN  Take 10 mg by mouth daily as needed for allergies.     metFORMIN 500 MG tablet  Commonly known as:  GLUCOPHAGE  Take 500 mg by mouth daily. With dinner     mometasone-formoterol 200-5 MCG/ACT Aero  Commonly known as:  DULERA  Inhale 2 puffs into the lungs 2 (two) times daily.     montelukast 10 MG tablet  Commonly known as:   SINGULAIR  Take 1 tablet (10 mg total) by mouth at bedtime.     multivitamin with minerals Tabs tablet  Take 1 tablet by mouth daily.     nitroGLYCERIN 0.4 MG SL tablet  Commonly known as:  NITROSTAT  Place 1 tablet (0.4 mg total) under the tongue every 5 (five) minutes as needed for chest pain (up to 3 doses only.).     pantoprazole 40 MG tablet  Commonly known as:  PROTONIX  TAKE ONE TABLET BY MOUTH ONCE DAILY **TAKE  30-60  MINUTES  BEFORE  THE  FIRST  MEAL  OF  THE  DAY**     pravastatin 40 MG tablet  Commonly known as:  PRAVACHOL  Take 1 tablet (40 mg total) by mouth daily.           Objective:   Physical Exam BP 122/76 mmHg  Pulse 82  Temp(Src) 97.7 F (36.5 C) (Oral)  Ht 5\' 9"  (1.753 m)  Wt 169 lb 6 oz (76.828 kg)  BMI 25.00 kg/m2  SpO2 96% General:   Well developed, well nourished . NAD.  HEENT:  Normocephalic . Face symmetric, atraumatic Lungs:  Few dry crackles @ bases  Heart: RRR,  no murmur.  No pretibial edema bilaterally  Skin: Not pale. Not jaundice Neurologic:  alert & oriented X3.  Speech normal, gait appropriate for age and unassisted Psych--  Cognition and judgment appear intact.  Cooperative with normal attention span and concentration.  Behavior appropriate. No anxious or depressed appearing.      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 Ronnald Ramp Chronic hoarseness, status post ENT eval ~ 2012 Normal DEXA 2012  PLAN DM: last A1C 6.2, CBGs getting < 100 w/ exertion, don't think he will get severe hypoglycemia as is only on metformin but we agreed to decrease dose from 500 bid to 500 w/ dinner (the largest meal of the day) COPD:  doing so well that the pt and rehab staff feel he could graduate early from rehab, I agree RTC  06-2016, ROV

## 2016-03-09 NOTE — Progress Notes (Signed)
Pre visit review using our clinic review tool, if applicable. No additional management support is needed unless otherwise documented below in the visit note. 

## 2016-03-10 ENCOUNTER — Encounter (HOSPITAL_COMMUNITY)
Admission: RE | Admit: 2016-03-10 | Discharge: 2016-03-10 | Disposition: A | Discharge status: Home / Self Care | Payer: PPO | Source: Ambulatory Visit | Attending: Internal Medicine | Admitting: Internal Medicine

## 2016-03-10 VITALS — Wt 170.2 lb

## 2016-03-10 DIAGNOSIS — J449 Chronic obstructive pulmonary disease, unspecified: Secondary | ICD-10-CM

## 2016-03-10 DIAGNOSIS — D1801 Hemangioma of skin and subcutaneous tissue: Secondary | ICD-10-CM | POA: Diagnosis not present

## 2016-03-10 DIAGNOSIS — Z85828 Personal history of other malignant neoplasm of skin: Secondary | ICD-10-CM | POA: Diagnosis not present

## 2016-03-10 DIAGNOSIS — D2262 Melanocytic nevi of left upper limb, including shoulder: Secondary | ICD-10-CM | POA: Diagnosis not present

## 2016-03-10 DIAGNOSIS — L570 Actinic keratosis: Secondary | ICD-10-CM | POA: Diagnosis not present

## 2016-03-10 DIAGNOSIS — D225 Melanocytic nevi of trunk: Secondary | ICD-10-CM | POA: Diagnosis not present

## 2016-03-10 DIAGNOSIS — D692 Other nonthrombocytopenic purpura: Secondary | ICD-10-CM | POA: Diagnosis not present

## 2016-03-10 NOTE — Progress Notes (Signed)
Daily Session Note  Patient Details  Name: Phillip Jordan MRN: [id number] Date of Birth: [birth year] Referring Provider:        Pulmonary Rehab Walk Test from 01/07/2016 in [place]   Referring Provider  Dr. [name]      Encounter Date: 03/10/2016  Check In:     Session Check In - 03/10/16 1512    Check-In   Location [facility]   Staff Present Su Hilt, MS, ACSM RCEP, Exercise Physiologist;Annedrea Stackhouse, RN, MHA;Portia Rollene Rotunda, RN, BSN;Jamarquis Crull Ysidro Evert, RN;Joan Leonia Reeves, RN, BSN   Supervising physician immediately available to respond to emergencies [facility] immediately available   Physician(s) Dr. Marily Memos   Medication changes reported     No   Fall or balance concerns reported    No   Warm-up and Cool-down Performed as group-led instruction   Resistance Training Performed Yes   VAD Patient? No   Pain Assessment   Currently in Pain? No/denies   Multiple Pain Sites No      Capillary Blood Glucose: No results found for this or any previous visit (from the past 24 hour(s)).     POCT Glucose - 03/10/16 1547    POCT Blood Glucose   Pre-Exercise 140 mg/dL   Post-Exercise 113 mg/dL         Exercise Prescription Changes - 03/10/16 1500    Response to Exercise   Blood Pressure (Admit) 140/66 mmHg   Blood Pressure (Exercise) 158/66 mmHg   Blood Pressure (Exit) 126/60 mmHg   Heart Rate (Admit) 68 bpm   Heart Rate (Exercise) 97 bpm   Heart Rate (Exit) 72 bpm   Oxygen Saturation (Admit) 95 %   Oxygen Saturation (Exercise) 91 %   Oxygen Saturation (Exit) 95 %   Rating of Perceived Exertion (Exercise) 13   Perceived Dyspnea (Exercise) 1   Duration Progress to 45 minutes of aerobic exercise without signs/symptoms of physical distress   Intensity THRR unchanged   Progression   Progression Continue to progress workloads to maintain intensity without signs/symptoms of physical distress.   Resistance Training   Training Prescription Yes   Weight blue bands   Reps 10-12  10 minutes of strength training   Interval Training   Interval Training No   Treadmill   MPH 2.4   Grade 3   Minutes 17   NuStep   Level 6   Minutes 17   METs 3.4   Rower   Level 2   Minutes 17     Goals Met:  Exercise tolerated well No report of cardiac concerns or symptoms Strength training completed today  Goals Unmet:  Not Applicable  Comments: Service time is from 1330 to 1500    Dr. Rush Farmer is Medical Director for Pulmonary Rehab at [facility].

## 2016-03-10 NOTE — Assessment & Plan Note (Signed)
DM: last A1C 6.2, CBGs getting < 100 w/ exertion, don't think he will get severe hypoglycemia as is only on metformin but we agreed to decrease dose from 500 bid to 500 w/ dinner (the largest meal of the day) COPD:  doing so well that the pt and rehab staff feel he could graduate early from rehab, I agree RTC 06-2016, ROV

## 2016-03-12 ENCOUNTER — Encounter (HOSPITAL_COMMUNITY)
Admission: RE | Admit: 2016-03-12 | Discharge: 2016-03-12 | Disposition: A | Discharge status: Home / Self Care | Payer: PPO | Source: Ambulatory Visit | Attending: Internal Medicine | Admitting: Internal Medicine

## 2016-03-12 VITALS — Wt 172.4 lb

## 2016-03-12 DIAGNOSIS — J449 Chronic obstructive pulmonary disease, unspecified: Secondary | ICD-10-CM | POA: Diagnosis not present

## 2016-03-12 NOTE — Progress Notes (Signed)
Daily Session Note  Patient Details  Name: RAYMAN PETROSIAN MRN: [id number] Date of Birth: [birth year] Referring Provider:        Pulmonary Rehab Walk Test from 01/07/2016 in [place]   Referring Provider  Dr. [name]      Encounter Date: 03/12/2016  Check In:     Session Check In - 03/12/16 1330    Check-In   Location [facility]   Staff Present Rosebud Poles, RN, BSN;Rangel Echeverri Ysidro Evert, RN;Portia Rollene Rotunda, RN, BSN;Ramon Dredge, RN, MHA;Molly diVincenzo, MS, ACSM RCEP, Exercise Physiologist   Supervising physician immediately available to respond to emergencies [facility] immediately available   Physician(s) Dr. Cruzita Lederer   Medication changes reported     No   Fall or balance concerns reported    No   Warm-up and Cool-down Performed as group-led instruction   Resistance Training Performed Yes   VAD Patient? No   Pain Assessment   Currently in Pain? No/denies   Multiple Pain Sites No      Capillary Blood Glucose: No results found for this or any previous visit (from the past 24 hour(s)).     POCT Glucose - 03/12/16 1642    POCT Blood Glucose   Pre-Exercise 161 mg/dL   Post-Exercise 85 mg/dL  Had snack after exercise         Exercise Prescription Changes - 03/12/16 1600    Exercise Review   Progression Yes   Response to Exercise   Blood Pressure (Admit) 108/50 mmHg   Blood Pressure (Exercise) 148/76 mmHg   Blood Pressure (Exit) 126/60 mmHg   Heart Rate (Admit) 64 bpm   Heart Rate (Exercise) 95 bpm   Heart Rate (Exit) 82 bpm   Oxygen Saturation (Admit) 97 %   Oxygen Saturation (Exercise) 89 %   Oxygen Saturation (Exit) 90 %   Rating of Perceived Exertion (Exercise) 13   Perceived Dyspnea (Exercise) 1   Duration Progress to 45 minutes of aerobic exercise without signs/symptoms of physical distress   Intensity THRR unchanged   Progression   Progression Continue to progress workloads to maintain intensity without  signs/symptoms of physical distress.   Resistance Training   Training Prescription Yes   Weight blue bands   Reps 10-12  10 minutes of strength training   Interval Training   Interval Training No   NuStep   Level 6   Minutes 17   METs 4.4   Rower   Level 3   Minutes 17     Goals Met:  Exercise tolerated well No report of cardiac concerns or symptoms Strength training completed today  Goals Unmet:  Not Applicable  Comments: Service time is from 1330 to 1540    Dr. Rush Farmer is Medical Director for Pulmonary Rehab at [facility].

## 2016-03-17 ENCOUNTER — Encounter (HOSPITAL_COMMUNITY)
Admission: RE | Admit: 2016-03-17 | Discharge: 2016-03-17 | Disposition: A | Discharge status: Home / Self Care | Payer: PPO | Source: Ambulatory Visit | Attending: Internal Medicine | Admitting: Internal Medicine

## 2016-03-17 VITALS — Wt 172.4 lb

## 2016-03-17 DIAGNOSIS — J449 Chronic obstructive pulmonary disease, unspecified: Secondary | ICD-10-CM

## 2016-03-17 NOTE — Progress Notes (Signed)
Daily Session Note  Patient Details  Name: Phillip Jordan MRN: [id number] Date of Birth: [birth year] Referring Provider:   April Manson Pulmonary Rehab Walk Test from 01/07/2016 in [place]  Referring Provider  Dr. [name]      Encounter Date: 03/17/2016  Check In:     Session Check In - 03/17/16 1525      Check-In   Location [facility]   Staff Present Rosebud Poles, RN, BSN;Molly diVincenzo, MS, ACSM RCEP, Exercise Physiologist;Portia Rollene Rotunda, RN, Roque Cash, RN   Supervising physician immediately available to respond to emergencies [facility] immediately available   Medication changes reported     Yes   Fall or balance concerns reported    No   Warm-up and Cool-down Performed as group-led instruction   Resistance Training Performed Yes   VAD Patient? No     Pain Assessment   Currently in Pain? No/denies   Multiple Pain Sites No      Capillary Blood Glucose: No results found for this or any previous visit (from the past 24 hour(s)).     POCT Glucose - 03/17/16 1604      POCT Blood Glucose   Pre-Exercise 173 mg/dL   Post-Exercise 108 mg/dL         Exercise Prescription Changes - 03/17/16 1600      Response to Exercise   Blood Pressure (Admit) 140/52   Blood Pressure (Exercise) 154/80   Blood Pressure (Exit) 110/66   Heart Rate (Admit) 71 bpm   Heart Rate (Exercise) 99 bpm   Heart Rate (Exit) 72 bpm   Oxygen Saturation (Admit) 95 %   Oxygen Saturation (Exercise) 89 %   Oxygen Saturation (Exit) 93 %   Rating of Perceived Exertion (Exercise) 13   Perceived Dyspnea (Exercise) 1   Duration Progress to 45 minutes of aerobic exercise without signs/symptoms of physical distress   Intensity THRR unchanged     Progression   Progression Continue to progress workloads to maintain intensity without signs/symptoms of physical distress.     Resistance Training   Training Prescription Yes   Weight blue bands    Reps 10-12  10 minutes of strength training     Interval Training   Interval Training No     Treadmill   MPH 2.4   Grade 3   Minutes 17     NuStep   Level 7  level was increased last session, but was not recorded   Minutes 17   METs 3.6     Rower   Level 3   Minutes 17     Goals Met:  Independence with exercise equipment Improved SOB with ADL's Exercise tolerated well Strength training completed today  Goals Unmet:  Not Applicable  Comments: Service time is from 1330 to 1510    Dr. Rush Farmer is Medical Director for Pulmonary Rehab at [facility].

## 2016-03-19 ENCOUNTER — Encounter (HOSPITAL_COMMUNITY)
Admission: RE | Admit: 2016-03-19 | Discharge: 2016-03-19 | Disposition: A | Discharge status: Home / Self Care | Payer: PPO | Source: Ambulatory Visit | Attending: Internal Medicine | Admitting: Internal Medicine

## 2016-03-19 VITALS — Wt 173.3 lb

## 2016-03-19 DIAGNOSIS — J449 Chronic obstructive pulmonary disease, unspecified: Secondary | ICD-10-CM

## 2016-03-24 ENCOUNTER — Encounter (HOSPITAL_COMMUNITY): Payer: Self-pay | Admitting: *Deleted

## 2016-03-24 ENCOUNTER — Encounter (HOSPITAL_COMMUNITY): Payer: PPO

## 2016-03-26 ENCOUNTER — Encounter (HOSPITAL_COMMUNITY): Payer: PPO

## 2016-03-31 ENCOUNTER — Encounter (HOSPITAL_COMMUNITY): Payer: PPO

## 2016-03-31 ENCOUNTER — Encounter: Payer: Self-pay | Admitting: Podiatry

## 2016-03-31 ENCOUNTER — Ambulatory Visit (INDEPENDENT_AMBULATORY_CARE_PROVIDER_SITE_OTHER): Payer: PPO | Admitting: Podiatry

## 2016-03-31 DIAGNOSIS — M79676 Pain in unspecified toe(s): Secondary | ICD-10-CM | POA: Diagnosis not present

## 2016-03-31 DIAGNOSIS — Q828 Other specified congenital malformations of skin: Secondary | ICD-10-CM

## 2016-03-31 DIAGNOSIS — E119 Type 2 diabetes mellitus without complications: Secondary | ICD-10-CM | POA: Diagnosis not present

## 2016-03-31 DIAGNOSIS — B351 Tinea unguium: Secondary | ICD-10-CM

## 2016-03-31 NOTE — Progress Notes (Signed)
Patient ID: Phillip Jordan, male   DOB: [birth year], 82 y.o.   MRN: [id number] Complaint:  Visit Type: Patient returns to my office for continued preventative foot care services. Complaint: Patient states" my nails have grown long and thick and become painful to walk and wear shoes" Patient has been diagnosed with DM with no foot complications. The patient presents for preventative foot care services. No changes to ROS.  He nhas been under treatment for COPD.  Patient has callus under the ball of right foot.  Podiatric Exam: Vascular: dorsalis pedis and posterior tibial pulses are palpable bilateral. Capillary return is immediate. Temperature gradient is WNL. Skin turgor WNL  Sensorium: Normal Semmes Weinstein monofilament test except toes both feet.. Normal tactile sensation bilaterally. Nail Exam: Pt has thick disfigured discolored nails with subungual debris noted bilateral entire nail hallux through fifth toenails Ulcer Exam: There is no evidence of ulcer or pre-ulcerative changes or infection. Orthopedic Exam: Muscle tone and strength are WNL. No limitations in general ROM. No crepitus or effusions noted. Foot type and digits show no abnormalities. Bony prominences are unremarkable. Skin: Porokeratosis sub 1 right foot. Hemmorhagic porokeratosis noted. No infection or ulcers  Diagnosis:  Onychomycosis, , Pain in right toe, pain in left toes  Treatment & Plan Procedures and Treatment: Consent by patient was obtained for treatment procedures. The patient understood the discussion of treatment and procedures well. All questions were answered thoroughly reviewed. Debridement of mycotic and hypertrophic toenails, 1 through 5 bilateral and clearing of subungual debris. No ulceration, no infection noted. Debride porokeratosis right foot. Return Visit-Office Procedure: Patient instructed to return to the office for a follow up visit 3 months for continued evaluation and treatment.

## 2016-04-02 ENCOUNTER — Encounter (HOSPITAL_COMMUNITY): Payer: PPO

## 2016-04-07 ENCOUNTER — Encounter: Payer: Self-pay | Admitting: Internal Medicine

## 2016-04-07 ENCOUNTER — Other Ambulatory Visit: Payer: Self-pay | Admitting: *Deleted

## 2016-04-07 ENCOUNTER — Encounter (HOSPITAL_COMMUNITY): Payer: PPO

## 2016-04-07 MED ORDER — ALBUTEROL SULFATE HFA 108 (90 BASE) MCG/ACT IN AERS: INHALATION_SPRAY | RESPIRATORY_TRACT | 6 refills | Status: DC

## 2016-04-09 ENCOUNTER — Encounter (HOSPITAL_COMMUNITY): Payer: Self-pay | Admitting: *Deleted

## 2016-04-09 ENCOUNTER — Encounter (HOSPITAL_COMMUNITY): Payer: PPO

## 2016-04-09 DIAGNOSIS — J449 Chronic obstructive pulmonary disease, unspecified: Secondary | ICD-10-CM

## 2016-04-09 NOTE — Progress Notes (Signed)
Discharge Summary  Patient Details  Name: Phillip Jordan MRN: [id number] Date of Birth: Nov 27, 1929 Referring Provider:   April Manson Pulmonary Rehab Walk Test from 01/07/2016 in [place]  Referring Provider  Dr. [name]       Attended 19 visits  Reason for Discharge:  Patient reached a stable level of exercise.  Smoking History:  History  Smoking Status  . Former Smoker  . Packs/day: 1.50  . Years: 39.00  . Types: Cigarettes  . Quit date: 08/24/1992  Smokeless Tobacco  . Never Used    Diagnosis:  Chronic obstructive pulmonary disease, unspecified COPD type ([place])  ADL UCSD:     Pulmonary Assessment Scores    Row Name 03/24/16 1442         ADL UCSD   ADL Phase Exit     SOB Score total 24        Initial Exercise Prescription:   Discharge Exercise Prescription (Final Exercise Prescription Changes):     Exercise Prescription Changes - 03/17/16 1600      Response to Exercise   Blood Pressure (Admit) 140/52   Blood Pressure (Exercise) 154/80   Blood Pressure (Exit) 110/66   Heart Rate (Admit) 71 bpm   Heart Rate (Exercise) 99 bpm   Heart Rate (Exit) 72 bpm   Oxygen Saturation (Admit) 95 %   Oxygen Saturation (Exercise) 89 %   Oxygen Saturation (Exit) 93 %   Rating of Perceived Exertion (Exercise) 13   Perceived Dyspnea (Exercise) 1   Duration Progress to 45 minutes of aerobic exercise without signs/symptoms of physical distress   Intensity THRR unchanged     Progression   Progression Continue to progress workloads to maintain intensity without signs/symptoms of physical distress.     Resistance Training   Training Prescription Yes   Weight blue bands   Reps 10-12  10 minutes of strength training     Interval Training   Interval Training No     Treadmill   MPH 2.4   Grade 3   Minutes 17     NuStep   Level 7  level was increased last session, but was not recorded   Minutes 17   METs 3.6     Rower   Level  3   Minutes 17      Functional Capacity:     6 Minute Walk    Row Name 03/19/16 1557         6 Minute Walk   Phase Discharge     Distance 1725 feet     Walk Time 6 minutes     # of Rest Breaks 0     MPH 3.26     METS 3.45     RPE 12     Perceived Dyspnea  3     Symptoms No     Resting HR 72 bpm     Resting BP 138/64     Max Ex. HR 112 bpm     Max Ex. BP 210/80     2 Minute Post BP 180/80       Interval HR   Baseline HR 72     1 Minute HR 80     2 Minute HR 100     3 Minute HR 102     4 Minute HR 108     5 Minute HR 108     6 Minute HR 112     2 Minute Post HR 112  Interval Heart Rate? Yes       Interval Oxygen   Interval Oxygen? Yes     Baseline Oxygen Saturation % 94 %     Baseline Liters of Oxygen 0 L     1 Minute Oxygen Saturation % 94 %     1 Minute Liters of Oxygen 0 L     2 Minute Oxygen Saturation % 90 %     2 Minute Liters of Oxygen 0 L     3 Minute Oxygen Saturation % 89 %     3 Minute Liters of Oxygen 0 L     4 Minute Oxygen Saturation % 88 %     4 Minute Liters of Oxygen 0 L     5 Minute Oxygen Saturation % 86 %     5 Minute Liters of Oxygen 0 L     6 Minute Oxygen Saturation % 87 %     6 Minute Liters of Oxygen 0 L     2 Minute Post Oxygen Saturation % 90 %     2 Minute Post Liters of Oxygen 112 L        Psychological, QOL, Others - Outcomes: PHQ 2/9: Depression screen [facility] 2/9 03/19/2016 03/04/2016 01/03/2016 12/12/2015 05/02/2015  Decreased Interest 0 0 0 0 0  Down, Depressed, Hopeless 0 0 0 0 0  PHQ - 2 Score 0 0 0 0 0    Quality of Life:     Quality of Life - 03/24/16 1443      Quality of Life Scores   Health/Function Post 24.9 %   Socioeconomic Post 25.06 %   Psych/Spiritual Post 25.93 %   Family Post 23.75 %   GLOBAL Post 25.01 %      Personal Goals: Goals established at orientation with interventions provided to work toward goal.    Personal Goals Discharge:     Goals and Risk Factor Review    Row Name 03/02/16  1311             Core Components/Risk Factors/Patient Goals Review   Personal Goals Review Increase Strength and Stamina;Improve shortness of breath with ADL's;Develop more efficient breathing techniques such as purse lipped breathing and diaphragmatic breathing and practicing self-pacing with activity.       Review Half way through program, progressing well, is exercising on his days away from pulmonary rehab, up to treadmill speed of 2.4 mph and a 3% incline, level 2 on rower, and level 6 on nustep.  BP improved.       Expected Outcomes Continue to increase workloads as tolerated.          Nutrition & Weight - Outcomes:    Nutrition:     Nutrition Therapy & Goals - 02/20/16 1510      Nutrition Therapy   Diet General, Healthful diet     Personal Nutrition Goals   Personal Goal #1 Maintain wt while in Pulmonary Rehab     Intervention Plan   Intervention Prescribe, educate and counsel regarding individualized specific dietary modifications aiming towards targeted core components such as weight, hypertension, lipid management, diabetes, heart failure and other comorbidities.   Expected Outcomes Short Term Goal: Understand basic principles of dietary content, such as calories, fat, sodium, cholesterol and nutrients.;Long Term Goal: Adherence to prescribed nutrition plan.      Nutrition Discharge:     Nutrition Assessments - 04/03/16 1322      Rate Your Plate Scores   Pre Score 56  Post Score 58      Education Questionnaire Score:     Knowledge Questionnaire Score - 03/24/16 1442      Knowledge Questionnaire Score   Post Score 11/13      Goals reviewed with patient; copy given to patient.

## 2016-04-09 NOTE — Addendum Note (Signed)
Encounter addended by: Lance Morin, RN on: 04/09/2016  8:13 AM<BR>    Actions taken: Episode resolved

## 2016-04-09 NOTE — Progress Notes (Signed)
Pulmonary Rehab Discharge Note  Phillip Jordan has graduated from the pulmonary rehab program as of 03/19/2017.  He was a pleasure to have in the program, progressed very well, and attended regularly.  He greatly increased his workloads and benefited from the diabetes education we provided in the program.  He will continue exercising independently at the [facility].

## 2016-04-12 NOTE — Progress Notes (Signed)
Patient ID: Phillip Jordan, male   DOB: 1929/10/21, 82 y.o.   MRN: [id number] 82 y.o. male with history of CAD who was admitted 10/14 with unstable angina. Found to have severe stenoses in the RCA and the proximal to mid LAD. The RCA stenosis was treated with a DES. The LAD had TIME-2 flow and a 95-99% lesion with plaque starting in the proximal vessel and extending into the mid vessel. The PCI of the LAD was staged.. Doing well  Not having chest pain.  Compliant with meds including plavix.    12/19/14  Abd [place] with aorta 2.5 cm  Going to [place] 3x/week and just finished pulmonary rehab for his COPD Stopped smoking in 1994  ROS: Denies fever, malais, weight loss, blurry vision, decreased visual acuity, cough, sputum, SOB, hemoptysis, pleuritic pain, palpitaitons, heartburn, abdominal pain, melena, lower extremity edema, claudication, or rash.  All other systems reviewed and negative  General: Affect appropriate Healthy:  appears stated age 82: normal Neck supple with no adenopathy JVP normal no bruits no thyromegaly Lungs clear with no wheezing and good diaphragmatic motion Heart:  S1/S2 no murmur, no rub, gallop or click PMI normal Abdomen: benighn, BS positve, no tenderness, no AAA no bruit.  No HSM or HJR Distal pulses intact with no bruits No edema Neuro non-focal Skin warm and dry No muscular weakness   Current Outpatient Prescriptions  Medication Sig Dispense Refill  . acetaminophen (TYLENOL) 500 MG tablet Take 1,000 mg by mouth every 6 (six) hours as needed for pain. Reported on 03/09/2016    . albuterol (PROAIR HFA) 108 (90 Base) MCG/ACT inhaler 2 puffs every 4 hours as needed only  if your can't catch your breath 1 Inhaler 6  . aspirin 81 MG tablet Take 1 tablet (81 mg total) by mouth daily.    . clopidogrel (PLAVIX) 75 MG tablet Take 1 tablet (75 mg total) by mouth daily. 90 tablet 2  . famotidine (PEPCID) 20 MG tablet One at bedtime 30 tablet 11  . glucose blood  (FREESTYLE TEST STRIPS) test strip Check blood sugar no more than twice daily 100 each 12  . isosorbide mononitrate (IMDUR) 30 MG 24 hr tablet Take 1 tablet (30 mg total) by mouth 2 (two) times daily. 180 tablet 2  . loratadine (CLARITIN) 10 MG tablet Take 10 mg by mouth daily as needed for allergies.     . metFORMIN (GLUCOPHAGE) 500 MG tablet Take 500 mg by mouth daily. With dinner    . Misc. Devices (ACAPELLA) MISC Use as directed 1 each 0  . mometasone-formoterol (DULERA) 200-5 MCG/ACT AERO Inhale 2 puffs into the lungs 2 (two) times daily. 1 Inhaler 0  . montelukast (SINGULAIR) 10 MG tablet Take 1 tablet (10 mg total) by mouth at bedtime. 90 tablet 2  . Multiple Vitamin (MULTIVITAMIN WITH MINERALS) TABS tablet Take 1 tablet by mouth daily.    . nitroGLYCERIN (NITROSTAT) 0.4 MG SL tablet Place 1 tablet (0.4 mg total) under the tongue every 5 (five) minutes as needed for chest pain (up to 3 doses only.). 25 tablet 3  . pantoprazole (PROTONIX) 40 MG tablet TAKE ONE TABLET BY MOUTH ONCE DAILY **TAKE  30-60  MINUTES  BEFORE  THE  FIRST  MEAL  OF  THE  DAY** 30 tablet 11  . pravastatin (PRAVACHOL) 40 MG tablet Take 1 tablet (40 mg total) by mouth daily. 90 tablet 2   No current facility-administered medications for this visit.  Allergies  Review of patient's allergies indicates not on file.  Electrocardiogram:   08/2013 SR RBBB possible old IMI  04/15/15  SR rate 67  PR 210 RBBB   Assessment and Plan CAD: .Stable with no angina and good activity level.  Continue medical Rx  HTN: Well controlled.  Continue current medications and low sodium Dash type diet.    AAA: upper normal 2.5 cm  No abdominal pain observe  DM:: Discussed low carb diet.  Target hemoglobin A1c is 6.5 or less.  Continue current medications.  Chol:  Cholesterol is at goal.  Continue current dose of statin and diet Rx.  No myalgias or side effects.  F/U  LFT's in 6 months. Lab Results  Component Value Date   LDLCALC  93 09/10/2015   COPD: no active wheezing continue Dulera f/u pulmonary         Jenkins Rouge

## 2016-04-14 ENCOUNTER — Encounter (HOSPITAL_COMMUNITY): Payer: PPO

## 2016-04-15 ENCOUNTER — Ambulatory Visit (INDEPENDENT_AMBULATORY_CARE_PROVIDER_SITE_OTHER): Payer: PPO | Admitting: Cardiovascular Disease

## 2016-04-15 ENCOUNTER — Encounter: Payer: Self-pay | Admitting: Cardiovascular Disease

## 2016-04-15 VITALS — BP 140/70 | HR 66 | Ht 69.0 in | Wt 170.0 lb

## 2016-04-15 DIAGNOSIS — I714 Abdominal aortic aneurysm, without rupture, unspecified: Secondary | ICD-10-CM

## 2016-04-15 DIAGNOSIS — I25810 Atherosclerosis of coronary artery bypass graft(s) without angina pectoris: Secondary | ICD-10-CM

## 2016-04-15 NOTE — Patient Instructions (Addendum)

## 2016-04-16 ENCOUNTER — Encounter (HOSPITAL_COMMUNITY): Payer: PPO

## 2016-04-21 ENCOUNTER — Encounter (HOSPITAL_COMMUNITY): Payer: PPO

## 2016-04-23 ENCOUNTER — Encounter (HOSPITAL_COMMUNITY): Payer: PPO

## 2016-04-28 ENCOUNTER — Encounter (HOSPITAL_COMMUNITY): Admission: RE | Admit: 2016-04-28 | Payer: PPO | Source: Ambulatory Visit

## 2016-04-30 ENCOUNTER — Encounter (HOSPITAL_COMMUNITY): Payer: PPO

## 2016-05-13 ENCOUNTER — Other Ambulatory Visit: Payer: Self-pay | Admitting: Internal Medicine

## 2016-05-21 ENCOUNTER — Encounter: Payer: Self-pay | Admitting: Internal Medicine

## 2016-05-21 ENCOUNTER — Encounter (INDEPENDENT_AMBULATORY_CARE_PROVIDER_SITE_OTHER): Payer: PPO | Admitting: Internal Medicine

## 2016-05-21 ENCOUNTER — Ambulatory Visit (INDEPENDENT_AMBULATORY_CARE_PROVIDER_SITE_OTHER): Payer: PPO | Admitting: Internal Medicine

## 2016-05-21 VITALS — BP 142/70 | HR 60 | Ht 68.0 in | Wt 172.0 lb

## 2016-05-21 DIAGNOSIS — J449 Chronic obstructive pulmonary disease, unspecified: Secondary | ICD-10-CM

## 2016-05-21 DIAGNOSIS — J328 Other chronic sinusitis: Secondary | ICD-10-CM | POA: Diagnosis not present

## 2016-05-21 LAB — PULMONARY FUNCTION TEST
DL/VA % PRED: 72 %
DL/VA: 3.22 ml/min/mmHg/L
DLCO COR: 17.14 ml/min/mmHg
DLCO UNC % PRED: 56 %
DLCO cor % pred: 57 %
DLCO unc: 16.84 ml/min/mmHg
FEF 25-75 PRE: 0.39 L/s
FEF 25-75 Post: 0.72 L/sec
FEF2575-%Change-Post: 85 %
FEF2575-%PRED-PRE: 24 %
FEF2575-%Pred-Post: 45 %
FEV1-%Change-Post: 32 %
FEV1-%PRED-PRE: 37 %
FEV1-%Pred-Post: 50 %
FEV1-POST: 1.23 L
FEV1-PRE: 0.93 L
FEV1FVC-%Change-Post: 7 %
FEV1FVC-%Pred-Pre: 58 %
FEV6-%CHANGE-POST: 24 %
FEV6-%PRED-POST: 79 %
FEV6-%PRED-PRE: 64 %
FEV6-POST: 2.6 L
FEV6-Pre: 2.09 L
FEV6FVC-%CHANGE-POST: 0 %
FEV6FVC-%PRED-POST: 103 %
FEV6FVC-%Pred-Pre: 102 %
FVC-%Change-Post: 23 %
FVC-%PRED-PRE: 62 %
FVC-%Pred-Post: 77 %
FVC-POST: 2.74 L
FVC-Pre: 2.22 L
POST FEV6/FVC RATIO: 95 %
PRE FEV1/FVC RATIO: 42 %
Post FEV1/FVC ratio: 45 %
Pre FEV6/FVC Ratio: 94 %
RV % PRED: 133 %
RV: 3.52 L
TLC % PRED: 98 %
TLC: 6.56 L

## 2016-05-21 NOTE — Patient Instructions (Signed)
If you are satisfied with your treatment plan,  let your doctor know and he/she can either refill your medications or you can return here when your prescription runs out.     If in any way you are not 100% satisfied,  please tell us.  If 100% better, tell your friends!  Pulmonary follow up is as needed   

## 2016-05-21 NOTE — Assessment & Plan Note (Signed)
Referred to ent 11/08/2015 >  Seen 11/27/15 by Redmond Baseman, rec conservative f/u prn as symptoms resolved prior to ov > f/u ent prn

## 2016-05-21 NOTE — Progress Notes (Signed)
Subjective:    Patient ID: Phillip Jordan, male    DOB: 1930-02-25   MRN: [id number]  Brief patient profile:  82  year old male, ex-smoker (1ppd x 30 years, quit 1990s), diagnosed with COPD per hx by Dr [name] (fev1 1.38L/44%, ratio 51 in Jan 2009). C/w GOLD III      05/25/2013 consult/ [name] re: sob/ prev eval MR with GOLD III COPD Chief Complaint  Patient presents with  . Pulmonary Consult    Referred per Dr. Kathlene November. The pt c/o DOE for the past 15 years, but worse for the past 6 months. He states that he gets SOB with walking up hills, but does okay with his exercise program 2-3 times per wk. He also c/o hoarseness and cough "for a long time"- cough is prod in the am with minimal clear sputum.      All his days are about the same in terms of activity tolerance  Breathing got better p quit smoking then worse again starting around mid 1990s Better on dulera, no better on spiriva ? Worse hoarseness so stopped it Work on inhaler technique:    Pantoprazole (protonix) 40 mg   Take 30-60 min before first meal of the day and Pepcid 20 mg one bedtime until return to office - this is the best way to tell whether stomach acid is contributing to your problem.  GERD diet   07/05/2013 f/u ov/Kharson Rasmusson re: COPD GOLD III Chief Complaint  Patient presents with  . Followup with PFT    Pt states that overall his SOB and cough have improved since the last visit. He denies any new co's today.    No longer needing much albuterol at all and able to tol desired activities s tendency to aecopd but still "not as good as I get on prednisone" rec Increase dulera to 200 Take 2 puffs first thing in am and then another 2 puffs about 12 hours later.  Work on inhaler technique:  If you like the dulera 200 better after 2 weeks fill the rx> done   10/03/2013 f/u ov/Rossana Molchan re: COPD GOLD III Chief Complaint  Patient presents with  . Follow-up    Breathing is fine. dry cough d/t PND.   cough tends to be early in am but  does not wake him up  Has clariton not using at all  Not limited from desired activities by breathing rec Continue dulera 200 Take 2 puffs first thing in am and then another 2 puffs about 12 hours later.  Only use your albuterol (proair/RED) as a rescue medication   If hoarseness worsen you will need a spacer device  If hoarseness ok then you can try to wean the acid suppression per Dr [name]  Try clariton for the dripping nose.   ED eval 08/28/15 c/w copd exac/ viral uri    10/08/2015  Ext f/u ov/Corey Laski re: p er eval/ transition - GOLD III copd/ maint on dulera 200 / [place] saba  Chief Complaint  Patient presents with  . COPD    Last seen in 2015. Reports increased SOB, chest tightness, wheezing and dry coughing.   baseline using dulera 200 2bid avg use saba 2-3 month Worse since oct 2016 increase need for ventolin before ex but also sensation p supper frequently strangling when watching TV x 6 month No question improves p ventolin if uses it before ex  rec GERD  Diet  Pantoprazole (protonix) 40 mg   Take  30-60  min before first meal of the day and Pepcid (famotidine)  20 mg one @  bedtime until return to office - this is the best way to tell whether stomach acid is contributing to your problem.     10/11/15  Phone care:   Prednisone 10 mg take  4 each am x 2 days,   2 each am x 2 days,  1 each am x 2 days and stop / zpak    10/21/2015 acute extended ov/Cloy Cozzens re: copd III maint on dulera 200 2bid/ singulair /prn saba  Chief Complaint  Patient presents with  . Acute Visit    Finished antibiotics last Tueday felt good at first then by Friday started feeling bad again. Prod Coughing with white with some green mucus, wheezing, SOB worse with exertion. Denies any cp/tightness.    better from rx 2/22 then sick again 2/24 assoc with sensation of strangling/ choking at hs  And increase need for saba  rec Levaquin 500 mg daily x 7 days Prednisone 10 mg take  4 each am x 2 days,   2 each  am x 2 days,  1 each am x 2 days and stop  For cough > mucinex dm 1200 mg every 12 hours and suppelement with tramadol 50 mg one every 4 hours if needed For Breathing Only use your albuterol as a rescue medication      10/25/2015 acute ext ov/Jocsan Mcginley re: flare of copd really started in Oct 2016 maint on dulera 200/singulair / rare saba   Chief Complaint  Patient presents with  . Acute Visit    Pt states cough is not improving- still prod with clear to green sputum.  He states that his breathing has worsened  and feels very weak.   cough is worse at hs and in am/ mucus still green on levquin, assoc with nasal congestion and subjective wheeze  rec For cough > mucinex dm 1200 mg every 12 hours use flutter valve and suppelement with tramadol 50 mg one every 4 hours if needed For Breathing: Only use your albuterol as a rescue medication If not better by time you finish levaquin> Augmentin 875 mg take one pill twice daily  X 10 days - Prednisone 10 mg take  4 each am x 2 days,   2 each am x 2 days,  1 each am x 2 days and stop    11/08/2015 acute extended ov/Sedalia Greeson re: ? Sinobronchial asthma maint on dulera 200 2bid  Chief Complaint  Patient presents with  . Acute Visit    results of CT scan that was done yesterday; coughing has stopped; clear discharge from nasal passage; sob  sleep well but bad am cough/congestion each day, really not free of this symptom since Oct 2016 and previously some better on abx but not now  Doe = MMRC2 = can't walk a nl pace on a flat grade s sob rec Please see patient coordinator before you leave today  to schedule ent eval  Plan A = Automatic = dulera 200 Take 2 puffs first thing in am and then another 2 puffs about 12 hours later.  Plan B = Backup Only use your albuterol(ventolin/proair) For cough > mucinex dm up to 1200 mg every 12 hours and use the flutter valve as much as possible     05/21/2016  f/u ov/Jodee Wagenaar re:  GOLD III copd with marked reversibility/ dulera  200 2bid and singulair daily  Chief Complaint  Patient presents with  .  Follow-up    PFT's done today. Breathing is doing well and he denies any new co's today.   does treadmill once a week 58mph x 3 degrees x 20 m s stopping  Which has improved since did rehab     No obvious day to day or daytime variabilty or assoc  excess/ purulent sputum or mucus plugs  or cp or   overt  hb symptoms. No unusual exp hx or h/o childhood pna/ asthma or knowledge of premature birth.  Also denies any obvious fluctuation of symptoms with weather or environmental changes or other aggravating or alleviating factors except as outlined above   Current Medications, Allergies, Complete Past Medical History, Past Surgical History, Family History, and Social History were reviewed in [employer] record.  ROS  The following are not active complaints unless bolded sore throat, dysphagia, dental problems, itching, sneezing,  nasal congestion or excess/ purulent secretions, ear ache,   fever, chills, sweats, unintended wt loss, pleuritic or exertional cp, hemoptysis,  orthopnea pnd or leg swelling, presyncope, palpitations, heartburn, abdominal pain, anorexia, nausea, vomiting, diarrhea  or change in bowel or urinary habits, change in stools or urine, dysuria,hematuria,  rash, arthralgias, visual complaints, headache, numbness weakness or ataxia or problems with walking or coordination,  change in mood/affect or memory.          Objective:   Physical Exam  amb robust wm nad mod hoarse   10/03/2013        175  > 10/08/2015  176 >  10/21/2015 173 > 10/25/2015   173 > 11/08/2015 170 > 05/21/2016  172  07/05/13 176 lb (79.833 kg)  06/26/13 173 lb 12.8 oz (78.835 kg)  06/16/13 174 lb (78.926 kg)        HEENT: nl dentition  and oropharynx. Nl external ear canals without cough reflex- mucopurulent discharge both turnbinates R> L   NECK :  without JVD/Nodes/TM/ nl carotid upstrokes bilaterally   LUNGS:  no acc muscle use,  slt barrel contour chest with distant bs/ no wheeze p saba for pfts   CV:  RRR  no s3 or murmur or increase in P2, no edema   ABD:  soft and nontender with nl inspiratory excursion in the supine position. No bruits or organomegaly, bowel sounds nl  MS:  Nl gait/ ext warm without deformities, calf tenderness, cyanosis or clubbing No obvious joint restrictions   SKIN: warm and dry without lesions    NEURO:  alert, approp, nl sensorium with  no motor deficits             Assessment & Plan:   Outpatient Encounter Prescriptions as of 05/21/2016  Medication Sig  . acetaminophen (TYLENOL) 500 MG tablet Take 1,000 mg by mouth every 6 (six) hours as needed for pain. Reported on 03/09/2016  . albuterol (PROAIR HFA) 108 (90 Base) MCG/ACT inhaler 2 puffs every 4 hours as needed only  if your can't catch your breath  . aspirin 81 MG tablet Take 1 tablet (81 mg total) by mouth daily.  . clopidogrel (PLAVIX) 75 MG tablet Take 1 tablet (75 mg total) by mouth daily.  . Cyanocobalamin (ALBERTSONS VITAMIN B-12 PO) Take 1 tablet by mouth daily.  . famotidine (PEPCID) 20 MG tablet One at bedtime  . glucose blood (FREESTYLE TEST STRIPS) test strip Check blood sugar no more than twice daily  . isosorbide mononitrate (IMDUR) 30 MG 24 hr tablet Take 1 tablet (30 mg total) by mouth 2 (two)  times daily.  [name] loratadine (CLARITIN) 10 MG tablet Take 10 mg by mouth daily as needed for allergies.   . metFORMIN (GLUCOPHAGE) 500 MG tablet Take 1 tablet (500 mg total) by mouth daily with supper.  . Misc. Devices (ACAPELLA) MISC Use as directed  . mometasone-formoterol (DULERA) 200-5 MCG/ACT AERO Inhale 2 puffs into the lungs 2 (two) times daily.  . montelukast (SINGULAIR) 10 MG tablet Take 1 tablet (10 mg total) by mouth at bedtime.  . Multiple Vitamin (MULTIVITAMIN WITH MINERALS) TABS tablet Take 1 tablet by mouth daily.  . nitroGLYCERIN (NITROSTAT) 0.4 MG SL tablet Place 1 tablet (0.4 mg total)  under the tongue every 5 (five) minutes as needed for chest pain (up to 3 doses only.).  [name] pantoprazole (PROTONIX) 40 MG tablet TAKE ONE TABLET BY MOUTH ONCE DAILY **TAKE  30-60  MINUTES  BEFORE  THE  FIRST  MEAL  OF  THE  DAY**  . pravastatin (PRAVACHOL) 40 MG tablet Take 1 tablet (40 mg total) by mouth daily.   No facility-administered encounter medications on file as of 05/21/2016.

## 2016-05-21 NOTE — Assessment & Plan Note (Signed)
-   PFT's 07/02/11  FEV1  1.41 (55%) ratio 66 and 12% better p B2 with DLCO 96% - PFTs  07/05/2013 FEV1  1.05 and 1.31 p B2 (24%) with DLCO 59 corrects to 84%  - Spiriva and other mdi result in hoarseness - hfa 05/25/2013 75% p coaching>  90% 07/05/13  - added gerd rx 05/25/2013 >> improved 07/05/13  - rx dulera 200 2bid 07/05/13 > better 10/03/2013   - 10/08/2015 added GERD rx back due to pm strangling/ hoarseness  - 10/25/2015 added flutter - 10/25/2015  extensive coaching HFA effectiveness =    90%  - Rehab completed Aug 2017  - PFT's  05/21/2016  FEV1 1.23 (50 % ) ratio 45  p 32 % improvement from saba p dulera 200 prior to study with DLCO  56/57 % corrects to 72  % for alv volume      I had an extended final summary discussion with the patient reviewing all relevant studies completed to date and  lasting 15 to 20 minutes of a 25 minute visit on the following issues:    He's is doing remarkably well for a GOLD III copd pt and def benefitted from rehab  Presently  An MMRC1 = can walk nl pace, flat grade, can't hurry or go uphills or steps s sob  And no flares on rx directed at the asthmatic/bronchitic component with dulera 200 and singulair  Each maintenance medication was reviewed in detail including most importantly the difference between maintenance and as needed and under what circumstances the prns are to be used.  Please see instructions for details which were reviewed in writing and the patient given a copy.   Pulmonary f/u is prn

## 2016-06-08 DIAGNOSIS — E113291 Type 2 diabetes mellitus with mild nonproliferative diabetic retinopathy without macular edema, right eye: Secondary | ICD-10-CM | POA: Diagnosis not present

## 2016-06-08 DIAGNOSIS — Z961 Presence of intraocular lens: Secondary | ICD-10-CM | POA: Diagnosis not present

## 2016-06-08 DIAGNOSIS — H524 Presbyopia: Secondary | ICD-10-CM | POA: Diagnosis not present

## 2016-06-08 LAB — HM DIABETES EYE EXAM

## 2016-06-10 ENCOUNTER — Encounter: Payer: Self-pay | Admitting: Internal Medicine

## 2016-07-06 ENCOUNTER — Ambulatory Visit (INDEPENDENT_AMBULATORY_CARE_PROVIDER_SITE_OTHER): Payer: PPO | Admitting: Internal Medicine

## 2016-07-06 ENCOUNTER — Encounter: Payer: Self-pay | Admitting: Internal Medicine

## 2016-07-06 VITALS — BP 142/78 | HR 64 | Temp 97.4°F | Resp 14 | Ht 68.0 in | Wt 175.5 lb

## 2016-07-06 DIAGNOSIS — J449 Chronic obstructive pulmonary disease, unspecified: Secondary | ICD-10-CM | POA: Diagnosis not present

## 2016-07-06 DIAGNOSIS — Z23 Encounter for immunization: Secondary | ICD-10-CM

## 2016-07-06 DIAGNOSIS — F4320 Adjustment disorder, unspecified: Secondary | ICD-10-CM | POA: Diagnosis not present

## 2016-07-06 DIAGNOSIS — F4321 Adjustment disorder with depressed mood: Secondary | ICD-10-CM

## 2016-07-06 DIAGNOSIS — E11319 Type 2 diabetes mellitus with unspecified diabetic retinopathy without macular edema: Secondary | ICD-10-CM | POA: Diagnosis not present

## 2016-07-06 DIAGNOSIS — R1031 Right lower quadrant pain: Secondary | ICD-10-CM | POA: Diagnosis not present

## 2016-07-06 LAB — MICROALBUMIN / CREATININE URINE RATIO
Creatinine,U: 60.1 mg/dL
Microalb Creat Ratio: 4.7 mg/g (ref 0.0–30.0)
Microalb, Ur: 2.8 mg/dL — ABNORMAL HIGH (ref 0.0–1.9)

## 2016-07-06 LAB — BASIC METABOLIC PANEL
BUN: 14 mg/dL (ref 6–23)
CO2: 32 meq/L (ref 19–32)
Calcium: 9.9 mg/dL (ref 8.4–10.5)
Chloride: 98 mEq/L (ref 96–112)
Creatinine, Ser: 0.95 mg/dL (ref 0.40–1.50)
GFR: 80.25 mL/min (ref >60.00)
GLUCOSE: 105 mg/dL — AB (ref 70–99)
Potassium: 4.6 mEq/L (ref 3.5–5.1)
SODIUM: 136 meq/L (ref 135–145)

## 2016-07-06 LAB — HEMOGLOBIN A1C: Hgb A1c MFr Bld: 6.5 % (ref 4.6–6.5)

## 2016-07-06 NOTE — Patient Instructions (Signed)
GO TO THE LAB : Get the blood work     GO TO THE FRONT DESK Schedule your next appointment for a physical exam in 3 months. Fasting.

## 2016-07-06 NOTE — Progress Notes (Signed)
Pre visit review using our clinic review tool, if applicable. No additional management support is needed unless otherwise documented below in the visit note. 

## 2016-07-06 NOTE — Assessment & Plan Note (Signed)
Grieving,lost his  grandson yesterday, patient is counseled to the best of my ability. He has a strong faith, denies current suicidal ideas. He knows to call me if he ever feels need help. DM, retinopathy, neuropathy: Continue metformin, check BMP, A1c and microalbumin. COPD: Denies sx, states he had a flu shot at pulmonology (no documentation) R Groin pain: History of cardiac catheterization via right groin, he has a bruit but no mass. Will check ultrasound Primary  care- Td today RTC 3 months, CPX

## 2016-07-06 NOTE — Progress Notes (Addendum)
Subjective:    Patient ID: Phillip Jordan, male    DOB: Nov 17, 1929, 82 y.o.   MRN: [id number]  DOS:  07/06/2016 Type of visit - description : rov Interval history: Here for a routine checkup however he lost his grandson yesterday, he committed suicide, obviously affected by the issue. Denies any suicidal thoughts but he is grieving. Also 4 weeks history of pain at the right groin, near the place they did a cardiac catheterization. Slightly bulge? We review his labs and medications. Good compliance without apparent side effects..    Review of Systems No testicular swelling No rash No leg swelling or claudication.  Past Medical History:  Diagnosis Date  . Allergic rhinitis   . Aortic dissection ([place])    f/u by cards - AAA with distal dissection.  [name] CAD (coronary artery disease)    a. s/p MI 1992. b. Prior hx stenting to RCA/Cx. b. 05/2013: [place] s/p DES to distal RCA then staged DES to prox LAD.   [name] COPD with asthma ([place])   . Diabetes mellitus   . Hyperlipidemia   . Lichen planus    Right midline inferior chest, Dr. [name] Sensing  . Shingles     Past Surgical History:  Procedure Laterality Date  . BACK SURGERY  1993  . CARDIAC CATHETERIZATION     left, w/ coronary angiography and left ventriculograpy  . LEFT HEART CATHETERIZATION WITH CORONARY ANGIOGRAM N/A 06/09/2013   Procedure: LEFT HEART CATHETERIZATION WITH CORONARY ANGIOGRAM;  Surgeon: [name], MD;  Location: [facility] CATH LAB;  Service: Cardiovascular;  Laterality: N/A;  . PERCUTANEOUS CORONARY STENT INTERVENTION (PCI-S) N/A 06/12/2013   Procedure: PERCUTANEOUS CORONARY STENT INTERVENTION (PCI-S);  Surgeon: [name], MD;  Location: [facility] CATH LAB;  Service: Cardiovascular;  Laterality: N/A;  prox LAD  . PTCA     w/ placement of drugeluting stent in the distal right coronary artery, and distal Cardiologist: [name]    Social History   Social History  . Marital status: Married    Spouse name:  N/A  . Number of children: 3  . Years of education: N/A   Occupational History  . retired    Social History Main Topics  . Smoking status: Former Smoker    Packs/day: 1.50    Years: 39.00    Types: Cigarettes    Quit date: 08/24/1992  . Smokeless tobacco: Never Used  . Alcohol use No  . Drug use: No  . Sexual activity: Not on file   Other Topics Concern  . Not on file   Social History Narrative   Lives w/ wife, does do some  local mission work  .   3 kids, 11 Gk-GGK                     Medication List       Accurate as of 07/06/16  4:28 PM. Always use your most recent med list.          ACAPELLA Misc Use as directed   acetaminophen 500 MG tablet Commonly known as:  TYLENOL Take 1,000 mg by mouth every 6 (six) hours as needed for pain. Reported on 03/09/2016   albuterol 108 (90 Base) MCG/ACT inhaler Commonly known as:  PROAIR HFA 2 puffs every 4 hours as needed only  if your can't catch your breath   aspirin 81 MG tablet Take 1 tablet (81 mg total) by mouth daily.   clopidogrel 75 MG tablet Commonly known  as:  PLAVIX Take 1 tablet (75 mg total) by mouth daily.   famotidine 20 MG tablet Commonly known as:  PEPCID One at bedtime   glucose blood test strip Commonly known as:  FREESTYLE TEST STRIPS Check blood sugar no more than twice daily   isosorbide mononitrate 30 MG 24 hr tablet Commonly known as:  IMDUR Take 1 tablet (30 mg total) by mouth 2 (two) times daily.   loratadine 10 MG tablet Commonly known as:  CLARITIN Take 10 mg by mouth daily as needed for allergies.   metFORMIN 500 MG tablet Commonly known as:  GLUCOPHAGE Take 1 tablet (500 mg total) by mouth daily with supper.   mometasone-formoterol 200-5 MCG/ACT Aero Commonly known as:  DULERA Inhale 2 puffs into the lungs 2 (two) times daily.   montelukast 10 MG tablet Commonly known as:  SINGULAIR Take 1 tablet (10 mg total) by mouth at bedtime.   multivitamin capsule Take 1  capsule by mouth daily.   multivitamin with minerals Tabs tablet Take 1 tablet by mouth daily.   nitroGLYCERIN 0.4 MG SL tablet Commonly known as:  NITROSTAT Place 1 tablet (0.4 mg total) under the tongue every 5 (five) minutes as needed for chest pain (up to 3 doses only.).   pantoprazole 40 MG tablet Commonly known as:  PROTONIX TAKE ONE TABLET BY MOUTH ONCE DAILY **TAKE  30-60  MINUTES  BEFORE  THE  FIRST  MEAL  OF  THE  DAY**   pravastatin 40 MG tablet Commonly known as:  PRAVACHOL Take 1 tablet (40 mg total) by mouth daily.   vitamin B-12 1000 MCG tablet Commonly known as:  CYANOCOBALAMIN Take 1,000 mcg by mouth daily.          Objective:   Physical Exam  Genitourinary:      BP (!) 142/78 (BP Location: Left Arm, Patient Position: Sitting, Cuff Size: Normal)   Pulse 64   Temp 97.4 F (36.3 C) (Oral)   Resp 14   Ht 5\' 8"  (1.727 m)   Wt 175 lb 8 oz (79.6 kg)   SpO2 96%   BMI 26.68 kg/m  General:   Well developed, well nourished . NAD.  HEENT:  Normocephalic . Face symmetric, atraumatic Lungs:  CTA B, few dry crackles at bases Normal respiratory effort, no intercostal retractions, no accessory muscle use. Heart: RRR,  no murmur.  no pretibial edema bilaterally . Normal pedal pulses. Right femoral artery + bruit. No bruit on the left. Abdomen:  Not distended, soft, non-tender. No rebound or rigidity. No mass or bruit. Skin: Not pale. Not jaundice Neurologic:  alert & oriented X3.  Speech normal, gait appropriate for age and unassisted Psych--  Cognition and judgment appear intact.  Cooperative with normal attention span and concentration.  Behavior appropriate. Tearful when we talk about his grandson.    Assessment & Plan:    Assessment> Diabetes, + retinopathy, + neuropathy Hyperlipidemia CV --CAD --AAA infrarenal  last [place] 11/2014, stable; + B iliac stenosis >50%, next 2years --RBBB COPD and asthma (on GERD meds)--- Dr [name] H/o  Shingles Lichen planus Dr. Ronnald Ramp Chronic hoarseness, status post ENT eval ~ 2012 Normal DEXA 2012  PLAN Grieving,lost his  grandson yesterday, patient is counseled to the best of my ability. He has a strong faith, denies current suicidal ideas. He knows to call me if he ever feels need help. DM, retinopathy, neuropathy: Continue metformin, check BMP, A1c and microalbumin. COPD: Denies sx, states he had a flu  shot at pulmonology (no documentation) R Groin pain: History of cardiac catheterization via right groin, he has a bruit but no mass. Will check ultrasound Primary  care- Td today RTC 3 months, CPX

## 2016-07-07 ENCOUNTER — Encounter: Payer: Self-pay | Admitting: Podiatry

## 2016-07-07 ENCOUNTER — Ambulatory Visit (INDEPENDENT_AMBULATORY_CARE_PROVIDER_SITE_OTHER): Payer: PPO | Admitting: Podiatry

## 2016-07-07 VITALS — Ht 67.0 in | Wt 175.0 lb

## 2016-07-07 DIAGNOSIS — M79676 Pain in unspecified toe(s): Secondary | ICD-10-CM

## 2016-07-07 DIAGNOSIS — B351 Tinea unguium: Secondary | ICD-10-CM

## 2016-07-07 DIAGNOSIS — Q828 Other specified congenital malformations of skin: Secondary | ICD-10-CM | POA: Diagnosis not present

## 2016-07-07 DIAGNOSIS — E119 Type 2 diabetes mellitus without complications: Secondary | ICD-10-CM

## 2016-07-07 NOTE — Progress Notes (Signed)
Patient ID: Phillip Jordan, male   DOB: 12-Sep-1929, 82 y.o.   MRN: [id number] Complaint:  Visit Type: Patient returns to my office for continued preventative foot care services. Complaint: Patient states" my nails have grown long and thick and become painful to walk and wear shoes" Patient has been diagnosed with DM with no foot complications. The patient presents for preventative foot care services. No changes to ROS.  He nhas been under treatment for COPD.  Patient has callus under the ball of right foot.  Podiatric Exam: Vascular: dorsalis pedis and posterior tibial pulses are palpable bilateral. Capillary return is immediate. Temperature gradient is WNL. Skin turgor WNL  Sensorium: Normal Semmes Weinstein monofilament test except toes both feet.. Normal tactile sensation bilaterally. Nail Exam: Pt has thick disfigured discolored nails with subungual debris noted bilateral entire nail hallux through fifth toenails Ulcer Exam: There is no evidence of ulcer or pre-ulcerative changes or infection. Orthopedic Exam: Muscle tone and strength are WNL. No limitations in general ROM. No crepitus or effusions noted. Foot type and digits show no abnormalities. Bony prominences are unremarkable. Skin: Porokeratosis sub 1 right foot. Hemmorhagic porokeratosis noted. No infection or ulcers  Diagnosis:  Onychomycosis, , Pain in right toe, pain in left toes Porokeratosis right foot   Treatment & Plan Procedures and Treatment: Consent by patient was obtained for treatment procedures. The patient understood the discussion of treatment and procedures well. All questions were answered thoroughly reviewed. Debridement of mycotic and hypertrophic toenails, 1 through 5 bilateral and clearing of subungual debris. No ulceration, no infection noted. Debride porokeratosis right foot. Return Visit-Office Procedure: Patient instructed to return to the office for a follow up visit 3 months for continued evaluation and  treatment.

## 2016-07-09 ENCOUNTER — Ambulatory Visit (HOSPITAL_COMMUNITY)
Admission: RE | Admit: 2016-07-09 | Discharge: 2016-07-09 | Disposition: A | Discharge status: Home / Self Care | Payer: PPO | Source: Ambulatory Visit | Attending: Cardiology | Admitting: Cardiology

## 2016-07-09 DIAGNOSIS — R1031 Right lower quadrant pain: Secondary | ICD-10-CM | POA: Insufficient documentation

## 2016-07-13 ENCOUNTER — Ambulatory Visit (INDEPENDENT_AMBULATORY_CARE_PROVIDER_SITE_OTHER)
Admission: RE | Admit: 2016-07-13 | Discharge: 2016-07-13 | Disposition: A | Discharge status: Home / Self Care | Payer: PPO | Source: Ambulatory Visit | Attending: Internal Medicine | Admitting: Internal Medicine

## 2016-07-13 ENCOUNTER — Encounter: Payer: Self-pay | Admitting: Internal Medicine

## 2016-07-13 ENCOUNTER — Ambulatory Visit (INDEPENDENT_AMBULATORY_CARE_PROVIDER_SITE_OTHER): Payer: PPO | Admitting: Internal Medicine

## 2016-07-13 VITALS — BP 142/80 | HR 69 | Ht 69.0 in | Wt 176.4 lb

## 2016-07-13 DIAGNOSIS — J449 Chronic obstructive pulmonary disease, unspecified: Secondary | ICD-10-CM

## 2016-07-13 DIAGNOSIS — J441 Chronic obstructive pulmonary disease with (acute) exacerbation: Secondary | ICD-10-CM

## 2016-07-13 DIAGNOSIS — R0602 Shortness of breath: Secondary | ICD-10-CM | POA: Diagnosis not present

## 2016-07-13 MED ORDER — AZITHROMYCIN 250 MG PO TABS: ORAL_TABLET | ORAL | 0 refills | Status: DC

## 2016-07-13 MED ORDER — PREDNISONE 10 MG PO TABS: ORAL_TABLET | ORAL | 0 refills | Status: DC

## 2016-07-13 NOTE — Patient Instructions (Addendum)
Change the protonix to Take 30- 60 min before your first and last meals of the day until no cough then ok to just take once a day 30 min before first meal  Work on inhaler technique:  relax and gently blow all the way out then take a nice smooth deep breath back in, triggering the inhaler at same time you start breathing in.  Hold for up to 5 seconds if you can. Blow out thru nose. Rinse and gargle with water when done   zpak Prednisone 10 mg take  4 each am x 2 days,   2 each am x 2 days,  1 each am x 2 days and stop   Please remember to go to the lab and x-ray department downstairs for your tests - we will call you with the results when they are available.  Follow up is as needed

## 2016-07-13 NOTE — Progress Notes (Signed)
Subjective:     Patient ID: Phillip Jordan, male    DOB: 08-Nov-1929   MRN: [id number]    Brief patient profile:  82  year old male, ex-smoker (1ppd x 30 years, quit 1990s), diagnosed with COPD per hx by Dr [name] (fev1 1.38L/44%, ratio 51 in Jan 2009). C/w GOLD III      05/25/2013 consult/ [name] re: sob/ prev eval MR with GOLD III COPD Chief Complaint  Patient presents with  . Pulmonary Consult    Referred per Dr. Kathlene November. The pt c/o DOE for the past 15 years, but worse for the past 6 months. He states that he gets SOB with walking up hills, but does okay with his exercise program 2-3 times per wk. He also c/o hoarseness and cough "for a long time"- cough is prod in the am with minimal clear sputum.      All his days are about the same in terms of activity tolerance  Breathing got better p quit smoking then worse again starting around mid 1990s Better on dulera, no better on spiriva ? Worse hoarseness so stopped it Work on inhaler technique:    Pantoprazole (protonix) 40 mg   Take 30-60 min before first meal of the day and Pepcid 20 mg one bedtime until return to office - this is the best way to tell whether stomach acid is contributing to your problem.  GERD diet   07/05/2013 f/u ov/Kennan Detter re: COPD GOLD III Chief Complaint  Patient presents with  . Followup with PFT    Pt states that overall his SOB and cough have improved since the last visit. He denies any new co's today.    No longer needing much albuterol at all and able to tol desired activities s tendency to aecopd but still "not as good as I get on prednisone" rec Increase dulera to 200 Take 2 puffs first thing in am and then another 2 puffs about 12 hours later.  Work on inhaler technique:  If you like the dulera 200 better after 2 weeks fill the rx> done   10/03/2013 f/u ov/Chellsie Gomer re: COPD GOLD III Chief Complaint  Patient presents with  . Follow-up    Breathing is fine. dry cough d/t PND.   cough tends to be early in  am but does not wake him up  Has clariton not using at all  Not limited from desired activities by breathing rec Continue dulera 200 Take 2 puffs first thing in am and then another 2 puffs about 12 hours later.  Only use your albuterol (proair/RED) as a rescue medication   If hoarseness worsen you will need a spacer device  If hoarseness ok then you can try to wean the acid suppression per Dr [name]  Try clariton for the dripping nose.   ED eval 08/28/15 c/w copd exac/ viral uri    10/08/2015  Ext f/u ov/Ariyan Sinnett re: p er eval/ transition - GOLD III copd/ maint on dulera 200 / [place] saba  Chief Complaint  Patient presents with  . COPD    Last seen in 2015. Reports increased SOB, chest tightness, wheezing and dry coughing.   baseline using dulera 200 2bid avg use saba 2-3 month Worse since oct 2016 increase need for ventolin before ex but also sensation p supper frequently strangling when watching TV x 6 month No question improves p ventolin if uses it before ex  rec GERD  Diet  Pantoprazole (protonix) 40 mg  Take  30-60 min before first meal of the day and Pepcid (famotidine)  20 mg one @  bedtime until return to office - this is the best way to tell whether stomach acid is contributing to your problem.     10/11/15  Phone care:   Prednisone 10 mg take  4 each am x 2 days,   2 each am x 2 days,  1 each am x 2 days and stop / zpak    10/21/2015 acute extended ov/Allesha Aronoff re: copd III maint on dulera 200 2bid/ singulair /prn saba  Chief Complaint  Patient presents with  . Acute Visit    Finished antibiotics last Tueday felt good at first then by Friday started feeling bad again. Prod Coughing with white with some green mucus, wheezing, SOB worse with exertion. Denies any cp/tightness.    better from rx 2/22 then sick again 2/24 assoc with sensation of strangling/ choking at hs  And increase need for saba  rec Levaquin 500 mg daily x 7 days Prednisone 10 mg take  4 each am x 2 days,   2  each am x 2 days,  1 each am x 2 days and stop  For cough > mucinex dm 1200 mg every 12 hours and suppelement with tramadol 50 mg one every 4 hours if needed For Breathing Only use your albuterol as a rescue medication      10/25/2015 acute ext ov/Ryosuke Ericksen re: flare of copd really started in Oct 2016 maint on dulera 200/singulair / rare saba   Chief Complaint  Patient presents with  . Acute Visit    Pt states cough is not improving- still prod with clear to green sputum.  He states that his breathing has worsened  and feels very weak.   cough is worse at hs and in am/ mucus still green on levquin, assoc with nasal congestion and subjective wheeze  rec For cough > mucinex dm 1200 mg every 12 hours use flutter valve and suppelement with tramadol 50 mg one every 4 hours if needed For Breathing: Only use your albuterol as a rescue medication If not better by time you finish levaquin> Augmentin 875 mg take one pill twice daily  X 10 days - Prednisone 10 mg take  4 each am x 2 days,   2 each am x 2 days,  1 each am x 2 days and stop    11/08/2015 acute extended ov/Ozie Lupe re: ? Sinobronchial asthma maint on dulera 200 2bid  Chief Complaint  Patient presents with  . Acute Visit    results of CT scan that was done yesterday; coughing has stopped; clear discharge from nasal passage; sob  sleep well but bad am cough/congestion each day, really not free of this symptom since Oct 2016 and previously some better on abx but not now  Doe = MMRC2 = can't walk a nl pace on a flat grade s sob rec Please see patient coordinator before you leave today  to schedule ent eval  Plan A = Automatic = dulera 200 Take 2 puffs first thing in am and then another 2 puffs about 12 hours later.  Plan B = Backup Only use your albuterol(ventolin/proair) For cough > mucinex dm up to 1200 mg every 12 hours and use the flutter valve as much as possible     05/21/2016  f/u ov/Aerika Groll re:  GOLD III copd with marked reversibility/  dulera 200 2bid and singulair daily  Chief Complaint  Patient presents  with  . Follow-up    PFT's done today. Breathing is doing well and he denies any new co's today.   does treadmill once a week 67mph x 3 degrees x 20 m s stopping  Which has improved since did rehab  rec F/u can be as needed   07/13/2016 acute extended ov/Charisa Twitty re: acute flare despite maint with dulera 200 / singulair  Chief Complaint  Patient presents with  . Acute Visit    Pt c/o sore throat, PND, wheezing and SOB x 4 days. Pt states that his sore throat has improved. Pt notes some imporvement in his cough.     Has not really needed saba much over baseline/ symptoms appear to be improving s specific rx      No obvious day to day or daytime variabilty or assoc  excess/ purulent sputum or mucus plugs  or cp or   overt  hb symptoms. No unusual exp hx or h/o childhood pna/ asthma or knowledge of premature birth.  Also denies any obvious fluctuation of symptoms with weather or environmental changes or other aggravating or alleviating factors except as outlined above   Current Medications, Allergies, Complete Past Medical History, Past Surgical History, Family History, and Social History were reviewed in [employer] record.  ROS  The following are not active complaints unless bolded sore throat, dysphagia, dental problems, itching, sneezing,  nasal congestion or excess/ purulent secretions, ear ache,   fever, chills, sweats, unintended wt loss, pleuritic or exertional cp, hemoptysis,  orthopnea pnd or leg swelling, presyncope, palpitations, heartburn, abdominal pain, anorexia, nausea, vomiting, diarrhea  or change in bowel or urinary habits, change in stools or urine, dysuria,hematuria,  rash, arthralgias, visual complaints, headache, numbness weakness or ataxia or problems with walking or coordination,  change in mood/affect or memory.          Objective:   Physical Exam  amb robust wm nad mod  hoarse - vital signs reviewed .- Note on arrival 02 sats  96% on RA    10/03/2013        175  > 10/08/2015  176 >  10/21/2015 173 > 10/25/2015   173 > 11/08/2015 170 > 05/21/2016  172 > 07/13/2016 176  07/05/13 176 lb (79.833 kg)  06/26/13 173 lb 12.8 oz (78.835 kg)  06/16/13 174 lb (78.926 kg)        HEENT: nl dentition  and oropharynx. Nl external ear canals without cough reflex- mucopurulent discharge both turnbinates R> L   NECK :  without JVD/Nodes/TM/ nl carotid upstrokes bilaterally   LUNGS: no acc muscle use,  slt barrel contour chest with distant bs/ no wheeze p saba for pfts   CV:  RRR  no s3 or murmur or increase in P2, no edema   ABD:  soft and nontender with nl inspiratory excursion in the supine position. No bruits or organomegaly, bowel sounds nl  MS:  Nl gait/ ext warm without deformities, calf tenderness, cyanosis or clubbing No obvious joint restrictions   SKIN: warm and dry without lesions    NEURO:  alert, approp, nl sensorium with  no motor deficits       CXR PA and Lateral:   07/13/2016 :    I personally reviewed images and agree with radiology impression as follows:    COPD/chronic changes.  No active disease.     Assessment & Plan:

## 2016-07-17 NOTE — Assessment & Plan Note (Signed)
-   PFT's 07/02/11  FEV1  1.41 (55%) ratio 66 and 12% better p B2 with DLCO 96% - PFTs  07/05/2013 FEV1  1.05 and 1.31 p B2 (24%) with DLCO 59 corrects to 84%  - Spiriva and other mdi result in hoarseness - hfa 05/25/2013 75% p coaching>  90% 07/05/13  - added gerd rx 05/25/2013 >> improved 07/05/13  - rx dulera 200 2bid 07/05/13 > better 10/03/2013   - 10/08/2015 added GERD rx back due to pm strangling/ hoarseness  - 10/25/2015 added flutter - Rehab completed Aug 2017  - PFT's  05/21/2016  FEV1 1.23 (50 % ) ratio 45  p 32 % improvement from saba p dulera 200 prior to study with DLCO  56/57 % corrects to 72  % for alv volume   - 07/13/2016  After extensive coaching HFA effectiveness =    90% from a baseline of 75%  Mild flare with minimal increase need for saba on present maitnt rx with dulera 200/singulair with prominent upper airway features c/w gerd   Of the three most common causes of chronic cough, only one (GERD)  can actually cause the other two (asthma and post nasal drip syndrome)  and perpetuate the cylce of cough inducing airway trauma, inflammation, heightened sensitivity to reflux which is prompted by the cough itself via a cyclical mechanism.    This may partially respond to steroids and look like asthma and post nasal drainage but never erradicated completely unless the cough and the secondary reflux are eliminated, preferably both at the same time.  While not intuitively obvious, many patients with chronic low grade reflux do not cough until there is a secondary insult that disturbs the protective epithelial barrier and exposes sensitive nerve endings.  This can be viral as is likely the case here  or direct physical injury such as with an endotracheal tube.   The point is that once this occurs, it is difficult to eliminate using anything but a maximally effective acid suppression regimen at least in the short run, accompanied by an appropriate diet to address non acid GERD.  rec max rx for  gerd/cyclical cough and rx ? aecopd with zpak/and pred x6 days since the holidays are upon [place] but really doubt this is a significant aecopd  I had an extended discussion with the patient reviewing all relevant studies completed to date and  lasting 15 to 20 minutes of a 25 minute acute visit    Each maintenance medication was reviewed in detail including most importantly the difference between maintenance and prns and under what circumstances the prns are to be triggered using an action plan format that is not reflected in the computer generated alphabetically organized AVS.    Please see instructions for details which were reviewed in writing and the patient given a copy highlighting the part that I personally wrote and discussed at today's ov.

## 2016-07-17 NOTE — Assessment & Plan Note (Signed)
Mild Flare x 07/09/16 ? vrial uri > rx zpak/pred x6  - see copd a/p

## 2016-07-23 ENCOUNTER — Other Ambulatory Visit: Payer: Self-pay | Admitting: Internal Medicine

## 2016-07-29 ENCOUNTER — Encounter (HOSPITAL_COMMUNITY): Payer: PPO

## 2016-08-07 ENCOUNTER — Other Ambulatory Visit: Payer: Self-pay | Admitting: Pharmacist

## 2016-08-07 NOTE — Patient Outreach (Addendum)
Outreach call to Phillip Jordan regarding his request for follow up from the [facility] Medication Adherence Campaign. Left a HIPAA compliant message for the patient.  Harlow Asa, PharmD, [facility] [phone]

## 2016-08-18 ENCOUNTER — Other Ambulatory Visit: Payer: Self-pay | Admitting: Internal Medicine

## 2016-08-31 ENCOUNTER — Other Ambulatory Visit: Payer: Self-pay | Admitting: Internal Medicine

## 2016-10-06 ENCOUNTER — Ambulatory Visit (INDEPENDENT_AMBULATORY_CARE_PROVIDER_SITE_OTHER): Payer: PPO | Admitting: Podiatry

## 2016-10-06 DIAGNOSIS — Q828 Other specified congenital malformations of skin: Secondary | ICD-10-CM

## 2016-10-06 DIAGNOSIS — E119 Type 2 diabetes mellitus without complications: Secondary | ICD-10-CM

## 2016-10-06 DIAGNOSIS — B351 Tinea unguium: Secondary | ICD-10-CM | POA: Diagnosis not present

## 2016-10-06 NOTE — Progress Notes (Signed)
Patient ID: Phillip Jordan, male   DOB: 03/01/1930, 83 y.o.   MRN: [id number] Complaint:  Visit Type: Patient returns to my office for continued preventative foot care services. Complaint: Patient states" my nails have grown long and thick and become painful to walk and wear shoes" Patient has been diagnosed with DM with no foot complications. The patient presents for preventative foot care services. No changes to ROS.  He has been under treatment for COPD.  Patient has callus under the ball of right foot.  Podiatric Exam: Vascular: dorsalis pedis and posterior tibial pulses are palpable bilateral. Capillary return is immediate. Temperature gradient is WNL. Skin turgor WNL  Sensorium: Normal Semmes Weinstein monofilament test except toes both feet.. Normal tactile sensation bilaterally. Nail Exam: Pt has thick disfigured discolored nails with subungual debris noted bilateral entire nail hallux through fifth toenails Ulcer Exam: There is no evidence of ulcer or pre-ulcerative changes or infection. Orthopedic Exam: Muscle tone and strength are WNL. No limitations in general ROM. No crepitus or effusions noted. Foot type and digits show no abnormalities. Bony prominences are unremarkable. Skin: Porokeratosis sub 1.5 right foot. Hemmorhagic porokeratosis noted. No infection or ulcers  Diagnosis:  Onychomycosis, , Pain in right toe, pain in left toes. Porokeratosis  Right foot  Treatment & Plan Procedures and Treatment: Consent by patient was obtained for treatment procedures. The patient understood the discussion of treatment and procedures well. All questions were answered thoroughly reviewed. Debridement of mycotic and hypertrophic toenails, 1 through 5 bilateral and clearing of subungual debris. No ulceration, no infection noted. Debride porokeratosis right foot. Return Visit-Office Procedure: Patient instructed to return to the office for a follow up visit 3 months for continued evaluation and  treatment.    Jamika Sadek DPM 

## 2016-10-14 ENCOUNTER — Other Ambulatory Visit: Payer: Self-pay | Admitting: Internal Medicine

## 2016-10-21 ENCOUNTER — Ambulatory Visit (INDEPENDENT_AMBULATORY_CARE_PROVIDER_SITE_OTHER): Payer: PPO | Admitting: Internal Medicine

## 2016-10-21 ENCOUNTER — Encounter: Payer: Self-pay | Admitting: Internal Medicine

## 2016-10-21 VITALS — BP 132/70 | HR 63 | Temp 98.2°F | Resp 14 | Ht 69.0 in | Wt 172.2 lb

## 2016-10-21 DIAGNOSIS — E785 Hyperlipidemia, unspecified: Secondary | ICD-10-CM | POA: Diagnosis not present

## 2016-10-21 DIAGNOSIS — E11319 Type 2 diabetes mellitus with unspecified diabetic retinopathy without macular edema: Secondary | ICD-10-CM | POA: Diagnosis not present

## 2016-10-21 DIAGNOSIS — Z0000 Encounter for general adult medical examination without abnormal findings: Secondary | ICD-10-CM

## 2016-10-21 LAB — LIPID PANEL
Cholesterol: 150 mg/dL (ref 0–200)
HDL: 39.5 mg/dL (ref >39.00)
LDL CALC: 83 mg/dL (ref 0–99)
NONHDL: 110.67
Total CHOL/HDL Ratio: 4
Triglycerides: 139 mg/dL (ref 0.0–149.0)
VLDL: 27.8 mg/dL (ref 0.0–40.0)

## 2016-10-21 LAB — HEMOGLOBIN A1C: Hgb A1c MFr Bld: 6.6 % — ABNORMAL HIGH (ref 4.6–6.5)

## 2016-10-21 LAB — ALT: ALT: 17 U/L (ref 0–53)

## 2016-10-21 LAB — AST: AST: 20 U/L (ref 0–37)

## 2016-10-21 MED ORDER — TIOTROPIUM BROMIDE MONOHYDRATE 18 MCG IN CAPS: 18.0000 ug | ORAL_CAPSULE | Freq: Every day | RESPIRATORY_TRACT | 12 refills | Status: DC

## 2016-10-21 NOTE — Patient Instructions (Signed)
GO TO THE LAB : Get the blood work     GO TO THE FRONT DESK Schedule your next appointment for a  checkup regards your asthma in 3 months   Add SPIRIVA   Diabetes and Foot Care Diabetes may cause you to have problems because of poor blood supply (circulation) to your feet and legs. This may cause the skin on your feet to become thinner, break easier, and heal more slowly. Your skin may become dry, and the skin may peel and crack. You may also have nerve damage in your legs and feet causing decreased feeling in them. You may not notice minor injuries to your feet that could lead to infections or more serious problems. Taking care of your feet is one of the most important things you can do for yourself. Follow these instructions at home:  Wear shoes at all times, even in the house. Do not go barefoot. Bare feet are easily injured.  Check your feet daily for blisters, cuts, and redness. If you cannot see the bottom of your feet, use a mirror or ask someone for help.  Wash your feet with warm water (do not use hot water) and mild soap. Then pat your feet and the areas between your toes until they are completely dry. Do not soak your feet as this can dry your skin.  Apply a moisturizing lotion or petroleum jelly (that does not contain alcohol and is unscented) to the skin on your feet and to dry, brittle toenails. Do not apply lotion between your toes.  Trim your toenails straight across. Do not dig under them or around the cuticle. File the edges of your nails with an emery board or nail file.  Do not cut corns or calluses or try to remove them with medicine.  Wear clean socks or stockings every day. Make sure they are not too tight. Do not wear knee-high stockings since they may decrease blood flow to your legs.  Wear shoes that fit properly and have enough cushioning. To break in new shoes, wear them for just a few hours a day. This prevents you from injuring your feet. Always look in your  shoes before you put them on to be sure there are no objects inside.  Do not cross your legs. This may decrease the blood flow to your feet.  If you find a minor scrape, cut, or break in the skin on your feet, keep it and the skin around it clean and dry. These areas may be cleansed with mild soap and water. Do not cleanse the area with peroxide, alcohol, or iodine.  When you remove an adhesive bandage, be sure not to damage the skin around it.  If you have a wound, look at it several times a day to make sure it is healing.  Do not use heating pads or hot water bottles. They may burn your skin. If you have lost feeling in your feet or legs, you may not know it is happening until it is too late.  Make sure your health care provider performs a complete foot exam at least annually or more often if you have foot problems. Report any cuts, sores, or bruises to your health care provider immediately. Contact a health care provider if:  You have an injury that is not healing.  You have cuts or breaks in the skin.  You have an ingrown nail.  You notice redness on your legs or feet.  You feel burning or tingling  in your legs or feet.  You have pain or cramps in your legs and feet.  Your legs or feet are numb.  Your feet always feel cold. Get help right away if:  There is increasing redness, swelling, or pain in or around a wound.  There is a red line that goes up your leg.  Pus is coming from a wound.  You develop a fever or as directed by your health care provider.  You notice a bad smell coming from an ulcer or wound. This information is not intended to replace advice given to you by your health care provider. Make sure you discuss any questions you have with your health care provider. Document Released: 08/07/2000 Document Revised: 01/16/2016 Document Reviewed: 01/17/2013 Elsevier Interactive Patient Education  2017 [employer].

## 2016-10-21 NOTE — Progress Notes (Signed)
Pre visit review using our clinic review tool, if applicable. No additional management support is needed unless otherwise documented below in the visit note. 

## 2016-10-21 NOTE — Progress Notes (Signed)
Subjective:    Patient ID: Phillip Jordan, male    DOB: 1930-06-13, 83 y.o.   MRN: [id number]  DOS:  10/21/2016 Type of visit - description : CPX Interval history: In addition to CPX, we talked about a # of other issues Doing well with diet, ambulatory blood sugars 100, 120 He had an asthma exacerbation 06-2016, took a Z-Pak and steroids, since then has is still occasional cough and wheezing sometimes. No sputum production. Was seen with R groin pain, see last OV, resolved. No GERD symptoms.   Review of Systems  Other than above, a 14 point review of systems is negative   Past Medical History:  Diagnosis Date  . Allergic rhinitis   . Aortic dissection ([place])    f/u by cards - AAA with distal dissection.  [name] CAD (coronary artery disease)    a. s/p MI 1992. b. Prior hx stenting to RCA/Cx. b. 05/2013: [place] s/p DES to distal RCA then staged DES to prox LAD.   [name] COPD with asthma ([place])   . Diabetes mellitus   . Hyperlipidemia   . Lichen planus    Right midline inferior chest, Dr. [name] Sensing  . Shingles     Past Surgical History:  Procedure Laterality Date  . BACK SURGERY  1993  . CARDIAC CATHETERIZATION     left, w/ coronary angiography and left ventriculograpy  . LEFT HEART CATHETERIZATION WITH CORONARY ANGIOGRAM N/A 06/09/2013   Procedure: LEFT HEART CATHETERIZATION WITH CORONARY ANGIOGRAM;  Surgeon: [name], MD;  Location: [facility] CATH LAB;  Service: Cardiovascular;  Laterality: N/A;  . PERCUTANEOUS CORONARY STENT INTERVENTION (PCI-S) N/A 06/12/2013   Procedure: PERCUTANEOUS CORONARY STENT INTERVENTION (PCI-S);  Surgeon: [name], MD;  Location: [facility] CATH LAB;  Service: Cardiovascular;  Laterality: N/A;  prox LAD  . PTCA     w/ placement of drugeluting stent in the distal right coronary artery, and distal Cardiologist: [name]    Social History   Social History  . Marital status: Married    Spouse name: N/A  . Number of children: 3  . Years of  education: N/A   Occupational History  . retired    Social History Main Topics  . Smoking status: Former Smoker    Packs/day: 1.50    Years: 39.00    Types: Cigarettes    Quit date: 08/24/1992  . Smokeless tobacco: Never Used  . Alcohol use No  . Drug use: No  . Sexual activity: Not on file   Other Topics Concern  . Not on file   Social History Narrative   Lives w/ wife    3 kids, 30 [id number]                  Family History  Problem Relation Age of Onset  . Asthma Mother   . Heart disease Mother     CHF  . Cancer Mother     melanoma  . Heart disease Father     MI  . Prostate cancer Neg Hx   . Colon cancer Neg Hx      Allergies as of 10/21/2016   No Known Allergies     Medication List       Accurate as of 10/21/16 11:59 PM. Always use your most recent med list.          ACAPELLA Misc Use as directed   acetaminophen 500 MG tablet Commonly known as:  TYLENOL Take 1,000 mg by mouth every  6 (six) hours as needed for pain. Reported on 03/09/2016   albuterol 108 (90 Base) MCG/ACT inhaler Commonly known as:  PROAIR HFA 2 puffs every 4 hours as needed only  if your can't catch your breath   aspirin 81 MG tablet Take 1 tablet (81 mg total) by mouth daily.   clopidogrel 75 MG tablet Commonly known as:  PLAVIX Take 1 tablet (75 mg total) by mouth daily.   famotidine 20 MG tablet Commonly known as:  PEPCID One at bedtime   glucose blood test strip Commonly known as:  FREESTYLE TEST STRIPS Check blood sugar no more than twice daily   isosorbide mononitrate 30 MG 24 hr tablet Commonly known as:  IMDUR Take 1 tablet (30 mg total) by mouth 2 (two) times daily.   loratadine 10 MG tablet Commonly known as:  CLARITIN Take 10 mg by mouth daily as needed for allergies.   metFORMIN 500 MG tablet Commonly known as:  GLUCOPHAGE Take 1 tablet (500 mg total) by mouth daily with supper.   mometasone-formoterol 200-5 MCG/ACT Aero Commonly known as:   DULERA Inhale 2 puffs into the lungs 2 (two) times daily.   montelukast 10 MG tablet Commonly known as:  SINGULAIR Take 1 tablet (10 mg total) by mouth at bedtime.   multivitamin capsule Take 1 capsule by mouth daily.   multivitamin with minerals Tabs tablet Take 1 tablet by mouth daily.   nitroGLYCERIN 0.4 MG SL tablet Commonly known as:  NITROSTAT Place 1 tablet (0.4 mg total) under the tongue every 5 (five) minutes as needed for chest pain (up to 3 doses only.).   pantoprazole 40 MG tablet Commonly known as:  PROTONIX TAKE ONE TABLET BY MOUTH ONCE DAILY **TAKE  30-60  MINUTES  BEFORE  THE  FIRST  MEAL  OF  THE  DAY**   pravastatin 40 MG tablet Commonly known as:  PRAVACHOL Take 1 tablet (40 mg total) by mouth daily.   tiotropium 18 MCG inhalation capsule Commonly known as:  SPIRIVA HANDIHALER Place 1 capsule (18 mcg total) into inhaler and inhale daily.   vitamin B-12 1000 MCG tablet Commonly known as:  CYANOCOBALAMIN Take 1,000 mcg by mouth daily.          Objective:   Physical Exam BP 132/70 (BP Location: Left Arm, Patient Position: Sitting, Cuff Size: Normal)   Pulse 63   Temp 98.2 F (36.8 C) (Oral)   Resp 14   Ht 5\' 9"  (1.753 m)   Wt 172 lb 4 oz (78.1 kg)   SpO2 97%   BMI 25.44 kg/m   General:   Well developed, well nourished . NAD.  Neck: No  thyromegaly  HEENT:  Normocephalic . Face symmetric, atraumatic Lungs:  Few wheezes bilaterally, no rhonchi. Normal respiratory effort, no intercostal retractions, no accessory muscle use. Heart: RRR,  no murmur.  No pretibial edema bilaterally  Abdomen:  Not distended, soft, non-tender. No rebound or rigidity.  No mass on today's exam Skin: Exposed areas without rash. Not pale. Not jaundice Neurologic:  alert & oriented X3.  Speech normal, gait appropriate for age and unassisted Strength symmetric and appropriate for age.  Psych: Cognition and judgment appear intact.  Cooperative with normal attention  span and concentration.  Behavior appropriate. No anxious or depressed appearing.    Assessment & Plan:   Assessment Diabetes, + retinopathy, + neuropathy Hyperlipidemia CV --CAD --AAA infrarenal  last [place] 11/2014, stable; + B iliac stenosis >50%, next 2years --RBBB COPD  and asthma (on GERD meds)--- Dr [name] H/o Shingles Lichen planus Dr. Ronnald Ramp Chronic hoarseness, status post ENT eval ~ 2012 Normal DEXA 2012  PLAN DM with retinopathy and neuropathy: Check A1c, continue metformin, feet care discussed Hyperlipidemia: Continue Pravachol, checking a FLP, AST, ALT COPD asthma: Still has on and off sxs. GERD well-controlled on dual therapy. Continue Singulair, Dulera, albuterol as needed. Add spiriva and reassess in 3 months.states cost likely to be an issue  See last visit, had right groin pain, sxs gone. [place] was not done. Observation for now. RTC 3 months

## 2016-10-21 NOTE — Assessment & Plan Note (Addendum)
Td 2007 ;pneumonia shot x 3 , last 05-2012; prevnar: 08-2015 zostavax - 2009   Cscope 2007, TICS, no polyps (Dr Sharlett Iles). D/w pt guidelines, we agreed no further screening  No further prostate cancer screening, see previous notes Diet and exercise discussed

## 2016-10-22 NOTE — Assessment & Plan Note (Signed)
DM with retinopathy and neuropathy: Check A1c, continue metformin, feet care discussed Hyperlipidemia: Continue Pravachol, checking a FLP, AST, ALT COPD asthma: Still has on and off sxs. GERD well-controlled on dual therapy. Continue Singulair, Dulera, albuterol as needed. Add spiriva and reassess in 3 months.states cost likely to be an issue  See last visit, had right groin pain, sxs gone. [place] was not done. Observation for now. RTC 3 months

## 2016-10-26 ENCOUNTER — Telehealth: Payer: Self-pay | Admitting: Internal Medicine

## 2016-10-26 ENCOUNTER — Other Ambulatory Visit: Payer: Self-pay | Admitting: Internal Medicine

## 2016-10-26 DIAGNOSIS — J449 Chronic obstructive pulmonary disease, unspecified: Secondary | ICD-10-CM

## 2016-10-26 NOTE — Telephone Encounter (Signed)
They are not interchangable - I would rec instead he get both the spriiva and symbicort  160 (to replace dulera) though the [place] and try that combo x one month but see me before first refill to re eval what he really needs going forward ( if he wants my input, or other option is let Dr [name] handle both meds, whichever he prefers)

## 2016-10-26 NOTE — Telephone Encounter (Signed)
Spoke with pt. States that he went to his PCP today. Dr. [name] gave him a prescription for Spiriva Handihaler. Pt can get Spiriva through the [place] cheaper than he can [facility]. He would like Spiriva to take the place of Dulera.  MW - please advise. Thanks.

## 2016-10-27 MED ORDER — BUDESONIDE-FORMOTEROL FUMARATE 160-4.5 MCG/ACT IN AERO
2.0000 | INHALATION_SPRAY | Freq: Two times a day (BID) | RESPIRATORY_TRACT | 5 refills | Status: DC
Start: 2016-10-27 — End: 2016-10-27

## 2016-10-27 NOTE — Telephone Encounter (Signed)
Spoke with pt and his wife. They are aware of MW's response. Pt and his wife do not want him taking 2 medications. He will keep taking [facility] for now. An OV has been scheduled with MW per their request. Nothing further was needed.

## 2016-10-27 NOTE — Telephone Encounter (Signed)
lmtcb x1 for pt. 

## 2016-11-23 ENCOUNTER — Other Ambulatory Visit: Payer: Self-pay | Admitting: Internal Medicine

## 2016-11-23 DIAGNOSIS — J449 Chronic obstructive pulmonary disease, unspecified: Secondary | ICD-10-CM

## 2016-11-30 ENCOUNTER — Encounter: Payer: Self-pay | Admitting: Internal Medicine

## 2016-11-30 ENCOUNTER — Ambulatory Visit (INDEPENDENT_AMBULATORY_CARE_PROVIDER_SITE_OTHER): Payer: PPO | Admitting: Internal Medicine

## 2016-11-30 VITALS — BP 134/64 | HR 80 | Ht 69.0 in | Wt 175.0 lb

## 2016-11-30 DIAGNOSIS — J449 Chronic obstructive pulmonary disease, unspecified: Secondary | ICD-10-CM | POA: Diagnosis not present

## 2016-11-30 NOTE — Patient Instructions (Signed)
Change the protonix to Take 30- 60 min before your first and last meals of the day until no wheeze then ok to just take once a day 30 min before first meal     Only use your albuterol as a rescue medication to be used if you can't catch your breath by resting or doing a relaxed purse lip breathing pattern.  - The less you use it, the better it will work when you need it. - Ok to use up to 2 puffs  every 4 hours if you must but call for immediate appointment if use goes up over your usual need - Don't leave home without it !!  (think of it like the spare tire for your car)   GERD (REFLUX)  is an extremely common cause of respiratory symptoms just like yours , many times with no obvious heartburn at all.    It can be treated with medication, but also with lifestyle changes including elevation of the head of your bed (ideally with 6 inch  bed blocks),  Smoking cessation, avoidance of late meals, excessive alcohol, and avoid fatty foods, chocolate, peppermint, colas, red wine, and acidic juices such as orange juice.  NO MINT OR MENTHOL PRODUCTS SO NO COUGH DROPS  USE SUGARLESS CANDY INSTEAD (Jolley ranchers or Stover's or Life Savers) or even ice chips will also do - the key is to swallow to prevent all throat clearing. NO OIL BASED VITAMINS - use powdered substitutes.    If not better return to clinic

## 2016-11-30 NOTE — Progress Notes (Signed)
Subjective:     Patient ID: Phillip Jordan, male    DOB: October 06, 1929   MRN: [id number]    Brief patient profile:  83  year old male, ex-smoker (1ppd x 30 years, quit 1990s), diagnosed with COPD per hx by Dr [name] (fev1 1.38L/44%, ratio 51 in Jan 2009). C/w GOLD III     History of Present Illness  05/25/2013 consult/ [name] re: sob/ prev eval MR with GOLD III COPD Chief Complaint  Patient presents with  . Pulmonary Consult    Referred per Dr. Kathlene November. The pt c/o DOE for the past 15 years, but worse for the past 6 months. He states that he gets SOB with walking up hills, but does okay with his exercise program 2-3 times per wk. He also c/o hoarseness and cough "for a long time"- cough is prod in the am with minimal clear sputum.      All his days are about the same in terms of activity tolerance  Breathing got better p quit smoking then worse again starting around mid 1990s Better on dulera, no better on spiriva ? Worse hoarseness so stopped it Work on inhaler technique:    Pantoprazole (protonix) 40 mg   Take 30-60 min before first meal of the day and Pepcid 20 mg one bedtime until return to office - this is the best way to tell whether stomach acid is contributing to your problem.  GERD diet   07/05/2013 f/u ov/Wert re: COPD GOLD III Chief Complaint  Patient presents with  . Followup with PFT    Pt states that overall his SOB and cough have improved since the last visit. He denies any new co's today.    No longer needing much albuterol at all and able to tol desired activities s tendency to aecopd but still "not as good as I get on prednisone" rec Increase dulera to 200 Take 2 puffs first thing in am and then another 2 puffs about 12 hours later.  Work on inhaler technique:  If you like the dulera 200 better after 2 weeks fill the rx> done    05/21/2016  f/u ov/Wert re:  GOLD III copd with marked reversibility/ dulera 200 2bid and singulair daily  Chief Complaint  Patient  presents with  . Follow-up    PFT's done today. Breathing is doing well and he denies any new co's today.   does treadmill once a week 65mph x 3 degrees x 20 m s stopping  Which has improved since did rehab  rec F/u can be as needed   07/13/2016 acute extended ov/Wert re: acute flare despite maint with dulera 200 / singulair  Chief Complaint  Patient presents with  . Acute Visit    Pt c/o sore throat, PND, wheezing and SOB x 4 days. Pt states that his sore throat has improved. Pt notes some imporvement in his cough.   Has not really needed saba much over baseline/ symptoms appear to be improving s specific rx rec Change the protonix to Take 30- 60 min before your first and last meals of the day until no cough then ok to just take once a day 30 min before first meal Work on inhaler technique:  relax and gently blow all the way out then take a nice smooth deep breath back in, triggering the inhaler at same time you start breathing in.  Hold for up to 5 seconds if you can. Blow out thru nose. Rinse and gargle with water  when done zpak Prednisone 10 mg take  4 each am x 2 days,   2 each am x 2 days,  1 each am x 2 days and stop    11/30/2016  f/u ov/Wert re:  GOLD III COPD / dulera 200 bid and singulair/ did not start spiriva as rec by [place]  Chief Complaint  Patient presents with  . Follow-up    Increased wheezing and hoarseness for the past wk. He is not coughing much.  He uses albuterol inhaler 1 x daily on average.   no noct symptoms, others hear the wheezing but he does not notice it  Only uses saba when wife tells him to and never disturbs his sleep  No obvious day to day or daytime variability or assoc excess/ purulent sputum or mucus plugs or hemoptysis or cp or chest tightness, subjective wheeze or overt sinus or hb symptoms. No unusual exp hx or h/o childhood pna/ asthma or knowledge of premature birth.  Sleeping ok without nocturnal  or early am exacerbation  of respiratory  c/o's or  need for noct saba. Also denies any obvious fluctuation of symptoms with weather or environmental changes or other aggravating or alleviating factors except as outlined above   Current Medications, Allergies, Complete Past Medical History, Past Surgical History, Family History, and Social History were reviewed in [employer] record.  ROS  The following are not active complaints unless bolded sore throat, dysphagia, dental problems, itching, sneezing,  nasal congestion or excess/ purulent secretions, ear ache,   fever, chills, sweats, unintended wt loss, classically pleuritic or exertional cp,  orthopnea pnd or leg swelling, presyncope, palpitations, abdominal pain, anorexia, nausea, vomiting, diarrhea  or change in bowel or bladder habits, change in stools or urine, dysuria,hematuria,  rash, arthralgias, visual complaints, headache, numbness, weakness or ataxia or problems with walking or coordination,  change in mood/affect or memory.               Objective:   Physical Exam  amb robust wm nad mod hoarse - vital signs reviewed .- Note on arrival 02 sats  97% on RA    11/30/2016           175  10/03/2013        175  > 10/08/2015  176 >  10/21/2015 173 > 10/25/2015   173 > 11/08/2015 170 > 05/21/2016  172 > 07/13/2016 176  07/05/13 176 lb (79.833 kg)  06/26/13 173 lb 12.8 oz (78.835 kg)  06/16/13 174 lb (78.926 kg)        HEENT: nl dentition  and oropharynx. Nl external ear canals without cough reflex- mucopurulent discharge both turnbinates R> L   NECK :  without JVD/Nodes/TM/ nl carotid upstrokes bilaterally   LUNGS: no acc muscle use,  slt barrel contour chest- completely clear except for minimal pseudowheeze p saba @ 4 h prior to OV      CV:  RRR  no s3 or murmur or increase in P2, no edema   ABD:  soft and nontender with nl inspiratory excursion in the supine position. No bruits or organomegaly, bowel sounds nl  MS:  Nl gait/ ext warm without deformities, calf  tenderness, cyanosis or clubbing No obvious joint restrictions   SKIN: warm and dry without lesions    NEURO:  alert, approp, nl sensorium with  no motor deficits       CXR PA and Lateral:   07/13/2016 :    I personally reviewed  images and agree with radiology impression as follows:    COPD/chronic changes.  No active disease.     Assessment & Plan:   Outpatient Encounter Prescriptions as of 11/30/2016  Medication Sig  . acetaminophen (TYLENOL) 500 MG tablet Take 1,000 mg by mouth every 6 (six) hours as needed for pain. Reported on 03/09/2016  . albuterol (PROAIR HFA) 108 (90 Base) MCG/ACT inhaler 2 puffs every 4 hours as needed only  if your can't catch your breath  . aspirin 81 MG tablet Take 1 tablet (81 mg total) by mouth daily.  . clopidogrel (PLAVIX) 75 MG tablet Take 1 tablet (75 mg total) by mouth daily.  . famotidine (PEPCID) 20 MG tablet TAKE 1 TABLET BY MOUTH AT BEDTIME  . glucose blood (FREESTYLE TEST STRIPS) test strip Check blood sugar no more than twice daily  . isosorbide mononitrate (IMDUR) 30 MG 24 hr tablet Take 1 tablet (30 mg total) by mouth 2 (two) times daily.  [name] loratadine (CLARITIN) 10 MG tablet Take 10 mg by mouth daily as needed for allergies.   . metFORMIN (GLUCOPHAGE) 500 MG tablet Take 1 tablet (500 mg total) by mouth daily with supper.  . Misc. Devices (ACAPELLA) MISC Use as directed  . mometasone-formoterol (DULERA) 200-5 MCG/ACT AERO Inhale 2 puffs into the lungs 2 (two) times daily.  . montelukast (SINGULAIR) 10 MG tablet Take 1 tablet (10 mg total) by mouth at bedtime.  . Multiple Vitamin (MULTIVITAMIN WITH MINERALS) TABS tablet Take 1 tablet by mouth daily.  . nitroGLYCERIN (NITROSTAT) 0.4 MG SL tablet Place 1 tablet (0.4 mg total) under the tongue every 5 (five) minutes as needed for chest pain (up to 3 doses only.).  [name] pantoprazole (PROTONIX) 40 MG tablet TAKE ONE TABLET BY MOUTH ONCE DAILY **TAKE  30-60  MINUTES  BEFORE  THE  FIRST  MEAL  OF  THE   DAY**  . pravastatin (PRAVACHOL) 40 MG tablet Take 1 tablet (40 mg total) by mouth daily.  . vitamin B-12 (CYANOCOBALAMIN) 1000 MCG tablet Take 1,000 mcg by mouth daily.  [name] tiotropium (SPIRIVA HANDIHALER) 18 MCG inhalation capsule Place 1 capsule (18 mcg total) into inhaler and inhale daily. (Patient not taking: Reported on 11/30/2016)  . [DISCONTINUED] Multiple Vitamin (MULTIVITAMIN) capsule Take 1 capsule by mouth daily.   No facility-administered encounter medications on file as of 11/30/2016.

## 2016-12-01 NOTE — Assessment & Plan Note (Signed)
-   PFT's 07/02/11  FEV1  1.41 (55%) ratio 66 and 12% better p B2 with DLCO 96% - PFTs  07/05/2013 FEV1  1.05 and 1.31 p B2 (24%) with DLCO 59 corrects to 84%  - Spiriva and other mdi result in hoarseness - hfa 05/25/2013 75% p coaching>  90% 07/05/13  - added gerd rx 05/25/2013 >> improved 07/05/13  - rx dulera 200 2bid 07/05/13 > better 10/03/2013   - 10/08/2015 added GERD rx back due to pm strangling/ hoarseness  - 10/25/2015 added flutter - Rehab completed Aug 2017  - PFT's  05/21/2016  FEV1 1.23 (50 % ) ratio 45  p 32 % improvement from saba p dulera 200 prior to study with DLCO  56/57 % corrects to 72  % for alv volume    - 11/30/2016  After extensive coaching HFA effectiveness =    90%   Adequate control on present rx, reviewed in detail with pt > no change in rx needed    His "wheezing" heard by wife is likely upper airway since no assoc cough or sleep disturbance > rec rx with max ppi and if not improving return here for eval s taking any hfa first  - pulmonary f/u can be prn   If spiriva is being considered would strongly advise the respimat over the dpi since the latter causes more cough/ upper airway symptoms like pseudowheeze   Each maintenance medication was reviewed in detail including most importantly the difference between maintenance and as needed and under what circumstances the prns are to be used.  Please see AVS for specific  Instructions which are unique to this visit and I personally typed out  which were reviewed in detail in writing with the patient and a copy provided.

## 2017-01-05 ENCOUNTER — Encounter: Payer: Self-pay | Admitting: Podiatry

## 2017-01-05 ENCOUNTER — Ambulatory Visit (INDEPENDENT_AMBULATORY_CARE_PROVIDER_SITE_OTHER): Payer: PPO | Admitting: Podiatry

## 2017-01-05 DIAGNOSIS — Q828 Other specified congenital malformations of skin: Secondary | ICD-10-CM

## 2017-01-05 DIAGNOSIS — E119 Type 2 diabetes mellitus without complications: Secondary | ICD-10-CM

## 2017-01-05 DIAGNOSIS — M79676 Pain in unspecified toe(s): Secondary | ICD-10-CM

## 2017-01-05 DIAGNOSIS — B351 Tinea unguium: Secondary | ICD-10-CM

## 2017-01-05 NOTE — Progress Notes (Signed)
Patient ID: Phillip Jordan, male   DOB: 1929/10/27, 83 y.o.   MRN: [id number] Complaint:  Visit Type: Patient returns to my office for continued preventative foot care services. Complaint: Patient states" my nails have grown long and thick and become painful to walk and wear shoes" Patient has been diagnosed with DM with no foot complications. The patient presents for preventative foot care services. No changes to ROS.  He has been under treatment for COPD.  Patient has callus under the ball of right foot.  Podiatric Exam: Vascular: dorsalis pedis and posterior tibial pulses are palpable bilateral. Capillary return is immediate. Temperature gradient is WNL. Skin turgor WNL  Sensorium: Normal Semmes Weinstein monofilament test except toes both feet.. Normal tactile sensation bilaterally. Nail Exam: Pt has thick disfigured discolored nails with subungual debris noted bilateral entire nail hallux through fifth toenails Ulcer Exam: There is no evidence of ulcer or pre-ulcerative changes or infection. Orthopedic Exam: Muscle tone and strength are WNL. No limitations in general ROM. No crepitus or effusions noted. Foot type and digits show no abnormalities. Bony prominences are unremarkable. Skin: Porokeratosis sub 1.5 right foot. Hemmorhagic porokeratosis noted. No infection or ulcers  Diagnosis:  Onychomycosis, , Pain in right toe, pain in left toes. Porokeratosis  Right foot  Treatment & Plan Procedures and Treatment: Consent by patient was obtained for treatment procedures. The patient understood the discussion of treatment and procedures well. All questions were answered thoroughly reviewed. Debridement of mycotic and hypertrophic toenails, 1 through 5 bilateral and clearing of subungual debris. No ulceration, no infection noted. Debride porokeratosis right foot. Return Visit-Office Procedure: Patient instructed to return to the office for a follow up visit 3 months for continued evaluation and  treatment.    [name] DPM

## 2017-01-14 ENCOUNTER — Telehealth: Payer: Self-pay | Admitting: Cardiovascular Disease

## 2017-01-14 ENCOUNTER — Telehealth: Payer: Self-pay | Admitting: Internal Medicine

## 2017-01-14 NOTE — Telephone Encounter (Signed)
New message     Pt c/o Shortness Of Breath: STAT if SOB developed within the last 24 hours or pt is noticeably SOB on the phone  1. Are you currently SOB (can you hear that pt is SOB on the phone)? Wife calling   2. How long have you been experiencing SOB? A week   3. Are you SOB when sitting or when up moving around? Moving around and bending over  4. Are you currently experiencing any other symptom lathargic , dizziness

## 2017-01-14 NOTE — Telephone Encounter (Signed)
Be sure taking protonix Take 30- 60 min before your first and last meals of the day   Be sure using abuterol up to q4 hprn  Add on 5/25   but bring all meds with you or go to ER if condition worsens in meantime

## 2017-01-14 NOTE — Telephone Encounter (Signed)
Called and spoke with pt and his wife and pt stated that he did call cardiology and they referred him to [place] since he is not having any heart issues.  For the last couple of weeks pt has been having fatigue, dizzy this am, SOB mostly with activity and worse at night sometimes.  He did sleep one night in the recliner.  He has had a dry cough this week.  Denies any other symptoms.  MW please advise. Thanks  Patient Instructions by [name], MD at 11/30/2016 3:15 PM   Author: [name], MD Author Type: Physician Filed: 11/30/2016 3:33 PM  Note Status: Signed Cosign: Cosign Not Required Encounter Date: 11/30/2016  Editor: [name], MD (Physician)    Change the protonix to Take 30- 60 min before your first and last meals of the day until no wheeze then ok to just take once a day 30 min before first meal     Only use your albuterol as a rescue medication to be used if you can't catch your breath by resting or doing a relaxed purse lip breathing pattern.  - The less you use it, the better it will work when you need it. - Ok to use up to 2 puffs  every 4 hours if you must but call for immediate appointment if use goes up over your usual need - Don't leave home without it !!  (think of it like the spare tire for your car)   GERD (REFLUX)  is an extremely common cause of respiratory symptoms just like yours , many times with no obvious heartburn at all.    It can be treated with medication, but also with lifestyle changes including elevation of the head of your bed (ideally with 6 inch  bed blocks),  Smoking cessation, avoidance of late meals, excessive alcohol, and avoid fatty foods, chocolate, peppermint, colas, red wine, and acidic juices such as orange juice.  NO MINT OR MENTHOL PRODUCTS SO NO COUGH DROPS  USE SUGARLESS CANDY INSTEAD (Jolley ranchers or Stover's or Life Savers) or even ice chips will also do - the key is to swallow to prevent all throat clearing. NO OIL BASED VITAMINS  - use powdered substitutes.    If not better return to clinic        Instructions   Patient Instructions           After Visit Summary (Printed 11/30/2016)  Communications      [facility] Provider CC Chart Rep sent to Colon Branch, MD Sent 12/01/2016  Media   Electronic signature on 11/30/2016 3:01 PM : 2018 [place] History   Recipient Method Sent by Date Sent  Colon Branch, MD In [place], MD 12/01/2016

## 2017-01-14 NOTE — Telephone Encounter (Signed)
Wife reports pt experiencing tiredness for about 2 weeks, diizziness when moving around, SOB and that "its just hard for him to go".  She denies that he has experienced any CP, weight gain, syncope, edema. Asked what is different about his SOB when moving around (pulmonary note reports DOE x 15 yrs) - pt states it is worse than when they saw pulmonary doctor last month. Wife is agreeable to following up with PCP about dizziness/weakness, first, and if they feel heart related she will call back to arrange OV. Advised to contact pulmonary about worsening SOB and she is agreeable. She thanks me for talking out each issue and just wasn't sure the right doctor to call.  She will call office if he needs to be seen by cardiology.

## 2017-01-14 NOTE — Telephone Encounter (Signed)
Spoke to [employer] (pt's wife). She stated that Mr. Granja is taking his protonix correctly as well as the albuterol. Offered an appt with Dr. [name] for May 25th at 130. Pt and pt wife agreed to come in. Received permission from [place] to DB patient at 130. Pt and wife are aware. Nothing else needed.

## 2017-01-15 ENCOUNTER — Encounter: Payer: Self-pay | Admitting: Internal Medicine

## 2017-01-15 ENCOUNTER — Ambulatory Visit (INDEPENDENT_AMBULATORY_CARE_PROVIDER_SITE_OTHER): Payer: PPO | Admitting: Internal Medicine

## 2017-01-15 ENCOUNTER — Ambulatory Visit (INDEPENDENT_AMBULATORY_CARE_PROVIDER_SITE_OTHER)
Admission: RE | Admit: 2017-01-15 | Discharge: 2017-01-15 | Disposition: A | Discharge status: Home / Self Care | Payer: PPO | Source: Ambulatory Visit | Attending: Internal Medicine | Admitting: Internal Medicine

## 2017-01-15 VITALS — BP 158/80 | HR 65 | Ht 69.0 in | Wt 172.4 lb

## 2017-01-15 DIAGNOSIS — J441 Chronic obstructive pulmonary disease with (acute) exacerbation: Secondary | ICD-10-CM | POA: Diagnosis not present

## 2017-01-15 DIAGNOSIS — R0602 Shortness of breath: Secondary | ICD-10-CM | POA: Diagnosis not present

## 2017-01-15 DIAGNOSIS — R05 Cough: Secondary | ICD-10-CM | POA: Diagnosis not present

## 2017-01-15 MED ORDER — PREDNISONE 10 MG PO TABS: ORAL_TABLET | ORAL | 0 refills | Status: DC

## 2017-01-15 MED ORDER — PANTOPRAZOLE SODIUM 40 MG PO TBEC: DELAYED_RELEASE_TABLET | ORAL | 11 refills | Status: DC

## 2017-01-15 NOTE — Progress Notes (Signed)
Subjective:     Patient ID: Phillip Jordan, male    DOB: [birth year]   MRN: [id number]    Brief patient profile:  83  year old male, ex-smoker (1ppd x 30 years, quit 1990s), diagnosed with COPD per hx by Dr Phillip Jordan (fev1 1.38L/44%, ratio 51 in Jan 2009). C/w GOLD III     History of Present Illness  05/25/2013 consult/ Phillip Jordan re: sob/ prev eval MR with GOLD III COPD Chief Complaint  Patient presents with  . Pulmonary Consult    Referred per Dr. Kathlene Jordan. The pt c/o DOE for the past 15 years, but worse for the past 6 months. He states that he gets SOB with walking up hills, but does okay with his exercise program 2-3 times per wk. He also c/o hoarseness and cough "for a long time"- cough is prod in the am with minimal clear sputum.      All his days are about the same in terms of activity tolerance  Breathing got better p quit smoking then worse again starting around mid 1990s Better on dulera, no better on spiriva ? Worse hoarseness so stopped it Work on inhaler technique:    Pantoprazole (protonix) 40 mg   Take 30-60 min before first meal of the day and Pepcid 20 mg one bedtime until return to office - this is the best way to tell whether stomach acid is contributing to your problem.  GERD diet    11/30/2016  f/u ov/Phillip Jordan re:  GOLD III COPD / dulera 200 bid and singulair/ did not start spiriva as rec by Phillip Jordan  Chief Complaint  Patient presents with  . Follow-up    Increased wheezing and hoarseness for the past wk. He is not coughing much.  He uses albuterol inhaler 1 x daily on average.   no noct symptoms, others hear the wheezing but he does not notice it  Only uses saba when wife tells him to and never disturbs his sleep rec Change the protonix to Take 30- 60 min before your first and last meals of the day until no wheeze then ok to just take once a day 30 min before first meal Only use your albuterol as a rescue medication  GERD diet    01/15/2017 acute extended ov/Phillip Jordan re: ? aecopd  with underlying GOLD III severity maint rx dulera 200/ singulair  Chief Complaint  Patient presents with  . Acute Visit    Pt c/o increased SOB for the past 3 wks. He also c/o rhinits. He has a non prod cough and wheezing. He is using proair 2 x daily on average.    gradually worse x 3 weeks with cough /wheeze worse after wakes and stirs / sleeping ok  Did not increase ppi to bid as prev rec with  Flares, not using saba up to 2 q4h either  Doe= MMRC3 = can't walk 100 yards even at a slow pace at a flat grade s stopping due to sob    No obvious day to day or daytime variability or assoc excess/ purulent sputum or mucus plugs or hemoptysis or cp or chest tightness, subjective wheeze or overt sinus or hb symptoms. No unusual exp hx or h/o childhood pna/ asthma or knowledge of premature birth.  Sleeping ok without nocturnal  or early am exacerbation  of respiratory  c/o's or need for noct saba. Also denies any obvious fluctuation of symptoms with weather or environmental changes or other aggravating or alleviating factors except as outlined  above   Current Medications, Allergies, Complete Past Medical History, Past Surgical History, Family History, and Social History were reviewed in [employer] record.  ROS  The following are not active complaints unless bolded sore throat, dysphagia, dental problems, itching, sneezing,  nasal congestion or excess/ purulent secretions, ear ache,   fever, chills, sweats, unintended wt loss, classically pleuritic or exertional cp,  orthopnea pnd or leg swelling, presyncope, palpitations, abdominal pain, anorexia, nausea, vomiting, diarrhea  or change in bowel or bladder habits, change in stools or urine, dysuria,hematuria,  rash, arthralgias, visual complaints, headache, numbness, weakness or ataxia or problems with walking or coordination,  change in mood/affect or memory.             Objective:   Physical Exam  amb robust wm nad mod hoarse  - vital signs reviewed .- Note on arrival 02 sats  94% on RA    01/15/2017         172  11/30/2016           175  10/03/2013        175  > 10/08/2015  176 >  10/21/2015 173 > 10/25/2015   173 > 11/08/2015 170 > 05/21/2016  172 > 07/13/2016 176  07/05/13 176 lb (79.833 kg)  06/26/13 173 lb 12.8 oz (78.835 kg)  06/16/13 174 lb (78.926 kg)        HEENT: nl dentition  and oropharynx. Nl external ear canals without cough reflex- - mild/oderate bilateral non-specific turbinate edema     NECK :  without JVD/Nodes/TM/ nl carotid upstrokes bilaterally   LUNGS: no acc muscle use,  slt barrel contour chest- scattered insp and exp rhonchi > 4 h since last saba      CV:  RRR  no s3 or murmur or increase in P2, no edema   ABD:  soft and nontender with nl inspiratory excursion in the supine position. No bruits or organomegaly, bowel sounds nl  MS:  Nl gait/ ext warm without deformities, calf tenderness, cyanosis or clubbing No obvious joint restrictions   SKIN: warm and dry without lesions    NEURO:  alert, approp, nl sensorium with  no motor deficits        CXR PA and Lateral:   01/15/2017 :    I personally reviewed images and agree with radiology impression as follows:   No acute cardiopulmonary disease. Stable COPD/ emphysema and stable interstitial pulmonary fibrosis involving the lung bases, left greater than right.      Assessment & Plan:

## 2017-01-15 NOTE — Patient Instructions (Addendum)
Prednisone 10 mg take  4 each am x 2 days,   2 each am x 2 days,  1 each am x 2 days and stop   Plan A = Automatic = Dulera 200 Take 2 puffs first thing in am and then another 2 puffs about 12 hours later.    Plan B = Backup Only use your albuterol as a rescue medication to be used if you can't catch your breath by resting or doing a relaxed purse lip breathing pattern.  - The less you use it, the better it will work when you need it. - Ok to use the inhaler up to 2 puffs  every 4 hours if you must but call for appointment if use goes up over your usual need - Don't leave home without it !!  (think of it like the spare tire for your car)    Whenever coughing / wheezing/ short of breath >  Take protonix Take 30- 60 min before your first and last meals of the day    Please remember to go to the xray department downstairs in the basement  for your tests - we will call you with the results when they are available.   If you are satisfied with your treatment plan,  let your doctor know and he/she can either refill your medications or you can return here when your prescription runs out.     If in any way you are not 100% satisfied,  please tell us.  If 100% better, tell your friends!  Pulmonary follow up is as needed

## 2017-01-19 ENCOUNTER — Encounter: Payer: Self-pay | Admitting: Internal Medicine

## 2017-01-19 ENCOUNTER — Ambulatory Visit (INDEPENDENT_AMBULATORY_CARE_PROVIDER_SITE_OTHER): Payer: PPO | Admitting: Internal Medicine

## 2017-01-19 ENCOUNTER — Other Ambulatory Visit: Payer: Self-pay | Admitting: Internal Medicine

## 2017-01-19 ENCOUNTER — Ambulatory Visit: Payer: PPO | Admitting: Nurse Practitioner

## 2017-01-19 VITALS — BP 128/76 | HR 66 | Temp 98.0°F | Resp 14 | Ht 69.0 in | Wt 173.1 lb

## 2017-01-19 DIAGNOSIS — E875 Hyperkalemia: Secondary | ICD-10-CM

## 2017-01-19 DIAGNOSIS — J449 Chronic obstructive pulmonary disease, unspecified: Secondary | ICD-10-CM

## 2017-01-19 DIAGNOSIS — I714 Abdominal aortic aneurysm, without rupture, unspecified: Secondary | ICD-10-CM

## 2017-01-19 DIAGNOSIS — E1140 Type 2 diabetes mellitus with diabetic neuropathy, unspecified: Secondary | ICD-10-CM | POA: Diagnosis not present

## 2017-01-19 NOTE — Assessment & Plan Note (Signed)
DM with retinopathy and neuropathy: Check BMP, A1c, feet care discussed. Would like to see a new podiatrist, refer him to Dr. Paulla Dolly AAA: Asx, check a ultrasound COPD: Was recently seen with exacerbation by pulmonology, feeling better RTC 4-5 months

## 2017-01-19 NOTE — Progress Notes (Signed)
Spoke with pt and notified of results per Dr. Wert. Pt verbalized understanding and denied any questions. 

## 2017-01-19 NOTE — Patient Instructions (Signed)
GO TO THE LAB : Get the blood work     GO TO THE FRONT DESK Schedule your next appointment for a  routine checkup in 4-5 months    Diabetes and Foot Care Diabetes may cause you to have problems because of poor blood supply (circulation) to your feet and legs. This may cause the skin on your feet to become thinner, break easier, and heal more slowly. Your skin may become dry, and the skin may peel and crack. You may also have nerve damage in your legs and feet causing decreased feeling in them. You may not notice minor injuries to your feet that could lead to infections or more serious problems. Taking care of your feet is one of the most important things you can do for yourself. Follow these instructions at home:  Wear shoes at all times, even in the house. Do not go barefoot. Bare feet are easily injured.  Check your feet daily for blisters, cuts, and redness. If you cannot see the bottom of your feet, use a mirror or ask someone for help.  Wash your feet with warm water (do not use hot water) and mild soap. Then pat your feet and the areas between your toes until they are completely dry. Do not soak your feet as this can dry your skin.  Apply a moisturizing lotion or petroleum jelly (that does not contain alcohol and is unscented) to the skin on your feet and to dry, brittle toenails. Do not apply lotion between your toes.  Trim your toenails straight across. Do not dig under them or around the cuticle. File the edges of your nails with an emery board or nail file.  Do not cut corns or calluses or try to remove them with medicine.  Wear clean socks or stockings every day. Make sure they are not too tight. Do not wear knee-high stockings since they may decrease blood flow to your legs.  Wear shoes that fit properly and have enough cushioning. To break in new shoes, wear them for just a few hours a day. This prevents you from injuring your feet. Always look in your shoes before you put them  on to be sure there are no objects inside.  Do not cross your legs. This may decrease the blood flow to your feet.  If you find a minor scrape, cut, or break in the skin on your feet, keep it and the skin around it clean and dry. These areas may be cleansed with mild soap and water. Do not cleanse the area with peroxide, alcohol, or iodine.  When you remove an adhesive bandage, be sure not to damage the skin around it.  If you have a wound, look at it several times a day to make sure it is healing.  Do not use heating pads or hot water bottles. They may burn your skin. If you have lost feeling in your feet or legs, you may not know it is happening until it is too late.  Make sure your health care provider performs a complete foot exam at least annually or more often if you have foot problems. Report any cuts, sores, or bruises to your health care provider immediately. Contact a health care provider if:  You have an injury that is not healing.  You have cuts or breaks in the skin.  You have an ingrown nail.  You notice redness on your legs or feet.  You feel burning or tingling in your legs or feet.  You have pain or cramps in your legs and feet.  Your legs or feet are numb.  Your feet always feel cold. Get help right away if:  There is increasing redness, swelling, or pain in or around a wound.  There is a red line that goes up your leg.  Pus is coming from a wound.  You develop a fever or as directed by your health care provider.  You notice a bad smell coming from an ulcer or wound. This information is not intended to replace advice given to you by your health care provider. Make sure you discuss any questions you have with your health care provider. Document Released: 08/07/2000 Document Revised: 01/16/2016 Document Reviewed: 01/17/2013 Elsevier Interactive Patient Education  2017 [employer].

## 2017-01-19 NOTE — Progress Notes (Signed)
Subjective:    Patient ID: Phillip Jordan, male    DOB: [birth year], 83 y.o.   MRN: [id number]  DOS:  01/19/2017 Type of visit - description : Routine checkup Interval history: DM, good med compliance, no frequent ambulatory CBGs COPD: Currently taking prednisone for a exacerbation, feeling better.  Review of Systems Denies chest pain or abdominal pain. Sometimes he has "calf weakness" is hard for him to describe the feeling but denies calf swelling, pain per se or claudication.   Past Medical History:  Diagnosis Date  . Allergic rhinitis   . Aortic dissection ([place])    f/u by cards - AAA with distal dissection.  [name] CAD (coronary artery disease)    a. s/p MI 1992. b. Prior hx stenting to RCA/Cx. b. 05/2013: [place] s/p DES to distal RCA then staged DES to prox LAD.   [name] COPD with asthma ([place])   . Diabetes mellitus   . Hyperlipidemia   . Lichen planus    Right midline inferior chest, Dr. [name] Sensing  . Shingles     Past Surgical History:  Procedure Laterality Date  . BACK SURGERY  1993  . CARDIAC CATHETERIZATION     left, w/ coronary angiography and left ventriculograpy  . LEFT HEART CATHETERIZATION WITH CORONARY ANGIOGRAM N/A 06/09/2013   Procedure: LEFT HEART CATHETERIZATION WITH CORONARY ANGIOGRAM;  Surgeon: [name], MD;  Location: [facility] CATH LAB;  Service: Cardiovascular;  Laterality: N/A;  . PERCUTANEOUS CORONARY STENT INTERVENTION (PCI-S) N/A 06/12/2013   Procedure: PERCUTANEOUS CORONARY STENT INTERVENTION (PCI-S);  Surgeon: [name], MD;  Location: [facility] CATH LAB;  Service: Cardiovascular;  Laterality: N/A;  prox LAD  . PTCA     w/ placement of drugeluting stent in the distal right coronary artery, and distal Cardiologist: [name]    Social History   Social History  . Marital status: Married    Spouse name: N/A  . Number of children: 3  . Years of education: N/A   Occupational History  . retired    Social History Main Topics  . Smoking  status: Former Smoker    Packs/day: 1.50    Years: 39.00    Types: Cigarettes    Quit date: 08/24/1992  . Smokeless tobacco: Never Used  . Alcohol use No  . Drug use: No  . Sexual activity: Not on file   Other Topics Concern  . Not on file   Social History Narrative   Lives w/ wife    3 kids, 8 [id number]                   Allergies as of 01/19/2017   No Known Allergies     Medication List       Accurate as of 01/19/17  4:18 PM. Always use your most recent med list.          ACAPELLA Misc Use as directed   acetaminophen 500 MG tablet Commonly known as:  TYLENOL Take 1,000 mg by mouth every 6 (six) hours as needed for pain. Reported on 03/09/2016   albuterol 108 (90 Base) MCG/ACT inhaler Commonly known as:  PROAIR HFA 2 puffs every 4 hours as needed only  if your can't catch your breath   aspirin 81 MG tablet Take 1 tablet (81 mg total) by mouth daily.   clopidogrel 75 MG tablet Commonly known as:  PLAVIX Take 1 tablet (75 mg total) by mouth daily.   famotidine 20 MG tablet Commonly known as:  PEPCID TAKE 1 TABLET BY MOUTH AT BEDTIME   glucose blood test strip Commonly known as:  FREESTYLE TEST STRIPS Check blood sugar no more than twice daily   isosorbide mononitrate 30 MG 24 hr tablet Commonly known as:  IMDUR Take 1 tablet (30 mg total) by mouth 2 (two) times daily.   loratadine 10 MG tablet Commonly known as:  CLARITIN Take 10 mg by mouth daily as needed for allergies.   metFORMIN 500 MG tablet Commonly known as:  GLUCOPHAGE Take 1 tablet (500 mg total) by mouth daily with supper.   mometasone-formoterol 200-5 MCG/ACT Aero Commonly known as:  DULERA Inhale 2 puffs into the lungs 2 (two) times daily.   montelukast 10 MG tablet Commonly known as:  SINGULAIR Take 1 tablet (10 mg total) by mouth at bedtime.   multivitamin with minerals Tabs tablet Take 1 tablet by mouth daily.   nitroGLYCERIN 0.4 MG SL tablet Commonly known as:   NITROSTAT Place 1 tablet (0.4 mg total) under the tongue every 5 (five) minutes as needed for chest pain (up to 3 doses only.).   pantoprazole 40 MG tablet Commonly known as:  PROTONIX Take 30- 60 min before your first and last meals of the day   pravastatin 40 MG tablet Commonly known as:  PRAVACHOL Take 1 tablet (40 mg total) by mouth daily.   predniSONE 10 MG tablet Commonly known as:  DELTASONE Take  4 each am x 2 days,   2 each am x 2 days,  1 each am x 2 days and stop   vitamin B-12 1000 MCG tablet Commonly known as:  CYANOCOBALAMIN Take 1,000 mcg by mouth daily.          Objective:   Physical Exam BP 128/76 (BP Location: Left Arm, Patient Position: Sitting, Cuff Size: Normal)   Pulse 66   Temp 98 F (36.7 C) (Oral)   Resp 14   Ht 5\' 9"  (1.753 m)   Wt 173 lb 2 oz (78.5 kg)   SpO2 96%   BMI 25.57 kg/m  General:   Well developed, well nourished . NAD.  HEENT:  Normocephalic . Face symmetric, atraumatic Lungs:  CTA B Normal respiratory effort, no intercostal retractions, no accessory muscle use. Heart: RRR,  no murmur.  No pretibial edema bilaterally  Abdomen: Soft, nontender, no DIABETIC FEET EXAM: No lower extremity edema Normal pedal pulses bilaterally Skin normal, nails normal, no calluses Pinprick examination: Insensitive throughout. Neurologic:  alert & oriented X3.  Speech normal, gait appropriate for age and unassisted Psych--  Cognition and judgment appear intact.  Cooperative with normal attention span and concentration.  Behavior appropriate. No anxious or depressed appearing.      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 Ronnald Ramp Chronic hoarseness, status post ENT eval ~ 2012 Normal DEXA 2012  PLAN DM with  retinopathy and neuropathy: Check BMP, A1c, feet care discussed. Would like to see a new podiatrist, refer him to Dr. Paulla Dolly AAA: Asx, check a ultrasound COPD: Was recently seen with exacerbation by pulmonology, feeling better RTC 4-5 months

## 2017-01-19 NOTE — Progress Notes (Signed)
Pre visit review using our clinic review tool, if applicable. No additional management support is needed unless otherwise documented below in the visit note. 

## 2017-01-20 LAB — BASIC METABOLIC PANEL
BUN: 15 mg/dL (ref 6–23)
CALCIUM: 10.1 mg/dL (ref 8.4–10.5)
CO2: 32 mEq/L (ref 19–32)
CREATININE: 1.07 mg/dL (ref 0.40–1.50)
Chloride: 94 mEq/L — ABNORMAL LOW (ref 96–112)
GFR: 69.86 mL/min (ref >60.00)
Glucose, Bld: 155 mg/dL — ABNORMAL HIGH (ref 70–99)
Potassium: 5.7 mEq/L — ABNORMAL HIGH (ref 3.5–5.1)
Sodium: 131 mEq/L — ABNORMAL LOW (ref 135–145)

## 2017-01-20 LAB — HEMOGLOBIN A1C: HEMOGLOBIN A1C: 6.8 % — AB (ref 4.6–6.5)

## 2017-01-20 NOTE — Addendum Note (Signed)
Addended byDamita Dunnings D on: 01/20/2017 04:26 PM   Modules accepted: Orders

## 2017-01-21 ENCOUNTER — Other Ambulatory Visit (INDEPENDENT_AMBULATORY_CARE_PROVIDER_SITE_OTHER): Payer: PPO

## 2017-01-21 DIAGNOSIS — E875 Hyperkalemia: Secondary | ICD-10-CM | POA: Diagnosis not present

## 2017-01-21 LAB — BASIC METABOLIC PANEL
BUN: 13 mg/dL (ref 6–23)
CALCIUM: 10.2 mg/dL (ref 8.4–10.5)
CO2: 31 mEq/L (ref 19–32)
CREATININE: 1.02 mg/dL (ref 0.40–1.50)
Chloride: 95 mEq/L — ABNORMAL LOW (ref 96–112)
GFR: 73.83 mL/min (ref >60.00)
Glucose, Bld: 138 mg/dL — ABNORMAL HIGH (ref 70–99)
Potassium: 5.1 mEq/L (ref 3.5–5.1)
Sodium: 133 mEq/L — ABNORMAL LOW (ref 135–145)

## 2017-01-29 ENCOUNTER — Encounter: Payer: Self-pay | Admitting: Internal Medicine

## 2017-02-01 ENCOUNTER — Ambulatory Visit (INDEPENDENT_AMBULATORY_CARE_PROVIDER_SITE_OTHER): Payer: PPO | Admitting: Family Medicine

## 2017-02-01 ENCOUNTER — Encounter: Payer: Self-pay | Admitting: Family Medicine

## 2017-02-01 VITALS — BP 142/60 | HR 72 | Temp 97.9°F | Ht 69.0 in | Wt 171.6 lb

## 2017-02-01 DIAGNOSIS — R42 Dizziness and giddiness: Secondary | ICD-10-CM

## 2017-02-01 DIAGNOSIS — R062 Wheezing: Secondary | ICD-10-CM | POA: Diagnosis not present

## 2017-02-01 DIAGNOSIS — R29898 Other symptoms and signs involving the musculoskeletal system: Secondary | ICD-10-CM | POA: Diagnosis not present

## 2017-02-01 LAB — CBC
HEMATOCRIT: 45.2 % (ref 39.0–52.0)
HEMOGLOBIN: 15.4 g/dL (ref 13.0–17.0)
MCHC: 34 g/dL (ref 30.0–36.0)
MCV: 88.4 fl (ref 78.0–100.0)
PLATELETS: 303 10*3/uL (ref 150.0–400.0)
RBC: 5.11 Mil/uL (ref 4.22–5.81)
RDW: 13 % (ref 11.5–15.5)
WBC: 12.9 10*3/uL — AB (ref 4.0–10.5)

## 2017-02-01 LAB — COMPREHENSIVE METABOLIC PANEL
ALT: 19 U/L (ref 0–53)
AST: 17 U/L (ref 0–37)
Albumin: 4.4 g/dL (ref 3.5–5.2)
Alkaline Phosphatase: 93 U/L (ref 39–117)
BUN: 15 mg/dL (ref 6–23)
CHLORIDE: 93 meq/L — AB (ref 96–112)
CO2: 27 mEq/L (ref 19–32)
Calcium: 10.3 mg/dL (ref 8.4–10.5)
Creatinine, Ser: 0.95 mg/dL (ref 0.40–1.50)
GFR: 80.13 mL/min (ref >60.00)
GLUCOSE: 118 mg/dL — AB (ref 70–99)
Potassium: 5.8 mEq/L — ABNORMAL HIGH (ref 3.5–5.1)
Sodium: 130 mEq/L — ABNORMAL LOW (ref 135–145)
TOTAL PROTEIN: 7.4 g/dL (ref 6.0–8.3)
Total Bilirubin: 0.5 mg/dL (ref 0.2–1.2)

## 2017-02-01 LAB — TSH: TSH: 1.87 u[IU]/mL (ref 0.35–4.50)

## 2017-02-01 MED ORDER — IPRATROPIUM-ALBUTEROL 0.5-2.5 (3) MG/3ML IN SOLN
3.0000 mL | Freq: Once | RESPIRATORY_TRACT | Status: AC
  Administered 2017-02-01: 3 mL via RESPIRATORY_TRACT

## 2017-02-01 NOTE — Progress Notes (Signed)
Chief Complaint  Patient presents with  . Follow-up    on weakness,lightheadedness-pt states he feels better during the day-pt states that he just don't feel good.    Subjective: Patient is a 83 y.o. male here for light-headedness.  Over the past 3 weeks, the patient has been experiencing worsening lightheadedness when he stands up, lower extremity weakness, and intermittent nausea. He did recently have a COPD exacerbation that is almost resolved, however he still having some shortness of breath. He maintains that his oral intake of fluids and food is normal. Nobody near him has been sick. He denies any spinning of the room. No numbness, tingling, vision changes, headache, chest pain, fluttering in his chest, cough, abdominal pain, vomiting, diarrhea, constipation, or areas of easy bruising/bleeding.  ROS: Heart: Denies chest pain  Lungs: +SOB   Family History  Problem Relation Age of Onset  . Asthma Mother   . Heart disease Mother        CHF  . Cancer Mother        melanoma  . Heart disease Father        MI  . Prostate cancer Neg Hx   . Colon cancer Neg Hx    Past Medical History:  Diagnosis Date  . Allergic rhinitis   . Aortic dissection ([place])    f/u by cards - AAA with distal dissection.  [name] CAD (coronary artery disease)    a. s/p MI 1992. b. Prior hx stenting to RCA/Cx. b. 05/2013: [place] s/p DES to distal RCA then staged DES to prox LAD.   [name] COPD with asthma ([place])   . Diabetes mellitus   . Hyperlipidemia   . Lichen planus    Right midline inferior chest, Dr. [name] Sensing  . Shingles    No Known Allergies  Current Outpatient Prescriptions:  .  acetaminophen (TYLENOL) 500 MG tablet, Take 1,000 mg by mouth every 6 (six) hours as needed for pain. Reported on 03/09/2016, Disp: , Rfl:  .  albuterol (PROAIR HFA) 108 (90 Base) MCG/ACT inhaler, 2 puffs every 4 hours as needed only  if your can't catch your breath, Disp: 1 Inhaler, Rfl: 6 .  aspirin 81 MG tablet, Take 1 tablet (81  mg total) by mouth daily., Disp: , Rfl:  .  clopidogrel (PLAVIX) 75 MG tablet, Take 1 tablet (75 mg total) by mouth daily., Disp: 90 tablet, Rfl: 1 .  famotidine (PEPCID) 20 MG tablet, TAKE 1 TABLET BY MOUTH AT BEDTIME, Disp: 30 tablet, Rfl: 11 .  glucose blood (FREESTYLE TEST STRIPS) test strip, Check blood sugar no more than twice daily, Disp: 100 each, Rfl: 12 .  isosorbide mononitrate (IMDUR) 30 MG 24 hr tablet, Take 1 tablet (30 mg total) by mouth 2 (two) times daily., Disp: 180 tablet, Rfl: 1 .  loratadine (CLARITIN) 10 MG tablet, Take 10 mg by mouth daily as needed for allergies. , Disp: , Rfl:  .  metFORMIN (GLUCOPHAGE) 500 MG tablet, Take 1 tablet (500 mg total) by mouth daily with supper., Disp: 90 tablet, Rfl: 1 .  Misc. Devices (ACAPELLA) MISC, Use as directed, Disp: 1 each, Rfl: 0 .  mometasone-formoterol (DULERA) 200-5 MCG/ACT AERO, Inhale 2 puffs into the lungs 2 (two) times daily., Disp: 1 Inhaler, Rfl: 0 .  montelukast (SINGULAIR) 10 MG tablet, Take 1 tablet (10 mg total) by mouth at bedtime., Disp: 90 tablet, Rfl: 2 .  Multiple Vitamin (MULTIVITAMIN WITH MINERALS) TABS tablet, Take 1 tablet by mouth daily., Disp: ,  Rfl:  .  nitroGLYCERIN (NITROSTAT) 0.4 MG SL tablet, Place 1 tablet (0.4 mg total) under the tongue every 5 (five) minutes as needed for chest pain (up to 3 doses only.)., Disp: 25 tablet, Rfl: 3 .  pantoprazole (PROTONIX) 40 MG tablet, Take 30- 60 min before your first and last meals of the day, Disp: 60 tablet, Rfl: 11 .  pravastatin (PRAVACHOL) 40 MG tablet, Take 1 tablet (40 mg total) by mouth daily., Disp: 90 tablet, Rfl: 1 .  vitamin B-12 (CYANOCOBALAMIN) 1000 MCG tablet, Take 1,000 mcg by mouth daily., Disp: , Rfl:   Objective: BP (!) 142/60 (BP Location: Left Arm, Patient Position: Sitting, Cuff Size: Normal)   Pulse 72   Temp 97.9 F (36.6 C) (Oral)   Ht 5\' 9"  (1.753 m)   Wt 171 lb 9.6 oz (77.8 kg)   SpO2 97%   BMI 25.34 kg/m  General: Awake, appears  stated age HEENT: MMM, EOMi Heart: RRR, no murmurs, no bruits, no LE edema Lungs: No rales, + diffuse expiratory wheezes, no rhonchi. No accessory muscle use Abd: BS+, soft, NT, ND, no masses or organomegaly Neuro: 2/4 biceps and patellar reflex b/l, 1/4 calcaneal reflex b/l, no clonus, no cerebellar signs MSK: 5/5 strength throughout with exception fo 4/5 L knee flexion and extension (this is not new per the patient) Psych: Age appropriate judgment and insight, normal affect and mood  Assessment and Plan: Lightheaded - Plan: EKG 12-Lead, Comprehensive metabolic panel, CBC, TSH  Weakness of both lower extremities - Plan: Comprehensive metabolic panel, CBC, TSH  Wheezing - Plan: ipratropium-albuterol (DUONEB) 0.5-2.5 (3) MG/3ML nebulizer solution 3 mL  Orders as above. EKG unremarkable other than RBBB and 1st degree AV block. No significant change from previous EKG. Recommended staying well hydrated, particularly with the hot weather as of late. Handout regarding orthostatic hypotension given. If no improvement and if labs are unremarkable, will consider cardiology referral.  F/u with reg PCP if symptoms fail to improve. The patient voiced understanding and agreement to the plan.  [place], DO 02/01/17  1:17 PM

## 2017-02-01 NOTE — Patient Instructions (Addendum)
Give [place] 2-3 business days to get the results of your labs back.   Make sure you stay well hydrated. This could account for all of your symptoms. We will be checking labs in the meantime.  Let us know if anything new arises.  Orthostatic Hypotension Orthostatic hypotension is a sudden drop in blood pressure that happens when you quickly change positions, such as when you get up from a seated or lying position. Blood pressure is a measurement of how strongly, or weakly, your blood is pressing against the walls of your arteries. Arteries are blood vessels that carry blood from your heart throughout your body. When blood pressure is too low, you may not get enough blood to your brain or to the rest of your organs. This can cause weakness, light-headedness, rapid heartbeat, and fainting. This can last for just a few seconds or for up to a few minutes. Orthostatic hypotension is usually not a serious problem. However, if it happens frequently or gets worse, it may be a sign of something more serious. What are the causes? This condition may be caused by:  Sudden changes in posture, such as standing up quickly after you have been sitting or lying down.  Blood loss.  Loss of body fluids (dehydration).  Heart problems.  Hormone (endocrine) problems.  Pregnancy.  Severe infection.  Lack of certain nutrients.  Severe allergic reactions (anaphylaxis).  Certain medicines, such as blood pressure medicine or medicines that make the body lose excess fluids (diuretics). Sometimes, this condition can be caused by not taking medicine as directed, such as taking too much of a certain medicine.  What increases the risk? Certain factors can make you more likely to develop orthostatic hypotension, including:  Age. Risk increases as you get older.  Conditions that affect the heart or the central nervous system.  Taking certain medicines, such as blood pressure medicine or diuretics.  Being  pregnant.  What are the signs or symptoms? Symptoms of this condition may include:  Weakness.  Light-headedness.  Dizziness.  Blurred vision.  Fatigue.  Rapid heartbeat.  Fainting, in severe cases.  How is this diagnosed? This condition is diagnosed based on:  Your medical history.  Your symptoms.  Your blood pressure measurement. Your health care provider will check your blood pressure when you are: ? Lying down. ? Sitting. ? Standing.  A blood pressure reading is recorded as two numbers, such as "120 over 80" (or 120/80). The first ("top") number is called the systolic pressure. It is a measure of the pressure in your arteries as your heart beats. The second ("bottom") number is called the diastolic pressure. It is a measure of the pressure in your arteries when your heart relaxes between beats. Blood pressure is measured in a unit called mm Hg. Healthy blood pressure for adults is 120/80. If your blood pressure is below 90/60, you may be diagnosed with hypotension. Other information or tests that may be used to diagnose orthostatic hypotension include:  Your other vital signs, such as your heart rate and temperature.  Blood tests.  Tilt table test. For this test, you will be safely secured to a table that moves you from a lying position to an upright position. Your heart rhythm and blood pressure will be monitored during the test.  How is this treated? Treatment for this condition may include:  Changing your diet. This may involve eating more salt (sodium) or drinking more water.  Taking medicines to raise your blood pressure.  Changing  the dosage of certain medicines you are taking that might be lowering your blood pressure.  Wearing compression stockings. These stockings help to prevent blood clots and reduce swelling in your legs.  In some cases, you may need to go to the hospital for:  Fluid replacement. This means you will receive fluids through an IV  tube.  Blood replacement. This means you will receive donated blood through an IV tube (transfusion).  Treating an infection or heart problems, if this applies.  Monitoring. You may need to be monitored while medicines that you are taking wear off.  Follow these instructions at home: Eating and drinking   Drink enough fluid to keep your urine clear or pale yellow.  Eat a healthy diet and follow instructions from your health care provider about eating or drinking restrictions. A healthy diet includes: ? Fresh fruits and vegetables. ? Whole grains. ? Lean meats. ? Low-fat dairy products.  Eat extra salt only as directed. Do not add extra salt to your diet unless your health care provider told you to do that.  Eat frequent, small meals.  Avoid standing up suddenly after eating. Medicines  Take over-the-counter and prescription medicines only as told by your health care provider. ? Follow instructions from your health care provider about changing the dosage of your current medicines, if this applies. ? Do not stop or adjust any of your medicines on your own. General instructions  Wear compression stockings as told by your health care provider.  Get up slowly from lying down or sitting positions. This gives your blood pressure a chance to adjust.  Avoid hot showers and excessive heat as directed by your health care provider.  Return to your normal activities as told by your health care provider. Ask your health care provider what activities are safe for you.  Do not use any products that contain nicotine or tobacco, such as cigarettes and e-cigarettes. If you need help quitting, ask your health care provider.  Keep all follow-up visits as told by your health care provider. This is important. Contact a health care provider if:  You vomit.  You have diarrhea.  You have a fever for more than 2-3 days.  You feel more thirsty than usual.  You feel weak and tired. Get help  right away if:  You have chest pain.  You have a fast or irregular heartbeat.  You develop numbness in any part of your body.  You cannot move your arms or your legs.  You have trouble speaking.  You become sweaty or feel lightheaded.  You faint.  You feel short of breath.  You have trouble staying awake.  You feel confused. This information is not intended to replace advice given to you by your health care provider. Make sure you discuss any questions you have with your health care provider. Document Released: 07/31/2002 Document Revised: 04/28/2016 Document Reviewed: 01/31/2016 Elsevier Interactive Patient Education  2018 [employer].

## 2017-02-02 ENCOUNTER — Other Ambulatory Visit: Payer: Self-pay | Admitting: Family Medicine

## 2017-02-02 DIAGNOSIS — E871 Hypo-osmolality and hyponatremia: Secondary | ICD-10-CM

## 2017-02-02 DIAGNOSIS — E875 Hyperkalemia: Secondary | ICD-10-CM

## 2017-02-02 MED ORDER — SODIUM POLYSTYRENE SULFONATE 15 GM/60ML PO SUSP: 15.0000 g | Freq: Every day | ORAL | 0 refills | Status: AC

## 2017-02-02 NOTE — Progress Notes (Unsigned)
Please make sure someone addresses my result note. TY.  I called him and scheduled appt with Dr. [name] tomorrow at 51. Dr. [name] is aware

## 2017-02-03 ENCOUNTER — Ambulatory Visit (INDEPENDENT_AMBULATORY_CARE_PROVIDER_SITE_OTHER): Payer: PPO | Admitting: Internal Medicine

## 2017-02-03 ENCOUNTER — Encounter: Payer: Self-pay | Admitting: Internal Medicine

## 2017-02-03 VITALS — BP 132/78 | HR 76 | Temp 98.0°F | Resp 14 | Ht 69.0 in | Wt 171.0 lb

## 2017-02-03 DIAGNOSIS — E875 Hyperkalemia: Secondary | ICD-10-CM | POA: Diagnosis not present

## 2017-02-03 LAB — BASIC METABOLIC PANEL
BUN: 11 mg/dL (ref 6–23)
CALCIUM: 10.3 mg/dL (ref 8.4–10.5)
CO2: 30 mEq/L (ref 19–32)
Chloride: 95 mEq/L — ABNORMAL LOW (ref 96–112)
Creatinine, Ser: 0.92 mg/dL (ref 0.40–1.50)
GFR: 83.16 mL/min (ref >60.00)
Glucose, Bld: 120 mg/dL — ABNORMAL HIGH (ref 70–99)
POTASSIUM: 5.1 meq/L (ref 3.5–5.1)
Sodium: 133 mEq/L — ABNORMAL LOW (ref 135–145)

## 2017-02-03 LAB — CORTISOL: CORTISOL PLASMA: 8.8 ug/dL

## 2017-02-03 NOTE — Progress Notes (Signed)
Pre visit review using our clinic review tool, if applicable. No additional management support is needed unless otherwise documented below in the visit note. 

## 2017-02-03 NOTE — Patient Instructions (Addendum)
Get your blood work done, will try not to use a tourniquet  Kayexalate : Take a dose tomorrow morning and another the next morning.

## 2017-02-03 NOTE — Progress Notes (Signed)
Subjective:    Patient ID: Phillip Jordan, male    DOB: May 15, 1930, 83 y.o.   MRN: [id number]  DOS:  02/03/2017 Type of visit - description : acute Interval history: Had a COPD exacerbation 01/15/2014 treated by pulmonology w/ prednisone Was seen by me 01/19/2017 he was actually feeling well and routine labs showed a K+ of 5.7, repeated K+ w/o intervention was 5.1. Was seen by Dr. Nani Ravens 02/01/2017 , he felt lightheaded and weak. EKG was unremarkable, symptoms felt to be orthostatic hypotension. Labs show a potassium of 5.8, white blood cells were 12.9. He was recommended to RTC and is here for follow-up.  Review of Systems Today she feels very well, denies palpitations. No nausea, vomiting, diarrhea. No leg cramps. No chest pain or palpitations. Does not use any OTC potassium supplements or salt substitutes  Past Medical History:  Diagnosis Date  . Allergic rhinitis   . Aortic dissection ([place])    f/u by cards - AAA with distal dissection.  [name] CAD (coronary artery disease)    a. s/p MI 1992. b. Prior hx stenting to RCA/Cx. b. 05/2013: [place] s/p DES to distal RCA then staged DES to prox LAD.   [name] COPD with asthma ([place])   . Diabetes mellitus   . Hyperlipidemia   . Lichen planus    Right midline inferior chest, Dr. [name] Sensing  . Shingles     Past Surgical History:  Procedure Laterality Date  . BACK SURGERY  1993  . CARDIAC CATHETERIZATION     left, w/ coronary angiography and left ventriculograpy  . LEFT HEART CATHETERIZATION WITH CORONARY ANGIOGRAM N/A 06/09/2013   Procedure: LEFT HEART CATHETERIZATION WITH CORONARY ANGIOGRAM;  Surgeon: [name], MD;  Location: [facility] CATH LAB;  Service: Cardiovascular;  Laterality: N/A;  . PERCUTANEOUS CORONARY STENT INTERVENTION (PCI-S) N/A 06/12/2013   Procedure: PERCUTANEOUS CORONARY STENT INTERVENTION (PCI-S);  Surgeon: [name], MD;  Location: [facility] CATH LAB;  Service: Cardiovascular;  Laterality: N/A;  prox LAD  . PTCA      w/ placement of drugeluting stent in the distal right coronary artery, and distal Cardiologist: [name]    Social History   Social History  . Marital status: Married    Spouse name: N/A  . Number of children: 3  . Years of education: N/A   Occupational History  . retired    Social History Main Topics  . Smoking status: Former Smoker    Packs/day: 1.50    Years: 39.00    Types: Cigarettes    Quit date: 08/24/1992  . Smokeless tobacco: Never Used  . Alcohol use No  . Drug use: No  . Sexual activity: Not on file   Other Topics Concern  . Not on file   Social History Narrative   Lives w/ wife    3 kids, 49 [id number]                   Allergies as of 02/03/2017   No Known Allergies     Medication List       Accurate as of 02/03/17 11:59 PM. Always use your most recent med list.          ACAPELLA Misc Use as directed   acetaminophen 500 MG tablet Commonly known as:  TYLENOL Take 1,000 mg by mouth every 6 (six) hours as needed for pain. Reported on 03/09/2016   albuterol 108 (90 Base) MCG/ACT inhaler Commonly known as:  PROAIR HFA 2 puffs  every 4 hours as needed only  if your can't catch your breath   aspirin 81 MG tablet Take 1 tablet (81 mg total) by mouth daily.   clopidogrel 75 MG tablet Commonly known as:  PLAVIX Take 1 tablet (75 mg total) by mouth daily.   famotidine 20 MG tablet Commonly known as:  PEPCID TAKE 1 TABLET BY MOUTH AT BEDTIME   glucose blood test strip Commonly known as:  FREESTYLE TEST STRIPS Check blood sugar no more than twice daily   isosorbide mononitrate 30 MG 24 hr tablet Commonly known as:  IMDUR Take 1 tablet (30 mg total) by mouth 2 (two) times daily.   loratadine 10 MG tablet Commonly known as:  CLARITIN Take 10 mg by mouth daily as needed for allergies.   metFORMIN 500 MG tablet Commonly known as:  GLUCOPHAGE Take 1 tablet (500 mg total) by mouth daily with supper.   mometasone-formoterol 200-5  MCG/ACT Aero Commonly known as:  DULERA Inhale 2 puffs into the lungs 2 (two) times daily.   montelukast 10 MG tablet Commonly known as:  SINGULAIR Take 1 tablet (10 mg total) by mouth at bedtime.   multivitamin with minerals Tabs tablet Take 1 tablet by mouth daily.   nitroGLYCERIN 0.4 MG SL tablet Commonly known as:  NITROSTAT Place 1 tablet (0.4 mg total) under the tongue every 5 (five) minutes as needed for chest pain (up to 3 doses only.).   pantoprazole 40 MG tablet Commonly known as:  PROTONIX Take 30- 60 min before your first and last meals of the day   pravastatin 40 MG tablet Commonly known as:  PRAVACHOL Take 1 tablet (40 mg total) by mouth daily.   sodium polystyrene 15 GM/60ML suspension Commonly known as:  KAYEXALATE Take 60 mLs (15 g total) by mouth daily.   vitamin B-12 1000 MCG tablet Commonly known as:  CYANOCOBALAMIN Take 1,000 mcg by mouth daily.          Objective:   Physical Exam BP 132/78 (BP Location: Left Arm, Patient Position: Sitting, Cuff Size: Normal)   Pulse 76   Temp 98 F (36.7 C) (Oral)   Resp 14   Ht 5\' 9"  (1.753 m)   Wt 171 lb (77.6 kg)   SpO2 96%   BMI 25.25 kg/m  General:   Well developed, well nourished . NAD.  HEENT:  Normocephalic . Face symmetric, atraumatic Neurologic:  alert & oriented X3.  Speech normal, gait appropriate for age and unassisted Psych--  Cognition and judgment appear intact.  Cooperative with normal attention span and concentration.  Behavior appropriate. No anxious or depressed appearing.      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 Ronnald Ramp Chronic hoarseness, status post ENT eval ~ 2012 Normal DEXA 2012  PLAN: Hyperkalemia: Etiology unclear,  not taking any salt  substitutes, potassium supplements. He is not on any medications that could trigger the problem. EKG done a couple days ago while the EKG was 5.8 show no EKG changes  Plan: Recheck labs without tourniquet, aldosterone levels, BMP, cortisol and ACTH. Continue with kayexalate: 1 dose tomorrow and another the next day. BMP in 5 days. Further advise w/ results

## 2017-02-04 NOTE — Assessment & Plan Note (Signed)
Hyperkalemia: Etiology unclear,  not taking any salt substitutes, potassium supplements. He is not on any medications that could trigger the problem. EKG done a couple days ago while the EKG was 5.8 show no EKG changes  Plan: Recheck labs without tourniquet, aldosterone levels, BMP, cortisol and ACTH. Continue with kayexalate: 1 dose tomorrow and another the next day. BMP in 5 days. Further advise w/ results

## 2017-02-08 ENCOUNTER — Other Ambulatory Visit (INDEPENDENT_AMBULATORY_CARE_PROVIDER_SITE_OTHER): Payer: PPO

## 2017-02-08 ENCOUNTER — Other Ambulatory Visit: Payer: Self-pay | Admitting: Internal Medicine

## 2017-02-08 ENCOUNTER — Other Ambulatory Visit: Payer: Self-pay | Admitting: Emergency Medicine

## 2017-02-08 DIAGNOSIS — E875 Hyperkalemia: Secondary | ICD-10-CM

## 2017-02-08 LAB — BASIC METABOLIC PANEL
BUN: 12 mg/dL (ref 6–23)
CALCIUM: 9.9 mg/dL (ref 8.4–10.5)
CO2: 31 meq/L (ref 19–32)
CREATININE: 1.01 mg/dL (ref 0.40–1.50)
Chloride: 96 mEq/L (ref 96–112)
GFR: 74.66 mL/min (ref >60.00)
Glucose, Bld: 130 mg/dL — ABNORMAL HIGH (ref 70–99)
Potassium: 4.6 mEq/L (ref 3.5–5.1)
SODIUM: 133 meq/L — AB (ref 135–145)

## 2017-02-08 LAB — ALDOSTERONE + RENIN ACTIVITY W/ RATIO
ALDO / PRA Ratio: 3.3 Ratio (ref 0.9–28.9)
ALDOSTERONE: 4 ng/dL
PRA LC/MS/MS: 1.23 ng/mL/h (ref 0.25–5.82)

## 2017-02-11 ENCOUNTER — Telehealth: Payer: Self-pay | Admitting: Internal Medicine

## 2017-02-11 MED ORDER — PREDNISONE 10 MG PO TABS: ORAL_TABLET | ORAL | 0 refills | Status: DC

## 2017-02-11 NOTE — Telephone Encounter (Signed)
If feels the last set of recs worked then repeat them and return in 2 weeks with all meds in hand  If they didn't work then needs ov >> to go to [facility] or ER to sort out if we have no openings

## 2017-02-11 NOTE — Telephone Encounter (Signed)
Spoke with pt's spouse, Pasty, who states pt developed  increased sob, wheezing, chest tightness and nausea throughout the night last night. Pt has taken proair twice already today with no improvment.  MW please advise. Thanks.  01/15/17 Prednisone 10 mg take  4 each am x 2 days,   2 each am x 2 days,  1 each am x 2 days and stop   Plan A = Automatic = Dulera 200 Take 2 puffs first thing in am and then another 2 puffs about 12 hours later.    Plan B = Backup Only use your albuterol as a rescue medication to be used if you can't catch your breath by resting or doing a relaxed purse lip breathing pattern.  - The less you use it, the better it will work when you need it. - Ok to use the inhaler up to 2 puffs  every 4 hours if you must but call for appointment if use goes up over your usual need - Don't leave home without it !!  (think of it like the spare tire for your car)    Whenever coughing / wheezing/ short of breath >  Take protonix Take 30- 60 min before your first and last meals of the day    Please remember to go to the xray department downstairs in the basement  for your tests - we will call you with the results when they are available.   If you are satisfied with your treatment plan,  let your doctor know and he/she can either refill your medications or you can return here when your prescription runs out.     If in any way you are not 100% satisfied,  please tell us.  If 100% better, tell your friends!  Pulmonary follow up is as needed

## 2017-02-11 NOTE — Telephone Encounter (Signed)
Pt's spouse and pt are both aware of MW's recommendations.  Pt states last set of recommendations did help. Rx for prednisone has been sent to preferred pharmacy. Pt has been scheduled for OV with MW on 03-12-17, as this was first available. I have advised pt to contact us if sx do not improve with abx.   MR please advise if this apt is okay or this pt needs to be scheduled sooner?

## 2017-02-11 NOTE — Telephone Encounter (Signed)
Spoke with patient- aware that MW is fine with 03-12-17 appt scheduled. Nothing more needed at this time.

## 2017-02-11 NOTE — Telephone Encounter (Signed)
That is fine then

## 2017-02-15 LAB — ACTH: C206 ACTH: 21 pg/mL (ref 6–50)

## 2017-02-16 NOTE — Addendum Note (Signed)
Addended byDamita Dunnings D on: 02/16/2017 08:28 AM   Modules accepted: Orders

## 2017-02-17 ENCOUNTER — Ambulatory Visit (HOSPITAL_COMMUNITY)
Admission: RE | Admit: 2017-02-17 | Discharge: 2017-02-17 | Disposition: A | Discharge status: Home / Self Care | Payer: PPO | Source: Ambulatory Visit | Attending: Internal Medicine | Admitting: Internal Medicine

## 2017-02-17 DIAGNOSIS — I714 Abdominal aortic aneurysm, without rupture: Secondary | ICD-10-CM | POA: Diagnosis not present

## 2017-02-25 ENCOUNTER — Other Ambulatory Visit: Payer: Self-pay | Admitting: Internal Medicine

## 2017-03-04 NOTE — Progress Notes (Addendum)
Subjective:   Phillip Jordan is a 83 y.o. male who presents for Medicare Annual (Subsequent) preventive examination.  Review of Systems:  No ROS.  Medicare Wellness Visit. Additional risk factors are reflected in the social history.  Cardiac Risk Factors include: advanced age (>44men, >59 women);diabetes mellitus;dyslipidemia;male gender;sedentary lifestyle Sleep patterns:  Wakes 1-2x/ night to urinate. Goes back to sleep easily. Sleeps 7-8 hrs. Naps occasionally. Home Safety/Smoke Alarms: Feels safe in home. Smoke alarms in place.  Living environment; residence and Firearm Safety: Lives with wife. No guns. No stairs.  Seat Belt Safety/Bike Helmet: Wears seat belt.   Counseling:   Eye Exam- Wearing glasses. Dr.Lyles yearly. Hx cataract sx Dental- every 6 months. Also has partials.  Male:   CCS-  No longer doing routine screening due to age. PSA-  Lab Results  Component Value Date   PSA 0.73 04/24/2010   PSA 0.79 04/23/2009   PSA 0.89 06/30/2007       Objective:     Vitals: BP 140/68 (BP Location: Right Arm, Patient Position: Sitting, Cuff Size: Normal)   Pulse 68   Ht 5\' 9"  (1.753 m)   Wt 169 lb 6.4 oz (76.8 kg)   SpO2 98%   BMI 25.02 kg/m   Body mass index is 25.02 kg/m.   Tobacco History  Smoking Status  . Former Smoker  . Packs/day: 1.50  . Years: 39.00  . Types: Cigarettes  . Quit date: 08/24/1992  Smokeless Tobacco  . Never Used     Counseling given: Not Answered   Past Medical History:  Diagnosis Date  . Allergic rhinitis   . Aortic dissection ([place])    f/u by cards - AAA with distal dissection.  [name] CAD (coronary artery disease)    a. s/p MI 1992. b. Prior hx stenting to RCA/Cx. b. 05/2013: [place] s/p DES to distal RCA then staged DES to prox LAD.   [name] COPD with asthma ([place])   . Diabetes mellitus   . Hyperlipidemia   . Lichen planus    Right midline inferior chest, Dr. [name] Sensing  . Shingles    Past Surgical History:  Procedure Laterality  Date  . BACK SURGERY  1993  . CARDIAC CATHETERIZATION     left, w/ coronary angiography and left ventriculograpy  . LEFT HEART CATHETERIZATION WITH CORONARY ANGIOGRAM N/A 06/09/2013   Procedure: LEFT HEART CATHETERIZATION WITH CORONARY ANGIOGRAM;  Surgeon: [name], MD;  Location: [facility] CATH LAB;  Service: Cardiovascular;  Laterality: N/A;  . PERCUTANEOUS CORONARY STENT INTERVENTION (PCI-S) N/A 06/12/2013   Procedure: PERCUTANEOUS CORONARY STENT INTERVENTION (PCI-S);  Surgeon: [name], MD;  Location: [facility] CATH LAB;  Service: Cardiovascular;  Laterality: N/A;  prox LAD  . PTCA     w/ placement of drugeluting stent in the distal right coronary artery, and distal Cardiologist: [name]   Family History  Problem Relation Age of Onset  . Asthma Mother   . Heart disease Mother        CHF  . Cancer Mother        melanoma  . Heart disease Father        MI  . Prostate cancer Neg Hx   . Colon cancer Neg Hx    History  Sexual Activity  . Sexual activity: Not on file    Outpatient Encounter Prescriptions as of 03/08/2017  Medication Sig  . acetaminophen (TYLENOL) 500 MG tablet Take 1,000 mg by mouth every 6 (six) hours as needed  for pain. Reported on 03/09/2016  . albuterol (PROAIR HFA) 108 (90 Base) MCG/ACT inhaler 2 puffs every 4 hours as needed only  if your can't catch your breath  . aspirin 81 MG tablet Take 1 tablet (81 mg total) by mouth daily.  . clopidogrel (PLAVIX) 75 MG tablet Take 1 tablet (75 mg total) by mouth daily.  . famotidine (PEPCID) 20 MG tablet TAKE 1 TABLET BY MOUTH AT BEDTIME  . glucose blood (FREESTYLE TEST STRIPS) test strip Check blood sugar no more than twice daily  . isosorbide mononitrate (IMDUR) 30 MG 24 hr tablet Take 1 tablet (30 mg total) by mouth 2 (two) times daily.  . metFORMIN (GLUCOPHAGE) 500 MG tablet Take 1 tablet (500 mg total) by mouth daily with supper.  . Misc. Devices (ACAPELLA) MISC Use as directed  .  mometasone-formoterol (DULERA) 200-5 MCG/ACT AERO Inhale 2 puffs into the lungs 2 (two) times daily.  . montelukast (SINGULAIR) 10 MG tablet Take 1 tablet (10 mg total) by mouth at bedtime.  . Multiple Vitamin (MULTIVITAMIN WITH MINERALS) TABS tablet Take 1 tablet by mouth daily.  . nitroGLYCERIN (NITROSTAT) 0.4 MG SL tablet Place 1 tablet (0.4 mg total) under the tongue every 5 (five) minutes as needed for chest pain (up to 3 doses only.).  [name] pantoprazole (PROTONIX) 40 MG tablet Take 30- 60 min before your first and last meals of the day  . pravastatin (PRAVACHOL) 40 MG tablet Take 1 tablet (40 mg total) by mouth daily.  . vitamin B-12 (CYANOCOBALAMIN) 1000 MCG tablet Take 1,000 mcg by mouth daily.  [name] loratadine (CLARITIN) 10 MG tablet Take 10 mg by mouth daily as needed for allergies.   . [DISCONTINUED] pantoprazole (PROTONIX) 40 MG tablet TAKE ONE TABLET BY MOUTH ONCE DAILY **TAKE  30-60  MINUTES  BEFORE  THE  FIRST  MEAL  OF  THE  DAY**  . [DISCONTINUED] predniSONE (DELTASONE) 10 MG tablet 4 tabs x 2 days, 2 tabs x 2 days, 1 tab x 2 days then stop.   No facility-administered encounter medications on file as of 03/08/2017.     Activities of Daily Living In your present state of health, do you have any difficulty performing the following activities: 03/08/2017  Hearing? Y  Vision? N  Difficulty concentrating or making decisions? N  Walking or climbing stairs? Y  Dressing or bathing? N  Doing errands, shopping? N  Preparing Food and eating ? N  Using the Toilet? N  In the past six months, have you accidently leaked urine? N  Do you have problems with loss of bowel control? N  Managing your Medications? N  Managing your Finances? N  Housekeeping or managing your Housekeeping? N  Some recent data might be hidden    Patient Care Team: Colon Branch, MD as PCP - [name], MD as Consulting Physician (Dermatology) [name], MD as Consulting Physician (Ophthalmology) [name], MD as Consulting Physician (Pulmonary Disease) [name], MD as Consulting Physician (Cardiology) [name], DPM as Consulting Physician (Podiatry)    Assessment:    Physical assessment deferred to PCP.  Exercise Activities and Dietary recommendations Current Exercise Habits: The patient does not participate in regular exercise at present, Exercise limited by: respiratory conditions(s) Diet (meal preparation, eat out, water intake, caffeinated beverages, dairy products, fruits and vegetables): well balanced  Goals    . Patient Stated          No declines in health. Continue to  serve God and country.      Fall Risk Fall Risk  03/08/2017 03/04/2016 01/03/2016 12/12/2015 05/02/2015  Falls in the past year? No No No No No  Risk for fall due to : - History of fall(s) - - -   Depression Screen PHQ 2/9 Scores 03/08/2017 03/19/2016 03/04/2016 01/03/2016  PHQ - 2 Score 0 0 0 0  Exception Documentation - - - -     Cognitive Function MMSE - Mini Mental State Exam 03/08/2017 03/04/2016  Orientation to time 5 5  Orientation to Place 5 5  Registration 3 3  Attention/ Calculation 2 5  Recall 2 3  Language- name 2 objects 2 2  Language- repeat 1 1  Language- follow 3 step command 3 3  Language- read & follow direction 1 1  Write a sentence 1 1  Copy design 1 1  Total score 26 30        Immunization History  Administered Date(s) Administered  . Influenza Split 05/26/2011, 06/01/2012  . Influenza Whole 06/30/2007, 06/18/2008, 06/14/2009, 04/24/2010  . Influenza, High Dose Seasonal PF 06/19/2014, 05/02/2015  . Influenza,inj,Quad PF,36+ Mos 06/10/2013  . Influenza-Unspecified 05/21/2016  . Pneumococcal Conjugate-13 09/10/2015  . Pneumococcal Polysaccharide-23 08/24/2001, 06/30/2007, 06/01/2012  . Td 08/24/2005, 07/06/2016  . Zoster 10/13/2007   Screening Tests Health Maintenance  Topic Date Due  . INFLUENZA VACCINE  03/24/2017  . OPHTHALMOLOGY EXAM  06/08/2017    . URINE MICROALBUMIN  07/06/2017  . HEMOGLOBIN A1C  07/22/2017  . FOOT EXAM  01/19/2018  . TETANUS/TDAP  07/06/2026  . PNA vac Low Risk Adult  Completed      Plan:   Follow up with PCP as directed.  Continue to eat heart healthy diet (full of fruits, vegetables, whole grains, lean protein, water--limit salt, fat, and sugar intake) and increase physical activity as tolerated.  Continue doing brain stimulating activities (puzzles, reading, adult coloring books, staying active) to keep memory sharp.   I have personally reviewed and noted the following in the patient's chart:   . Medical and social history . Use of alcohol, tobacco or illicit drugs  . Current medications and supplements . Functional ability and status . Nutritional status . Physical activity . Advanced directives . List of other physicians . Hospitalizations, surgeries, and ER visits in previous 12 months . Vitals . Screenings to include cognitive, depression, and falls . Referrals and appointments  In addition, I have reviewed and discussed with patient certain preventive protocols, quality metrics, and best practice recommendations. A written personalized care plan for preventive services as well as general preventive health recommendations were provided to patient.     Naaman Plummer Aguilar, [place]  03/08/2017   Kathlene November, MD

## 2017-03-08 ENCOUNTER — Encounter: Payer: Self-pay | Admitting: *Deleted

## 2017-03-08 ENCOUNTER — Ambulatory Visit (INDEPENDENT_AMBULATORY_CARE_PROVIDER_SITE_OTHER): Payer: PPO | Admitting: *Deleted

## 2017-03-08 ENCOUNTER — Ambulatory Visit: Payer: PPO | Admitting: *Deleted

## 2017-03-08 VITALS — BP 140/68 | HR 68 | Ht 69.0 in | Wt 169.4 lb

## 2017-03-08 DIAGNOSIS — Z0000 Encounter for general adult medical examination without abnormal findings: Secondary | ICD-10-CM

## 2017-03-08 NOTE — Patient Instructions (Addendum)
Phillip Jordan , Thank you for taking time to come for your Medicare Wellness Visit. I appreciate your ongoing commitment to your health goals. Please review the following plan we discussed and let me know if I can assist you in the future.   These are the goals we discussed: Goals    . Patient Stated          No declines in health. Continue to serve God and country.       This is a list of the screening recommended for you and due dates:  Health Maintenance  Topic Date Due  . Flu Shot  03/24/2017  . Eye exam for diabetics  06/08/2017  . Urine Protein Check  07/06/2017  . Hemoglobin A1C  07/22/2017  . Complete foot exam   01/19/2018  . Tetanus Vaccine  07/06/2026  . Pneumonia vaccines  Completed   Continue to eat heart healthy diet (full of fruits, vegetables, whole grains, lean protein, water--limit salt, fat, and sugar intake) and increase physical activity as tolerated.  Continue doing brain stimulating activities (puzzles, reading, adult coloring books, staying active) to keep memory sharp.    Health Maintenance, Male A healthy lifestyle and preventive care is important for your health and wellness. Ask your health care provider about what schedule of regular examinations is right for you. What should I know about weight and diet? Eat a Healthy Diet  Eat plenty of vegetables, fruits, whole grains, low-fat dairy products, and lean protein.  Do not eat a lot of foods high in solid fats, added sugars, or salt.  Maintain a Healthy Weight Regular exercise can help you achieve or maintain a healthy weight. You should:  Do at least 150 minutes of exercise each week. The exercise should increase your heart rate and make you sweat (moderate-intensity exercise).  Do strength-training exercises at least twice a week.  Watch Your Levels of Cholesterol and Blood Lipids  Have your blood tested for lipids and cholesterol every 5 years starting at 81 years of age. If you are at high  risk for heart disease, you should start having your blood tested when you are 81 years old. You may need to have your cholesterol levels checked more often if: ? Your lipid or cholesterol levels are high. ? You are older than 81 years of age. ? You are at high risk for heart disease.  What should I know about cancer screening? Many types of cancers can be detected early and may often be prevented. Lung Cancer  You should be screened every year for lung cancer if: ? You are a current smoker who has smoked for at least 30 years. ? You are a former smoker who has quit within the past 15 years.  Talk to your health care provider about your screening options, when you should start screening, and how often you should be screened.  Colorectal Cancer  Routine colorectal cancer screening usually begins at 81 years of age and should be repeated every 5-10 years until you are 81 years old. You may need to be screened more often if early forms of precancerous polyps or small growths are found. Your health care provider may recommend screening at an earlier age if you have risk factors for colon cancer.  Your health care provider may recommend using home test kits to check for hidden blood in the stool.  A small camera at the end of a tube can be used to examine your colon (sigmoidoscopy or colonoscopy). This  checks for the earliest forms of colorectal cancer.  Prostate and Testicular Cancer  Depending on your age and overall health, your health care provider may do certain tests to screen for prostate and testicular cancer.  Talk to your health care provider about any symptoms or concerns you have about testicular or prostate cancer.  Skin Cancer  Check your skin from head to toe regularly.  Tell your health care provider about any new moles or changes in moles, especially if: ? There is a change in a mole's size, shape, or color. ? You have a mole that is larger than a pencil  eraser.  Always use sunscreen. Apply sunscreen liberally and repeat throughout the day.  Protect yourself by wearing long sleeves, pants, a wide-brimmed hat, and sunglasses when outside.  What should I know about heart disease, diabetes, and high blood pressure?  If you are 45-24 years of age, have your blood pressure checked every 3-5 years. If you are 61 years of age or older, have your blood pressure checked every year. You should have your blood pressure measured twice-once when you are at a hospital or clinic, and once when you are not at a hospital or clinic. Record the average of the two measurements. To check your blood pressure when you are not at a hospital or clinic, you can use: ? An automated blood pressure machine at a pharmacy. ? A home blood pressure monitor.  Talk to your health care provider about your target blood pressure.  If you are between 26-69 years old, ask your health care provider if you should take aspirin to prevent heart disease.  Have regular diabetes screenings by checking your fasting blood sugar level. ? If you are at a normal weight and have a low risk for diabetes, have this test once every three years after the age of 67. ? If you are overweight and have a high risk for diabetes, consider being tested at a younger age or more often.  A one-time screening for abdominal aortic aneurysm (AAA) by ultrasound is recommended for men aged 59-75 years who are current or former smokers. What should I know about preventing infection? Hepatitis B If you have a higher risk for hepatitis B, you should be screened for this virus. Talk with your health care provider to find out if you are at risk for hepatitis B infection. Hepatitis C Blood testing is recommended for:  Everyone born from [birth year] through 1963.  Anyone with known risk factors for hepatitis C.  Sexually Transmitted Diseases (STDs)  You should be screened each year for STDs including gonorrhea and  chlamydia if: ? You are sexually active and are younger than 81 years of age. ? You are older than 81 years of age and your health care provider tells you that you are at risk for this type of infection. ? Your sexual activity has changed since you were last screened and you are at an increased risk for chlamydia or gonorrhea. Ask your health care provider if you are at risk.  Talk with your health care provider about whether you are at high risk of being infected with HIV. Your health care provider may recommend a prescription medicine to help prevent HIV infection.  What else can I do?  Schedule regular health, dental, and eye exams.  Stay current with your vaccines (immunizations).  Do not use any tobacco products, such as cigarettes, chewing tobacco, and e-cigarettes. If you need help quitting, ask your health care provider.  Limit alcohol intake to no more than 2 drinks per day. One drink equals 12 ounces of beer, 5 ounces of wine, or 1 ounces of hard liquor.  Do not use street drugs.  Do not share needles.  Ask your health care provider for help if you need support or information about quitting drugs.  Tell your health care provider if you often feel depressed.  Tell your health care provider if you have ever been abused or do not feel safe at home. This information is not intended to replace advice given to you by your health care provider. Make sure you discuss any questions you have with your health care provider. Document Released: 02/06/2008 Document Revised: 04/08/2016 Document Reviewed: 05/14/2015 Elsevier Interactive Patient Education  [employer].

## 2017-03-09 ENCOUNTER — Telehealth: Payer: Self-pay | Admitting: Internal Medicine

## 2017-03-09 NOTE — Telephone Encounter (Signed)
No - should def keep appt

## 2017-03-09 NOTE — Telephone Encounter (Signed)
Spoke with pt, who states his symptoms have improved with prednisone. Pt states breathing is doing well and he has no concerns at this time. Appointment for 03/12/17 was made on 02/11/17, when pt called in reporting of increased sob & wheezing. Pt is wanting to know if MW thinks it would be okay to cancel scheduled apt for 7/20, due to breathing doing well at this time.  MW please advise. Thanks.   01/15/17 Prednisone 10 mg take  4 each am x 2 days,   2 each am x 2 days,  1 each am x 2 days and stop   Plan A = Automatic = Dulera 200 Take 2 puffs first thing in am and then another 2 puffs about 12 hours later.    Plan B = Backup Only use your albuterol as a rescue medication to be used if you can't catch your breath by resting or doing a relaxed purse lip breathing pattern.  - The less you use it, the better it will work when you need it. - Ok to use the inhaler up to 2 puffs  every 4 hours if you must but call for appointment if use goes up over your usual need - Don't leave home without it !!  (think of it like the spare tire for your car)    Whenever coughing / wheezing/ short of breath >  Take protonix Take 30- 60 min before your first and last meals of the day    Please remember to go to the xray department downstairs in the basement  for your tests - we will call you with the results when they are available.   If you are satisfied with your treatment plan,  let your doctor know and he/she can either refill your medications or you can return here when your prescription runs out.     If in any way you are not 100% satisfied,  please tell us.  If 100% better, tell your friends!  Pulmonary follow up is as needed

## 2017-03-09 NOTE — Telephone Encounter (Signed)
Spoke with pt,aware of recs.  Nothing further needed.  

## 2017-03-10 DIAGNOSIS — D225 Melanocytic nevi of trunk: Secondary | ICD-10-CM | POA: Diagnosis not present

## 2017-03-10 DIAGNOSIS — D692 Other nonthrombocytopenic purpura: Secondary | ICD-10-CM | POA: Diagnosis not present

## 2017-03-10 DIAGNOSIS — L821 Other seborrheic keratosis: Secondary | ICD-10-CM | POA: Diagnosis not present

## 2017-03-10 DIAGNOSIS — Z85828 Personal history of other malignant neoplasm of skin: Secondary | ICD-10-CM | POA: Diagnosis not present

## 2017-03-10 DIAGNOSIS — L570 Actinic keratosis: Secondary | ICD-10-CM | POA: Diagnosis not present

## 2017-03-10 DIAGNOSIS — D1801 Hemangioma of skin and subcutaneous tissue: Secondary | ICD-10-CM | POA: Diagnosis not present

## 2017-03-10 DIAGNOSIS — D2371 Other benign neoplasm of skin of right lower limb, including hip: Secondary | ICD-10-CM | POA: Diagnosis not present

## 2017-03-12 ENCOUNTER — Encounter: Payer: Self-pay | Admitting: Internal Medicine

## 2017-03-12 ENCOUNTER — Ambulatory Visit (INDEPENDENT_AMBULATORY_CARE_PROVIDER_SITE_OTHER): Payer: PPO | Admitting: Internal Medicine

## 2017-03-12 VITALS — BP 130/60 | HR 80 | Ht 69.0 in | Wt 170.0 lb

## 2017-03-12 DIAGNOSIS — J449 Chronic obstructive pulmonary disease, unspecified: Secondary | ICD-10-CM | POA: Diagnosis not present

## 2017-03-12 MED ORDER — PREDNISONE 10 MG PO TABS: ORAL_TABLET | ORAL | 5 refills | Status: DC

## 2017-03-12 NOTE — Patient Instructions (Addendum)
   Plan A = Automatic = Dulera 200 Take 2 puffs first thing in am and then another 2 puffs about 12 hours later Also  Pantoprazole (protonix) 40 mg   Take  30-60 min before first meal of the day and Pepcid (famotidine)  20 mg one @  bedtime until return to office - this is the best way to tell whether stomach acid is contributing to your problem.     Plan B = Backup Only use your albuterol as a rescue medication to be used if you can't catch your breath by resting or doing a relaxed purse lip breathing pattern.  - The less you use it, the better it will work when you need it. - Ok to use the inhaler up to 2 puffs  every 4 hours if you must but call for appointment if use goes up over your usual need - Don't leave home without it !!  (think of it like the spare tire for your car)    Whenever coughing / wheezing/ short of breath >  Take protonix Take 30- 60 min before your first and last meals of the day and once you are better reduce back to protonix 40 mg Take 30-60 min before first meal of the day    Plan C= Crisis If needing the albuterol (proair) more than a few times a week> Prednisone 10 mg take  4 each am x 2 days,   2 each am x 2 days,  1 each am x 2 days and stop   Please schedule a follow up visit in 3 months but call sooner if needed

## 2017-03-12 NOTE — Assessment & Plan Note (Addendum)
-   PFT's 07/02/11  FEV1  1.41 (55%) ratio 66 and 12% better p B2 with DLCO 96% - PFTs  07/05/2013 FEV1  1.05 and 1.31 p B2 (24%) with DLCO 59 corrects to 84%  - Spiriva and other mdi result in hoarseness - hfa 05/25/2013 75% p coaching>  90% 07/05/13  - added gerd rx 05/25/2013 >> improved 07/05/13  - rx dulera 200 2bid 07/05/13 > better 10/03/2013   - 10/08/2015 added GERD rx back due to pm strangling/ hoarseness  - 10/25/2015 added flutter - Rehab completed Aug 2017  - PFT's  05/21/2016  FEV1 1.23 (50 % ) ratio 45  p 32 % improvement from saba p dulera 200 prior to study with DLCO  56/57 % corrects to 72  % for alv volume    - 03/12/2017  After extensive coaching HFA effectiveness =    90%  - added pred x 6 days as backup plan prn as of 03/12/2017     Much better p pred rx despite good hfa and adherence to dulera 200 so rec add prn pred to rx with other option to add  LAMA smi if needing pred more than rarely   Each maintenance medication was reviewed in detail including most importantly the difference between maintenance and as needed and under what circumstances the prns are to be used.  Please see AVS for specific  Instructions which are unique to this visit and I personally typed out  which were reviewed in detail in writing with the patient and a copy provided.

## 2017-03-12 NOTE — Progress Notes (Signed)
Subjective:     Patient ID: Phillip Jordan, male    DOB: 1930/03/30   MRN: [id number]    Brief patient profile:  83  year old male, ex-smoker (1ppd x 30 years, quit 1990s), diagnosed with COPD per hx by Dr [name] (fev1 1.38L/44%, ratio 51 in Jan 2009). C/w GOLD III     History of Present Illness  05/25/2013 consult/ [name] re: sob/ prev eval MR with GOLD III COPD Chief Complaint  Patient presents with  . Pulmonary Consult    Referred per Dr. Kathlene November. The pt c/o DOE for the past 15 years, but worse for the past 6 months. He states that he gets SOB with walking up hills, but does okay with his exercise program 2-3 times per wk. He also c/o hoarseness and cough "for a long time"- cough is prod in the am with minimal clear sputum.      All his days are about the same in terms of activity tolerance  Breathing got better p quit smoking then worse again starting around mid 1990s Better on dulera, no better on spiriva ? Worse hoarseness so stopped it Work on inhaler technique:    Pantoprazole (protonix) 40 mg   Take 30-60 min before first meal of the day and Pepcid 20 mg one bedtime until return to office - this is the best way to tell whether stomach acid is contributing to your problem.  GERD diet    11/30/2016  f/u ov/[name] re:  GOLD III COPD / dulera 200 bid and singulair/ did not start spiriva as rec by [name]  Chief Complaint  Patient presents with  . Follow-up    Increased wheezing and hoarseness for the past wk. He is not coughing much.  He uses albuterol inhaler 1 x daily on average.   no noct symptoms, others hear the wheezing but he does not notice it  Only uses saba when wife tells him to and never disturbs his sleep rec Change the protonix to Take 30- 60 min before your first and last meals of the day until no wheeze then ok to just take once a day 30 min before first meal Only use your albuterol as a rescue medication  GERD diet    01/15/2017 acute extended ov/[name] re: ? aecopd  with underlying GOLD III severity maint rx dulera 200/ singulair  Chief Complaint  Patient presents with  . Acute Visit    Pt c/o increased SOB for the past 3 wks. He also c/o rhinits. He has a non prod cough and wheezing. He is using proair 2 x daily on average.    gradually worse x 3 weeks with cough /wheeze worse after wakes and stirs / sleeping ok  Did not increase ppi to bid as prev rec with  [place], not using saba up to 2 q4h either  Doe= MMRC3 = can't walk 100 yards even at a slow pace at a flat grade s stopping due to sob   rec Prednisone 10 mg take  4 each am x 2 days,   2 each am x 2 days,  1 each am x 2 days and stop  Plan A = Automatic = Dulera 200 Take 2 puffs first thing in am and then another 2 puffs about 12 hours later.  Plan B = Backup Only use your albuterol as a rescue medication Whenever coughing / wheezing/ short of breath >  Take protonix Take 30- 60 min before your first and last meals  of the day     03/12/2017  f/u ov/[name] re:   COPD  III dulera 200 / singulair/ gerd rx  Chief Complaint  Patient presents with  . Follow-up    Breathing has improved back to his normal baseline. No new co's. He has not had to use rescue inhaler.    doe still = mmrc3   No obvious day to day or daytime variability or assoc excess/ purulent sputum or mucus plugs or hemoptysis or cp or chest tightness, subjective wheeze or overt sinus or hb symptoms. No unusual exp hx or h/o childhood pna/ asthma or knowledge of premature birth.  Sleeping ok without nocturnal  or early am exacerbation  of respiratory  c/o's or need for noct saba. Also denies any obvious fluctuation of symptoms with weather or environmental changes or other aggravating or alleviating factors except as outlined above   Current Medications, Allergies, Complete Past Medical History, Past Surgical History, Family History, and Social History were reviewed in [employer] record.  ROS  The following are  not active complaints unless bolded sore throat, dysphagia, dental problems, itching, sneezing,  nasal congestion or excess/ purulent secretions, ear ache,   fever, chills, sweats, unintended wt loss, classically pleuritic or exertional cp,  orthopnea pnd or leg swelling, presyncope, palpitations, abdominal pain, anorexia, nausea, vomiting, diarrhea  or change in bowel or bladder habits, change in stools or urine, dysuria,hematuria,  rash, arthralgias, visual complaints, headache, numbness, weakness or ataxia or problems with walking or coordination,  change in mood/affect or memory.               Objective:   Physical Exam  amb robust wm nad mod hoarse - vital signs reviewed .- Note on arrival 02 sats  95% on RA    03/12/2017         170  01/15/2017         172  11/30/2016           175  10/03/2013        175  > 10/08/2015  176 >  10/21/2015 173 > 10/25/2015   173 > 11/08/2015 170 > 05/21/2016  172 > 07/13/2016 176  07/05/13 176 lb (79.833 kg)  06/26/13 173 lb 12.8 oz (78.835 kg)  06/16/13 174 lb (78.926 kg)        HEENT: nl dentition  and oropharynx. Nl external ear canals without cough reflex- - mild/ bilateral non-specific turbinate edema     NECK :  without JVD/Nodes/TM/ nl carotid upstrokes bilaterally   LUNGS: no acc muscle use,  slt barrel contour chest- scattered - distant  bs no wheeze       CV:  RRR  no s3 or murmur or increase in P2, no edema   ABD:  soft and nontender with nl inspiratory excursion in the supine position. No bruits or organomegaly, bowel sounds nl  MS:  Nl gait/ ext warm without deformities, calf tenderness, cyanosis or clubbing No obvious joint restrictions   SKIN: warm and dry without lesions    NEURO:  alert, approp, nl sensorium with  no motor deficits             Assessment & Plan:

## 2017-03-26 ENCOUNTER — Telehealth: Payer: Self-pay | Admitting: Internal Medicine

## 2017-03-26 NOTE — Telephone Encounter (Signed)
Due to check a BMP, please enter a order for next week, DX hyperlipidemia. Advise lab not to use a tourniquet

## 2017-03-26 NOTE — Telephone Encounter (Signed)
MyChart message sent to Pt.

## 2017-03-31 ENCOUNTER — Other Ambulatory Visit (INDEPENDENT_AMBULATORY_CARE_PROVIDER_SITE_OTHER): Payer: PPO

## 2017-03-31 DIAGNOSIS — E875 Hyperkalemia: Secondary | ICD-10-CM | POA: Diagnosis not present

## 2017-03-31 LAB — BASIC METABOLIC PANEL
BUN: 13 mg/dL (ref 6–23)
CHLORIDE: 97 meq/L (ref 96–112)
CO2: 32 meq/L (ref 19–32)
Calcium: 9.6 mg/dL (ref 8.4–10.5)
Creatinine, Ser: 0.99 mg/dL (ref 0.40–1.50)
GFR: 76.38 mL/min (ref >60.00)
Glucose, Bld: 181 mg/dL — ABNORMAL HIGH (ref 70–99)
POTASSIUM: 4.5 meq/L (ref 3.5–5.1)
SODIUM: 133 meq/L — AB (ref 135–145)

## 2017-04-07 ENCOUNTER — Ambulatory Visit (INDEPENDENT_AMBULATORY_CARE_PROVIDER_SITE_OTHER): Payer: PPO | Admitting: Podiatry

## 2017-04-07 ENCOUNTER — Encounter: Payer: Self-pay | Admitting: Podiatry

## 2017-04-07 DIAGNOSIS — E119 Type 2 diabetes mellitus without complications: Secondary | ICD-10-CM | POA: Diagnosis not present

## 2017-04-07 DIAGNOSIS — M79676 Pain in unspecified toe(s): Secondary | ICD-10-CM | POA: Diagnosis not present

## 2017-04-07 DIAGNOSIS — B351 Tinea unguium: Secondary | ICD-10-CM | POA: Diagnosis not present

## 2017-04-07 DIAGNOSIS — Q828 Other specified congenital malformations of skin: Secondary | ICD-10-CM | POA: Diagnosis not present

## 2017-04-07 NOTE — Progress Notes (Signed)
Patient ID: JANDIEL MAGALLANES, male   DOB: June 22, 1930, 83 y.o.   MRN: [id number] Complaint:  Visit Type: Patient returns to my office for continued preventative foot care services. Complaint: Patient states" my nails have grown long and thick and become painful to walk and wear shoes" Patient has been diagnosed with DM with no foot complications. The patient presents for preventative foot care services. No changes to ROS.  He has been under treatment for COPD.  Patient has callus under the ball of right foot and outside right foot.  Podiatric Exam: Vascular: dorsalis pedis and posterior tibial pulses are palpable bilateral. Capillary return is immediate. Temperature gradient is WNL. Skin turgor WNL  Sensorium: Normal Semmes Weinstein monofilament test except toes both feet.. Normal tactile sensation bilaterally. Nail Exam: Pt has thick disfigured discolored nails with subungual debris noted bilateral entire nail hallux through fifth toenails Ulcer Exam: There is no evidence of ulcer or pre-ulcerative changes or infection. Orthopedic Exam: Muscle tone and strength are WNL. No limitations in general ROM. No crepitus or effusions noted. Foot type and digits show no abnormalities. Bony prominences are unremarkable. Skin: Porokeratosis sub 1,.5 right foot. Hemmorhagic porokeratosis noted. No infection or ulcers  Diagnosis:  Onychomycosis, , Pain in right toe, pain in left toes. Porokeratosis  Right foot  Treatment & Plan Procedures and Treatment: Consent by patient was obtained for treatment procedures. The patient understood the discussion of treatment and procedures well. All questions were answered thoroughly reviewed. Debridement of mycotic and hypertrophic toenails, 1 through 5 bilateral and clearing of subungual debris. No ulceration, no infection noted. Debride porokeratosis right foot. Return Visit-Office Procedure: Patient instructed to return to the office for a follow up visit 3 months for  continued evaluation and treatment.    [name] DPM

## 2017-04-15 ENCOUNTER — Other Ambulatory Visit: Payer: Self-pay | Admitting: Internal Medicine

## 2017-04-22 ENCOUNTER — Other Ambulatory Visit: Payer: Self-pay | Admitting: Internal Medicine

## 2017-04-23 NOTE — Telephone Encounter (Signed)
Faxed Montelukast/thx dmf

## 2017-05-10 ENCOUNTER — Other Ambulatory Visit: Payer: Self-pay | Admitting: Internal Medicine

## 2017-05-18 ENCOUNTER — Other Ambulatory Visit: Payer: Self-pay | Admitting: Internal Medicine

## 2017-06-08 ENCOUNTER — Ambulatory Visit (INDEPENDENT_AMBULATORY_CARE_PROVIDER_SITE_OTHER): Payer: PPO | Admitting: Internal Medicine

## 2017-06-08 ENCOUNTER — Encounter: Payer: Self-pay | Admitting: Internal Medicine

## 2017-06-08 VITALS — BP 122/70 | HR 73 | Temp 97.6°F | Resp 14 | Ht 69.0 in | Wt 171.1 lb

## 2017-06-08 DIAGNOSIS — E11319 Type 2 diabetes mellitus with unspecified diabetic retinopathy without macular edema: Secondary | ICD-10-CM

## 2017-06-08 DIAGNOSIS — E875 Hyperkalemia: Secondary | ICD-10-CM

## 2017-06-08 DIAGNOSIS — Z23 Encounter for immunization: Secondary | ICD-10-CM | POA: Diagnosis not present

## 2017-06-08 LAB — BASIC METABOLIC PANEL
BUN: 17 mg/dL (ref 6–23)
CALCIUM: 9.8 mg/dL (ref 8.4–10.5)
CO2: 29 mEq/L (ref 19–32)
CREATININE: 0.97 mg/dL (ref 0.40–1.50)
Chloride: 98 mEq/L (ref 96–112)
GFR: 78.17 mL/min (ref >60.00)
GLUCOSE: 121 mg/dL — AB (ref 70–99)
POTASSIUM: 4.8 meq/L (ref 3.5–5.1)
Sodium: 136 mEq/L (ref 135–145)

## 2017-06-08 LAB — HEMOGLOBIN A1C: Hgb A1c MFr Bld: 6.7 % — ABNORMAL HIGH (ref 4.6–6.5)

## 2017-06-08 NOTE — Assessment & Plan Note (Signed)
Hyperkalemia: Since the last visit, cortisol and ACTH came back normal. Hyperkalemia resolved. Check a BMP DM: On metformin, check A1c CAD: Asx Flu shot today RTC 09-2017 CPX

## 2017-06-08 NOTE — Progress Notes (Signed)
Pre visit review using our clinic review tool, if applicable. No additional management support is needed unless otherwise documented below in the visit note. 

## 2017-06-08 NOTE — Progress Notes (Signed)
Subjective:    Patient ID: Phillip Jordan, male    DOB: Apr 07, 1930, 83 y.o.   MRN: [id number]  DOS:  06/08/2017 Type of visit - description : f/u Interval history: Since last visit, he is feeling well. Good compliance with medications Ambulatory CBGs are check from time to time in the morning and they are usually in the low 100s.  Review of Systems Denies chest pain or difficulty breathing No nausea, vomiting, diarrhea  Past Medical History:  Diagnosis Date  . Allergic rhinitis   . Aortic dissection ([place])    f/u by cards - AAA with distal dissection.  [name] CAD (coronary artery disease)    a. s/p MI 1992. b. Prior hx stenting to RCA/Cx. b. 05/2013: [place] s/p DES to distal RCA then staged DES to prox LAD.   [name] COPD with asthma ([place])   . Diabetes mellitus   . Hyperlipidemia   . Lichen planus    Right midline inferior chest, Dr. [name] Sensing  . Shingles     Past Surgical History:  Procedure Laterality Date  . BACK SURGERY  1993  . CARDIAC CATHETERIZATION     left, w/ coronary angiography and left ventriculograpy  . LEFT HEART CATHETERIZATION WITH CORONARY ANGIOGRAM N/A 06/09/2013   Procedure: LEFT HEART CATHETERIZATION WITH CORONARY ANGIOGRAM;  Surgeon: [name], MD;  Location: [facility] CATH LAB;  Service: Cardiovascular;  Laterality: N/A;  . PERCUTANEOUS CORONARY STENT INTERVENTION (PCI-S) N/A 06/12/2013   Procedure: PERCUTANEOUS CORONARY STENT INTERVENTION (PCI-S);  Surgeon: [name], MD;  Location: [facility] CATH LAB;  Service: Cardiovascular;  Laterality: N/A;  prox LAD  . PTCA     w/ placement of drugeluting stent in the distal right coronary artery, and distal Cardiologist: [name]    Social History   Social History  . Marital status: Married    Spouse name: N/A  . Number of children: 3  . Years of education: N/A   Occupational History  . retired    Social History Main Topics  . Smoking status: Former Smoker    Packs/day: 1.50    Years: 39.00      Types: Cigarettes    Quit date: 08/24/1992  . Smokeless tobacco: Never Used  . Alcohol use No  . Drug use: No  . Sexual activity: Not on file   Other Topics Concern  . Not on file   Social History Narrative   Lives w/ wife    3 kids, 66 Gk-GGK      Allergies as of 06/08/2017   No Known Allergies     Medication List       Accurate as of 06/08/17  5:58 PM. Always use your most recent med list.          ACAPELLA Misc Use as directed   aspirin 81 MG tablet Take 1 tablet (81 mg total) by mouth daily.   clopidogrel 75 MG tablet Commonly known as:  PLAVIX Take 1 tablet (75 mg total) by mouth daily.   famotidine 20 MG tablet Commonly known as:  PEPCID TAKE 1 TABLET BY MOUTH AT BEDTIME   glucose blood test strip Commonly known as:  FREESTYLE TEST STRIPS Check blood sugar no more than twice daily   isosorbide mononitrate 30 MG 24 hr tablet Commonly known as:  IMDUR Take 1 tablet (30 mg total) by mouth 2 (two) times daily.   metFORMIN 500 MG tablet Commonly known as:  GLUCOPHAGE Take 1 tablet (500 mg total) by mouth  daily with supper.   mometasone-formoterol 200-5 MCG/ACT Aero Commonly known as:  DULERA Inhale 2 puffs into the lungs 2 (two) times daily.   montelukast 10 MG tablet Commonly known as:  SINGULAIR TAKE ONE TABLET BY MOUTH AT BEDTIME   multivitamin with minerals Tabs tablet Take 1 tablet by mouth daily.   nitroGLYCERIN 0.4 MG SL tablet Commonly known as:  NITROSTAT Place 1 tablet (0.4 mg total) under the tongue every 5 (five) minutes as needed for chest pain (up to 3 doses only.).   pantoprazole 40 MG tablet Commonly known as:  PROTONIX Take 30- 60 min before your first and last meals of the day   pravastatin 40 MG tablet Commonly known as:  PRAVACHOL Take 1 tablet (40 mg total) by mouth daily.   PROAIR HFA 108 (90 Base) MCG/ACT inhaler Generic drug:  albuterol Inhale 1-2 puffs into the lungs every 4 (four) hours as needed for wheezing  or shortness of breath.          Objective:   Physical Exam BP 122/70 (BP Location: Left Arm, Patient Position: Sitting, Cuff Size: Small)   Pulse 73   Temp 97.6 F (36.4 C) (Oral)   Resp 14   Ht 5\' 9"  (1.753 m)   Wt 171 lb 2 oz (77.6 kg)   SpO2 94%   BMI 25.27 kg/m  General:   Well developed, well nourished . NAD.  HEENT:  Normocephalic . Face symmetric, atraumatic Lungs:  CTA B Normal respiratory effort, no intercostal retractions, no accessory muscle use. Heart: RRR,  no murmur.  No pretibial edema bilaterally  Skin: Not pale. Not jaundice Neurologic:  alert & oriented X3.  Speech normal, gait appropriate for age and unassisted Psych--  Cognition and judgment appear intact.  Cooperative with normal attention span and concentration.  Behavior appropriate. No anxious or depressed appearing.      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 Ronnald Ramp Chronic hoarseness, status post ENT eval ~ 2012 Normal DEXA 2012  PLAN: Hyperkalemia: Since the last visit, cortisol and ACTH came back normal. Hyperkalemia resolved. Check a BMP DM: On metformin, check A1c CAD: Asx Flu shot today RTC 09-2017 CPX

## 2017-06-08 NOTE — Patient Instructions (Addendum)
GO TO THE LAB : Get the blood work     GO TO THE FRONT DESK Schedule your next appointment for a  physical exam 09-2017 , fasting

## 2017-06-09 DIAGNOSIS — H52203 Unspecified astigmatism, bilateral: Secondary | ICD-10-CM | POA: Diagnosis not present

## 2017-06-09 DIAGNOSIS — Z961 Presence of intraocular lens: Secondary | ICD-10-CM | POA: Diagnosis not present

## 2017-06-09 DIAGNOSIS — E113291 Type 2 diabetes mellitus with mild nonproliferative diabetic retinopathy without macular edema, right eye: Secondary | ICD-10-CM | POA: Diagnosis not present

## 2017-06-09 LAB — HM DIABETES EYE EXAM

## 2017-06-11 ENCOUNTER — Telehealth: Payer: Self-pay | Admitting: *Deleted

## 2017-06-11 NOTE — Telephone Encounter (Signed)
Received results from [facility]; forwarded to provider/SLS 10/19

## 2017-06-14 ENCOUNTER — Ambulatory Visit (INDEPENDENT_AMBULATORY_CARE_PROVIDER_SITE_OTHER): Payer: PPO | Admitting: Internal Medicine

## 2017-06-14 ENCOUNTER — Encounter: Payer: Self-pay | Admitting: Internal Medicine

## 2017-06-14 VITALS — BP 130/70 | HR 61 | Ht 69.0 in | Wt 173.0 lb

## 2017-06-14 DIAGNOSIS — J328 Other chronic sinusitis: Secondary | ICD-10-CM | POA: Diagnosis not present

## 2017-06-14 DIAGNOSIS — J449 Chronic obstructive pulmonary disease, unspecified: Secondary | ICD-10-CM | POA: Diagnosis not present

## 2017-06-14 NOTE — Assessment & Plan Note (Addendum)
-   PFT's 07/02/11  FEV1  1.41 (55%) ratio 66 and 12% better p B2 with DLCO 96% - PFTs  07/05/2013 FEV1  1.05 and 1.31 p B2 (24%) with DLCO 59 corrects to 84%  - Spiriva and other mdi result in hoarseness - hfa 05/25/2013 75% p coaching>  90% 07/05/13  - added gerd rx 05/25/2013 >> improved 07/05/13  - rx dulera 200 2bid 07/05/13 > better 10/03/2013   - 10/08/2015 added GERD rx back due to pm strangling/ hoarseness  - 10/25/2015 added flutter - Rehab completed Aug 2017  - PFT's  05/21/2016  FEV1 1.23 (50 % ) ratio 45  p 32 % improvement from saba p dulera 200 prior to study with DLCO  56/57 % corrects to 72  % for alv volume   - 03/12/2017  After extensive coaching HFA effectiveness =    90%  - added pred x 6 days as backup plan prn as of 03/12/2017    Def improved vs prior visits in terms of best day function and now [facility] = 2 vs 3 prior   No clear really needs singulair at this point so ok to try off (see chronic sinusitis also)   I had an extended discussion with the patient reviewing all relevant studies completed to date and  lasting 15 to 20 minutes of a 25 minute visit    Each maintenance medication was reviewed in detail including most importantly the difference between maintenance and prns and under what circumstances the prns are to be triggered using an action plan format that is not reflected in the computer generated alphabetically organized AVS.    Please see AVS for specific instructions unique to this visit that I personally wrote and verbalized to the the pt in detail and then reviewed with pt  by my nurse highlighting any  changes in therapy recommended at today's visit to their plan of care.

## 2017-06-14 NOTE — Progress Notes (Signed)
Subjective:     Patient ID: Phillip Jordan, male    DOB: 1930/03/30   MRN: [id number]    Brief patient profile:  83  year old male, ex-smoker (1ppd x 30 years, quit 1990s), diagnosed with COPD per hx by Dr [name] (fev1 1.38L/44%, ratio 51 in Jan 2009). C/w GOLD III     History of Present Illness  05/25/2013 consult/ [name] re: sob/ prev eval MR with GOLD III COPD Chief Complaint  Patient presents with  . Pulmonary Consult    Referred per Dr. Kathlene November. The pt c/o DOE for the past 15 years, but worse for the past 6 months. He states that he gets SOB with walking up hills, but does okay with his exercise program 2-3 times per wk. He also c/o hoarseness and cough "for a long time"- cough is prod in the am with minimal clear sputum.      All his days are about the same in terms of activity tolerance  Breathing got better p quit smoking then worse again starting around mid 1990s Better on dulera, no better on spiriva ? Worse hoarseness so stopped it Work on inhaler technique:    Pantoprazole (protonix) 40 mg   Take 30-60 min before first meal of the day and Pepcid 20 mg one bedtime until return to office - this is the best way to tell whether stomach acid is contributing to your problem.  GERD diet    11/30/2016  f/u ov/Phillip Jordan re:  GOLD III COPD / dulera 200 bid and singulair/ did not start spiriva as rec by [name]  Chief Complaint  Patient presents with  . Follow-up    Increased wheezing and hoarseness for the past wk. He is not coughing much.  He uses albuterol inhaler 1 x daily on average.   no noct symptoms, others hear the wheezing but he does not notice it  Only uses saba when wife tells him to and never disturbs his sleep rec Change the protonix to Take 30- 60 min before your first and last meals of the day until no wheeze then ok to just take once a day 30 min before first meal Only use your albuterol as a rescue medication  GERD diet    01/15/2017 acute extended ov/Phillip Jordan re: ? aecopd  with underlying GOLD III severity maint rx dulera 200/ singulair  Chief Complaint  Patient presents with  . Acute Visit    Pt c/o increased SOB for the past 3 wks. He also c/o rhinits. He has a non prod cough and wheezing. He is using proair 2 x daily on average.    gradually worse x 3 weeks with cough /wheeze worse after wakes and stirs / sleeping ok  Did not increase ppi to bid as prev rec with  Flares, not using saba up to 2 q4h either  Doe= MMRC3 = can't walk 100 yards even at a slow pace at a flat grade s stopping due to sob   rec Prednisone 10 mg take  4 each am x 2 days,   2 each am x 2 days,  1 each am x 2 days and stop  Plan A = Automatic = Dulera 200 Take 2 puffs first thing in am and then another 2 puffs about 12 hours later.  Plan B = Backup Only use your albuterol as a rescue medication Whenever coughing / wheezing/ short of breath >  Take protonix Take 30- 60 min before your first and last meals  of the day     03/12/2017  f/u ov/Phillip Jordan re:   COPD  III dulera 200 / singulair/ gerd rx  Chief Complaint  Patient presents with  . Follow-up    Breathing has improved back to his normal baseline. No new co's. He has not had to use rescue inhaler.    doe still = mmrc3  rec Plan A = Automatic = Dulera 200 Take 2 puffs first thing in am and then another 2 puffs about 12 hours later Also  Pantoprazole (protonix) 40 mg   Take  30-60 min before first meal of the day and Pepcid (famotidine)  20 mg one @  bedtime until return to office - this is the best way to tell whether stomach acid is contributing to your problem.   Plan B = Backup Only use your albuterol as a rescue medication to be used if you can't catch your breath by resting or doing a relaxed purse lip breathing pattern.  - The less you use it, the better it will work when you need it. - Ok to use the inhaler up to 2 puffs  every 4 hours if you must but call for appointment if use goes up over your usual need - Don't leave home  without it !!  (think of it like the spare tire for your car)  Whenever coughing / wheezing/ short of breath >  Take protonix Take 30- 60 min before your first and last meals of the day and once you are better reduce back to protonix 40 mg Take 30-60 min before first meal of the day  Plan C= Crisis If needing the albuterol (proair) more than a few times a week> Prednisone 10 mg take  4 each am x 2 days,   2 each am x 2 days,  1 each am x 2 days and stop     06/14/2017  f/u ov/Phillip Jordan re: copd III on dulera 200/ singulair/ gerd rx/ pred prn used x one cycle since last ov Chief Complaint  Patient presents with  . Follow-up    He states his breathing is doing well. He rarely uses his albuterol inhaler.    doe improved to  [facility] = can't walk a nl pace on a flat grade s sob but does fine slow and flat eg does fine walking slow anywhere and also [facility]  Sleeps ok  Watery rhinitis worse p meals / not sure singulair helping  No obvious other patterns in day to day or daytime variability or assoc excess/ purulent sputum or mucus plugs or hemoptysis or cp or chest tightness, subjective wheeze or overt   hb symptoms. No unusual exp hx or h/o childhood pna/ asthma or knowledge of premature birth.  Sleeping ok flat without nocturnal  or early am exacerbation  of respiratory  c/o's or need for noct saba. Also denies any obvious fluctuation of symptoms with weather or environmental changes or other aggravating or alleviating factors except as outlined above   Current Allergies, Complete Past Medical History, Past Surgical History, Family History, and Social History were reviewed in [employer] record.  ROS  The following are not active complaints unless bolded Hoarseness, sore throat, dysphagia, dental problems, itching, sneezing,  nasal congestion or discharge of excess mucus or purulent secretions, ear ache,   fever, chills, sweats, unintended wt loss or wt gain,  classically pleuritic or exertional cp,  orthopnea pnd or leg swelling, presyncope, palpitations, abdominal  pain, anorexia, nausea, vomiting, diarrhea  or change in bowel habits or change in bladder habits, change in stools or change in urine, dysuria, hematuria,  rash, arthralgias, visual complaints, headache, numbness, weakness or ataxia or problems with walking or coordination,  change in mood/affect or memory.        Current Meds  Medication Sig  . albuterol (PROAIR HFA) 108 (90 Base) MCG/ACT inhaler Inhale 1-2 puffs into the lungs every 4 (four) hours as needed for wheezing or shortness of breath.  [name] aspirin 81 MG tablet Take 1 tablet (81 mg total) by mouth daily.  . clopidogrel (PLAVIX) 75 MG tablet Take 1 tablet (75 mg total) by mouth daily.  . famotidine (PEPCID) 20 MG tablet TAKE 1 TABLET BY MOUTH AT BEDTIME  . glucose blood (FREESTYLE TEST STRIPS) test strip Check blood sugar no more than twice daily  . isosorbide mononitrate (IMDUR) 30 MG 24 hr tablet Take 1 tablet (30 mg total) by mouth 2 (two) times daily.  . metFORMIN (GLUCOPHAGE) 500 MG tablet Take 1 tablet (500 mg total) by mouth daily with supper.  . Misc. Devices (ACAPELLA) MISC Use as directed  . mometasone-formoterol (DULERA) 200-5 MCG/ACT AERO Inhale 2 puffs into the lungs 2 (two) times daily.  . montelukast (SINGULAIR) 10 MG tablet TAKE ONE TABLET BY MOUTH AT BEDTIME  . Multiple Vitamin (MULTIVITAMIN WITH MINERALS) TABS tablet Take 1 tablet by mouth daily.  . nitroGLYCERIN (NITROSTAT) 0.4 MG SL tablet Place 1 tablet (0.4 mg total) under the tongue every 5 (five) minutes as needed for chest pain (up to 3 doses only.).  [name] pantoprazole (PROTONIX) 40 MG tablet Take 30- 60 min before your first and last meals of the day  . pravastatin (PRAVACHOL) 40 MG tablet Take 1 tablet (40 mg total) by mouth daily.                       Objective:   Physical Exam  amb robust wm nad  vital signs reviewed .- Note on arrival 02  sats  96% on RA     06/14/2017       173  03/12/2017         170  01/15/2017         172  11/30/2016           175  10/03/2013        175  > 10/08/2015  176 >  10/21/2015 173 > 10/25/2015   173 > 11/08/2015 170 > 05/21/2016  172 > 07/13/2016 176  07/05/13 176 lb (79.833 kg)  06/26/13 173 lb 12.8 oz (78.835 kg)  06/16/13 174 lb (78.926 kg)        HEENT: nl dentition  and oropharynx. Nl external ear canals without cough reflex- - mild bilateral non-specific turbinate edema     NECK :  without JVD/Nodes/TM/ nl carotid upstrokes bilaterally   LUNGS: no acc muscle use,  slt barrel contour chest- scattered - distant  bs with faint mid exp wheeze bilaterally better with plm       CV:  RRR  no s3 or murmur or increase in P2, no edema   ABD:  soft and nontender with nl inspiratory excursion in the supine position. No bruits or organomegaly, bowel sounds nl  MS:  Nl gait/ ext warm without deformities, calf tenderness, cyanosis or clubbing No obvious joint restrictions   SKIN: warm and dry without lesions    NEURO:  alert, approp, nl  sensorium with  no motor deficits         I personally reviewed images and agree with radiology impression as follows:  CXR:   01/15/17 No acute cardiopulmonary disease. Stable COPD/ emphysema and stable interstitial pulmonary fibrosis involving the lung bases, left greater than right.       Assessment & Plan:

## 2017-06-14 NOTE — Assessment & Plan Note (Signed)
Referred to ent 11/08/2015 >  Seen 11/27/15 by Redmond Baseman, rec conservative f/u prn as symptoms resolved prior to ov  - try off singulair /06/14/2017   Present symptoms are more c/ w vasomotor rhinitis so ok for trial off singulair then add atrovent ns prn if flares in   A non-atopic setting   see avs for instructions unique to this ov

## 2017-06-14 NOTE — Patient Instructions (Addendum)
Ok to try off singulair to see if worse nasal or lower respiratory symptoms and if so restart  Please schedule a follow up visit in  6  months but call sooner if needed

## 2017-06-15 NOTE — Progress Notes (Signed)
Patient ID: Phillip Jordan, male   DOB: 1930/03/25, 83 y.o.   MRN: [id number] 83 y.o. male with history of CAD who was admitted 10/14 with unstable angina. Found to have severe stenoses in the RCA and the proximal to mid LAD. The RCA stenosis was treated with a DES. The LAD had TIME-2 flow and a 95-99% lesion with plaque starting in the proximal vessel and extending into the mid vessel. The PCI of the LAD was staged..   Not having chest pain.  Compliant with meds including plavix.     Significant COPD Gold 3 followed by pulmonary  Stopped smoking in 1994  Seen by pulmonary 06/14/17 with no changes  He thinks he is doing well with baseline dyspnea and no chest pain  Complains of legs hurting rest/exercise   ROS: Denies fever, malais, weight loss, blurry vision, decreased visual acuity, cough, sputum, SOB, hemoptysis, pleuritic pain, palpitaitons, heartburn, abdominal pain, melena, lower extremity edema, claudication, or rash.  All other systems reviewed and negative  General: Affect appropriate Healthy:  appears stated age 83: normal Neck supple with no adenopathy JVP normal no bruits no thyromegaly Lungs chronic rhonchi and exp wheezing and good diaphragmatic motion Heart:  S1/S2 no murmur, no rub, gallop or click PMI normal Abdomen: benighn, BS positve, no tenderness, no AAA no bruit.  No HSM or HJR Distal pulses intact with no bruits No edema Neuro non-focal Skin warm and dry No muscular weakness   Current Outpatient Prescriptions  Medication Sig Dispense Refill  . albuterol (PROAIR HFA) 108 (90 Base) MCG/ACT inhaler Inhale 1-2 puffs into the lungs every 4 (four) hours as needed for wheezing or shortness of breath.    [name] aspirin 81 MG tablet Take 1 tablet (81 mg total) by mouth daily.    . clopidogrel (PLAVIX) 75 MG tablet Take 1 tablet (75 mg total) by mouth daily. 90 tablet 2  . famotidine (PEPCID) 20 MG tablet TAKE 1 TABLET BY MOUTH AT BEDTIME 30 tablet 11  . glucose blood  (FREESTYLE TEST STRIPS) test strip Check blood sugar no more than twice daily 100 each 12  . isosorbide mononitrate (IMDUR) 30 MG 24 hr tablet Take 1 tablet (30 mg total) by mouth 2 (two) times daily. 180 tablet 1  . metFORMIN (GLUCOPHAGE) 500 MG tablet Take 1 tablet (500 mg total) by mouth daily with supper. 90 tablet 0  . Misc. Devices (ACAPELLA) MISC Use as directed 1 each 0  . mometasone-formoterol (DULERA) 200-5 MCG/ACT AERO Inhale 2 puffs into the lungs 2 (two) times daily. 1 Inhaler 0  . Multiple Vitamin (MULTIVITAMIN WITH MINERALS) TABS tablet Take 1 tablet by mouth daily.    . nitroGLYCERIN (NITROSTAT) 0.4 MG SL tablet Place 1 tablet (0.4 mg total) under the tongue every 5 (five) minutes as needed for chest pain (up to 3 doses only.). 25 tablet 3  . pantoprazole (PROTONIX) 40 MG tablet Take 30- 60 min before your first and last meals of the day 60 tablet 11  . pravastatin (PRAVACHOL) 40 MG tablet Take 1 tablet (40 mg total) by mouth daily. 90 tablet 2   No current facility-administered medications for this visit.     Allergies  Patient has no known allergies.  Electrocardiogram:   08/2013 SR RBBB possible old IMI  04/15/15  SR rate 67  PR 210 RBBB   Assessment and Plan CAD: .Stable with no angina and good activity level.  Continue medical Rx  HTN: Well controlled.  Continue current  medications and low sodium Dash type diet.    AAA: upper normal 2.5 cm  No abdominal pain observe  DM:: Discussed low carb diet.  Target hemoglobin A1c is 6.5 or less.  Continue current medications.  Chol:  Cholesterol is at goal.  Continue current dose of statin and diet Rx.  No myalgias or side effects.  F/U  LFT's in 6 months. Lab Results  Component Value Date   LDLCALC 83 10/21/2016   COPD: no active wheezing continue Dulera f/u pulmonary   Leg Pain: doubt significant PVD but will check ABI's         Jenkins Rouge

## 2017-06-18 ENCOUNTER — Encounter: Payer: Self-pay | Admitting: Cardiovascular Disease

## 2017-06-18 ENCOUNTER — Other Ambulatory Visit: Payer: Self-pay

## 2017-06-18 ENCOUNTER — Ambulatory Visit (INDEPENDENT_AMBULATORY_CARE_PROVIDER_SITE_OTHER): Payer: PPO | Admitting: Cardiovascular Disease

## 2017-06-18 VITALS — BP 164/80 | HR 84 | Ht 69.0 in | Wt 176.5 lb

## 2017-06-18 DIAGNOSIS — I2510 Atherosclerotic heart disease of native coronary artery without angina pectoris: Secondary | ICD-10-CM

## 2017-06-18 DIAGNOSIS — I739 Peripheral vascular disease, unspecified: Secondary | ICD-10-CM | POA: Diagnosis not present

## 2017-06-18 MED ORDER — NITROGLYCERIN 0.4 MG SL SUBL: 0.4000 mg | SUBLINGUAL_TABLET | SUBLINGUAL | 5 refills | Status: DC | PRN

## 2017-06-18 NOTE — Patient Instructions (Addendum)
Medication Instructions:  Your physician recommends that you continue on your current medications as directed. Please refer to the Current Medication list given to you today.  Labwork: NONE  Testing/Procedures: Your physician has requested that you have a lower extremity arterial duplex with ABI's.   Follow-Up: Your physician wants you to follow-up in: 6 months with Dr. [name]. You will receive a reminder letter in the mail two months in advance. If you don't receive a letter, please call our office to schedule the follow-up appointment.   If you need a refill on your cardiac medications before your next appointment, please call your pharmacy.

## 2017-06-23 ENCOUNTER — Other Ambulatory Visit: Payer: Self-pay | Admitting: Cardiovascular Disease

## 2017-06-23 DIAGNOSIS — I739 Peripheral vascular disease, unspecified: Secondary | ICD-10-CM

## 2017-06-28 ENCOUNTER — Encounter: Payer: Self-pay | Admitting: Family Medicine

## 2017-06-28 ENCOUNTER — Ambulatory Visit: Payer: PPO | Admitting: Family Medicine

## 2017-06-28 VITALS — BP 150/80 | HR 79 | Temp 97.8°F | Ht 69.0 in | Wt 174.0 lb

## 2017-06-28 DIAGNOSIS — I951 Orthostatic hypotension: Secondary | ICD-10-CM | POA: Diagnosis not present

## 2017-06-28 DIAGNOSIS — R42 Dizziness and giddiness: Secondary | ICD-10-CM

## 2017-06-28 NOTE — Progress Notes (Signed)
Chief Complaint  Patient presents with  . Dizziness    Phillip Jordan is 83 y.o. pt here for dizziness. Here w wife.  Duration: 2 days Pass out? No Spinning? Yes Recent illness/fever? No Headache? No Neurologic signs? No Change in PO intake? Not for food, decreased PO intake of water No injury to head or med changes.  ROS:  Neuro: As noted in HPI Eyes: No vision changes  Past Medical History:  Diagnosis Date  . Allergic rhinitis   . Aortic dissection ([place])    f/u by cards - AAA with distal dissection.  [name] CAD (coronary artery disease)    a. s/p MI 1992. b. Prior hx stenting to RCA/Cx. b. 05/2013: [place] s/p DES to distal RCA then staged DES to prox LAD.   [name] COPD with asthma ([place])   . Diabetes mellitus   . Hyperlipidemia   . Lichen planus    Right midline inferior chest, Dr. [name] Sensing  . Shingles     Family History  Problem Relation Age of Onset  . Asthma Mother   . Heart disease Mother        CHF  . Cancer Mother        melanoma  . Heart disease Father        MI  . Prostate cancer Neg Hx   . Colon cancer Neg Hx     Allergies as of 06/28/2017   No Known Allergies     Medication List        Accurate as of 06/28/17  5:06 PM. Always use your most recent med list.          ACAPELLA Misc Use as directed   aspirin 81 MG tablet Take 1 tablet (81 mg total) by mouth daily.   clopidogrel 75 MG tablet Commonly known as:  PLAVIX Take 1 tablet (75 mg total) by mouth daily.   famotidine 20 MG tablet Commonly known as:  PEPCID TAKE 1 TABLET BY MOUTH AT BEDTIME   glucose blood test strip Commonly known as:  FREESTYLE TEST STRIPS Check blood sugar no more than twice daily   isosorbide mononitrate 30 MG 24 hr tablet Commonly known as:  IMDUR Take 1 tablet (30 mg total) by mouth 2 (two) times daily.   metFORMIN 500 MG tablet Commonly known as:  GLUCOPHAGE Take 1 tablet (500 mg total) by mouth daily with supper.   mometasone-formoterol 200-5 MCG/ACT  Aero Commonly known as:  DULERA Inhale 2 puffs into the lungs 2 (two) times daily.   multivitamin with minerals Tabs tablet Take 1 tablet by mouth daily.   nitroGLYCERIN 0.4 MG SL tablet Commonly known as:  NITROSTAT Place 1 tablet (0.4 mg total) under the tongue every 5 (five) minutes as needed for chest pain (up to 3 doses only.).   pantoprazole 40 MG tablet Commonly known as:  PROTONIX Take 30- 60 min before your first and last meals of the day   pravastatin 40 MG tablet Commonly known as:  PRAVACHOL Take 1 tablet (40 mg total) by mouth daily.   PROAIR HFA 108 (90 Base) MCG/ACT inhaler Generic drug:  albuterol Inhale 1-2 puffs into the lungs every 4 (four) hours as needed for wheezing or shortness of breath.       BP (!) 150/80 (BP Location: Right Arm, Cuff Size: Large)   Pulse 79   Temp 97.8 F (36.6 C) (Oral)   Ht 5\' 9"  (1.753 m)   Wt 174 lb (78.9 kg)  SpO2 96%   BMI 25.70 kg/m  General: Awake, alert, appears stated age Eyes: PERRLA, EOMi Ears: Patent, TM's neg b/l Heart: RRR, no murmurs, no carotid bruits Lungs: CTAB, no accessory muscle use MSK: 5/5 strength throughout, gait normal Neuro: No cerebellar signs, patellar reflex 2/4 b/l wo clonus, calcaneal reflex 1/4 b/l wo clonus, biceps reflex 2/4 b/l wo clonus; Dix-[place] negative b/l. CN 2-12 intact Psych: Age appropriate judgment and insight, normal mood and affect  Dizziness - Plan: EKG 12-Lead, CBC, Comprehensive metabolic panel  Orthostasis  Orders as above. EKG unremarkable. Orthostatics positive from lying to seated. Hx suggestive of orthostasis, will check labs.  He is encouraged to increase his fluid intake F/u prn. Pt and his wife voiced understanding and agreement to the plan.  [place], DO 06/28/17 5:06 PM

## 2017-06-28 NOTE — Progress Notes (Signed)
Pre visit review using our clinic review tool, if applicable. No additional management support is needed unless otherwise documented below in the visit note. 

## 2017-06-28 NOTE — Patient Instructions (Addendum)
Orthostatic Hypotension Orthostatic hypotension is a sudden drop in blood pressure that happens when you quickly change positions, such as when you get up from a seated or lying position. Blood pressure is a measurement of how strongly, or weakly, your blood is pressing against the walls of your arteries. Arteries are blood vessels that carry blood from your heart throughout your body. When blood pressure is too low, you may not get enough blood to your brain or to the rest of your organs. This can cause weakness, light-headedness, rapid heartbeat, and fainting. This can last for just a few seconds or for up to a few minutes. Orthostatic hypotension is usually not a serious problem. However, if it happens frequently or gets worse, it may be a sign of something more serious. What are the causes? This condition may be caused by:  Sudden changes in posture, such as standing up quickly after you have been sitting or lying down.  Blood loss.  Loss of body fluids (dehydration).  Heart problems.  Hormone (endocrine) problems.  Pregnancy.  Severe infection.  Lack of certain nutrients.  Severe allergic reactions (anaphylaxis).  Certain medicines, such as blood pressure medicine or medicines that make the body lose excess fluids (diuretics). Sometimes, this condition can be caused by not taking medicine as directed, such as taking too much of a certain medicine.  What increases the risk? Certain factors can make you more likely to develop orthostatic hypotension, including:  Age. Risk increases as you get older.  Conditions that affect the heart or the central nervous system.  Taking certain medicines, such as blood pressure medicine or diuretics.  Being pregnant.  What are the signs or symptoms? Symptoms of this condition may include:  Weakness.  Light-headedness.  Dizziness.  Blurred vision.  Fatigue.  Rapid heartbeat.  Fainting, in severe cases.  How is this  diagnosed? This condition is diagnosed based on:  Your medical history.  Your symptoms.  Your blood pressure measurement. Your health care provider will check your blood pressure when you are: ? Lying down. ? Sitting. ? Standing.  A blood pressure reading is recorded as two numbers, such as "120 over 80" (or 120/80). The first ("top") number is called the systolic pressure. It is a measure of the pressure in your arteries as your heart beats. The second ("bottom") number is called the diastolic pressure. It is a measure of the pressure in your arteries when your heart relaxes between beats. Blood pressure is measured in a unit called mm Hg. Healthy blood pressure for adults is 120/80. If your blood pressure is below 90/60, you may be diagnosed with hypotension. Other information or tests that may be used to diagnose orthostatic hypotension include:  Your other vital signs, such as your heart rate and temperature.  Blood tests.  Tilt table test. For this test, you will be safely secured to a table that moves you from a lying position to an upright position. Your heart rhythm and blood pressure will be monitored during the test.  How is this treated? Treatment for this condition may include:  Changing your diet. This may involve eating more salt (sodium) or drinking more water.  Taking medicines to raise your blood pressure.  Changing the dosage of certain medicines you are taking that might be lowering your blood pressure.  Wearing compression stockings. These stockings help to prevent blood clots and reduce swelling in your legs.  In some cases, you may need to go to the hospital for:    Fluid replacement. This means you will receive fluids through an IV tube.  Blood replacement. This means you will receive donated blood through an IV tube (transfusion).  Treating an infection or heart problems, if this applies.  Monitoring. You may need to be monitored while medicines that you  are taking wear off.  Follow these instructions at home: Eating and drinking   Drink enough fluid to keep your urine clear or pale yellow.  Eat a healthy diet and follow instructions from your health care provider about eating or drinking restrictions. A healthy diet includes: ? Fresh fruits and vegetables. ? Whole grains. ? Lean meats. ? Low-fat dairy products.  Eat extra salt only as directed. Do not add extra salt to your diet unless your health care provider told you to do that.  Eat frequent, small meals.  Avoid standing up suddenly after eating. Medicines  Take over-the-counter and prescription medicines only as told by your health care provider. ? Follow instructions from your health care provider about changing the dosage of your current medicines, if this applies. ? Do not stop or adjust any of your medicines on your own. General instructions  Wear compression stockings as told by your health care provider.  Get up slowly from lying down or sitting positions. This gives your blood pressure a chance to adjust.  Avoid hot showers and excessive heat as directed by your health care provider.  Return to your normal activities as told by your health care provider. Ask your health care provider what activities are safe for you.  Do not use any products that contain nicotine or tobacco, such as cigarettes and e-cigarettes. If you need help quitting, ask your health care provider.  Keep all follow-up visits as told by your health care provider. This is important. Contact a health care provider if:  You vomit.  You have diarrhea.  You have a fever for more than 2-3 days.  You feel more thirsty than usual.  You feel weak and tired. Get help right away if:  You have chest pain.  You have a fast or irregular heartbeat.  You develop numbness in any part of your body.  You cannot move your arms or your legs.  You have trouble speaking.  You become sweaty or feel  lightheaded.  You faint.  You feel short of breath.  You have trouble staying awake.  You feel confused. This information is not intended to replace advice given to you by your health care provider. Make sure you discuss any questions you have with your health care provider. Document Released: 07/31/2002 Document Revised: 04/28/2016 Document Reviewed: 01/31/2016 Elsevier Interactive Patient Education  2018 [employer]  

## 2017-06-29 LAB — CBC
HCT: 46.6 % (ref 39.0–52.0)
Hemoglobin: 15.7 g/dL (ref 13.0–17.0)
MCHC: 33.7 g/dL (ref 30.0–36.0)
MCV: 90.5 fl (ref 78.0–100.0)
Platelets: 316 K/uL (ref 150.0–400.0)
RBC: 5.15 Mil/uL (ref 4.22–5.81)
RDW: 13.6 % (ref 11.5–15.5)
WBC: 10 K/uL (ref 4.0–10.5)

## 2017-06-29 LAB — COMPREHENSIVE METABOLIC PANEL WITH GFR
ALT: 18 U/L (ref 0–53)
AST: 19 U/L (ref 0–37)
Albumin: 4.4 g/dL (ref 3.5–5.2)
Alkaline Phosphatase: 95 U/L (ref 39–117)
BUN: 13 mg/dL (ref 6–23)
CO2: 32 meq/L (ref 19–32)
Calcium: 10.2 mg/dL (ref 8.4–10.5)
Chloride: 98 meq/L (ref 96–112)
Creatinine, Ser: 0.94 mg/dL (ref 0.40–1.50)
GFR: 81.04 mL/min
Glucose, Bld: 121 mg/dL — ABNORMAL HIGH (ref 70–99)
Potassium: 4.9 meq/L (ref 3.5–5.1)
Sodium: 137 meq/L (ref 135–145)
Total Bilirubin: 0.4 mg/dL (ref 0.2–1.2)
Total Protein: 7.5 g/dL (ref 6.0–8.3)

## 2017-06-30 ENCOUNTER — Telehealth: Payer: Self-pay | Admitting: Internal Medicine

## 2017-06-30 NOTE — Telephone Encounter (Signed)
If he has severe symptoms or dizziness is associated with headache, stroke type of symptoms needs to go to the ER.  Otherwise to schedule a visit to discuss the issue

## 2017-06-30 NOTE — Telephone Encounter (Signed)
Spoke w/ Pt's wife, Patsy, informed of PCP recommendations. Pt is not having headaches w/ dizziness. Appt scheduled tomorrow morning w/ PCP but Pt and Patsy aware to go to ED if he becomes worse.

## 2017-06-30 NOTE — Telephone Encounter (Signed)
Pt seen on 06/28/2017, please advise.

## 2017-06-30 NOTE — Telephone Encounter (Signed)
[phone] Patient was seen by Nani Ravens for dizziness. Patient is still experiencing dizziness & wife would like to speak with [facility] nurse. Please advise if patient should be seen again. Wife states she would prefer to follow up with [facility].

## 2017-07-01 ENCOUNTER — Encounter: Payer: Self-pay | Admitting: Internal Medicine

## 2017-07-01 ENCOUNTER — Ambulatory Visit: Payer: PPO | Admitting: Internal Medicine

## 2017-07-01 VITALS — BP 164/78 | HR 68 | Resp 14 | Ht 69.0 in | Wt 174.0 lb

## 2017-07-01 DIAGNOSIS — R42 Dizziness and giddiness: Secondary | ICD-10-CM

## 2017-07-01 DIAGNOSIS — R030 Elevated blood-pressure reading, without diagnosis of hypertension: Secondary | ICD-10-CM

## 2017-07-01 DIAGNOSIS — H8190 Unspecified disorder of vestibular function, unspecified ear: Secondary | ICD-10-CM

## 2017-07-01 MED ORDER — MECLIZINE HCL 12.5 MG PO TABS: 12.5000 mg | ORAL_TABLET | Freq: Three times a day (TID) | ORAL | 0 refills | Status: DC | PRN

## 2017-07-01 NOTE — Patient Instructions (Addendum)
Rest  Good hydration  antivert as needed, will cause somnolence  Fall prevention! Use a walker or a cane   Physical therapy  Check the  blood pressure every day Be sure your blood pressure is between 110/65 and  135/85.  if it is consistently higher or lower, let me know Call next week with readings        Fall Prevention in the Home Falls can cause injuries and can affect people from all age groups. There are many simple things that you can do to make your home safe and to help prevent falls. What can I do on the outside of my home?  Regularly repair the edges of walkways and driveways and fix any cracks.  Remove high doorway thresholds.  Trim any shrubbery on the main path into your home.  Use bright outdoor lighting.  Clear walkways of debris and clutter, including tools and rocks.  Regularly check that handrails are securely fastened and in good repair. Both sides of any steps should have handrails.  Install guardrails along the edges of any raised decks or porches.  Have leaves, snow, and ice cleared regularly.  Use sand or salt on walkways during winter months.  In the garage, clean up any spills right away, including grease or oil spills. What can I do in the bathroom?  Use night lights.  Install grab bars by the toilet and in the tub and shower. Do not use towel bars as grab bars.  Use non-skid mats or decals on the floor of the tub or shower.  If you need to sit down while you are in the shower, use a plastic, non-slip stool.  Keep the floor dry. Immediately clean up any water that spills on the floor.  Remove soap buildup in the tub or shower on a regular basis.  Attach bath mats securely with double-sided non-slip rug tape.  Remove throw rugs and other tripping hazards from the floor. What can I do in the bedroom?  Use night lights.  Make sure that a bedside light is easy to reach.  Do not use oversized bedding that drapes onto the  floor.  Have a firm chair that has side arms to use for getting dressed.  Remove throw rugs and other tripping hazards from the floor. What can I do in the kitchen?  Clean up any spills right away.  Avoid walking on wet floors.  Place frequently used items in easy-to-reach places.  If you need to reach for something above you, use a sturdy step stool that has a grab bar.  Keep electrical cables out of the way.  Do not use floor polish or wax that makes floors slippery. If you have to use wax, make sure that it is non-skid floor wax.  Remove throw rugs and other tripping hazards from the floor. What can I do in the stairways?  Do not leave any items on the stairs.  Make sure that there are handrails on both sides of the stairs. Fix handrails that are broken or loose. Make sure that handrails are as long as the stairways.  Check any carpeting to make sure that it is firmly attached to the stairs. Fix any carpet that is loose or worn.  Avoid having throw rugs at the top or bottom of stairways, or secure the rugs with carpet tape to prevent them from moving.  Make sure that you have a light switch at the top of the stairs and the bottom of the  stairs. If you do not have them, have them installed. What are some other fall prevention tips?  Wear closed-toe shoes that fit well and support your feet. Wear shoes that have rubber soles or low heels.  When you use a stepladder, make sure that it is completely opened and that the sides are firmly locked. Have someone hold the ladder while you are using it. Do not climb a closed stepladder.  Add color or contrast paint or tape to grab bars and handrails in your home. Place contrasting color strips on the first and last steps.  Use mobility aids as needed, such as canes, walkers, scooters, and crutches.  Turn on lights if it is dark. Replace any light bulbs that burn out.  Set up furniture so that there are clear paths. Keep the  furniture in the same spot.  Fix any uneven floor surfaces.  Choose a carpet design that does not hide the edge of steps of a stairway.  Be aware of any and all pets.  Review your medicines with your healthcare provider. Some medicines can cause dizziness or changes in blood pressure, which increase your risk of falling. Talk with your health care provider about other ways that you can decrease your risk of falls. This may include working with a physical therapist or trainer to improve your strength, balance, and endurance. This information is not intended to replace advice given to you by your health care provider. Make sure you discuss any questions you have with your health care provider. Document Released: 07/31/2002 Document Revised: 01/07/2016 Document Reviewed: 09/14/2014 Elsevier Interactive Patient Education  2017 [employer].

## 2017-07-01 NOTE — Progress Notes (Signed)
Pre visit review using our clinic review tool, if applicable. No additional management support is needed unless otherwise documented below in the visit note. 

## 2017-07-01 NOTE — Progress Notes (Signed)
Subjective:    Patient ID: Phillip Jordan, male    DOB: November 25, 1929, 83 y.o.   MRN: [id number]  DOS:  07/01/2017 Type of visit - description : acute Interval history: Sx started acutely 06/26/2017, he was driving, he turned the car to the left and immediately got dizzy, he was uncomfortable enough that he had to pull over the car. He is dizzy since, unable to walk straight, staggering, has to hold onto furniture. Symptoms are not present if he sits quietly but as soon as he moves, moves his head or rolls in  bed the symptoms come back. Not taking any new medication Was seen recently by my colleague, EKG was unremarkable, mildly orthostatic, CBC and CMP satisfactory.  he was recommended conservative treatment  He is here because has still dizziness. No worse than before  Also, BP has been elevated lately, at home he has been at least one time in the 160s.  Review of Systems Denies CP, palpitation.  Mild nausea x2 with the onset of dizziness No blood in the stools or stomach pain No headache, diplopia, slurred speech or motor deficits. Symptoms are similar or previous inner ear problems.  Past Medical History:  Diagnosis Date  . Allergic rhinitis   . Aortic dissection ([place])    f/u by cards - AAA with distal dissection.  [name] CAD (coronary artery disease)    a. s/p MI 1992. b. Prior hx stenting to RCA/Cx. b. 05/2013: [place] s/p DES to distal RCA then staged DES to prox LAD.   [name] COPD with asthma ([place])   . Diabetes mellitus   . Hyperlipidemia   . Lichen planus    Right midline inferior chest, Dr. [name] Sensing  . Shingles     Past Surgical History:  Procedure Laterality Date  . BACK SURGERY  1993  . CARDIAC CATHETERIZATION     left, w/ coronary angiography and left ventriculograpy  . PTCA     w/ placement of drugeluting stent in the distal right coronary artery, and distal Cardiologist: [name]    Social History   Socioeconomic History  . Marital status: Married   Spouse name: Not on file  . Number of children: 3  . Years of education: Not on file  . Highest education level: Not on file  Social Needs  . Financial resource strain: Not on file  . Food insecurity - worry: Not on file  . Food insecurity - inability: Not on file  . Transportation needs - medical: Not on file  . Transportation needs - non-medical: Not on file  Occupational History  . Occupation: retired  Tobacco Use  . Smoking status: Former Smoker    Packs/day: 1.50    Years: 39.00    Pack years: 58.50    Types: Cigarettes    Last attempt to quit: 08/24/1992    Years since quitting: 24.8  . Smokeless tobacco: Never Used  Substance and Sexual Activity  . Alcohol use: No  . Drug use: No  . Sexual activity: Not on file  Other Topics Concern  . Not on file  Social History Narrative   Lives w/ wife    3 kids, 64 Gk-GGK      Allergies as of 07/01/2017   No Known Allergies     Medication List        Accurate as of 07/01/17 12:40 PM. Always use your most recent med list.          ACAPELLA Misc Use as  directed   aspirin 81 MG tablet Take 1 tablet (81 mg total) by mouth daily.   clopidogrel 75 MG tablet Commonly known as:  PLAVIX Take 1 tablet (75 mg total) by mouth daily.   famotidine 20 MG tablet Commonly known as:  PEPCID TAKE 1 TABLET BY MOUTH AT BEDTIME   glucose blood test strip Commonly known as:  FREESTYLE TEST STRIPS Check blood sugar no more than twice daily   isosorbide mononitrate 30 MG 24 hr tablet Commonly known as:  IMDUR Take 1 tablet (30 mg total) by mouth 2 (two) times daily.   meclizine 12.5 MG tablet Commonly known as:  ANTIVERT Take 1 tablet (12.5 mg total) 3 (three) times daily as needed by mouth for dizziness.   metFORMIN 500 MG tablet Commonly known as:  GLUCOPHAGE Take 1 tablet (500 mg total) by mouth daily with supper.   mometasone-formoterol 200-5 MCG/ACT Aero Commonly known as:  DULERA Inhale 2 puffs into the lungs 2 (two)  times daily.   multivitamin with minerals Tabs tablet Take 1 tablet by mouth daily.   nitroGLYCERIN 0.4 MG SL tablet Commonly known as:  NITROSTAT Place 1 tablet (0.4 mg total) under the tongue every 5 (five) minutes as needed for chest pain (up to 3 doses only.).   pravastatin 40 MG tablet Commonly known as:  PRAVACHOL Take 1 tablet (40 mg total) by mouth daily.   PROAIR HFA 108 (90 Base) MCG/ACT inhaler Generic drug:  albuterol Inhale 1-2 puffs into the lungs every 4 (four) hours as needed for wheezing or shortness of breath.          Objective:   Physical Exam BP (!) 164/78 (BP Location: Left Arm, Patient Position: Sitting, Cuff Size: Small)   Pulse 68   Resp 14   Ht 5\' 9"  (1.753 m)   Wt 174 lb (78.9 kg)   SpO2 93%   BMI 25.70 kg/m  General:   Well developed, well nourished . NAD.  HEENT:  Normocephalic . Face symmetric, atraumatic.  EOMI, pupils equal and reactive Neck: Normal carotid pulses Lungs:  CTA B Normal respiratory effort, no intercostal retractions, no accessory muscle use. Heart: RRR,  no murmur.  No pretibial edema bilaterally  Skin: Not pale. Not jaundice Neurologic:  alert & oriented X3.  Speech normal, gait not tested, he is sitting in a wheelchair, seems comfortable.  Normal motor and DTRs.  Normal lower extremity coordination. Psych--  Cognition and judgment appear intact.  Cooperative with normal attention span and concentration.  Behavior appropriate. No anxious or depressed appearing.      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 Ronnald Ramp Chronic hoarseness, status post ENT eval ~ 2012 Normal DEXA 2012  PLAN: Dizziness: As described above, most likely an inner ear issue but  has multiple RF  for strokes. On clinical  grounds I believe this is inner ear related, pt and the wife agrees.  We discussed possibly doing a MRI to be sure but they will prefer to treat this as a peripheral issue. Plan: Fall prevention!.  Will use a cane a walker which he has at home. Antivert as needed. Good hydration. Refer to PT for vestibular rehab. Call 911 anytime if dizziness is associated with other symptoms. Elevated BP: BP elevated lately,  today was 172/90, recheck was 164/78.  Patient will monitor BPs at home.  See AVS. Add amlodipine?    Today, I spent more than 25   min with the patient: >50% of the time counseling regards possible etiology of his symptoms, p option of checking a MRI of the brain.  Discussing carefully the plan of care.  Multiple questions answered.

## 2017-07-01 NOTE — Assessment & Plan Note (Signed)
PLAN: Dizziness: As described above, most likely an inner ear issue but  has multiple RF  for strokes. On clinical grounds I believe this is inner ear related, pt and the wife agrees.  We discussed possibly doing a MRI to be sure but they will prefer to treat this as a peripheral issue. Plan: Fall prevention!.  Will use a cane a walker which he has at home. Antivert as needed. Good hydration. Refer to PT for vestibular rehab. Call 911 anytime if dizziness is associated with other symptoms. Elevated BP: BP elevated lately, today was 172/90, recheck was 164/78.  Patient will monitor BPs at home.  See AVS. Add amlodipine?

## 2017-07-06 DIAGNOSIS — H8190 Unspecified disorder of vestibular function, unspecified ear: Secondary | ICD-10-CM | POA: Diagnosis not present

## 2017-07-06 DIAGNOSIS — M6281 Muscle weakness (generalized): Secondary | ICD-10-CM | POA: Diagnosis not present

## 2017-07-06 DIAGNOSIS — R42 Dizziness and giddiness: Secondary | ICD-10-CM | POA: Diagnosis not present

## 2017-07-06 DIAGNOSIS — R2689 Other abnormalities of gait and mobility: Secondary | ICD-10-CM | POA: Diagnosis not present

## 2017-07-07 ENCOUNTER — Ambulatory Visit (HOSPITAL_COMMUNITY)
Admission: RE | Admit: 2017-07-07 | Discharge: 2017-07-07 | Disposition: A | Discharge status: Home / Self Care | Payer: PPO | Source: Ambulatory Visit | Attending: Cardiovascular Disease | Admitting: Cardiovascular Disease

## 2017-07-07 DIAGNOSIS — I739 Peripheral vascular disease, unspecified: Secondary | ICD-10-CM | POA: Diagnosis not present

## 2017-07-07 DIAGNOSIS — J449 Chronic obstructive pulmonary disease, unspecified: Secondary | ICD-10-CM | POA: Diagnosis not present

## 2017-07-07 DIAGNOSIS — Z87891 Personal history of nicotine dependence: Secondary | ICD-10-CM | POA: Insufficient documentation

## 2017-07-07 DIAGNOSIS — E785 Hyperlipidemia, unspecified: Secondary | ICD-10-CM | POA: Insufficient documentation

## 2017-07-07 DIAGNOSIS — I10 Essential (primary) hypertension: Secondary | ICD-10-CM | POA: Diagnosis not present

## 2017-07-07 DIAGNOSIS — I2510 Atherosclerotic heart disease of native coronary artery without angina pectoris: Secondary | ICD-10-CM | POA: Diagnosis not present

## 2017-07-07 DIAGNOSIS — E1151 Type 2 diabetes mellitus with diabetic peripheral angiopathy without gangrene: Secondary | ICD-10-CM | POA: Insufficient documentation

## 2017-07-09 ENCOUNTER — Telehealth: Payer: Self-pay | Admitting: *Deleted

## 2017-07-09 ENCOUNTER — Telehealth: Payer: Self-pay | Admitting: Cardiovascular Disease

## 2017-07-09 DIAGNOSIS — H8190 Unspecified disorder of vestibular function, unspecified ear: Secondary | ICD-10-CM | POA: Diagnosis not present

## 2017-07-09 DIAGNOSIS — M6281 Muscle weakness (generalized): Secondary | ICD-10-CM | POA: Diagnosis not present

## 2017-07-09 DIAGNOSIS — R42 Dizziness and giddiness: Secondary | ICD-10-CM | POA: Diagnosis not present

## 2017-07-09 DIAGNOSIS — R2689 Other abnormalities of gait and mobility: Secondary | ICD-10-CM | POA: Diagnosis not present

## 2017-07-09 NOTE — Telephone Encounter (Signed)
Received Physician Orders from [facility]; forwarded to provider/SLS 11/16

## 2017-07-09 NOTE — Telephone Encounter (Signed)
Called patient's wife back (DPR) with test results.

## 2017-07-09 NOTE — Telephone Encounter (Signed)
MRs.Sheeler is returning your call about [name] . Thanks

## 2017-07-12 DIAGNOSIS — R2689 Other abnormalities of gait and mobility: Secondary | ICD-10-CM | POA: Diagnosis not present

## 2017-07-12 DIAGNOSIS — H8190 Unspecified disorder of vestibular function, unspecified ear: Secondary | ICD-10-CM | POA: Diagnosis not present

## 2017-07-12 DIAGNOSIS — M6281 Muscle weakness (generalized): Secondary | ICD-10-CM | POA: Diagnosis not present

## 2017-07-12 DIAGNOSIS — R42 Dizziness and giddiness: Secondary | ICD-10-CM | POA: Diagnosis not present

## 2017-07-12 NOTE — Telephone Encounter (Signed)
Form signed and faxed to [facility] at [phone]. Form sent for scanning.

## 2017-07-13 ENCOUNTER — Ambulatory Visit: Payer: PPO | Admitting: Podiatry

## 2017-07-19 DIAGNOSIS — H8190 Unspecified disorder of vestibular function, unspecified ear: Secondary | ICD-10-CM | POA: Diagnosis not present

## 2017-07-19 DIAGNOSIS — M6281 Muscle weakness (generalized): Secondary | ICD-10-CM | POA: Diagnosis not present

## 2017-07-19 DIAGNOSIS — R42 Dizziness and giddiness: Secondary | ICD-10-CM | POA: Diagnosis not present

## 2017-07-19 DIAGNOSIS — R2689 Other abnormalities of gait and mobility: Secondary | ICD-10-CM | POA: Diagnosis not present

## 2017-07-23 DIAGNOSIS — H8190 Unspecified disorder of vestibular function, unspecified ear: Secondary | ICD-10-CM | POA: Diagnosis not present

## 2017-07-23 DIAGNOSIS — R42 Dizziness and giddiness: Secondary | ICD-10-CM | POA: Diagnosis not present

## 2017-07-23 DIAGNOSIS — R2689 Other abnormalities of gait and mobility: Secondary | ICD-10-CM | POA: Diagnosis not present

## 2017-07-23 DIAGNOSIS — M6281 Muscle weakness (generalized): Secondary | ICD-10-CM | POA: Diagnosis not present

## 2017-07-26 DIAGNOSIS — M6281 Muscle weakness (generalized): Secondary | ICD-10-CM | POA: Diagnosis not present

## 2017-07-26 DIAGNOSIS — H8190 Unspecified disorder of vestibular function, unspecified ear: Secondary | ICD-10-CM | POA: Diagnosis not present

## 2017-07-26 DIAGNOSIS — R2689 Other abnormalities of gait and mobility: Secondary | ICD-10-CM | POA: Diagnosis not present

## 2017-07-26 DIAGNOSIS — R42 Dizziness and giddiness: Secondary | ICD-10-CM | POA: Diagnosis not present

## 2017-07-28 DIAGNOSIS — R42 Dizziness and giddiness: Secondary | ICD-10-CM | POA: Diagnosis not present

## 2017-07-28 DIAGNOSIS — M6281 Muscle weakness (generalized): Secondary | ICD-10-CM | POA: Diagnosis not present

## 2017-07-28 DIAGNOSIS — H8190 Unspecified disorder of vestibular function, unspecified ear: Secondary | ICD-10-CM | POA: Diagnosis not present

## 2017-07-28 DIAGNOSIS — R2689 Other abnormalities of gait and mobility: Secondary | ICD-10-CM | POA: Diagnosis not present

## 2017-08-09 ENCOUNTER — Other Ambulatory Visit: Payer: Self-pay | Admitting: Internal Medicine

## 2017-09-21 ENCOUNTER — Ambulatory Visit: Payer: PPO | Admitting: Podiatry

## 2017-09-21 ENCOUNTER — Encounter: Payer: Self-pay | Admitting: Podiatry

## 2017-09-21 DIAGNOSIS — M79676 Pain in unspecified toe(s): Secondary | ICD-10-CM | POA: Diagnosis not present

## 2017-09-21 DIAGNOSIS — D689 Coagulation defect, unspecified: Secondary | ICD-10-CM

## 2017-09-21 DIAGNOSIS — B351 Tinea unguium: Secondary | ICD-10-CM

## 2017-09-21 DIAGNOSIS — E119 Type 2 diabetes mellitus without complications: Secondary | ICD-10-CM

## 2017-09-21 NOTE — Progress Notes (Signed)
Patient ID: Phillip Jordan, male   DOB: December 04, 1929, 84 y.o.   MRN: [id number] Complaint:  Visit Type: Patient returns to my office for continued preventative foot care services. Complaint: Patient states" my nails have grown long and thick and become painful to walk and wear shoes" Patient has been diagnosed with DM with no foot complications. The patient presents for preventative foot care services. No changes to ROS.  He has been under treatment for COPD.   Podiatric Exam: Vascular: dorsalis pedis and posterior tibial pulses are palpable bilateral. Capillary return is immediate. Temperature gradient is WNL. Skin turgor WNL  Sensorium: Normal Semmes Weinstein monofilament test except toes both feet.. Normal tactile sensation bilaterally. Nail Exam: Pt has thick disfigured discolored nails with subungual debris noted bilateral entire nail hallux through fifth toenails Ulcer Exam: There is no evidence of ulcer or pre-ulcerative changes or infection. Orthopedic Exam: Muscle tone and strength are WNL. No limitations in general ROM. No crepitus or effusions noted. Foot type and digits show no abnormalities. Bony prominences are unremarkable. Skin:  Asymptomatic porokeratosis sub 1 right foot. porokeratosis noted. No infection or ulcers  Diagnosis:  Onychomycosis, , Pain in right toe, pain in left toe   Treatment & Plan Procedures and Treatment: Consent by patient was obtained for treatment procedures. The patient understood the discussion of treatment and procedures well. All questions were answered thoroughly reviewed. Debridement of mycotic and hypertrophic toenails, 1 through 5 bilateral and clearing of subungual debris. No ulceration, no infection noted. Return Visit-Office Procedure: Patient instructed to return to the office for a follow up visit 3 months for continued evaluation and treatment.    [name] DPM

## 2017-10-07 ENCOUNTER — Encounter: Payer: Self-pay | Admitting: Internal Medicine

## 2017-10-08 ENCOUNTER — Encounter: Payer: Self-pay | Admitting: Internal Medicine

## 2017-10-09 ENCOUNTER — Encounter: Payer: Self-pay | Admitting: Internal Medicine

## 2017-10-11 MED ORDER — GLUCOSE BLOOD VI STRP: ORAL_STRIP | 12 refills | Status: DC

## 2017-10-12 ENCOUNTER — Other Ambulatory Visit: Payer: Self-pay | Admitting: Internal Medicine

## 2017-10-25 ENCOUNTER — Ambulatory Visit (INDEPENDENT_AMBULATORY_CARE_PROVIDER_SITE_OTHER): Payer: PPO | Admitting: Internal Medicine

## 2017-10-25 ENCOUNTER — Encounter: Payer: Self-pay | Admitting: Internal Medicine

## 2017-10-25 ENCOUNTER — Telehealth: Payer: Self-pay | Admitting: Internal Medicine

## 2017-10-25 VITALS — BP 160/72 | HR 73 | Temp 97.9°F | Resp 20 | Ht 69.0 in | Wt 177.8 lb

## 2017-10-25 DIAGNOSIS — Z0000 Encounter for general adult medical examination without abnormal findings: Secondary | ICD-10-CM

## 2017-10-25 DIAGNOSIS — E785 Hyperlipidemia, unspecified: Secondary | ICD-10-CM

## 2017-10-25 DIAGNOSIS — E11319 Type 2 diabetes mellitus with unspecified diabetic retinopathy without macular edema: Secondary | ICD-10-CM

## 2017-10-25 DIAGNOSIS — I10 Essential (primary) hypertension: Secondary | ICD-10-CM

## 2017-10-25 MED ORDER — LOSARTAN POTASSIUM 50 MG PO TABS: 50.0000 mg | ORAL_TABLET | Freq: Every day | ORAL | 1 refills | Status: DC

## 2017-10-25 MED ORDER — AMLODIPINE BESYLATE 2.5 MG PO TABS: 2.5000 mg | ORAL_TABLET | Freq: Every day | ORAL | 1 refills | Status: DC

## 2017-10-25 NOTE — Assessment & Plan Note (Signed)
-  Td 2007 ;pneumonia shot x 3 , last 05-2012; prevnar: 08-2015; zostavax - 2009; shingrix not available  - CCS, prostate ca screening:  no further eval, see previous notes -Diet and exercise discussed

## 2017-10-25 NOTE — Telephone Encounter (Signed)
Copied from [place] [id number]. Topic: Quick Communication - See Telephone Encounter >> Oct 25, 2017  4:40 PM Percell Belt A wrote: CRM for notification. See Telephone encounter for:  pt came in and was seen today.  losartan (COZAAR) 50 MG tablet [[id number]] was called in but this has been recalled and they would like something else called in?  Pharmacy -[facility] on [address]  10/25/17.

## 2017-10-25 NOTE — Patient Instructions (Signed)
  GO TO THE FRONT DESK Schedule labs to be done in 2-3 weeks from today, fasting  Schedule your next appointment for a checkup in 4 months  === Add 2 medications to your regimen: Losartan 50 mg 1 tablet daily Amlodipine 2.5 mg 1 tablet daily.  Watch for leg swelling  Check the  blood pressure 2 or 3 times a   week   Be sure your blood pressure is between 110/65 and  135/85. If it is consistently higher or lower, let me know  == For hip pain: Stop ibuprofen Tylenol  500 mg OTC 2 tabs a day every 8 hours as needed for pain

## 2017-10-25 NOTE — Telephone Encounter (Signed)
Losartan 50mg  is effected at Pt's pharmacy- please advise.

## 2017-10-25 NOTE — Progress Notes (Signed)
Subjective:    Patient ID: Phillip Jordan, male    DOB: 02-29-1930, 84 y.o.   MRN: [id number]  DOS:  10/25/2017 Type of visit - description : cpx Interval history: Here for CPX, we talk about other issues as well. BP Readings from Last 3 Encounters:  10/25/17 (!) 160/72  07/01/17 (!) 164/78  06/28/17 (!) 150/80     Review of Systems Only concern is right hip pain, for the last 6 months, usually triggered by walking, located at the front/deep hip. Taking ibuprofen with some help.  No recent fall. Had a URI last week, now back to baseline, he takes [place] regularly and has not used albuterol other than when he had a cold. No L UTS other than occasionally the stream "starts slow".   Other than above, a 14 point review of systems is negative    Past Medical History:  Diagnosis Date  . Allergic rhinitis   . Aortic dissection ([place])    f/u by cards - AAA with distal dissection.  [name] CAD (coronary artery disease)    a. s/p MI 1992. b. Prior hx stenting to RCA/Cx. b. 05/2013: [place] s/p DES to distal RCA then staged DES to prox LAD.   [name] COPD with asthma ([place])   . Diabetes mellitus   . Hyperlipidemia   . Lichen planus    Right midline inferior chest, Dr. [name] Sensing  . Shingles     Past Surgical History:  Procedure Laterality Date  . BACK SURGERY  1993  . CARDIAC CATHETERIZATION     left, w/ coronary angiography and left ventriculograpy  . LEFT HEART CATHETERIZATION WITH CORONARY ANGIOGRAM N/A 06/09/2013   Procedure: LEFT HEART CATHETERIZATION WITH CORONARY ANGIOGRAM;  Surgeon: [name], MD;  Location: [facility] CATH LAB;  Service: Cardiovascular;  Laterality: N/A;  . PERCUTANEOUS CORONARY STENT INTERVENTION (PCI-S) N/A 06/12/2013   Procedure: PERCUTANEOUS CORONARY STENT INTERVENTION (PCI-S);  Surgeon: [name], MD;  Location: [facility] CATH LAB;  Service: Cardiovascular;  Laterality: N/A;  prox LAD  . PTCA     w/ placement of drugeluting stent in the distal right coronary  artery, and distal Cardiologist: [name]    Social History   Socioeconomic History  . Marital status: Married    Spouse name: Not on file  . Number of children: 3  . Years of education: Not on file  . Highest education level: Not on file  Social Needs  . Financial resource strain: Not on file  . Food insecurity - worry: Not on file  . Food insecurity - inability: Not on file  . Transportation needs - medical: Not on file  . Transportation needs - non-medical: Not on file  Occupational History  . Occupation: retired  Tobacco Use  . Smoking status: Former Smoker    Packs/day: 1.50    Years: 39.00    Pack years: 58.50    Types: Cigarettes    Last attempt to quit: 08/24/1992    Years since quitting: 25.1  . Smokeless tobacco: Never Used  Substance and Sexual Activity  . Alcohol use: No  . Drug use: No  . Sexual activity: Not on file  Other Topics Concern  . Not on file  Social History Narrative   Lives w/ wife    3 kids, 47 Gk-GGK     Family History  Problem Relation Age of Onset  . Asthma Mother   . Heart disease Mother        CHF  .  Cancer Mother        melanoma  . Heart disease Father        MI  . Prostate cancer Neg Hx   . Colon cancer Neg Hx      Allergies as of 10/25/2017   No Known Allergies     Medication List        Accurate as of 10/25/17 11:59 PM. Always use your most recent med list.          ACAPELLA Misc Use as directed   amLODipine 2.5 MG tablet Commonly known as:  NORVASC Take 1 tablet (2.5 mg total) by mouth daily.   aspirin 81 MG tablet Take 1 tablet (81 mg total) by mouth daily.   clopidogrel 75 MG tablet Commonly known as:  PLAVIX Take 1 tablet (75 mg total) by mouth daily.   famotidine 20 MG tablet Commonly known as:  PEPCID TAKE 1 TABLET BY MOUTH AT BEDTIME   glucose blood test strip Commonly known as:  FREESTYLE TEST STRIPS Check blood sugar no more than twice daily   isosorbide mononitrate 30 MG 24 hr  tablet Commonly known as:  IMDUR Take 1 tablet (30 mg total) by mouth 2 (two) times daily.   meclizine 12.5 MG tablet Commonly known as:  ANTIVERT Take 1 tablet (12.5 mg total) 3 (three) times daily as needed by mouth for dizziness.   metFORMIN 500 MG tablet Commonly known as:  GLUCOPHAGE Take 1 tablet (500 mg total) by mouth daily.   mometasone-formoterol 200-5 MCG/ACT Aero Commonly known as:  DULERA Inhale 2 puffs into the lungs 2 (two) times daily.   multivitamin with minerals Tabs tablet Take 1 tablet by mouth daily.   nitroGLYCERIN 0.4 MG SL tablet Commonly known as:  NITROSTAT Place 1 tablet (0.4 mg total) under the tongue every 5 (five) minutes as needed for chest pain (up to 3 doses only.).   pravastatin 40 MG tablet Commonly known as:  PRAVACHOL Take 1 tablet (40 mg total) by mouth daily.   PROAIR HFA 108 (90 Base) MCG/ACT inhaler Generic drug:  albuterol Inhale 1-2 puffs into the lungs every 4 (four) hours as needed for wheezing or shortness of breath.   telmisartan 20 MG tablet Commonly known as:  MICARDIS Take 1 tablet (20 mg total) by mouth daily.          Objective:   Physical Exam BP (!) 160/72 (BP Location: Right Arm, Patient Position: Sitting, Cuff Size: Normal)   Pulse 73   Temp 97.9 F (36.6 C) (Oral)   Resp 20   Ht 5\' 9"  (1.753 m)   Wt 177 lb 12.8 oz (80.6 kg)   SpO2 99%   BMI 26.26 kg/m  General:   Well developed, well nourished . NAD.  Neck: No  thyromegaly  HEENT:  Normocephalic . Face symmetric, atraumatic Lungs:  CTA B Normal respiratory effort, no intercostal retractions, no accessory muscle use. Heart: RRR,  no murmur.  No pretibial edema bilaterally  Abdomen:  Not distended, soft, non-tender. No rebound or rigidity.   MSK: Hip rotations both normal, mild pain triggered on the right. Skin: Exposed areas without rash. Not pale. Not jaundice Neurologic:  alert & oriented X3.  Speech normal, gait appropriate for age and  unassisted Strength symmetric and appropriate for age.  Psych: Cognition and judgment appear intact.  Cooperative with normal attention span and concentration.  Behavior appropriate. No anxious or depressed appearing.     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 Ronnald Ramp Chronic hoarseness, status post ENT eval ~ 2012 Normal DEXA 2012  PLAN: DM: Continue with metformin, check a A1c HTN: BPs are elevated here and at home in the 160s.  Last BMP satisfactory.  He has history of diabetes and CAD.  Will start a low dose of losartan 50 mg and amlodipine 2.5.  Check BMP in 2 weeks.  Monitor BPs.  See instructions Hyperlipidemia: On Pravachol, last LFTs normal, check a FLP Hip pain: Likely DJD, recommend to stop ibuprofen, try Tylenol. Asthma, COPD: Uses Dulera twice a day hardly ever uses albuterol.  No change RTC 2 weeks for blood work and 4 months for follow-up.

## 2017-10-26 MED ORDER — TELMISARTAN 20 MG PO TABS: 20.0000 mg | ORAL_TABLET | Freq: Every day | ORAL | 1 refills | Status: DC

## 2017-10-26 NOTE — Assessment & Plan Note (Signed)
DM: Continue with metformin, check a A1c HTN: BPs are elevated here and at home in the 160s.  Last BMP satisfactory.  He has history of diabetes and CAD.  Will start a low dose of losartan 50 mg and amlodipine 2.5.  Check BMP in 2 weeks.  Monitor BPs.  See instructions Hyperlipidemia: On Pravachol, last LFTs normal, check a FLP Hip pain: Likely DJD, recommend to stop ibuprofen, try Tylenol. Asthma, COPD: Uses Dulera twice a day hardly ever uses albuterol.  No change RTC 2 weeks for blood work and 4 months for follow-up.

## 2017-10-26 NOTE — Telephone Encounter (Signed)
Micardis 20 mg 1 p.o. daily

## 2017-10-26 NOTE — Telephone Encounter (Signed)
Rx sent to [facility]. Losartan d/c from med list.

## 2017-11-08 ENCOUNTER — Other Ambulatory Visit: Payer: Self-pay | Admitting: Internal Medicine

## 2017-11-08 ENCOUNTER — Other Ambulatory Visit: Payer: PPO

## 2017-11-09 ENCOUNTER — Other Ambulatory Visit (INDEPENDENT_AMBULATORY_CARE_PROVIDER_SITE_OTHER): Payer: PPO

## 2017-11-09 DIAGNOSIS — E11319 Type 2 diabetes mellitus with unspecified diabetic retinopathy without macular edema: Secondary | ICD-10-CM | POA: Diagnosis not present

## 2017-11-09 DIAGNOSIS — E785 Hyperlipidemia, unspecified: Secondary | ICD-10-CM

## 2017-11-09 DIAGNOSIS — I10 Essential (primary) hypertension: Secondary | ICD-10-CM | POA: Diagnosis not present

## 2017-11-09 LAB — HEMOGLOBIN A1C: HEMOGLOBIN A1C: 6.7 % — AB (ref 4.6–6.5)

## 2017-11-09 LAB — BASIC METABOLIC PANEL
BUN: 16 mg/dL (ref 6–23)
CO2: 30 meq/L (ref 19–32)
Calcium: 9.7 mg/dL (ref 8.4–10.5)
Chloride: 94 mEq/L — ABNORMAL LOW (ref 96–112)
Creatinine, Ser: 0.99 mg/dL (ref 0.40–1.50)
GFR: 76.27 mL/min (ref >60.00)
GLUCOSE: 145 mg/dL — AB (ref 70–99)
POTASSIUM: 4.8 meq/L (ref 3.5–5.1)
SODIUM: 130 meq/L — AB (ref 135–145)

## 2017-11-09 LAB — LIPID PANEL
CHOL/HDL RATIO: 4
Cholesterol: 164 mg/dL (ref 0–200)
HDL: 40.3 mg/dL (ref >39.00)
LDL Cholesterol: 88 mg/dL (ref 0–99)
NONHDL: 123.66
TRIGLYCERIDES: 176 mg/dL — AB (ref 0.0–149.0)
VLDL: 35.2 mg/dL (ref 0.0–40.0)

## 2017-11-09 NOTE — Telephone Encounter (Signed)
Rx approved and sent to the pharmacy by e-script.//AB/CMA 

## 2017-11-15 ENCOUNTER — Other Ambulatory Visit: Payer: Self-pay | Admitting: Internal Medicine

## 2017-11-24 IMAGING — DX DG CHEST 2V
2 series · 2 of 2 positions shown · non-contrast
Comparison: 07/13/2016, 10/21/2015 and earlier.

CLINICAL DATA: Four week history of cough, chest congestion and
shortness of breath. Current history of COPD and coronary artery
disease. Former smoker.

EXAM:
CHEST  2 VIEW

[chest pa]
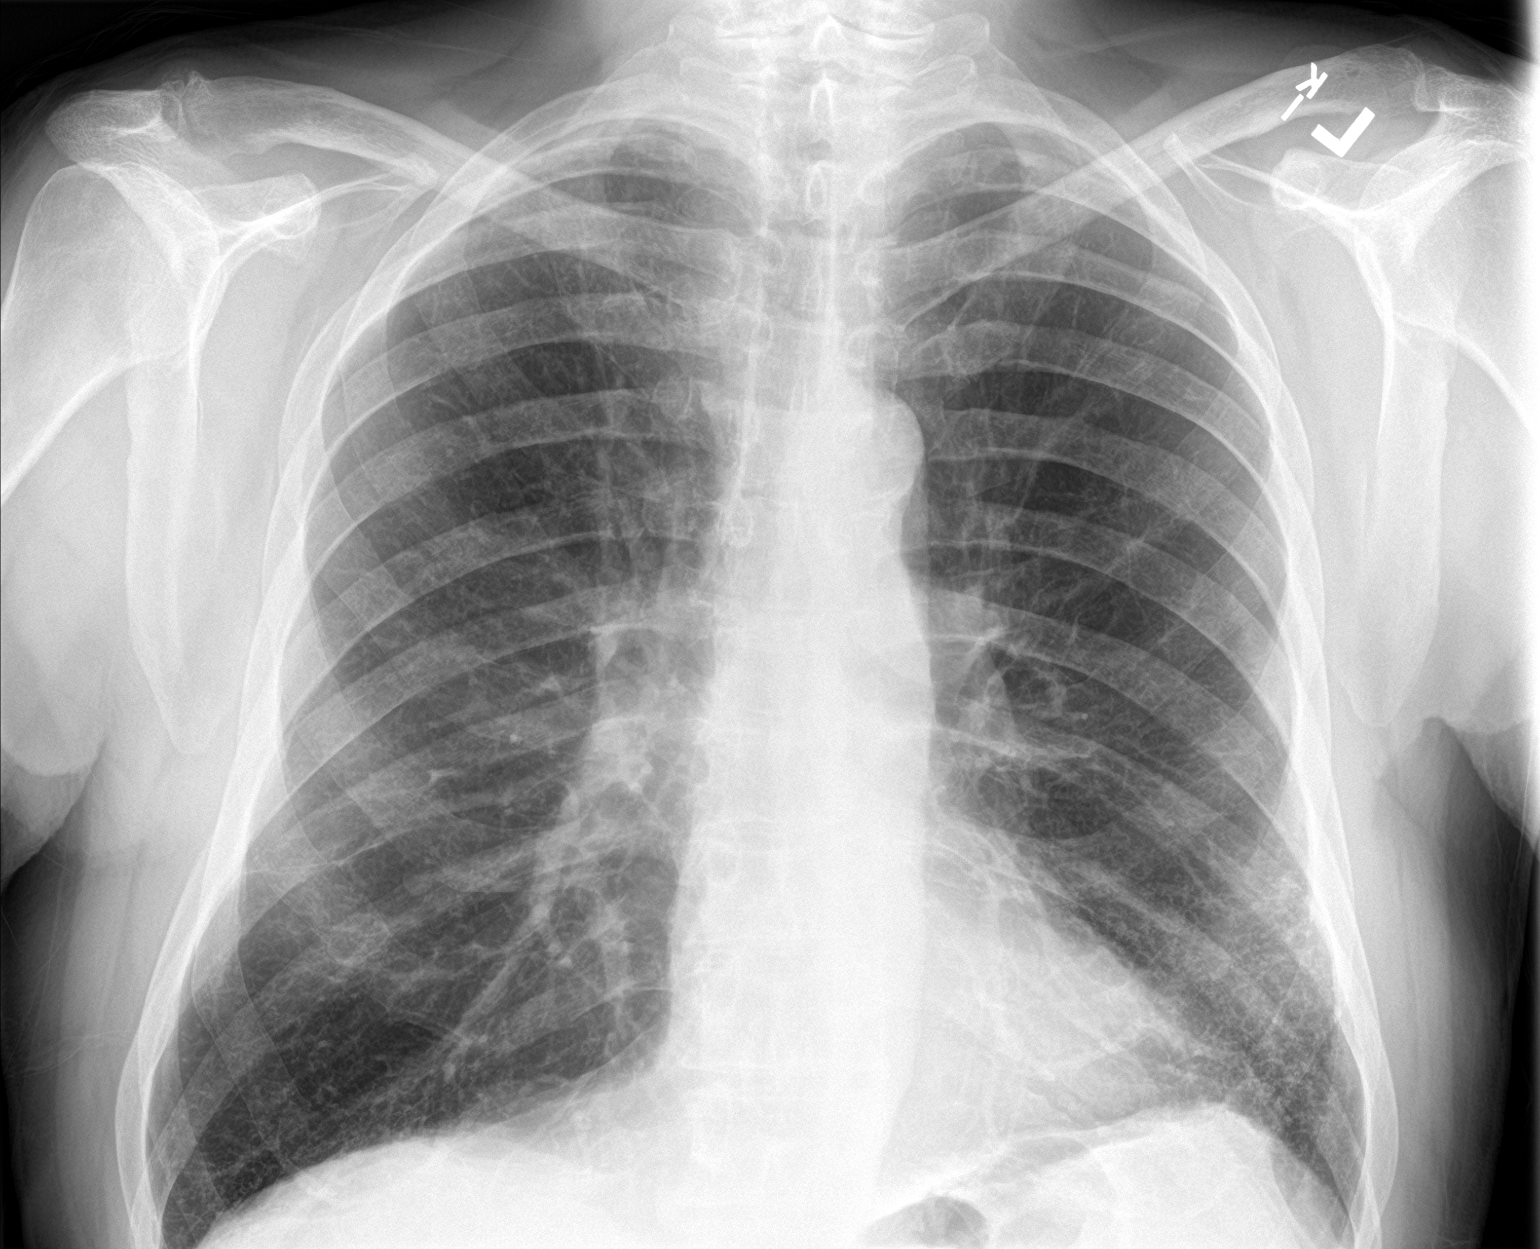

[chest lat]
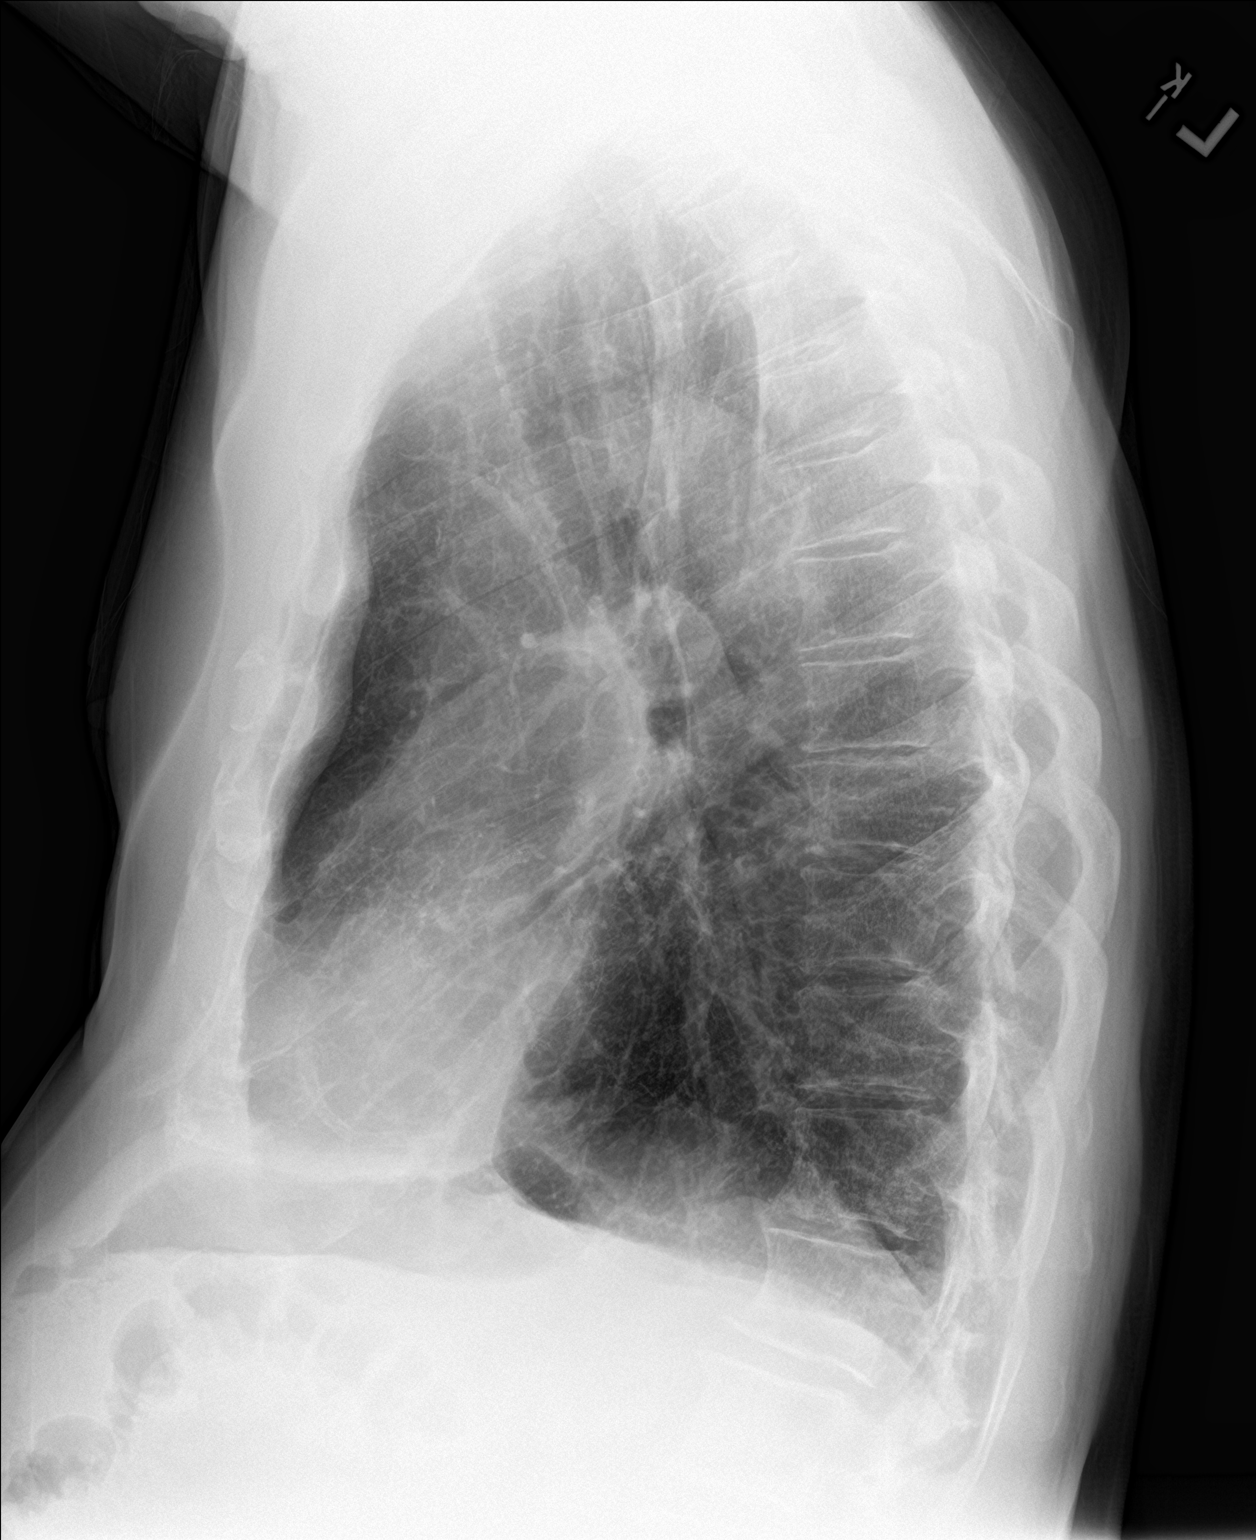

[2 of 2 positions shown; findings below may reference images not displayed]

FINDINGS: Cardiac silhouette normal in size, unchanged. Thoracic aorta
atherosclerotic, unchanged. Enlarged central pulmonary arteries,
unchanged. Hilar and mediastinal contours otherwise unremarkable.
Interstitial pulmonary fibrosis involving the lung bases, left
greater than right, unchanged. Emphysematous changes throughout both
lungs, unchanged. No new pulmonary parenchymal abnormalities. Mild
degenerative changes involving the thoracic spine.
IMPRESSION: No acute cardiopulmonary disease. Stable COPD/ emphysema and stable
interstitial pulmonary fibrosis involving the lung bases, left
greater than right.

## 2017-11-29 ENCOUNTER — Other Ambulatory Visit: Payer: Self-pay

## 2017-11-29 MED ORDER — TELMISARTAN 20 MG PO TABS: 20.0000 mg | ORAL_TABLET | Freq: Every day | ORAL | 1 refills | Status: DC

## 2017-11-29 MED ORDER — METFORMIN HCL 500 MG PO TABS: 500.0000 mg | ORAL_TABLET | Freq: Every day | ORAL | 1 refills | Status: DC

## 2017-11-29 MED ORDER — AMLODIPINE BESYLATE 2.5 MG PO TABS: 2.5000 mg | ORAL_TABLET | Freq: Every day | ORAL | 1 refills | Status: DC

## 2017-12-13 ENCOUNTER — Ambulatory Visit: Payer: PPO | Admitting: Internal Medicine

## 2017-12-13 ENCOUNTER — Encounter: Payer: Self-pay | Admitting: Internal Medicine

## 2017-12-13 VITALS — BP 118/60 | HR 70 | Ht 69.0 in | Wt 175.6 lb

## 2017-12-13 DIAGNOSIS — J449 Chronic obstructive pulmonary disease, unspecified: Secondary | ICD-10-CM | POA: Diagnosis not present

## 2017-12-13 NOTE — Progress Notes (Addendum)
Subjective:     Patient ID: Phillip Jordan, male    DOB: 10/21/1929   MRN: [id number]    Brief patient profile:  84  year old male, ex-smoker (1ppd x 30 years, quit 1990s), diagnosed with COPD per hx by Dr [name] (fev1 1.38L/44%, ratio 51 in Jan 2009). C/w GOLD III     History of Present Illness  05/25/2013 consult/ [name] re: sob/ prev eval MR with GOLD III COPD Chief Complaint  Patient presents with  . Pulmonary Consult    Referred per Dr. Kathlene November. The pt c/o DOE for the past 15 years, but worse for the past 6 months. He states that he gets SOB with walking up hills, but does okay with his exercise program 2-3 times per wk. He also c/o hoarseness and cough "for a long time"- cough is prod in the am with minimal clear sputum.      All his days are about the same in terms of activity tolerance  Breathing got better p quit smoking then worse again starting around mid 1990s Better on dulera, no better on spiriva ? Worse hoarseness so stopped it Work on inhaler technique:    Pantoprazole (protonix) 40 mg   Take 30-60 min before first meal of the day and Pepcid 20 mg one bedtime until return to office - this is the best way to tell whether stomach acid is contributing to your problem.  GERD diet    03/12/2017  f/u ov/Hang Ammon re:   COPD  III dulera 200 / singulair/ gerd rx  Chief Complaint  Patient presents with  . Follow-up    Breathing has improved back to his normal baseline. No new co's. He has not had to use rescue inhaler.    doe still = mmrc3  rec Plan A = Automatic = Dulera 200 Take 2 puffs first thing in am and then another 2 puffs about 12 hours later Also  Pantoprazole (protonix) 40 mg   Take  30-60 min before first meal of the day and Pepcid (famotidine)  20 mg one @  bedtime until return to office - this is the best way to tell whether stomach acid is contributing to your problem.   Plan B = Backup Only use your albuterol as a rescue medication to be used if you can't catch  your breath by resting or doing a relaxed purse lip breathing pattern.  - The less you use it, the better it will work when you need it. - Ok to use the inhaler up to 2 puffs  every 4 hours if you must but call for appointment if use goes up over your usual need - Don't leave home without it !!  (think of it like the spare tire for your car)  Whenever coughing / wheezing/ short of breath >  Take protonix Take 30- 60 min before your first and last meals of the day and once you are better reduce back to protonix 40 mg Take 30-60 min before first meal of the day  Plan C= Crisis If needing the albuterol (proair) more than a few times a week> Prednisone 10 mg take  4 each am x 2 days,   2 each am x 2 days,  1 each am x 2 days and stop     06/14/2017  f/u ov/Lynsi Dooner re: copd III on dulera 200/ singulair/ gerd rx/ pred prn used x one cycle since last ov Chief Complaint  Patient presents with  . Follow-up  He states his breathing is doing well. He rarely uses his albuterol inhaler.    doe improved to  [facility] = can't walk a nl pace on a flat grade s sob but does fine slow and flat eg does fine walking slow anywhere and also [facility]  Sleeps ok  Watery rhinitis worse p meals / not sure singulair helping rec Ok to try off singulair to see if worse nasal or lower respiratory symptoms and if so restart    12/13/2017  f/u ov/Shakyia Bosso re:   COPD III/ maint dulera 200 2bid  Chief Complaint  Patient presents with  . Follow-up    Breathing is overall doing well. He is using his albuterol inhaler 3 x wkly before exercise only.    Dyspnea:  Work out 3 x weekly on treadmill x 3lpm and 2 degrees grade  Cough: none Sleep: ok SABA use:  Only with ex    No obvious day to day or daytime variability or assoc excess/ purulent sputum or mucus plugs or hemoptysis or cp or chest tightness, subjective wheeze or overt sinus or hb symptoms. No unusual exposure hx or h/o childhood pna/ asthma or  knowledge of premature birth.  Sleeping  Ok   without nocturnal  or early am exacerbation  of respiratory  c/o's or need for noct saba. Also denies any obvious fluctuation of symptoms with weather or environmental changes or other aggravating or alleviating factors except as outlined above   Current Allergies, Complete Past Medical History, Past Surgical History, Family History, and Social History were reviewed in [employer] record.  ROS  The following are not active complaints unless bolded Hoarseness, sore throat, dysphagia, dental problems, itching, sneezing,  nasal congestion or discharge of excess mucus or purulent secretions, ear ache,   fever, chills, sweats, unintended wt loss or wt gain, classically pleuritic or exertional cp,  orthopnea pnd or arm/hand swelling  or leg swelling, presyncope, palpitations, abdominal pain, anorexia, nausea, vomiting, diarrhea  or change in bowel habits or change in bladder habits, change in stools or change in urine, dysuria, hematuria,  rash, arthralgias, visual complaints, headache, numbness, weakness or ataxia or problems with walking or coordination,  change in mood or  memory.        Current Meds  Medication Sig  . albuterol (PROAIR HFA) 108 (90 Base) MCG/ACT inhaler Inhale 1-2 puffs into the lungs every 4 (four) hours as needed for wheezing or shortness of breath.  [name] amLODipine (NORVASC) 2.5 MG tablet Take 1 tablet (2.5 mg total) by mouth daily.  [name] aspirin 81 MG tablet Take 1 tablet (81 mg total) by mouth daily.  . clopidogrel (PLAVIX) 75 MG tablet Take 1 tablet (75 mg total) by mouth daily.  . famotidine (PEPCID) 20 MG tablet TAKE 1 TABLET BY MOUTH AT BEDTIME  . glucose blood (FREESTYLE TEST STRIPS) test strip Check blood sugar no more than twice daily  . isosorbide mononitrate (IMDUR) 30 MG 24 hr tablet Take 1 tablet (30 mg total) by mouth 2 (two) times daily.  [name] losartan (COZAAR) 50 MG tablet Take 1 tablet by mouth daily.   . meclizine (ANTIVERT) 12.5 MG tablet Take 1 tablet (12.5 mg total) 3 (three) times daily as needed by mouth for dizziness.  . metFORMIN (GLUCOPHAGE) 500 MG tablet Take 1 tablet (500 mg total) by mouth daily.  . Misc. Devices (ACAPELLA) MISC Use as directed  . mometasone-formoterol (DULERA) 200-5 MCG/ACT AERO Inhale 2 puffs  into the lungs 2 (two) times daily.  . Multiple Vitamin (MULTIVITAMIN WITH MINERALS) TABS tablet Take 1 tablet by mouth daily.  . nitroGLYCERIN (NITROSTAT) 0.4 MG SL tablet Place 1 tablet (0.4 mg total) under the tongue every 5 (five) minutes as needed for chest pain (up to 3 doses only.).  [name] pravastatin (PRAVACHOL) 40 MG tablet TAKE 1 TABLET BY MOUTH ONCE DAILY  . telmisartan (MICARDIS) 20 MG tablet Take 1 tablet (20 mg total) by mouth daily.                Objective:   Physical Exam  amb slt hoarse robust wm nad  Vital signs reviewed - Note on arrival 02 sats  97% on RA      12/13/2017         175  06/14/2017       173  03/12/2017         170  01/15/2017         172  11/30/2016           175  10/03/2013        175  > 10/08/2015  176 >  10/21/2015 173 > 10/25/2015   173 > 11/08/2015 170 > 05/21/2016  172 > 07/13/2016 176  07/05/13 176 lb (79.833 kg)  06/26/13 173 lb 12.8 oz (78.835 kg)  06/16/13 174 lb (78.926 kg)         HEENT: nl dentition / oropharynx. Nl external ear canals without cough reflex - mild  bilateral non-specific turbinate edema     NECK :  without JVD/Nodes/TM/ nl carotid upstrokes bilaterally   LUNGS: no acc muscle use,  Mild barrel  contour chest wall with bilateral  Distant bs s audible wheeze and  without cough on insp or exp maneuver and mild   Hyperresonant  to  percussion bilaterally     CV:  RRR  no s3 or murmur or increase in P2, and no edema   ABD:  soft and nontender with pos mid insp Hoover's  in the supine position. No bruits or organomegaly appreciated, bowel sounds nl  MS:   Nl gait/  ext warm without deformities, calf  tenderness, cyanosis or clubbing No obvious joint restrictions   SKIN: warm and dry without lesions    NEURO:  alert, approp, nl sensorium with  no motor or cerebellar deficits apparent.                Assessment & Plan:

## 2017-12-13 NOTE — Assessment & Plan Note (Signed)
-   PFT's 07/02/11  FEV1  1.41 (55%) ratio 66 and 12% better p B2 with DLCO 96% - PFTs  07/05/2013 FEV1  1.05 and 1.31 p B2 (24%) with DLCO 59 corrects to 84%  - Spiriva and other mdi result in hoarseness - hfa 05/25/2013 75% p coaching>  90% 07/05/13  - added gerd rx 05/25/2013 >> improved 07/05/13  - rx dulera 200 2bid 07/05/13 > better 10/03/2013   - 10/08/2015 added GERD rx back due to pm strangling/ hoarseness  - 10/25/2015 added flutter - Rehab completed Aug 2017  - PFT's  05/21/2016  FEV1 1.23 (50 % ) ratio 45  p 32 % improvement from saba p dulera 200 prior to study with DLCO  56/57 % corrects to 72  % for alv volume   - 03/12/2017  After extensive coaching HFA effectiveness =    90%  - added pred x 6 days as backup plan prn as of 03/12/2017   Not needed prednisone back up to date with well compensated COPD on presen tx so reluctant to  change but may consider lama/laba or even just a lama at next ov if continues to do so well    Each maintenance medication was reviewed in detail including most importantly the difference between maintenance and as needed and under what circumstances the prns are to be used.  Please see AVS for specific  Instructions which are unique to this visit and I personally typed out  which were reviewed in detail in writing with the patient and a copy provided.

## 2017-12-13 NOTE — Patient Instructions (Signed)
No change in medications    Please schedule a follow up visit in 6  months but call sooner if needed  

## 2017-12-23 NOTE — Progress Notes (Signed)
Patient ID: Phillip Jordan, male   DOB: March 10, 1930, 84 y.o.   MRN: [id number]   84 y.o. with distant history of CAD with DES to proximal RCA and mid LAD in October 2014   Significant COPD Gold 3 followed by pulmonary  Stopped smoking in 1994  He thinks he is doing well with baseline dyspnea and no chest pain  Complains of legs hurting rest/exercise but had normal ABI's done October 2018 ? neuropathy   Going to see son in [place] soon [place] has gotten COPD a bit worse lately   ROS: Denies fever, malais, weight loss, blurry vision, decreased visual acuity, cough, sputum, SOB, hemoptysis, pleuritic pain, palpitaitons, heartburn, abdominal pain, melena, lower extremity edema, claudication, or rash.  All other systems reviewed and negative  General: BP 120/74   Pulse 78   Ht 5\' 9"  (1.753 m)   Wt 174 lb 8 oz (79.2 kg)   SpO2 95%   BMI 25.77 kg/m  Affect appropriate Healthy:  appears stated age 27: normal Neck supple with no adenopathy JVP normal no bruits no thyromegaly Lungs clear with no wheezing and good diaphragmatic motion Heart:  S1/S2 no murmur, no rub, gallop or click PMI normal Abdomen: benighn, BS positve, no tenderness, no AAA no bruit.  No HSM or HJR Distal pulses intact with no bruits No edema Neuro non-focal Skin warm and dry No muscular weakness    Current Outpatient Medications  Medication Sig Dispense Refill  . albuterol (PROAIR HFA) 108 (90 Base) MCG/ACT inhaler Inhale 1-2 puffs into the lungs every 4 (four) hours as needed for wheezing or shortness of breath.    [name] amLODipine (NORVASC) 2.5 MG tablet Take 1 tablet (2.5 mg total) by mouth daily. 90 tablet 1  . aspirin 81 MG tablet Take 1 tablet (81 mg total) by mouth daily.    . clopidogrel (PLAVIX) 75 MG tablet Take 1 tablet (75 mg total) by mouth daily. 90 tablet 2  . famotidine (PEPCID) 20 MG tablet TAKE 1 TABLET BY MOUTH AT BEDTIME 30 tablet 11  . glucose blood (FREESTYLE TEST STRIPS) test strip Check  blood sugar no more than twice daily 100 each 12  . isosorbide mononitrate (IMDUR) 30 MG 24 hr tablet Take 1 tablet (30 mg total) by mouth 2 (two) times daily. 180 tablet 1  . losartan (COZAAR) 50 MG tablet Take 1 tablet by mouth daily.    . meclizine (ANTIVERT) 12.5 MG tablet Take 1 tablet (12.5 mg total) 3 (three) times daily as needed by mouth for dizziness. 30 tablet 0  . metFORMIN (GLUCOPHAGE) 500 MG tablet Take 1 tablet (500 mg total) by mouth daily. 90 tablet 1  . Misc. Devices (ACAPELLA) MISC Use as directed 1 each 0  . mometasone-formoterol (DULERA) 200-5 MCG/ACT AERO Inhale 2 puffs into the lungs 2 (two) times daily. 1 Inhaler 0  . Multiple Vitamin (MULTIVITAMIN WITH MINERALS) TABS tablet Take 1 tablet by mouth daily.    . nitroGLYCERIN (NITROSTAT) 0.4 MG SL tablet Place 1 tablet (0.4 mg total) under the tongue every 5 (five) minutes as needed for chest pain (up to 3 doses only.). 25 tablet 5  . pravastatin (PRAVACHOL) 40 MG tablet TAKE 1 TABLET BY MOUTH ONCE DAILY 90 tablet 2  . telmisartan (MICARDIS) 20 MG tablet Take 1 tablet (20 mg total) by mouth daily. 90 tablet 1   No current facility-administered medications for this visit.     Allergies  Patient has no known allergies.  Electrocardiogram:  08/2013 SR RBBB possible old IMI  04/15/15  SR rate 26  PR 210 RBBB   Assessment and Plan CAD: .Stable with no angina and good activity level.  Continue medical Rx  HTN: Well controlled.  Continue current medications and low sodium Dash type diet.    AAA: upper normal 2.5 cm  No abdominal pain observe  DM:: Discussed low carb diet.  Target hemoglobin A1c is 6.5 or less.  Continue current medications.  Chol:  Cholesterol is at goal.  Continue current dose of statin and diet Rx.  No myalgias or side effects.  F/U  LFT's in 6 months. Lab Results  Component Value Date   LDLCALC 88 11/09/2017   COPD: no active wheezing continue Dulera f/u pulmonary   Leg Pain: not vascular normal  ABI's done 07/07/17         Jenkins Rouge

## 2017-12-24 ENCOUNTER — Ambulatory Visit: Payer: PPO | Admitting: Cardiovascular Disease

## 2017-12-24 ENCOUNTER — Encounter: Payer: Self-pay | Admitting: Cardiovascular Disease

## 2017-12-24 VITALS — BP 120/74 | HR 78 | Ht 69.0 in | Wt 174.5 lb

## 2017-12-24 DIAGNOSIS — I2510 Atherosclerotic heart disease of native coronary artery without angina pectoris: Secondary | ICD-10-CM | POA: Diagnosis not present

## 2017-12-24 DIAGNOSIS — I10 Essential (primary) hypertension: Secondary | ICD-10-CM | POA: Diagnosis not present

## 2017-12-24 DIAGNOSIS — E785 Hyperlipidemia, unspecified: Secondary | ICD-10-CM

## 2017-12-24 DIAGNOSIS — I714 Abdominal aortic aneurysm, without rupture, unspecified: Secondary | ICD-10-CM

## 2017-12-24 NOTE — Patient Instructions (Addendum)

## 2018-01-27 ENCOUNTER — Other Ambulatory Visit: Payer: Self-pay | Admitting: Internal Medicine

## 2018-01-27 DIAGNOSIS — J449 Chronic obstructive pulmonary disease, unspecified: Secondary | ICD-10-CM

## 2018-02-11 ENCOUNTER — Other Ambulatory Visit: Payer: Self-pay | Admitting: Internal Medicine

## 2018-02-11 DIAGNOSIS — I714 Abdominal aortic aneurysm, without rupture, unspecified: Secondary | ICD-10-CM

## 2018-02-18 ENCOUNTER — Ambulatory Visit (HOSPITAL_COMMUNITY)
Admission: RE | Admit: 2018-02-18 | Discharge: 2018-02-18 | Disposition: A | Discharge status: Home / Self Care | Payer: PPO | Source: Ambulatory Visit | Attending: Cardiovascular Disease | Admitting: Cardiovascular Disease

## 2018-02-18 DIAGNOSIS — I700 Atherosclerosis of aorta: Secondary | ICD-10-CM | POA: Insufficient documentation

## 2018-02-18 DIAGNOSIS — I714 Abdominal aortic aneurysm, without rupture, unspecified: Secondary | ICD-10-CM

## 2018-02-18 DIAGNOSIS — Z87891 Personal history of nicotine dependence: Secondary | ICD-10-CM | POA: Insufficient documentation

## 2018-02-23 NOTE — Progress Notes (Addendum)
Subjective:   Phillip Jordan is a 84 y.o. male who presents for Medicare Annual/Subsequent preventive examination.  Review of Systems: No ROS.  Medicare Wellness Visit. Additional risk factors are reflected in the social history. Cardiac Risk Factors include: advanced age (>79men, >10 women);diabetes mellitus;dyslipidemia;male gender Sleep patterns: Sleeps 8-9 hrs. Naps as needed.  Home Safety/Smoke Alarms: Feels safe in home. Smoke alarms in place.  Living environment; residence and Firearm Safety:Lives in 1 story home with wife. Walk in shower.  Eyes- Cataract surgery 2 yrs ago. Dr.Lyles yearly. UTD Male:   CCS- No longer doing routine screening due to age.    PSA-  Lab Results  Component Value Date   PSA 0.73 04/24/2010   PSA 0.79 04/23/2009   PSA 0.89 06/30/2007       Objective:    Vitals: BP 136/66 (BP Location: Left Arm, Patient Position: Sitting, Cuff Size: Normal)   Pulse (!) 52   Temp 98.1 F (36.7 C) (Oral)   Ht 5\' 9"  (1.753 m)   Wt 175 lb (79.4 kg)   SpO2 98%   BMI 25.84 kg/m   Body mass index is 25.84 kg/m.  Advanced Directives 03/01/2018 03/08/2017 03/04/2016 01/03/2016 08/28/2015 06/11/2013 06/09/2013  Does Patient Have a Medical Advance Directive? Yes Yes Yes Yes No Patient has advance directive, copy not in chart Patient does not have advance directive  Type of Advance Directive Living will Living will Living will Living will;Healthcare Power of Attorney - Living will;Healthcare Power of Attorney -  Does patient want to make changes to medical advance directive? - - - No - Patient declined - - -  Copy of [place] in Chart? - - Yes No - copy requested - - -  Would patient like information on creating a medical advance directive? - - - - No - patient declined information - -  Pre-existing out of facility DNR order (yellow form or pink MOST form) - - - - - No No    Tobacco Social History   Tobacco Use  Smoking Status Former Smoker  .  Packs/day: 1.50  . Years: 39.00  . Pack years: 58.50  . Types: Cigarettes  . Last attempt to quit: 08/24/1992  . Years since quitting: 25.5  Smokeless Tobacco Never Used     Counseling given: Not Answered   Clinical Intake:     Pain : No/denies pain                 Past Medical History:  Diagnosis Date  . Allergic rhinitis   . Aortic dissection ([place])    f/u by cards - AAA with distal dissection.  [name] CAD (coronary artery disease)    a. s/p MI 1992. b. Prior hx stenting to RCA/Cx. b. 05/2013: [place] s/p DES to distal RCA then staged DES to prox LAD.   [name] COPD with asthma ([place])   . Diabetes mellitus   . Hyperlipidemia   . Lichen planus    Right midline inferior chest, Dr. [name] Sensing  . Shingles    Past Surgical History:  Procedure Laterality Date  . BACK SURGERY  1993  . CARDIAC CATHETERIZATION     left, w/ coronary angiography and left ventriculograpy  . LEFT HEART CATHETERIZATION WITH CORONARY ANGIOGRAM N/A 06/09/2013   Procedure: LEFT HEART CATHETERIZATION WITH CORONARY ANGIOGRAM;  Surgeon: [name], MD;  Location: [facility] CATH LAB;  Service: Cardiovascular;  Laterality: N/A;  . PERCUTANEOUS CORONARY STENT INTERVENTION (PCI-S) N/A 06/12/2013  Procedure: PERCUTANEOUS CORONARY STENT INTERVENTION (PCI-S);  Surgeon: [name], MD;  Location: [facility] CATH LAB;  Service: Cardiovascular;  Laterality: N/A;  prox LAD  . PTCA     w/ placement of drugeluting stent in the distal right coronary artery, and distal Cardiologist: [name]   Family History  Problem Relation Age of Onset  . Asthma Mother   . Heart disease Mother        CHF  . Cancer Mother        melanoma  . Heart disease Father        MI  . Prostate cancer Neg Hx   . Colon cancer Neg Hx    Social History   Socioeconomic History  . Marital status: Married    Spouse name: Not on file  . Number of children: 3  . Years of education: Not on file  . Highest education level: Not on file    Occupational History  . Occupation: retired  Scientific laboratory technician  . Financial resource strain: Not on file  . Food insecurity:    Worry: Not on file    Inability: Not on file  . Transportation needs:    Medical: Not on file    Non-medical: Not on file  Tobacco Use  . Smoking status: Former Smoker    Packs/day: 1.50    Years: 39.00    Pack years: 58.50    Types: Cigarettes    Last attempt to quit: 08/24/1992    Years since quitting: 25.5  . Smokeless tobacco: Never Used  Substance and Sexual Activity  . Alcohol use: No  . Drug use: No  . Sexual activity: Not on file  Lifestyle  . Physical activity:    Days per week: Not on file    Minutes per session: Not on file  . Stress: Not on file  Relationships  . Social connections:    Talks on phone: Not on file    Gets together: Not on file    Attends religious service: Not on file    Active member of club or organization: Not on file    Attends meetings of clubs or organizations: Not on file    Relationship status: Not on file  Other Topics Concern  . Not on file  Social History Narrative   Lives w/ wife    3 kids, 63 [id number]    Outpatient Encounter Medications as of 03/01/2018  Medication Sig  . albuterol (PROAIR HFA) 108 (90 Base) MCG/ACT inhaler Inhale 1-2 puffs into the lungs every 4 (four) hours as needed for wheezing or shortness of breath.  [name] amLODipine (NORVASC) 2.5 MG tablet Take 1 tablet (2.5 mg total) by mouth daily.  [name] aspirin 81 MG tablet Take 1 tablet (81 mg total) by mouth daily.  . clopidogrel (PLAVIX) 75 MG tablet Take 1 tablet (75 mg total) by mouth daily.  . famotidine (PEPCID) 20 MG tablet TAKE 1 TABLET BY MOUTH AT BEDTIME  . glucose blood (FREESTYLE TEST STRIPS) test strip Check blood sugar no more than twice daily  . isosorbide mononitrate (IMDUR) 30 MG 24 hr tablet Take 1 tablet (30 mg total) by mouth 2 (two) times daily.  . meclizine (ANTIVERT) 12.5 MG tablet Take 1 tablet (12.5 mg total) 3 (three) times  daily as needed by mouth for dizziness.  . metFORMIN (GLUCOPHAGE) 500 MG tablet Take 1 tablet (500 mg total) by mouth daily.  . Misc. Devices (ACAPELLA) MISC Use as directed  . mometasone-formoterol (DULERA)  200-5 MCG/ACT AERO Inhale 2 puffs into the lungs 2 (two) times daily.  . Multiple Vitamin (MULTIVITAMIN WITH MINERALS) TABS tablet Take 1 tablet by mouth daily.  . nitroGLYCERIN (NITROSTAT) 0.4 MG SL tablet Place 1 tablet (0.4 mg total) under the tongue every 5 (five) minutes as needed for chest pain (up to 3 doses only.). (Patient not taking: Reported on 03/01/2018)  . pravastatin (PRAVACHOL) 40 MG tablet TAKE 1 TABLET BY MOUTH ONCE DAILY  . telmisartan (MICARDIS) 20 MG tablet Take 1 tablet (20 mg total) by mouth daily.  . [DISCONTINUED] losartan (COZAAR) 50 MG tablet Take 1 tablet by mouth daily.   No facility-administered encounter medications on file as of 03/01/2018.     Activities of Daily Living In your present state of health, do you have any difficulty performing the following activities: 03/01/2018 03/08/2017  Hearing? Y Y  Comment wers hearing aids. wearing hearing aids in both ears.  Vision? N N  Difficulty concentrating or making decisions? N N  Walking or climbing stairs? N Y  Comment - SOB w/ exertion  Dressing or bathing? N N  Doing errands, shopping? N N  Preparing Food and eating ? N N  Using the Toilet? N N  In the past six months, have you accidently leaked urine? N N  Do you have problems with loss of bowel control? N N  Managing your Medications? N N  Managing your Finances? N N  Housekeeping or managing your Housekeeping? N N  Some recent data might be hidden    Patient Care Team: Colon Branch, MD as PCP - [name], MD as Consulting Physician (Dermatology) [name], MD as Consulting Physician (Ophthalmology) [name], MD as Consulting Physician (Pulmonary Disease) [name], MD as Consulting Physician (Cardiology) [name], DPM as Consulting Physician (Podiatry)   Assessment:   This is a routine wellness examination for Phillip Jordan. Physical assessment deferred to PCP.  Exercise Activities and Dietary recommendations Current Exercise Habits: Structured exercise class, Type of exercise: treadmill;strength training/weights, Time (Minutes): > 60, Frequency (Times/Week): 3, Weekly Exercise (Minutes/Week): 0, Exercise limited by: None identified Diet (meal preparation, eat out, water intake, caffeinated beverages, dairy products, fruits and vegetables): well balanced    Goals    . Maintain healthy active lifestyle       Fall Risk Fall Risk  03/01/2018 03/08/2017 03/04/2016 01/03/2016 12/12/2015  Falls in the past year? No No No No No  Risk for fall due to : - - History of fall(s) - -   Depression Screen PHQ 2/9 Scores 03/01/2018 03/08/2017 03/19/2016 03/04/2016  PHQ - 2 Score 0 0 0 0  Exception Documentation - - - -    Cognitive Function MMSE - Mini Mental State Exam 03/01/2018 03/08/2017 03/04/2016  Not completed: Refused - -  Orientation to time - 5 5  Orientation to Place - 5 5  Registration - 3 3  Attention/ Calculation - 2 5  Recall - 2 3  Language- name 2 objects - 2 2  Language- repeat - 1 1  Language- follow 3 step command - 3 3  Language- read & follow direction - 1 1  Write a sentence - 1 1  Copy design - 1 1  Total score - 26 30        Immunization History  Administered Date(s) Administered  . Influenza Split 05/26/2011, 06/01/2012  . Influenza Whole 06/30/2007, 06/18/2008, 06/14/2009, 04/24/2010  . Influenza, High Dose Seasonal PF 06/19/2014, 05/02/2015,  06/08/2017  . Influenza,inj,Quad PF,6+ Mos 06/10/2013  . Influenza-Unspecified 05/21/2016  . Pneumococcal Conjugate-13 09/10/2015  . Pneumococcal Polysaccharide-23 08/24/2001, 06/30/2007, 06/01/2012  . Td 08/24/2005, 07/06/2016  . Zoster 10/13/2007   Screening Tests Health Maintenance  Topic Date Due  . FOOT EXAM  01/19/2018  .  INFLUENZA VACCINE  03/24/2018  . HEMOGLOBIN A1C  05/12/2018  . OPHTHALMOLOGY EXAM  06/09/2018  . TETANUS/TDAP  07/06/2026  . PNA vac Low Risk Adult  Completed        Plan:    Please schedule your next medicare wellness visit with me in 1 yr.  Continue to eat heart healthy diet (full of fruits, vegetables, whole grains, lean protein, water--limit salt, fat, and sugar intake) and increase physical activity as tolerated.  Continue doing brain stimulating activities (puzzles, reading, adult coloring books, staying active) to keep memory sharp.    I have personally reviewed and noted the following in the patient's chart:   . Medical and social history . Use of alcohol, tobacco or illicit drugs  . Current medications and supplements . Functional ability and status . Nutritional status . Physical activity . Advanced directives . List of other physicians . Hospitalizations, surgeries, and ER visits in previous 12 months . Vitals . Screenings to include cognitive, depression, and falls . Referrals and appointments  In addition, I have reviewed and discussed with patient certain preventive protocols, quality metrics, and best practice recommendations. A written personalized care plan for preventive services as well as general preventive health recommendations were provided to patient.     Naaman Plummer [place], [place]  03/01/2018  Kathlene November, MD

## 2018-03-01 ENCOUNTER — Ambulatory Visit (INDEPENDENT_AMBULATORY_CARE_PROVIDER_SITE_OTHER): Payer: PPO | Admitting: Internal Medicine

## 2018-03-01 ENCOUNTER — Encounter: Payer: Self-pay | Admitting: *Deleted

## 2018-03-01 ENCOUNTER — Encounter: Payer: Self-pay | Admitting: Internal Medicine

## 2018-03-01 ENCOUNTER — Ambulatory Visit (INDEPENDENT_AMBULATORY_CARE_PROVIDER_SITE_OTHER): Payer: PPO | Admitting: *Deleted

## 2018-03-01 VITALS — BP 136/66 | HR 52 | Temp 98.1°F | Ht 69.0 in | Wt 175.0 lb

## 2018-03-01 VITALS — BP 136/66 | HR 52 | Temp 98.1°F | Resp 16 | Ht 69.0 in | Wt 175.4 lb

## 2018-03-01 DIAGNOSIS — E11319 Type 2 diabetes mellitus with unspecified diabetic retinopathy without macular edema: Secondary | ICD-10-CM

## 2018-03-01 DIAGNOSIS — I2510 Atherosclerotic heart disease of native coronary artery without angina pectoris: Secondary | ICD-10-CM

## 2018-03-01 DIAGNOSIS — I10 Essential (primary) hypertension: Secondary | ICD-10-CM | POA: Diagnosis not present

## 2018-03-01 DIAGNOSIS — E875 Hyperkalemia: Secondary | ICD-10-CM | POA: Diagnosis not present

## 2018-03-01 DIAGNOSIS — Z0000 Encounter for general adult medical examination without abnormal findings: Secondary | ICD-10-CM

## 2018-03-01 LAB — BASIC METABOLIC PANEL
BUN: 12 mg/dL (ref 6–23)
CHLORIDE: 97 meq/L (ref 96–112)
CO2: 31 mEq/L (ref 19–32)
Calcium: 10.1 mg/dL (ref 8.4–10.5)
Creatinine, Ser: 0.9 mg/dL (ref 0.40–1.50)
GFR: 85.08 mL/min (ref >60.00)
Glucose, Bld: 91 mg/dL (ref 70–99)
Potassium: 5.3 mEq/L — ABNORMAL HIGH (ref 3.5–5.1)
Sodium: 139 mEq/L (ref 135–145)

## 2018-03-01 LAB — HEMOGLOBIN A1C: HEMOGLOBIN A1C: 6.5 % (ref 4.6–6.5)

## 2018-03-01 NOTE — Patient Instructions (Signed)
Please schedule your next medicare wellness visit with me in 1 yr.  Continue to eat heart healthy diet (full of fruits, vegetables, whole grains, lean protein, water--limit salt, fat, and sugar intake) and increase physical activity as tolerated.  Continue doing brain stimulating activities (puzzles, reading, adult coloring books, staying active) to keep memory sharp.    Phillip Jordan , Thank you for taking time to come for your Medicare Wellness Visit. I appreciate your ongoing commitment to your health goals. Please review the following plan we discussed and let me know if I can assist you in the future.   These are the goals we discussed: Goals    . Maintain healthy active lifestyle       This is a list of the screening recommended for you and due dates:  Health Maintenance  Topic Date Due  . Complete foot exam   01/19/2018  . Flu Shot  03/24/2018  . Hemoglobin A1C  05/12/2018  . Eye exam for diabetics  06/09/2018  . Tetanus Vaccine  07/06/2026  . Pneumonia vaccines  Completed    Health Maintenance, Male A healthy lifestyle and preventive care is important for your health and wellness. Ask your health care provider about what schedule of regular examinations is right for you. What should I know about weight and diet? Eat a Healthy Diet  Eat plenty of vegetables, fruits, whole grains, low-fat dairy products, and lean protein.  Do not eat a lot of foods high in solid fats, added sugars, or salt.  Maintain a Healthy Weight Regular exercise can help you achieve or maintain a healthy weight. You should:  Do at least 150 minutes of exercise each week. The exercise should increase your heart rate and make you sweat (moderate-intensity exercise).  Do strength-training exercises at least twice a week.  Watch Your Levels of Cholesterol and Blood Lipids  Have your blood tested for lipids and cholesterol every 5 years starting at 82 years of age. If you are at high risk for heart  disease, you should start having your blood tested when you are 82 years old. You may need to have your cholesterol levels checked more often if: ? Your lipid or cholesterol levels are high. ? You are older than 82 years of age. ? You are at high risk for heart disease.  What should I know about cancer screening? Many types of cancers can be detected early and may often be prevented. Lung Cancer  You should be screened every year for lung cancer if: ? You are a current smoker who has smoked for at least 30 years. ? You are a former smoker who has quit within the past 15 years.  Talk to your health care provider about your screening options, when you should start screening, and how often you should be screened.  Colorectal Cancer  Routine colorectal cancer screening usually begins at 82 years of age and should be repeated every 5-10 years until you are 82 years old. You may need to be screened more often if early forms of precancerous polyps or small growths are found. Your health care provider may recommend screening at an earlier age if you have risk factors for colon cancer.  Your health care provider may recommend using home test kits to check for hidden blood in the stool.  A small camera at the end of a tube can be used to examine your colon (sigmoidoscopy or colonoscopy). This checks for the earliest forms of colorectal cancer.  Prostate  and Testicular Cancer  Depending on your age and overall health, your health care provider may do certain tests to screen for prostate and testicular cancer.  Talk to your health care provider about any symptoms or concerns you have about testicular or prostate cancer.  Skin Cancer  Check your skin from head to toe regularly.  Tell your health care provider about any new moles or changes in moles, especially if: ? There is a change in a mole's size, shape, or color. ? You have a mole that is larger than a pencil eraser.  Always use  sunscreen. Apply sunscreen liberally and repeat throughout the day.  Protect yourself by wearing long sleeves, pants, a wide-brimmed hat, and sunglasses when outside.  What should I know about heart disease, diabetes, and high blood pressure?  If you are 87-27 years of age, have your blood pressure checked every 3-5 years. If you are 64 years of age or older, have your blood pressure checked every year. You should have your blood pressure measured twice-once when you are at a hospital or clinic, and once when you are not at a hospital or clinic. Record the average of the two measurements. To check your blood pressure when you are not at a hospital or clinic, you can use: ? An automated blood pressure machine at a pharmacy. ? A home blood pressure monitor.  Talk to your health care provider about your target blood pressure.  If you are between 10-43 years old, ask your health care provider if you should take aspirin to prevent heart disease.  Have regular diabetes screenings by checking your fasting blood sugar level. ? If you are at a normal weight and have a low risk for diabetes, have this test once every three years after the age of 33. ? If you are overweight and have a high risk for diabetes, consider being tested at a younger age or more often.  A one-time screening for abdominal aortic aneurysm (AAA) by ultrasound is recommended for men aged 29-75 years who are current or former smokers. What should I know about preventing infection? Hepatitis B If you have a higher risk for hepatitis B, you should be screened for this virus. Talk with your health care provider to find out if you are at risk for hepatitis B infection. Hepatitis C Blood testing is recommended for:  Everyone born from [birth year] through 1963.  Anyone with known risk factors for hepatitis C.  Sexually Transmitted Diseases (STDs)  You should be screened each year for STDs including gonorrhea and chlamydia if: ? You are  sexually active and are younger than 82 years of age. ? You are older than 82 years of age and your health care provider tells you that you are at risk for this type of infection. ? Your sexual activity has changed since you were last screened and you are at an increased risk for chlamydia or gonorrhea. Ask your health care provider if you are at risk.  Talk with your health care provider about whether you are at high risk of being infected with HIV. Your health care provider may recommend a prescription medicine to help prevent HIV infection.  What else can I do?  Schedule regular health, dental, and eye exams.  Stay current with your vaccines (immunizations).  Do not use any tobacco products, such as cigarettes, chewing tobacco, and e-cigarettes. If you need help quitting, ask your health care provider.  Limit alcohol intake to no more than 2 drinks  per day. One drink equals 12 ounces of beer, 5 ounces of wine, or 1 ounces of hard liquor.  Do not use street drugs.  Do not share needles.  Ask your health care provider for help if you need support or information about quitting drugs.  Tell your health care provider if you often feel depressed.  Tell your health care provider if you have ever been abused or do not feel safe at home. This information is not intended to replace advice given to you by your health care provider. Make sure you discuss any questions you have with your health care provider. Document Released: 02/06/2008 Document Revised: 04/08/2016 Document Reviewed: 05/14/2015 Elsevier Interactive Patient Education  [employer].

## 2018-03-01 NOTE — Assessment & Plan Note (Signed)
DM: Currently on metformin, check a A1c.  Denies symptoms of low sugars. HTN: Currently on amlodipine, Imdur, telmisartan.  Losartan was discontinued due to a recall, I asked the patient to be sure he stopped losartan and is not taking double ARB's.  Check a BMP CAD: Saw cardiology 12/24/2017, felt to be stable COPD: Saw pulmonary 12/13/2017, they noted that will consider lama/laba or lama at some point AAA: Last ultrasound 02/18/2018, stable at 2.5 cm RTC 6 months

## 2018-03-01 NOTE — Patient Instructions (Addendum)
GO TO THE LAB : Get the blood work     GO TO THE FRONT DESK Schedule your next appointment for a  Check up in 6 months    Check the  blood pressure 2 or 3 times a week Be sure your blood pressure is between 110/65 and  135/85. If it is consistently higher or lower, let me know   Be sure you are taking TELMISARTAN and not losartan

## 2018-03-01 NOTE — Progress Notes (Signed)
Pre visit review using our clinic review tool, if applicable. No additional management support is needed unless otherwise documented below in the visit note. 

## 2018-03-01 NOTE — Progress Notes (Signed)
Subjective:    Patient ID: Phillip Jordan, male    DOB: 02/29/1930, 84 y.o.   MRN: [id number]  DOS:  03/01/2018 Type of visit - description :  rov Interval history: In general feeling well. Good compliance of medications Notes from cardiology and pulmonology reviewed   Review of Systems  Denies chest pain, abdominal pain or lower extremity edema Past Medical History:  Diagnosis Date  . Allergic rhinitis   . Aortic dissection ([place])    f/u by cards - AAA with distal dissection.  [name] CAD (coronary artery disease)    a. s/p MI 1992. b. Prior hx stenting to RCA/Cx. b. 05/2013: [place] s/p DES to distal RCA then staged DES to prox LAD.   [name] COPD with asthma ([place])   . Diabetes mellitus   . Hyperlipidemia   . Lichen planus    Right midline inferior chest, Dr. [name] Sensing  . Shingles     Past Surgical History:  Procedure Laterality Date  . BACK SURGERY  1993  . CARDIAC CATHETERIZATION     left, w/ coronary angiography and left ventriculograpy  . LEFT HEART CATHETERIZATION WITH CORONARY ANGIOGRAM N/A 06/09/2013   Procedure: LEFT HEART CATHETERIZATION WITH CORONARY ANGIOGRAM;  Surgeon: [name], MD;  Location: [facility] CATH LAB;  Service: Cardiovascular;  Laterality: N/A;  . PERCUTANEOUS CORONARY STENT INTERVENTION (PCI-S) N/A 06/12/2013   Procedure: PERCUTANEOUS CORONARY STENT INTERVENTION (PCI-S);  Surgeon: [name], MD;  Location: [facility] CATH LAB;  Service: Cardiovascular;  Laterality: N/A;  prox LAD  . PTCA     w/ placement of drugeluting stent in the distal right coronary artery, and distal Cardiologist: [name]    Social History   Socioeconomic History  . Marital status: Married    Spouse name: Not on file  . Number of children: 3  . Years of education: Not on file  . Highest education level: Not on file  Occupational History  . Occupation: retired  Scientific laboratory technician  . Financial resource strain: Not on file  . Food insecurity:    Worry: Not on file   Inability: Not on file  . Transportation needs:    Medical: Not on file    Non-medical: Not on file  Tobacco Use  . Smoking status: Former Smoker    Packs/day: 1.50    Years: 39.00    Pack years: 58.50    Types: Cigarettes    Last attempt to quit: 08/24/1992    Years since quitting: 25.5  . Smokeless tobacco: Never Used  Substance and Sexual Activity  . Alcohol use: No  . Drug use: No  . Sexual activity: Not on file  Lifestyle  . Physical activity:    Days per week: Not on file    Minutes per session: Not on file  . Stress: Not on file  Relationships  . Social connections:    Talks on phone: Not on file    Gets together: Not on file    Attends religious service: Not on file    Active member of club or organization: Not on file    Attends meetings of clubs or organizations: Not on file    Relationship status: Not on file  . Intimate partner violence:    Fear of current or ex partner: Not on file    Emotionally abused: Not on file    Physically abused: Not on file    Forced sexual activity: Not on file  Other Topics Concern  . Not  on file  Social History Narrative   Lives w/ wife    3 kids, 76 Gk-GGK      Allergies as of 03/01/2018   No Known Allergies     Medication List        Accurate as of 03/01/18  5:35 PM. Always use your most recent med list.          ACAPELLA Misc Use as directed   amLODipine 2.5 MG tablet Commonly known as:  NORVASC Take 1 tablet (2.5 mg total) by mouth daily.   aspirin 81 MG tablet Take 1 tablet (81 mg total) by mouth daily.   clopidogrel 75 MG tablet Commonly known as:  PLAVIX Take 1 tablet (75 mg total) by mouth daily.   famotidine 20 MG tablet Commonly known as:  PEPCID TAKE 1 TABLET BY MOUTH AT BEDTIME   glucose blood test strip Commonly known as:  FREESTYLE TEST STRIPS Check blood sugar no more than twice daily   isosorbide mononitrate 30 MG 24 hr tablet Commonly known as:  IMDUR Take 1 tablet (30 mg total) by  mouth 2 (two) times daily.   meclizine 12.5 MG tablet Commonly known as:  ANTIVERT Take 1 tablet (12.5 mg total) 3 (three) times daily as needed by mouth for dizziness.   metFORMIN 500 MG tablet Commonly known as:  GLUCOPHAGE Take 1 tablet (500 mg total) by mouth daily.   mometasone-formoterol 200-5 MCG/ACT Aero Commonly known as:  DULERA Inhale 2 puffs into the lungs 2 (two) times daily.   multivitamin with minerals Tabs tablet Take 1 tablet by mouth daily.   nitroGLYCERIN 0.4 MG SL tablet Commonly known as:  NITROSTAT Place 1 tablet (0.4 mg total) under the tongue every 5 (five) minutes as needed for chest pain (up to 3 doses only.).   pravastatin 40 MG tablet Commonly known as:  PRAVACHOL TAKE 1 TABLET BY MOUTH ONCE DAILY   PROAIR HFA 108 (90 Base) MCG/ACT inhaler Generic drug:  albuterol Inhale 1-2 puffs into the lungs every 4 (four) hours as needed for wheezing or shortness of breath.   telmisartan 20 MG tablet Commonly known as:  MICARDIS Take 1 tablet (20 mg total) by mouth daily.          Objective:   Physical Exam BP 136/66 (BP Location: Left Arm, Patient Position: Sitting, Cuff Size: Small)   Pulse (!) 52   Temp 98.1 F (36.7 C) (Oral)   Resp 16   Ht 5\' 9"  (1.753 m)   Wt 175 lb 6 oz (79.5 kg)   SpO2 98%   BMI 25.90 kg/m  General:   Well developed, NAD, see BMI.  HEENT:  Normocephalic . Face symmetric, atraumatic Lungs:  CTA B Normal respiratory effort, no intercostal retractions, no accessory muscle use. Heart: RRR,  no murmur.  No pretibial edema bilaterally  Skin: Not pale. Not jaundice Neurologic:  alert & oriented X3.  Speech normal, gait appropriate for age and unassisted Psych--  Cognition and judgment appear intact.  Cooperative with normal attention span and concentration.  Behavior appropriate. No anxious or depressed appearing.     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 Ronnald Ramp Chronic hoarseness, status post ENT eval ~ 2012 Normal  DEXA 2012  PLAN: DM: Currently on metformin, check a A1c.  Denies symptoms of low sugars. HTN: Currently on amlodipine, Imdur, telmisartan.  Losartan was discontinued due to a recall, I asked the patient to be sure he stopped losartan and is not taking double ARB's.  Check a BMP CAD: Saw cardiology 12/24/2017, felt to be stable COPD: Saw pulmonary 12/13/2017, they noted that will consider lama/laba or lama at some point AAA: Last ultrasound 02/18/2018, stable at 2.5 cm RTC 6 months

## 2018-03-03 DIAGNOSIS — L821 Other seborrheic keratosis: Secondary | ICD-10-CM | POA: Diagnosis not present

## 2018-03-03 DIAGNOSIS — L570 Actinic keratosis: Secondary | ICD-10-CM | POA: Diagnosis not present

## 2018-03-03 DIAGNOSIS — D225 Melanocytic nevi of trunk: Secondary | ICD-10-CM | POA: Diagnosis not present

## 2018-03-03 DIAGNOSIS — Z85828 Personal history of other malignant neoplasm of skin: Secondary | ICD-10-CM | POA: Diagnosis not present

## 2018-03-03 DIAGNOSIS — D1801 Hemangioma of skin and subcutaneous tissue: Secondary | ICD-10-CM | POA: Diagnosis not present

## 2018-03-03 DIAGNOSIS — D2371 Other benign neoplasm of skin of right lower limb, including hip: Secondary | ICD-10-CM | POA: Diagnosis not present

## 2018-03-03 NOTE — Addendum Note (Signed)
Addended byDamita Dunnings D on: 03/03/2018 08:14 AM   Modules accepted: Orders

## 2018-03-09 ENCOUNTER — Ambulatory Visit: Payer: PPO | Admitting: *Deleted

## 2018-03-16 ENCOUNTER — Other Ambulatory Visit (INDEPENDENT_AMBULATORY_CARE_PROVIDER_SITE_OTHER): Payer: PPO

## 2018-03-16 DIAGNOSIS — E875 Hyperkalemia: Secondary | ICD-10-CM

## 2018-03-16 LAB — BASIC METABOLIC PANEL
BUN: 15 mg/dL (ref 6–23)
CO2: 30 mEq/L (ref 19–32)
CREATININE: 0.99 mg/dL (ref 0.40–1.50)
Calcium: 9.5 mg/dL (ref 8.4–10.5)
Chloride: 100 mEq/L (ref 96–112)
GFR: 76.21 mL/min (ref >60.00)
Glucose, Bld: 146 mg/dL — ABNORMAL HIGH (ref 70–99)
Potassium: 5.1 mEq/L (ref 3.5–5.1)
Sodium: 137 mEq/L (ref 135–145)

## 2018-03-22 ENCOUNTER — Ambulatory Visit: Payer: PPO | Admitting: Podiatry

## 2018-03-22 ENCOUNTER — Encounter: Payer: Self-pay | Admitting: Podiatry

## 2018-03-22 DIAGNOSIS — M79676 Pain in unspecified toe(s): Secondary | ICD-10-CM | POA: Diagnosis not present

## 2018-03-22 DIAGNOSIS — D689 Coagulation defect, unspecified: Secondary | ICD-10-CM

## 2018-03-22 DIAGNOSIS — B351 Tinea unguium: Secondary | ICD-10-CM

## 2018-03-22 DIAGNOSIS — E119 Type 2 diabetes mellitus without complications: Secondary | ICD-10-CM

## 2018-03-22 DIAGNOSIS — Q828 Other specified congenital malformations of skin: Secondary | ICD-10-CM | POA: Diagnosis not present

## 2018-03-22 NOTE — Progress Notes (Addendum)
Patient ID: Phillip Jordan, male   DOB: [birth year], 84 y.o.   MRN: [id number] Complaint:  Visit Type: Patient returns to my office for continued preventative foot care services. Complaint: Patient states" my nails have grown long and thick and become painful to walk and wear shoes" Patient has been diagnosed with DM with no foot complications. The patient presents for preventative foot care services. No changes to ROS.  He has been under treatment for COPD.   Podiatric Exam: Vascular: dorsalis pedis and posterior tibial pulses are palpable bilateral. Capillary return is immediate. Temperature gradient is WNL. Skin turgor WNL  Sensorium: Normal Semmes Weinstein monofilament test except toes both feet.. Normal tactile sensation bilaterally. Nail Exam: Pt has thick disfigured discolored nails with subungual debris noted bilateral entire nail hallux through fifth toenails Ulcer Exam: There is no evidence of ulcer or pre-ulcerative changes or infection. Orthopedic Exam: Muscle tone and strength are WNL. No limitations in general ROM. No crepitus or effusions noted. Foot type and digits show no abnormalities. Bony prominences are unremarkable. Skin:  Asymptomatic porokeratosis sub 1 right foot. porokeratosis noted. No infection or ulcers  Diagnosis:  Onychomycosis, , Pain in right toe, pain in left toe  Debride porokeratosis  Treatment & Plan Procedures and Treatment: Consent by patient was obtained for treatment procedures. The patient understood the discussion of treatment and procedures well. All questions were answered thoroughly reviewed. Debridement of mycotic and hypertrophic toenails, 1 through 5 bilateral and clearing of subungual debris. No ulceration, no infection noted. Return Visit-Office Procedure: Patient instructed to return to the office for a follow up visit 6 months for continued evaluation and treatment.    [name] DPM

## 2018-04-11 ENCOUNTER — Other Ambulatory Visit: Payer: Self-pay | Admitting: Internal Medicine

## 2018-04-12 ENCOUNTER — Other Ambulatory Visit: Payer: Self-pay

## 2018-04-12 MED ORDER — AMLODIPINE BESYLATE 2.5 MG PO TABS: 2.5000 mg | ORAL_TABLET | Freq: Every day | ORAL | 1 refills | Status: DC

## 2018-05-16 ENCOUNTER — Ambulatory Visit (INDEPENDENT_AMBULATORY_CARE_PROVIDER_SITE_OTHER): Payer: PPO

## 2018-05-16 DIAGNOSIS — Z23 Encounter for immunization: Secondary | ICD-10-CM

## 2018-05-24 ENCOUNTER — Ambulatory Visit (INDEPENDENT_AMBULATORY_CARE_PROVIDER_SITE_OTHER): Payer: PPO | Admitting: Internal Medicine

## 2018-05-24 ENCOUNTER — Encounter: Payer: Self-pay | Admitting: Internal Medicine

## 2018-05-24 VITALS — BP 132/70 | HR 66 | Temp 97.6°F | Resp 16 | Ht 69.0 in | Wt 174.4 lb

## 2018-05-24 DIAGNOSIS — H6123 Impacted cerumen, bilateral: Secondary | ICD-10-CM | POA: Diagnosis not present

## 2018-05-24 NOTE — Progress Notes (Signed)
Subjective:    Patient ID: Phillip Jordan, male    DOB: [birth year], 84 y.o.   MRN: [id number]  DOS:  05/24/2018 Type of visit - description : acute Interval history: Feels like his ears are clogged with wax, that has been a problem on and off   Review of Systems Denies ear pain or discharge No recent URI  Past Medical History:  Diagnosis Date  . Allergic rhinitis   . Aortic dissection ([place])    f/u by cards - AAA with distal dissection.  [name] CAD (coronary artery disease)    a. s/p MI 1992. b. Prior hx stenting to RCA/Cx. b. 05/2013: [place] s/p DES to distal RCA then staged DES to prox LAD.   [name] COPD with asthma ([place])   . Diabetes mellitus   . Hyperlipidemia   . Lichen planus    Right midline inferior chest, Dr. [name] Sensing  . Shingles     Past Surgical History:  Procedure Laterality Date  . BACK SURGERY  1993  . CARDIAC CATHETERIZATION     left, w/ coronary angiography and left ventriculograpy  . LEFT HEART CATHETERIZATION WITH CORONARY ANGIOGRAM N/A 06/09/2013   Procedure: LEFT HEART CATHETERIZATION WITH CORONARY ANGIOGRAM;  Surgeon: [name], MD;  Location: [facility] CATH LAB;  Service: Cardiovascular;  Laterality: N/A;  . PERCUTANEOUS CORONARY STENT INTERVENTION (PCI-S) N/A 06/12/2013   Procedure: PERCUTANEOUS CORONARY STENT INTERVENTION (PCI-S);  Surgeon: [name], MD;  Location: [facility] CATH LAB;  Service: Cardiovascular;  Laterality: N/A;  prox LAD  . PTCA     w/ placement of drugeluting stent in the distal right coronary artery, and distal Cardiologist: [name]    Social History   Socioeconomic History  . Marital status: Married    Spouse name: Not on file  . Number of children: 3  . Years of education: Not on file  . Highest education level: Not on file  Occupational History  . Occupation: retired  Scientific laboratory technician  . Financial resource strain: Not on file  . Food insecurity:    Worry: Not on file    Inability: Not on file  . Transportation  needs:    Medical: Not on file    Non-medical: Not on file  Tobacco Use  . Smoking status: Former Smoker    Packs/day: 1.50    Years: 39.00    Pack years: 58.50    Types: Cigarettes    Last attempt to quit: 08/24/1992    Years since quitting: 25.7  . Smokeless tobacco: Never Used  Substance and Sexual Activity  . Alcohol use: No  . Drug use: No  . Sexual activity: Not on file  Lifestyle  . Physical activity:    Days per week: Not on file    Minutes per session: Not on file  . Stress: Not on file  Relationships  . Social connections:    Talks on phone: Not on file    Gets together: Not on file    Attends religious service: Not on file    Active member of club or organization: Not on file    Attends meetings of clubs or organizations: Not on file    Relationship status: Not on file  . Intimate partner violence:    Fear of current or ex partner: Not on file    Emotionally abused: Not on file    Physically abused: Not on file    Forced sexual activity: Not on file  Other Topics Concern  .  Not on file  Social History Narrative   Lives w/ wife    3 kids, 110 Gk-GGK      Allergies as of 05/24/2018   No Known Allergies     Medication List        Accurate as of 05/24/18  3:43 PM. Always use your most recent med list.          ACAPELLA Misc Use as directed   amLODipine 2.5 MG tablet Commonly known as:  NORVASC Take 1 tablet (2.5 mg total) by mouth daily.   aspirin 81 MG tablet Take 1 tablet (81 mg total) by mouth daily.   clopidogrel 75 MG tablet Commonly known as:  PLAVIX Take 1 tablet (75 mg total) by mouth daily.   famotidine 20 MG tablet Commonly known as:  PEPCID TAKE 1 TABLET BY MOUTH AT BEDTIME   glucose blood test strip Check blood sugar no more than twice daily   isosorbide mononitrate 30 MG 24 hr tablet Commonly known as:  IMDUR Take 1 tablet (30 mg total) by mouth 2 (two) times daily.   meclizine 12.5 MG tablet Commonly known as:   ANTIVERT Take 1 tablet (12.5 mg total) 3 (three) times daily as needed by mouth for dizziness.   metFORMIN 500 MG tablet Commonly known as:  GLUCOPHAGE Take 1 tablet (500 mg total) by mouth daily.   mometasone-formoterol 200-5 MCG/ACT Aero Commonly known as:  DULERA Inhale 2 puffs into the lungs 2 (two) times daily.   multivitamin with minerals Tabs tablet Take 1 tablet by mouth daily.   nitroGLYCERIN 0.4 MG SL tablet Commonly known as:  NITROSTAT Place 1 tablet (0.4 mg total) under the tongue every 5 (five) minutes as needed for chest pain (up to 3 doses only.).   pravastatin 40 MG tablet Commonly known as:  PRAVACHOL TAKE 1 TABLET BY MOUTH ONCE DAILY   PROAIR HFA 108 (90 Base) MCG/ACT inhaler Generic drug:  albuterol Inhale 1-2 puffs into the lungs every 4 (four) hours as needed for wheezing or shortness of breath.   telmisartan 20 MG tablet Commonly known as:  MICARDIS Take 1 tablet (20 mg total) by mouth daily.          Objective:   Physical Exam BP 132/70 (BP Location: Left Arm, Patient Position: Sitting, Cuff Size: Normal)   Pulse 66   Temp 97.6 F (36.4 C) (Oral)   Resp 16   Ht 5\' 9"  (1.753 m)   Wt 174 lb 6 oz (79.1 kg)   SpO2 96%   BMI 25.75 kg/m  General:   Well developed, NAD, see BMI.  HEENT:  Normocephalic . Face symmetric, atraumatic  Small amount of soft wax worse on the left. HOH despite using hearing aids Neurologic:  alert & oriented X3.  Speech normal, gait appropriate for age and unassisted Psych--  Cognition and judgment appear intact.  Cooperative with normal attention span and concentration.  Behavior appropriate. No anxious or depressed appearing.      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 Ronnald Ramp Chronic hoarseness, status post ENT eval ~ 2012 Normal DEXA 2012  PLAN: Cerumen impaction: Mild, I was able to remove wax from both ears, much more from the left  with a spoon and a Q-tip.  he still has a small amount of wax on the left but he feels much better. Continue using wax softener OTC.  Call when needed.

## 2018-05-24 NOTE — Progress Notes (Signed)
Pre visit review using our clinic review tool, if applicable. No additional management support is needed unless otherwise documented below in the visit note. 

## 2018-05-25 NOTE — Assessment & Plan Note (Signed)
Cerumen impaction: Mild, I was able to remove wax from both ears, much more from the left with a spoon and a Q-tip.  he still has a small amount of wax on the left but he feels much better. Continue using wax softener OTC.  Call when needed.

## 2018-06-07 ENCOUNTER — Encounter: Payer: Self-pay | Admitting: Family

## 2018-06-07 ENCOUNTER — Ambulatory Visit (INDEPENDENT_AMBULATORY_CARE_PROVIDER_SITE_OTHER): Payer: PPO | Admitting: Family

## 2018-06-07 VITALS — BP 153/68 | HR 76 | Temp 98.7°F | Resp 16 | Ht 69.0 in | Wt 176.0 lb

## 2018-06-07 DIAGNOSIS — R300 Dysuria: Secondary | ICD-10-CM | POA: Diagnosis not present

## 2018-06-07 DIAGNOSIS — N3000 Acute cystitis without hematuria: Secondary | ICD-10-CM | POA: Diagnosis not present

## 2018-06-07 LAB — POC URINALSYSI DIPSTICK (AUTOMATED)
BILIRUBIN UA: NEGATIVE
Glucose, UA: NEGATIVE
KETONES UA: NEGATIVE
Nitrite, UA: NEGATIVE
Protein, UA: POSITIVE — AB
SPEC GRAV UA: 1.015 (ref 1.010–1.025)
Urobilinogen, UA: 0.2 E.U./dL
pH, UA: 6 (ref 5.0–8.0)

## 2018-06-07 MED ORDER — CIPROFLOXACIN HCL 500 MG PO TABS: 500.0000 mg | ORAL_TABLET | Freq: Two times a day (BID) | ORAL | 0 refills | Status: DC

## 2018-06-07 NOTE — Progress Notes (Signed)
Subjective:    Patient ID: Phillip Jordan, male    DOB: 12/08/1929, 84 y.o.   MRN: [id number]  HPI  Reports that last Friday he "did not feel well at all."  Had temp 99.  Since that time he ahs had a hard time emptying his bladder.  Denies myalgia, nausea/vomitting.  Thinks tha the had a touch of blood in the urine.  Notes that the first bit of urine looks "pussy."  Had some dysuria on Friday.  No dysuria today.  Just frequency.   Review of Systems See HPI  Past Medical History:  Diagnosis Date  . Allergic rhinitis   . Aortic dissection ([place])    f/u by cards - AAA with distal dissection.  [name] CAD (coronary artery disease)    a. s/p MI 1992. b. Prior hx stenting to RCA/Cx. b. 05/2013: [place] s/p DES to distal RCA then staged DES to prox LAD.   [name] COPD with asthma ([place])   . Diabetes mellitus   . Hyperlipidemia   . Lichen planus    Right midline inferior chest, Dr. [name] Sensing  . Shingles      Social History   Socioeconomic History  . Marital status: Married    Spouse name: Not on file  . Number of children: 3  . Years of education: Not on file  . Highest education level: Not on file  Occupational History  . Occupation: retired  Scientific laboratory technician  . Financial resource strain: Not on file  . Food insecurity:    Worry: Not on file    Inability: Not on file  . Transportation needs:    Medical: Not on file    Non-medical: Not on file  Tobacco Use  . Smoking status: Former Smoker    Packs/day: 1.50    Years: 39.00    Pack years: 58.50    Types: Cigarettes    Last attempt to quit: 08/24/1992    Years since quitting: 25.8  . Smokeless tobacco: Never Used  Substance and Sexual Activity  . Alcohol use: No  . Drug use: No  . Sexual activity: Not on file  Lifestyle  . Physical activity:    Days per week: Not on file    Minutes per session: Not on file  . Stress: Not on file  Relationships  . Social connections:    Talks on phone: Not on file    Gets together: Not on file   Attends religious service: Not on file    Active member of club or organization: Not on file    Attends meetings of clubs or organizations: Not on file    Relationship status: Not on file  . Intimate partner violence:    Fear of current or ex partner: Not on file    Emotionally abused: Not on file    Physically abused: Not on file    Forced sexual activity: Not on file  Other Topics Concern  . Not on file  Social History Narrative   Lives w/ wife    3 kids, 107 Gk-GGK    Past Surgical History:  Procedure Laterality Date  . BACK SURGERY  1993  . CARDIAC CATHETERIZATION     left, w/ coronary angiography and left ventriculograpy  . LEFT HEART CATHETERIZATION WITH CORONARY ANGIOGRAM N/A 06/09/2013   Procedure: LEFT HEART CATHETERIZATION WITH CORONARY ANGIOGRAM;  Surgeon: [name], MD;  Location[facility] CATH LAB;  Service: Cardiovascular;  Laterality: N/A;  . PERCUTANEOUS CORONARY STENT INTERVENTION (  PCI-S) N/A 06/12/2013   Procedure: PERCUTANEOUS CORONARY STENT INTERVENTION (PCI-S);  Surgeon: [name], MD;  Location: [facility] CATH LAB;  Service: Cardiovascular;  Laterality: N/A;  prox LAD  . PTCA     w/ placement of drugeluting stent in the distal right coronary artery, and distal Cardiologist: [name]    Family History  Problem Relation Age of Onset  . Asthma Mother   . Heart disease Mother        CHF  . Cancer Mother        melanoma  . Heart disease Father        MI  . Prostate cancer Neg Hx   . Colon cancer Neg Hx     No Known Allergies  Current Outpatient Medications on File Prior to Visit  Medication Sig Dispense Refill  . albuterol (PROAIR HFA) 108 (90 Base) MCG/ACT inhaler Inhale 1-2 puffs into the lungs every 4 (four) hours as needed for wheezing or shortness of breath.    [name] amLODipine (NORVASC) 2.5 MG tablet Take 1 tablet (2.5 mg total) by mouth daily. 90 tablet 1  . aspirin 81 MG tablet Take 1 tablet (81 mg total) by mouth daily.    .  clopidogrel (PLAVIX) 75 MG tablet Take 1 tablet (75 mg total) by mouth daily. 90 tablet 2  . famotidine (PEPCID) 20 MG tablet TAKE 1 TABLET BY MOUTH AT BEDTIME 90 tablet 3  . glucose blood (FREESTYLE TEST STRIPS) test strip Check blood sugar no more than twice daily 100 each 12  . isosorbide mononitrate (IMDUR) 30 MG 24 hr tablet Take 1 tablet (30 mg total) by mouth 2 (two) times daily. 180 tablet 1  . meclizine (ANTIVERT) 12.5 MG tablet Take 1 tablet (12.5 mg total) 3 (three) times daily as needed by mouth for dizziness. 30 tablet 0  . metFORMIN (GLUCOPHAGE) 500 MG tablet Take 1 tablet (500 mg total) by mouth daily. 90 tablet 1  . Misc. Devices (ACAPELLA) MISC Use as directed 1 each 0  . mometasone-formoterol (DULERA) 200-5 MCG/ACT AERO Inhale 2 puffs into the lungs 2 (two) times daily. 1 Inhaler 0  . Multiple Vitamin (MULTIVITAMIN WITH MINERALS) TABS tablet Take 1 tablet by mouth daily.    . nitroGLYCERIN (NITROSTAT) 0.4 MG SL tablet Place 1 tablet (0.4 mg total) under the tongue every 5 (five) minutes as needed for chest pain (up to 3 doses only.). 25 tablet 5  . pravastatin (PRAVACHOL) 40 MG tablet TAKE 1 TABLET BY MOUTH ONCE DAILY 90 tablet 2  . telmisartan (MICARDIS) 20 MG tablet Take 1 tablet (20 mg total) by mouth daily. 90 tablet 1   No current facility-administered medications on file prior to visit.     BP (!) 153/68 (BP Location: Left Arm, Patient Position: Sitting, Cuff Size: Small)   Pulse 76   Temp 98.7 F (37.1 C) (Oral)   Resp 16   Ht 5\' 9"  (1.753 m)   Wt 176 lb (79.8 kg)   SpO2 98%   BMI 25.99 kg/m       Objective:   Physical Exam  Constitutional: He is oriented to person, place, and time. He appears well-developed and well-nourished. No distress.  HENT:  Head: Normocephalic and atraumatic.  Cardiovascular: Normal rate and regular rhythm.  No murmur heard. Pulmonary/Chest: Effort normal and breath sounds normal. No respiratory distress. He has no wheezes. He has  no rales.  Abdominal: Soft. He exhibits no mass.  Musculoskeletal: He exhibits no edema.  Neurological: He is alert and oriented to person, place, and time.  Skin: Skin is warm and dry.  Psychiatric: He has a normal mood and affect. His behavior is normal. Thought content normal.  GU: Neg CVAT.  No obvious bladder distension, no tenderness       Assessment & Plan:  UTI- UA + leuks, will start cipro, send for culture.  Pt is advised as follows: Call if new/worsening symptoms or if not improved in 2-3 days. Go to ER if you develop fever >101, confusion, nausea/vomiting, or inability to empty your bladder.

## 2018-06-07 NOTE — Patient Instructions (Signed)
Begin cipro twice daily. Call if new/worsening symptoms or if not improved in 2-3 days. Go to ER if you develop fever >101, confusion, nausea/vomiting, or inability to empty your bladder.

## 2018-06-09 ENCOUNTER — Encounter: Payer: Self-pay | Admitting: Family

## 2018-06-09 LAB — URINE CULTURE
MICRO NUMBER: 91238223
SPECIMEN QUALITY:: ADEQUATE

## 2018-06-10 LAB — HM DIABETES EYE EXAM

## 2018-06-14 ENCOUNTER — Telehealth: Payer: Self-pay | Admitting: *Deleted

## 2018-06-14 NOTE — Telephone Encounter (Signed)
Received Diabetic Eye Exam Report from [facility]; forwarded to provider/SLS 10/22

## 2018-06-15 ENCOUNTER — Ambulatory Visit: Payer: PPO | Admitting: Internal Medicine

## 2018-06-15 ENCOUNTER — Encounter: Payer: Self-pay | Admitting: Internal Medicine

## 2018-06-15 VITALS — BP 130/74 | HR 86 | Ht 69.0 in | Wt 173.0 lb

## 2018-06-15 DIAGNOSIS — J449 Chronic obstructive pulmonary disease, unspecified: Secondary | ICD-10-CM | POA: Diagnosis not present

## 2018-06-15 MED ORDER — PREDNISONE 10 MG PO TABS: ORAL_TABLET | ORAL | 0 refills | Status: DC

## 2018-06-15 NOTE — Progress Notes (Signed)
Subjective:     Patient ID: Phillip Jordan, male    DOB: [birth year]   MRN: [id number]    Brief patient profile:  84  year old white  male, ex-smoker (1ppd x 30 years, quit 1990s), diagnosed with COPD per hx by Dr [name] (fev1 1.38L/44%, ratio 51 in Jan 2009). C/w GOLD III  Criteria    History of Present Illness  05/25/2013 consult/ [name] re: sob/ prev eval MR with GOLD III COPD Chief Complaint  Patient presents with  . Pulmonary Consult    Referred per Dr. Kathlene November. The pt c/o DOE for the past 15 years, but worse for the past 6 months. He states that he gets SOB with walking up hills, but does okay with his exercise program 2-3 times per wk. He also c/o hoarseness and cough "for a long time"- cough is prod in the am with minimal clear sputum.      All his days are about the same in terms of activity tolerance  Breathing got better p quit smoking then worse again starting around mid 1990s Better on dulera, no better on spiriva ? Worse hoarseness so stopped it Work on inhaler technique:    Pantoprazole (protonix) 40 mg   Take 30-60 min before first meal of the day and Pepcid 20 mg one bedtime until return to office - this is the best way to tell whether stomach acid is contributing to your problem.  GERD diet    03/12/2017  f/u ov/Phillip Jordan re:   COPD  III dulera 200 / singulair/ gerd rx  Chief Complaint  Patient presents with  . Follow-up    Breathing has improved back to his normal baseline. No new co's. He has not had to use rescue inhaler.    doe still = mmrc3  rec Plan A = Automatic = Dulera 200 Take 2 puffs first thing in am and then another 2 puffs about 12 hours later Also  Pantoprazole (protonix) 40 mg   Take  30-60 min before first meal of the day and Pepcid (famotidine)  20 mg one @  bedtime until return to office - this is the best way to tell whether stomach acid is contributing to your problem.   Plan B = Backup Only use your albuterol as a rescue medication to be used if  you can't catch your breath by resting or doing a relaxed purse lip breathing pattern.  - The less you use it, the better it will work when you need it. - Ok to use the inhaler up to 2 puffs  every 4 hours if you must but call for appointment if use goes up over your usual need - Don't leave home without it !!  (think of it like the spare tire for your car)  Whenever coughing / wheezing/ short of breath >  Take protonix Take 30- 60 min before your first and last meals of the day and once you are better reduce back to protonix 40 mg Take 30-60 min before first meal of the day  Plan C= Crisis If needing the albuterol (proair) more than a few times a week> Prednisone 10 mg take  4 each am x 2 days,   2 each am x 2 days,  1 each am x 2 days and stop     06/14/2017  f/u ov/Phillip Jordan re: copd III on dulera 200/ singulair/ gerd rx/ pred prn used x one cycle since last ov Chief Complaint  Patient presents with  .  Follow-up    He states his breathing is doing well. He rarely uses his albuterol inhaler.    doe improved to  [facility] [place] = can't walk a nl pace on a flat grade s sob but does fine slow and flat eg does fine walking slow anywhere and also [facility]  Sleeps ok  Watery rhinitis worse p meals / not sure singulair helping rec Ok to try off singulair to see if worse nasal or lower respiratory symptoms and if so restart     06/15/2018  f/u ov/Phillip Jordan re:  Copd II/ maint rx = dulera 200 2bid / no worse off singulair,  Main concern is cost of care  Chief Complaint  Patient presents with  . Follow-up    Breathing is overall doig well. He states he has been wheezing some for approx 1 wk. He is using his albuterol once daily on average.    Dyspnea:  Works out 3 x weekly on treadmill 20 min x 67mph at 2 degrees  Cough: min/ clear mucus  no am flare despite runny nose x one week prior to [employer]: ok flat bed/ one pillow  SABA use: once a day most - has not used contingency prednisone  in past 6 m 02: none     No obvious day to day or daytime variability or assoc excess/ purulent sputum or mucus plugs or hemoptysis or cp or chest tightness,  or overt sinus or hb symptoms.   Sleeping  without nocturnal  or early am exacerbation  of respiratory  c/o's or need for noct saba. Also denies any obvious fluctuation of symptoms with weather or environmental changes or other aggravating or alleviating factors except as outlined above   No unusual exposure hx or h/o childhood pna/ asthma or knowledge of premature birth.  Current Allergies, Complete Past Medical History, Past Surgical History, Family History, and Social History were reviewed in [employer] record.  ROS  The following are not active complaints unless bolded Hoarseness, sore throat, dysphagia, dental problems, itching, sneezing,  nasal congestion or discharge of excess mucus or purulent secretions, ear ache,   fever, chills, sweats, unintended wt loss or wt gain, classically pleuritic or exertional cp,  orthopnea pnd or arm/hand swelling  or leg swelling, presyncope, palpitations, abdominal pain, anorexia, nausea, vomiting, diarrhea  or change in bowel habits or change in bladder habits, change in stools or change in urine, dysuria, hematuria,  rash, arthralgias, visual complaints, headache, numbness, weakness or ataxia or problems with walking or coordination,  change in mood or  memory.        Current Med  Medication Sig  . albuterol (PROAIR HFA) 108 (90 Base) MCG/ACT inhaler Inhale 1-2 puffs into the lungs every 4 (four) hours as needed for wheezing or shortness of breath.  [name] amLODipine (NORVASC) 2.5 MG tablet Take 1 tablet (2.5 mg total) by mouth daily.  [name] aspirin 81 MG tablet Take 1 tablet (81 mg total) by mouth daily.  . clopidogrel (PLAVIX) 75 MG tablet Take 1 tablet (75 mg total) by mouth daily.  . famotidine (PEPCID) 20 MG tablet TAKE 1 TABLET BY MOUTH AT BEDTIME  . glucose blood (FREESTYLE  TEST STRIPS) test strip Check blood sugar no more than twice daily  . isosorbide mononitrate (IMDUR) 30 MG 24 hr tablet Take 1 tablet (30 mg total) by mouth 2 (two) times daily.  . meclizine (ANTIVERT) 12.5 MG tablet Take 1 tablet (12.5  mg total) 3 (three) times daily as needed by mouth for dizziness.  . metFORMIN (GLUCOPHAGE) 500 MG tablet Take 1 tablet (500 mg total) by mouth daily.  . Misc. Devices (ACAPELLA) MISC Use as directed  . mometasone-formoterol (DULERA) 200-5 MCG/ACT AERO Inhale 2 puffs into the lungs 2 (two) times daily.  . Multiple Vitamin (MULTIVITAMIN WITH MINERALS) TABS tablet Take 1 tablet by mouth daily.  . nitroGLYCERIN (NITROSTAT) 0.4 MG SL tablet Place 1 tablet (0.4 mg total) under the tongue every 5 (five) minutes as needed for chest pain (up to 3 doses only.).  [name] pravastatin (PRAVACHOL) 40 MG tablet TAKE 1 TABLET BY MOUTH ONCE DAILY  . telmisartan (MICARDIS) 20 MG tablet Take 1 tablet (20 mg total) by mouth daily.                Objective:   Physical Exam   amb mod hoarse wm nad   Vital signs reviewed - Note on arrival 02 sats  96% on RA      06/15/2018       173  12/13/2017         175  06/14/2017       173  03/12/2017         170  01/15/2017         172  11/30/2016           175      HEENT: nl dentition / oropharynx. Nl external ear canals without cough reflex -  Mod bilateral non-specific turbinate edema     NECK :  without JVD/Nodes/TM/ nl carotid upstrokes bilaterally   LUNGS: no acc muscle use,  Mod barrel  contour chest wall with bilateral  Distant bs s audible wheeze and  without cough on insp or exp maneuver and mod  Hyperresonant  to  percussion bilaterally     CV:  RRR  no s3 or murmur or increase in P2, and no edema   ABD:  soft and nontender with pos mid  insp Hoover's  in the supine position. No bruits or organomegaly appreciated, bowel sounds nl  MS:   Nl gait/  ext warm without deformities, calf tenderness, cyanosis or clubbing No  obvious joint restrictions   SKIN: warm and dry without lesions    NEURO:  alert, approp, nl sensorium with  no motor or cerebellar deficits apparent.            Assessment & Plan:

## 2018-06-15 NOTE — Patient Instructions (Signed)
If cost is an issue with your inhaler, bring your drug formulary to next visit    Please schedule a follow up visit in 3 months but call sooner if needed

## 2018-06-16 ENCOUNTER — Encounter: Payer: Self-pay | Admitting: Internal Medicine

## 2018-06-17 ENCOUNTER — Encounter: Payer: Self-pay | Admitting: Internal Medicine

## 2018-06-17 NOTE — Assessment & Plan Note (Signed)
-   PFT's 07/02/11  FEV1  1.41 (55%) ratio 66 and 12% better p B2 with DLCO 96% - PFTs  07/05/2013 FEV1  1.05 and 1.31 p B2 (24%) with DLCO 59 corrects to 84%  - Spiriva and other mdi resulted in hoarseness - hfa 05/25/2013 75% p coaching>  90% 07/05/13  - added gerd rx 05/25/2013 >> improved 07/05/13  - rx dulera 200 2bid 07/05/13 > better 10/03/2013   - 10/08/2015 added GERD rx back due to pm strangling/ hoarseness  - 10/25/2015 added flutter - Rehab completed Aug 2017  - PFT's  05/21/2016  FEV1 1.23 (50 % ) ratio 45  p 32 % improvement from saba p dulera 200 prior to study with DLCO  56/57 % corrects to 72  % for alv volume    - added pred x 6 days as backup plan prn as of 03/12/2017   - 06/15/2018  After extensive coaching inhaler device,  effectiveness =    90%   Despite mile flare doing well on just dulera 200 bid and tol lama poorly so no change in rx needed flow now  I had an extended discussion with the patient reviewing all relevant studies completed to date and  lasting 15 to 20 minutes of a 25 minute visit on the following ongoing concerns:  Formulary restrictions will be an ongoing challenge for the forseable future and I would be happy to pick an alternative if the pt will first  provide me a list of them but pt  will need to return here for training for any new device that is required eg dpi vs hfa vs respimat.    In meantime we can always provide samples so the patient never runs out of any needed respiratory medications.   See device teaching which extended face to face time for this visit    Each maintenance medication was reviewed in detail including most importantly the difference between maintenance and as needed and under what circumstances the prns are to be used.  Please see AVS for specific  Instructions which are unique to this visit and I personally typed out  which were reviewed in detail in writing with the patient and a copy provided.    F/u can be q 3 m, sooner prn

## 2018-06-20 ENCOUNTER — Other Ambulatory Visit: Payer: Self-pay | Admitting: Internal Medicine

## 2018-07-01 ENCOUNTER — Telehealth: Payer: Self-pay | Admitting: *Deleted

## 2018-07-01 NOTE — Telephone Encounter (Signed)
Copied from [place] [phone]. Topic: General - Inquiry >> Jul 01, 2018 10:29 AM Margot Ables wrote: Reason for CRM: Pt called stating he received a msg in mychart from NP Earlie Counts that his potassium needs to be rechecked. The only lab notes I found for mychart were about ecoli after last OV. Please advise and if pt is supposed to come in for repeat labs >> Jul 01, 2018 10:39 AM Damita Dunnings, CMA wrote: Do you know anything about this?

## 2018-07-01 NOTE — Telephone Encounter (Signed)
Melissa -- please see below message and advise?

## 2018-07-02 NOTE — Telephone Encounter (Signed)
I only contacted him about his urine. He has not had a recent potassium drawn.

## 2018-07-04 NOTE — Telephone Encounter (Signed)
Notified pt of below and he voices understanding. 

## 2018-08-08 ENCOUNTER — Other Ambulatory Visit: Payer: Self-pay | Admitting: Internal Medicine

## 2018-09-01 ENCOUNTER — Encounter: Payer: Self-pay | Admitting: Internal Medicine

## 2018-09-01 ENCOUNTER — Ambulatory Visit (INDEPENDENT_AMBULATORY_CARE_PROVIDER_SITE_OTHER): Payer: PPO | Admitting: Internal Medicine

## 2018-09-01 VITALS — BP 124/76 | HR 70 | Temp 97.9°F | Resp 16 | Ht 69.0 in | Wt 176.1 lb

## 2018-09-01 DIAGNOSIS — R202 Paresthesia of skin: Secondary | ICD-10-CM | POA: Diagnosis not present

## 2018-09-01 DIAGNOSIS — E785 Hyperlipidemia, unspecified: Secondary | ICD-10-CM | POA: Diagnosis not present

## 2018-09-01 DIAGNOSIS — E11319 Type 2 diabetes mellitus with unspecified diabetic retinopathy without macular edema: Secondary | ICD-10-CM | POA: Diagnosis not present

## 2018-09-01 DIAGNOSIS — I10 Essential (primary) hypertension: Secondary | ICD-10-CM | POA: Diagnosis not present

## 2018-09-01 MED ORDER — ZOSTER VAC RECOMB ADJUVANTED 50 MCG/0.5ML IM SUSR: 0.5000 mL | Freq: Once | INTRAMUSCULAR | 1 refills | Status: AC

## 2018-09-01 NOTE — Progress Notes (Signed)
Pre visit review using our clinic review tool, if applicable. No additional management support is needed unless otherwise documented below in the visit note. 

## 2018-09-01 NOTE — Patient Instructions (Signed)
GO TO THE FRONT DESK Schedule labs to be done, fasting, in the next few days  Schedule your next appointment for a routine checkup in 4 to 6 months   Diabetes Mellitus and [place] care is an important part of your health, especially when you have diabetes. Diabetes may cause you to have problems because of poor blood flow (circulation) to your feet and legs, which can cause your skin to:  Become thinner and drier.  Break more easily.  Heal more slowly.  Peel and crack. You may also have nerve damage (neuropathy) in your legs and feet, causing decreased feeling in them. This means that you may not notice minor injuries to your feet that could lead to more serious problems. Noticing and addressing any potential problems early is the best way to prevent future foot problems. How to care for your feet Foot hygiene  Wash your feet daily with warm water and mild soap. Do not use hot water. Then, pat your feet and the areas between your toes until they are completely dry. Do not soak your feet as this can dry your skin.  Trim your toenails straight across. Do not dig under them or around the cuticle. File the edges of your nails with an emery board or nail file.  Apply a moisturizing lotion or petroleum jelly to the skin on your feet and to dry, brittle toenails. Use lotion that does not contain alcohol and is unscented. Do not apply lotion between your toes. Shoes and socks  Wear clean socks or stockings every day. Make sure they are not too tight. Do not wear knee-high stockings since they may decrease blood flow to your legs.  Wear shoes that fit properly and have enough cushioning. Always look in your shoes before you put them on to be sure there are no objects inside.  To break in new shoes, wear them for just a few hours a day. This prevents injuries on your feet. Wounds, scrapes, corns, and calluses  Check your feet daily for blisters, cuts, bruises, sores, and redness.  If you cannot see the bottom of your feet, use a mirror or ask someone for help.  Do not cut corns or calluses or try to remove them with medicine.  If you find a minor scrape, cut, or break in the skin on your feet, keep it and the skin around it clean and dry. You may clean these areas with mild soap and water. Do not clean the area with peroxide, alcohol, or iodine.  If you have a wound, scrape, corn, or callus on your foot, look at it several times a day to make sure it is healing and not infected. Check for: ? Redness, swelling, or pain. ? Fluid or blood. ? Warmth. ? Pus or a bad smell. General instructions  Do not cross your legs. This may decrease blood flow to your feet.  Do not use heating pads or hot water bottles on your feet. They may burn your skin. If you have lost feeling in your feet or legs, you may not know this is happening until it is too late.  Protect your feet from hot and cold by wearing shoes, such as at the beach or on hot pavement.  Schedule a complete foot exam at least once a year (annually) or more often if you have foot problems. If you have foot problems, report any cuts, sores, or bruises to your health care provider immediately. Contact a health care  provider if:  You have a medical condition that increases your risk of infection and you have any cuts, sores, or bruises on your feet.  You have an injury that is not healing.  You have redness on your legs or feet.  You feel burning or tingling in your legs or feet.  You have pain or cramps in your legs and feet.  Your legs or feet are numb.  Your feet always feel cold.  You have pain around a toenail. Get help right away if:  You have a wound, scrape, corn, or callus on your foot and: ? You have pain, swelling, or redness that gets worse. ? You have fluid or blood coming from the wound, scrape, corn, or callus. ? Your wound, scrape, corn, or callus feels warm to the touch. ? You have pus or  a bad smell coming from the wound, scrape, corn, or callus. ? You have a fever. ? You have a red line going up your leg. Summary  Check your feet every day for cuts, sores, red spots, swelling, and blisters.  Moisturize feet and legs daily.  Wear shoes that fit properly and have enough cushioning.  If you have foot problems, report any cuts, sores, or bruises to your health care provider immediately.  Schedule a complete foot exam at least once a year (annually) or more often if you have foot problems. This information is not intended to replace advice given to you by your health care provider. Make sure you discuss any questions you have with your health care provider. Document Released: 08/07/2000 Document Revised: 09/22/2017 Document Reviewed: 09/11/2016 Elsevier Interactive Patient Education  2019 [employer].

## 2018-09-01 NOTE — Assessment & Plan Note (Signed)
DM: With retinopathy and neuropathy.  Feet exam confirms neuropathy.  He has a callus but no evidence of ulcer or infection, reinforced the need to monitor his feet every night. Continue metformin, check A1c HTN: Currently on amlodipine, Imdur, Micardis.  Check CMP and CBC Hyperlipidemia: Currently on Pravachol, due for labs. COPD: Seen by pulmonary 05/2018, had a mild flareup, was Rx prednisone Paresthesias, tip of the fingers: Normal vascular exam, no neck pain, no motor deficits.  Neuropathy?.  Check a folic acid- Q01, otherwise observation for now Diagnosed with UTI,Treated with appropriate antibiotics, now asymptomatic Preventive care: Shingrix printed.  Pros and cons discussed. RTC 5 to 6 months.

## 2018-09-01 NOTE — Progress Notes (Signed)
Subjective:    Patient ID: Phillip Jordan, male    DOB: 26-Oct-1929, 85 y.o.   MRN: [id number]  DOS:  09/01/2018 Type of visit - description: Routine office visit DM: Good compliance to medication HTN: Good compliance with medication, due for labs High cholesterol: Good compliance with medication, no apparent side effects Reports a feeling of tingling in all the fingertips, is not a on and off problem, is mostly persistent.  No associated neck pain, no  more proximal symptoms or motor deficits. His feet continue to feel cold.   COPD: Note from pulmonary reviewed    Review of Systems Denies chest pain, pulmonary symptoms at baseline No edema No nausea, vomiting, diarrhea.  No blood in the stools. Occasionally his balance is slightly off, he thinks is because he has not been exercising much but plans to go back to the gym. History of retinopathy, vision is a stable and satisfactory.  Past Medical History:  Diagnosis Date  . Allergic rhinitis   . Aortic dissection ([place])    f/u by cards - AAA with distal dissection.  [name] CAD (coronary artery disease)    a. s/p MI 1992. b. Prior hx stenting to RCA/Cx. b. 05/2013: [place] s/p DES to distal RCA then staged DES to prox LAD.   [name] COPD with asthma ([place])   . Diabetes mellitus   . Hyperlipidemia   . Lichen planus    Right midline inferior chest, Dr. [name] Sensing  . Shingles     Past Surgical History:  Procedure Laterality Date  . BACK SURGERY  1993  . CARDIAC CATHETERIZATION     left, w/ coronary angiography and left ventriculograpy  . LEFT HEART CATHETERIZATION WITH CORONARY ANGIOGRAM N/A 06/09/2013   Procedure: LEFT HEART CATHETERIZATION WITH CORONARY ANGIOGRAM;  Surgeon: [name], MD;  Location: [facility] CATH LAB;  Service: Cardiovascular;  Laterality: N/A;  . PERCUTANEOUS CORONARY STENT INTERVENTION (PCI-S) N/A 06/12/2013   Procedure: PERCUTANEOUS CORONARY STENT INTERVENTION (PCI-S);  Surgeon: [name], MD;  Location: [facility]  CATH LAB;  Service: Cardiovascular;  Laterality: N/A;  prox LAD  . PTCA     w/ placement of drugeluting stent in the distal right coronary artery, and distal Cardiologist: [name]    Social History   Socioeconomic History  . Marital status: Married    Spouse name: Not on file  . Number of children: 3  . Years of education: Not on file  . Highest education level: Not on file  Occupational History  . Occupation: retired  Scientific laboratory technician  . Financial resource strain: Not on file  . Food insecurity:    Worry: Not on file    Inability: Not on file  . Transportation needs:    Medical: Not on file    Non-medical: Not on file  Tobacco Use  . Smoking status: Former Smoker    Packs/day: 1.50    Years: 39.00    Pack years: 58.50    Types: Cigarettes    Last attempt to quit: 08/24/1992    Years since quitting: 26.0  . Smokeless tobacco: Never Used  Substance and Sexual Activity  . Alcohol use: No  . Drug use: No  . Sexual activity: Not on file  Lifestyle  . Physical activity:    Days per week: Not on file    Minutes per session: Not on file  . Stress: Not on file  Relationships  . Social connections:    Talks on phone: Not on file  Gets together: Not on file    Attends religious service: Not on file    Active member of club or organization: Not on file    Attends meetings of clubs or organizations: Not on file    Relationship status: Not on file  . Intimate partner violence:    Fear of current or ex partner: Not on file    Emotionally abused: Not on file    Physically abused: Not on file    Forced sexual activity: Not on file  Other Topics Concern  . Not on file  Social History Narrative   Lives w/ wife    3 kids, 29 Gk-GGK      Allergies as of 09/01/2018   No Known Allergies     Medication List       Accurate as of September 01, 2018 10:25 PM. Always use your most recent med list.        ACAPELLA Misc Use as directed   amLODipine 2.5 MG  tablet Commonly known as:  NORVASC Take 1 tablet (2.5 mg total) by mouth daily.   aspirin 81 MG tablet Take 1 tablet (81 mg total) by mouth daily.   clopidogrel 75 MG tablet Commonly known as:  PLAVIX Take 1 tablet (75 mg total) by mouth daily. PLAVIX - Should not be held prior to cath procedure unless specifically ordered   famotidine 20 MG tablet Commonly known as:  PEPCID TAKE 1 TABLET BY MOUTH AT BEDTIME   glucose blood test strip Commonly known as:  FREESTYLE TEST STRIPS Check blood sugar no more than twice daily   isosorbide mononitrate 30 MG 24 hr tablet Commonly known as:  IMDUR Take 1 tablet (30 mg total) by mouth 2 (two) times daily.   meclizine 12.5 MG tablet Commonly known as:  ANTIVERT Take 1 tablet (12.5 mg total) 3 (three) times daily as needed by mouth for dizziness.   metFORMIN 500 MG tablet Commonly known as:  GLUCOPHAGE Take 1 tablet (500 mg total) by mouth daily with breakfast. Give w/food.   mometasone-formoterol 200-5 MCG/ACT Aero Commonly known as:  DULERA Inhale 2 puffs into the lungs 2 (two) times daily.   multivitamin with minerals Tabs tablet Take 1 tablet by mouth daily.   nitroGLYCERIN 0.4 MG SL tablet Commonly known as:  NITROSTAT Place 1 tablet (0.4 mg total) under the tongue every 5 (five) minutes as needed for chest pain (up to 3 doses only.).   pravastatin 40 MG tablet Commonly known as:  PRAVACHOL Take 1 tablet (40 mg total) by mouth daily.   PROAIR HFA 108 (90 Base) MCG/ACT inhaler Generic drug:  albuterol Inhale 1-2 puffs into the lungs every 4 (four) hours as needed for wheezing or shortness of breath.   telmisartan 20 MG tablet Commonly known as:  MICARDIS Take 1 tablet (20 mg total) by mouth daily.   Zoster Vaccine Adjuvanted injection Commonly known as:  SHINGRIX Inject 0.5 mLs into the muscle once for 1 dose.           Objective:   Physical Exam BP 124/76 (BP Location: Left Arm, Patient Position: Sitting, Cuff  Size: Normal)   Pulse 70   Temp 97.9 F (36.6 C) (Oral)   Resp 16   Ht 5\' 9"  (1.753 m)   Wt 176 lb 2 oz (79.9 kg)   SpO2 94%   BMI 26.01 kg/m  General:   Well developed, NAD, BMI noted.  HEENT:  Normocephalic . Face symmetric, atraumatic.  Mild  amount of wax in both ears. Lungs:  CTA B Normal respiratory effort, no intercostal retractions, no accessory muscle use. Heart: RRR,  no murmur.  no pretibial edema bilaterally Peripheral exam: Good pedal and radial pulses bilaterally.  Good capillary refill throughout.  Extremities are warm. Abdomen:  Not distended, soft, non-tender. No rebound or rigidity.  No mass or bruit Diabetic foot exam: No edema, pinprick examination decreased throughout the feet.  He has a callus at the base of the right great toe without evidence of ulceration or redness Neurologic:  alert & oriented X3.  Speech normal, gait appropriate for age and unassisted Psych--  Cognition and judgment appear intact.  Cooperative with normal attention span and concentration.  Behavior appropriate. No anxious or depressed appearing.     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 Ronnald Ramp Chronic hoarseness, status post ENT eval ~ 2012 Normal DEXA 2012  PLAN: DM: With retinopathy and neuropathy.  Feet exam confirms neuropathy.  He has a callus but no evidence of ulcer or infection, reinforced the need to monitor his feet every night. Continue metformin, check A1c HTN: Currently on amlodipine, Imdur, Micardis.  Check CMP and CBC Hyperlipidemia: Currently on Pravachol, due for labs. COPD: Seen by pulmonary 05/2018, had a mild flareup, was Rx prednisone Paresthesias, tip of the fingers: Normal vascular exam, no neck pain, no  motor deficits.  Neuropathy?.  Check a folic acid- G40, otherwise observation for now Diagnosed with UTI,Treated with appropriate antibiotics, now asymptomatic Preventive care: Shingrix printed.  Pros and cons discussed. RTC 5 to 6 months.

## 2018-09-02 NOTE — Addendum Note (Signed)
Addended by: Kelle Darting A on: 09/02/2018 02:49 PM   Modules accepted: Orders

## 2018-09-06 ENCOUNTER — Other Ambulatory Visit (INDEPENDENT_AMBULATORY_CARE_PROVIDER_SITE_OTHER): Payer: PPO

## 2018-09-06 DIAGNOSIS — I10 Essential (primary) hypertension: Secondary | ICD-10-CM | POA: Diagnosis not present

## 2018-09-06 DIAGNOSIS — E11319 Type 2 diabetes mellitus with unspecified diabetic retinopathy without macular edema: Secondary | ICD-10-CM | POA: Diagnosis not present

## 2018-09-06 DIAGNOSIS — R202 Paresthesia of skin: Secondary | ICD-10-CM | POA: Diagnosis not present

## 2018-09-06 DIAGNOSIS — E785 Hyperlipidemia, unspecified: Secondary | ICD-10-CM

## 2018-09-06 LAB — CBC WITH DIFFERENTIAL/PLATELET
Basophils Absolute: 0.1 10*3/uL (ref 0.0–0.1)
Basophils Relative: 1.2 % (ref 0.0–3.0)
Eosinophils Absolute: 0.4 10*3/uL (ref 0.0–0.7)
Eosinophils Relative: 4 % (ref 0.0–5.0)
HCT: 43 % (ref 39.0–52.0)
Hemoglobin: 14.5 g/dL (ref 13.0–17.0)
LYMPHS ABS: 2.4 10*3/uL (ref 0.7–4.0)
Lymphocytes Relative: 24.1 % (ref 12.0–46.0)
MCHC: 33.8 g/dL (ref 30.0–36.0)
MCV: 89.4 fl (ref 78.0–100.0)
MONO ABS: 1.1 10*3/uL — AB (ref 0.1–1.0)
Monocytes Relative: 10.9 % (ref 3.0–12.0)
Neutro Abs: 5.9 10*3/uL (ref 1.4–7.7)
Neutrophils Relative %: 59.8 % (ref 43.0–77.0)
Platelets: 299 10*3/uL (ref 150.0–400.0)
RBC: 4.82 Mil/uL (ref 4.22–5.81)
RDW: 13.3 % (ref 11.5–15.5)
WBC: 9.8 10*3/uL (ref 4.0–10.5)

## 2018-09-06 LAB — COMPREHENSIVE METABOLIC PANEL
ALT: 16 U/L (ref 0–53)
AST: 13 U/L (ref 0–37)
Albumin: 4.2 g/dL (ref 3.5–5.2)
Alkaline Phosphatase: 94 U/L (ref 39–117)
BUN: 16 mg/dL (ref 6–23)
CO2: 28 mEq/L (ref 19–32)
Calcium: 9.6 mg/dL (ref 8.4–10.5)
Chloride: 99 mEq/L (ref 96–112)
Creatinine, Ser: 0.98 mg/dL (ref 0.40–1.50)
GFR: 77.02 mL/min (ref >60.00)
Glucose, Bld: 125 mg/dL — ABNORMAL HIGH (ref 70–99)
Potassium: 5 mEq/L (ref 3.5–5.1)
Sodium: 136 mEq/L (ref 135–145)
Total Bilirubin: 0.4 mg/dL (ref 0.2–1.2)
Total Protein: 6.7 g/dL (ref 6.0–8.3)

## 2018-09-06 LAB — LIPID PANEL
CHOL/HDL RATIO: 4
Cholesterol: 157 mg/dL (ref 0–200)
HDL: 38.6 mg/dL — ABNORMAL LOW (ref >39.00)
LDL Cholesterol: 93 mg/dL (ref 0–99)
NonHDL: 118.03
Triglycerides: 126 mg/dL (ref 0.0–149.0)
VLDL: 25.2 mg/dL (ref 0.0–40.0)

## 2018-09-06 LAB — B12 AND FOLATE PANEL
Folate: >24 ng/mL
Vitamin B-12: 773 pg/mL (ref 211–911)

## 2018-09-06 LAB — HEMOGLOBIN A1C: Hgb A1c MFr Bld: 6.7 % — ABNORMAL HIGH (ref 4.6–6.5)

## 2018-09-15 ENCOUNTER — Ambulatory Visit: Payer: PPO | Admitting: Internal Medicine

## 2018-09-15 ENCOUNTER — Encounter: Payer: Self-pay | Admitting: Internal Medicine

## 2018-09-15 VITALS — BP 130/72 | HR 66 | Ht 69.0 in | Wt 178.6 lb

## 2018-09-15 DIAGNOSIS — J449 Chronic obstructive pulmonary disease, unspecified: Secondary | ICD-10-CM

## 2018-09-15 NOTE — Progress Notes (Signed)
Subjective:     Patient ID: Phillip Jordan, male    DOB: [birth year]   MRN: [id number]    Brief patient profile:  85  year old white  male, ex-smoker (1ppd x 30 years, quit 1990s), diagnosed with COPD per hx by Dr [name] (fev1 1.38L/44%, ratio 51 in Jan 2009). C/w GOLD III  Criteria    History of Present Illness  05/25/2013 consult/ [name] re: sob/ prev eval MR with GOLD III COPD Chief Complaint  Patient presents with  . Pulmonary Consult    Referred per Dr. Kathlene November. The pt c/o DOE for the past 15 years, but worse for the past 6 months. He states that he gets SOB with walking up hills, but does okay with his exercise program 2-3 times per wk. He also c/o hoarseness and cough "for a long time"- cough is prod in the am with minimal clear sputum.      All his days are about the same in terms of activity tolerance  Breathing got better p quit smoking then worse again starting around mid 1990s Better on dulera, no better on spiriva ? Worse hoarseness so stopped it Work on inhaler technique:    Pantoprazole (protonix) 40 mg   Take 30-60 min before first meal of the day and Pepcid 20 mg one bedtime until return to office - this is the best way to tell whether stomach acid is contributing to your problem.  GERD diet    03/12/2017  f/u ov/ re:   COPD  III dulera 200 / singulair/ gerd rx  Chief Complaint  Patient presents with  . Follow-up    Breathing has improved back to his normal baseline. No new co's. He has not had to use rescue inhaler.    doe still = mmrc3  rec Plan A = Automatic = Dulera 200 Take 2 puffs first thing in am and then another 2 puffs about 12 hours later Also  Pantoprazole (protonix) 40 mg   Take  30-60 min before first meal of the day and Pepcid (famotidine)  20 mg one @  bedtime until return to office - this is the best way to tell whether stomach acid is contributing to your problem.   Plan B = Backup Only use your albuterol as a rescue medication to be used if  you can't catch your breath by resting or doing a relaxed purse lip breathing pattern.  - The less you use it, the better it will work when you need it. - Ok to use the inhaler up to 2 puffs  every 4 hours if you must but call for appointment if use goes up over your usual need - Don't leave home without it !!  (think of it like the spare tire for your car)  Whenever coughing / wheezing/ short of breath >  Take protonix Take 30- 60 min before your first and last meals of the day and once you are better reduce back to protonix 40 mg Take 30-60 min before first meal of the day  Plan C= Crisis If needing the albuterol (proair) more than a few times a week> Prednisone 10 mg take  4 each am x 2 days,   2 each am x 2 days,  1 each am x 2 days and stop     06/14/2017  f/u ov/ re: copd III on dulera 200/ singulair/ gerd rx/ pred prn used x one cycle since last ov Chief Complaint  Patient presents with  .  Follow-up    He states his breathing is doing well. He rarely uses his albuterol inhaler.    doe improved to  [facility] = can't walk a nl pace on a flat grade s sob but does fine slow and flat eg does fine walking slow anywhere and also [facility]  Sleeps ok  Watery rhinitis worse p meals / not sure singulair helping rec Ok to try off singulair to see if worse nasal or lower respiratory symptoms and if so restart     06/15/2018  f/u ov/ re:  Copd II/ maint rx = dulera 200 2bid / no worse off singulair,  Main concern is cost of care  Chief Complaint  Patient presents with  . Follow-up    Breathing is overall doig well. He states he has been wheezing some for approx 1 wk. He is using his albuterol once daily on average.   Dyspnea:  Works out 3 x weekly on treadmill 20 min x 52mph at 2 degrees  Cough: min/ clear mucus  no am flare despite runny nose x one week prior to [place]: ok flat bed/ one pillow  SABA use: once a day most - has not used contingency prednisone in  past 6 m   09/15/2018  f/u ov/ re: copd II maint rx dulera 200 2bid/ has not needed prn pred (plan C on action plan) Chief Complaint  Patient presents with  . Follow-up    baseline SOB on exertion, cough with clear mucus  Dyspnea:   No longer doing treadmill but planning to start back  Cough: not much Sleeping: bed flat/ one pillow SABA use: 1-2 x per weekly  02:  None    No obvious day to day or daytime variability or assoc  purulent sputum or mucus plugs or hemoptysis or cp or chest tightness, subjective wheeze or overt sinus or hb symptoms.   Sleeping as aboe  without nocturnal  or early am exacerbation  of respiratory  c/o's or need for noct saba. Also denies any obvious fluctuation of symptoms with weather or environmental changes or other aggravating or alleviating factors except as outlined above   No unusual exposure hx or h/o childhood pna/ asthma or knowledge of premature birth.  Current Allergies, Complete Past Medical History, Past Surgical History, Family History, and Social History were reviewed in [employer] record.  ROS  The following are not active complaints unless bolded Hoarseness, sore throat, dysphagia, dental problems, itching, sneezing,  nasal congestion or discharge of excess mucus or purulent secretions, ear ache,   fever, chills, sweats, unintended wt loss or wt gain, classically pleuritic or exertional cp,  orthopnea pnd or arm/hand swelling  or leg swelling, presyncope, palpitations, abdominal pain, anorexia, nausea, vomiting, diarrhea  or change in bowel habits or change in bladder habits, change in stools or change in urine, dysuria, hematuria,  rash, arthralgias, visual complaints, headache, numbness, weakness or ataxia or problems with walking or coordination,  change in mood or  memory.        Current Meds  Medication Sig  . albuterol (PROAIR HFA) 108 (90 Base) MCG/ACT inhaler Inhale 1-2 puffs into the lungs every 4 (four)  hours as needed for wheezing or shortness of breath.  [name] amLODipine (NORVASC) 2.5 MG tablet Take 1 tablet (2.5 mg total) by mouth daily.  [name] aspirin 81 MG tablet Take 1 tablet (81 mg total) by mouth daily.  . clopidogrel (PLAVIX) 75  MG tablet Take 1 tablet (75 mg total) by mouth daily. PLAVIX - Should not be held prior to cath procedure unless specifically ordered  . famotidine (PEPCID) 20 MG tablet TAKE 1 TABLET BY MOUTH AT BEDTIME  . glucose blood (FREESTYLE TEST STRIPS) test strip Check blood sugar no more than twice daily  . isosorbide mononitrate (IMDUR) 30 MG 24 hr tablet Take 1 tablet (30 mg total) by mouth 2 (two) times daily.  . meclizine (ANTIVERT) 12.5 MG tablet Take 1 tablet (12.5 mg total) 3 (three) times daily as needed by mouth for dizziness.  . metFORMIN (GLUCOPHAGE) 500 MG tablet Take 1 tablet (500 mg total) by mouth daily with breakfast. Give w/food.  . Misc. Devices (ACAPELLA) MISC Use as directed  . mometasone-formoterol (DULERA) 200-5 MCG/ACT AERO Inhale 2 puffs into the lungs 2 (two) times daily.  . Multiple Vitamin (MULTIVITAMIN WITH MINERALS) TABS tablet Take 1 tablet by mouth daily.  . nitroGLYCERIN (NITROSTAT) 0.4 MG SL tablet Place 1 tablet (0.4 mg total) under the tongue every 5 (five) minutes as needed for chest pain (up to 3 doses only.).  [name] pravastatin (PRAVACHOL) 40 MG tablet Take 1 tablet (40 mg total) by mouth daily.  [name] telmisartan (MICARDIS) 20 MG tablet Take 1 tablet (20 mg total) by mouth daily.                  Objective:   Physical Exam   Amb hoarse wm nad    Vital signs reviewed - Note on arrival 02 sats  97% on RA    09/15/2018         178  06/15/2018       173  12/13/2017         175  06/14/2017       173  03/12/2017         170  01/15/2017         172  11/30/2016           175     HEENT: nl dentition / oropharynx. Nl external ear canals without cough reflex -  Mild bilateral non-specific turbinate edema     NECK :  without JVD/Nodes/TM/  nl carotid upstrokes bilaterally   LUNGS: no acc muscle use,  Mod barrel  contour chest wall with bilateral  Distant bs s audible wheeze and  without cough on insp or exp maneuver and mod  Hyperresonant  to  percussion bilaterally     CV:  RRR  no s3 or murmur or increase in P2, and no edema   ABD:  soft and nontender with pos mid insp Hoover's  in the supine position. No bruits or organomegaly appreciated, bowel sounds nl  MS:   Nl gait/  ext warm without deformities, calf tenderness, cyanosis or clubbing No obvious joint restrictions   SKIN: warm and dry without lesions    NEURO:  alert, approp, nl sensorium with  no motor or cerebellar deficits apparent.            Assessment & Plan:

## 2018-09-15 NOTE — Patient Instructions (Signed)
No change in your medications.     Please schedule a follow up visit in 6 months but call sooner if needed  

## 2018-09-18 ENCOUNTER — Encounter: Payer: Self-pay | Admitting: Internal Medicine

## 2018-09-18 NOTE — Assessment & Plan Note (Signed)
Quit smoking in 1994   - PFT's 07/02/11  FEV1  1.41 (55%) ratio 66 and 12% better p B2 with DLCO 96% - PFTs  07/05/2013 FEV1  1.05 and 1.31 p B2 (24%) with DLCO 59 corrects to 84%  - Spiriva and other mdi result in hoarseness - hfa 05/25/2013 75% p coaching>  90% 07/05/13  - added gerd rx 05/25/2013 >> improved 07/05/13  - rx dulera 200 2bid 07/05/13 > better 10/03/2013   - 10/08/2015 added GERD rx back due to pm strangling/ hoarseness  - 10/25/2015 added flutter - Rehab completed Aug 2017  - PFT's  05/21/2016  FEV1 1.23 (50 % ) ratio 45  p 32 % improvement from saba p dulera 200 prior to study with DLCO  56/57 % corrects to 72  % for alv volume    - added pred x 6 days as backup plan prn as of 03/12/2017    - 09/15/2018  After extensive coaching inhaler device,  effectiveness =    90%    Main concern is deconditioning at this point/ reconditioning reviewed  I had an extended discussion with the patient reviewing all relevant studies completed to date and  lasting 15 to 20 minutes of a 25 minute visit    See device teaching which extended face to face time for this visit.  Each maintenance medication was reviewed in detail including emphasizing most importantly the difference between maintenance and prns and under what circumstances the prns are to be triggered using an action plan format that is not reflected in the computer generated alphabetically organized AVS which I have not found useful in most complex patients, especially with respiratory illnesses  Please see AVS for specific instructions unique to this visit that I personally wrote and verbalized to the the pt in detail and then reviewed with pt  by my nurse highlighting any  changes in therapy recommended at today's visit to their plan of care.

## 2018-09-20 ENCOUNTER — Ambulatory Visit: Payer: PPO | Admitting: Podiatry

## 2018-09-20 ENCOUNTER — Encounter: Payer: Self-pay | Admitting: Podiatry

## 2018-09-20 DIAGNOSIS — M79676 Pain in unspecified toe(s): Secondary | ICD-10-CM

## 2018-09-20 DIAGNOSIS — E119 Type 2 diabetes mellitus without complications: Secondary | ICD-10-CM

## 2018-09-20 DIAGNOSIS — B351 Tinea unguium: Secondary | ICD-10-CM

## 2018-09-20 DIAGNOSIS — Q828 Other specified congenital malformations of skin: Secondary | ICD-10-CM

## 2018-09-20 DIAGNOSIS — D689 Coagulation defect, unspecified: Secondary | ICD-10-CM

## 2018-09-20 NOTE — Progress Notes (Addendum)
Patient ID: Phillip Jordan, male   DOB: 1929/11/23, 85 y.o.   MRN: [id number] Complaint:  Visit Type: Patient returns to my office for continued preventative foot care services. Complaint: Patient states" my nails have grown long and thick and become painful to walk and wear shoes" Patient has been diagnosed with DM with no foot complications. This patient has talked with his dermatologist about black streaking right hallux toenail.  The dermatologist says there is no need for concern. The patient presents for preventative foot care services. No changes to ROS.    Podiatric Exam: Vascular: dorsalis pedis and posterior tibial pulses are palpable bilateral. Capillary return is immediate. Temperature gradient is WNL. Skin turgor WNL  Sensorium: Normal Semmes Weinstein monofilament test except toes both feet.. Normal tactile sensation bilaterally. Nail Exam: Pt has thick disfigured discolored nails with subungual debris noted bilateral entire nail hallux through fifth toenails Ulcer Exam: There is no evidence of ulcer or pre-ulcerative changes or infection. Orthopedic Exam: Muscle tone and strength are WNL. No limitations in general ROM. No crepitus or effusions noted. Foot type and digits show no abnormalities. Bony prominences are unremarkable. Skin:  Symptomatic hemorrhagic  porokeratosis sub 1 right foot. . No infection or ulcers  Diagnosis:  Onychomycosis, , Pain in right toe, pain in left toe  Debride porokeratosis  Treatment & Plan Procedures and Treatment: Consent by patient was obtained for treatment procedures. The patient understood the discussion of treatment and procedures well. All questions were answered thoroughly reviewed. Debridement of mycotic and hypertrophic toenails, 1 through 5 bilateral and clearing of subungual debris. No ulceration, no infection noted. Return Visit-Office Procedure: Patient instructed to return to the office for a follow up visit 4  months for continued  evaluation and treatment.    [name] DPM

## 2018-10-12 ENCOUNTER — Telehealth: Payer: Self-pay | Admitting: Internal Medicine

## 2018-10-12 MED ORDER — MOMETASONE FURO-FORMOTEROL FUM 200-5 MCG/ACT IN AERO: 2.0000 | INHALATION_SPRAY | Freq: Two times a day (BID) | RESPIRATORY_TRACT | 0 refills | Status: DC

## 2018-10-12 NOTE — Telephone Encounter (Signed)
Samples left up front for pt.  Pt's wife Patsy (dpr on file) aware.  Nothing further needed at this time- will close encounter.

## 2018-10-18 ENCOUNTER — Other Ambulatory Visit: Payer: Self-pay | Admitting: Internal Medicine

## 2018-11-07 ENCOUNTER — Telehealth: Payer: Self-pay | Admitting: Internal Medicine

## 2018-11-07 MED ORDER — MOMETASONE FURO-FORMOTEROL FUM 200-5 MCG/ACT IN AERO: 2.0000 | INHALATION_SPRAY | Freq: Two times a day (BID) | RESPIRATORY_TRACT | 0 refills | Status: DC

## 2018-11-07 NOTE — Telephone Encounter (Signed)
1 sample of Dulera 200 up front for pick up  Nothing further needed

## 2018-11-09 ENCOUNTER — Encounter: Payer: Self-pay | Admitting: Internal Medicine

## 2018-11-21 ENCOUNTER — Telehealth: Payer: Self-pay | Admitting: Internal Medicine

## 2018-11-21 MED ORDER — MOMETASONE FURO-FORMOTEROL FUM 200-5 MCG/ACT IN AERO: 2.0000 | INHALATION_SPRAY | Freq: Two times a day (BID) | RESPIRATORY_TRACT | 0 refills | Status: DC

## 2018-11-21 NOTE — Telephone Encounter (Signed)
Ok to use dulera or symbicort as long understands they are the same rx

## 2018-11-21 NOTE — Telephone Encounter (Signed)
symbicort 160 2bid  One year's refills

## 2018-11-21 NOTE — Telephone Encounter (Signed)
Called and spoke with the patient's wife Phillip Jordan. Advised her of Dr. [name] approving sending Symbicort based on prior conversation with her husband.  Phillip Jordan stated that her and her husband disagree on this, but he has been doing well on Dulera and would like for a one month supply to be sent to the pharmacy. They are waiting on approval and was told that it should be processed by the end of the month.   Patient wife requested for prescription to be sent to [facility] on [address] as they were told it would cost about $40 a month.   Message routed to Dr. [name] to approve sending [facility].  Dr. [name], based on the above, can a refill for one month of Dulera be sent to the pharmacy?

## 2018-11-21 NOTE — Telephone Encounter (Signed)
Spoke with the pt and advised that we are not giving out samples at this time  He states needing alternative for [facility] 200 Med is too expensive  He states he thinks that he may be able to get Symbicort from the [place]  Please advise thanks

## 2018-11-21 NOTE — Telephone Encounter (Signed)
Spoke with Phillip Jordan (DPR).  Informed her the script was sent to preferred pharmacy.  Nothing further is needed.

## 2018-11-23 ENCOUNTER — Telehealth: Payer: Self-pay | Admitting: Internal Medicine

## 2018-11-23 ENCOUNTER — Other Ambulatory Visit: Payer: Self-pay | Admitting: Internal Medicine

## 2018-11-23 MED ORDER — MOMETASONE FURO-FORMOTEROL FUM 200-5 MCG/ACT IN AERO: 2.0000 | INHALATION_SPRAY | Freq: Two times a day (BID) | RESPIRATORY_TRACT | 11 refills | Status: DC

## 2018-11-23 NOTE — Telephone Encounter (Signed)
Called and spoke with pt's wife Phillip Jordan letting her know that we did not have samples of Dulera at this time and asked if she wanted Rx to be sent to pharmacy for pt and she stated yes. Stated to her that we could do a PA on med for pt. Nothing further needed.

## 2018-11-23 NOTE — Telephone Encounter (Signed)
Medication name and strength: Dulera 200 Provider: [place]: [employer] Patient insurance ID: [id number] Phone: [phone]  Fax: [phone]  Was the PA started on CMM?  yes If yes, please enter the Key: [id number] Timeframe for approval/denial: stated can check back later in [facility] for determination and fax will be sent to office  Routing to [facility] for follow up

## 2018-11-24 NOTE — Telephone Encounter (Signed)
Med approved  I faxed approval letter to [facility]

## 2018-11-30 NOTE — Telephone Encounter (Signed)
[place] at [address] to confirm they had received fax for approval. Pharmacy confirmed they received the approval, but patient hadn't picked up medication yet.  Called patient to inform of approval. Spoke with wife, Phillip Jordan, who informed me that pt filled out an assistance form with manufacturer and received a 3 mo supply, therefore, PA is not needed at this time. She thanked [place] for taking the time to initiate the PA. I told her to call us if we could help with anything.  Nothing further needed at this time.

## 2018-12-19 ENCOUNTER — Telehealth: Payer: Self-pay

## 2018-12-19 NOTE — Telephone Encounter (Signed)
Virtual Visit Pre-Appointment Phone Call  "(Name), I am calling you today to discuss your upcoming appointment. We are currently trying to limit exposure to the virus that causes COVID-19 by seeing patients at home rather than in the office."  1. "What is the BEST phone number to call the day of the visit?" - include this in appointment notes  2. "Do you have or have access to (through a family member/friend) a smartphone with video capability that we can use for your visit?" a. If yes - list this number in appt notes as "cell" (if different from BEST phone #) and list the appointment type as a VIDEO visit in appointment notes b. If no - list the appointment type as a PHONE visit in appointment notes  3. Confirm consent - "In the setting of the current Covid19 crisis, you are scheduled for a (phone or video) visit with your provider on (date) at (time).  Just as we do with many in-office visits, in order for you to participate in this visit, we must obtain consent.  If you'd like, I can send this to your mychart (if signed up) or email for you to review.  Otherwise, I can obtain your verbal consent now.  All virtual visits are billed to your insurance company just like a normal visit would be.  By agreeing to a virtual visit, we'd like you to understand that the technology does not allow for your provider to perform an examination, and thus may limit your provider's ability to fully assess your condition. If your provider identifies any concerns that need to be evaluated in person, we will make arrangements to do so.  Finally, though the technology is pretty good, we cannot assure that it will always work on either your or our end, and in the setting of a video visit, we may have to convert it to a phone-only visit.  In either situation, we cannot ensure that we have a secure connection.  Are you willing to proceed? YES  4. Advise patient to be prepared - "Two hours prior to your appointment, go  ahead and check your blood pressure, pulse, oxygen saturation, and your weight (if you have the equipment to check those) and write them all down. When your visit starts, your provider will ask you for this information. If you have an Apple Watch or Kardia device, please plan to have heart rate information ready on the day of your appointment. Please have a pen and paper handy nearby the day of the visit as well."  5. Give patient instructions for MyChart download to smartphone OR Doximity/[email or web address] as below if video visit (depending on what platform provider is using)  6. Inform patient they will receive a phone call 15 minutes prior to their appointment time (may be from unknown caller ID) so they should be prepared to answer    TELEPHONE CALL NOTE  Phillip Jordan has been deemed a candidate for a follow-up tele-health visit to limit community exposure during the Covid-19 pandemic. I spoke with the patient via phone to ensure availability of phone/video source, confirm preferred email & phone number, and discuss instructions and expectations.  I reminded Phillip Jordan to be prepared with any vital sign and/or heart rhythm information that could potentially be obtained via home monitoring, at the time of his visit. I reminded Phillip Jordan to expect a phone call prior to his visit.  Michaelyn Barter, RN 12/19/2018 2:30 PM   IF  USING DOXIMITY or DOXY.ME - The patient will receive a link just prior to their visit by text.     FULL LENGTH CONSENT FOR TELE-HEALTH VISIT   I hereby voluntarily request, consent and authorize [facility] and its employed or contracted physicians, physician assistants, nurse practitioners or other licensed health care professionals (the Practitioner), to provide me with telemedicine health care services (the "Services") as deemed necessary by the treating Practitioner. I acknowledge and consent to receive the Services by the Practitioner via telemedicine.  I understand that the telemedicine visit will involve communicating with the Practitioner through live audiovisual communication technology and the disclosure of certain medical information by electronic transmission. I acknowledge that I have been given the opportunity to request an in-person assessment or other available alternative prior to the telemedicine visit and am voluntarily participating in the telemedicine visit.  I understand that I have the right to withhold or withdraw my consent to the use of telemedicine in the course of my care at any time, without affecting my right to future care or treatment, and that the Practitioner or I may terminate the telemedicine visit at any time. I understand that I have the right to inspect all information obtained and/or recorded in the course of the telemedicine visit and may receive copies of available information for a reasonable fee.  I understand that some of the potential risks of receiving the Services via telemedicine include:  [name] Delay or interruption in medical evaluation due to technological equipment failure or disruption; . Information transmitted may not be sufficient (e.g. poor resolution of images) to allow for appropriate medical decision making by the Practitioner; and/or  . In rare instances, security protocols could fail, causing a breach of personal health information.  Furthermore, I acknowledge that it is my responsibility to provide information about my medical history, conditions and care that is complete and accurate to the best of my ability. I acknowledge that Practitioner's advice, recommendations, and/or decision may be based on factors not within their control, such as incomplete or inaccurate data provided by me or distortions of diagnostic images or specimens that may result from electronic transmissions. I understand that the practice of medicine is not an exact science and that Practitioner makes no warranties or guarantees  regarding treatment outcomes. I acknowledge that I will receive a copy of this consent concurrently upon execution via email to the email address I last provided but may also request a printed copy by calling the office of [facility].    I understand that my insurance will be billed for this visit.   I have read or had this consent read to me. . I understand the contents of this consent, which adequately explains the benefits and risks of the Services being provided via telemedicine.  . I have been provided ample opportunity to ask questions regarding this consent and the Services and have had my questions answered to my satisfaction. . I give my informed consent for the services to be provided through the use of telemedicine in my medical care  By participating in this telemedicine visit I agree to the above.

## 2018-12-26 ENCOUNTER — Ambulatory Visit: Payer: PPO | Admitting: Cardiovascular Disease

## 2018-12-28 NOTE — Progress Notes (Signed)
Virtual Visit via Video Note   This visit type was conducted due to national recommendations for restrictions regarding the COVID-19 Pandemic (e.g. social distancing) in an effort to limit this patient's exposure and mitigate transmission in our community.  Due to his co-morbid illnesses, this patient is at least at moderate risk for complications without adequate follow up.  This format is felt to be most appropriate for this patient at this time.  All issues noted in this document were discussed and addressed.  A limited physical exam was performed with this format.  Please refer to the patient's chart for his consent to telehealth for [facility]   Date:  12/30/2018   ID:  Phillip Jordan, DOB [birth year], MRN [id number]  Patient Location: Home Provider Location: Office  PCP:  Colon Branch, MD  Cardiologist:  [name] Electrophysiologist:  None   Evaluation Performed:  Follow-Up Visit  Chief Complaint:  CAD  History of Present Illness:    85 y.o. . with distant history of CAD with DES to proximal RCA and mid LAD in October 2014   Significant COPD Gold 3 followed by pulmonary Dr [name] Stopped smoking in 1994  He thinks he is doing well with baseline dyspnea and no chest pain  Complains of legs hurting rest/exercise but had normal ABI's done October 2018 ? neuropathy   Pollen has gotten COPD a bit worse lately   Has not had recent stress testing but active with no angina   The patient does not have symptoms concerning for COVID-19 infection (fever, chills, cough, or new shortness of breath).    Past Medical History:  Diagnosis Date  . Allergic rhinitis   . Aortic dissection ([place])    f/u by cards - AAA with distal dissection.  [name] CAD (coronary artery disease)    a. s/p MI 1992. b. Prior hx stenting to RCA/Cx. b. 05/2013: [place] s/p DES to distal RCA then staged DES to prox LAD.   [name] COPD with asthma ([place])   . Diabetes mellitus   . Hyperlipidemia   . Lichen planus    Right midline inferior chest, Dr. [name] Sensing  . Shingles    Past Surgical History:  Procedure Laterality Date  . BACK SURGERY  1993  . CARDIAC CATHETERIZATION     left, w/ coronary angiography and left ventriculograpy  . LEFT HEART CATHETERIZATION WITH CORONARY ANGIOGRAM N/A 06/09/2013   Procedure: LEFT HEART CATHETERIZATION WITH CORONARY ANGIOGRAM;  Surgeon: [name], MD;  [facility] LAB;  Service: Cardiovascular;  Laterality: N/A;  . PERCUTANEOUS CORONARY STENT INTERVENTION (PCI-S) N/A 06/12/2013   Procedure: PERCUTANEOUS CORONARY STENT INTERVENTION (PCI-S);  Surgeon: [name], MD;  Location: [facility] CATH LAB;  Service: Cardiovascular;  Laterality: N/A;  prox LAD  . PTCA     w/ placement of drugeluting stent in the distal right coronary artery, and distal Cardiologist: [name]     Current Meds  Medication Sig  . albuterol (PROAIR HFA) 108 (90 Base) MCG/ACT inhaler Inhale 1-2 puffs into the lungs every 4 (four) hours as needed for wheezing or shortness of breath.  [name] amLODipine (NORVASC) 2.5 MG tablet Take 1 tablet (2.5 mg total) by mouth daily.  [name] aspirin 81 MG tablet Take 1 tablet (81 mg total) by mouth daily.  . clopidogrel (PLAVIX) 75 MG tablet Take 1 tablet (75 mg total) by mouth daily. PLAVIX - Should not be held prior to cath procedure unless specifically ordered  . famotidine (PEPCID) 20  MG tablet TAKE 1 TABLET BY MOUTH AT BEDTIME  . glucose blood (FREESTYLE TEST STRIPS) test strip Check blood sugar no more than twice daily  . isosorbide mononitrate (IMDUR) 30 MG 24 hr tablet Take 1 tablet (30 mg total) by mouth 2 (two) times daily.  . meclizine (ANTIVERT) 12.5 MG tablet Take 1 tablet (12.5 mg total) 3 (three) times daily as needed by mouth for dizziness.  . metFORMIN (GLUCOPHAGE) 500 MG tablet Take 1 tablet (500 mg total) by mouth daily with breakfast. Give w/food.  . Misc. Devices (ACAPELLA) MISC Use as directed  . mometasone-formoterol (DULERA)  200-5 MCG/ACT AERO Inhale 2 puffs into the lungs 2 (two) times daily.  . Multiple Vitamin (MULTIVITAMIN WITH MINERALS) TABS tablet Take 1 tablet by mouth daily.  . nitroGLYCERIN (NITROSTAT) 0.4 MG SL tablet Place 1 tablet (0.4 mg total) under the tongue every 5 (five) minutes as needed for chest pain (up to 3 doses only.).  [name] pravastatin (PRAVACHOL) 40 MG tablet Take 1 tablet (40 mg total) by mouth daily.  [name] telmisartan (MICARDIS) 20 MG tablet Take 1 tablet (20 mg total) by mouth daily.     Allergies:   Patient has no known allergies.   Social History   Tobacco Use  . Smoking status: Former Smoker    Packs/day: 1.50    Years: 39.00    Pack years: 58.50    Types: Cigarettes    Last attempt to quit: 08/24/1992    Years since quitting: 26.3  . Smokeless tobacco: Never Used  Substance Use Topics  . Alcohol use: No  . Drug use: No     Family Hx: The patient's family history includes Asthma in his mother; Cancer in his mother; Heart disease in his father and mother. There is no history of Prostate cancer or Colon cancer.  ROS:   Please see the history of present illness.     All other systems reviewed and are negative.   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 06/28/17  Recent Labs: 09/06/2018: ALT 16; BUN 16; Creatinine, Ser 0.98; Hemoglobin 14.5; Platelets 299.0; Potassium 5.0; Sodium 136   Recent Lipid Panel Lab Results  Component Value Date/Time   CHOL 157 09/06/2018 08:43 AM   TRIG 126.0 09/06/2018 08:43 AM   TRIG 181 09/06/2009 04:02 PM   HDL 38.60 (L) 09/06/2018 08:43 AM   CHOLHDL 4 09/06/2018 08:43 AM   LDLCALC 93 09/06/2018 08:43 AM    Wt Readings from Last 3 Encounters:  12/30/18 78 kg  09/15/18 81 kg  09/01/18 79.9 kg     Objective:    Vital Signs:  BP 125/66   Pulse 78   Ht 5\' 9"  (1.753 m)   Wt 78 kg   BMI 25.40 kg/m     Elderly white male No distress No tachypnea No audible wheezing No JVP elevation No edema Skin warm and dry   ASSESSMENT & PLAN:    CAD: .Stable with no angina and good activity level.  DES to RCA in 2014 Continue medical Rx New nitro called in  HTN: Well controlled.  Continue current medications and low sodium Dash type diet.    AAA: upper normal 2.5 cm  No abdominal pain observe given age   DM:: Discussed low carb diet.  Target hemoglobin A1c is 6.5 or less.  Continue current medications.  Chol:  Cholesterol is at goal.  Continue current dose of statin and diet Rx.  No myalgias or side effects.  F/U  LFT's in 6 months. Recent Labs       Lab Results  Component Value Date   LDLCALC 88 11/09/2017     COPD: no active wheezing continue Dulera f/u pulmonary Dr [name]   Leg Pain: not vascular normal ABI's done 07/07/17    COVID-19 Education: The signs and symptoms of COVID-19 were discussed with the patient and how to seek care for testing (follow up with PCP or arrange E-visit).  The importance of social distancing was discussed today.  Time:   Today, I have spent 30 minutes with the patient with telehealth technology discussing the above problems.     Medication Adjustments/Labs and Tests Ordered: Current medicines are reviewed at length with the patient today.  Concerns regarding medicines are outlined above.   Tests Ordered: No orders of the defined types were placed in this encounter.   Medication Changes: No orders of the defined types were placed in this encounter.   Disposition:  Follow up in a year   Signed, Jenkins Rouge, MD  12/30/2018 9:55 AM    [place]

## 2018-12-30 ENCOUNTER — Other Ambulatory Visit: Payer: Self-pay

## 2018-12-30 ENCOUNTER — Telehealth (INDEPENDENT_AMBULATORY_CARE_PROVIDER_SITE_OTHER): Payer: PPO | Admitting: Cardiovascular Disease

## 2018-12-30 VITALS — BP 125/66 | HR 78 | Ht 69.0 in | Wt 172.0 lb

## 2018-12-30 DIAGNOSIS — I2510 Atherosclerotic heart disease of native coronary artery without angina pectoris: Secondary | ICD-10-CM

## 2018-12-30 MED ORDER — NITROGLYCERIN 0.4 MG SL SUBL: 0.4000 mg | SUBLINGUAL_TABLET | SUBLINGUAL | 3 refills | Status: AC | PRN

## 2018-12-30 NOTE — Patient Instructions (Addendum)

## 2019-01-13 ENCOUNTER — Other Ambulatory Visit: Payer: Self-pay | Admitting: Internal Medicine

## 2019-01-17 ENCOUNTER — Other Ambulatory Visit: Payer: Self-pay

## 2019-01-17 ENCOUNTER — Ambulatory Visit: Payer: PPO | Admitting: Podiatry

## 2019-01-17 ENCOUNTER — Encounter: Payer: Self-pay | Admitting: Podiatry

## 2019-01-17 VITALS — Temp 97.2°F

## 2019-01-17 DIAGNOSIS — B351 Tinea unguium: Secondary | ICD-10-CM

## 2019-01-17 DIAGNOSIS — E119 Type 2 diabetes mellitus without complications: Secondary | ICD-10-CM | POA: Diagnosis not present

## 2019-01-17 DIAGNOSIS — M79676 Pain in unspecified toe(s): Secondary | ICD-10-CM

## 2019-01-17 DIAGNOSIS — Q828 Other specified congenital malformations of skin: Secondary | ICD-10-CM

## 2019-01-17 DIAGNOSIS — D689 Coagulation defect, unspecified: Secondary | ICD-10-CM | POA: Diagnosis not present

## 2019-01-17 NOTE — Progress Notes (Signed)
Patient ID: Phillip Jordan, male   DOB: 12-Feb-1930, 85 y.o.   MRN: [id number] Complaint:  Visit Type: Patient returns to my office for continued preventative foot care services. Complaint: Patient states" my nails have grown long and thick and become painful to walk and wear shoes" Patient has been diagnosed with DM with no foot complications. The patient presents for preventative foot care services. No changes to ROS.  He has been under treatment for COPD.   Podiatric Exam: Vascular: dorsalis pedis and posterior tibial pulses are palpable bilateral. Capillary return is immediate. Temperature gradient is WNL. Skin turgor WNL  Sensorium: Normal Semmes Weinstein monofilament test except toes both feet.. Normal tactile sensation bilaterally. Nail Exam: Pt has thick disfigured discolored nails with subungual debris noted bilateral entire nail hallux through fifth toenails Ulcer Exam: There is no evidence of ulcer or pre-ulcerative changes or infection. Orthopedic Exam: Muscle tone and strength are WNL. No limitations in general ROM. No crepitus or effusions noted. Foot type and digits show no abnormalities. Bony prominences are unremarkable. Skin:  Symptomatic porokeratosis sub 1 right foot. porokeratosis noted. No infection or ulcers  Diagnosis:  Onychomycosis, , Pain in right toe, pain in left toe  Debride porokeratosis  Treatment & Plan Procedures and Treatment: Consent by patient was obtained for treatment procedures. The patient understood the discussion of treatment and procedures well. All questions were answered thoroughly reviewed. Debridement of mycotic and hypertrophic toenails, 1 through 5 bilateral and clearing of subungual debris. No ulceration, no infection noted. Return Visit-Office Procedure: Patient instructed to return to the office for a follow up visit 4 months for continued evaluation and treatment.    [name] DPM

## 2019-01-29 ENCOUNTER — Other Ambulatory Visit: Payer: Self-pay | Admitting: Internal Medicine

## 2019-01-30 ENCOUNTER — Other Ambulatory Visit: Payer: Self-pay | Admitting: Internal Medicine

## 2019-01-30 DIAGNOSIS — J449 Chronic obstructive pulmonary disease, unspecified: Secondary | ICD-10-CM

## 2019-02-06 ENCOUNTER — Other Ambulatory Visit: Payer: Self-pay | Admitting: Internal Medicine

## 2019-02-07 ENCOUNTER — Ambulatory Visit (INDEPENDENT_AMBULATORY_CARE_PROVIDER_SITE_OTHER): Payer: PPO | Admitting: Internal Medicine

## 2019-02-07 ENCOUNTER — Other Ambulatory Visit: Payer: Self-pay

## 2019-02-07 ENCOUNTER — Encounter: Payer: Self-pay | Admitting: Internal Medicine

## 2019-02-07 VITALS — BP 160/80 | HR 72 | Temp 97.5°F | Resp 16 | Ht 69.0 in | Wt 178.0 lb

## 2019-02-07 DIAGNOSIS — I10 Essential (primary) hypertension: Secondary | ICD-10-CM

## 2019-02-07 DIAGNOSIS — E11319 Type 2 diabetes mellitus with unspecified diabetic retinopathy without macular edema: Secondary | ICD-10-CM

## 2019-02-07 DIAGNOSIS — I2510 Atherosclerotic heart disease of native coronary artery without angina pectoris: Secondary | ICD-10-CM

## 2019-02-07 NOTE — Patient Instructions (Addendum)
Get the blood work     [place] Schedule your next appointment       Check the  blood pressure   Weekly  GOAL is between 110/65 and  135/85. If it is consistently higher or lower, let me know   Flu shot early this year, high dose

## 2019-02-07 NOTE — Assessment & Plan Note (Signed)
DM, currently on metformin,  Check A1c HTN: BP today 160/80, at home typically very good, yesterday was 123/76.  For now continue amlodipine, Micardis.  Check ambulatory BPs, see AVS. check a BMP CAD: Seen recently by cardiology, stable. AAA: Cardiology recommended observation given patient's age. Preventive care: Encouraged to have his yearly flu shot earlier this season, high-dose indicated. RTC 6 months CPX

## 2019-02-07 NOTE — Progress Notes (Signed)
Subjective:    Patient ID: Phillip Jordan, male    DOB: [birth year], 85 y.o.   MRN: [id number]  DOS:  02/07/2019 Type of visit - description: ROV CAD: Recently saw cardiology, note reviewed HTN: Good compliance with medication, ambulatory BPs normal COVID-19: Taking good precautions. DM, no recent ambulatory CBGs, but the last time he checked it was 96  BP Readings from Last 3 Encounters:  02/07/19 (!) 160/80  12/30/18 125/66  09/15/18 130/72     Review of Systems  Denies fever chills Shortness of breath and mild cough at baseline, he has COPD Occasional clear sputum. No nausea, vomiting, diarrhea  Past Medical History:  Diagnosis Date  . Allergic rhinitis   . Aortic dissection ([place])    f/u by cards - AAA with distal dissection.  [name] CAD (coronary artery disease)    a. s/p MI 1992. b. Prior hx stenting to RCA/Cx. b. 05/2013: [place] s/p DES to distal RCA then staged DES to prox LAD.   [name] COPD with asthma ([place])   . Diabetes mellitus   . Hyperlipidemia   . Lichen planus    Right midline inferior chest, Dr. [name] Sensing  . Shingles     Past Surgical History:  Procedure Laterality Date  . BACK SURGERY  1993  . CARDIAC CATHETERIZATION     left, w/ coronary angiography and left ventriculograpy  . LEFT HEART CATHETERIZATION WITH CORONARY ANGIOGRAM N/A 06/09/2013   Procedure: LEFT HEART CATHETERIZATION WITH CORONARY ANGIOGRAM;  Surgeon: [name], MD;  [facility] CATH LAB;  Service: Cardiovascular;  Laterality: N/A;  . PERCUTANEOUS CORONARY STENT INTERVENTION (PCI-S) N/A 06/12/2013   Procedure: PERCUTANEOUS CORONARY STENT INTERVENTION (PCI-S);  Surgeon: [name], MD;  Location[facility] LAB;  Service: Cardiovascular;  Laterality: N/A;  prox LAD  . PTCA     w/ placement of drugeluting stent in the distal right coronary artery, and distal Cardiologist: [name]    Social History   Socioeconomic History  . Marital status: Married    Spouse name:  Not on file  . Number of children: 3  . Years of education: Not on file  . Highest education level: Not on file  Occupational History  . Occupation: retired  Scientific laboratory technician  . Financial resource strain: Not on file  . Food insecurity    Worry: Not on file    Inability: Not on file  . Transportation needs    Medical: Not on file    Non-medical: Not on file  Tobacco Use  . Smoking status: Former Smoker    Packs/day: 1.50    Years: 39.00    Pack years: 58.50    Types: Cigarettes    Quit date: 08/24/1992    Years since quitting: 26.4  . Smokeless tobacco: Never Used  Substance and Sexual Activity  . Alcohol use: No  . Drug use: No  . Sexual activity: Not on file  Lifestyle  . Physical activity    Days per week: Not on file    Minutes per session: Not on file  . Stress: Not on file  Relationships  . Social Herbalist on phone: Not on file    Gets together: Not on file    Attends religious service: Not on file    Active member of club or organization: Not on file    Attends meetings of clubs or organizations: Not on file    Relationship status: Not on file  . Intimate  partner violence    Fear of current or ex partner: Not on file    Emotionally abused: Not on file    Physically abused: Not on file    Forced sexual activity: Not on file  Other Topics Concern  . Not on file  Social History Narrative   Lives w/ wife    3 kids, 58 [id number]      Allergies as of 02/07/2019   No Known Allergies     Medication List       Accurate as of February 07, 2019  8:01 PM. If you have any questions, ask your nurse or doctor.        Acapella Misc Use as directed   albuterol 108 (90 Base) MCG/ACT inhaler Commonly known as: VENTOLIN HFA Inhale 2 puffs into the lungs every 4 (four) hours as needed for wheezing or shortness of breath. What changed: See the new instructions. Changed by: Kathlene November, MD   amLODipine 2.5 MG tablet Commonly known as: NORVASC Take 1 tablet (2.5  mg total) by mouth daily.   aspirin 81 MG tablet Take 1 tablet (81 mg total) by mouth daily.   clopidogrel 75 MG tablet Commonly known as: PLAVIX Take 1 tablet (75 mg total) by mouth daily. PLAVIX - Should not be held prior to cath procedure unless specifically ordered   famotidine 20 MG tablet Commonly known as: PEPCID TAKE 1 TABLET BY MOUTH AT BEDTIME   glucose blood test strip Commonly known as: FREESTYLE TEST STRIPS Check blood sugar no more than twice daily   isosorbide mononitrate 30 MG 24 hr tablet Commonly known as: IMDUR Take 1 tablet (30 mg total) by mouth 2 (two) times daily.   meclizine 12.5 MG tablet Commonly known as: ANTIVERT Take 1 tablet (12.5 mg total) 3 (three) times daily as needed by mouth for dizziness.   metFORMIN 500 MG tablet Commonly known as: GLUCOPHAGE Take 1 tablet (500 mg total) by mouth daily with breakfast. Give w/food.   mometasone-formoterol 200-5 MCG/ACT Aero Commonly known as: DULERA Inhale 2 puffs into the lungs 2 (two) times daily.   multivitamin with minerals Tabs tablet Take 1 tablet by mouth daily.   nitroGLYCERIN 0.4 MG SL tablet Commonly known as: NITROSTAT Place 1 tablet (0.4 mg total) under the tongue every 5 (five) minutes as needed for chest pain (up to 3 doses only.).   pravastatin 40 MG tablet Commonly known as: PRAVACHOL Take 1 tablet (40 mg total) by mouth daily.   telmisartan 20 MG tablet Commonly known as: MICARDIS Take 1 tablet (20 mg total) by mouth daily.           Objective:   Physical Exam BP (!) 160/80 (BP Location: Left Arm, Patient Position: Sitting, Cuff Size: Normal)   Pulse 72   Temp (!) 97.5 F (36.4 C) (Oral)   Resp 16   Ht 5\' 9"  (1.753 m)   Wt 178 lb (80.7 kg)   SpO2 96%   BMI 26.29 kg/m  General:   Well developed, NAD, BMI noted.  HEENT:  Normocephalic . Face symmetric, atraumatic Lungs:  Decreased breath sounds, few end expiratory wheezes.  Otherwise clear Normal respiratory  effort, no intercostal retractions, no accessory muscle use. Heart: RRR,  no murmur.  no pretibial edema bilaterally  Abdomen:  Not distended, soft, non-tender. No rebound or rigidity.  No mass or bruit Skin: Not pale. Not jaundice Neurologic:  alert & oriented X3.  Speech normal, gait appropriate for age and unassisted  Psych--  Cognition and judgment appear intact.  Cooperative with normal attention span and concentration.  Behavior appropriate. No anxious or depressed appearing.     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 Ronnald Ramp Chronic hoarseness, status post ENT eval ~ 2012 Normal DEXA 2012  PLAN: DM, currently on metformin,  Check A1c HTN: BP today 160/80, at home typically very good, yesterday was 123/76.  For now continue amlodipine, Micardis.  Check ambulatory BPs, see AVS. check a BMP CAD: Seen recently by cardiology, stable. AAA: Cardiology recommended observation given patient's age. Preventive care: Encouraged to have his yearly flu shot earlier this season, high-dose indicated. RTC 6 months CPX

## 2019-02-07 NOTE — Progress Notes (Signed)
Pre visit review using our clinic review tool, if applicable. No additional management support is needed unless otherwise documented below in the visit note. 

## 2019-02-08 LAB — HEMOGLOBIN A1C: Hgb A1c MFr Bld: 6.9 % — ABNORMAL HIGH (ref 4.6–6.5)

## 2019-02-08 LAB — BASIC METABOLIC PANEL
BUN: 16 mg/dL (ref 6–23)
CO2: 25 mEq/L (ref 19–32)
Calcium: 9.7 mg/dL (ref 8.4–10.5)
Chloride: 99 mEq/L (ref 96–112)
Creatinine, Ser: 0.99 mg/dL (ref 0.40–1.50)
GFR: 71.55 mL/min (ref >60.00)
Glucose, Bld: 75 mg/dL (ref 70–99)
Potassium: 5.2 mEq/L — ABNORMAL HIGH (ref 3.5–5.1)
Sodium: 135 mEq/L (ref 135–145)

## 2019-02-14 ENCOUNTER — Encounter: Payer: Self-pay | Admitting: Internal Medicine

## 2019-03-02 NOTE — Progress Notes (Addendum)
Virtual Visit via Video Note  I connected with patient on 03/03/19 at 11:00 AM EDT by audio enabled telemedicine application and verified that I am speaking with the correct person using two identifiers.   THIS ENCOUNTER IS A VIRTUAL VISIT DUE TO COVID-19 - PATIENT WAS NOT SEEN IN THE OFFICE. PATIENT HAS CONSENTED TO VIRTUAL VISIT / TELEMEDICINE VISIT   Location of patient: home  Location of provider: office  I discussed the limitations of evaluation and management by telemedicine and the availability of in person appointments. The patient expressed understanding and agreed to proceed.   Subjective:   Phillip Jordan is a 85 y.o. male who presents for Medicare Annual/Subsequent preventive examination.  Review of Systems: No ROS.  Medicare Wellness Virtual Visit.  Visual/audio telehealth visit, UTA vital signs.   See social history for additional risk factors. Cardiac Risk Factors include: advanced age (>90men, >75 women);diabetes mellitus;dyslipidemia;hypertension;male gender Sleep patterns: no issues. Home Safety/Smoke Alarms: Feels safe in home. Smoke alarms in place.  Lives in 1 story home with wife. Walk in shower.   Male:      PSA-  Lab Results  Component Value Date   PSA 0.73 04/24/2010   PSA 0.79 04/23/2009   PSA 0.89 06/30/2007   Eye-Dr.Lyles yearly. UTD per pt.    Objective:    Vitals: BP 132/70 Comment: pt reported vitals  Pulse 77   There is no height or weight on file to calculate BMI.  Advanced Directives 03/03/2019 03/01/2018 03/08/2017 03/04/2016 01/03/2016 08/28/2015 06/11/2013  Does Patient Have a Medical Advance Directive? Yes Yes Yes Yes Yes No Patient has advance directive, copy not in chart  Type of Advance Directive Living will Living will Living will Living will Living will;Healthcare Power of Attorney - Living will;Healthcare Power of Attorney  Does patient want to make changes to medical advance directive? No - Patient declined - - - No - Patient declined  - -  Copy of [place] in Chart? - - - Yes No - copy requested - -  Would patient like information on creating a medical advance directive? - - - - - No - patient declined information -  Pre-existing out of facility DNR order (yellow form or pink MOST form) - - - - - - No    Tobacco Social History   Tobacco Use  Smoking Status Former Smoker  . Packs/day: 1.50  . Years: 39.00  . Pack years: 58.50  . Types: Cigarettes  . Quit date: 08/24/1992  . Years since quitting: 26.5  Smokeless Tobacco Never Used     Counseling given: Not Answered   Clinical Intake:  Pain : No/denies pain    Past Medical History:  Diagnosis Date  . Allergic rhinitis   . Aortic dissection ([place])    f/u by cards - AAA with distal dissection.  [name] CAD (coronary artery disease)    a. s/p MI 1992. b. Prior hx stenting to RCA/Cx. b. 05/2013: [place] s/p DES to distal RCA then staged DES to prox LAD.   [name] COPD with asthma ([place])   . Diabetes mellitus   . Hyperlipidemia   . Lichen planus    Right midline inferior chest, Dr. [name] Sensing  . Shingles    Past Surgical History:  Procedure Laterality Date  . BACK SURGERY  1993  . CARDIAC CATHETERIZATION     left, w/ coronary angiography and left ventriculograpy  . LEFT HEART CATHETERIZATION WITH CORONARY ANGIOGRAM N/A 06/09/2013   Procedure: LEFT  HEART CATHETERIZATION WITH CORONARY ANGIOGRAM;  Surgeon: [name], MD;  Location: [facility] CATH LAB;  Service: Cardiovascular;  Laterality: N/A;  . PERCUTANEOUS CORONARY STENT INTERVENTION (PCI-S) N/A 06/12/2013   Procedure: PERCUTANEOUS CORONARY STENT INTERVENTION (PCI-S);  Surgeon: [name], MD;  Location: [facility] CATH LAB;  Service: Cardiovascular;  Laterality: N/A;  prox LAD  . PTCA     w/ placement of drugeluting stent in the distal right coronary artery, and distal Cardiologist: [name]   Family History  Problem Relation Age of Onset  . Asthma Mother   . Heart disease Mother         CHF  . Cancer Mother        melanoma  . Heart disease Father        MI  . Prostate cancer Neg Hx   . Colon cancer Neg Hx    Social History   Socioeconomic History  . Marital status: Married    Spouse name: Not on file  . Number of children: 3  . Years of education: Not on file  . Highest education level: Not on file  Occupational History  . Occupation: retired  Scientific laboratory technician  . Financial resource strain: Not on file  . Food insecurity    Worry: Not on file    Inability: Not on file  . Transportation needs    Medical: Not on file    Non-medical: Not on file  Tobacco Use  . Smoking status: Former Smoker    Packs/day: 1.50    Years: 39.00    Pack years: 58.50    Types: Cigarettes    Quit date: 08/24/1992    Years since quitting: 26.5  . Smokeless tobacco: Never Used  Substance and Sexual Activity  . Alcohol use: No  . Drug use: No  . Sexual activity: Not on file  Lifestyle  . Physical activity    Days per week: Not on file    Minutes per session: Not on file  . Stress: Not on file  Relationships  . Social Herbalist on phone: Not on file    Gets together: Not on file    Attends religious service: Not on file    Active member of club or organization: Not on file    Attends meetings of clubs or organizations: Not on file    Relationship status: Not on file  Other Topics Concern  . Not on file  Social History Narrative   Lives w/ wife    3 kids, 35 [id number]    Outpatient Encounter Medications as of 03/03/2019  Medication Sig  . albuterol (VENTOLIN HFA) 108 (90 Base) MCG/ACT inhaler Inhale 2 puffs into the lungs every 4 (four) hours as needed for wheezing or shortness of breath.  [name] amLODipine (NORVASC) 2.5 MG tablet Take 1 tablet (2.5 mg total) by mouth daily.  [name] aspirin 81 MG tablet Take 1 tablet (81 mg total) by mouth daily.  . clopidogrel (PLAVIX) 75 MG tablet Take 1 tablet (75 mg total) by mouth daily. PLAVIX - Should not be held prior to cath  procedure unless specifically ordered  . famotidine (PEPCID) 20 MG tablet TAKE 1 TABLET BY MOUTH AT BEDTIME  . isosorbide mononitrate (IMDUR) 30 MG 24 hr tablet Take 1 tablet (30 mg total) by mouth 2 (two) times daily.  . metFORMIN (GLUCOPHAGE) 500 MG tablet Take 1 tablet (500 mg total) by mouth daily with breakfast. Give w/food.  . Misc. Devices ([place]) [place]  Use as directed  . mometasone-formoterol (DULERA) 200-5 MCG/ACT AERO Inhale 2 puffs into the lungs 2 (two) times daily.  . Multiple Vitamin (MULTIVITAMIN WITH MINERALS) TABS tablet Take 1 tablet by mouth daily.  . pravastatin (PRAVACHOL) 40 MG tablet Take 1 tablet (40 mg total) by mouth daily.  [name] telmisartan (MICARDIS) 20 MG tablet Take 1 tablet (20 mg total) by mouth daily.  [name] glucose blood (FREESTYLE TEST STRIPS) test strip Check blood sugar no more than twice daily (Patient not taking: Reported on 03/03/2019)  . meclizine (ANTIVERT) 12.5 MG tablet Take 1 tablet (12.5 mg total) 3 (three) times daily as needed by mouth for dizziness. (Patient not taking: Reported on 03/03/2019)  . nitroGLYCERIN (NITROSTAT) 0.4 MG SL tablet Place 1 tablet (0.4 mg total) under the tongue every 5 (five) minutes as needed for chest pain (up to 3 doses only.). (Patient not taking: Reported on 03/03/2019)   No facility-administered encounter medications on file as of 03/03/2019.     Activities of Daily Living In your present state of health, do you have any difficulty performing the following activities: 03/03/2019  Hearing? Y  Comment wears hearing aids.  Vision? N  Difficulty concentrating or making decisions? N  Walking or climbing stairs? N  Dressing or bathing? N  Doing errands, shopping? N  Preparing Food and eating ? N  Using the Toilet? N  In the past six months, have you accidently leaked urine? N  Do you have problems with loss of bowel control? N  Managing your Medications? N  Managing your Finances? N  Housekeeping or managing your  Housekeeping? N  Some recent data might be hidden    Patient Care Team: Colon Branch, MD as PCP - General [name] Sensing, MD as Consulting Physician (Dermatology) [name], MD as Consulting Physician (Ophthalmology) [name], MD as Consulting Physician (Pulmonary Disease) [name], MD as Consulting Physician (Cardiology) [name], DPM as Consulting Physician (Podiatry)   Assessment:   This is a routine wellness examination for Phillip Jordan. Physical assessment deferred to PCP.  Exercise Activities and Dietary recommendations Current Exercise Habits: The patient does not participate in regular exercise at present, Exercise limited by: None identified   Diet (meal preparation, eat out, water intake, caffeinated beverages, dairy products, fruits and vegetables): well balanced, on average, 3 meals per day     Drinks 30 oz of water per day.  Goals    . Maintain healthy active lifestyle    . Patient Stated     No declines in health. Continue to serve God and country.       Fall Risk Fall Risk  03/03/2019 03/01/2018 03/08/2017 03/04/2016 01/03/2016  Falls in the past year? 0 No No No No  Risk for fall due to : - - - History of fall(s) -    Depression Screen PHQ 2/9 Scores 03/03/2019 03/01/2018 03/08/2017 03/19/2016  PHQ - 2 Score 0 0 0 0  Exception Documentation - - - -    Cognitive Function Ad8 score reviewed for issues:  Issues making decisions:no  Less interest in hobbies / activities:no  Repeats questions, stories (family complaining):no  Trouble using ordinary gadgets (microwave, computer, phone):no  Forgets the month or year: no  Mismanaging finances: no  Remembering appts:no  Daily problems with thinking and/or memory:no Ad8 score is=0     MMSE - Mini Mental State Exam 03/01/2018 03/08/2017 03/04/2016  Not completed: Refused - -  Orientation to time - 5 5  Orientation  to Place - 5 5  Registration - 3 3  Attention/ Calculation - 2 5  Recall - 2 3   Language- name 2 objects - 2 2  Language- repeat - 1 1  Language- follow 3 step command - 3 3  Language- read & follow direction - 1 1  Write a sentence - 1 1  Copy design - 1 1  Total score - 26 30        Immunization History  Administered Date(s) Administered  . Influenza Split 05/26/2011, 06/01/2012  . Influenza Whole 06/30/2007, 06/18/2008, 06/14/2009, 04/24/2010  . Influenza, High Dose Seasonal PF 06/19/2014, 05/02/2015, 06/08/2017, 05/16/2018  . Influenza,inj,Quad PF,6+ Mos 06/10/2013  . Influenza-Unspecified 05/21/2016  . Pneumococcal Conjugate-13 09/10/2015  . Pneumococcal Polysaccharide-23 08/24/2001, 06/30/2007, 06/01/2012  . Td 08/24/2005, 07/06/2016  . Zoster 10/13/2007  . Zoster Recombinat (Shingrix) 09/01/2018, 11/01/2018   Screening Tests Health Maintenance  Topic Date Due  . INFLUENZA VACCINE  03/25/2019  . OPHTHALMOLOGY EXAM  06/11/2019  . HEMOGLOBIN A1C  08/09/2019  . FOOT EXAM  09/02/2019  . TETANUS/TDAP  07/06/2026  . PNA vac Low Risk Adult  Completed       Plan:    Please schedule your next medicare wellness visit with me in 1 yr.  Continue to eat heart healthy diet (full of fruits, vegetables, whole grains, lean protein, water--limit salt, fat, and sugar intake) and increase physical activity as tolerated.  Continue doing brain stimulating activities (puzzles, reading, adult coloring books, staying active) to keep memory sharp.   I have personally reviewed and noted the following in the patient's chart:   . Medical and social history . Use of alcohol, tobacco or illicit drugs  . Current medications and supplements . Functional ability and status . Nutritional status . Physical activity . Advanced directives . List of other physicians . Hospitalizations, surgeries, and ER visits in previous 12 months . Vitals . Screenings to include cognitive, depression, and falls . Referrals and appointments  In addition, I have reviewed and discussed  with patient certain preventive protocols, quality metrics, and best practice recommendations. A written personalized care plan for preventive services as well as general preventive health recommendations were provided to patient.     Naaman Plummer [place], [place]  03/03/2019  Kathlene November, MD

## 2019-03-03 ENCOUNTER — Ambulatory Visit (INDEPENDENT_AMBULATORY_CARE_PROVIDER_SITE_OTHER): Payer: PPO | Admitting: *Deleted

## 2019-03-03 ENCOUNTER — Other Ambulatory Visit: Payer: Self-pay

## 2019-03-03 ENCOUNTER — Encounter: Payer: Self-pay | Admitting: *Deleted

## 2019-03-03 VITALS — BP 132/70 | HR 77

## 2019-03-03 DIAGNOSIS — Z0000 Encounter for general adult medical examination without abnormal findings: Secondary | ICD-10-CM

## 2019-03-03 NOTE — Patient Instructions (Signed)
Please schedule your next medicare wellness visit with me in 1 yr.  Continue to eat heart healthy diet (full of fruits, vegetables, whole grains, lean protein, water--limit salt, fat, and sugar intake) and increase physical activity as tolerated.  Continue doing brain stimulating activities (puzzles, reading, adult coloring books, staying active) to keep memory sharp.    [name] , Thank you for taking time to come for your Medicare Wellness Visit. I appreciate your ongoing commitment to your health goals. Please review the following plan we discussed and let me know if I can assist you in the future.   These are the goals we discussed: Goals    . Maintain healthy active lifestyle    . Patient Stated     No declines in health. Continue to serve God and country.       This is a list of the screening recommended for you and due dates:  Health Maintenance  Topic Date Due  . Flu Shot  03/25/2019  . Eye exam for diabetics  06/11/2019  . Hemoglobin A1C  08/09/2019  . Complete foot exam   09/02/2019  . Tetanus Vaccine  07/06/2026  . Pneumonia vaccines  Completed    Health Maintenance After Age 15 After age 78, you are at a higher risk for certain long-term diseases and infections as well as injuries from falls. Falls are a major cause of broken bones and head injuries in people who are older than age 83. Getting regular preventive care can help to keep you healthy and well. Preventive care includes getting regular testing and making lifestyle changes as recommended by your health care provider. Talk with your health care provider about:  Which screenings and tests you should have. A screening is a test that checks for a disease when you have no symptoms.  A diet and exercise plan that is right for you. What should I know about screenings and tests to prevent falls? Screening and testing are the best ways to find a health problem early. Early diagnosis and treatment give you the best  chance of managing medical conditions that are common after age 51. Certain conditions and lifestyle choices may make you more likely to have a fall. Your health care provider may recommend:  Regular vision checks. Poor vision and conditions such as cataracts can make you more likely to have a fall. If you wear glasses, make sure to get your prescription updated if your vision changes.  Medicine review. Work with your health care provider to regularly review all of the medicines you are taking, including over-the-counter medicines. Ask your health care provider about any side effects that may make you more likely to have a fall. Tell your health care provider if any medicines that you take make you feel dizzy or sleepy.  Osteoporosis screening. Osteoporosis is a condition that causes the bones to get weaker. This can make the bones weak and cause them to break more easily.  Blood pressure screening. Blood pressure changes and medicines to control blood pressure can make you feel dizzy.  Strength and balance checks. Your health care provider may recommend certain tests to check your strength and balance while standing, walking, or changing positions.  Foot health exam. Foot pain and numbness, as well as not wearing proper footwear, can make you more likely to have a fall.  Depression screening. You may be more likely to have a fall if you have a fear of falling, feel emotionally low, or feel unable to  do activities that you used to do.  Alcohol use screening. Using too much alcohol can affect your balance and may make you more likely to have a fall. What actions can I take to lower my risk of falls? General instructions  Talk with your health care provider about your risks for falling. Tell your health care provider if: ? You fall. Be sure to tell your health care provider about all falls, even ones that seem minor. ? You feel dizzy, sleepy, or off-balance.  Take over-the-counter and  prescription medicines only as told by your health care provider. These include any supplements.  Eat a healthy diet and maintain a healthy weight. A healthy diet includes low-fat dairy products, low-fat (lean) meats, and fiber from whole grains, beans, and lots of fruits and vegetables. Home safety  Remove any tripping hazards, such as rugs, cords, and clutter.  Install safety equipment such as grab bars in bathrooms and safety rails on stairs.  Keep rooms and walkways well-lit. Activity   Follow a regular exercise program to stay fit. This will help you maintain your balance. Ask your health care provider what types of exercise are appropriate for you.  If you need a cane or walker, use it as recommended by your health care provider.  Wear supportive shoes that have nonskid soles. Lifestyle  Do not drink alcohol if your health care provider tells you not to drink.  If you drink alcohol, limit how much you have: ? 0-1 drink a day for women. ? 0-2 drinks a day for men.  Be aware of how much alcohol is in your drink. In the [place] one drink equals one typical bottle of beer (12 oz), one-half glass of wine (5 oz), or one shot of hard liquor (1 oz).  Do not use any products that contain nicotine or tobacco, such as cigarettes and e-cigarettes. If you need help quitting, ask your health care provider. Summary  Having a healthy lifestyle and getting preventive care can help to protect your health and wellness after age 40.  Screening and testing are the best way to find a health problem early and help you avoid having a fall. Early diagnosis and treatment give you the best chance for managing medical conditions that are more common for people who are older than age 36.  Falls are a major cause of broken bones and head injuries in people who are older than age 29. Take precautions to prevent a fall at home.  Work with your health care provider to learn what changes you can make to  improve your health and wellness and to prevent falls. This information is not intended to replace advice given to you by your health care provider. Make sure you discuss any questions you have with your health care provider. Document Released: 06/23/2017 Document Revised: 12/01/2018 Document Reviewed: 06/23/2017 Elsevier Patient Education  2020 [employer].

## 2019-03-17 ENCOUNTER — Ambulatory Visit: Payer: PPO | Admitting: Internal Medicine

## 2019-03-17 ENCOUNTER — Other Ambulatory Visit: Payer: Self-pay

## 2019-03-17 ENCOUNTER — Encounter: Payer: Self-pay | Admitting: Internal Medicine

## 2019-03-17 DIAGNOSIS — J449 Chronic obstructive pulmonary disease, unspecified: Secondary | ICD-10-CM

## 2019-03-17 NOTE — Progress Notes (Signed)
Subjective:     Patient ID: Phillip Jordan, male    DOB: [birth year]   MRN: [id number]    Brief patient profile:  85  year old white  male, ex-smoker (1ppd x 30 years, quit 1990s), diagnosed with COPD per hx by Dr [name] (fev1 1.38L/44%, ratio 51 in Jan 2009). C/w GOLD III  Criteria    History of Present Illness  05/25/2013 consult/ [name] re: sob/ prev eval MR with GOLD III COPD Chief Complaint  Patient presents with  . Pulmonary Consult    Referred per Dr. Kathlene November. The pt c/o DOE for the past 15 years, but worse for the past 6 months. He states that he gets SOB with walking up hills, but does okay with his exercise program 2-3 times per wk. He also c/o hoarseness and cough "for a long time"- cough is prod in the am with minimal clear sputum.      All his days are about the same in terms of activity tolerance  Breathing got better p quit smoking then worse again starting around mid 1990s Better on dulera, no better on spiriva ? Worse hoarseness so stopped it Work on inhaler technique:    Pantoprazole (protonix) 40 mg   Take 30-60 min before first meal of the day and Pepcid 20 mg one bedtime until return to office - this is the best way to tell whether stomach acid is contributing to your problem.  GERD diet    03/12/2017  f/u ov/Phillip Jordan re:   COPD  III dulera 200 / singulair/ gerd rx  Chief Complaint  Patient presents with  . Follow-up    Breathing has improved back to his normal baseline. No new co's. He has not had to use rescue inhaler.    doe still = mmrc3  rec Plan A = Automatic = Dulera 200 Take 2 puffs first thing in am and then another 2 puffs about 12 hours later Also  Pantoprazole (protonix) 40 mg   Take  30-60 min before first meal of the day and Pepcid (famotidine)  20 mg one @  bedtime until return to office - this is the best way to tell whether stomach acid is contributing to your problem.   Plan B = Backup Only use your albuterol as a rescue medication to be used if  you can't catch your breath by resting or doing a relaxed purse lip breathing pattern.  - The less you use it, the better it will work when you need it. - Ok to use the inhaler up to 2 puffs  every 4 hours if you must but call for appointment if use goes up over your usual need - Don't leave home without it !!  (think of it like the spare tire for your car)  Whenever coughing / wheezing/ short of breath >  Take protonix Take 30- 60 min before your first and last meals of the day and once you are better reduce back to protonix 40 mg Take 30-60 min before first meal of the day  Plan C= Crisis If needing the albuterol (proair) more than a few times a week> Prednisone 10 mg take  4 each am x 2 days,   2 each am x 2 days,  1 each am x 2 days and stop     06/14/2017  f/u ov/Phillip Jordan re: copd III on dulera 200/ singulair/ gerd rx/ pred prn used x one cycle since last ov Chief Complaint  Patient presents with  .  Follow-up    He states his breathing is doing well. He rarely uses his albuterol inhaler.    doe improved to  [facility] = can't walk a nl pace on a flat grade s sob but does fine slow and flat eg does fine walking slow anywhere and also [facility] [employer]  Sleeps ok  Watery rhinitis worse p meals / not sure singulair helping rec Ok to try off singulair to see if worse nasal or lower respiratory symptoms and if so restart     06/15/2018  f/u ov/Phillip Jordan re:  Copd II/ maint rx = dulera 200 2bid / no worse off singulair,  Main concern is cost of care  Chief Complaint  Patient presents with  . Follow-up    Breathing is overall doig well. He states he has been wheezing some for approx 1 wk. He is using his albuterol once daily on average.   Dyspnea:  Works out 3 x weekly on treadmill 20 min x 52mph at 2 degrees  Cough: min/ clear mucus  no am flare despite runny nose x one week prior to [place]: ok flat bed/ one pillow  SABA use: once a day most - has not used contingency prednisone in  past 6 m   09/15/2018  f/u ov/Phillip Jordan re: copd II maint rx dulera 200 2bid/ has not needed prn pred (plan C on action plan) Chief Complaint  Patient presents with  . Follow-up    baseline SOB on exertion, cough with clear mucus  Dyspnea:   No longer doing treadmill but planning to start back  Cough: not much Sleeping: bed flat/ one pillow SABA use: 1-2 x per weekly  02:  None  rec F/u prn     03/17/2019  f/u ov/Phillip Jordan re: copd II/ maint on dulera 200 and pepcid hs for ? Noct gerd  Chief Complaint  Patient presents with  . Follow-up    Breathing is overall doing well but he is not exercising much. He uses his rescue inhaler about once every other wk.   Dyspnea:  Gym closed / some walking in the street, did not do  as well [place] but still did 45 min s stopping, slow pace = MMRC2 = can't walk a nl pace on a flat grade s sob but does fine slow and flat  Cough: no change am cough/ congesiton >min mucoid sputum Sleeping: bed flat/ one pillow  SABA use: rare  02: none    No obvious day to day or daytime variability or assoc excess/ purulent sputum or mucus plugs or hemoptysis or cp or chest tightness, subjective wheeze or overt sinus or hb symptoms.   Sleeping ok  without nocturnal    exacerbation  of respiratory  c/o's or need for noct saba. Also denies any obvious fluctuation of symptoms with weather or environmental changes or other aggravating or alleviating factors except as outlined above   No unusual exposure hx or h/o childhood pna/ asthma or knowledge of premature birth.  Current Allergies, Complete Past Medical History, Past Surgical History, Family History, and Social History were reviewed in [employer] record.  ROS  The following are not active complaints unless bolded Hoarseness, sore throat, dysphagia, dental problems, itching, sneezing,  nasal congestion or discharge of excess mucus or purulent secretions, ear ache,   fever, chills, sweats,  unintended wt loss or wt gain, classically pleuritic or exertional cp,  orthopnea pnd or arm/hand swelling  or  leg swelling, presyncope, palpitations, abdominal pain, anorexia, nausea, vomiting, diarrhea  or change in bowel habits or change in bladder habits, change in stools or change in urine, dysuria, hematuria,  rash, arthralgias/esp R knee , visual complaints, headache, numbness, weakness or ataxia or problems with walking or coordination,  change in mood or  memory.        Current Meds  Medication Sig  . albuterol (VENTOLIN HFA) 108 (90 Base) MCG/ACT inhaler Inhale 2 puffs into the lungs every 4 (four) hours as needed for wheezing or shortness of breath.  [name] amLODipine (NORVASC) 2.5 MG tablet Take 1 tablet (2.5 mg total) by mouth daily.  [name] aspirin 81 MG tablet Take 1 tablet (81 mg total) by mouth daily.  . clopidogrel (PLAVIX) 75 MG tablet Take 1 tablet (75 mg total) by mouth daily. PLAVIX - Should not be held prior to cath procedure unless specifically ordered  . famotidine (PEPCID) 20 MG tablet TAKE 1 TABLET BY MOUTH AT BEDTIME  . glucose blood (FREESTYLE TEST STRIPS) test strip Check blood sugar no more than twice daily  . isosorbide mononitrate (IMDUR) 30 MG 24 hr tablet Take 1 tablet (30 mg total) by mouth 2 (two) times daily.  . meclizine (ANTIVERT) 12.5 MG tablet Take 1 tablet (12.5 mg total) 3 (three) times daily as needed by mouth for dizziness.  . metFORMIN (GLUCOPHAGE) 500 MG tablet Take 1 tablet (500 mg total) by mouth daily with breakfast. Give w/food.  . Misc. Devices (ACAPELLA) MISC Use as directed  . mometasone-formoterol (DULERA) 200-5 MCG/ACT AERO Inhale 2 puffs into the lungs 2 (two) times daily.  . Multiple Vitamin (MULTIVITAMIN WITH MINERALS) TABS tablet Take 1 tablet by mouth daily.  . nitroGLYCERIN (NITROSTAT) 0.4 MG SL tablet Place 1 tablet (0.4 mg total) under the tongue every 5 (five) minutes as needed for chest pain (up to 3 doses only.).  [name] pravastatin (PRAVACHOL)  40 MG tablet Take 1 tablet (40 mg total) by mouth daily.  [name] telmisartan (MICARDIS) 20 MG tablet Take 1 tablet (20 mg total) by mouth daily.               Objective:   Physical Exam   Pleasant amb wm nad   Vital signs reviewed - Note on arrival 02 sats  96% on RA      03/17/2019         179  09/15/2018         178  06/15/2018       173  12/13/2017         175  06/14/2017       173  03/12/2017         170  01/15/2017         172  11/30/2016           175     HEENT: nl dentition / oropharynx. Nl external ear canals without cough reflex -  Mild bilateral non-specific turbinate edema     NECK :  without JVD/Nodes/TM/ nl carotid upstrokes bilaterally   LUNGS: no acc muscle use,  Mod barrel  contour chest wall with bilateral  Distant bs s audible wheeze and  without cough on insp or exp maneuver and mod  Hyperresonant  to  percussion bilaterally     CV:  RRR  no s3 or murmur or increase in P2, and no edema   ABD:  soft and nontender with pos mid insp Hoover's  in the supine position. No  bruits or organomegaly appreciated, bowel sounds nl  MS:     ext warm without deformities, calf tenderness, cyanosis or clubbing No obvious joint restrictions / some crepitance R > L knees   SKIN: warm and dry without lesions    NEURO:  alert, approp, nl sensorium with  no motor or cerebellar deficits apparent.             Assessment & Plan:

## 2019-03-17 NOTE — Patient Instructions (Addendum)
Ok to try off the famotidine to see if it makes any difference with your congestion in am after you try  the bed blocks for a month    GERD (REFLUX)  is an extremely common cause of respiratory symptoms just like yours , many times with no obvious heartburn at all.    It can be treated with medication, but also with lifestyle changes including elevation of the head of your bed (ideally with 6-8inch blocks under the headboard of your bed),  Smoking cessation, avoidance of late meals, excessive alcohol, and avoid fatty foods, chocolate, peppermint, colas, red wine, and acidic juices such as orange juice.  NO MINT OR MENTHOL PRODUCTS SO NO COUGH DROPS  USE SUGARLESS CANDY INSTEAD (Jolley ranchers or Stover's or Life Savers) or even ice chips will also do - the key is to swallow to prevent all throat clearing. NO OIL BASED VITAMINS - use powdered substitutes.  Avoid fish oil when coughing.   Please schedule a follow up visit in 6  months but call sooner if needed

## 2019-03-18 ENCOUNTER — Encounter: Payer: Self-pay | Admitting: Internal Medicine

## 2019-03-18 NOTE — Assessment & Plan Note (Signed)
Quit smoking in 1994   - PFT's 07/02/11  FEV1  1.41 (55%) ratio 66 and 12% better p B2 with DLCO 96% - PFTs  07/05/2013 FEV1  1.05 and 1.31 p B2 (24%) with DLCO 59 corrects to 84%  - Spiriva and other mdi result in hoarseness - hfa 05/25/2013 75% p coaching>  90% 07/05/13  - added gerd rx 05/25/2013 >> improved 07/05/13  - rx dulera 200 2bid 07/05/13 > better 10/03/2013   - 10/08/2015 added GERD rx back due to pm strangling/ hoarseness  - 10/25/2015 added flutter - Rehab completed Aug 2017  - PFT's  05/21/2016  FEV1 1.23 (50 % ) ratio 45  p 32 % improvement from saba p dulera 200 prior to study with DLCO  56/57 % corrects to 72  % for alv volume    - added pred x 6 days as backup plan prn as of 03/12/2017   - 09/15/2018  After extensive coaching inhaler device,  effectiveness =    90%    Not clear whether the am cough/ congestion are related to noct gerd or copd/cb related therefor rec  1) 1st add bed blocks to pepcid x 1 month  2) after one month stop pepcid to see what if any difference this makes    F/u  6 m, sooner if needed

## 2019-03-23 ENCOUNTER — Other Ambulatory Visit: Payer: Self-pay | Admitting: Internal Medicine

## 2019-03-28 ENCOUNTER — Encounter: Payer: Self-pay | Admitting: Internal Medicine

## 2019-03-30 ENCOUNTER — Other Ambulatory Visit: Payer: Self-pay

## 2019-03-30 ENCOUNTER — Ambulatory Visit (INDEPENDENT_AMBULATORY_CARE_PROVIDER_SITE_OTHER): Payer: PPO | Admitting: Internal Medicine

## 2019-03-30 ENCOUNTER — Encounter: Payer: Self-pay | Admitting: Internal Medicine

## 2019-03-30 VITALS — BP 141/52 | HR 88 | Temp 98.0°F | Resp 18 | Ht 69.0 in | Wt 177.2 lb

## 2019-03-30 DIAGNOSIS — H6983 Other specified disorders of Eustachian tube, bilateral: Secondary | ICD-10-CM | POA: Diagnosis not present

## 2019-03-30 MED ORDER — AZELASTINE HCL 0.1 % NA SOLN: 2.0000 | Freq: Every evening | NASAL | 3 refills | Status: DC | PRN

## 2019-03-30 NOTE — Assessment & Plan Note (Signed)
Ear fullness. ET dysfunction?  He has chronic nasal congestion, recommend Flonase, Astelin, call if no better. Minimal wax buildup, removed from the left with a spoon.

## 2019-03-30 NOTE — Patient Instructions (Signed)
Please start using over-the-counter Flonase: 2 sprays in each side of the nose daily  Use Astelin, prescription sent, 2 sprays on each side of the nose at night  Call if not better

## 2019-03-30 NOTE — Progress Notes (Signed)
Pre visit review using our clinic review tool, if applicable. No additional management support is needed unless otherwise documented below in the visit note. 

## 2019-03-30 NOTE — Progress Notes (Signed)
Subjective:    Patient ID: Phillip Jordan, male    DOB: 1929/09/23, 85 y.o.   MRN: [id number]  DOS:  03/30/2019 Type of visit - description: acute Patient reports his ears feel full, both of them. He is HOH, hearing aids are  at the shop for repair, he is just using an old hearing aid sometimes.   BP Readings from Last 3 Encounters:  03/30/19 (!) 141/52  03/17/19 (!) 146/58  03/03/19 132/70    Review of Systems No fever chills No ear pain or discharge No URI type of symptoms except for chronic mild nasal discharge  Past Medical History:  Diagnosis Date  . Allergic rhinitis   . Aortic dissection ([place])    f/u by cards - AAA with distal dissection.  [name] CAD (coronary artery disease)    a. s/p MI 1992. b. Prior hx stenting to RCA/Cx. b. 05/2013: [place] s/p DES to distal RCA then staged DES to prox LAD.   [name] COPD with asthma ([place])   . Diabetes mellitus   . Hyperlipidemia   . Lichen planus    Right midline inferior chest, Dr. [name] Sensing  . Shingles     Past Surgical History:  Procedure Laterality Date  . BACK SURGERY  1993  . CARDIAC CATHETERIZATION     left, w/ coronary angiography and left ventriculograpy  . LEFT HEART CATHETERIZATION WITH CORONARY ANGIOGRAM N/A 06/09/2013   Procedure: LEFT HEART CATHETERIZATION WITH CORONARY ANGIOGRAM;  Surgeon: [name], MD;  [facility] C[facility] LAB;  Service: Cardiovascular;  Laterality: N/A;  . PERCUTANEOUS CORONARY STENT INTERVENTION (PCI-S) N/A 06/12/2013   Procedure: PERCUTANEOUS CORONARY STENT INTERVENTION (PCI-S);  Surgeon: [name], MD;  Location: [facility] CATH LAB;  Service: Cardiovascular;  Laterality: N/A;  prox LAD  . PTCA     w/ placement of drugeluting stent in the distal right coronary artery, and distal Cardiologist: [name]    Social History   Socioeconomic History  . Marital status: Married    Spouse name: Not on file  . Number of children: 3  . Years of education: Not on file  . Highest  education level: Not on file  Occupational History  . Occupation: retired  Scientific laboratory technician  . Financial resource strain: Not on file  . Food insecurity    Worry: Not on file    Inability: Not on file  . Transportation needs    Medical: Not on file    Non-medical: Not on file  Tobacco Use  . Smoking status: Former Smoker    Packs/day: 1.50    Years: 39.00    Pack years: 58.50    Types: Cigarettes    Quit date: 08/24/1992    Years since quitting: 26.6  . Smokeless tobacco: Never Used  Substance and Sexual Activity  . Alcohol use: No  . Drug use: No  . Sexual activity: Not on file  Lifestyle  . Physical activity    Days per week: Not on file    Minutes per session: Not on file  . Stress: Not on file  Relationships  . Social Herbalist on phone: Not on file    Gets together: Not on file    Attends religious service: Not on file    Active member of club or organization: Not on file    Attends meetings of clubs or organizations: Not on file    Relationship status: Not on file  . Intimate partner violence  Fear of current or ex partner: Not on file    Emotionally abused: Not on file    Physically abused: Not on file    Forced sexual activity: Not on file  Other Topics Concern  . Not on file  Social History Narrative   Lives w/ wife    3 kids, 53 [id number]      Allergies as of 03/30/2019   No Known Allergies     Medication List       Accurate as of March 30, 2019  9:19 PM. If you have any questions, ask your nurse or doctor.        Acapella Misc Use as directed   albuterol 108 (90 Base) MCG/ACT inhaler Commonly known as: VENTOLIN HFA Inhale 2 puffs into the lungs every 4 (four) hours as needed for wheezing or shortness of breath.   amLODipine 2.5 MG tablet Commonly known as: NORVASC Take 1 tablet (2.5 mg total) by mouth daily.   aspirin 81 MG tablet Take 1 tablet (81 mg total) by mouth daily.   azelastine 0.1 % nasal spray Commonly known as:  ASTELIN Place 2 sprays into both nostrils at bedtime as needed for rhinitis. Use in each nostril as directed Started by: Kathlene November, MD   clopidogrel 75 MG tablet Commonly known as: PLAVIX Take 1 tablet (75 mg total) by mouth daily. PLAVIX - Should not be held prior to cath procedure unless specifically ordered   famotidine 20 MG tablet Commonly known as: PEPCID TAKE 1 TABLET BY MOUTH AT BEDTIME   glucose blood test strip Commonly known as: FREESTYLE TEST STRIPS Check blood sugar no more than twice daily   isosorbide mononitrate 30 MG 24 hr tablet Commonly known as: IMDUR Take 1 tablet (30 mg total) by mouth 2 (two) times daily.   meclizine 12.5 MG tablet Commonly known as: ANTIVERT Take 1 tablet (12.5 mg total) 3 (three) times daily as needed by mouth for dizziness.   metFORMIN 500 MG tablet Commonly known as: GLUCOPHAGE Take 1 tablet (500 mg total) by mouth daily with breakfast. Give w/food.   mometasone-formoterol 200-5 MCG/ACT Aero Commonly known as: DULERA Inhale 2 puffs into the lungs 2 (two) times daily.   multivitamin with minerals Tabs tablet Take 1 tablet by mouth daily.   nitroGLYCERIN 0.4 MG SL tablet Commonly known as: NITROSTAT Place 1 tablet (0.4 mg total) under the tongue every 5 (five) minutes as needed for chest pain (up to 3 doses only.).   pravastatin 40 MG tablet Commonly known as: PRAVACHOL Take 1 tablet (40 mg total) by mouth daily.   telmisartan 20 MG tablet Commonly known as: MICARDIS Take 1 tablet (20 mg total) by mouth daily.           Objective:   Physical Exam BP (!) 141/52 (BP Location: Left Arm, Patient Position: Sitting, Cuff Size: Small)   Pulse 88   Temp 98 F (36.7 C) (Oral)   Resp 18   Ht 5\' 9"  (1.753 m)   Wt 177 lb 4 oz (80.4 kg)   SpO2 93%   BMI 26.18 kg/m   General:   Well developed, NAD, BMI noted. HEENT:  Normocephalic . Face symmetric, atraumatic Ears: TMs normal, small amount of wax on the left canal which  was removed with a spoon.  Otherwise examination is normal. Nose: Slightly congested, sinuses no TTP Neurologic:  alert & oriented X3.  Speech normal, gait appropriate for age and unassisted Psych--  Cognition and judgment appear  intact.  Cooperative with normal attention span and concentration.  Behavior appropriate. No anxious or depressed appearing.      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 Ronnald Ramp Chronic hoarseness, status post ENT eval ~ 2012 Normal DEXA 2012  PLAN: Ear fullness. ET dysfunction?  He has chronic nasal congestion, recommend Flonase, Astelin, call if no better. Minimal wax buildup, removed from the left with a spoon.

## 2019-04-20 DIAGNOSIS — L570 Actinic keratosis: Secondary | ICD-10-CM | POA: Diagnosis not present

## 2019-04-20 DIAGNOSIS — Z85828 Personal history of other malignant neoplasm of skin: Secondary | ICD-10-CM | POA: Diagnosis not present

## 2019-04-20 DIAGNOSIS — D225 Melanocytic nevi of trunk: Secondary | ICD-10-CM | POA: Diagnosis not present

## 2019-04-20 DIAGNOSIS — L723 Sebaceous cyst: Secondary | ICD-10-CM | POA: Diagnosis not present

## 2019-04-20 DIAGNOSIS — L821 Other seborrheic keratosis: Secondary | ICD-10-CM | POA: Diagnosis not present

## 2019-04-20 DIAGNOSIS — D692 Other nonthrombocytopenic purpura: Secondary | ICD-10-CM | POA: Diagnosis not present

## 2019-04-20 DIAGNOSIS — D1801 Hemangioma of skin and subcutaneous tissue: Secondary | ICD-10-CM | POA: Diagnosis not present

## 2019-04-27 ENCOUNTER — Telehealth: Payer: Self-pay | Admitting: Internal Medicine

## 2019-04-27 MED ORDER — PREDNISONE 10 MG PO TABS: ORAL_TABLET | ORAL | 0 refills | Status: DC

## 2019-04-27 NOTE — Telephone Encounter (Signed)
Prednisone 10 mg take  4 each am x 2 days,   2 each am x 2 days,  1 each am x 2 days and stop and ov in 2 weeks to be sure he gets this 100% resolved

## 2019-04-27 NOTE — Telephone Encounter (Signed)
Spoke with the pt and notified of recs per MW  Rx for pred taper was sent and I have scheduled him for 2 wk f/u

## 2019-04-27 NOTE — Telephone Encounter (Signed)
Please see telephone encounter from 04/27/19. Will sign off.

## 2019-04-27 NOTE — Telephone Encounter (Signed)
Pt sent email: Having problem with asthma requesting Rx for Prednisone if possible.  Thanks in advance.    Called and spoke to pt. Pt c/o mild hoarseness, increase in nonprod cough, and sore throat. Pt denies chest congestion, wheezing, CP/tightness, f/c/s, swelling, or SOB. Pt is requesting prednisone. Pt states overall he feels decent but the cough is becoming troublesome.   Dr. [name] please advise. Thanks.

## 2019-05-07 ENCOUNTER — Other Ambulatory Visit: Payer: Self-pay | Admitting: Internal Medicine

## 2019-05-11 ENCOUNTER — Encounter: Payer: Self-pay | Admitting: Internal Medicine

## 2019-05-11 ENCOUNTER — Other Ambulatory Visit: Payer: Self-pay

## 2019-05-11 ENCOUNTER — Ambulatory Visit: Payer: PPO | Admitting: Internal Medicine

## 2019-05-11 DIAGNOSIS — Z23 Encounter for immunization: Secondary | ICD-10-CM | POA: Diagnosis not present

## 2019-05-11 DIAGNOSIS — J449 Chronic obstructive pulmonary disease, unspecified: Secondary | ICD-10-CM | POA: Diagnosis not present

## 2019-05-11 NOTE — Progress Notes (Signed)
Subjective:     Patient ID: Phillip Jordan, male    DOB: [birth year]   MRN: [id number]    Brief patient profile:  85  year old white  male, ex-smoker (1ppd x 30 years, quit 1990s), diagnosed with COPD per hx by Dr [name] (fev1 1.38L/44%, ratio 51 in Jan 2009). C/w GOLD III  Criteria    History of Present Illness  05/25/2013 consult/ [name] re: sob/ prev eval MR with GOLD III COPD Chief Complaint  Patient presents with  . Pulmonary Consult    Referred per Dr. Kathlene November. The pt c/o DOE for the past 15 years, but worse for the past 6 months. He states that he gets SOB with walking up hills, but does okay with his exercise program 2-3 times per wk. He also c/o hoarseness and cough "for a long time"- cough is prod in the am with minimal clear sputum.      All his days are about the same in terms of activity tolerance  Breathing got better p quit smoking then worse again starting around mid 1990s Better on dulera, no better on spiriva ? Worse hoarseness so stopped it Work on inhaler technique:    Pantoprazole (protonix) 40 mg   Take 30-60 min before first meal of the day and Pepcid 20 mg one bedtime until return to office - this is the best way to tell whether stomach acid is contributing to your problem.  GERD diet    03/12/2017  f/u ov/Phillip Jordan re:   COPD  III dulera 200 / singulair/ gerd rx  Chief Complaint  Patient presents with  . Follow-up    Breathing has improved back to his normal baseline. No new co's. He has not had to use rescue inhaler.    doe still = mmrc3  rec Plan A = Automatic = Dulera 200 Take 2 puffs first thing in am and then another 2 puffs about 12 hours later Also  Pantoprazole (protonix) 40 mg   Take  30-60 min before first meal of the day and Pepcid (famotidine)  20 mg one @  bedtime until return to office - this is the best way to tell whether stomach acid is contributing to your problem.   Plan B = Backup Only use your albuterol as a rescue medication to be used if  you can't catch your breath by resting or doing a relaxed purse lip breathing pattern.  - The less you use it, the better it will work when you need it. - Ok to use the inhaler up to 2 puffs  every 4 hours if you must but call for appointment if use goes up over your usual need - Don't leave home without it !!  (think of it like the spare tire for your car)  Whenever coughing / wheezing/ short of breath >  Take protonix Take 30- 60 min before your first and last meals of the day and once you are better reduce back to protonix 40 mg Take 30-60 min before first meal of the day  Plan C= Crisis If needing the albuterol (proair) more than a few times a week> Prednisone 10 mg take  4 each am x 2 days,   2 each am x 2 days,  1 each am x 2 days and stop     06/14/2017  f/u ov/Phillip Jordan re: copd III on dulera 200/ singulair/ gerd rx/ pred prn used x one cycle since last ov Chief Complaint  Patient presents with  .  Follow-up    He states his breathing is doing well. He rarely uses his albuterol inhaler.    doe improved to  [facility] = can't walk a nl pace on a flat grade s sob but does fine slow and flat eg does fine walking slow anywhere and also [facility]  Sleeps ok  Watery rhinitis worse p meals / not sure singulair helping rec Ok to try off singulair to see if worse nasal or lower respiratory symptoms and if so restart     06/15/2018  f/u ov/Phillip Jordan re:  Copd II/ maint rx = dulera 200 2bid / no worse off singulair,  Main concern is cost of care  Chief Complaint  Patient presents with  . Follow-up    Breathing is overall doig well. He states he has been wheezing some for approx 1 wk. He is using his albuterol once daily on average.   Dyspnea:  Works out 3 x weekly on treadmill 20 min x 44mph at 2 degrees  Cough: min/ clear mucus  no am flare despite runny nose x one week prior to [place]: ok flat bed/ one pillow  SABA use: once a day most - has not used contingency prednisone in  past 6 m       03/17/2019  f/u ov/Phillip Jordan re: copd II/ maint on dulera 200 and pepcid hs for ? Noct gerd  Chief Complaint  Patient presents with  . Follow-up    Breathing is overall doing well but he is not exercising much. He uses his rescue inhaler about once every other wk.   Dyspnea:  Gym closed / some walking in the street, did not do  as well [place] but still did 45 min s stopping, slow pace = MMRC2 = can't walk a nl pace on a flat grade s sob but does fine slow and flat  Cough: no change am cough/ congesiton >min mucoid sputum Sleeping: bed flat/ one pillow  SABA use: rare  rec Ok to try off the famotidine to see if it makes any difference with your congestion in am after you try  the bed blocks for a month   GERD diet   05/11/2019  f/u ov/Phillip Jordan re:  Copd GOLD II/ maint on dulera 200   doing better noct and in am since bed blocks and pepcid at hs (never stopped)  Chief Complaint  Patient presents with  . Follow-up    had round of pred 2 wks ago- SOB and hoarseness has improved. He is using bed blocks and this has helped.    Dyspnea:  MMRC2 = can't walk a nl pace on a flat grade s sob but does fine slow and flat  Cough: better, less congestion esp in am  Sleeping: ok on bed blocks  SABA use: rarely saba  02: no   No obvious day to day or daytime variability or assoc excess/ purulent sputum or mucus plugs or hemoptysis or cp or chest tightness, subjective wheeze or overt sinus or hb symptoms.   Sleeping as above  without nocturnal  or early am exacerbation  of respiratory  c/o's or need for noct saba. Also denies any obvious fluctuation of symptoms with weather or environmental changes or other aggravating or alleviating factors except as outlined above   No unusual exposure hx or h/o childhood pna/ asthma or knowledge of premature birth.  Current Allergies, Complete Past Medical History, Past Surgical History, Family History, and Social  History were reviewed in [facility] record.  ROS  The following are not active complaints unless bolded Hoarseness better , sore throat, dysphagia, dental problems, itching, sneezing,  nasal congestion or discharge of excess mucus or purulent secretions, ear ache,   fever, chills, sweats, unintended wt loss or wt gain, classically pleuritic or exertional cp,  orthopnea pnd or arm/hand swelling  or leg swelling, presyncope, palpitations, abdominal pain, anorexia, nausea, vomiting, diarrhea  or change in bowel habits or change in bladder habits, change in stools or change in urine, dysuria, hematuria,  rash, arthralgias, visual complaints, headache, numbness, weakness or ataxia or problems with walking or coordination,  change in mood or  memory.        Current Meds  Medication Sig  . albuterol (VENTOLIN HFA) 108 (90 Base) MCG/ACT inhaler Inhale 2 puffs into the lungs every 4 (four) hours as needed for wheezing or shortness of breath.  [name] amLODipine (NORVASC) 2.5 MG tablet Take 1 tablet (2.5 mg total) by mouth daily.  [name] aspirin 81 MG tablet Take 1 tablet (81 mg total) by mouth daily.  [name] azelastine (ASTELIN) 0.1 % nasal spray Place 2 sprays into both nostrils at bedtime as needed for rhinitis. Use in each nostril as directed  . clopidogrel (PLAVIX) 75 MG tablet Take 1 tablet (75 mg total) by mouth daily.  . famotidine (PEPCID) 20 MG tablet TAKE 1 TABLET BY MOUTH AT BEDTIME  . fluticasone (FLONASE) 50 MCG/ACT nasal spray Place 2 sprays into both nostrils daily as needed for allergies or rhinitis.  [name] glucose blood (FREESTYLE TEST STRIPS) test strip Check blood sugar no more than twice daily  . isosorbide mononitrate (IMDUR) 30 MG 24 hr tablet Take 1 tablet (30 mg total) by mouth 2 (two) times daily.  . meclizine (ANTIVERT) 12.5 MG tablet Take 1 tablet (12.5 mg total) 3 (three) times daily as needed by mouth for dizziness.  . metFORMIN (GLUCOPHAGE) 500 MG tablet Take 1 tablet (500 mg total) by mouth daily with  breakfast.  . Misc. Devices (ACAPELLA) MISC Use as directed  . mometasone-formoterol (DULERA) 200-5 MCG/ACT AERO Inhale 2 puffs into the lungs 2 (two) times daily.  . Multiple Vitamin (MULTIVITAMIN WITH MINERALS) TABS tablet Take 1 tablet by mouth daily.  . nitroGLYCERIN (NITROSTAT) 0.4 MG SL tablet Place 1 tablet (0.4 mg total) under the tongue every 5 (five) minutes as needed for chest pain (up to 3 doses only.).  [name] pravastatin (PRAVACHOL) 40 MG tablet Take 1 tablet (40 mg total) by mouth daily.  [name] telmisartan (MICARDIS) 20 MG tablet Take 1 tablet (20 mg total) by mouth daily.                  Objective:   Physical Exam    amb wm nad     05/11/2019         174  03/17/2019         179  09/15/2018         178  06/15/2018       173  12/13/2017         175  06/14/2017       173  03/12/2017         170  01/15/2017         172  11/30/2016           175    Vital signs reviewed - Note on arrival 02 sats  96% on RA  Reports upper and lower partials   HEENT : pt wearing mask not removed for exam due to covid - 19 concerns.    NECK :  without JVD/Nodes/TM/ nl carotid upstrokes bilaterally   LUNGS: no acc muscle use,  Mod barrel  contour chest wall with bilateral  Distant bs s audible wheeze and  without cough on insp or exp maneuvers and mod  Hyperresonant  to  percussion bilaterally     CV:  RRR  no s3 or murmur or increase in P2, and no edema   ABD:  soft and nontender with pos mid insp Hoover's  in the supine position. No bruits or organomegaly appreciated, bowel sounds nl  MS:     ext warm without deformities, calf tenderness, cyanosis or clubbing No obvious joint restrictions   SKIN: warm and dry without lesions    NEURO:  alert, approp, nl sensorium with  no motor or cerebellar deficits apparent.               Assessment & Plan:

## 2019-05-11 NOTE — Patient Instructions (Signed)
No change in medications although ok to try off the famotidine at bedtime to see if affects the am congestion   Please schedule a follow up visit in 12 months but call sooner if needed

## 2019-05-12 ENCOUNTER — Encounter: Payer: Self-pay | Admitting: Internal Medicine

## 2019-05-12 NOTE — Assessment & Plan Note (Addendum)
Quit smoking in 1994   - PFT's 07/02/11  FEV1  1.41 (55%) ratio 66 and 12% better p B2 with DLCO 96% - PFTs  07/05/2013 FEV1  1.05 and 1.31 p B2 (24%) with DLCO 59 corrects to 84%  - Spiriva and other mdi result in hoarseness - hfa 05/25/2013 75% p coaching>  90% 07/05/13  - added gerd rx 05/25/2013 >> improved 07/05/13  - rx dulera 200 2bid 07/05/13 > better 10/03/2013   - 10/08/2015 added GERD rx back due to pm strangling/ hoarseness  - 10/25/2015 added flutter - Rehab completed Aug 2017  - PFT's  05/21/2016  FEV1 1.23 (50 % ) ratio 45  p 32 % improvement from saba p dulera 200 prior to study with DLCO  56/57 % corrects to 72  % for alv volume    - added pred x 6 days as backup plan prn as of 03/12/2017   - 09/15/2018  After extensive coaching inhaler device,  effectiveness =    90%    Adequate control on present rx, reviewed in detail with pt > no change in rx needed  > f/u can be yearly / sooner prn   Each maintenance medication was reviewed in detail including most importantly the difference between maintenance and as needed and under what circumstances the prns are to be used.  Please see AVS for specific  Instructions which are unique to this visit and I personally typed out  which were reviewed in detail in writing with the patient and a copy provided.

## 2019-05-23 ENCOUNTER — Ambulatory Visit: Payer: PPO | Admitting: Podiatry

## 2019-05-23 ENCOUNTER — Other Ambulatory Visit: Payer: Self-pay

## 2019-05-23 ENCOUNTER — Encounter: Payer: Self-pay | Admitting: Podiatry

## 2019-05-23 DIAGNOSIS — Q828 Other specified congenital malformations of skin: Secondary | ICD-10-CM

## 2019-05-23 DIAGNOSIS — M79676 Pain in unspecified toe(s): Secondary | ICD-10-CM | POA: Diagnosis not present

## 2019-05-23 DIAGNOSIS — B351 Tinea unguium: Secondary | ICD-10-CM

## 2019-05-23 DIAGNOSIS — E1140 Type 2 diabetes mellitus with diabetic neuropathy, unspecified: Secondary | ICD-10-CM | POA: Insufficient documentation

## 2019-05-23 DIAGNOSIS — E1142 Type 2 diabetes mellitus with diabetic polyneuropathy: Secondary | ICD-10-CM | POA: Diagnosis not present

## 2019-05-23 NOTE — Progress Notes (Signed)
Patient ID: Phillip Jordan, male   DOB: 1930-07-09, 85 y.o.   MRN: [id number] Complaint:  Visit Type: Patient returns to my office for continued preventative foot care services. Complaint: Patient states" my nails have grown long and thick and become painful to walk and wear shoes" Patient has been diagnosed with DM . The patient presents for preventative foot care services. No changes to ROS.  He has been under treatment for COPD. Patient says his right knee is painful causing him to alter his gait.  Podiatric Exam: Vascular: dorsalis pedis and posterior tibial pulses are palpable bilateral. Capillary return is immediate. Temperature gradient is WNL. Skin turgor WNL  Sensorium: Absent  Semmes Weinstein monofilament test except toes both feet.. Absent  tactile sensation bilaterally. Nail Exam: Pt has thick disfigured discolored nails with subungual debris noted bilateral entire nail hallux through fifth toenails Ulcer Exam: There is no evidence of ulcer or pre-ulcerative changes or infection. Orthopedic Exam: Muscle tone and strength are WNL. No limitations in general ROM. No crepitus or effusions noted. Foot type and digits show no abnormalities. Bony prominences are unremarkable. Skin:  Symptomatic porokeratosis sub 1 right foot with hemorrhagic callus .  There are two separate healing blood blisters lateral to callus sub 1 right.. porokeratosis noted. No infection or ulcers  Diagnosis:  Onychomycosis, , Pain in right toe, pain in left toe  Debride porokeratosis  Treatment & Plan Procedures and Treatment: Consent by patient was obtained for treatment procedures. The patient understood the discussion of treatment and procedures well. All questions were answered thoroughly reviewed. Debridement of mycotic and hypertrophic toenails, 1 through 5 bilateral and clearing of subungual debris. No ulceration, no infection noted. Debride callus sub 1 right.  I recommended he make an appointment with Liliane Channel for  off weight bearing insoles. Return Visit-Office Procedure: Patient instructed to return to the office for a follow up visit 4 months for continued evaluation and treatment.    [name] DPM

## 2019-05-24 ENCOUNTER — Encounter: Payer: Self-pay | Admitting: Internal Medicine

## 2019-05-24 DIAGNOSIS — M25561 Pain in right knee: Secondary | ICD-10-CM | POA: Diagnosis not present

## 2019-05-24 DIAGNOSIS — M1711 Unilateral primary osteoarthritis, right knee: Secondary | ICD-10-CM | POA: Diagnosis not present

## 2019-05-25 ENCOUNTER — Other Ambulatory Visit: Payer: Self-pay

## 2019-05-25 ENCOUNTER — Ambulatory Visit: Payer: Self-pay

## 2019-05-25 NOTE — Telephone Encounter (Signed)
Needs appt please

## 2019-05-25 NOTE — Telephone Encounter (Signed)
Pt. Reports last Friday he started having urinary frequency and discharge from penis. No other symptoms. Requests a visit. No answer at the practice. Please advise pt. Contact number [phone].  Answer Assessment - Initial Assessment Questions 1. SYMPTOM: "What's the main symptom you're concerned about?" (e.g., frequency, incontinence)     Pus from penis 2. ONSET: "When did the  pus start?"     Friday 3. PAIN: "Is there any pain?" If so, ask: "How bad is it?" (Scale: 1-10; mild, moderate, severe)     No pain 4. CAUSE: "What do you think is causing the symptoms?"     Maybe prostate 5. OTHER SYMPTOMS: "Do you have any other symptoms?" (e.g., fever, flank pain, blood in urine, pain with urination)     Frequency 6. PREGNANCY: "Is there any chance you are pregnant?" "When was your last menstrual period?"     n/a  Protocols used: URINARY [facility]

## 2019-05-26 ENCOUNTER — Ambulatory Visit (INDEPENDENT_AMBULATORY_CARE_PROVIDER_SITE_OTHER): Payer: PPO | Admitting: Family Medicine

## 2019-05-26 ENCOUNTER — Encounter: Payer: Self-pay | Admitting: Family Medicine

## 2019-05-26 VITALS — BP 138/64 | HR 76 | Temp 96.7°F | Ht 69.0 in | Wt 173.0 lb

## 2019-05-26 DIAGNOSIS — R369 Urethral discharge, unspecified: Secondary | ICD-10-CM

## 2019-05-26 MED ORDER — NITROFURANTOIN MONOHYD MACRO 100 MG PO CAPS: 100.0000 mg | ORAL_CAPSULE | Freq: Two times a day (BID) | ORAL | 0 refills | Status: AC

## 2019-05-26 NOTE — Progress Notes (Signed)
Chief Complaint  Patient presents with  . Urinary Frequency    at night for one week    Subjective: Patient is a 85 y.o. male here for urinary freq at night.  This has been going on for around 1 week. Discharge also. He is not circumcised. No hx of UTI. No new sexual partners, fevers, abd pain, bleeding, flank pain. Urinating 3-4 times per night.   ROS: Const: no fevers  Past Medical History:  Diagnosis Date  . Allergic rhinitis   . Aortic dissection ([place])    f/u by cards - AAA with distal dissection.  [name] CAD (coronary artery disease)    a. s/p MI 1992. b. Prior hx stenting to RCA/Cx. b. 05/2013: [place] s/p DES to distal RCA then staged DES to prox LAD.   [name] COPD with asthma ([place])   . Diabetes mellitus   . Hyperlipidemia   . Lichen planus    Right midline inferior chest, Dr. [name] Sensing  . Shingles     Objective: BP 138/64 (BP Location: Left Arm, Patient Position: Sitting, Cuff Size: Normal)   Pulse 76   Temp (!) 96.7 F (35.9 C) (Temporal)   Ht 5\' 9"  (1.753 m)   Wt 173 lb (78.5 kg)   SpO2 94%   BMI 25.55 kg/m  General: Awake, appears stated age HEENT: MMM, EOMi Heart: RRR, no murmurs Lungs: CTAB, no rales, wheezes or rhonchi. No accessory muscle use Abdomen: Soft, tender, nondistended, bowel sounds present Rectal: Sphincter of normal tone, prostate is enlarged without tenderness or bogginess Psych: Age appropriate judgment and insight, normal affect and mood  Assessment and Plan: Penile discharge - Plan: nitrofurantoin, macrocrystal-monohydrate, (MACROBID) 100 MG capsule  Increased frequency plus discharge without prostate tenderness, will treat for UTI empirically.  Unable to leave a urine sample today unfortunately.  Follow-up in 1 week if no improvement.  Consider BPH treatment at that time. The patient voiced understanding and agreement to the plan.  [place], DO 05/26/19  12:16 PM

## 2019-05-26 NOTE — Patient Instructions (Addendum)
Try to leave a urine sample with [place] at some point next week.   Cancel appt if you are doing better.  Stay hydrated.  Let us know if you need anything.

## 2019-05-30 ENCOUNTER — Other Ambulatory Visit: Payer: Self-pay

## 2019-05-30 ENCOUNTER — Other Ambulatory Visit (INDEPENDENT_AMBULATORY_CARE_PROVIDER_SITE_OTHER): Payer: PPO

## 2019-05-30 ENCOUNTER — Other Ambulatory Visit: Payer: Self-pay | Admitting: Family Medicine

## 2019-05-30 DIAGNOSIS — R369 Urethral discharge, unspecified: Secondary | ICD-10-CM

## 2019-05-30 LAB — URINALYSIS
Bilirubin Urine: NEGATIVE
Hgb urine dipstick: NEGATIVE
Ketones, ur: NEGATIVE
Leukocytes,Ua: NEGATIVE
Nitrite: NEGATIVE
Specific Gravity, Urine: 1.015 (ref 1.000–1.030)
Total Protein, Urine: NEGATIVE
Urine Glucose: NEGATIVE
Urobilinogen, UA: 0.2 (ref 0.0–1.0)
pH: 6 (ref 5.0–8.0)

## 2019-05-30 NOTE — Progress Notes (Signed)
ua

## 2019-06-02 ENCOUNTER — Ambulatory Visit: Payer: PPO | Admitting: Internal Medicine

## 2019-06-12 LAB — HM DIABETES EYE EXAM

## 2019-06-13 ENCOUNTER — Ambulatory Visit: Payer: PPO | Admitting: Orthotics

## 2019-06-13 ENCOUNTER — Other Ambulatory Visit: Payer: Self-pay

## 2019-06-13 DIAGNOSIS — E1142 Type 2 diabetes mellitus with diabetic polyneuropathy: Secondary | ICD-10-CM

## 2019-06-13 DIAGNOSIS — E119 Type 2 diabetes mellitus without complications: Secondary | ICD-10-CM

## 2019-06-13 NOTE — Progress Notes (Signed)

## 2019-06-19 ENCOUNTER — Encounter: Payer: Self-pay | Admitting: Internal Medicine

## 2019-06-22 ENCOUNTER — Telehealth: Payer: Self-pay

## 2019-06-22 NOTE — Telephone Encounter (Signed)
Form received/completed and faxed back to [employer] at [phone]. Form sent for scanning. Received fax confirmation.

## 2019-07-04 NOTE — Progress Notes (Signed)
Date:  07/10/2019   ID:  Phillip Jordan, DOB 01/27/1930, MRN [id number]  Provider Location: Office  PCP:  Phillip Branch, MD  Cardiologist:  Phillip Jordan Electrophysiologist:  None   Evaluation Performed:  Follow-Up Visit  Chief Complaint:  CAD  History of Present Illness:    85 y.o. . with distant history of CAD with DES to proximal RCA and mid LAD in October 2014   Significant COPD Gold 3 followed by pulmonary Dr Phillip Jordan Stopped smoking in 1994  He thinks he is doing well with baseline dyspnea and no chest pain  Complains of legs hurting rest/exercise but had normal ABI's done October 2018 ? neuropathy   Has not had recent stress testing but active with no angina   Wife has issue with elevated LFTls and is being w/u at [facility]   The patient does not have symptoms concerning for COVID-19 infection (fever, chills, cough, or new shortness of breath).    Past Medical History:  Diagnosis Date  . Allergic rhinitis   . Aortic dissection ([place])    f/u by cards - AAA with distal dissection.  [name] CAD (coronary artery disease)    a. s/p MI 1992. b. Prior hx stenting to RCA/Cx. b. 05/2013: [place] s/p DES to distal RCA then staged DES to prox LAD.   [name] COPD with asthma ([place])   . Diabetes mellitus   . Hyperlipidemia   . Lichen planus    Right midline inferior chest, Dr. [name] Jordan  . Shingles    Past Surgical History:  Procedure Laterality Date  . BACK SURGERY  1993  . CARDIAC CATHETERIZATION     left, w/ coronary angiography and left ventriculograpy  . LEFT HEART CATHETERIZATION WITH CORONARY ANGIOGRAM N/A 06/09/2013   Procedure: LEFT HEART CATHETERIZATION WITH CORONARY ANGIOGRAM;  Surgeon: Phillip Hector, MD;  Location: [facility] CATH LAB;  Service: Cardiovascular;  Laterality: N/A;  . PERCUTANEOUS CORONARY STENT INTERVENTION (PCI-S) N/A 06/12/2013   Procedure: PERCUTANEOUS CORONARY STENT INTERVENTION (PCI-S);  Surgeon: Phillip Blanks, MD;  Location: [facility] CATH LAB;  Service:  Cardiovascular;  Laterality: N/A;  prox LAD  . PTCA     w/ placement of drugeluting stent in the distal right coronary artery, and distal Cardiologist: Phillip Jordan     Current Meds  Medication Sig  . albuterol (VENTOLIN HFA) 108 (90 Base) MCG/ACT inhaler Inhale 2 puffs into the lungs every 4 (four) hours as needed for wheezing or shortness of breath.  [name] amLODipine (NORVASC) 2.5 MG tablet Take 1 tablet (2.5 mg total) by mouth daily.  [name] aspirin 81 MG tablet Take 1 tablet (81 mg total) by mouth daily.  [name] azelastine (ASTELIN) 0.1 % nasal spray Place 2 sprays into both nostrils at bedtime as needed for rhinitis. Use in each nostril as directed  . clopidogrel (PLAVIX) 75 MG tablet Take 1 tablet (75 mg total) by mouth daily.  . famotidine (PEPCID) 20 MG tablet TAKE 1 TABLET BY MOUTH AT BEDTIME  . fluticasone (FLONASE) 50 MCG/ACT nasal spray Place 2 sprays into both nostrils daily as needed for allergies or rhinitis.  [name] glucose blood (FREESTYLE TEST STRIPS) test strip Check blood sugar no more than twice daily  . isosorbide mononitrate (IMDUR) 30 MG 24 hr tablet Take 1 tablet (30 mg total) by mouth 2 (two) times daily.  . meclizine (ANTIVERT) 12.5 MG tablet Take 1 tablet (12.5 mg total) 3 (three) times daily as needed by mouth for dizziness.  [name]  metFORMIN (GLUCOPHAGE) 500 MG tablet Take 1 tablet (500 mg total) by mouth daily with breakfast.  . Misc. Devices (ACAPELLA) MISC Use as directed  . mometasone-formoterol (DULERA) 200-5 MCG/ACT AERO Inhale 2 puffs into the lungs 2 (two) times daily.  . Multiple Vitamin (MULTIVITAMIN WITH MINERALS) TABS tablet Take 1 tablet by mouth daily.  . nitroGLYCERIN (NITROSTAT) 0.4 MG SL tablet Place 1 tablet (0.4 mg total) under the tongue every 5 (five) minutes as needed for chest pain (up to 3 doses only.).  [name] pravastatin (PRAVACHOL) 40 MG tablet Take 1 tablet (40 mg total) by mouth daily.  [name] telmisartan (MICARDIS) 20 MG tablet Take 1 tablet (20 mg total) by mouth  daily.     Allergies:   Patient has no known allergies.   Social History   Tobacco Use  . Smoking status: Former Smoker    Packs/day: 1.50    Years: 39.00    Pack years: 58.50    Types: Cigarettes    Quit date: 08/24/1992    Years since quitting: 26.8  . Smokeless tobacco: Never Used  Substance Use Topics  . Alcohol use: No  . Drug use: No     Family Hx: The patient's family history includes Asthma in his mother; Cancer in his mother; Heart disease in his father and mother. There is no history of Prostate cancer or Phillip cancer.  ROS:   Please see the history of present illness.     All other systems reviewed and are negative.   Prior CV studies:   The following studies were reviewed today:  AAA [place] 02/18/18 no AAA   Labs/Other Tests and Data Reviewed:    EKG:   07/10/19 SR rate 81 PR 224 RBBB no acute changes   Recent Labs: 09/06/2018: ALT 16; Hemoglobin 14.5; Platelets 299.0 02/07/2019: BUN 16; Creatinine, Ser 0.99; Potassium 5.2 No hemolysis seen; Sodium 135   Recent Lipid Panel Lab Results  Component Value Date/Time   CHOL 157 09/06/2018 08:43 AM   TRIG 126.0 09/06/2018 08:43 AM   TRIG 181 09/06/2009 04:02 PM   HDL 38.60 (L) 09/06/2018 08:43 AM   CHOLHDL 4 09/06/2018 08:43 AM   LDLCALC 93 09/06/2018 08:43 AM    Wt Readings from Last 3 Encounters:  07/10/19 174 lb (78.9 kg)  05/26/19 173 lb (78.5 kg)  05/11/19 174 lb 12.8 oz (79.3 kg)     Objective:    Vital Signs:  BP 130/64   Pulse 81   Ht 5\' 9"  (1.753 m)   Wt 174 lb (78.9 kg)   SpO2 93%   BMI 25.70 kg/m    Affect appropriate Healthy:  appears stated age HEENT: normal Neck supple with no adenopathy JVP normal no bruits no thyromegaly Lungs clear with no wheezing and good diaphragmatic motion Heart:  S1/S2 no murmur, no rub, gallop or click PMI normal Abdomen: benighn, BS positve, no tenderness, no AAA no bruit.  No HSM or HJR Distal pulses intact with no bruits No edema Neuro non-focal  Skin warm and dry No muscular weakness  ASSESSMENT & PLAN:    CAD: .Stable with no angina and good activity level.  DES to RCA in 2014 Continue medical Rx New nitro called in  HTN: Well controlled.  Continue current medications and low sodium Dash type diet.    AAA: upper normal 2.5 cm  No abdominal pain observe given age   DM:: Discussed low carb diet.  Target hemoglobin A1c is 6.5 or less.  Continue  current medications.  Chol:  Cholesterol is at goal.  Continue current dose of statin and diet Rx.  No myalgias or side effects.  F/U  LFT's in 6 months. Recent Labs       Lab Results  Component Value Date   LDLCALC 88 11/09/2017     COPD: no active wheezing continue Dulera f/u pulmonary Dr Phillip Jordan   Leg Pain: not vascular normal ABI's done 07/07/17    COVID-19 Education: The signs and symptoms of COVID-19 were discussed with the patient and how to seek care for testing (follow up with PCP or arrange E-visit).  The importance of social distancing was discussed today.  Time:   Today, I have spent 30 minutes with the patient       Medication Adjustments/Labs and Tests Ordered: Current medicines are reviewed at length with the patient today.  Concerns regarding medicines are outlined above.   Tests Ordered: No orders of the defined types were placed in this encounter.   Medication Changes: No orders of the defined types were placed in this encounter.   Disposition:  Follow up in a year   Signed, Jenkins Rouge, MD  07/10/2019 9:43 AM    [place]

## 2019-07-07 ENCOUNTER — Other Ambulatory Visit: Payer: Self-pay

## 2019-07-07 ENCOUNTER — Ambulatory Visit: Payer: PPO | Admitting: Orthotics

## 2019-07-10 ENCOUNTER — Encounter: Payer: Self-pay | Admitting: Cardiovascular Disease

## 2019-07-10 ENCOUNTER — Ambulatory Visit: Payer: PPO | Admitting: Cardiovascular Disease

## 2019-07-10 ENCOUNTER — Other Ambulatory Visit: Payer: Self-pay

## 2019-07-10 VITALS — BP 130/64 | HR 81 | Ht 69.0 in | Wt 174.0 lb

## 2019-07-10 DIAGNOSIS — I2510 Atherosclerotic heart disease of native coronary artery without angina pectoris: Secondary | ICD-10-CM | POA: Diagnosis not present

## 2019-07-10 NOTE — Patient Instructions (Signed)
Medication Instructions:  *If you need a refill on your cardiac medications before your next appointment, please call your pharmacy*  Lab Work: If you have labs (blood work) drawn today and your tests are completely normal, you will receive your results only by: . MyChart Message (if you have MyChart) OR . A paper copy in the mail If you have any lab test that is abnormal or we need to change your treatment, we will call you to review the results.  Testing/Procedures: None ordered today.  Follow-Up: At [facility], you and your health needs are our priority.  As part of our continuing mission to provide you with exceptional heart care, we have created designated Provider Care Teams.  These Care Teams include your primary Cardiologist (physician) and Advanced Practice Providers (APPs -  Physician Assistants and Nurse Practitioners) who all work together to provide you with the care you need, when you need it.  Your next appointment:   12 month(s)  The format for your next appointment:   In Person  Provider:   You may see Dr. Nishan or one of the following Advanced Practice Providers on your designated Care Team:    Lori Gerhardt, NP  Laura Ingold, NP  Jill McDaniel, NP   

## 2019-07-19 ENCOUNTER — Other Ambulatory Visit: Payer: Self-pay

## 2019-07-19 ENCOUNTER — Ambulatory Visit (INDEPENDENT_AMBULATORY_CARE_PROVIDER_SITE_OTHER): Payer: PPO | Admitting: Orthotics

## 2019-07-19 DIAGNOSIS — E1142 Type 2 diabetes mellitus with diabetic polyneuropathy: Secondary | ICD-10-CM

## 2019-07-19 DIAGNOSIS — E119 Type 2 diabetes mellitus without complications: Secondary | ICD-10-CM

## 2019-07-19 DIAGNOSIS — Q828 Other specified congenital malformations of skin: Secondary | ICD-10-CM

## 2019-07-24 ENCOUNTER — Other Ambulatory Visit: Payer: Self-pay | Admitting: Internal Medicine

## 2019-07-24 NOTE — Progress Notes (Signed)

## 2019-08-02 ENCOUNTER — Telehealth: Payer: Self-pay | Admitting: Internal Medicine

## 2019-08-02 NOTE — Telephone Encounter (Signed)
Magda Paganini, do you still have the forms?

## 2019-08-02 NOTE — Telephone Encounter (Signed)
dulera 200 Take 2 puffs first thing in am and then another 2 puffs about 12 hours later is the right rx

## 2019-08-02 NOTE — Telephone Encounter (Signed)
The forms are in his lookat to be signed  Will wait for MW to return and then call the pt once done

## 2019-08-02 NOTE — Telephone Encounter (Signed)
Dr [name]- please sign form placed in your lookat and advise if you want him on the 100 or 200 Dulera  He wrote a note on the form stating he thought it was supposed to be the lower dose, but I was unsure, thanks!

## 2019-08-04 NOTE — Telephone Encounter (Signed)
Dr Molli Posey was just getting ready to mail the forms and I noticed you wrote Dulera 100 on them  Dis you mean to do this? Pt was already under the impression that this is what you wanted him on, but you stated that he needed the 200 earlier in this msg. Please advise before I mail off, thanks!

## 2019-08-04 NOTE — Telephone Encounter (Signed)
Unless he's noticed worse on the 100, it causes less hoarseness so that's why the change

## 2019-08-08 ENCOUNTER — Other Ambulatory Visit: Payer: Self-pay

## 2019-08-08 NOTE — Telephone Encounter (Signed)
Magda Paganini, has this been mailed out yet? Thanks.

## 2019-08-09 ENCOUNTER — Ambulatory Visit (INDEPENDENT_AMBULATORY_CARE_PROVIDER_SITE_OTHER): Payer: PPO | Admitting: Internal Medicine

## 2019-08-09 ENCOUNTER — Encounter: Payer: Self-pay | Admitting: Internal Medicine

## 2019-08-09 ENCOUNTER — Other Ambulatory Visit: Payer: Self-pay

## 2019-08-09 VITALS — BP 140/70 | HR 79 | Temp 97.1°F | Resp 18 | Ht 69.0 in | Wt 173.0 lb

## 2019-08-09 DIAGNOSIS — E785 Hyperlipidemia, unspecified: Secondary | ICD-10-CM

## 2019-08-09 DIAGNOSIS — I10 Essential (primary) hypertension: Secondary | ICD-10-CM

## 2019-08-09 DIAGNOSIS — I2510 Atherosclerotic heart disease of native coronary artery without angina pectoris: Secondary | ICD-10-CM | POA: Diagnosis not present

## 2019-08-09 DIAGNOSIS — E11319 Type 2 diabetes mellitus with unspecified diabetic retinopathy without macular edema: Secondary | ICD-10-CM | POA: Diagnosis not present

## 2019-08-09 LAB — COMPREHENSIVE METABOLIC PANEL
ALT: 16 U/L (ref 0–53)
AST: 15 U/L (ref 0–37)
Albumin: 4.4 g/dL (ref 3.5–5.2)
Alkaline Phosphatase: 90 U/L (ref 39–117)
BUN: 13 mg/dL (ref 6–23)
CO2: 32 mEq/L (ref 19–32)
Calcium: 9.9 mg/dL (ref 8.4–10.5)
Chloride: 96 mEq/L (ref 96–112)
Creatinine, Ser: 0.91 mg/dL (ref 0.40–1.50)
GFR: 78.76 mL/min (ref >60.00)
Glucose, Bld: 122 mg/dL — ABNORMAL HIGH (ref 70–99)
Potassium: 5.2 mEq/L — ABNORMAL HIGH (ref 3.5–5.1)
Sodium: 132 mEq/L — ABNORMAL LOW (ref 135–145)
Total Bilirubin: 0.4 mg/dL (ref 0.2–1.2)
Total Protein: 6.8 g/dL (ref 6.0–8.3)

## 2019-08-09 LAB — LIPID PANEL
Cholesterol: 152 mg/dL (ref 0–200)
HDL: 41 mg/dL (ref >39.00)
LDL Cholesterol: 87 mg/dL (ref 0–99)
NonHDL: 110.74
Total CHOL/HDL Ratio: 4
Triglycerides: 117 mg/dL (ref 0.0–149.0)
VLDL: 23.4 mg/dL (ref 0.0–40.0)

## 2019-08-09 LAB — CBC
HCT: 42.7 % (ref 39.0–52.0)
Hemoglobin: 14.5 g/dL (ref 13.0–17.0)
MCHC: 34 g/dL (ref 30.0–36.0)
MCV: 91 fl (ref 78.0–100.0)
Platelets: 305 10*3/uL (ref 150.0–400.0)
RBC: 4.69 Mil/uL (ref 4.22–5.81)
RDW: 13.1 % (ref 11.5–15.5)
WBC: 8.3 10*3/uL (ref 4.0–10.5)

## 2019-08-09 LAB — HEMOGLOBIN A1C: Hgb A1c MFr Bld: 6.4 % (ref 4.6–6.5)

## 2019-08-09 NOTE — Patient Instructions (Signed)
GO TO THE LAB : Get the blood work     GO TO THE FRONT DESK Schedule your next appointment   for a checkup in 4 months   Check your blood pressure weekly BP GOAL is between 110/65 and  135/85. If it is consistently higher or lower, let me know    Be sure you take Dulera twice a day If you are using your rescue inhaler more than 3-4 times a week please call me or Dr. [name]

## 2019-08-09 NOTE — Progress Notes (Signed)
Subjective:    Patient ID: Phillip Jordan, male    DOB: 11-01-1929, 85 y.o.   MRN: [id number]  DOS:  08/09/2019 Type of visit - description: Routine office visit CAD: Cardiology note reviewed HTN: Good med compliance, ambulatory BPs in the 120/68s COPD asthma: Occasional wheezing, using her rescue inhaler no more than 2 times a week. High cholesterol: Due for labs    Review of Systems Denies fever chills No chest pain Shortness of breath: At baseline, episodic difficulty breathing that is helped by inhalers On dual antiplatelet: Denies gross hematuria or blood in the stools.   Past Medical History:  Diagnosis Date  . Allergic rhinitis   . Aortic dissection ([place])    f/u by cards - AAA with distal dissection.  [name] CAD (coronary artery disease)    a. s/p MI 1992. b. Prior hx stenting to RCA/Cx. b. 05/2013: [place] s/p DES to distal RCA then staged DES to prox LAD.   [name] COPD with asthma ([place])   . Diabetes mellitus   . Hyperlipidemia   . Lichen planus    Right midline inferior chest, Dr. [name] Sensing  . Shingles     Past Surgical History:  Procedure Laterality Date  . BACK SURGERY  1993  . CARDIAC CATHETERIZATION     left, w/ coronary angiography and left ventriculograpy  . LEFT HEART CATHETERIZATION WITH CORONARY ANGIOGRAM N/A 06/09/2013   Procedure: LEFT HEART CATHETERIZATION WITH CORONARY ANGIOGRAM;  Surgeon: [name], MD;  Location: [facility] CATH LAB;  Service: Cardiovascular;  Laterality: N/A;  . PERCUTANEOUS CORONARY STENT INTERVENTION (PCI-S) N/A 06/12/2013   Procedure: PERCUTANEOUS CORONARY STENT INTERVENTION (PCI-S);  Surgeon: [name], MD;  Location: [facility] CATH LAB;  Service: Cardiovascular;  Laterality: N/A;  prox LAD  . PTCA     w/ placement of drugeluting stent in the distal right coronary artery, and distal Cardiologist: [name]    Social History   Socioeconomic History  . Marital status: Married    Spouse name: Not on file  . Number of  children: 3  . Years of education: Not on file  . Highest education level: Not on file  Occupational History  . Occupation: retired  Tobacco Use  . Smoking status: Former Smoker    Packs/day: 1.50    Years: 39.00    Pack years: 58.50    Types: Cigarettes    Quit date: 08/24/1992    Years since quitting: 26.9  . Smokeless tobacco: Never Used  Substance and Sexual Activity  . Alcohol use: No  . Drug use: No  . Sexual activity: Not on file  Other Topics Concern  . Not on file  Social History Narrative   Lives w/ wife    3 kids, 44 Gk-GGK   Social Determinants of Health   Financial Resource Strain:   . Difficulty of Paying Living Expenses: Not on file  Food Insecurity:   . Worried About Charity fundraiser in the Last Year: Not on file  . Ran Out of Food in the Last Year: Not on file  Transportation Needs:   . Lack of Transportation (Medical): Not on file  . Lack of Transportation (Non-Medical): Not on file  Physical Activity:   . Days of Exercise per Week: Not on file  . Minutes of Exercise per Session: Not on file  Stress:   . Feeling of Stress : Not on file  Social Connections:   . Frequency of Communication with Friends and Family:  Not on file  . Frequency of Social Gatherings with Friends and Family: Not on file  . Attends Religious Services: Not on file  . Active Member of Clubs or Organizations: Not on file  . Attends Archivist Meetings: Not on file  . Marital Status: Not on file  Intimate Partner Violence:   . Fear of Current or Ex-Partner: Not on file  . Emotionally Abused: Not on file  . Physically Abused: Not on file  . Sexually Abused: Not on file      Allergies as of 08/09/2019   No Known Allergies     Medication List       Accurate as of August 09, 2019  4:21 PM. If you have any questions, ask your nurse or doctor.        Acapella Misc Use as directed   albuterol 108 (90 Base) MCG/ACT inhaler Commonly known as: VENTOLIN  HFA Inhale 2 puffs into the lungs every 4 (four) hours as needed for wheezing or shortness of breath.   amLODipine 2.5 MG tablet Commonly known as: NORVASC Take 1 tablet (2.5 mg total) by mouth daily.   aspirin 81 MG tablet Take 1 tablet (81 mg total) by mouth daily.   azelastine 0.1 % nasal spray Commonly known as: ASTELIN Place 2 sprays into both nostrils at bedtime as needed for rhinitis. Use in each nostril as directed   clopidogrel 75 MG tablet Commonly known as: PLAVIX Take 1 tablet (75 mg total) by mouth daily.   famotidine 20 MG tablet Commonly known as: PEPCID TAKE 1 TABLET BY MOUTH AT BEDTIME   fluticasone 50 MCG/ACT nasal spray Commonly known as: FLONASE Place 2 sprays into both nostrils daily as needed for allergies or rhinitis.   glucose blood test strip Commonly known as: FREESTYLE TEST STRIPS Check blood sugar no more than twice daily   isosorbide mononitrate 30 MG 24 hr tablet Commonly known as: IMDUR Take 1 tablet (30 mg total) by mouth 2 (two) times daily.   meclizine 12.5 MG tablet Commonly known as: ANTIVERT Take 1 tablet (12.5 mg total) 3 (three) times daily as needed by mouth for dizziness.   metFORMIN 500 MG tablet Commonly known as: GLUCOPHAGE Take 1 tablet (500 mg total) by mouth daily with breakfast.   mometasone-formoterol 200-5 MCG/ACT Aero Commonly known as: DULERA Inhale 2 puffs into the lungs 2 (two) times daily.   multivitamin with minerals Tabs tablet Take 1 tablet by mouth daily.   nitroGLYCERIN 0.4 MG SL tablet Commonly known as: NITROSTAT Place 1 tablet (0.4 mg total) under the tongue every 5 (five) minutes as needed for chest pain (up to 3 doses only.).   pravastatin 40 MG tablet Commonly known as: PRAVACHOL Take 1 tablet (40 mg total) by mouth daily.   telmisartan 20 MG tablet Commonly known as: MICARDIS Take 1 tablet (20 mg total) by mouth daily.           Objective:   Physical Exam BP 140/70 (BP Location:  Right Arm, Patient Position: Sitting, Cuff Size: Normal)   Pulse 79   Temp (!) 97.1 F (36.2 C) (Temporal)   Resp 18   Ht 5\' 9"  (1.753 m)   Wt 173 lb (78.5 kg)   SpO2 96%   BMI 25.55 kg/m  General:   Well developed, NAD, BMI noted. HEENT:  Normocephalic . Face symmetric, atraumatic Lungs:  Few wheezes bilaterally, no rhonchi. Normal respiratory effort, no intercostal retractions, no accessory muscle use. Heart: RRR,  no murmur.  No pretibial edema bilaterally  Skin: Not pale. Not jaundice Neurologic:  alert & oriented X3.  Speech normal, gait appropriate for age and unassisted Psych--  Cognition and judgment appear intact.  Cooperative with normal attention span and concentration.  Behavior appropriate. No anxious or depressed appearing.      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 Ronnald Ramp Chronic hoarseness, status post ENT eval ~ 2012 Normal DEXA 2012 HOH H/o Shingles  PLAN: DM: On Metformin, check A1c HTN: Ambulatory BPs are very good, continue amlodipine, Imdur, Micardis, check a CMP, CBC. Hyperlipidemia: On Pravachol, check FLP CAD: Saw cardiology last month, felt to be stable. AAA: Cardiology recommended observation given age COPD asthma: Wheezing a little today, overall at home is doing well with good compliance with Dulera and using the rescue inhaler less than 3 times a week.  See AVS. RTC 4 months   This visit occurred during the SARS-CoV-2 public health emergency.  Safety protocols were in place, including screening questions prior to the visit, additional usage of staff PPE, and extensive cleaning of exam room while observing appropriate contact time as indicated for disinfecting solutions.

## 2019-08-09 NOTE — Telephone Encounter (Signed)
Per Raquel Sarna, this was mailed on 08/08/2019

## 2019-08-09 NOTE — Assessment & Plan Note (Signed)
DM: On Metformin, check A1c HTN: Ambulatory BPs are very good, continue amlodipine, Imdur, Micardis, check a CMP, CBC. Hyperlipidemia: On Pravachol, check FLP CAD: Saw cardiology last month, felt to be stable. AAA: Cardiology recommended observation given age COPD asthma: Wheezing a little today, overall at home is doing well with good compliance with Dulera and using the rescue inhaler less than 3 times a week.  See AVS. RTC 4 months

## 2019-08-14 ENCOUNTER — Other Ambulatory Visit: Payer: Self-pay | Admitting: Internal Medicine

## 2019-08-21 ENCOUNTER — Ambulatory Visit: Payer: Self-pay

## 2019-08-21 NOTE — Telephone Encounter (Signed)
Not feeling well today. Onset of chills and fever 101.6. Feels weak. At 4 pm Tylenol, pt's wife gave, had trouble breathing which is worse than baseline (pt has COPD). Breathing is at baseline now.  During call temp rechecked 100.3. During HR 119- Oxygen level 89 - 91% No cough. Runny nose. - Advised pt's wife that sx and high risk pt requires 911 call to get evaluated ASAP. Called 911 and informed them of pt's name, address, phone number and symptoms and potential covid 19 infection. Advised wife that if his condition worsens to call 911 back as per dispatch instructions. Wife verbalized understanding.  Reason for Disposition . Patient sounds very sick or weak to the triager  Answer Assessment - Initial Assessment Questions 1. COVID-19 DIAGNOSIS: "Who made your Coronavirus (COVID-19) diagnosis?" "Was it confirmed by a positive lab test?" If not diagnosed by a HCP, ask "Are there lots of cases (community spread) where you live?" (See public health department website, if unsure)     N/a-no-yes 2. COVID-19 EXPOSURE: "Was there any known exposure to COVID before the symptoms began?" CDC Definition of close contact: within 6 feet (2 meters) for a total of 15 minutes or more over a 24-hour period.      no 3. ONSET: "When did the COVID-19 symptoms start?"      today 4. WORST SYMPTOM: "What is your worst symptom?" (e.g., cough, fever, shortness of breath, muscle aches)     weakness 5. COUGH: "Do you have a cough?" If so, ask: "How bad is the cough?"       no 6. FEVER: "Do you have a fever?" If so, ask: "What is your temperature, how was it measured, and when did it start?"     Yes- 101.6, oral today 7. RESPIRATORY STATUS: "Describe your breathing?" (e.g., shortness of breath, wheezing, unable to speak)      Trouble breathing SOB this am but is able to speak now and stated his breathing is at baseline. Pt's O2 sat 89-91%. Pt stated that it usually is 94%. Pt HR 119. Runny nose 8. BETTER-SAME-WORSE:  "Are you getting better, staying the same or getting worse compared to yesterday?"  If getting worse, ask, "In what way?"     worse 9. HIGH RISK DISEASE: "Do you have any chronic medical problems?" (e.g., asthma, heart or lung disease, weak immune system, obesity, etc.)     Lung and heart disease, COPD, diabetes 10. PREGNANCY: "Is there any chance you are pregnant?" "When was your last menstrual period?"       n/a 11. OTHER SYMPTOMS: "Do you have any other symptoms?"  (e.g., chills, fatigue, headache, loss of smell or taste, muscle pain, sore throat; new loss of smell or taste especially support the diagnosis of COVID-19)       Fatigue, weakness, runny nose, chills,  Protocols used: CORONAVIRUS (COVID-19) DIAGNOSED OR SUSPECTED-A-AH

## 2019-08-22 ENCOUNTER — Telehealth: Payer: Self-pay

## 2019-08-22 NOTE — Telephone Encounter (Signed)
Patient called [id number]

## 2019-08-22 NOTE — Telephone Encounter (Addendum)
If I understand correctly, yesterday he had fever chills and felt poorly, EMS arrived to home,He was checked but not transported to the ER and now he feels better. Please arrange a virtual visit for today, also arrange for a Covid test. If he has severe symptoms, ER.

## 2019-08-22 NOTE — Telephone Encounter (Signed)
Spoke with Patsy and she stated that patient is doing better. Temp this am 98.4 @ 1:30 am bp via EMT was elevated. When EMT left it was 142/74 O2 level 95 % HR 72. BP this am 128/64. She states patient feels good, denies any ha, sore throat, no nausea and no loss of taste or smell. Patient still feels weak not moving a lot today. Went to a [employer] and symptoms started.  Please Advise?

## 2019-08-22 NOTE — Telephone Encounter (Signed)
Copied from [place] [id number]. Topic: General - Inquiry >> Aug 22, 2019  8:32 AM Phillip Jordan, NT wrote: Reason for CRM: Pt's wife called in stating she called in yesterday and was advised to call EMT to assist husband. Pt's fever and BP have significantly lowered from yesterday and they would like to know the next steps to take, as pt did not want to go to hospital, as he got help with EMT.

## 2019-08-23 ENCOUNTER — Ambulatory Visit: Payer: PPO | Attending: Internal Medicine

## 2019-08-23 ENCOUNTER — Other Ambulatory Visit: Payer: Self-pay

## 2019-08-23 ENCOUNTER — Ambulatory Visit (INDEPENDENT_AMBULATORY_CARE_PROVIDER_SITE_OTHER): Payer: PPO | Admitting: Internal Medicine

## 2019-08-23 ENCOUNTER — Ambulatory Visit: Payer: PPO | Admitting: Podiatry

## 2019-08-23 ENCOUNTER — Encounter: Payer: Self-pay | Admitting: Internal Medicine

## 2019-08-23 DIAGNOSIS — B349 Viral infection, unspecified: Secondary | ICD-10-CM

## 2019-08-23 DIAGNOSIS — Z20828 Contact with and (suspected) exposure to other viral communicable diseases: Secondary | ICD-10-CM | POA: Diagnosis not present

## 2019-08-23 DIAGNOSIS — Z20822 Contact with and (suspected) exposure to covid-19: Secondary | ICD-10-CM

## 2019-08-23 NOTE — Telephone Encounter (Signed)
Please call patient, see previous instructions

## 2019-08-23 NOTE — Telephone Encounter (Signed)
Patient has appt today already and covid test done.

## 2019-08-23 NOTE — Progress Notes (Signed)
Subjective:    Patient ID: Phillip Jordan, male    DOB: 03/12/1930, 85 y.o.   MRN: [id number]  DOS:  08/23/2019 Type of visit - description: Virtual Visit via Video Note  I connected with the above patient  by a video enabled telemedicine application and verified that I am speaking with the correct person using two identifiers.   THIS ENCOUNTER IS A VIRTUAL VISIT DUE TO COVID-19 - PATIENT WAS NOT SEEN IN THE OFFICE. PATIENT HAS CONSENTED TO VIRTUAL VISIT / TELEMEDICINE VISIT   Location of patient: home  Location of provider: office  I discussed the limitations of evaluation and management by telemedicine and the availability of in person appointments. The patient expressed understanding and agreed to proceed.  History of Present Illness: Acute 2 days ago, he woke up with malaise, exhaustion, weakness, he had fever. EMS was called: BP 142/74, O2 sat 95%, temperature 101.7. He did not seek further medical attention. He felt poorly for 24 hours and since then he is better.  Currently with no fever, O2 sat 95%. Did have some nausea but that is better.  No vomiting or diarrhea No cough or chest pain No headache or unusual myalgias No sore throat    Review of Systems See above  Past Medical History:  Diagnosis Date  . Allergic rhinitis   . Aortic dissection ([place])    f/u by cards - AAA with distal dissection.  [name] CAD (coronary artery disease)    a. s/p MI 1992. b. Prior hx stenting to RCA/Cx. b. 05/2013: [place] s/p DES to distal RCA then staged DES to prox LAD.   [name] COPD with asthma ([place])   . Diabetes mellitus   . Hyperlipidemia   . Lichen planus    Right midline inferior chest, Dr. [name] Sensing  . Shingles     Past Surgical History:  Procedure Laterality Date  . BACK SURGERY  1993  . CARDIAC CATHETERIZATION     left, w/ coronary angiography and left ventriculograpy  . LEFT HEART CATHETERIZATION WITH CORONARY ANGIOGRAM N/A 06/09/2013   Procedure: LEFT HEART  CATHETERIZATION WITH CORONARY ANGIOGRAM;  Surgeon: [name], MD;  Location[facility] CATH LAB;  Service: Cardiovascular;  Laterality: N/A;  . PERCUTANEOUS CORONARY STENT INTERVENTION (PCI-S) N/A 06/12/2013   Procedure: PERCUTANEOUS CORONARY STENT INTERVENTION (PCI-S);  Surgeon: [name], MD;  Location: [facility] CATH LAB;  Service: Cardiovascular;  Laterality: N/A;  prox LAD  . PTCA     w/ placement of drugeluting stent in the distal right coronary artery, and distal Cardiologist: [name]    Social History   Socioeconomic History  . Marital status: Married    Spouse name: Not on file  . Number of children: 3  . Years of education: Not on file  . Highest education level: Not on file  Occupational History  . Occupation: retired  Tobacco Use  . Smoking status: Former Smoker    Packs/day: 1.50    Years: 39.00    Pack years: 58.50    Types: Cigarettes    Quit date: 08/24/1992    Years since quitting: 27.0  . Smokeless tobacco: Never Used  Substance and Sexual Activity  . Alcohol use: No  . Drug use: No  . Sexual activity: Not on file  Other Topics Concern  . Not on file  Social History Narrative   Lives w/ wife    3 kids, 35 Gk-GGK   Social Determinants of Health   Financial Resource Strain:   .  Difficulty of Paying Living Expenses: Not on file  Food Insecurity:   . Worried About Charity fundraiser in the Last Year: Not on file  . Ran Out of Food in the Last Year: Not on file  Transportation Needs:   . Lack of Transportation (Medical): Not on file  . Lack of Transportation (Non-Medical): Not on file  Physical Activity:   . Days of Exercise per Week: Not on file  . Minutes of Exercise per Session: Not on file  Stress:   . Feeling of Stress : Not on file  Social Connections:   . Frequency of Communication with Friends and Family: Not on file  . Frequency of Social Gatherings with Friends and Family: Not on file  . Attends Religious Services: Not on file   . Active Member of Clubs or Organizations: Not on file  . Attends Archivist Meetings: Not on file  . Marital Status: Not on file  Intimate Partner Violence:   . Fear of Current or Ex-Partner: Not on file  . Emotionally Abused: Not on file  . Physically Abused: Not on file  . Sexually Abused: Not on file      Allergies as of 08/23/2019   No Known Allergies     Medication List       Accurate as of August 23, 2019  2:38 PM. If you have any questions, ask your nurse or doctor.        Acapella Misc Use as directed   albuterol 108 (90 Base) MCG/ACT inhaler Commonly known as: VENTOLIN HFA Inhale 2 puffs into the lungs every 4 (four) hours as needed for wheezing or shortness of breath.   amLODipine 2.5 MG tablet Commonly known as: NORVASC Take 1 tablet (2.5 mg total) by mouth daily.   aspirin 81 MG tablet Take 1 tablet (81 mg total) by mouth daily.   azelastine 0.1 % nasal spray Commonly known as: ASTELIN Place 2 sprays into both nostrils at bedtime as needed for rhinitis. Use in each nostril as directed   clopidogrel 75 MG tablet Commonly known as: PLAVIX Take 1 tablet (75 mg total) by mouth daily.   famotidine 20 MG tablet Commonly known as: PEPCID TAKE 1 TABLET BY MOUTH AT BEDTIME   fluticasone 50 MCG/ACT nasal spray Commonly known as: FLONASE Place 2 sprays into both nostrils daily as needed for allergies or rhinitis.   glucose blood test strip Commonly known as: FREESTYLE TEST STRIPS Check blood sugar no more than twice daily   isosorbide mononitrate 30 MG 24 hr tablet Commonly known as: IMDUR Take 1 tablet (30 mg total) by mouth 2 (two) times daily.   meclizine 12.5 MG tablet Commonly known as: ANTIVERT Take 1 tablet (12.5 mg total) 3 (three) times daily as needed by mouth for dizziness.   metFORMIN 500 MG tablet Commonly known as: GLUCOPHAGE Take 1 tablet (500 mg total) by mouth daily with breakfast.   mometasone-formoterol 200-5  MCG/ACT Aero Commonly known as: DULERA Inhale 2 puffs into the lungs 2 (two) times daily.   multivitamin with minerals Tabs tablet Take 1 tablet by mouth daily.   nitroGLYCERIN 0.4 MG SL tablet Commonly known as: NITROSTAT Place 1 tablet (0.4 mg total) under the tongue every 5 (five) minutes as needed for chest pain (up to 3 doses only.).   pravastatin 40 MG tablet Commonly known as: PRAVACHOL Take 1 tablet (40 mg total) by mouth daily.   telmisartan 20 MG tablet Commonly known as: MICARDIS  Take 1 tablet (20 mg total) by mouth daily.           Objective:   Physical Exam There were no vitals taken for this visit. This is a virtual video visit, he looks well, alert oriented x3, he seems to have a runny nose.     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 Ronnald Ramp Chronic hoarseness, status post ENT eval ~ 2012 Normal DEXA 2012 HOH H/o Shingles  PLAN: Viral syndrome: Phillip Jordan have a viral syndrome 2 days ago, I am concerned about this being Covid but fortunately he seems to be doing well. He got tested for Covid today, results  pending. Plan: Rest, good hydration, monitor BPs, temperature and O2 sat.  He has COPD, O2 sat ranged from 94-97 at baseline.  If the O2 sat started dropping to 92% or less he needs to either call us or go to the ER at this is a early sign that things are going in the wrong direction. He asked me about treatment for Covid, other than what is described above he could benefit for monoclonal antibodies but that is not something I can prescribe at this point. Again the most important message is to seek medical attention if he start having symptoms again.   I discussed the assessment and treatment plan with the  patient. The patient was provided an opportunity to ask questions and all were answered. The patient agreed with the plan and demonstrated an understanding of the instructions.   The patient was advised to call back or seek an in-person evaluation if the symptoms worsen or if the condition fails to improve as anticipated.

## 2019-08-24 ENCOUNTER — Telehealth: Payer: Self-pay | Admitting: Internal Medicine

## 2019-08-24 LAB — NOVEL CORONAVIRUS, NAA: SARS-CoV-2, NAA: NOT DETECTED

## 2019-08-24 NOTE — Telephone Encounter (Signed)
ATC pt's wife, received a fast busy signal x2. Will try back.

## 2019-08-26 NOTE — Assessment & Plan Note (Signed)
Viral syndrome: Phillip Jordan have a viral syndrome 2 days ago, I am concerned about this being Covid but fortunately he seems to be doing well. He got tested for Covid today, results  pending. Plan: Rest, good hydration, monitor BPs, temperature and O2 sat.  He has COPD, O2 sat ranged from 94-97 at baseline.  If the O2 sat started dropping to 92% or less he needs to either call us or go to the ER at this is a early sign that things are going in the wrong direction. He asked me about treatment for Covid, other than what is described above he could benefit for monoclonal antibodies but that is not something I can prescribe at this point. Again the most important message is to seek medical attention if he start having symptoms again.

## 2019-08-28 ENCOUNTER — Encounter: Payer: Self-pay | Admitting: Internal Medicine

## 2019-08-28 ENCOUNTER — Ambulatory Visit (INDEPENDENT_AMBULATORY_CARE_PROVIDER_SITE_OTHER): Payer: PPO | Admitting: Internal Medicine

## 2019-08-28 ENCOUNTER — Telehealth: Payer: Self-pay | Admitting: *Deleted

## 2019-08-28 DIAGNOSIS — J441 Chronic obstructive pulmonary disease with (acute) exacerbation: Secondary | ICD-10-CM | POA: Diagnosis not present

## 2019-08-28 DIAGNOSIS — E11319 Type 2 diabetes mellitus with unspecified diabetic retinopathy without macular edema: Secondary | ICD-10-CM

## 2019-08-28 DIAGNOSIS — I7101 Dissection of thoracic aorta: Secondary | ICD-10-CM | POA: Diagnosis not present

## 2019-08-28 DIAGNOSIS — I71019 Dissection of thoracic aorta, unspecified: Secondary | ICD-10-CM

## 2019-08-28 MED ORDER — PREDNISONE 10 MG PO TABS: ORAL_TABLET | ORAL | 0 refills | Status: DC

## 2019-08-28 MED ORDER — MOMETASONE FURO-FORMOTEROL FUM 100-5 MCG/ACT IN AERO: 2.0000 | INHALATION_SPRAY | Freq: Two times a day (BID) | RESPIRATORY_TRACT | 11 refills | Status: DC

## 2019-08-28 NOTE — Telephone Encounter (Signed)
I spoke with pt's wife, Tessie Fass and she states pt has a blister on the foot and swelling, but no redness. I told Patsy, that Dr. Prudence Davidson would not be able to see pt, another doctor would be able to see pt. I told Patsy that if pt had red streaks or fever, she should take him to ED. Patsy states pt had fever about 10 days ago, but had a negative COVID test and hasn't been around anyone. Transferred to schedulers.

## 2019-08-28 NOTE — Telephone Encounter (Signed)
Pt's wife called states pt missed his appt due to illness and the foot and ankle is red and swollen.

## 2019-08-28 NOTE — Patient Instructions (Addendum)
Prednisone 10 mg take  4 each am x 2 days,   2 each am x 2 days,  1 each am x 2 days and stop   No change on your other medications   Take the covid 19 vaccine as soon as it is offered.  Please schedule a follow up visit in 3 months but call sooner if needed

## 2019-08-28 NOTE — Telephone Encounter (Signed)
Called and spoke with pt's wife Patsy letting her know that we did not have any samples of Dulera. She said to send an Rx for the lower dose of dulera to pharmacy for pt. I have sent Dulera 100 to pharmacy  While speaking with Patsy, she stated that pt was sick last week running a fever and was tested for covid. covid test was negative. Patsy stated that pt has been feeling fatigued, more weak, coughing occ, and feeling like he is not getting much relief from rescue inhaler. Stated to [facility] that we should get pt scheduled for a televisit with MW to further evaluate and she verbalized understanding. Scheduled pt for televisit with MW today at 11am. Nothing further needed.

## 2019-08-28 NOTE — Assessment & Plan Note (Signed)
Quit smoking 1990s - PFT's 07/02/11  FEV1  1.41 (55%) ratio 66 and 12% better p B2 with DLCO 96% - PFTs  07/05/2013 FEV1  1.05 and 1.31 p B2 (24%) with DLCO 59 corrects to 84%  - Spiriva and other mdi result in hoarseness - hfa 05/25/2013 75% p coaching>  90% 07/05/13  - added gerd rx 05/25/2013 >> improved 07/05/13  - rx dulera 200 2bid 07/05/13 > better 10/03/2013   - 10/08/2015 added GERD rx back due to pm strangling/ hoarseness  - 10/25/2015 added flutter - Rehab completed Aug 2017  - PFT's  05/21/2016  FEV1 1.23 (50 % ) ratio 45  p 32 % improvement from saba p dulera 200 prior to study with DLCO  56/57 % corrects to 72  % for alv volume   - 11/30/2016  After extensive coaching HFA effectiveness =    90%    Flare x 07/09/16 ? vrial uri > rx zpak/pred x6  - flare 01/15/2017  rx pred only  - flare 08/21/2019 rx prednisone x 6 days only   At baseline well compensated but acutely worse since " non-covid"  URI x one week no longer with fever and no purulent sputum or ST so rec Prednisone 10 mg take  4 each am x 2 days,   2 each am x 2 days,  1 each am x 2 days and stop    Pt informed of the seriousness of COVID 19 infection as a direct risk to lung health  and safey and to close contacts and should continue to wear a facemask in public and minimize exposure to public locations but especially avoid any area or activity where non-close contacts are not observing distancing or wearing an appropriate face mask.  I strongly recommended vaccine when offered.     >>> f/u in 3 months, call sooner if needed   Each maintenance medication was reviewed in detail including most importantly the difference between maintenance and as needed and under what circumstances the prns are to be used.  Please see AVS for specific  Instructions which are unique to this visit and I personally typed out  which were reviewed in detail over the phone with the patient and a copy provided via MyChart

## 2019-08-28 NOTE — Progress Notes (Signed)
Subjective:     Patient ID: Phillip Jordan, male    DOB: [birth year]   MRN: [id number]    Brief patient profile:  86  year old white  male, ex-smoker (1ppd x 30 years, quit 1990s), diagnosed with COPD per hx by Dr [name] (fev1 1.38L/44%, ratio 51 in Jan 2009). C/w GOLD III  Criteria    History of Present Illness  05/25/2013 consult/ [name] re: sob/ prev eval MR with GOLD III COPD Chief Complaint  Patient presents with  . Pulmonary Consult    Referred per Dr. Kathlene November. The pt c/o DOE for the past 15 years, but worse for the past 6 months. He states that he gets SOB with walking up hills, but does okay with his exercise program 2-3 times per wk. He also c/o hoarseness and cough "for a long time"- cough is prod in the am with minimal clear sputum.      All his days are about the same in terms of activity tolerance  Breathing got better p quit smoking then worse again starting around mid 1990s Better on dulera, no better on spiriva ? Worse hoarseness so stopped it Work on inhaler technique:    Pantoprazole (protonix) 40 mg   Take 30-60 min before first meal of the day and Pepcid 20 mg one bedtime until return to office - this is the best way to tell whether stomach acid is contributing to your problem.  GERD diet    03/12/2017  f/u ov/Aiyanah Kalama re:   COPD  III dulera 200 / singulair/ gerd rx  Chief Complaint  Patient presents with  . Follow-up    Breathing has improved back to his normal baseline. No new co's. He has not had to use rescue inhaler.    doe still = mmrc3  rec Plan A = Automatic = Dulera 200 Take 2 puffs first thing in am and then another 2 puffs about 12 hours later Also  Pantoprazole (protonix) 40 mg   Take  30-60 min before first meal of the day and Pepcid (famotidine)  20 mg one @  bedtime until return to office - this is the best way to tell whether stomach acid is contributing to your problem.   Plan B = Backup Only use your albuterol as a rescue medication to be used if  you can't catch your breath by resting or doing a relaxed purse lip breathing pattern.  - The less you use it, the better it will work when you need it. - Ok to use the inhaler up to 2 puffs  every 4 hours if you must but call for appointment if use goes up over your usual need - Don't leave home without it !!  (think of it like the spare tire for your car)  Whenever coughing / wheezing/ short of breath >  Take protonix Take 30- 60 min before your first and last meals of the day and once you are better reduce back to protonix 40 mg Take 30-60 min before first meal of the day  Plan C= Crisis If needing the albuterol (proair) more than a few times a week> Prednisone 10 mg take  4 each am x 2 days,   2 each am x 2 days,  1 each am x 2 days and stop     06/14/2017  f/u ov/Vincenza Dail re: copd III on dulera 200/ singulair/ gerd rx/ pred prn used x one cycle since last ov Chief Complaint  Patient presents with  .  Follow-up    He states his breathing is doing well. He rarely uses his albuterol inhaler.    doe improved to  [facility] = can't walk a nl pace on a flat grade s sob but does fine slow and flat eg does fine walking slow anywhere and also [facility]  Sleeps ok  Watery rhinitis worse p meals / not sure singulair helping rec Ok to try off singulair to see if worse nasal or lower respiratory symptoms and if so restart     06/15/2018  f/u ov/Edeline Greening re:  Copd II/ maint rx = dulera 200 2bid / no worse off singulair,  Main concern is cost of care  Chief Complaint  Patient presents with  . Follow-up    Breathing is overall doig well. He states he has been wheezing some for approx 1 wk. He is using his albuterol once daily on average.   Dyspnea:  Works out 3 x weekly on treadmill 20 min x 17mph at 2 degrees  Cough: min/ clear mucus  no am flare despite runny nose x one week prior to [place]: ok flat bed/ one pillow  SABA use: once a day most - has not used contingency prednisone in  past 6 m       03/17/2019  f/u ov/Herberth Deharo re: copd II/ maint on dulera 200 and pepcid hs for ? Noct gerd  Chief Complaint  Patient presents with  . Follow-up    Breathing is overall doing well but he is not exercising much. He uses his rescue inhaler about once every other wk.   Dyspnea:  Gym closed / some walking in the street, did not do  as well in [place] but still did 45 min s stopping, slow pace = MMRC2 = can't walk a nl pace on a flat grade s sob but does fine slow and flat  Cough: no change am cough/ congesiton >min mucoid sputum Sleeping: bed flat/ one pillow  SABA use: rare  rec Ok to try off the famotidine to see if it makes any difference with your congestion in am after you try  the bed blocks for a month   GERD diet   05/11/2019  f/u ov/Allayah Raineri re:  Copd GOLD II/ maint on dulera 200   doing better noct and in am since bed blocks and pepcid at hs (never stopped)  Chief Complaint  Patient presents with  . Follow-up    had round of pred 2 wks ago- SOB and hoarseness has improved. He is using bed blocks and this has helped.    Dyspnea:  MMRC2 = can't walk a nl pace on a flat grade s sob but does fine slow and flat  Cough: better, less congestion esp in am  Sleeping: ok on bed blocks  SABA use: rarely saba  02: no rec No change in medications although ok to try off the famotidine at bedtime to see if affects the am congestion     Acute Virtual Visit via Telephone Note 08/28/2019   I connected with Phillip Jordan on 08/28/19 at 10:45  AM EST by telephone and verified that I am speaking with the correct person using two identifiers.   I discussed the limitations, risks, security and privacy concerns of performing an evaluation and management service by telephone and the availability of in person appointments. I also discussed with the patient that there may be a patient responsible charge related to this service. The  patient expressed understanding and agreed to  proceed.   History of Present Illness: aecopd GOLD II/III baseline Dyspnea: worse since 08/21/19 assoc temp 101 with cough/ congestion so went for covid test 08/23/19 and neg and fever resolved p advil  Cough: clear mucus no fever or st since first days / no am cough/ congestion off h2 Sleeping: ok on bed blocks SABA use: rarely / not helping while on dulera 1200 2bid  02: none   No obvious day to day or daytime variability or assoc excess/ purulent sputum or mucus plugs or hemoptysis or cp or chest tightness, subjective wheeze or overt sinus or hb symptoms.    Also denies any obvious fluctuation of symptoms with weather or environmental changes or other aggravating or alleviating factors except as outlined above.   Meds reviewed/ med reconciliation completed      Observations/Objective: Hoarse with voice fatigue but no conversational sob/ cough is more of a dry rattle.    Assessment and Plan: See problem list for active a/p's   Follow Up Instructions: See avs for instructions unique to this ov which includes revised/ updated med list   Outpatient Encounter Medications as of 08/28/2019  Medication Sig  . albuterol (VENTOLIN HFA) 108 (90 Base) MCG/ACT inhaler Inhale 2 puffs into the lungs every 4 (four) hours as needed for wheezing or shortness of breath.  [name] amLODipine (NORVASC) 2.5 MG tablet Take 1 tablet (2.5 mg total) by mouth daily.  [name] aspirin 81 MG tablet Take 1 tablet (81 mg total) by mouth daily.  [name] azelastine (ASTELIN) 0.1 % nasal spray Place 2 sprays into both nostrils at bedtime as needed for rhinitis. Use in each nostril as directed  . clopidogrel (PLAVIX) 75 MG tablet Take 1 tablet (75 mg total) by mouth daily.  . fluticasone (FLONASE) 50 MCG/ACT nasal spray Place 2 sprays into both nostrils daily as needed for allergies or rhinitis.  [name] glucose blood (FREESTYLE TEST STRIPS) test strip Check blood sugar no more than twice daily  . isosorbide mononitrate (IMDUR) 30 MG 24  hr tablet Take 1 tablet (30 mg total) by mouth 2 (two) times daily.  . meclizine (ANTIVERT) 12.5 MG tablet Take 1 tablet (12.5 mg total) 3 (three) times daily as needed by mouth for dizziness.  . metFORMIN (GLUCOPHAGE) 500 MG tablet Take 1 tablet (500 mg total) by mouth daily with breakfast.  . Misc. Devices (ACAPELLA) MISC Use as directed  . mometasone-formoterol (DULERA) 100-5 MCG/ACT AERO Inhale 2 puffs into the lungs 2 (two) times daily.  . Multiple Vitamin (MULTIVITAMIN WITH MINERALS) TABS tablet Take 1 tablet by mouth daily.  . nitroGLYCERIN (NITROSTAT) 0.4 MG SL tablet Place 1 tablet (0.4 mg total) under the tongue every 5 (five) minutes as needed for chest pain (up to 3 doses only.).  [name] pravastatin (PRAVACHOL) 40 MG tablet Take 1 tablet (40 mg total) by mouth daily.  . predniSONE (DELTASONE) 10 MG tablet Take  4 each am x 2 days,   2 each am x 2 days,  1 each am x 2 days and stop  . telmisartan (MICARDIS) 20 MG tablet Take 1 tablet (20 mg total) by mouth daily.  .      No facility-administered encounter medications on file as of 08/28/2019.    I discussed the assessment and treatment plan with the patient. The patient was provided an opportunity to ask questions and all were answered. The patient agreed with the plan and demonstrated an understanding of the instructions.  The patient was advised to call back or seek an in-person evaluation if the symptoms worsen or if the condition fails to improve as anticipated.  I provided 25 minutes of non-face-to-face time during this encounter.   Christinia Gully, MD

## 2019-08-30 ENCOUNTER — Ambulatory Visit (INDEPENDENT_AMBULATORY_CARE_PROVIDER_SITE_OTHER): Payer: PPO

## 2019-08-30 ENCOUNTER — Ambulatory Visit: Payer: PPO | Admitting: Podiatry

## 2019-08-30 ENCOUNTER — Other Ambulatory Visit: Payer: Self-pay

## 2019-08-30 ENCOUNTER — Encounter: Payer: Self-pay | Admitting: Podiatry

## 2019-08-30 VITALS — Temp 97.5°F

## 2019-08-30 DIAGNOSIS — M109 Gout, unspecified: Secondary | ICD-10-CM

## 2019-08-30 DIAGNOSIS — L97512 Non-pressure chronic ulcer of other part of right foot with fat layer exposed: Secondary | ICD-10-CM | POA: Diagnosis not present

## 2019-08-30 DIAGNOSIS — E0843 Diabetes mellitus due to underlying condition with diabetic autonomic (poly)neuropathy: Secondary | ICD-10-CM

## 2019-08-30 DIAGNOSIS — L03115 Cellulitis of right lower limb: Secondary | ICD-10-CM

## 2019-08-30 MED ORDER — DOXYCYCLINE HYCLATE 100 MG PO TABS: 100.0000 mg | ORAL_TABLET | Freq: Two times a day (BID) | ORAL | 0 refills | Status: DC

## 2019-08-30 MED ORDER — GENTAMICIN SULFATE 0.1 % EX CREA: 1.0000 "application " | TOPICAL_CREAM | Freq: Two times a day (BID) | CUTANEOUS | 1 refills | Status: DC

## 2019-08-31 ENCOUNTER — Telehealth: Payer: Self-pay | Admitting: *Deleted

## 2019-08-31 NOTE — Telephone Encounter (Signed)
Left message informing pt's wife, [name], the dressing should remain in place and he could take a shower if he could keep the dressing dry and I would call again with instructions.

## 2019-08-31 NOTE — Telephone Encounter (Signed)
Pt's wife, [name] states pt had procedure with Dr. Amalia Hailey yesterday and would like to know how to care for it and when he can shower.

## 2019-09-03 LAB — WOUND CULTURE
MICRO NUMBER:: 10013767
SPECIMEN QUALITY:: ADEQUATE

## 2019-09-03 NOTE — Progress Notes (Signed)
   Subjective:  86 y.o. male with PMHx of diabetes mellitus with a chief complaint of a blister to the plantar aspect of the right foot that has been present for the past ten days. He reports associated swelling, erythema, burning and throbbing pain. He states he had a fever last week that has since resolved. He reports a nodule to the posterior calf as well as swelling of the leg. He states his pulmonologist, Dr. [name], recently put him on Prednisone for treatment of COPD. He has not had any treatment for his complaint. Patient is here for further evaluation and treatment.   Past Medical History:  Diagnosis Date  . Allergic rhinitis   . Aortic dissection ([place])    f/u by cards - AAA with distal dissection.  [name] CAD (coronary artery disease)    a. s/p MI 1992. b. Prior hx stenting to RCA/Cx. b. 05/2013: [place] s/p DES to distal RCA then staged DES to prox LAD.   [name] COPD with asthma ([place])   . Diabetes mellitus   . Hyperlipidemia   . Lichen planus    Right midline inferior chest, Dr. [name] Sensing  . Shingles       Objective/Physical Exam General: The patient is alert and oriented x3 in no acute distress.  Dermatology:  Wound #1 noted to the sub-first MPJ of the right foot measuring approximately 0.5 x 0.5 x 0.2 cm (LxWxD).   To the noted ulceration(s), there is no eschar. There is a moderate amount of slough, fibrin, and necrotic tissue noted. Granulation tissue and wound base is red. There is a minimal amount of serosanguineous drainage noted. There is no exposed bone muscle-tendon ligament or joint. There is no malodor. Periwound integrity is intact. Erythema and edema noted to the right foot and leg.  Skin is warm, dry and supple bilateral lower extremities.  Vascular: Palpable pedal pulses bilaterally. Capillary refill within normal limits.  Neurological: Epicritic and protective threshold diminished bilaterally.   Musculoskeletal Exam: Range of motion within normal limits to all pedal  and ankle joints bilateral. Muscle strength 5/5 in all groups bilateral.   Radiographic Exam:  Normal osseous mineralization. Joint spaces preserved. No fracture/dislocation/boney destruction.    Assessment: 1. Ulceration of the right sub-first MPJ secondary to diabetes mellitus 2. Cellulitis right lower extremity 3. diabetes mellitus w/ peripheral neuropathy   Plan of Care:  1. Patient was evaluated. X-Rays reviewed.  2. medically necessary excisional debridement including subcutaneous tissue was performed using a tissue nipper and a chisel blade. Excisional debridement of all the necrotic nonviable tissue down to healthy bleeding viable tissue was performed with post-debridement measurements same as pre-. 3. the wound was cleansed and dry sterile dressing applied. 4. Prescription for Doxycycline 100 mg #20 provided to patient.  5. Prescription for Gentamicin cream provided to patient to use daily with a bandage.  6. Culture taken from wound.  7. patient is to return to clinic in one weeks.   [name], DPM [facility]  Dr. [name], [address]                                        [place], [place] [address]                Office [phone]  Fax [phone]

## 2019-09-06 ENCOUNTER — Other Ambulatory Visit: Payer: Self-pay

## 2019-09-06 ENCOUNTER — Ambulatory Visit: Payer: PPO | Admitting: Podiatry

## 2019-09-06 DIAGNOSIS — E0843 Diabetes mellitus due to underlying condition with diabetic autonomic (poly)neuropathy: Secondary | ICD-10-CM | POA: Diagnosis not present

## 2019-09-06 DIAGNOSIS — L97512 Non-pressure chronic ulcer of other part of right foot with fat layer exposed: Secondary | ICD-10-CM | POA: Diagnosis not present

## 2019-09-10 NOTE — Progress Notes (Signed)
Instructions  Subjective:  86 y.o. male with PMHx of diabetes mellitus for follow up evaluation of an ulceration of the right sub-first MPJ. He states the redness and swelling have subsided and he is doing much better. He has been taking Doxycycline and using Gentamicin cream as directed. There are no worsening factors noted. Patient is here for further evaluation and treatment.   Past Medical History:  Diagnosis Date  . Allergic rhinitis   . Aortic dissection ([place])    f/u by cards - AAA with distal dissection.  [name] CAD (coronary artery disease)    a. s/p MI 1992. b. Prior hx stenting to RCA/Cx. b. 05/2013: [place] s/p DES to distal RCA then staged DES to prox LAD.   [name] COPD with asthma ([place])   . Diabetes mellitus   . Hyperlipidemia   . Lichen planus    Right midline inferior chest, Dr. [name] Sensing  . Shingles       Objective/Physical Exam General: The patient is alert and oriented x3 in no acute distress.  Dermatology:  Wound #1 noted to the sub-first MPJ of the right foot measuring approximately 0.5 x 0.1 x 0.1 cm (LxWxD).   To the noted ulceration(s), there is no eschar. There is a moderate amount of slough, fibrin, and necrotic tissue noted. Granulation tissue and wound base is red. There is a minimal amount of serosanguineous drainage noted. There is no exposed bone muscle-tendon ligament or joint. There is no malodor. Periwound integrity is intact. Skin is warm, dry and supple bilateral lower extremities.  Vascular: Palpable pedal pulses bilaterally. Capillary refill within normal limits.  Neurological: Epicritic and protective threshold diminished bilaterally.   Musculoskeletal Exam: Range of motion within normal limits to all pedal and ankle joints bilateral. Muscle strength 5/5 in all groups bilateral.   Assessment: 1. Ulceration of the right sub-first MPJ secondary to diabetes mellitus 2. Cellulitis right lower extremity - resolved  3. diabetes mellitus w/ peripheral  neuropathy   Plan of Care:  1. Patient was evaluated.  2. medically necessary excisional debridement including subcutaneous tissue was performed using a tissue nipper and a chisel blade. Excisional debridement of all the necrotic nonviable tissue down to healthy bleeding viable tissue was performed with post-debridement measurements same as pre-. 3. the wound was cleansed and dry sterile dressing applied. 4. Continue using Gentamicin cream daily with a bandage.  5. Offloading met pads applied to shoes.  6. Return to clinic in 2 weeks.    [name], DPM [facility]  Dr. [name], [address]                                        [place], [place] [address]                Office [phone]  Fax [phone]

## 2019-09-18 ENCOUNTER — Ambulatory Visit: Payer: PPO | Admitting: Internal Medicine

## 2019-09-18 ENCOUNTER — Ambulatory Visit: Payer: PPO | Admitting: Podiatry

## 2019-09-25 ENCOUNTER — Other Ambulatory Visit: Payer: Self-pay

## 2019-09-25 ENCOUNTER — Ambulatory Visit: Payer: PPO | Admitting: Podiatry

## 2019-09-25 DIAGNOSIS — E0843 Diabetes mellitus due to underlying condition with diabetic autonomic (poly)neuropathy: Secondary | ICD-10-CM

## 2019-09-25 DIAGNOSIS — L97512 Non-pressure chronic ulcer of other part of right foot with fat layer exposed: Secondary | ICD-10-CM | POA: Diagnosis not present

## 2019-09-26 ENCOUNTER — Other Ambulatory Visit: Payer: Self-pay | Admitting: Internal Medicine

## 2019-09-29 NOTE — Progress Notes (Signed)
   HPI: 86 y.o. male presenting today for follow up evaluation of the right sub-first MPJ. He reports he is doing well and improving significantly. He denies any pain or worsening factors. He has been using Gentamicin cream and offloading met pads as instructed which have both helped alleviate his symptoms. Patient is here for further evaluation and treatment.   Past Medical History:  Diagnosis Date  . Allergic rhinitis   . Aortic dissection ([place])    f/u by cards - AAA with distal dissection.  [name] CAD (coronary artery disease)    a. s/p MI 1992. b. Prior hx stenting to RCA/Cx. b. 05/2013: [place] s/p DES to distal RCA then staged DES to prox LAD.   [name] COPD with asthma ([place])   . Diabetes mellitus   . Hyperlipidemia   . Lichen planus    Right midline inferior chest, Dr. [name] Sensing  . Shingles      Physical Exam: General: The patient is alert and oriented x3 in no acute distress.  Dermatology: Wound noted to the right sub-first MPJ has healed. Complete re-epithelialization has occurred. No drainage noted.  Skin is warm, dry and supple bilateral lower extremities. Negative for open lesions or macerations.  Vascular: Palpable pedal pulses bilaterally. No edema or erythema noted. Capillary refill within normal limits.  Neurological: Epicritic and protective threshold diminished bilaterally.   Musculoskeletal Exam: Range of motion within normal limits to all pedal and ankle joints bilateral. Muscle strength 5/5 in all groups bilateral.   Assessment: 1. Ulceration of the right sub-first MPJ secondary to diabetes mellitus - healed  2. Diabetes mellitus w/ peripheral neuropathy   Plan of Care:  1. Patient evaluated.   2. Continue using offloading felt dancer's pads.  3. Recommended good shoe gear.  4. Return to clinic as needed.       [name], DPM [facility]  Dr. [name], DPM    [address], [place]  [address]                Office [phone]  Fax [phone]

## 2019-10-06 ENCOUNTER — Telehealth: Payer: Self-pay | Admitting: Internal Medicine

## 2019-10-06 NOTE — Progress Notes (Signed)
  Chronic Care Management   Note  10/06/2019 Name: Phillip Jordan MRN: [id number] DOB: 11/20/1929  [name] is a 86 y.o. year old male who is a primary care patient of Colon Branch, MD. I reached out to Archie Balboa by phone today in response to a referral sent by [name] PCP, Colon Branch, MD.   [name] was given information about Chronic Care Management services today including:  1. CCM service includes personalized support from designated clinical staff supervised by his physician, including individualized plan of care and coordination with other care providers 2. 24/7 contact phone numbers for assistance for urgent and routine care needs. 3. Service will only be billed when office clinical staff spend 20 minutes or more in a month to coordinate care. 4. Only one practitioner may furnish and bill the service in a calendar month. 5. The patient may stop CCM services at any time (effective at the end of the month) by phone call to the office staff. 6. The patient will be responsible for cost sharing (co-pay) of up to 20% of the service fee (after annual deductible is met).  Patient did not agree to services and wishes to consider information provided before deciding about enrollment in care management services.   Follow up plan:   Raynicia Dukes [employer] Scheduler

## 2019-10-06 NOTE — Chronic Care Management (AMB) (Signed)
  Chronic Care Management   Note  10/06/2019 Name: Phillip Jordan MRN: [id number] DOB: 02/14/1930  Phillip Jordan is a 86 y.o. year old male who is a primary care patient of Colon Branch, MD. I reached out to Phillip Jordan by phone today in response to a referral sent by Phillip Jordan PCP, Colon Branch, MD.   Phillip Jordan was given information about Chronic Care Management services today including:  1. CCM service includes personalized support from designated clinical staff supervised by his physician, including individualized plan of care and coordination with other care providers 2. 24/7 contact phone numbers for assistance for urgent and routine care needs. 3. Service will only be billed when office clinical staff spend 20 minutes or more in a month to coordinate care. 4. Only one practitioner may furnish and bill the service in a calendar month. 5. The patient may stop CCM services at any time (effective at the end of the month) by phone call to the office staff. 6. The patient will be responsible for cost sharing (co-pay) of up to 20% of the service fee (after annual deductible is met).  Patient agreed to services and verbal consent obtained.   Follow up plan:   Raynicia Dukes [employer] Scheduler

## 2019-10-09 ENCOUNTER — Encounter: Payer: Self-pay | Admitting: Internal Medicine

## 2019-10-09 DIAGNOSIS — C4442 Squamous cell carcinoma of skin of scalp and neck: Secondary | ICD-10-CM | POA: Diagnosis not present

## 2019-10-09 DIAGNOSIS — D485 Neoplasm of uncertain behavior of skin: Secondary | ICD-10-CM | POA: Diagnosis not present

## 2019-10-09 DIAGNOSIS — L570 Actinic keratosis: Secondary | ICD-10-CM | POA: Diagnosis not present

## 2019-10-09 DIAGNOSIS — Z85828 Personal history of other malignant neoplasm of skin: Secondary | ICD-10-CM | POA: Diagnosis not present

## 2019-10-09 DIAGNOSIS — D044 Carcinoma in situ of skin of scalp and neck: Secondary | ICD-10-CM | POA: Diagnosis not present

## 2019-10-10 ENCOUNTER — Other Ambulatory Visit: Payer: Self-pay | Admitting: Internal Medicine

## 2019-10-16 DIAGNOSIS — C4492 Squamous cell carcinoma of skin, unspecified: Secondary | ICD-10-CM | POA: Insufficient documentation

## 2019-10-18 ENCOUNTER — Other Ambulatory Visit: Payer: Self-pay

## 2019-10-18 ENCOUNTER — Ambulatory Visit: Payer: PPO | Admitting: Pharmacist

## 2019-10-18 DIAGNOSIS — I10 Essential (primary) hypertension: Secondary | ICD-10-CM

## 2019-10-18 DIAGNOSIS — E785 Hyperlipidemia, unspecified: Secondary | ICD-10-CM

## 2019-10-18 DIAGNOSIS — I2510 Atherosclerotic heart disease of native coronary artery without angina pectoris: Secondary | ICD-10-CM

## 2019-10-18 DIAGNOSIS — J449 Chronic obstructive pulmonary disease, unspecified: Secondary | ICD-10-CM

## 2019-10-18 DIAGNOSIS — R42 Dizziness and giddiness: Secondary | ICD-10-CM

## 2019-10-18 DIAGNOSIS — E11319 Type 2 diabetes mellitus with unspecified diabetic retinopathy without macular edema: Secondary | ICD-10-CM

## 2019-10-18 DIAGNOSIS — J309 Allergic rhinitis, unspecified: Secondary | ICD-10-CM

## 2019-10-18 NOTE — Chronic Care Management (AMB) (Signed)
Chronic Care Management Pharmacy  Name: KEVIS QU  MRN: [id number] DOB: 27-Nov-1929  Chief Complaint/ HPI  Archie Balboa,  86 y.o. , male presents for their Initial CCM visit with the clinical pharmacist via telephone due to COVID-19 Pandemic.  PCP : Colon Branch, MD  Their chronic conditions include: COPD, DM, HTN, HLD, CAD, Vertigo  Office Visits: 08/23/19: Virtual visit w/ Dr. [name] - Viral syndrome. Pt woke up with malaise, exhaustion, weakness, and fever. Did not seek medical attention. Resolved after 24 hours. Covid tested. Advised seeking medical attention if symptoms recur.   08/09/19: Office visit w/ Dr. [name]. Labs ordered (CMP, CBC, a1c, lipid panel). Advised to use Dulera BID and if using rescue inhaler more than 3-4 times per week to report to Dr. [name] or Dr. [name].   05/26/19: Office visit w/ Dr. Nani Ravens - Penile discharge w/ increased frequency. Tx of UTI empirically. UA given at later date. RTC in 1 week if no improvement and consider BPH tx at that time  Consult Visit: 09/25/19: Podiatry office visit w/ Dr.Evans -  Pt using gentamicin cream and pads. Wound noted to the right sub first MPJ has healed with complete re-epithelialization and no drainage. Continue using offloading felt dancer's pads. Recommended good shoe gear and RTC as needed.   09/06/19: Podiatry office visit w/ Dr.Evans - Foot doing much better. Pt taking doxycycline and gentamicin cream as directed. No worsening factors noted. Ulceration of right sub-first MPJ secondary to DM. Cellulitis resolved. Excisional debridement. Wound cleansed and dressed. Offloading met pads applied to shoes. Continue gentamicin cream. RTC in 2 weeks.   08/30/19: Podiatry office visit w/ Dr.Evans - Ulcer of right food with fat layer exposed. Blister to plantar aspect of right foot present for 10 days. Had a fever that resolved. Reported swelling, erythema, burning and throbbing pain. Placed on prednisone by Dr. [name] for tx of  COPD.Excisional debridement of all necrotic non-viable tissue down to healthy bleeding viable tissue. Wound cleaned and dressed. Script for doxycycline 111m #20 and gentamicin cream to use with daily bandage. Culture taken from wound. RTC in 1 week.   08/28/19: Pulmonology virtual visit w/ Dr. WMelvyn Novas- COPD exacerbation. Given predisone taper. Recommended to get Covid vaccine.   07/19/19: Orthotics w/ RVelora Heckler- Diabetic shoes and custom inserts   07/10/19: CBrayoffice visit w/ Dr. NJohnsie Cancel- DES to RCA in 2014. Cardio stated cholesterol at goal  Medications: Outpatient Encounter Medications as of 10/18/2019  Medication Sig Note  . albuterol (VENTOLIN HFA) 108 (90 Base) MCG/ACT inhaler Inhale 2 puffs into the lungs every 4 (four) hours as needed for wheezing or shortness of breath.   .Marland KitchenamLODipine (NORVASC) 2.5 MG tablet Take 1 tablet (2.5 mg total) by mouth daily.   .Marland Kitchenaspirin 81 MG tablet Take 1 tablet (81 mg total) by mouth daily.   .Marland Kitchenazelastine (ASTELIN) 0.1 % nasal spray Place 2 sprays into both nostrils at bedtime as needed for rhinitis. Use in each nostril as directed   . clopidogrel (PLAVIX) 75 MG tablet Take 1 tablet (75 mg total) by mouth daily.   .Marland Kitchenglucose blood (FREESTYLE TEST STRIPS) test strip Check blood sugar no more than twice daily   . isosorbide mononitrate (IMDUR) 30 MG 24 hr tablet Take 1 tablet (30 mg total) by mouth 2 (two) times daily.   . meclizine (ANTIVERT) 12.5 MG tablet Take 1 tablet (12.5 mg total) 3 (three) times daily as needed by mouth for  dizziness.   . metFORMIN (GLUCOPHAGE) 500 MG tablet Take 1 tablet (500 mg total) by mouth daily with breakfast.   . mometasone-formoterol (DULERA) 100-5 MCG/ACT AERO Inhale 2 puffs into the lungs 2 (two) times daily.   . Multiple Vitamin (MULTIVITAMIN WITH MINERALS) TABS tablet Take 1 tablet by mouth daily.   . pravastatin (PRAVACHOL) 40 MG tablet Take 1 tablet (40 mg total) by mouth daily.   [name] telmisartan (MICARDIS) 20 MG  tablet Take 1 tablet (20 mg total) by mouth daily.   [name] doxycycline (VIBRA-TABS) 100 MG tablet Take 1 tablet (100 mg total) by mouth 2 (two) times daily.   . fluticasone (FLONASE) 50 MCG/ACT nasal spray Place 2 sprays into both nostrils daily as needed for allergies or rhinitis.   [name] gentamicin cream (GARAMYCIN) 0.1 % Apply 1 application topically 2 (two) times daily.   . Misc. Devices (ACAPELLA) MISC Use as directed (Patient not taking: Reported on 10/18/2019)   . nitroGLYCERIN (NITROSTAT) 0.4 MG SL tablet Place 1 tablet (0.4 mg total) under the tongue every 5 (five) minutes as needed for chest pain (up to 3 doses only.). (Patient not taking: Reported on 10/18/2019) 10/18/2019: Hasn't had to use, but has it available   No facility-administered encounter medications on file as of 10/18/2019.   Immunization History  Administered Date(s) Administered  . Fluad Quad(high Dose 65+) 05/11/2019  . Influenza Split 05/26/2011, 06/01/2012  . Influenza Whole 06/30/2007, 06/18/2008, 06/14/2009, 04/24/2010  . Influenza, High Dose Seasonal PF 06/19/2014, 05/02/2015, 06/08/2017, 05/16/2018  . Influenza,inj,Quad PF,6+ Mos 06/10/2013  . Influenza-Unspecified 05/21/2016  . Pneumococcal Conjugate-13 09/10/2015  . Pneumococcal Polysaccharide-23 08/24/2001, 06/30/2007, 06/01/2012  . Td 08/24/2005, 07/06/2016  . Zoster 10/13/2007  . Zoster Recombinat (Shingrix) 09/01/2018, 11/01/2018   Received both COVID vaccines  Moderna January 12th February 9th  Current Diagnosis/Assessment:  Goals Addressed            This Visit's Progress   . A1c goal <7%       -Fasting (before first meal of Tammi Boulier) blood sugar goal is 80-130 -Post-prandial (2 hrs after meal) blood sugar is less than 180    . Check blood sugar 2 to 3 times per week      . Complete a1c lab at next office visit      . LDL goal <70       -Discuss this goal further with your cardiologist. This goal is usually recommended for patients who have CAD  (coronary artery disease)    . Pharmacy Care Plan       Current Barriers:  . Chronic Disease Management support, education, and care coordination needs related to COPD, DM, HTN, HLD, CAD, Vertigo  Pharmacist Clinical Goal(s):  [name] A1c goal <7% . LDL goal <70  Interventions: . Comprehensive medication review performed. . Check blood sugar 2 to 3 times per week  Patient Self Care Activities:  . Patient verbalizes understanding of plan to follow as described above, Self administers medications as prescribed, Calls pharmacy for medication refills, and Calls provider office for new concerns or questions  Initial goal documentation       Married to [place] for 65 years. 3 children (daughter lives in [place]), all married. 9 grandchildren, oldest grandchild is 33, lost one grandson 2 years ago to suicide. 4 grand-dogs. Live in a town home.   COPD    Last spirometry score: 08/16/07?  FEV1 = 55% (1.63 L) FVC = 87% (3.29 L) FEV1/FVC = 49%  Gold Grade: Gold  3 (FEV1 30-49%) (per pulmonology assessment) Current COPD Classification:  A (low sx, <2 exacerbations/yr)  Eosinophil count:   Lab Results  Component Value Date/Time   EOSPCT 4.0 09/06/2018 08:43 AM  %                               Eos (Absolute):  Lab Results  Component Value Date/Time   EOSABS 0.4 09/06/2018 08:43 AM    Tobacco Status:  Social History   Tobacco Use  Smoking Status Former Smoker  . Packs/Jayton Popelka: 1.50  . Years: 39.00  . Pack years: 58.50  . Types: Cigarettes  . Quit date: 08/24/1992  . Years since quitting: 27.1  Smokeless Tobacco Never Used    Patient has failed these meds in past: None noted (symbicort, spiriva listed in D/C meds. No apparent reason for D/C) Patient is currently controlled on the following medications: dulera 100-85mg 2 puffs twice daily, albuterol 2 puffs Q4H PRN Using maintenance inhaler regularly? Yes Frequency of rescue inhaler use:  1-2x per week   Acapella device hasn't used in a  while  Dulera: Pt states that [employer] says they have a limited amount to provide through PAP. Got it through "simple fill" in the past for $40 Currently paying $90/month [employer] to spin off Dulera to new company per a letter patient recent (Engineer, structural Wife to call and give me contact number for [facility] Rx Number [id number] PAP Confirmation Number: [id number] PAP Phone number: [phone] We discussed:  proper inhaler technique  Plan -Continue current medications  Diabetes   Recent Relevant Labs: Lab Results  Component Value Date/Time   HGBA1C 6.4 08/09/2019 09:30 AM   HGBA1C 6.9 (H) 02/07/2019 01:40 PM   MICROALBUR 2.8 (H) 07/06/2016 11:32 AM   MICROALBUR 2.5 (H) 05/02/2015 11:06 AM     Checking BG: Rarely   Last night: 140  Diet: Don't eat a lot of bread or lunch meat. Love hot dogs, but eat them less frequently  Patient is currently controlled on the following medications: metformin 5055monce daily  Last diabetic Eye exam:  Lab Results  Component Value Date/Time   HMDIABEYEEXA Retinopathy (A) 06/12/2019 12:00 AM    Last diabetic Foot exam:  Lab Results  Component Value Date/Time   HMDIABFOOTEX Done/Abnormal 05/02/2015 12:00 AM     We discussed: DM goals (FBG: 80-130, PPBG <180, a1c <7%)  Plan -Complete a1c lab at next office visit -Check blood sugar 2-3 times per week -Continue current medications   Hypertension   CMP Latest Ref Rng & Units 08/09/2019 02/07/2019 09/06/2018  Glucose 70 - 99 mg/dL 122(H) 75 125(H)  BUN 6 - 23 mg/dL _0 Creatinine 0.40 - 1.50 mg/dL 0.91 0.99 0.98  Sodium 135 - 145 mEq/L 132(L) 135 136  Potassium 3.5 - 5.1 mEq/L 5.2 No hemolysis seen(H) 5.2 No hemolysis seen(H) 5.0  Chloride 96 - 112 mEq/L 96 99 99  CO2 19 - 32 mEq/L 32 25 28  Calcium 8.4 - 10.5 mg/dL 9.9 9.7 9.6  Total Protein 6.0 - 8.3 g/dL 6.8 - 6.7  Total Bilirubin 0.2 - 1.2 mg/dL 0.4 - 0.4  Alkaline Phos 39 - 117 U/L 90 - 94  AST 0 - 37 U/L 15 -  13  ALT 0 - 53 U/L 16 - 16  GFR      78.76   71.55   77.02  BP today is:  <130/80 115/72  today  122/77 pulse 78 (average of last 3 readings)  Office blood pressures are  BP Readings from Last 3 Encounters:  08/09/19 140/70  07/10/19 130/64  05/26/19 138/64    Patient has failed these meds in the past: None noted  Patient is currently controlled on the following medications: amlodipine 2.66m daily, telmisartan 249mdaily  Patient checks BP at home daily  Patient home BP readings are ranging: 120s/70s  Was exercising 3x/week for 1 hr at [employer] stopped when Covid occurred.   We discussed diet and exercise extensively  Plan -Continue current medications   Hyperlipidemia   Lipid Panel     Component Value Date/Time   CHOL 152 08/09/2019 0930   TRIG 117.0 08/09/2019 0930   TRIG 181 09/06/2009 1602   HDL 41.00 08/09/2019 0930   CHOLHDL 4 08/09/2019 0930   VLDL 23.4 08/09/2019 0930   LDLCALC 87 08/09/2019 0930     ASCVD 10-year risk: Hx of ASCVD  LDL Goal <70  Patient has failed these meds in past: None Patient is currently uncontrolled for LDL on the following medications: pravastatin 4013maily  We discussed:  diet and exercise extensively  Plan -Continue current medications  -Consider statin titration at next lipid panel   CAD    Patient has failed these meds in past: None noted  Patient is currently controlled on the following medications: pravastatin 40m60mily, aspirin 81mg48mly, clopidogrel 75mg 5my, isosorbide 30mg B72m Had 3 MI with stents  We discussed:  Hx of ASCVD and recommended LDL goal of <70. To discuss further with cardio   Need for dual antiplatelet therapy? To discuss with cardio Cardio yearly Nitro: haven't had to use  Plan -Continue current medications   Vertigo    Patient has failed these meds in past: None noted Patient is currently controlled on the following medications: meclizine 12.5mg TID52mN  Last  used 5 years ago  Plan -Continue current medications   Miscellaneous Meds Azelastine 0.1% uses a few times a week at night multivitamin

## 2019-10-20 DIAGNOSIS — Z87891 Personal history of nicotine dependence: Secondary | ICD-10-CM | POA: Diagnosis not present

## 2019-10-20 DIAGNOSIS — H6123 Impacted cerumen, bilateral: Secondary | ICD-10-CM | POA: Diagnosis not present

## 2019-10-20 DIAGNOSIS — Z974 Presence of external hearing-aid: Secondary | ICD-10-CM | POA: Diagnosis not present

## 2019-10-20 DIAGNOSIS — H903 Sensorineural hearing loss, bilateral: Secondary | ICD-10-CM | POA: Diagnosis not present

## 2019-10-20 NOTE — Patient Instructions (Signed)
Visit Information  Goals Addressed            This Visit's Progress   . A1c goal <7%       -Fasting (before first meal of Antoinne Spadaccini) blood sugar goal is 80-130 -Post-prandial (2 hrs after meal) blood sugar is less than 180    . Check blood sugar 2 to 3 times per week      . Complete a1c lab at next office visit      . LDL goal <70       -Discuss this goal further with your cardiologist. This goal is usually recommended for patients who have CAD (coronary artery disease)    . Pharmacy Care Plan       Current Barriers:  . Chronic Disease Management support, education, and care coordination needs related to COPD, DM, HTN, HLD, CAD, Vertigo  Pharmacist Clinical Goal(s):  [name] A1c goal <7% . LDL goal <70  Interventions: . Comprehensive medication review performed. . Check blood sugar 2 to 3 times per week  Patient Self Care Activities:  . Patient verbalizes understanding of plan to follow as described above, Self administers medications as prescribed, Calls pharmacy for medication refills, and Calls provider office for new concerns or questions  Initial goal documentation        Mr. Knecht was given information about Chronic Care Management services today including:  1. CCM service includes personalized support from designated clinical staff supervised by his physician, including individualized plan of care and coordination with other care providers 2. 24/7 contact phone numbers for assistance for urgent and routine care needs. 3. Service will only be billed when office clinical staff spend 20 minutes or more in a month to coordinate care. 4. Only one practitioner may furnish and bill the service in a calendar month. 5. The patient may stop CCM services at any time (effective at the end of the month) by phone call to the office staff. 6. The patient will be responsible for cost sharing (co-pay) of up to 20% of the service fee (after annual deductible is met).  Patient agreed to  services and verbal consent obtained.   The patient verbalized understanding of instructions provided today and agreed to receive a mailed copy of patient instruction and/or educational materials. The pharmacy team will reach out to the patient again over the next 30 days.   De Blanch, PharmD Clinical Pharmacist [facility] [phone]   Coronary Artery Disease, Male Coronary artery disease (CAD) is a condition in which the arteries that lead to the heart (coronary arteries) become narrow or blocked. The narrowing or blockage can lead to decreased blood flow to the heart. Prolonged reduced blood flow can cause a heart attack (myocardial infarction or MI). This condition may also be called coronary heart disease. Because CAD is the leading cause of death in men, it is important to understand what causes this condition and how it is treated. What are the causes? CAD is most often caused by atherosclerosis. This is the buildup of fat and cholesterol (plaque) on the inside of the arteries. Over time, the plaque may narrow or block the artery, reducing blood flow to the heart. Plaque can also become weak and break off within a coronary artery and cause a sudden blockage. Other less common causes of CAD include:  A blood clot or a piece of a blood clot or other substance that blocks the flow of blood in a coronary artery (embolism).  A tearing of the artery (spontaneous coronary artery dissection).  An enlargement of an artery (aneurysm).  Inflammation (vasculitis) in the artery wall. What increases the risk? The following factors may make you more likely to develop this condition:  Age. Men over age 34 are at a greater risk of CAD.  Family history of CAD.  Gender. Men often develop CAD earlier in life than women.  High blood pressure (hypertension).  Diabetes.  High cholesterol levels.  Tobacco use.  Excessive alcohol use.  Lack of  exercise.  A diet high in saturated and trans fats, such as fried food and processed meat. Other possible risk factors include:  High stress levels.  Depression.  Obesity.  Sleep apnea. What are the signs or symptoms? Many people do not have any symptoms during the early stages of CAD. As the condition progresses, symptoms may include:  Chest pain (angina). The pain can: ? Feel like crushing or squeezing, or like a tightness, pressure, fullness, or heaviness in the chest. ? Last more than a few minutes or can stop and recur. The pain tends to get worse with exercise or stress and to fade with rest.  Pain in the arms, neck, jaw, ear, or back.  Unexplained heartburn or indigestion.  Shortness of breath.  Nausea or vomiting.  Sudden light-headedness.  Sudden cold sweats.  Fluttering or fast heartbeat (palpitations). How is this diagnosed? This condition is diagnosed based on:  Your family and medical history.  A physical exam.  Tests, including: ? A test to check the electrical signals in your heart (electrocardiogram). ? Exercise stress test. This looks for signs of blockage when the heart is stressed with exercise, such as running on a treadmill. ? Pharmacologic stress test. This test looks for signs of blockage when the heart is being stressed with a medicine. ? Blood tests. ? Coronary angiogram. This is a procedure to look at the coronary arteries to see if there is any blockage. During this test, a dye is injected into your arteries so they appear on an X-ray. ? Coronary artery CT scan. This CT scan helps detect calcium deposits in your coronary arteries. Calcium deposits are an indicator of CAD. ? A test that uses sound waves to take a picture of your heart (echocardiogram). ? Chest X-ray. How is this treated? This condition may be treated by:  Healthy lifestyle changes to reduce risk factors.  Medicines such as: ? Antiplatelet medicines and blood-thinning  medicines, such as aspirin. These help to prevent blood clots. ? Nitroglycerin. ? Blood pressure medicines. ? Cholesterol-lowering medicine.  Coronary angioplasty and stenting. During this procedure, a thin, flexible tube is inserted through a blood vessel and into a blocked artery. A balloon or similar device on the end of the tube is inflated to open up the artery. In some cases, a small, mesh tube (stent) is inserted into the artery to keep it open.  Coronary artery bypass surgery. During this surgery, veins or arteries from other parts of the body are used to create a bypass around the blockage and allow blood to reach your heart. Follow these instructions at home: Medicines  Take over-the-counter and prescription medicines only as told by your health care provider.  Do not take the following medicines unless your health care provider approves: ? NSAIDs, such as ibuprofen, naproxen, or celecoxib. ? Vitamin supplements that contain vitamin A, vitamin E, or both. Lifestyle  Follow an exercise program approved by your health care provider. Aim for 150 minutes  of moderate exercise or 75 minutes of vigorous exercise each week.  Maintain a healthy weight or lose weight as approved by your health care provider.  Learn to manage stress or try to limit your stress. Ask your health care provider for suggestions if you need help.  Get screened for depression and seek treatment, if needed.  Do not use any products that contain nicotine or tobacco, such as cigarettes, e-cigarettes, and chewing tobacco. If you need help quitting, ask your health care provider.  Do not use illegal drugs. Eating and drinking   Follow a heart-healthy diet. A dietitian can help educate you about healthy food options and changes. In general, eat plenty of fruits and vegetables, lean meats, and whole grains.  Avoid foods high in: ? Sugar. ? Salt (sodium). ? Saturated fat, such as processed or fatty  meat. ? Trans fat, such as fried foods.  Use healthy cooking methods such as roasting, grilling, broiling, baking, poaching, steaming, or stir-frying.  Do not drink alcohol if your health care provider tells you not to drink.  If you drink alcohol: ? Limit how much you have to 0-2 drinks per Tionne Dayhoff. ? Be aware of how much alcohol is in your drink. In the [place] one drink equals one 12 oz bottle of beer (355 mL), one 5 oz glass of wine (148 mL), or one 1 oz glass of hard liquor (44 mL). General instructions  Manage any other health conditions, such as hypertension and diabetes. These conditions affect your heart.  Your health care provider may ask you to monitor your blood pressure. Ideally, your blood pressure should be below 130/80.  Keep all follow-up visits as told by your health care provider. This is important. Get help right away if:  You have pain in your chest, neck, ear, arm, jaw, stomach, or back that: ? Lasts more than a few minutes. ? Is recurring. ? Is not relieved by taking medicine under your tongue (sublingual nitroglycerin).  You have profuse sweating without cause.  You have unexplained: ? Heartburn or indigestion. ? Shortness of breath or difficulty breathing. ? Fluttering or fast heartbeat (palpitations). ? Nausea or vomiting. ? Fatigue. ? Feelings of nervousness or anxiety. ? Weakness. ? Diarrhea.  You have sudden light-headedness or dizziness.  You faint.  You feel like hurting yourself or think about taking your own life. These symptoms may represent a serious problem that is an emergency. Do not wait to see if the symptoms will go away. Get medical help right away. Call your local emergency services (911 in the [place] Do not drive yourself to the hospital. Summary  Coronary artery disease (CAD) is a condition in which the arteries that lead to the heart (coronary arteries) become narrow or blocked. The narrowing or blockage can lead to a heart  attack.  Many people do not have any symptoms during the early stages of CAD.  CAD can be treated with lifestyle changes, medicines, surgery, or a combination of these treatments. This information is not intended to replace advice given to you by your health care provider. Make sure you discuss any questions you have with your health care provider. Document Revised: 04/29/2018 Document Reviewed: 04/19/2018 Elsevier Patient Education  2020 [employer].

## 2019-10-23 ENCOUNTER — Telehealth: Payer: Self-pay | Admitting: Pharmacist

## 2019-10-23 MED ORDER — ALBUTEROL SULFATE HFA 108 (90 BASE) MCG/ACT IN AERS: 2.0000 | INHALATION_SPRAY | RESPIRATORY_TRACT | 5 refills | Status: DC | PRN

## 2019-10-23 NOTE — Telephone Encounter (Signed)
When speaking to patient and his wife for his initial CCM appointment, we discussed a Patient Assistance Program for [facility] that was initiated by Pulmonology.   I used the information the patient gave me, called the PAP company, and discovered that the patient's application had been received, but was missing the Physician's state license number and signature. Since the patient has Medicare PartD Coverage, they also have to provide an attestation statement. Once these components are completed, the application must be returned to the company, and then the patient can be considered for [facility] Patient Assistance. The company will be moving [place] to a new company called [place] around mid 2021. The representative informed me that if the patient completes the PAP within the first quarter of 2021 they can receive Dulera until mid 2021 or until the current company runs out of medication, whichever occurs first.   Called pt and spoke to his wife, [name], explaining the above. She verbalized understanding. I encouraged her to call me back if there is anything further I can do to assist.   Total Time to coordinate care and discuss with patient: 20 mins

## 2019-11-11 ENCOUNTER — Other Ambulatory Visit: Payer: Self-pay | Admitting: Internal Medicine

## 2019-11-27 ENCOUNTER — Ambulatory Visit: Payer: PPO | Admitting: Internal Medicine

## 2019-12-04 ENCOUNTER — Other Ambulatory Visit: Payer: Self-pay

## 2019-12-04 ENCOUNTER — Encounter: Payer: Self-pay | Admitting: Internal Medicine

## 2019-12-04 ENCOUNTER — Ambulatory Visit: Payer: PPO | Admitting: Internal Medicine

## 2019-12-04 DIAGNOSIS — J449 Chronic obstructive pulmonary disease, unspecified: Secondary | ICD-10-CM | POA: Diagnosis not present

## 2019-12-04 MED ORDER — BUDESONIDE-FORMOTEROL FUMARATE 160-4.5 MCG/ACT IN AERO: INHALATION_SPRAY | RESPIRATORY_TRACT | 2 refills | Status: DC

## 2019-12-04 NOTE — Progress Notes (Signed)
Subjective:     Patient ID: Phillip Jordan, male    DOB: [birth year]   MRN: [id number]    Brief patient profile:  86  year old white  male, ex-smoker (1ppd x 30 years, quit 1990s), diagnosed with COPD per hx by Phillip Jordan (fev1 1.38L/44%, ratio 51 in Jan 2009). C/w GOLD III  Criteria    History of Present Illness  05/25/2013 consult/ [name] re: sob/ prev eval MR with GOLD III COPD Chief Complaint  Patient presents with  . Pulmonary Consult    Referred per Phillip Jordan. The pt c/o DOE for the past 15 years, but worse for the past 6 months. He states that he gets SOB with walking up hills, but does okay with his exercise program 2-3 times per wk. He also c/o hoarseness and cough "for a long time"- cough is prod in the am with minimal clear sputum.      All his days are about the same in terms of activity tolerance  Breathing got better p quit smoking then worse again starting around mid 1990s Better on dulera, no better on spiriva ? Worse hoarseness so stopped it Work on inhaler technique:    Pantoprazole (protonix) 40 mg   Take 30-60 min before first meal of the day and Pepcid 20 mg one bedtime until return to office - this is the best way to tell whether stomach acid is contributing to your problem.  GERD diet    03/12/2017  f/u ov/Phillip Jordan re:   COPD  III dulera 200 / singulair/ gerd rx  Chief Complaint  Patient presents with  . Follow-up    Breathing has improved back to his normal baseline. No new co's. He has not had to use rescue inhaler.    doe still = mmrc3  rec Plan A = Automatic = Dulera 200 Take 2 puffs first thing in am and then another 2 puffs about 12 hours later Also  Pantoprazole (protonix) 40 mg   Take  30-60 min before first meal of the day and Pepcid (famotidine)  20 mg one @  bedtime until return to office - this is the best way to tell whether stomach acid is contributing to your problem.   Plan B = Backup Only use your albuterol as a rescue medication to be used if  you can't catch your breath by resting or doing a relaxed purse lip breathing pattern.  - The less you use it, the better it will work when you need it. - Ok to use the inhaler up to 2 puffs  every 4 hours if you must but call for appointment if use goes up over your usual need - Don't leave home without it !!  (think of it like the spare tire for your car)  Whenever coughing / wheezing/ short of breath >  Take protonix Take 30- 60 min before your first and last meals of the day and once you are better reduce back to protonix 40 mg Take 30-60 min before first meal of the day  Plan C= Crisis If needing the albuterol (proair) more than a few times a week> Prednisone 10 mg take  4 each am x 2 days,   2 each am x 2 days,  1 each am x 2 days and stop     06/14/2017  f/u ov/Phillip Jordan re: copd III on dulera 200/ singulair/ gerd rx/ pred prn used x one cycle since last ov Chief Complaint  Patient presents with  .  Follow-up    He states his breathing is doing well. He rarely uses his albuterol inhaler.    doe improved to  [facility] = can't walk a nl pace on a flat grade s sob but does fine slow and flat eg does fine walking slow anywhere and also [facility]  Sleeps ok  Watery rhinitis worse p meals / not sure singulair helping rec Ok to try off singulair to see if worse nasal or lower respiratory symptoms and if so restart     03/17/2019  f/u ov/Phillip Jordan re: copd II/ maint on dulera 200 and pepcid hs for ? Noct gerd  Chief Complaint  Patient presents with  . Follow-up    Breathing is overall doing well but he is not exercising much. He uses his rescue inhaler about once every other wk.   Dyspnea:  Gym closed / some walking in the street, did not do  as well in [place] but still did 45 min s stopping, slow pace = MMRC2 = can't walk a nl pace on a flat grade s sob but does fine slow and flat  Cough: no change am cough/ congesiton >min mucoid sputum Sleeping: bed flat/ one pillow  SABA  use: rare  rec Ok to try off the famotidine to see if it makes any difference with your congestion in am after you try  the bed blocks for a month   GERD diet   05/11/2019  f/u ov/Phillip Jordan re:  Copd GOLD II/ maint on dulera 200   doing better noct and in am since bed blocks and pepcid at hs (never stopped)  Chief Complaint  Patient presents with  . Follow-up    had round of pred 2 wks ago- SOB and hoarseness has improved. He is using bed blocks and this has helped.    Dyspnea:  MMRC2 = can't walk a nl pace on a flat grade s sob but does fine slow and flat  Cough: better, less congestion esp in am  Sleeping: ok on bed blocks  SABA use: rarely saba  02: no rec Stop pepcid / continue bed blocks   televist  08/28/19 rec pred x 6 days and off   12/04/2019  f/u ov/Phillip Jordan re:  GOLD II/ maint dulera 200  Chief Complaint  Patient presents with  . Follow-up    Breathing is "not bad"- wants to discuss switching from [facility] to Symbicort due to cost. He uses his albuterol inhaler once per wk on average.   Dyspnea:  MMRC2 = can't walk a nl pace on a flat grade s sob but does fine slow and flat / not very active  Cough: none  Sleeping: better, no pepcid hs  SABA use: rarely  02: rarely    No obvious day to day or daytime variability or assoc excess/ purulent sputum or mucus plugs or hemoptysis or cp or chest tightness, subjective wheeze or overt sinus or hb symptoms.   Sleeping fine as above without nocturnal  or early am exacerbation  of respiratory  c/o's or need for noct saba. Also denies any obvious fluctuation of symptoms with weather or environmental changes or other aggravating or alleviating factors except as outlined above   No unusual exposure hx or h/o childhood pna/ asthma or knowledge of premature birth.  Current Allergies, Complete Past Medical History, Past Surgical History, Family History, and Social History were reviewed in [employer] record.  ROS  The  following are not  active complaints unless bolded Hoarseness, sore throat, dysphagia, dental problems, itching, sneezing,  nasal congestion or discharge of excess mucus or purulent secretions, ear ache,   fever, chills, sweats, unintended wt loss or wt gain, classically pleuritic or exertional cp,  orthopnea pnd or arm/hand swelling  or leg swelling, presyncope, palpitations, abdominal pain, anorexia, nausea, vomiting, diarrhea  or change in bowel habits or change in bladder habits, change in stools or change in urine, dysuria, hematuria,  rash, arthralgias, visual complaints, headache, numbness, weakness or ataxia or problems with walking or coordination,  change in mood or  memory.        Current Meds  Medication Sig  . albuterol (VENTOLIN HFA) 108 (90 Base) MCG/ACT inhaler Inhale 2 puffs into the lungs every 4 (four) hours as needed for wheezing or shortness of breath.  [name] amLODipine (NORVASC) 2.5 MG tablet Take 1 tablet (2.5 mg total) by mouth daily.  [name] aspirin 81 MG tablet Take 1 tablet (81 mg total) by mouth daily.  [name] azelastine (ASTELIN) 0.1 % nasal spray Place 2 sprays into both nostrils at bedtime as needed for rhinitis. Use in each nostril as directed  . clopidogrel (PLAVIX) 75 MG tablet Take 1 tablet (75 mg total) by mouth daily.  . fluticasone (FLONASE) 50 MCG/ACT nasal spray Place 2 sprays into both nostrils daily as needed for allergies or rhinitis.  [name] gentamicin cream (GARAMYCIN) 0.1 % Apply 1 application topically 2 (two) times daily.  [name] glucose blood (FREESTYLE TEST STRIPS) test strip Check blood sugar no more than twice daily  . isosorbide mononitrate (IMDUR) 30 MG 24 hr tablet Take 1 tablet (30 mg total) by mouth 2 (two) times daily.  . meclizine (ANTIVERT) 12.5 MG tablet Take 1 tablet (12.5 mg total) 3 (three) times daily as needed by mouth for dizziness.  . metFORMIN (GLUCOPHAGE) 500 MG tablet Take 1 tablet (500 mg total) by mouth daily with breakfast.  . Misc. Devices (ACAPELLA)  MISC Use as directed  . mometasone-formoterol (DULERA) 100-5 MCG/ACT AERO Inhale 2 puffs into the lungs 2 (two) times daily.  . Multiple Vitamin (MULTIVITAMIN WITH MINERALS) TABS tablet Take 1 tablet by mouth daily.  . nitroGLYCERIN (NITROSTAT) 0.4 MG SL tablet Place 1 tablet (0.4 mg total) under the tongue every 5 (five) minutes as needed for chest pain (up to 3 doses only.).  [name] pravastatin (PRAVACHOL) 40 MG tablet Take 1 tablet (40 mg total) by mouth daily.  [name] telmisartan (MICARDIS) 20 MG tablet Take 1 tablet (20 mg total) by mouth daily.             Objective:   Physical Exam      12/04/2019         172  05/11/2019         174  03/17/2019         179  09/15/2018         178  06/15/2018       173  12/13/2017         175  06/14/2017       173  03/12/2017         170  01/15/2017         172  11/30/2016           175    Vital signs reviewed  12/04/2019  - Note at rest 02 sats  96% on RA     Reports upper and lower partials   HEENT : pt wearing mask  not removed for exam due to covid -19 concerns.    NECK :  without JVD/Nodes/TM/ nl carotid upstrokes bilaterally   LUNGS: no acc muscle use,  Mod barrel  contour chest wall with bilateral  Distant bs s audible wheeze and  without cough on insp or exp maneuvers and mod  Hyperresonant  to  percussion bilaterally     CV:  RRR  no s3 or murmur or increase in P2, and no edema   ABD:  soft and nontender with pos mid insp Hoover's  in the supine position. No bruits or organomegaly appreciated, bowel sounds nl  MS:     ext warm without deformities, calf tenderness, cyanosis or clubbing No obvious joint restrictions   SKIN: warm and dry without lesions    NEURO:  alert, approp, nl sensorium with  no motor or cerebellar deficits apparent.               Assessment & Plan:

## 2019-12-04 NOTE — Patient Instructions (Signed)
symbicort 160 = dulera 200 either way dose is  Take 2 puffs first thing in am and then another 2 puffs about 12 hours later.       Please schedule a follow up visit in 6  months but call sooner if needed

## 2019-12-05 ENCOUNTER — Encounter: Payer: Self-pay | Admitting: Internal Medicine

## 2019-12-05 NOTE — Assessment & Plan Note (Signed)
Quit smoking in 1994   - PFT's 07/02/11  FEV1  1.41 (55%) ratio 66 and 12% better p B2 with DLCO 96% - PFTs  07/05/2013 FEV1  1.05 and 1.31 p B2 (24%) with DLCO 59 corrects to 84%  - Spiriva and other mdi result in hoarseness - hfa 05/25/2013 75% p coaching>  90% 07/05/13  - added gerd rx 05/25/2013 >> improved 07/05/13  - rx dulera 200 2bid 07/05/13 > better 10/03/2013   - 10/08/2015 added GERD rx back due to pm strangling/ hoarseness  - 10/25/2015 added flutter - Rehab completed Aug 2017  - PFT's  05/21/2016  FEV1 1.23 (50 % ) ratio 45  p 32 % improvement from saba p dulera 200 prior to study with DLCO  56/57 % corrects to 72  % for alv volume    - added pred x 6 days as backup plan prn as of 03/12/2017   - 09/15/2018  After extensive coaching inhaler device,  effectiveness =    90%    Adequate control on present rx, reviewed in detail with pt > no change in rx needed    Advised symb 160= dulera 200 and best choice for him given AB pattern and prior intol to spiriva but could rechallnge with breztri if losing ground with ex tolernace   F/u can be q 6 m          Each maintenance medication was reviewed in detail including emphasizing most importantly the difference between maintenance and prns and under what circumstances the prns are to be triggered using an action plan format where appropriate.  Total time for H and P, chart review, counseling, teaching device and generating customized AVS unique to this office visit / charting = 20 min

## 2019-12-12 ENCOUNTER — Other Ambulatory Visit: Payer: Self-pay

## 2019-12-13 ENCOUNTER — Other Ambulatory Visit: Payer: Self-pay

## 2019-12-13 ENCOUNTER — Encounter: Payer: Self-pay | Admitting: Internal Medicine

## 2019-12-13 ENCOUNTER — Ambulatory Visit (INDEPENDENT_AMBULATORY_CARE_PROVIDER_SITE_OTHER): Payer: PPO | Admitting: Internal Medicine

## 2019-12-13 VITALS — BP 151/31 | HR 72 | Temp 96.2°F | Resp 18 | Ht 69.0 in | Wt 172.0 lb

## 2019-12-13 DIAGNOSIS — E11319 Type 2 diabetes mellitus with unspecified diabetic retinopathy without macular edema: Secondary | ICD-10-CM | POA: Diagnosis not present

## 2019-12-13 DIAGNOSIS — I10 Essential (primary) hypertension: Secondary | ICD-10-CM

## 2019-12-13 DIAGNOSIS — R1903 Right lower quadrant abdominal swelling, mass and lump: Secondary | ICD-10-CM | POA: Diagnosis not present

## 2019-12-13 NOTE — Progress Notes (Signed)
Subjective:    Patient ID: Phillip Jordan, male    DOB: August 11, 1930, 86 y.o.   MRN: [id number]  DOS:  12/13/2019 Type of visit - description: Follow-up Today we talk about diabetes, COPD and some other issues.  Having pain at the right hip anteriorly, mostly when he walks, no back pain. Lump at the groin?.  Review of Systems Denies chest pain or difficulty breathing DOE at baseline. No nausea, vomiting, diarrhea.  No blood in the stools  Past Medical History:  Diagnosis Date  . Allergic rhinitis   . Aortic dissection ([place])    f/u by cards - AAA with distal dissection.  [name] CAD (coronary artery disease)    a. s/p MI 1992. b. Prior hx stenting to RCA/Cx. b. 05/2013: [place] s/p DES to distal RCA then staged DES to prox LAD.   [name] COPD with asthma ([place])   . Diabetes mellitus   . Hyperlipidemia   . Lichen planus    Right midline inferior chest, Dr. [name] Sensing  . SCC (squamous cell carcinoma)   . Shingles     Past Surgical History:  Procedure Laterality Date  . BACK SURGERY  1993  . CARDIAC CATHETERIZATION     left, w/ coronary angiography and left ventriculograpy  . LEFT HEART CATHETERIZATION WITH CORONARY ANGIOGRAM N/A 06/09/2013   Procedure: LEFT HEART CATHETERIZATION WITH CORONARY ANGIOGRAM;  Surgeon: [name], MD;  Location: [facility] CATH LAB;  Service: Cardiovascular;  Laterality: N/A;  . PERCUTANEOUS CORONARY STENT INTERVENTION (PCI-S) N/A 06/12/2013   Procedure: PERCUTANEOUS CORONARY STENT INTERVENTION (PCI-S);  Surgeon: [name], MD;  Location: [facility] CATH LAB;  Service: Cardiovascular;  Laterality: N/A;  prox LAD  . PTCA     w/ placement of drugeluting stent in the distal right coronary artery, and distal Cardiologist: [name]    Allergies as of 12/13/2019   No Known Allergies     Medication List       Accurate as of December 13, 2019 11:59 PM. If you have any questions, ask your nurse or doctor.        Acapella Misc Use as directed    albuterol 108 (90 Base) MCG/ACT inhaler Commonly known as: VENTOLIN HFA Inhale 2 puffs into the lungs every 4 (four) hours as needed for wheezing or shortness of breath.   amLODipine 2.5 MG tablet Commonly known as: NORVASC Take 1 tablet (2.5 mg total) by mouth daily.   aspirin 81 MG tablet Take 1 tablet (81 mg total) by mouth daily.   azelastine 0.1 % nasal spray Commonly known as: ASTELIN Place 2 sprays into both nostrils at bedtime as needed for rhinitis. Use in each nostril as directed   budesonide-formoterol 160-4.5 MCG/ACT inhaler Commonly known as: Symbicort Take 2 puffs first thing in am and then another 2 puffs about 12 hours later.   clopidogrel 75 MG tablet Commonly known as: PLAVIX Take 1 tablet (75 mg total) by mouth daily.   fluticasone 50 MCG/ACT nasal spray Commonly known as: FLONASE Place 2 sprays into both nostrils daily as needed for allergies or rhinitis.   gentamicin cream 0.1 % Commonly known as: GARAMYCIN Apply 1 application topically 2 (two) times daily.   glucose blood test strip Commonly known as: FREESTYLE TEST STRIPS Check blood sugar no more than twice daily   isosorbide mononitrate 30 MG 24 hr tablet Commonly known as: IMDUR Take 1 tablet (30 mg total) by mouth 2 (two) times daily.   meclizine 12.5 MG  tablet Commonly known as: ANTIVERT Take 1 tablet (12.5 mg total) 3 (three) times daily as needed by mouth for dizziness.   metFORMIN 500 MG tablet Commonly known as: GLUCOPHAGE Take 1 tablet (500 mg total) by mouth daily with breakfast.   multivitamin with minerals Tabs tablet Take 1 tablet by mouth daily.   nitroGLYCERIN 0.4 MG SL tablet Commonly known as: NITROSTAT Place 1 tablet (0.4 mg total) under the tongue every 5 (five) minutes as needed for chest pain (up to 3 doses only.).   pravastatin 40 MG tablet Commonly known as: PRAVACHOL Take 1 tablet (40 mg total) by mouth daily.   telmisartan 20 MG tablet Commonly known as:  MICARDIS Take 1 tablet (20 mg total) by mouth daily.          Objective:   Physical Exam Abdominal:      BP (!) 151/31 (BP Location: Left Arm, Patient Position: Sitting, Cuff Size: Normal)   Pulse 72   Temp (!) 96.2 F (35.7 C) (Temporal)   Resp 18   Ht 5\' 9"  (1.753 m)   Wt 172 lb (78 kg)   SpO2 99%   BMI 25.40 kg/m  General:   Well developed, NAD, BMI noted.  HEENT:  Normocephalic . Face symmetric, atraumatic Lungs:  CTA B Normal respiratory effort, no intercostal retractions, no accessory muscle use. Heart: RRR,  no murmur. Abdomen: See graphic.  No inguinal hernias.    Lymphatic system: No lymphadenopathies at the axillary areas or inguinal areas. Skin: Right plantar area -- has a callus at the base of the great toe without redness, swelling, ulcers. Lower extremities: no pretibial edema bilaterally MSK: Hip rotations symmetric, some pain elicited by rotating the right hip. Neurologic:  alert & oriented X3.  Speech normal, gait appropriate for age and unassisted Psych--  Cognition and judgment appear intact.  Cooperative with normal attention span and concentration.  Behavior appropriate. No anxious or depressed appearing.     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 Ronnald Ramp Chronic hoarseness, status post ENT eval ~ 2012 Normal DEXA 2012 HOH H/o Shingles  PLAN: COPD: Last visit with pulmonary 12/04/2019 DM: Continue Metformin, check A1c DM foot ulcer: Has seen Dr. Amalia Hailey since the last visit, the area finally healed, recommend to monitor the problem. HTN: BP today slightly elevated, at home reports on average is 118/72.  Continue amlodipine, Micardis.  Check a CMP Hyperlipidemia: On Pravachol, last LDL very  close to goal.  No change Lump, abdomen: As described above, it is not at the groin area, there is symmetric but thinner structure on the left side, possibly a asymmetric soft tissue, doubt actual lymph node or tumor.  Reassess in 3 months, advised patient to check the area from time to time and let me know if it is growing.  Check CBC. Hip pain: Possibly DJD, Tylenol as needed Cerumen impaction?  On exam there is a small amount of cerumen on both sides, worse on the left, I removed some wax with a spoon. TMs normal. RTC 3 months    This visit occurred during the SARS-CoV-2 public health emergency.  Safety protocols were in place, including screening questions prior to the visit, additional usage of staff PPE, and extensive cleaning of exam  room while observing appropriate contact time as indicated for disinfecting solutions.

## 2019-12-13 NOTE — Progress Notes (Signed)
Pre visit review using our clinic review tool, if applicable. No additional management support is needed unless otherwise documented below in the visit note. 

## 2019-12-13 NOTE — Patient Instructions (Addendum)
  Check your blood pressure at least weekly BP GOAL is between 110/65 and  135/85. If it is consistently higher or lower, let me know  Take Tylenol as needed for hip pain.  Call if worse  Check the "lump" on the right side, if it is growing, is different, is very painful : let  me know  GO TO THE LAB : Get the blood work     [place], [place] back for for a checkup in 3 months

## 2019-12-14 LAB — CBC WITH DIFFERENTIAL/PLATELET
Basophils Absolute: 0.1 10*3/uL (ref 0.0–0.1)
Basophils Relative: 1.3 % (ref 0.0–3.0)
Eosinophils Absolute: 0.3 10*3/uL (ref 0.0–0.7)
Eosinophils Relative: 3.2 % (ref 0.0–5.0)
HCT: 42.3 % (ref 39.0–52.0)
Hemoglobin: 14.4 g/dL (ref 13.0–17.0)
Lymphocytes Relative: 21.4 % (ref 12.0–46.0)
Lymphs Abs: 2 10*3/uL (ref 0.7–4.0)
MCHC: 34.1 g/dL (ref 30.0–36.0)
MCV: 91.1 fl (ref 78.0–100.0)
Monocytes Absolute: 1 10*3/uL (ref 0.1–1.0)
Monocytes Relative: 10.6 % (ref 3.0–12.0)
Neutro Abs: 6 10*3/uL (ref 1.4–7.7)
Neutrophils Relative %: 63.5 % (ref 43.0–77.0)
Platelets: 282 10*3/uL (ref 150.0–400.0)
RBC: 4.65 Mil/uL (ref 4.22–5.81)
RDW: 13.3 % (ref 11.5–15.5)
WBC: 9.5 10*3/uL (ref 4.0–10.5)

## 2019-12-14 LAB — COMPREHENSIVE METABOLIC PANEL
ALT: 12 U/L (ref 0–53)
AST: 15 U/L (ref 0–37)
Albumin: 4.3 g/dL (ref 3.5–5.2)
Alkaline Phosphatase: 90 U/L (ref 39–117)
BUN: 15 mg/dL (ref 6–23)
CO2: 32 mEq/L (ref 19–32)
Calcium: 9.7 mg/dL (ref 8.4–10.5)
Chloride: 98 mEq/L (ref 96–112)
Creatinine, Ser: 1.11 mg/dL (ref 0.40–1.50)
GFR: 62.58 mL/min (ref >60.00)
Glucose, Bld: 82 mg/dL (ref 70–99)
Potassium: 5.2 mEq/L — ABNORMAL HIGH (ref 3.5–5.1)
Sodium: 138 mEq/L (ref 135–145)
Total Bilirubin: 0.4 mg/dL (ref 0.2–1.2)
Total Protein: 6.8 g/dL (ref 6.0–8.3)

## 2019-12-14 LAB — HEMOGLOBIN A1C: Hgb A1c MFr Bld: 6.5 % (ref 4.6–6.5)

## 2019-12-14 NOTE — Assessment & Plan Note (Signed)
COPD: Last visit with pulmonary 12/04/2019 DM: Continue Metformin, check A1c DM foot ulcer: Has seen Dr. Amalia Hailey since the last visit, the area finally healed, recommend to monitor the problem. HTN: BP today slightly elevated, at home reports on average is 118/72.  Continue amlodipine, Micardis.  Check a CMP Hyperlipidemia: On Pravachol, last LDL very close to goal.  No change Lump, abdomen: As described above, it is not at the groin area, there is symmetric but thinner structure on the left side, possibly a asymmetric soft tissue, doubt actual lymph node or tumor.  Reassess in 3 months, advised patient to check the area from time to time and let me know if it is growing.  Check CBC. Hip pain: Possibly DJD, Tylenol as needed Cerumen impaction?  On exam there is a small amount of cerumen on both sides, worse on the left, I removed some wax with a spoon. TMs normal. RTC 3 months

## 2020-01-02 ENCOUNTER — Telehealth: Payer: Self-pay | Admitting: Podiatry

## 2020-01-02 NOTE — Telephone Encounter (Signed)
Pt wife called stating that pt has a rt foot ulcer that is smaller then a dime size she states that the foot is swollen. Trying to work pt on schedule to be seen , pt wife Tessie Fass would like to know how to care for it till pt can get in please advise

## 2020-01-02 NOTE — Telephone Encounter (Signed)
I spoke with pt's wife and they do not have a antibiotic cream so I told them to keep the area clean and dry with a light dressing or bandaid until seen tomorrow.

## 2020-01-03 ENCOUNTER — Ambulatory Visit: Payer: PPO | Admitting: Podiatry

## 2020-01-03 ENCOUNTER — Other Ambulatory Visit: Payer: Self-pay

## 2020-01-03 DIAGNOSIS — E0843 Diabetes mellitus due to underlying condition with diabetic autonomic (poly)neuropathy: Secondary | ICD-10-CM

## 2020-01-03 DIAGNOSIS — L97512 Non-pressure chronic ulcer of other part of right foot with fat layer exposed: Secondary | ICD-10-CM

## 2020-01-03 MED ORDER — GENTAMICIN SULFATE 0.1 % EX CREA: 1.0000 "application " | TOPICAL_CREAM | Freq: Two times a day (BID) | CUTANEOUS | 1 refills | Status: DC

## 2020-01-03 MED ORDER — DOXYCYCLINE HYCLATE 100 MG PO TABS: 100.0000 mg | ORAL_TABLET | Freq: Two times a day (BID) | ORAL | 0 refills | Status: DC

## 2020-01-07 NOTE — Progress Notes (Signed)
   HPI: 86 y.o. male presenting today for follow up evaluation of the right sub-first MPJ. He states the wound has reappeared. He reports associated swelling of the foot. He reports some numbness and tingling in the toes as well. He has not done anything for treatment yet and denies modifying factors. Patient is here for further evaluation and treatment.   Past Medical History:  Diagnosis Date  . Allergic rhinitis   . Aortic dissection ([place])    f/u by cards - AAA with distal dissection.  [name] CAD (coronary artery disease)    a. s/p MI 1992. b. Prior hx stenting to RCA/Cx. b. 05/2013: [place] s/p DES to distal RCA then staged DES to prox LAD.   [name] COPD with asthma ([place])   . Diabetes mellitus   . Hyperlipidemia   . Lichen planus    Right midline inferior chest, Dr. [name] Sensing  . SCC (squamous cell carcinoma)   . Shingles      Physical Exam: General: The patient is alert and oriented x3 in no acute distress.  Dermatology: Wound #1 noted to the right sub-first MPJ measuring approximately 2.5 x 2.0 x 0.2 cm.  To the above-noted ulceration, there is no eschar. There is a moderate amount of slough, fibrin and necrotic tissue. Granulation tissue and wound base is red. There is no malodor. There is a minimal amount of serosanginous drainage noted. Periwound integrity is intact.   Vascular: Palpable pedal pulses bilaterally. No edema or erythema noted. Capillary refill within normal limits.  Neurological: Epicritic and protective threshold diminished bilaterally.   Musculoskeletal Exam: Range of motion within normal limits to all pedal and ankle joints bilateral. Muscle strength 5/5 in all groups bilateral.   Assessment: 1. Ulceration of the right sub-first MPJ secondary to diabetes mellitus  2. Diabetes mellitus w/ peripheral neuropathy   Plan of Care:  1. Patient evaluated.   2. Medically necessary excisional debridement including subcutaneous tissue was performed using a tissue nipper and  a chisel blade. Excisional debridement of all the necrotic nonviable tissue down to healthy bleeding viable tissue was performed with post-debridement measurements same as pre-. 3. The wound was cleansed and dry sterile dressing applied. 4. Prescription for Gentamicin cream provided to patient to use daily with a bandage.  5. Prescription for Doxycycline 100 mg provided to patient.  6. Post op shoe dispensed.  7. Return to clinic in 3 weeks.      [name], DPM [facility]  Dr. [name], DPM    [address], [place] [address]                Office [phone]  Fax [phone]

## 2020-01-25 ENCOUNTER — Other Ambulatory Visit: Payer: Self-pay | Admitting: Internal Medicine

## 2020-01-31 ENCOUNTER — Ambulatory Visit: Payer: PPO | Admitting: Podiatry

## 2020-01-31 ENCOUNTER — Encounter: Payer: Self-pay | Admitting: Podiatry

## 2020-01-31 ENCOUNTER — Other Ambulatory Visit: Payer: Self-pay

## 2020-01-31 DIAGNOSIS — E0843 Diabetes mellitus due to underlying condition with diabetic autonomic (poly)neuropathy: Secondary | ICD-10-CM

## 2020-01-31 DIAGNOSIS — L97512 Non-pressure chronic ulcer of other part of right foot with fat layer exposed: Secondary | ICD-10-CM | POA: Diagnosis not present

## 2020-01-31 NOTE — Progress Notes (Signed)
   HPI: 86 y.o. male presenting today for follow up evaluation of an ulcer to the right sub-first MPJ.  Patient states that he is doing very well.  He has been applying the antibiotic cream and a Band-Aid as directed.  For the last week he has not been applying the antibiotic cream and a Band-Aid because he states that the wound has healed up.  He did take his oral doxycycline as prescribed.  He presents today for follow-up treatment evaluation  Past Medical History:  Diagnosis Date  . Allergic rhinitis   . Aortic dissection ([place])    f/u by cards - AAA with distal dissection.  [name] CAD (coronary artery disease)    a. s/p MI 1992. b. Prior hx stenting to RCA/Cx. b. 05/2013: [place] s/p DES to distal RCA then staged DES to prox LAD.   [name] COPD with asthma ([place])   . Diabetes mellitus   . Hyperlipidemia   . Lichen planus    Right midline inferior chest, Dr. [name] Sensing  . SCC (squamous cell carcinoma)   . Shingles      Physical Exam: General: The patient is alert and oriented x3 in no acute distress.  Dermatology: Wound #1 noted to the right subfirst MTPJ has healed.  Complete reepithelialization has occurred.  No drainage noted.  Vascular: Palpable pedal pulses bilaterally. No edema or erythema noted. Capillary refill within normal limits.  Neurological: Epicritic and protective threshold diminished bilaterally.   Musculoskeletal Exam: Range of motion within normal limits to all pedal and ankle joints bilateral. Muscle strength 5/5 in all groups bilateral.   Assessment: 1. Ulceration of the right sub-first MPJ secondary to diabetes mellitus  2. Diabetes mellitus w/ peripheral neuropathy   Plan of Care:  1. Patient evaluated.   2.  Light debridement performed using a chisel blade without incident or bleeding 3.  Discontinue postoperative shoe.  Recommend good supportive shoes 4.  Patient is going to buy some Orthofeet sandals.  I told him this is completely fine and to make sure he is  always wearing good supportive shoes 5.  Return to clinic on next scheduled routine foot care appointment   [name], DPM [facility]  Dr. [name], DPM    [address], [place] [address]                Office [phone]  Fax [phone]

## 2020-02-01 ENCOUNTER — Telehealth: Payer: Self-pay | Admitting: Internal Medicine

## 2020-02-01 ENCOUNTER — Other Ambulatory Visit: Payer: Self-pay | Admitting: Internal Medicine

## 2020-02-01 NOTE — Telephone Encounter (Signed)
Rx sent 

## 2020-02-01 NOTE — Telephone Encounter (Signed)
Medication: amLODipine (NORVASC) 2.5 MG tablet [[id number]]    Has the patient contacted their pharmacy? Yes.   (If no, request that the patient contact the pharmacy for the refill.) (If yes, when and what did the pharmacy advise?)  Preferred Pharmacy (with phone number or street name): [facility], [address]  [place], [place] [address]  Phone:  [phone] Fax:  [phone]   Agent: Please be advised that RX refills may take up to 3 business days. We ask that you follow-up with your pharmacy.

## 2020-02-09 ENCOUNTER — Other Ambulatory Visit: Payer: Self-pay | Admitting: Internal Medicine

## 2020-03-13 ENCOUNTER — Ambulatory Visit: Payer: PPO | Admitting: Internal Medicine

## 2020-03-13 ENCOUNTER — Telehealth: Payer: Self-pay | Admitting: Internal Medicine

## 2020-03-13 NOTE — Telephone Encounter (Signed)
Left message for patient to call back and schedule Medicare Annual Wellness Visit (AWV) with Nurse Health Advisor   This should be a 68 MINUTE VISIT.  Last AWV 03/03/19

## 2020-03-19 ENCOUNTER — Other Ambulatory Visit: Payer: Self-pay

## 2020-03-19 ENCOUNTER — Ambulatory Visit (INDEPENDENT_AMBULATORY_CARE_PROVIDER_SITE_OTHER): Payer: PPO | Admitting: Internal Medicine

## 2020-03-19 ENCOUNTER — Encounter: Payer: Self-pay | Admitting: Internal Medicine

## 2020-03-19 VITALS — BP 135/78 | HR 74 | Temp 98.2°F | Resp 16 | Ht 69.0 in | Wt 168.1 lb

## 2020-03-19 DIAGNOSIS — E11319 Type 2 diabetes mellitus with unspecified diabetic retinopathy without macular edema: Secondary | ICD-10-CM

## 2020-03-19 DIAGNOSIS — I10 Essential (primary) hypertension: Secondary | ICD-10-CM

## 2020-03-19 NOTE — Patient Instructions (Addendum)
Please schedule Medicare Wellness with Glenard Haring.   Continue checking your blood pressures regularly BP GOAL is betwe en 110/65 and  135/85. If it is consistently higher or lower, let me know    GO TO THE FRONT DESK, PLEASE SCHEDULE YOUR APPOINTMENTS Come back for a checkup in approximately 3 to 4 months

## 2020-03-19 NOTE — Progress Notes (Signed)
Subjective:    Patient ID: Phillip Jordan, male    DOB: [birth year], 86 y.o.   MRN: [id number]  DOS:  03/19/2020 Type of visit - description: rov At the last visit, he reported a lump on the right lower abdomen. Since then, he reports no lumps, occasional discomfort there if he is stands for too long.  Few days ago, BP was low for a couple of days, in the 102 range. At the time he did not suspect he was dehydrated (no nausea, vomiting or diarrhea) Had no symptoms, denies chest pain, dizziness. BPs are now better.   Review of Systems See above   Past Medical History:  Diagnosis Date  . Allergic rhinitis   . Aortic dissection ([place])    f/u by cards - AAA with distal dissection.  [name] CAD (coronary artery disease)    a. s/p MI 1992. b. Prior hx stenting to RCA/Cx. b. 05/2013: [place] s/p DES to distal RCA then staged DES to prox LAD.   [name] COPD with asthma ([place])   . Diabetes mellitus   . Hyperlipidemia   . Lichen planus    Right midline inferior chest, Dr. [name] Sensing  . SCC (squamous cell carcinoma)   . Shingles     Past Surgical History:  Procedure Laterality Date  . BACK SURGERY  1993  . CARDIAC CATHETERIZATION     left, w/ coronary angiography and left ventriculograpy  . LEFT HEART CATHETERIZATION WITH CORONARY ANGIOGRAM N/A 06/09/2013   Procedure: LEFT HEART CATHETERIZATION WITH CORONARY ANGIOGRAM;  Surgeon: [name], MD;  Location: [facility] CATH LAB;  Service: Cardiovascular;  Laterality: N/A;  . PERCUTANEOUS CORONARY STENT INTERVENTION (PCI-S) N/A 06/12/2013   Procedure: PERCUTANEOUS CORONARY STENT INTERVENTION (PCI-S);  Surgeon: [name], MD;  Location: [facility] CATH LAB;  Service: Cardiovascular;  Laterality: N/A;  prox LAD  . PTCA     w/ placement of drugeluting stent in the distal right coronary artery, and distal Cardiologist: [name]    Allergies as of 03/19/2020   No Known Allergies     Medication List       Accurate as of March 19, 2020 11:59  PM. If you have any questions, ask your nurse or doctor.        STOP taking these medications   doxycycline 100 MG tablet Commonly known as: VIBRA-TABS Stopped by: Kathlene November, MD   gentamicin cream 0.1 % Commonly known as: GARAMYCIN Stopped by: Kathlene November, MD     TAKE these medications   Acapella Misc Use as directed   albuterol 108 (90 Base) MCG/ACT inhaler Commonly known as: VENTOLIN HFA Inhale 2 puffs into the lungs every 4 (four) hours as needed for wheezing or shortness of breath.   amLODipine 2.5 MG tablet Commonly known as: NORVASC Take 1 tablet (2.5 mg total) by mouth daily.   aspirin 81 MG tablet Take 1 tablet (81 mg total) by mouth daily.   azelastine 0.1 % nasal spray Commonly known as: ASTELIN Place 2 sprays into both nostrils at bedtime as needed for rhinitis. Use in each nostril as directed   budesonide-formoterol 160-4.5 MCG/ACT inhaler Commonly known as: Symbicort Take 2 puffs first thing in am and then another 2 puffs about 12 hours later.   clopidogrel 75 MG tablet Commonly known as: PLAVIX Take 1 tablet (75 mg total) by mouth daily.   fluticasone 50 MCG/ACT nasal spray Commonly known as: FLONASE Place 2 sprays into both nostrils daily as needed for allergies  or rhinitis.   glucose blood test strip Commonly known as: FREESTYLE TEST STRIPS Check blood sugar no more than twice daily   isosorbide mononitrate 30 MG 24 hr tablet Commonly known as: IMDUR Take 1 tablet (30 mg total) by mouth 2 (two) times daily.   meclizine 12.5 MG tablet Commonly known as: ANTIVERT Take 1 tablet (12.5 mg total) 3 (three) times daily as needed by mouth for dizziness.   metFORMIN 500 MG tablet Commonly known as: GLUCOPHAGE Take 1 tablet (500 mg total) by mouth daily with breakfast.   multivitamin with minerals Tabs tablet Take 1 tablet by mouth daily.   nitroGLYCERIN 0.4 MG SL tablet Commonly known as: NITROSTAT Place 1 tablet (0.4 mg total) under the tongue  every 5 (five) minutes as needed for chest pain (up to 3 doses only.).   pravastatin 40 MG tablet Commonly known as: PRAVACHOL Take 1 tablet (40 mg total) by mouth daily.   telmisartan 20 MG tablet Commonly known as: MICARDIS Take 1 tablet (20 mg total) by mouth daily.          Objective:   Physical Exam BP (!) 135/78 (BP Location: Left Arm, Patient Position: Sitting, Cuff Size: Small)   Pulse 74   Temp 98.2 F (36.8 C) (Oral)   Resp 16   Ht 5\' 9"  (1.753 m)   Wt 168 lb 2 oz (76.3 kg)   SpO2 96%   BMI 24.83 kg/m  General:   Well developed, NAD, BMI noted.  HEENT:  Normocephalic . Face symmetric, atraumatic Lungs:  CTA B Normal respiratory effort, no intercostal retractions, no accessory muscle use. Heart: RRR,  no murmur.  Abdomen:  Not distended, soft, non-tender. No rebound or rigidity.   Skin: Not pale. Not jaundice Lower extremities: no pretibial edema bilaterally  Neurologic:  alert & oriented X3.  Speech normal, gait appropriate for age and unassisted Psych--  Cognition and judgment appear intact.  Cooperative with normal attention span and concentration.  Behavior appropriate. No anxious or depressed appearing.     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 Ronnald Ramp Chronic hoarseness, status post ENT eval ~ 2012 Normal DEXA 2012 HOH H/o Shingles  PLAN: DM, on Metformin, last A1c satisfactory.  Doing well with diet, it has actually improved.  CBGs in the 130s. HTN: Currently on amlodipine, Imdur, Micardis.  Last potassium 5.2, minimally elevated, at baseline, recheck on RTC. BP was low few days ago with no symptoms, today is very good.  No change. Lump, abdomen: See last visit, I reexamined the area, the abdomen  is free of mass or asymmetries. Preventive care: Had COVID shots x2 RTC 3 to 4 months  This visit occurred during the SARS-CoV-2 public health emergency.  Safety protocols were in place, including screening questions prior to the visit, additional usage of staff PPE, and extensive cleaning of exam room while observing appropriate contact time as indicated for disinfecting solutions.

## 2020-03-19 NOTE — Progress Notes (Signed)
Pre visit review using our clinic review tool, if applicable. No additional management support is needed unless otherwise documented below in the visit note. 

## 2020-03-20 NOTE — Assessment & Plan Note (Signed)
DM, on Metformin, last A1c satisfactory.  Doing well with diet, it has actually improved.  CBGs in the 130s. HTN: Currently on amlodipine, Imdur, Micardis.  Last potassium 5.2, minimally elevated, at baseline, recheck on RTC. BP was low few days ago with no symptoms, today is very good.  No change. Lump, abdomen: See last visit, I reexamined the area, the abdomen is free of mass or asymmetries. Preventive care: Had COVID shots x2 RTC 3 to 4 months

## 2020-03-29 ENCOUNTER — Other Ambulatory Visit: Payer: Self-pay | Admitting: Internal Medicine

## 2020-04-01 ENCOUNTER — Other Ambulatory Visit: Payer: Self-pay

## 2020-04-01 ENCOUNTER — Ambulatory Visit: Payer: PPO | Admitting: Podiatry

## 2020-04-01 DIAGNOSIS — L97512 Non-pressure chronic ulcer of other part of right foot with fat layer exposed: Secondary | ICD-10-CM | POA: Diagnosis not present

## 2020-04-01 DIAGNOSIS — E0843 Diabetes mellitus due to underlying condition with diabetic autonomic (poly)neuropathy: Secondary | ICD-10-CM

## 2020-04-01 NOTE — Progress Notes (Signed)
   Subjective:  86 y.o. male with PMHx of diabetes mellitus presenting today for recurrence of an ulcer that is developed to the plantar aspect of the right first MTPJ.  Patient has a very high arch foot that causes a lot of pressure to the area.  He normally comes in for routine foot care however his developed some bleeding and discoloration around the area.  His wife was concerned and he presents today for further treatment and evaluation.  He currently wears diabetic shoes and Ortho feet shoes with felt offloading pads to take pressure off of that area.   Past Medical History:  Diagnosis Date  . Allergic rhinitis   . Aortic dissection ([place])    f/u by cards - AAA with distal dissection.  [name] CAD (coronary artery disease)    a. s/p MI 1992. b. Prior hx stenting to RCA/Cx. b. 05/2013: [place] s/p DES to distal RCA then staged DES to prox LAD.   [name] COPD with asthma ([place])   . Diabetes mellitus   . Hyperlipidemia   . Lichen planus    Right midline inferior chest, Dr. [name] Sensing  . SCC (squamous cell carcinoma)   . Shingles       Objective/Physical Exam General: The patient is alert and oriented x3 in no acute distress.  Dermatology:  Wound #1 noted to the 0.6 x 0.6 x 0.2 cm (LxWxD).   To the noted ulceration(s), there is no eschar. There is a moderate amount of slough, fibrin, and necrotic tissue noted. Granulation tissue and wound base is red. There is a minimal amount of serosanguineous drainage noted. There is no exposed bone muscle-tendon ligament or joint. There is no malodor. Periwound integrity is intact. Skin is warm, dry and supple bilateral lower extremities.  Vascular: Palpable pedal pulses bilaterally. No edema or erythema noted. Capillary refill within normal limits.  Neurological: Epicritic and protective threshold diminished bilaterally.   Musculoskeletal Exam: Range of motion within normal limits to all pedal and ankle joints bilateral. Muscle strength 5/5 in all groups  bilateral.   Assessment: 1.  Ulcer subfirst MTPJ right foot secondary to diabetes mellitus 2. diabetes mellitus w/ peripheral neuropathy   Plan of Care:  1. Patient was evaluated. 2. medically necessary excisional debridement including subcutaneous tissue was performed using a tissue nipper and a chisel blade. Excisional debridement of all the necrotic nonviable tissue down to healthy bleeding viable tissue was performed with post-debridement measurements same as pre-. 3. the wound was cleansed and dry sterile dressing applied. 4.  Resume gentamicin cream daily.  Patient has a prescription for gentamicin cream at his home  5.  Patient is to return to clinic in 3 weeks.   [name], DPM [facility]  Dr. [name], [address]                                        [place], [place] [address]                Office [phone]  Fax [phone]

## 2020-04-17 ENCOUNTER — Telehealth: Payer: Self-pay | Admitting: Pharmacist

## 2020-04-17 ENCOUNTER — Telehealth: Payer: PPO

## 2020-04-17 NOTE — Progress Notes (Addendum)
Chronic Care Management Pharmacy Assistant   Name: Phillip Jordan  MRN: [id number] DOB: 09-Aug-1930  Reason for Encounter: Disease State  Patient Questions:  1.  Have you seen any other providers since your last visit? Yes  2.  Any changes in your medicines or health? Yes  PCP : Colon Branch, MD   Their chronic conditions include: COPD, DM, HTN, HLD, CAD, Vertigo  Office Visits: 12-13-2019 (PCP) Patient presented in the office with Dr. [name] c/o pain at the right hip, mostly when he walks. Patient's BP was 151/31 at the time of visit sitting. Patient was advised to check his BP weekly and take Tylenol as needed for pain. Blood work was ordered.  03-19-2020 (PCP) Patient presented in the office with Dr. [name] for a follow up . Patient previously reported a lump on the right lower abdomen. Since then, he reports no lumps, occasional discomfort when standing too long. Patient also reported ow BP a few days before OV. Doxycycline 100mg  tab and gentamicin cream 0.1% were discontinued.   Consults: 04-01-2020(Podiatry) Patient presented in the office for a recurrent ulcer.secondary to DM. Notes indicate an excisional debridement, wound was cleansed.  01-31-2020 (Podiatry) Patient presented in the office for a recurrent ulcer.secondary to DM. Notes indicate light debridement performed. Discontinue use of postoperative shoe.  01-03-2020 (Podiatry) Patient presented in the office for follow-up evaluation of the right sub-first MPJ. Patient stated wound has reappeared and had associated swelling of the foot. Also reported numbness and tingling in the toes. An excisional debridement was performed. Post op Shoe was dispensed. Patient was prescribed Doxycycline 100mg    Allergies:  No Known Allergies  Medications: Outpatient Encounter Medications as of 04/17/2020  Medication Sig Note  . albuterol (VENTOLIN HFA) 108 (90 Base) MCG/ACT inhaler Inhale 2 puffs into the lungs every 4 (four) hours as needed for  wheezing or shortness of breath.   [name] amLODipine (NORVASC) 2.5 MG tablet Take 1 tablet (2.5 mg total) by mouth daily.   [name] aspirin 81 MG tablet Take 1 tablet (81 mg total) by mouth daily.   [name] azelastine (ASTELIN) 0.1 % nasal spray Place 2 sprays into both nostrils at bedtime as needed for rhinitis. Use in each nostril as directed   . budesonide-formoterol (SYMBICORT) 160-4.5 MCG/ACT inhaler Take 2 puffs first thing in am and then another 2 puffs about 12 hours later.   . clopidogrel (PLAVIX) 75 MG tablet Take 1 tablet (75 mg total) by mouth daily.   . fluticasone (FLONASE) 50 MCG/ACT nasal spray Place 2 sprays into both nostrils daily as needed for allergies or rhinitis.   [name] glucose blood (FREESTYLE TEST STRIPS) test strip Check blood sugar no more than twice daily   . isosorbide mononitrate (IMDUR) 30 MG 24 hr tablet Take 1 tablet (30 mg total) by mouth 2 (two) times daily.   . meclizine (ANTIVERT) 12.5 MG tablet Take 1 tablet (12.5 mg total) 3 (three) times daily as needed by mouth for dizziness. 03/19/2020: PRN  . metFORMIN (GLUCOPHAGE) 500 MG tablet Take 1 tablet (500 mg total) by mouth daily with breakfast.   . Misc. Devices (ACAPELLA) MISC Use as directed   . Multiple Vitamin (MULTIVITAMIN WITH MINERALS) TABS tablet Take 1 tablet by mouth daily.   . nitroGLYCERIN (NITROSTAT) 0.4 MG SL tablet Place 1 tablet (0.4 mg total) under the tongue every 5 (five) minutes as needed for chest pain (up to 3 doses only.). (Patient not taking: Reported on 03/19/2020) 10/18/2019: Hasn't  had to use, but has it available  . pravastatin (PRAVACHOL) 40 MG tablet Take 1 tablet (40 mg total) by mouth daily.   [name] telmisartan (MICARDIS) 20 MG tablet Take 1 tablet (20 mg total) by mouth daily.    No facility-administered encounter medications on file as of 04/17/2020.    Current Diagnosis: Patient Active Problem List   Diagnosis Date Noted  . SCC (squamous cell carcinoma) 10/16/2019  . Diabetic neuropathy ([place])  05/23/2019  . Hypertension, essential 09/01/2018  . COPD exacerbation ([place]) 07/13/2016  . Sinusitis, chronic 11/08/2015  . Follow-up --------------PCP NOTES 05/02/2015  . Vertigo 06/26/2014  . Palpitations 08/29/2013  . Hoarseness of voice 06/17/2011  . Annual physical exam 05/26/2011  . BUNDLE BRANCH BLOCK, RIGHT 01/10/2009  . Allergic rhinitis 08/16/2007  . Type 2 diabetes, controlled, with retinopathy ([place]) 06/30/2007  . Hyperlipidemia 06/30/2007  . CAD (coronary artery disease) 06/30/2007  . Dissection of aorta ([place]) 06/30/2007  . COPD GOLD III 06/30/2007    Goals Addressed   None    Reviewed chart prior to disease state call. Spoke with patient regarding BP  Recent Office Vitals: BP Readings from Last 3 Encounters:  03/19/20 (!) 135/78  12/13/19 (!) 151/31  12/04/19 132/60   Pulse Readings from Last 3 Encounters:  03/19/20 74  12/13/19 72  12/04/19 72    Wt Readings from Last 3 Encounters:  03/19/20 168 lb 2 oz (76.3 kg)  12/13/19 172 lb (78 kg)  12/04/19 172 lb (78 kg)     Kidney Function Lab Results  Component Value Date/Time   CREATININE 1.11 12/13/2019 03:26 PM   CREATININE 0.91 08/09/2019 09:30 AM   GFR 62.58 12/13/2019 03:26 PM   GFRNONAA >60 08/28/2015 11:40 AM   GFRAA >60 08/28/2015 11:40 AM    BMP Latest Ref Rng & Units 12/13/2019 08/09/2019 02/07/2019  Glucose 70 - 99 mg/dL 82 122(H) 75  BUN 6 - 23 mg/dL 15 13 16   Creatinine 0.40 - 1.50 mg/dL 1.11 0.91 0.99  Sodium 135 - 145 mEq/L 138 132(L) 135  Potassium 3.5 - 5.1 mEq/L 5.2 No hemolysis seen(H) 5.2 No hemolysis seen(H) 5.2 No hemolysis seen(H)  Chloride 96 - 112 mEq/L 98 96 99  CO2 19 - 32 mEq/L 32 32 25  Calcium 8.4 - 10.5 mg/dL 9.7 9.9 9.7    . Current antihypertensive regimen:   dulera 100-27mcg 2 puffs twice daily, albuterol 2 puffs Q4H PRN . How often are you checking your Blood Pressure? 3-5x per week . Current home BP readings: patient played his automated BP machine aloud for me  to hear, and the average was 132/72 . [name] What recent interventions/DTPs have been made by any provider to improve Blood Pressure control since last CPP Visit: Patient was advised by PCP to check blood pressure weekly at the least. . Any recent hospitalizations or ED visits since last visit with CPP? No . What diet changes have been made to improve Blood Pressure Control?  o Patient did not indicate any diet changes. . What exercise is being done to improve your Blood Pressure Control?  o Patient states he does not get much exercise with his COPD and the heat.  Adherence Review: Is the patient currently on ACE/ARB medication? Yes Does the patient have >5 day gap between last estimated fill dates? No   . Current COPD regimen: dulera 100-37mcg 2 puffs twice daily, albuterol 2 puffs Q4H PRN . No flowsheet data found. . Any recent hospitalizations or ED visits  since last visit with CPP? No . Reports COPD symptoms, including Increased shortness of breath  and Wheezing . What recent interventions/DTPs have been made by any provider to improve breathing since last visit: . Have you had exacerbation/flare-up since last visit? Yes . What do you do when you are short of breath?  Rescue medication  Respiratory Devices/Equipment . Do you have a nebulizer? Yes . Do you use a Peak Flow Meter? No . Do you use a maintenance inhaler? Yes . How often do you forget to use your daily inhaler? Never. Patient states he uses inhaler twice a  . Do you use a rescue inhaler? Yes . How often do you use your rescue inhaler?  3-5x times per week . Do you use a spacer with your inhaler? No  Patient reported to have increased hoarseness lately.  Adherence Review: . Does the patient have >5 day gap between last estimated fill date for maintenance inhaler medications? Yes Dulera last filled 11/21/19 for 30 DS per dispense report (getting through PAP?)  Follow-Up:  Scheduled Follow-Up With Clinical Pharmacist on  Monday September 27th @ 12:30pm.  Fanny Skates, [place] Pharmacist Assistant [phone]  Reviewed by: De Blanch, PharmD Clinical Pharmacist [facility] [phone]

## 2020-04-22 ENCOUNTER — Ambulatory Visit: Payer: PPO | Admitting: Podiatry

## 2020-04-22 ENCOUNTER — Other Ambulatory Visit: Payer: Self-pay

## 2020-04-22 DIAGNOSIS — E0843 Diabetes mellitus due to underlying condition with diabetic autonomic (poly)neuropathy: Secondary | ICD-10-CM | POA: Diagnosis not present

## 2020-04-22 DIAGNOSIS — E1142 Type 2 diabetes mellitus with diabetic polyneuropathy: Secondary | ICD-10-CM

## 2020-04-22 DIAGNOSIS — M21961 Unspecified acquired deformity of right lower leg: Secondary | ICD-10-CM

## 2020-04-22 DIAGNOSIS — L97512 Non-pressure chronic ulcer of other part of right foot with fat layer exposed: Secondary | ICD-10-CM

## 2020-04-22 NOTE — Progress Notes (Signed)
  Subjective:  Patient ID: Phillip Jordan, male    DOB: [birth year],  MRN: [id number]  Chief Complaint  Patient presents with  . Diabetic Ulcer    3 week follow up right foot    86 y.o. male presents with the above complaint. History confirmed with patient. Has been applying gentamicin cream daily  Objective:  Physical Exam: warm, good capillary refill, no trophic changes or ulcerative lesions and normal DP and PT pulses.    Right Foot: Rigid plantarflexed first ray with submetatarsal 1 ulceration measuring 0.3 cm x 0.6 cm x 0.2 cm with surrounding hyperkeratosis, minimal drainage, no cellulitis or purulence or signs of deep infection.  No exposed bone tendon or capsule.      Assessment:   1. Ulcer of right foot with fat layer exposed ([place])   2. Diabetes mellitus due to underlying condition with diabetic autonomic neuropathy, unspecified whether long term insulin use ([place])   3. Diabetic polyneuropathy associated with type 2 diabetes mellitus ([place])   4. Metatarsal deformity, right      Plan:  Patient was evaluated and treated and all questions answered.   Patient educated on diabetes. Discussed proper diabetic foot care and discussed risks and complications of disease. Educated patient in depth on reasons to return to the office immediately should he/she discover anything concerning or new on the feet. All questions answered. Discussed proper shoes as well.   Ulcer right foot -Debridement as below. -Dressed with Iodosorb, adhesive bandage. -Continue off-loading with diabetic shoe and multidensity insert.  I added a dancers pad with a cut out for the metatarsal area of the ulceration.  Procedure: Excisional Debridement of Wound Rationale: Removal of non-viable soft tissue from the wound to promote healing.  Anesthesia: none Pre-Debridement Wound Measurements: 0.3 cm x 0.6 cm x 0.2 cm  Post-Debridement Wound Measurements: Same as predebridement Type of Debridement: Sharp  selective Tissue Removed: Non-viable soft tissue Depth of Debridement: subcutaneous tissue. Technique: Sharp selective debridement to bleeding, viable wound base.  Dressing: Dry, sterile, compression dressing. Disposition: Patient tolerated procedure well.   Return in about 3 weeks (around 05/13/2020) for diabetic ulcer with Dr Amalia Hailey.

## 2020-04-25 ENCOUNTER — Other Ambulatory Visit: Payer: Self-pay | Admitting: Internal Medicine

## 2020-05-02 ENCOUNTER — Other Ambulatory Visit: Payer: Self-pay | Admitting: Internal Medicine

## 2020-05-06 ENCOUNTER — Encounter: Payer: Self-pay | Admitting: Internal Medicine

## 2020-05-06 ENCOUNTER — Ambulatory Visit: Payer: PPO | Admitting: Internal Medicine

## 2020-05-06 ENCOUNTER — Other Ambulatory Visit: Payer: Self-pay

## 2020-05-06 DIAGNOSIS — J449 Chronic obstructive pulmonary disease, unspecified: Secondary | ICD-10-CM

## 2020-05-06 NOTE — Progress Notes (Signed)
Subjective:     Patient ID: Phillip Jordan, male    DOB: [birth year]   MRN: [id number]    Brief patient profile:  86  year old white  male, ex-smoker (1ppd x 30 years, quit 1990s), diagnosed with COPD per hx by Dr [name] (fev1 1.38L/44%, ratio 51 in Jan 2009). C/w GOLD III  Criteria    History of Present Illness  05/25/2013 consult/ [name] re: sob/ prev eval MR with GOLD III COPD Chief Complaint  Patient presents with  . Pulmonary Consult    Referred per Dr. Kathlene November. The pt c/o DOE for the past 15 years, but worse for the past 6 months. He states that he gets SOB with walking up hills, but does okay with his exercise program 2-3 times per wk. He also c/o hoarseness and cough "for a long time"- cough is prod in the am with minimal clear sputum.      All his days are about the same in terms of activity tolerance  Breathing got better p quit smoking then worse again starting around mid 1990s Better on dulera, no better on spiriva ? Worse hoarseness so stopped it Work on inhaler technique:    Pantoprazole (protonix) 40 mg   Take 30-60 min before first meal of the day and Pepcid 20 mg one bedtime until return to office - this is the best way to tell whether stomach acid is contributing to your problem.  GERD diet    03/12/2017  f/u ov/Eliz Nigg re:   COPD  III dulera 200 / singulair/ gerd rx  Chief Complaint  Patient presents with  . Follow-up    Breathing has improved back to his normal baseline. No new co's. He has not had to use rescue inhaler.    doe still = mmrc3  rec Plan A = Automatic = Dulera 200 Take 2 puffs first thing in am and then another 2 puffs about 12 hours later Also  Pantoprazole (protonix) 40 mg   Take  30-60 min before first meal of the day and Pepcid (famotidine)  20 mg one @  bedtime until return to office - this is the best way to tell whether stomach acid is contributing to your problem.   Plan B = Backup Only use your albuterol as a rescue medication to be used if  you can't catch your breath by resting or doing a relaxed purse lip breathing pattern.  - The less you use it, the better it will work when you need it. - Ok to use the inhaler up to 2 puffs  every 4 hours if you must but call for appointment if use goes up over your usual need - Don't leave home without it !!  (think of it like the spare tire for your car)  Whenever coughing / wheezing/ short of breath >  Take protonix Take 30- 60 min before your first and last meals of the day and once you are better reduce back to protonix 40 mg Take 30-60 min before first meal of the day  Plan C= Crisis If needing the albuterol (proair) more than a few times a week> Prednisone 10 mg take  4 each am x 2 days,   2 each am x 2 days,  1 each am x 2 days and stop     06/14/2017  f/u ov/Emme Rosenau re: copd III on dulera 200/ singulair/ gerd rx/ pred prn used x one cycle since last ov Chief Complaint  Patient presents with  .  Follow-up    He states his breathing is doing well. He rarely uses his albuterol inhaler.    doe improved to  [facility] = can't walk a nl pace on a flat grade s sob but does fine slow and flat eg does fine walking slow anywhere and also [facility]  Sleeps ok  Watery rhinitis worse p meals / not sure singulair helping rec Ok to try off singulair to see if worse nasal or lower respiratory symptoms and if so restart     03/17/2019  f/u ov/Taila Basinski re: copd II/ maint on dulera 200 and pepcid hs for ? Noct gerd  Chief Complaint  Patient presents with  . Follow-up    Breathing is overall doing well but he is not exercising much. He uses his rescue inhaler about once every other wk.   Dyspnea:  Gym closed / some walking in the street, did not do  as well in [place] but still did 45 min s stopping, slow pace = MMRC2 = can't walk a nl pace on a flat grade s sob but does fine slow and flat  Cough: no change am cough/ congesiton >min mucoid sputum Sleeping: bed flat/ one pillow  SABA  use: rare  rec Ok to try off the famotidine to see if it makes any difference with your congestion in am after you try  the bed blocks for a month   GERD diet   05/11/2019  f/u ov/Jakyiah Briones re:  Copd GOLD II/ maint on dulera 200   doing better noct and in am since bed blocks and pepcid at hs (never stopped)  Chief Complaint  Patient presents with  . Follow-up    had round of pred 2 wks ago- SOB and hoarseness has improved. He is using bed blocks and this has helped.    Dyspnea:  MMRC2 = can't walk a nl pace on a flat grade s sob but does fine slow and flat  Cough: better, less congestion esp in am  Sleeping: ok on bed blocks  SABA use: rarely saba  02: no rec Stop pepcid / continue bed blocks   televist  08/28/19 rec pred x 6 days and off   12/04/2019  f/u ov/Rudie Rikard re:  GOLD II/ maint dulera 200  Chief Complaint  Patient presents with  . Follow-up    Breathing is "not bad"- wants to discuss switching from [facility] to Symbicort due to cost. He uses his albuterol inhaler once per wk on average.   Dyspnea:  MMRC2 = can't walk a nl pace on a flat grade s sob but does fine slow and flat / not very active  Cough: none  Sleeping: better, no pepcid hs  SABA use: rarely  02: rarely  rec symbicort 160 = dulera 200 either way dose is  Take 2 puffs first thing in am and then another 2 puffs about 12 hours later.   05/06/2020  f/u ov/Wilho Sharpley re: GOLD II/ maint on symbicort 160 2bid per [facility]  Chief Complaint  Patient presents with  . Follow-up    "doing okay". reports albuterol use once last week,  Dyspnea:  No change = MMRC 2 but limited by R foot s/p surgery  Cough: rarely/ some hoarseness  Sleeping: bed blocks  SABA use: proair once a week 02: no 02    No obvious day to day or daytime variability or assoc excess/ purulent sputum or mucus plugs or hemoptysis or cp or chest tightness, subjective  wheeze or overt sinus or hb symptoms.   Sleeping as above without nocturnal  or early am exacerbation   of respiratory  c/o's or need for noct saba. Also denies any obvious fluctuation of symptoms with weather or environmental changes or other aggravating or alleviating factors except as outlined above   No unusual exposure hx or h/o childhood pna/ asthma or knowledge of premature birth.  Current Allergies, Complete Past Medical History, Past Surgical History, Family History, and Social History were reviewed in [employer] record.  ROS  The following are not active complaints unless bolded Hoarseness, sore throat, dysphagia, dental problems, itching, sneezing,  nasal congestion or discharge of excess mucus or purulent secretions, ear ache,   fever, chills, sweats, unintended wt loss or wt gain, classically pleuritic or exertional cp,  orthopnea pnd or arm/hand swelling  or leg swelling, presyncope, palpitations, abdominal pain, anorexia, nausea, vomiting, diarrhea  or change in bowel habits or change in bladder habits, change in stools or change in urine, dysuria, hematuria,  rash, arthralgias, visual complaints, headache, numbness, weakness or ataxia or problems with walking or coordination,  change in mood or  memory.        Current Meds  Medication Sig  . albuterol (VENTOLIN HFA) 108 (90 Base) MCG/ACT inhaler Inhale 2 puffs into the lungs every 4 (four) hours as needed for wheezing or shortness of breath.  [name] amLODipine (NORVASC) 2.5 MG tablet Take 1 tablet (2.5 mg total) by mouth daily.  [name] aspirin 81 MG tablet Take 1 tablet (81 mg total) by mouth daily.  [name] azelastine (ASTELIN) 0.1 % nasal spray Place 2 sprays into both nostrils at bedtime as needed for rhinitis. Use in each nostril as directed  . budesonide-formoterol (SYMBICORT) 160-4.5 MCG/ACT inhaler Take 2 puffs first thing in am and then another 2 puffs about 12 hours later.  . clopidogrel (PLAVIX) 75 MG tablet Take 1 tablet (75 mg total) by mouth daily.  . fluticasone (FLONASE) 50 MCG/ACT nasal spray Place 2 sprays  into both nostrils daily as needed for allergies or rhinitis.  [name] glucose blood (FREESTYLE TEST STRIPS) test strip Check blood sugar no more than twice daily  . isosorbide mononitrate (IMDUR) 30 MG 24 hr tablet Take 1 tablet (30 mg total) by mouth 2 (two) times daily.  . meclizine (ANTIVERT) 12.5 MG tablet Take 1 tablet (12.5 mg total) 3 (three) times daily as needed by mouth for dizziness.  . metFORMIN (GLUCOPHAGE) 500 MG tablet Take 1 tablet (500 mg total) by mouth daily with breakfast.  . Misc. Devices (ACAPELLA) MISC Use as directed  . Multiple Vitamin (MULTIVITAMIN WITH MINERALS) TABS tablet Take 1 tablet by mouth daily.  . nitroGLYCERIN (NITROSTAT) 0.4 MG SL tablet Place 1 tablet (0.4 mg total) under the tongue every 5 (five) minutes as needed for chest pain (up to 3 doses only.).  [name] pravastatin (PRAVACHOL) 40 MG tablet Take 1 tablet (40 mg total) by mouth daily.  [name] telmisartan (MICARDIS) 20 MG tablet Take 1 tablet (20 mg total) by mouth daily.             Objective:   Physical Exam   amb pleasant elderly wm nad   05/06/2020        168  12/04/2019         172  05/11/2019         174  03/17/2019         179  09/15/2018  178  06/15/2018       173  12/13/2017         175  06/14/2017       173  03/12/2017         170  01/15/2017         172  11/30/2016           175    Vital signs reviewed  05/06/2020  - Note at rest 02 sats  98% on RA      Reports upper and lower partials     HEENT : pt wearing mask not removed for exam due to covid -19 concerns.    NECK :  without JVD/Nodes/TM/ nl carotid upstrokes bilaterally   LUNGS: no acc muscle use,  Mod barrel  contour chest wall with bilateral  Distant bs s audible wheeze and  without cough on insp or exp maneuvers and mod  Hyperresonant  to  percussion bilaterally     CV:  RRR  no s3 or murmur or increase in P2, and no edema   ABD:  soft and nontender with pos mid insp Hoover's  in the supine position. No bruits or organomegaly  appreciated, bowel sounds nl  MS:     ext warm without deformities, calf tenderness, cyanosis or clubbing No obvious joint restrictions   SKIN: warm and dry without lesions    NEURO:  alert, approp, nl sensorium with  no motor or cerebellar deficits apparent.            Assessment & Plan:

## 2020-05-06 NOTE — Patient Instructions (Signed)
No change medications   Please schedule a follow up visit in 12  months but call sooner if needed    

## 2020-05-07 ENCOUNTER — Encounter: Payer: Self-pay | Admitting: Internal Medicine

## 2020-05-07 NOTE — Assessment & Plan Note (Signed)
Quit smoking in 1994   - PFT's 07/02/11  FEV1  1.41 (55%) ratio 66 and 12% better p B2 with DLCO 96% - PFTs  07/05/2013 FEV1  1.05 and 1.31 p B2 (24%) with DLCO 59 corrects to 84%  - Spiriva and other mdi result in hoarseness - hfa 05/25/2013 75% p coaching>  90% 07/05/13  - added gerd rx 05/25/2013 >> improved 07/05/13  - rx dulera 200 2bid 07/05/13 > better 10/03/2013   - 10/08/2015 added GERD rx back due to pm strangling/ hoarseness  - 10/25/2015 added flutter - Rehab completed Aug 2017  - PFT's  05/21/2016  FEV1 1.23 (50 % ) ratio 45  p 32 % improvement from saba p dulera 200 prior to study with DLCO  56/57 % corrects to 72  % for alv volume    - added pred x 6 days as backup plan prn as of 03/12/2017   - 09/15/2018  After extensive coaching inhaler device,  effectiveness =    90%   AB pattern well controlled on symbicort 160 2bid          Each maintenance medication was reviewed in detail including emphasizing most importantly the difference between maintenance and prns and under what circumstances the prns are to be triggered using an action plan format where appropriate.  Total time for H and P, chart review, counseling, teaching device and generating customized AVS unique to this office visit / charting = 12 min

## 2020-05-08 ENCOUNTER — Ambulatory Visit: Payer: PPO | Admitting: Podiatry

## 2020-05-13 ENCOUNTER — Ambulatory Visit (INDEPENDENT_AMBULATORY_CARE_PROVIDER_SITE_OTHER): Payer: PPO | Admitting: Podiatry

## 2020-05-13 ENCOUNTER — Other Ambulatory Visit: Payer: Self-pay

## 2020-05-13 DIAGNOSIS — L97512 Non-pressure chronic ulcer of other part of right foot with fat layer exposed: Secondary | ICD-10-CM

## 2020-05-13 NOTE — Progress Notes (Signed)
   Subjective:  86 y.o. male  presenting today for recurrence of an ulcer that is developed to the plantar aspect of the right first MTPJ.  Patient has a very high arch foot that causes a lot of pressure to the area.  He normally comes in for routine foot care however his developed some bleeding and discoloration around the area.  He has been applying antibiotic cream and a Band-Aid to the area.  He is also been wearing his diabetic shoes and insoles with an offloading felt pad to the insole.  No new complaints at this time  Past Medical History:  Diagnosis Date  . Allergic rhinitis   . Aortic dissection ([place])    f/u by cards - AAA with distal dissection.  [name] CAD (coronary artery disease)    a. s/p MI 1992. b. Prior hx stenting to RCA/Cx. b. 05/2013: [place] s/p DES to distal RCA then staged DES to prox LAD.   [name] COPD with asthma ([place])   . Diabetes mellitus   . Hyperlipidemia   . Lichen planus    Right midline inferior chest, Dr. [name] Sensing  . SCC (squamous cell carcinoma)   . Shingles       Objective/Physical Exam General: The patient is alert and oriented x3 in no acute distress.  Dermatology:  Wound #1 noted to the 0.4x0.4 x 0.1 cm (LxWxD).   To the noted ulceration(s), there is no eschar. There is a moderate amount of slough, fibrin, and necrotic tissue noted. Granulation tissue and wound base is red. There is a minimal amount of serosanguineous drainage noted. There is no exposed bone muscle-tendon ligament or joint. There is no malodor. Periwound integrity is intact. Skin is warm, dry and supple bilateral lower extremities.  Vascular: Palpable pedal pulses bilaterally. No edema or erythema noted. Capillary refill within normal limits.  Neurological: Epicritic and protective threshold diminished bilaterally.   Musculoskeletal Exam: Range of motion within normal limits to all pedal and ankle joints bilateral. Muscle strength 5/5 in all groups bilateral.   Assessment: 1.  Ulcer  subfirst MTPJ right foot secondary to diabetes mellitus  Plan of Care:  1. Patient was evaluated. 2. medically necessary excisional debridement including subcutaneous tissue was performed using a tissue nipper and a chisel blade. Excisional debridement of all the necrotic nonviable tissue down to healthy bleeding viable tissue was performed with post-debridement measurements same as pre-. 3. the wound was cleansed and dry sterile dressing applied. 4.  Continue gentamicin cream daily 5.  An additional offloading dancers felt pad was applied to the insole of the right shoe to offload the first MTPJ and ulcer site  6.  Patient is to return to clinic in 3 weeks.   [name], DPM [facility]  Dr. [name], [address]                                        [place], [place] [address]                Office [phone]  Fax [phone]

## 2020-05-16 ENCOUNTER — Ambulatory Visit: Payer: PPO | Admitting: Internal Medicine

## 2020-05-20 ENCOUNTER — Ambulatory Visit: Payer: PPO | Admitting: Pharmacist

## 2020-05-20 ENCOUNTER — Other Ambulatory Visit: Payer: Self-pay

## 2020-05-20 DIAGNOSIS — E11319 Type 2 diabetes mellitus with unspecified diabetic retinopathy without macular edema: Secondary | ICD-10-CM

## 2020-05-20 DIAGNOSIS — E785 Hyperlipidemia, unspecified: Secondary | ICD-10-CM

## 2020-05-20 DIAGNOSIS — I2510 Atherosclerotic heart disease of native coronary artery without angina pectoris: Secondary | ICD-10-CM

## 2020-05-20 DIAGNOSIS — I10 Essential (primary) hypertension: Secondary | ICD-10-CM

## 2020-05-20 NOTE — Patient Instructions (Addendum)
Visit Information  Goals Addressed            This Visit's Progress   . Chronic Care Management Pharmacy Care Plan       CARE PLAN ENTRY (see longitudinal plan of care for additional care plan information)  Current Barriers:  . Chronic Disease Management support, education, and care coordination needs related to COPD, DM, HTN, HLD, CAD, Vertigo  Hypertension BP Readings from Last 3 Encounters:  05/06/20 138/78  03/19/20 (!) 135/78  12/13/19 (!) 151/31   . Pharmacist Clinical Goal(s): o Over the next 180 days, patient will work with PharmD and providers to maintain BP goal <130/80 . Current regimen:  . Amlodipine 2.5mg  daily . Telmisartan 20mg  daily . Interventions: o Discussed checking BP if he becomes dizzy . Patient self care activities - Over the next 180 days, patient will: o Check BP when he becomes dizzy, document, and provide at future appointments o Ensure daily salt intake < 2300 mg/Starla Deller  Hyperlipidemia/CAD Lab Results  Component Value Date/Time   LDLCALC 87 08/09/2019 09:30 AM   . Pharmacist Clinical Goal(s): o Over the next 180 days, patient will work with PharmD and providers to achieve LDL goal < 70 . Current regimen:  . Pravastatin 40mg  daily . Aspirin 81mg  daily . Clopidogrel 75mg  daily . Isosorbide 30mg  twice daily . Nitroglycerin 0.4mg  as needed . Interventions: o Discussed risk/benefit of taking clopidogrel and aspirin . Patient self care activities - Over the next 180 days, patient will: o Discuss regimen clopidogrel and aspirin with cardiologist  Diabetes Lab Results  Component Value Date/Time   HGBA1C 6.5 12/13/2019 03:26 PM   HGBA1C 6.4 08/09/2019 09:30 AM   . Pharmacist Clinical Goal(s): o Over the next 180 days, patient will work with PharmD and providers to maintain A1c goal <7% . Current regimen:  o Metformin 500mg  daily . Interventions: o Discussed DM goals . Patient self care activities - Over the next 180 days, patient  will: o Maintain a1c less than 7%  Fatigue . Pharmacist Clinical Goal(s) o Over the next 180 days, patient will work with PharmD and providers to reduce symptoms associated with fatigue . Current regimen:  o None . Interventions: o Discussed possible causes of fatigue and labs to consider monitoring including: - TSH - Vitamin B12 - Vitamin D . Patient self care activities - Over the next 180 days, patient will: o Consider completing labs listed above per Dr. [name] judgement  Medication management . Pharmacist Clinical Goal(s): o Over the next 180 days, patient will work with PharmD and providers to maintain optimal medication adherence . Current pharmacy: [facility] . Interventions o Comprehensive medication review performed. o Continue current medication management strategy . Patient self care activities - Over the next 180 days, patient will: o Focus on medication adherence by filling and taking medications appropriately o Take medications as prescribed o Report any questions or concerns to PharmD and/or provider(s)  Please see past updates related to this goal by clicking on the "Past Updates" button in the selected goal          The patient verbalized understanding of instructions provided today and agreed to receive a mailed copy of patient instruction and/or educational materials.  Telephone follow up appointment with pharmacy team member scheduled for: 11/18/2020  De Blanch, PharmD Clinical Pharmacist [facility] [phone]

## 2020-05-20 NOTE — Chronic Care Management (AMB) (Signed)
Chronic Care Management Pharmacy  Name: ZAHEER WAGEMAN  MRN: [id number] DOB: 16-Mar-1930  Chief Complaint/ HPI  Archie Balboa,  86 y.o. , male presents for their Follow-Up CCM visit with the clinical pharmacist via telephone due to COVID-19 Pandemic.  PCP : Colon Branch, MD  Their chronic conditions include: COPD, DM, HTN, HLD, CAD, Vertigo  Office Visits: 03-19-2020: Visit w/ Dr. [name] - Patient presented in the office with Dr. [name] for a follow up . Patient previously reported a lump on the right lower abdomen. Since then, he reports no lumps, occasional discomfort when standing too long. Patient also reported ow BP a few days before OV. Doxycycline 183m tab and gentamicin cream 0.1% were discontinued.  12-13-2019: Visit w/ Dr. PLarose Kells- Patient presented in the office with Dr. PLarose Kellsc/o pain at the right hip, mostly when he walks. Patient's BP was 151/31 at the time of visit sitting. Patient was advised to check his BP weekly and take Tylenol as needed for pain. Blood work was ordered.  Consult Visit: 05/13/20: Podiatry visit w/ Dr. EAmalia Hailey- Ulcer of right foot with fat layer exposed. Medically necessary excisional debridement of necrotic tissue down to healthy bleeding viable tissues. Wound cleansed and dry sterile dressing applied. RTC 3 weeks.   05/06/20: Pulmonary visit w/ Dr. WMelvyn Novas- "05/06/2020  f/u ov/[place] re: GOLD II/ maint on symbicort [facility]"   04/22/20: Podiatry visit w/ Dr. MSherryle Lis- Ulcer of right foot with fat layer exposed. Excisional debridement of wound.  Medications: Outpatient Encounter Medications as of 05/20/2020  Medication Sig Note  . albuterol (VENTOLIN HFA) 108 (90 Base) MCG/ACT inhaler Inhale 2 puffs into the lungs every 4 (four) hours as needed for wheezing or shortness of breath.   .Marland KitchenamLODipine (NORVASC) 2.5 MG tablet Take 1 tablet (2.5 mg total) by mouth daily.   .Marland Kitchenaspirin 81 MG tablet Take 1 tablet (81 mg total) by mouth daily.   .Marland Kitchenazelastine (ASTELIN)  0.1 % nasal spray Place 2 sprays into both nostrils at bedtime as needed for rhinitis. Use in each nostril as directed   . budesonide-formoterol (SYMBICORT) 160-4.5 MCG/ACT inhaler Take 2 puffs first thing in am and then another 2 puffs about 12 hours later.   . clopidogrel (PLAVIX) 75 MG tablet Take 1 tablet (75 mg total) by mouth daily.   . fluticasone (FLONASE) 50 MCG/ACT nasal spray Place 2 sprays into both nostrils daily as needed for allergies or rhinitis.   .Marland Kitchenglucose blood (FREESTYLE TEST STRIPS) test strip Check blood sugar no more than twice daily   . isosorbide mononitrate (IMDUR) 30 MG 24 hr tablet Take 1 tablet (30 mg total) by mouth 2 (two) times daily.   . meclizine (ANTIVERT) 12.5 MG tablet Take 1 tablet (12.5 mg total) 3 (three) times daily as needed by mouth for dizziness. 03/19/2020: PRN  . metFORMIN (GLUCOPHAGE) 500 MG tablet Take 1 tablet (500 mg total) by mouth daily with breakfast.   . Misc. Devices (ACAPELLA) MISC Use as directed   . Multiple Vitamin (MULTIVITAMIN WITH MINERALS) TABS tablet Take 1 tablet by mouth daily.   . nitroGLYCERIN (NITROSTAT) 0.4 MG SL tablet Place 1 tablet (0.4 mg total) under the tongue every 5 (five) minutes as needed for chest pain (up to 3 doses only.). 10/18/2019: Hasn't had to use, but has it available  . pravastatin (PRAVACHOL) 40 MG tablet Take 1 tablet (40 mg total) by mouth daily.   .Marland Kitchentelmisartan (  MICARDIS) 20 MG tablet Take 1 tablet (20 mg total) by mouth daily.    No facility-administered encounter medications on file as of 05/20/2020.   Immunization History  Administered Date(s) Administered  . Fluad Quad(high Dose 65+) 05/11/2019  . Influenza Split 05/26/2011, 06/01/2012  . Influenza Whole 06/30/2007, 06/18/2008, 06/14/2009, 04/24/2010  . Influenza, High Dose Seasonal PF 06/19/2014, 05/02/2015, 06/08/2017, 05/16/2018  . Influenza,inj,Quad PF,6+ Mos 06/10/2013  . Influenza-Unspecified 05/21/2016, 05/16/2020  . Moderna SARS-COVID-2  Vaccination 09/05/2019, 10/03/2019  . Pneumococcal Conjugate-13 09/10/2015  . Pneumococcal Polysaccharide-23 08/24/2001, 06/30/2007, 06/01/2012  . Td 08/24/2005, 07/06/2016  . Zoster 10/13/2007  . Zoster Recombinat (Shingrix) 09/01/2018, 11/01/2018   SDOH Screenings   Alcohol Screen:   . Last Alcohol Screening Score (AUDIT): Not on file  Depression (PHQ2-9): Low Risk   . PHQ-2 Score: 0  Financial Resource Strain: Low Risk   . Difficulty of Paying Living Expenses: Not very hard  Food Insecurity:   . Worried About Charity fundraiser in the Last Year: Not on file  . Ran Out of Food in the Last Year: Not on file  Housing:   . Last Housing Risk Score: Not on file  Physical Activity:   . Days of Exercise per Week: Not on file  . Minutes of Exercise per Session: Not on file  Social Connections:   . Frequency of Communication with Friends and Family: Not on file  . Frequency of Social Gatherings with Friends and Family: Not on file  . Attends Religious Services: Not on file  . Active Member of Clubs or Organizations: Not on file  . Attends Archivist Meetings: Not on file  . Marital Status: Not on file  Stress:   . Feeling of Stress : Not on file  Tobacco Use: Medium Risk  . Smoking Tobacco Use: Former Smoker  . Smokeless Tobacco Use: Never Used  Transportation Needs:   . Film/video editor (Medical): Not on file  . Lack of Transportation (Non-Medical): Not on file     Current Diagnosis/Assessment:  Goals Addressed            This Visit's Progress   . Chronic Care Management Pharmacy Care Plan       CARE PLAN ENTRY (see longitudinal plan of care for additional care plan information)  Current Barriers:  . Chronic Disease Management support, education, and care coordination needs related to COPD, DM, HTN, HLD, CAD, Vertigo  Hypertension BP Readings from Last 3 Encounters:  05/06/20 138/78  03/19/20 (!) 135/78  12/13/19 (!) 151/31   . Pharmacist  Clinical Goal(s): o Over the next 180 days, patient will work with PharmD and providers to maintain BP goal <130/80 . Current regimen:  . Amlodipine 2.45m daily . Telmisartan 220mdaily . Interventions: o Discussed checking BP if he becomes dizzy . Patient self care activities - Over the next 180 days, patient will: o Check BP when he becomes dizzy, document, and provide at future appointments o Ensure daily salt intake < 2300 mg/Bali Lyn  Hyperlipidemia/CAD Lab Results  Component Value Date/Time   LDLCALC 87 08/09/2019 09:30 AM   . Pharmacist Clinical Goal(s): o Over the next 180 days, patient will work with PharmD and providers to achieve LDL goal < 70 . Current regimen:  . Pravastatin 4079maily . Aspirin 56m2mily . Clopidogrel 75mg61mly . Isosorbide 30mg 89me daily . Nitroglycerin 0.4mg as21meded . Interventions: o Discussed risk/benefit of taking clopidogrel and aspirin . Patient self care activities -  Over the next 180 days, patient will: o Discuss regimen clopidogrel and aspirin with cardiologist  Diabetes Lab Results  Component Value Date/Time   HGBA1C 6.5 12/13/2019 03:26 PM   HGBA1C 6.4 08/09/2019 09:30 AM   . Pharmacist Clinical Goal(s): o Over the next 180 days, patient will work with PharmD and providers to maintain A1c goal <7% . Current regimen:  o Metformin 550m daily . Interventions: o Discussed DM goals . Patient self care activities - Over the next 180 days, patient will: o Maintain a1c less than 7%  Fatigue . Pharmacist Clinical Goal(s) o Over the next 180 days, patient will work with PharmD and providers to reduce symptoms associated with fatigue . Current regimen:  o None . Interventions: o Discussed possible causes of fatigue and labs to consider monitoring including: - TSH - Vitamin B12 - Vitamin D . Patient self care activities - Over the next 180 days, patient will: o Consider completing labs listed above per Dr. PLarose Kells judgement  Medication management . Pharmacist Clinical Goal(s): o Over the next 180 days, patient will work with PharmD and providers to maintain optimal medication adherence . Current pharmacy: [facility] . Interventions o Comprehensive medication review performed. o Continue current medication management strategy . Patient self care activities - Over the next 180 days, patient will: o Focus on medication adherence by filling and taking medications appropriately o Take medications as prescribed o Report any questions or concerns to PharmD and/or provider(s)  Please see past updates related to this goal by clicking on the "Past Updates" button in the selected goal         Married to [place] 65 years. 3 children (daughter lives in [place], all married. 9 grandchildren, oldest grandchild is 352 lost one grandson 2 years ago to suicide. 4 grand-dogs. Live in a town home.   COPD    Last spirometry score: 05/21/2016  FEV1 = 37% (0.93L) FVC = 62% (2.22 L) FEV1/FVC = 42%  Gold Grade: Gold 3 (FEV1 30-49%) (per pulmonology assessment) Current COPD Classification:  A (low sx, <2 exacerbations/yr)  Eosinophil count:   Lab Results  Component Value Date/Time   EOSPCT 3.2 12/13/2019 03:26 PM  %                               Eos (Absolute):  Lab Results  Component Value Date/Time   EOSABS 0.3 12/13/2019 03:26 PM    Tobacco Status:  Social History   Tobacco Use  Smoking Status Former Smoker  . Packs/[place] Early: 1.50  . Years: 39.00  . Pack years: 58.50  . Types: Cigarettes  . Quit date: 08/24/1992  . Years since quitting: 27.7  Smokeless Tobacco Never Used    Patient has failed these meds in past: None noted (symbicort, spiriva listed in D/C meds. No apparent reason for D/C) Patient is currently controlled on the following medications:   Symbicort 160 2 puffs twice daily   Albuterol 2 puffs Q4H PRN Using maintenance inhaler regularly? Yes Frequency of rescue inhaler use:  1-2x per  week   Acapella device hasn't used in a while  Dulera: Pt states that [employer] says they have a limited amount to provide through PAP. Got it through "simple fill" in the past for $40 Currently paying $90/month [employer] to spin off Dulera to new company per a letter patient recent (Engineer, structural Wife to call and give [facility] [id number]  [employer] PAP Confirmation Number: [id number] [employer] PAP Phone number: [phone]  We discussed:  proper inhaler technique   Update 05/20/20 Now using Symbicort? Yes Affordable at [facility]? Yes, free States his breathing is good, but his energy/stamina is decreased.  Plans to get COVID booster in October  Plan -Continue current medications   Diabetes   A1c goal <7%  Recent Relevant Labs: Lab Results  Component Value Date/Time   HGBA1C 6.5 12/13/2019 03:26 PM   HGBA1C 6.4 08/09/2019 09:30 AM   GFR 62.58 12/13/2019 03:26 PM   GFR 78.76 08/09/2019 09:30 AM   MICROALBUR 2.8 (H) 07/06/2016 11:32 AM   MICROALBUR 2.5 (H) 05/02/2015 11:06 AM    Last diabetic Eye exam:  Lab Results  Component Value Date/Time   HMDIABEYEEXA Retinopathy (A) 06/12/2019 12:00 AM    Last diabetic Foot exam:  Lab Results  Component Value Date/Time   HMDIABFOOTEX Done/Abnormal 05/02/2015 12:00 AM     Checking BG: Rarely  Recent FBG Readings: 127 this morning  Patient has failed these meds in past: None noted  Patient is currently controlled on the following medications: [name] Metformin 564m daily  We discussed: DM goals  Plan -Continue current medications   Hypertension   BP goal is:  <130/80  Office blood pressures are  BP Readings from Last 3 Encounters:  05/06/20 138/78  03/19/20 (!) 135/78  12/13/19 (!) 151/31   Patient checks BP at home 1-2x per week Patient home BP readings are ranging: 3 Dezarae Mcclaran Avg: 116/70  Patient has failed these meds in the past: None noted  Patient is currently controlled on the following medications:   . Amlodipine 2.553mdaily . Telmisartan 2022maily   Denies chest pains, headaches.  Reports occasional dizziness due to balance issues.  Encouraged patient to check BP when he becomes dizzy to see if hypotension is the cause of dizziness He wonders if his BP meds should be adjusted.  We discussed BP goals and importance of checking BP at home and providing at doctor's office visits  Plan -Continue current medications  -Consider checking BP when/if you become dizzy    Hyperlipidemia/CAD   LDL goal < 70  Lipid Panel     Component Value Date/Time   CHOL 152 08/09/2019 0930   TRIG 117.0 08/09/2019 0930   TRIG 181 09/06/2009 1602   HDL 41.00 08/09/2019 0930   LDLCALC 87 08/09/2019 0930    Hepatic Function Latest Ref Rng & Units 12/13/2019 08/09/2019 09/06/2018  Total Protein 6.0 - 8.3 g/dL 6.8 6.8 6.7  Albumin 3.5 - 5.2 g/dL 4.3 4.4 4.2  AST 0 - 37 U/L '15 15 13  ' ALT 0 - 53 U/L '12 16 16  ' Alk Phosphatase 39 - 117 U/L 90 90 94  Total Bilirubin 0.2 - 1.2 mg/dL 0.4 0.4 0.4  Bilirubin, Direct 0.0 - 0.3 mg/dL - - -     The ASCVD Risk score ([place] al., 2013) failed to calculate for the following reasons:   The 2013 ASCVD risk score is only valid for ages 40 5 79 72Patient has failed these meds in past: None noted  Patient is currently uncontrolled on the following medications:  . Pravastatin 22m43mily . Aspirin 81mg36mly . Clopidogrel 75mg 35my . Isosorbide 30mg t70m daily . Nitroglycerin 0.4mg as 44mded  Patient would benefit from updated lipid panel. He wonders about bruising while using clopidogrel and aspirin. Discussed risk/benefit of dual antiplatelet therapy noting his history of multiple MI. Combo  increases risk of bleed, but may be needed due to pt's high risk? Patient to discuss with cardio and/or Dr. [name].  We discussed:  LDL goal and importance of LDL goal  Plan -Continue current medications   Future Plan -If LDL remains >70 change statin or  increase current dose.  Fatigue    Patient has failed these meds in past: None noted  Patient is currently uncontrolled on the following medications: . None  Wonders what could be the cause of fatigue.  Wife wonders if patient can take a B12 supplement OTC. Advised that this would not be harmful, but may not be beneficial if not deficient. Pt also notes he takes a multivitamin that has B12 in it. We discussed possibility of getting monitored for the following: TSH B12 Vitamin D Will defer to Dr. [name] regarding which labs are appropriate to check during office visit on 06/19/20   Plan -Consult with Dr. [name] regarding possible causes of fatigue with consideration of monitoring TSH, B12, and Vitamin D   Vaccines   Reviewed and discussed patient's vaccination history.    Immunization History  Administered Date(s) Administered  . Fluad Quad(high Dose 65+) 05/11/2019  . Influenza Split 05/26/2011, 06/01/2012  . Influenza Whole 06/30/2007, 06/18/2008, 06/14/2009, 04/24/2010  . Influenza, High Dose Seasonal PF 06/19/2014, 05/02/2015, 06/08/2017, 05/16/2018  . Influenza,inj,Quad PF,6+ Mos 06/10/2013  . Influenza-Unspecified 05/21/2016, 05/16/2020  . Moderna SARS-COVID-2 Vaccination 09/05/2019, 10/03/2019  . Pneumococcal Conjugate-13 09/10/2015  . Pneumococcal Polysaccharide-23 08/24/2001, 06/30/2007, 06/01/2012  . Td 08/24/2005, 07/06/2016  . Zoster 10/13/2007  . Zoster Recombinat (Shingrix) 09/01/2018, 11/01/2018   States he received his flu vaccine at [place] on Friday or Saturday. Called pharmacy and confirmed patient received Fluzone HD on 05/16/[facility] at [facility]. Plans to get COVID booster in October  Plan -Update immunization record   Miscellaneous Meds Azelastine 0.1% uses a few times a week at night Multivitamin  De Blanch, PharmD Clinical Pharmacist [facility] at [facility] [phone]

## 2020-05-30 DIAGNOSIS — Z974 Presence of external hearing-aid: Secondary | ICD-10-CM | POA: Diagnosis not present

## 2020-05-30 DIAGNOSIS — H60332 Swimmer's ear, left ear: Secondary | ICD-10-CM | POA: Diagnosis not present

## 2020-05-30 DIAGNOSIS — H903 Sensorineural hearing loss, bilateral: Secondary | ICD-10-CM | POA: Diagnosis not present

## 2020-05-30 DIAGNOSIS — H6123 Impacted cerumen, bilateral: Secondary | ICD-10-CM | POA: Diagnosis not present

## 2020-06-04 ENCOUNTER — Telehealth: Payer: Self-pay | Admitting: Internal Medicine

## 2020-06-04 MED ORDER — PREDNISONE 10 MG PO TABS
ORAL_TABLET | ORAL | 0 refills | Status: AC
Start: 2020-06-04 — End: 2020-06-10

## 2020-06-04 NOTE — Telephone Encounter (Signed)
Patient's wife, Tessie Fass, called back.  I spoke with her as she is listed on the [facility].  Advised of Dr. Gustavus Bryant recommendations.  She verbalized understanding.  I verified pharmacy and script sent.  Amendment to triage note:  Patient having symptoms since Saturday, 06/01/2020.  Nothing further needed.

## 2020-06-04 NOTE — Telephone Encounter (Signed)
Prednisone 10 mg take  4 each am x 2 days,   2 each am x 2 days,  1 each am x 2 days and stop   Amend the hx to identify what Saturday is being referenced  ? 06/01/20 ?

## 2020-06-04 NOTE — Telephone Encounter (Signed)
Primary Pulmonologist: Wert Last office visit and with whom: 05/06/20 Wert What do we see them for (pulmonary problems): COPD Gold III Last OV assessment/plan:  Assessment & Plan Note by [name], MD at 05/07/2020 8:56 PM Author: [name], MD Author Type: Physician Filed: 05/07/2020 8:57 PM  Note Status: Written Cosign: Cosign Not Required Encounter Date: 05/06/2020  Problem: COPD GOLD III  Editor: [name], MD (Physician)                 Quit smoking in 1994   - PFT's 07/02/11  FEV1  1.41 (55%) ratio 66 and 12% better p B2 with DLCO 96% - PFTs  07/05/2013 FEV1  1.05 and 1.31 p B2 (24%) with DLCO 59 corrects to 84%  - Spiriva and other mdi result in hoarseness - hfa 05/25/2013 75% p coaching>  90% 07/05/13  - added gerd rx 05/25/2013 >> improved 07/05/13  - rx dulera 200 2bid 07/05/13 > better 10/03/2013   - 10/08/2015 added GERD rx back due to pm strangling/ hoarseness  - 10/25/2015 added flutter - Rehab completed Aug 2017  - PFT's  05/21/2016  FEV1 1.23 (50 % ) ratio 45  p 32 % improvement from saba p dulera 200 prior to study with DLCO  56/57 % corrects to 72  % for alv volume    - added pred x 6 days as backup plan prn as of 03/12/2017   - 09/15/2018  After extensive coaching inhaler device,  effectiveness =    90%   AB pattern well controlled on symbicort 160 2bid          Each maintenance medication was reviewed in detail including emphasizing most importantly the difference between maintenance and prns and under what circumstances the prns are to be triggered using an action plan format where appropriate.  Total time for H and P, chart review, counseling, teaching device and generating customized AVS unique to this office visit / charting = 12 min          Patient Instructions by [name], MD at 05/06/2020 1:45 PM Author: [name], MD Author Type: Physician Filed: 05/06/2020 1:58 PM  Note Status: Signed Cosign: Cosign Not Required Encounter  Date: 05/06/2020  Editor: [name], MD (Physician)                 No change medications    Please schedule a follow up visit in 12 months but call sooner if needed     Instructions  No change medications    Please schedule a follow up visit in 12 months but call sooner if needed        Was appointment offered to patient (explain)?  No, routing to Dr. [name] for recommendations.   Reason for call: wheezing since Saturday.  SOB with exertion, more than normal.  Sat are 94% on RA.  Using Albuterol as needed, gets relief.  Using Symbicort as prescribed.  Has had his covid vaccines.  Coughing more than normal, clear.  Denies any fever, chills, muscle aches.  Has not been around anyone with covid.  He is requesting prednisone.    (examples of things to ask: : When did symptoms start? Fever? Cough? Productive? Color to sputum? More sputum than usual? Wheezing? Have you needed increased oxygen? Are you taking your respiratory medications? What over the counter measures have you tried?)  No Known Allergies  Immunization History  Administered Date(s) Administered  . Fluad Quad(high  Dose 65+) 05/11/2019  . Influenza Split 05/26/2011, 06/01/2012  . Influenza Whole 06/30/2007, 06/18/2008, 06/14/2009, 04/24/2010  . Influenza, High Dose Seasonal PF 06/19/2014, 05/02/2015, 06/08/2017, 05/16/2018  . Influenza,inj,Quad PF,6+ Mos 06/10/2013  . Influenza-Unspecified 05/21/2016, 05/16/2020  . Moderna SARS-COVID-2 Vaccination 09/05/2019, 10/03/2019  . Pneumococcal Conjugate-13 09/10/2015  . Pneumococcal Polysaccharide-23 08/24/2001, 06/30/2007, 06/01/2012  . Td 08/24/2005, 07/06/2016  . Zoster 10/13/2007  . Zoster Recombinat (Shingrix) 09/01/2018, 11/01/2018

## 2020-06-04 NOTE — Telephone Encounter (Signed)
LMTCB for pt's spouse 

## 2020-06-13 DIAGNOSIS — H903 Sensorineural hearing loss, bilateral: Secondary | ICD-10-CM | POA: Diagnosis not present

## 2020-06-13 DIAGNOSIS — Z961 Presence of intraocular lens: Secondary | ICD-10-CM | POA: Diagnosis not present

## 2020-06-13 DIAGNOSIS — H524 Presbyopia: Secondary | ICD-10-CM | POA: Diagnosis not present

## 2020-06-13 DIAGNOSIS — E113291 Type 2 diabetes mellitus with mild nonproliferative diabetic retinopathy without macular edema, right eye: Secondary | ICD-10-CM | POA: Diagnosis not present

## 2020-06-13 DIAGNOSIS — Z8669 Personal history of other diseases of the nervous system and sense organs: Secondary | ICD-10-CM | POA: Diagnosis not present

## 2020-06-13 DIAGNOSIS — Z974 Presence of external hearing-aid: Secondary | ICD-10-CM | POA: Diagnosis not present

## 2020-06-13 DIAGNOSIS — H5203 Hypermetropia, bilateral: Secondary | ICD-10-CM | POA: Diagnosis not present

## 2020-06-13 LAB — HM DIABETES EYE EXAM

## 2020-06-14 ENCOUNTER — Encounter: Payer: Self-pay | Admitting: Internal Medicine

## 2020-06-17 ENCOUNTER — Other Ambulatory Visit: Payer: Self-pay

## 2020-06-17 ENCOUNTER — Ambulatory Visit: Payer: PPO | Admitting: Podiatry

## 2020-06-17 DIAGNOSIS — E0843 Diabetes mellitus due to underlying condition with diabetic autonomic (poly)neuropathy: Secondary | ICD-10-CM

## 2020-06-17 DIAGNOSIS — L97512 Non-pressure chronic ulcer of other part of right foot with fat layer exposed: Secondary | ICD-10-CM | POA: Diagnosis not present

## 2020-06-17 NOTE — Progress Notes (Signed)
   Subjective:  86 y.o. male  presenting today for follow-up evaluation of an ulcer to the plantar aspect of the right first MTPJ.  Patient has a very high arch foot that causes a lot of pressure to the area.  He normally comes in for routine foot care however his developed some bleeding and discoloration around the area.  He has been applying antibiotic cream and a Band-Aid to the area.  He is also been wearing his diabetic shoes and insoles with an offloading felt pad to the insole.  No new complaints at this time  Past Medical History:  Diagnosis Date  . Allergic rhinitis   . Aortic dissection ([place])    f/u by cards - AAA with distal dissection.  [name] CAD (coronary artery disease)    a. s/p MI 1992. b. Prior hx stenting to RCA/Cx. b. 05/2013: [place] s/p DES to distal RCA then staged DES to prox LAD.   [name] COPD with asthma ([place])   . Diabetes mellitus   . Hyperlipidemia   . Lichen planus    Right midline inferior chest, Dr. [name] Sensing  . SCC (squamous cell carcinoma)   . Shingles       Objective/Physical Exam General: The patient is alert and oriented x3 in no acute distress.  Dermatology:  Wound #1 noted to the 0.5 x 0.5 x 0.3 cm (LxWxD).   To the noted ulceration(s), there is no eschar. There is a moderate amount of slough, fibrin, and necrotic tissue noted. Granulation tissue and wound base is red. There is a minimal amount of serosanguineous drainage noted. There is no exposed bone muscle-tendon ligament or joint. There is no malodor. Periwound integrity is intact. Skin is warm, dry and supple bilateral lower extremities.  Vascular: Palpable pedal pulses bilaterally. No edema or erythema noted. Capillary refill within normal limits.  Neurological: Epicritic and protective threshold diminished bilaterally.   Musculoskeletal Exam: Range of motion within normal limits to all pedal and ankle joints bilateral. Muscle strength 5/5 in all groups bilateral.   Assessment: 1.  Ulcer subfirst  MTPJ right foot secondary to diabetes mellitus  Plan of Care:  1. Patient was evaluated. 2. medically necessary excisional debridement including subcutaneous tissue was performed using a tissue nipper and a chisel blade. Excisional debridement of all the necrotic nonviable tissue down to healthy bleeding viable tissue was performed with post-debridement measurements same as pre-. 3. the wound was cleansed and dry sterile dressing applied. 4.  Continue gentamicin cream daily 5.  An additional offloading dancers felt pad was applied to the insole of the right shoe to offload the first MTPJ and ulcer site  6.  Patient is to return to clinic in 3 weeks.   [name], DPM [facility]  Dr. [name], [address]                                        [place],  [address]                Office [phone]  Fax [phone]

## 2020-06-19 ENCOUNTER — Encounter: Payer: Self-pay | Admitting: Internal Medicine

## 2020-06-19 ENCOUNTER — Other Ambulatory Visit: Payer: Self-pay

## 2020-06-19 ENCOUNTER — Ambulatory Visit (INDEPENDENT_AMBULATORY_CARE_PROVIDER_SITE_OTHER): Payer: PPO | Admitting: Internal Medicine

## 2020-06-19 VITALS — BP 151/73 | HR 71 | Temp 98.1°F | Resp 18 | Ht 69.0 in | Wt 171.4 lb

## 2020-06-19 DIAGNOSIS — I10 Essential (primary) hypertension: Secondary | ICD-10-CM

## 2020-06-19 DIAGNOSIS — R5383 Other fatigue: Secondary | ICD-10-CM

## 2020-06-19 DIAGNOSIS — E1142 Type 2 diabetes mellitus with diabetic polyneuropathy: Secondary | ICD-10-CM

## 2020-06-19 DIAGNOSIS — E11319 Type 2 diabetes mellitus with unspecified diabetic retinopathy without macular edema: Secondary | ICD-10-CM | POA: Diagnosis not present

## 2020-06-19 DIAGNOSIS — E875 Hyperkalemia: Secondary | ICD-10-CM | POA: Diagnosis not present

## 2020-06-19 DIAGNOSIS — L97515 Non-pressure chronic ulcer of other part of right foot with muscle involvement without evidence of necrosis: Secondary | ICD-10-CM

## 2020-06-19 DIAGNOSIS — E785 Hyperlipidemia, unspecified: Secondary | ICD-10-CM | POA: Diagnosis not present

## 2020-06-19 MED ORDER — FREESTYLE TEST VI STRP: ORAL_STRIP | 12 refills | Status: AC

## 2020-06-19 NOTE — Progress Notes (Signed)
Pre visit review using our clinic review tool, if applicable. No additional management support is needed unless otherwise documented below in the visit note. 

## 2020-06-19 NOTE — Patient Instructions (Addendum)
Check the  blood pressure twice a month BP GOAL is between 110/65 and  135/85. If it is consistently higher or lower, let me know   GO TO THE LAB : Get the blood work     [place], [place] back for a physical exam in 4 months

## 2020-06-19 NOTE — Progress Notes (Signed)
Subjective:    Patient ID: Phillip Jordan, male    DOB: [birth year], 86 y.o.   MRN: [id number]  DOS:  06/19/2020 Type of visit - description: f/u His main concern today is fatigue, described simply as lack of energy. He denies chest pain. He continue with DOE, not much worse than his usual, able to walk 300 feet before he has to stop. Occasionally have cough and wheezing. No edema or palpitation.    Review of Systems See above   Past Medical History:  Diagnosis Date  . Allergic rhinitis   . Aortic dissection ([place])    f/u by cards - AAA with distal dissection.  [name] CAD (coronary artery disease)    a. s/p MI 1992. b. Prior hx stenting to RCA/Cx. b. 05/2013: [place] s/p DES to distal RCA then staged DES to prox LAD.   [name] COPD with asthma ([place])   . Diabetes mellitus   . Hyperlipidemia   . Lichen planus    Right midline inferior chest, Dr. [name] Sensing  . SCC (squamous cell carcinoma)   . Shingles     Past Surgical History:  Procedure Laterality Date  . BACK SURGERY  1993  . CARDIAC CATHETERIZATION     left, w/ coronary angiography and left ventriculograpy  . LEFT HEART CATHETERIZATION WITH CORONARY ANGIOGRAM N/A 06/09/2013   Procedure: LEFT HEART CATHETERIZATION WITH CORONARY ANGIOGRAM;  Surgeon: [name], MD;  Location: [facility] CATH LAB;  Service: Cardiovascular;  Laterality: N/A;  . PERCUTANEOUS CORONARY STENT INTERVENTION (PCI-S) N/A 06/12/2013   Procedure: PERCUTANEOUS CORONARY STENT INTERVENTION (PCI-S);  Surgeon: [name], MD;  Location: [facility] CATH LAB;  Service: Cardiovascular;  Laterality: N/A;  prox LAD  . PTCA     w/ placement of drugeluting stent in the distal right coronary artery, and distal Cardiologist: [name]    Allergies as of 06/19/2020   No Known Allergies     Medication List       Accurate as of June 19, 2020  1:21 PM. If you have any questions, ask your nurse or doctor.        Acapella Misc Use as directed   albuterol  108 (90 Base) MCG/ACT inhaler Commonly known as: VENTOLIN HFA Inhale 2 puffs into the lungs every 4 (four) hours as needed for wheezing or shortness of breath.   amLODipine 2.5 MG tablet Commonly known as: NORVASC Take 1 tablet (2.5 mg total) by mouth daily.   aspirin 81 MG tablet Take 1 tablet (81 mg total) by mouth daily.   azelastine 0.1 % nasal spray Commonly known as: ASTELIN Place 2 sprays into both nostrils at bedtime as needed for rhinitis. Use in each nostril as directed   budesonide-formoterol 160-4.5 MCG/ACT inhaler Commonly known as: Symbicort Take 2 puffs first thing in am and then another 2 puffs about 12 hours later.   clopidogrel 75 MG tablet Commonly known as: PLAVIX Take 1 tablet (75 mg total) by mouth daily.   fluticasone 50 MCG/ACT nasal spray Commonly known as: FLONASE Place 2 sprays into both nostrils daily as needed for allergies or rhinitis.   glucose blood test strip Commonly known as: FREESTYLE TEST STRIPS Check blood sugar no more than twice daily   isosorbide mononitrate 30 MG 24 hr tablet Commonly known as: IMDUR Take 1 tablet (30 mg total) by mouth 2 (two) times daily.   meclizine 12.5 MG tablet Commonly known as: ANTIVERT Take 1 tablet (12.5 mg total) 3 (three) times daily  as needed by mouth for dizziness.   metFORMIN 500 MG tablet Commonly known as: GLUCOPHAGE Take 1 tablet (500 mg total) by mouth daily with breakfast.   multivitamin with minerals Tabs tablet Take 1 tablet by mouth daily.   nitroGLYCERIN 0.4 MG SL tablet Commonly known as: NITROSTAT Place 1 tablet (0.4 mg total) under the tongue every 5 (five) minutes as needed for chest pain (up to 3 doses only.).   pravastatin 40 MG tablet Commonly known as: PRAVACHOL Take 1 tablet (40 mg total) by mouth daily.   telmisartan 20 MG tablet Commonly known as: MICARDIS Take 1 tablet (20 mg total) by mouth daily.          Objective:   Physical Exam BP (!) 151/73 (BP  Location: Left Arm, Patient Position: Sitting, Cuff Size: Small)   Pulse 71   Temp 98.1 F (36.7 C) (Oral)   Resp 18   Ht 5\' 9"  (1.753 m)   Wt 171 lb 6 oz (77.7 kg)   SpO2 94%   BMI 25.31 kg/m  General:   Well developed, NAD, BMI noted.  HEENT:  Normocephalic . Face symmetric, atraumatic Lungs:  Slightly increased expiratory time. Dry crackles at bases?  Very subtle. No rhonchi. Heart: RRR,  no murmur.  Abdomen:  Not distended, soft, non-tender. Skin: At the base of the right great toe, palmar aspect, has a 1 x 1 cm very shallow ulcer without redness, swelling or discharge. Lower extremities: no pretibial edema bilaterally  Neurologic:  alert & oriented X3.  Speech normal, gait appropriate for age and unassisted Psych--  Cognition and judgment appear intact.  Cooperative with normal attention span and concentration.  Behavior appropriate. No anxious or depressed appearing.     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 Ronnald Ramp Chronic hoarseness, status post ENT eval ~ 2012 Normal DEXA 2012 HOH H/o Shingles  PLAN: DM, neuropathy, foot ulcer: Continue Metformin.  Ulcer seems to be healing well, he sees the podiatrist regularly, no evidence of infection.  Check a A1c  HTN: Slightly elevated today, ambulatory BP consistently in the 120s, check a BMP, CBC, cont. Imdur, Norvasc, Micardis. Fatigue: Describe a lack of energy, no chest pain, DOE symptoms at baseline.  Suspect this will be multifactorial, requested labs, will check a TSH, B12 and vitamin D.  Encouraged to discuss issue with cardiology although he has no chest pain and does not seem to be volume overload. COPD: Last visit with pulmonology 04-2020, no changes  suggested. Preventive care: To get a Covid vaccination booster soon. RTC 4 months   This visit occurred during the SARS-CoV-2 public health emergency.  Safety protocols were in place, including screening questions prior to the visit, additional usage of staff PPE, and extensive cleaning of exam room while observing appropriate contact time as indicated for disinfecting solutions.

## 2020-06-20 LAB — CBC WITH DIFFERENTIAL/PLATELET
Absolute Monocytes: 1392 cells/uL — ABNORMAL HIGH (ref 200–950)
Basophils Absolute: 132 cells/uL (ref 0–200)
Basophils Relative: 1.1 %
Eosinophils Absolute: 432 cells/uL (ref 15–500)
Eosinophils Relative: 3.6 %
HCT: 43.8 % (ref 38.5–50.0)
Hemoglobin: 15 g/dL (ref 13.2–17.1)
Lymphs Abs: 1812 cells/uL (ref 850–3900)
MCH: 31.1 pg (ref 27.0–33.0)
MCHC: 34.2 g/dL (ref 32.0–36.0)
MCV: 90.7 fL (ref 80.0–100.0)
MPV: 10.1 fL (ref 7.5–12.5)
Monocytes Relative: 11.6 %
Neutro Abs: 8232 cells/uL — ABNORMAL HIGH (ref 1500–7800)
Neutrophils Relative %: 68.6 %
Platelets: 292 10*3/uL (ref 140–400)
RBC: 4.83 10*6/uL (ref 4.20–5.80)
RDW: 12 % (ref 11.0–15.0)
Total Lymphocyte: 15.1 %
WBC: 12 10*3/uL — ABNORMAL HIGH (ref 3.8–10.8)

## 2020-06-20 LAB — B12 AND FOLATE PANEL
Folate: 22.3 ng/mL
Vitamin B-12: 1155 pg/mL — ABNORMAL HIGH (ref 200–1100)

## 2020-06-20 LAB — BASIC METABOLIC PANEL
BUN: 14 mg/dL (ref 7–25)
CO2: 27 mmol/L (ref 20–32)
Calcium: 9.9 mg/dL (ref 8.6–10.3)
Chloride: 97 mmol/L — ABNORMAL LOW (ref 98–110)
Creat: 1.03 mg/dL (ref 0.70–1.11)
Glucose, Bld: 120 mg/dL — ABNORMAL HIGH (ref 65–99)
Potassium: 5.7 mmol/L — ABNORMAL HIGH (ref 3.5–5.3)
Sodium: 135 mmol/L (ref 135–146)

## 2020-06-20 LAB — TSH: TSH: 1.96 mIU/L (ref 0.40–4.50)

## 2020-06-20 LAB — HEMOGLOBIN A1C
Hgb A1c MFr Bld: 6.5 % of total Hgb — ABNORMAL HIGH (ref <5.7)
Mean Plasma Glucose: 140 (calc)
eAG (mmol/L): 7.7 (calc)

## 2020-06-20 NOTE — Assessment & Plan Note (Signed)
DM, neuropathy, foot ulcer: Continue Metformin.  Ulcer seems to be healing well, he sees the podiatrist regularly, no evidence of infection.  Check a A1c  HTN: Slightly elevated today, ambulatory BP consistently in the 120s, check a BMP, CBC, cont. Imdur, Norvasc, Micardis. Fatigue: Describe a lack of energy, no chest pain, DOE symptoms at baseline.  Suspect this will be multifactorial, requested labs, will check a TSH, B12 and vitamin D.  Encouraged to discuss issue with cardiology although he has no chest pain and does not seem to be volume overload. COPD: Last visit with pulmonology 04-2020, no changes suggested. Preventive care: To get a Covid vaccination booster soon. RTC 4 months

## 2020-06-24 ENCOUNTER — Ambulatory Visit: Payer: PPO | Attending: Internal Medicine

## 2020-06-24 ENCOUNTER — Other Ambulatory Visit (HOSPITAL_BASED_OUTPATIENT_CLINIC_OR_DEPARTMENT_OTHER): Payer: Self-pay | Admitting: Internal Medicine

## 2020-06-24 ENCOUNTER — Other Ambulatory Visit (INDEPENDENT_AMBULATORY_CARE_PROVIDER_SITE_OTHER): Payer: PPO

## 2020-06-24 ENCOUNTER — Other Ambulatory Visit: Payer: Self-pay

## 2020-06-24 DIAGNOSIS — Z23 Encounter for immunization: Secondary | ICD-10-CM

## 2020-06-24 DIAGNOSIS — E875 Hyperkalemia: Secondary | ICD-10-CM

## 2020-06-24 NOTE — Progress Notes (Signed)
° °  Covid-19 Vaccination Clinic  Name:  Phillip Jordan    MRN: [id number] DOB: 02-Nov-1929  06/24/2020  [name] was observed post Covid-19 immunization for 15 minutes without incident. He was provided with Vaccine Information Sheet and instruction to access the V-Safe system.   [name] was instructed to call 911 with any severe reactions post vaccine:  Difficulty breathing   Swelling of face and throat   A fast heartbeat   A bad rash all over body   Dizziness and weakness

## 2020-06-25 LAB — BASIC METABOLIC PANEL
BUN: 12 mg/dL (ref 7–25)
CO2: 28 mmol/L (ref 20–32)
Calcium: 10 mg/dL (ref 8.6–10.3)
Chloride: 97 mmol/L — ABNORMAL LOW (ref 98–110)
Creat: 1.09 mg/dL (ref 0.70–1.11)
Glucose, Bld: 209 mg/dL — ABNORMAL HIGH (ref 65–99)
Potassium: 5.3 mmol/L (ref 3.5–5.3)
Sodium: 135 mmol/L (ref 135–146)

## 2020-06-27 ENCOUNTER — Encounter: Payer: Self-pay | Admitting: Internal Medicine

## 2020-07-03 MED FILL — MODERNA COVID-19 VACCINE 10: 100 | 1 days supply | Qty: 0 | Fill #0

## 2020-07-05 ENCOUNTER — Telehealth: Payer: Self-pay | Admitting: Cardiovascular Disease

## 2020-07-05 NOTE — Telephone Encounter (Signed)
Called patient's wife (DPR) back about message. Patient complaining of SOB with activity and having low energy level for the past 3 months. Patient denies any chest pain or edema. Patient's most recent BP is 116/72 and HR 69. Encouraged patient's wife to contact patient's pulmonologist since patient has history of COPD and asthma. Patient's wife stated they have talked to Dr. [name] about patient's energy level. Dr. [name] had suggested discussing with cardiology even though he has no chest pain and does not seem to be volume overloaded. Will forward to Dr. [name] for advisement. Encouraged patient to keep his appointment in December with Dr. [name] and to call Dr. [name] about the DOE. Patient's wife verbalized understanding.

## 2020-07-05 NOTE — Telephone Encounter (Signed)
Pt c/o Shortness Of Breath: STAT if SOB developed within the last 24 hours or pt is noticeably SOB on the phone  1. Are you currently SOB (can you hear that pt is SOB on the phone)? Yes per wife.   2. How long have you been experiencing SOB? Pt has COPD and Asthma but states it has gotten worse over the last couple of months.   3. Are you SOB when sitting or when up moving around? Moving around. Ex.) showering, putting on socks and shoes, etc.   4. Are you currently experiencing any other symptoms? Energy level is very low.

## 2020-07-06 NOTE — Telephone Encounter (Signed)
Agree shiujd fu with primary and pulmonary and have Cxr for starters

## 2020-07-08 ENCOUNTER — Telehealth: Payer: Self-pay | Admitting: Internal Medicine

## 2020-07-08 ENCOUNTER — Other Ambulatory Visit: Payer: Self-pay

## 2020-07-08 ENCOUNTER — Ambulatory Visit: Payer: PPO | Admitting: Podiatry

## 2020-07-08 DIAGNOSIS — E119 Type 2 diabetes mellitus without complications: Secondary | ICD-10-CM

## 2020-07-08 DIAGNOSIS — E0843 Diabetes mellitus due to underlying condition with diabetic autonomic (poly)neuropathy: Secondary | ICD-10-CM

## 2020-07-08 DIAGNOSIS — L97512 Non-pressure chronic ulcer of other part of right foot with fat layer exposed: Secondary | ICD-10-CM | POA: Diagnosis not present

## 2020-07-08 NOTE — Progress Notes (Addendum)
   Subjective:  86 y.o. male  presenting today for follow-up evaluation of an ulcer to the plantar aspect of the right first MTPJ.  Patient has a very high arch foot that causes a lot of pressure to the area.  He normally comes in for routine foot care however his developed some bleeding and discoloration around the area.  He has been applying antibiotic cream and a Band-Aid to the area.  He is also been wearing his diabetic shoes and insoles with an offloading felt pad to the insole.  No new complaints at this time  Past Medical History:  Diagnosis Date  . Allergic rhinitis   . Aortic dissection ([place])    f/u by cards - AAA with distal dissection.  [name] CAD (coronary artery disease)    a. s/p MI 1992. b. Prior hx stenting to RCA/Cx. b. 05/2013: [place] s/p DES to distal RCA then staged DES to prox LAD.   [name] COPD with asthma ([place])   . Diabetes mellitus   . Hyperlipidemia   . Lichen planus    Right midline inferior chest, Dr. [name] Sensing  . SCC (squamous cell carcinoma)   . Shingles       Objective/Physical Exam General: The patient is alert and oriented x3 in no acute distress.  Dermatology:  Wound #1 noted to the 0.3 x 0.3 x 0.1 cm (LxWxD).  Slightly improved since last visit  To the noted ulceration(s), there is no eschar. There is a moderate amount of slough, fibrin, and necrotic tissue noted. Granulation tissue and wound base is red. There is a minimal amount of serosanguineous drainage noted. There is no exposed bone muscle-tendon ligament or joint. There is no malodor. Periwound integrity is intact. Skin is warm, dry and supple bilateral lower extremities.  Vascular: Palpable pedal pulses bilaterally. No edema or erythema noted. Capillary refill within normal limits.  Neurological: Epicritic and protective threshold diminished bilaterally.   Musculoskeletal Exam: Range of motion within normal limits to all pedal and ankle joints bilateral. Muscle strength 5/5 in all groups bilateral.  Plantarflexed first ray.   Assessment: 1.  Ulcer subfirst MTPJ right foot secondary to diabetes mellitus  Plan of Care:  1. Patient was evaluated.  Comprehensive diabetic foot exam performed today. 2. medically necessary excisional debridement including subcutaneous tissue was performed using a tissue nipper and a chisel blade. Excisional debridement of all the necrotic nonviable tissue down to healthy bleeding viable tissue was performed with post-debridement measurements same as pre-. 3. the wound was cleansed and dry sterile dressing applied. 4.  Continue gentamicin cream daily 5.  Continue offloading dancers pad to the insoles of the shoes 6.  Patient is to return to clinic in 3 weeks.   [name], DPM [facility]  Dr. [name], [address]                                        [place], [place] [address]                Office [phone]  Fax [phone]

## 2020-07-08 NOTE — Telephone Encounter (Signed)
Form completed, placed in PCP red folder to be signed.  

## 2020-07-08 NOTE — Addendum Note (Signed)
Addended by: [name] on: 07/08/2020 05:08 PM   Modules accepted: Level of Service

## 2020-07-08 NOTE — Telephone Encounter (Signed)
Document fax to office to be filled out by provider ([employer] - 5 pages Diabetes & Heart Care Plan) Document put at front office tray under providers name.

## 2020-07-09 NOTE — Telephone Encounter (Signed)
Form faxed back to [employer] at [phone]. Form sent for scanning.

## 2020-07-25 ENCOUNTER — Encounter: Payer: Self-pay | Admitting: Internal Medicine

## 2020-07-25 DIAGNOSIS — C4442 Squamous cell carcinoma of skin of scalp and neck: Secondary | ICD-10-CM | POA: Diagnosis not present

## 2020-07-25 DIAGNOSIS — L570 Actinic keratosis: Secondary | ICD-10-CM | POA: Diagnosis not present

## 2020-07-25 DIAGNOSIS — L820 Inflamed seborrheic keratosis: Secondary | ICD-10-CM | POA: Diagnosis not present

## 2020-07-25 DIAGNOSIS — D485 Neoplasm of uncertain behavior of skin: Secondary | ICD-10-CM | POA: Diagnosis not present

## 2020-07-25 DIAGNOSIS — Z85828 Personal history of other malignant neoplasm of skin: Secondary | ICD-10-CM | POA: Diagnosis not present

## 2020-07-25 NOTE — Progress Notes (Signed)
Date:  07/29/2020   ID:  Phillip Jordan, DOB [birth year], MRN [id number]  PCP:  Colon Branch, MD  Cardiologist:  [name] Electrophysiologist:  None   Evaluation Performed:  Follow-Up Visit  Chief Complaint:  CAD  History of Present Illness:    86 y.o. . with distant history of CAD with DES to proximal RCA and mid LAD in October 2014   Significant COPD Gold 3 followed by pulmonary Dr [name] Stopped smoking in 1994  Has had more fatigue and dyspnea last 3 months Renal function and Hct ok 06/19/20   More fatigue and exertional dyspnea New diagnosis of atrial flutter in clinic today confirmed by ECG Discussed diagnosis. Rate control, anticoagulation and conversion issues [place] VASC 4 Bruises Easy on ASA/Plavix will need to stop latter   He has had his vaccines Long discussion about need for [facility] after 3-4 weeks of anticoagulation  The patient does not have symptoms concerning for COVID-19 infection (fever, chills, cough, or new shortness of breath).    Past Medical History:  Diagnosis Date  . Allergic rhinitis   . Aortic dissection ([place])    f/u by cards - AAA with distal dissection.  [name] CAD (coronary artery disease)    a. s/p MI 1992. b. Prior hx stenting to RCA/Cx. b. 05/2013: [place] s/p DES to distal RCA then staged DES to prox LAD.   [name] COPD with asthma ([place])   . Diabetes mellitus   . Hyperlipidemia   . Lichen planus    Right midline inferior chest, Dr. [name] Sensing  . SCC (squamous cell carcinoma)   . Shingles    Past Surgical History:  Procedure Laterality Date  . BACK SURGERY  1993  . CARDIAC CATHETERIZATION     left, w/ coronary angiography and left ventriculograpy  . LEFT HEART CATHETERIZATION WITH CORONARY ANGIOGRAM N/A 06/09/2013   Procedure: LEFT HEART CATHETERIZATION WITH CORONARY ANGIOGRAM;  Surgeon: [name], MD;  Location: [facility] CATH LAB;  Service: Cardiovascular;  Laterality: N/A;  . PERCUTANEOUS CORONARY STENT INTERVENTION (PCI-S) N/A 06/12/2013    Procedure: PERCUTANEOUS CORONARY STENT INTERVENTION (PCI-S);  Surgeon: [name], MD;  Location: [facility] CATH LAB;  Service: Cardiovascular;  Laterality: N/A;  prox LAD  . PTCA     w/ placement of drugeluting stent in the distal right coronary artery, and distal Cardiologist: [name]     Current Meds  Medication Sig  . albuterol (VENTOLIN HFA) 108 (90 Base) MCG/ACT inhaler Inhale 2 puffs into the lungs every 4 (four) hours as needed for wheezing or shortness of breath.  [name] amLODipine (NORVASC) 2.5 MG tablet Take 2 tablets (5 mg total) by mouth daily.  [name] aspirin 81 MG tablet Take 1 tablet (81 mg total) by mouth daily.  [name] azelastine (ASTELIN) 0.1 % nasal spray Place 2 sprays into both nostrils at bedtime as needed for rhinitis. Use in each nostril as directed  . budesonide-formoterol (SYMBICORT) 160-4.5 MCG/ACT inhaler Take 2 puffs first thing in am and then another 2 puffs about 12 hours later.  . clopidogrel (PLAVIX) 75 MG tablet Take 1 tablet (75 mg total) by mouth daily.  . fluticasone (FLONASE) 50 MCG/ACT nasal spray Place 2 sprays into both nostrils daily as needed for allergies or rhinitis.  [name] glucose blood (FREESTYLE TEST STRIPS) test strip Check blood sugar no more than twice daily  . isosorbide mononitrate (IMDUR) 30 MG 24 hr tablet Take 1 tablet (30 mg total) by mouth 2 (two) times daily.  [name]  meclizine (ANTIVERT) 12.5 MG tablet Take 1 tablet (12.5 mg total) 3 (three) times daily as needed by mouth for dizziness.  . metFORMIN (GLUCOPHAGE) 500 MG tablet Take 1 tablet (500 mg total) by mouth daily with breakfast.  . Misc. Devices (ACAPELLA) MISC Use as directed  . Multiple Vitamin (MULTIVITAMIN WITH MINERALS) TABS tablet Take 1 tablet by mouth daily.  . nitroGLYCERIN (NITROSTAT) 0.4 MG SL tablet Place 1 tablet (0.4 mg total) under the tongue every 5 (five) minutes as needed for chest pain (up to 3 doses only.).  [name] pravastatin (PRAVACHOL) 40 MG tablet Take 1 tablet (40 mg  total) by mouth daily.     Allergies:   Patient has no known allergies.   Social History   Tobacco Use  . Smoking status: Former Smoker    Packs/day: 1.50    Years: 39.00    Pack years: 58.50    Types: Cigarettes    Quit date: 08/24/1992    Years since quitting: 27.9  . Smokeless tobacco: Never Used  Vaping Use  . Vaping Use: Never used  Substance Use Topics  . Alcohol use: No  . Drug use: No     Family Hx: The patient's family history includes Asthma in his mother; Cancer in his mother; Heart disease in his father and mother. There is no history of Prostate cancer or Colon cancer.  ROS:   Please see the history of present illness.     All other systems reviewed and are negative.   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 06/28/17 07/29/2020 Flutter rate 70 ICRBBB  Recent Labs: 12/13/2019: ALT 12 06/19/2020: Hemoglobin 15.0; Platelets 292; TSH 1.96 06/24/2020: BUN 12; Creat 1.09; Potassium 5.3; Sodium 135   Recent Lipid Panel Lab Results  Component Value Date/Time   CHOL 152 08/09/2019 09:30 AM   TRIG 117.0 08/09/2019 09:30 AM   TRIG 181 09/06/2009 04:02 PM   HDL 41.00 08/09/2019 09:30 AM   CHOLHDL 4 08/09/2019 09:30 AM   LDLCALC 87 08/09/2019 09:30 AM    Wt Readings from Last 3 Encounters:  07/29/20 76.8 kg  06/19/20 77.7 kg  05/06/20 76.2 kg     Objective:    Vital Signs:  BP (!) 144/74   Pulse 68   Ht 5\' 9"  (1.753 m)   Wt 76.8 kg   SpO2 93%   BMI 25.02 kg/m    Affect appropriate Healthy:  appears stated age 63: normal Neck supple with no adenopathy JVP normal no bruits no thyromegaly Lungs clear with no wheezing and good diaphragmatic motion Heart:  S1/S2 no murmur, no rub, gallop or click PMI normal Abdomen: benighn, BS positve, no tenderness, no AAA no bruit.  No HSM or HJR Distal pulses intact with no  bruits No edema Neuro non-focal Skin warm and dry No muscular weakness   ASSESSMENT & PLAN:    CAD  DES to RCA in 2014 Continue medical Rx New nitro called in no angina   HTN: Well controlled.  Continue current medications and low sodium Dash type diet.    AAA: upper normal 2.5 cm  No abdominal pain observe given age   DM:: Discussed low carb diet.  Target hemoglobin A1c is 6.5 or less.  Continue current medications.  Chol: on statin LDL at goal given age  COPD: no active  wheezing continue Dulera f/u pulmonary Dr [name]    PAF:  New diagnosis of flutter today D/c norvasc. Start lopressor 25 bid. D/C plavix continue baby aspirin for  CAD and start eliquis 5 bid CBC and lytes were ok on labs done 06/19/20 Return 3-4 weeks to arrange [facility] Update echo to assess atrial size and EF     COVID-19 Education: The signs and symptoms of COVID-19 were discussed with the patient and how to seek care for testing (follow up with PCP or arrange E-visit).  The importance of social distancing was discussed today.  Time:   Today, I have spent 30 minutes with the patient  .     Medication Adjustments/Labs and Tests Ordered: Current medicines are reviewed at length with the patient today.  Concerns regarding medicines are outlined above.   Tests Ordered:  Echo for new onset atrial flutter   Medication Changes:  D/c Norvasc and Plavix Start lopressor and eliquis  Disposition:  Follow up 3-4 weeks   Signed, Jenkins Rouge, MD  07/29/2020 10:26 AM    [place]

## 2020-07-29 ENCOUNTER — Telehealth: Payer: Self-pay | Admitting: Cardiovascular Disease

## 2020-07-29 ENCOUNTER — Ambulatory Visit: Payer: PPO | Admitting: Cardiovascular Disease

## 2020-07-29 ENCOUNTER — Encounter: Payer: Self-pay | Admitting: Cardiovascular Disease

## 2020-07-29 ENCOUNTER — Other Ambulatory Visit: Payer: Self-pay

## 2020-07-29 VITALS — BP 144/74 | HR 68 | Ht 69.0 in | Wt 169.4 lb

## 2020-07-29 DIAGNOSIS — I4892 Unspecified atrial flutter: Secondary | ICD-10-CM | POA: Diagnosis not present

## 2020-07-29 DIAGNOSIS — I2510 Atherosclerotic heart disease of native coronary artery without angina pectoris: Secondary | ICD-10-CM | POA: Diagnosis not present

## 2020-07-29 MED ORDER — APIXABAN 5 MG PO TABS: 5.0000 mg | ORAL_TABLET | Freq: Two times a day (BID) | ORAL | 11 refills | Status: AC

## 2020-07-29 MED ORDER — METOPROLOL TARTRATE 25 MG PO TABS: 25.0000 mg | ORAL_TABLET | Freq: Two times a day (BID) | ORAL | 3 refills | Status: DC

## 2020-07-29 NOTE — Telephone Encounter (Signed)
Informed patient that we sent a 90 day supply of metoprolol to patient's pharmacy. For the eliquis we sent in 30 day supply for patient to use his coupon.

## 2020-07-29 NOTE — Patient Instructions (Addendum)
Medication Instructions:  Your physician has recommended you make the following change in your medication:  1-STOP amlodipine (Norvasc) 2-STOP clopidogrel (Plavix) 3-START Eliquis 5 mg by mouth twice daily 4-START Metoprolol 25 mg by mouth twice daily  *If you need a refill on your cardiac medications before your next appointment, please call your pharmacy*  Lab Work: If you have labs (blood work) drawn today and your tests are completely normal, you will receive your results only by: [name] MyChart Message (if you have MyChart) OR . A paper copy in the mail If you have any lab test that is abnormal or we need to change your treatment, we will call you to review the results.  Testing/Procedures: Your physician has requested that you have an echocardiogram. Echocardiography is a painless test that uses sound waves to create images of your heart. It provides your doctor with information about the size and shape of your heart and how well your heart's chambers and valves are working. This procedure takes approximately one hour. There are no restrictions for this procedure.   Follow-Up: At [facility], you and your health needs are our priority.  As part of our continuing mission to provide you with exceptional heart care, we have created designated Provider Care Teams.  These Care Teams include your primary Cardiologist (physician) and Advanced Practice Providers (APPs -  Physician Assistants and Nurse Practitioners) who all work together to provide you with the care you need, when you need it.  We recommend signing up for the patient portal called "MyChart".  Sign up information is provided on this After Visit Summary.  MyChart is used to connect with patients for Virtual Visits (Telemedicine).  Patients are able to view lab/test results, encounter notes, upcoming appointments, etc.  Non-urgent messages can be sent to your provider as well.   To learn more about what you can do with MyChart, go to  [email or web address].    Your next appointment:   3 to 4 weeks  The format for your next appointment:   In Person  Provider:   You may see Dr. [name] or one of the following Advanced Practice Providers on your designated Care Team:    Truitt Merle, NP  Cecilie Kicks, NP  Kathyrn Drown, NP

## 2020-07-29 NOTE — Telephone Encounter (Signed)
*  STAT* If patient is at the pharmacy, call can be transferred to refill team.   1. Which medications need to be refilled? (please list name of each medication and dose if known) apixaban (ELIQUIS) 5 MG TABS tablet; metoprolol tartrate (LOPRESSOR) 25 MG tablet  2. Which pharmacy/location (including street and city if local pharmacy) is medication to be sent to? [place], [place]  3. Do they need a 30 day or 90 day supply? 90 for both

## 2020-08-05 ENCOUNTER — Other Ambulatory Visit: Payer: Self-pay

## 2020-08-05 ENCOUNTER — Ambulatory Visit: Payer: PPO | Admitting: Podiatry

## 2020-08-05 DIAGNOSIS — L97512 Non-pressure chronic ulcer of other part of right foot with fat layer exposed: Secondary | ICD-10-CM

## 2020-08-05 DIAGNOSIS — E0843 Diabetes mellitus due to underlying condition with diabetic autonomic (poly)neuropathy: Secondary | ICD-10-CM

## 2020-08-11 NOTE — Progress Notes (Signed)
   Subjective:  86 y.o. male  presenting today for follow-up evaluation of an ulcer to the plantar aspect of the right first MTPJ.  Patient has a very high arch foot that causes a lot of pressure to the area.  Patient states that he has no pain and no new concerns at this time.  He has been applying the antibiotic cream with some modest improvement.  He presents for further treatment and evaluation  Past Medical History:  Diagnosis Date  . Allergic rhinitis   . Aortic dissection ([place])    f/u by cards - AAA with distal dissection.  [name] CAD (coronary artery disease)    a. s/p MI 1992. b. Prior hx stenting to RCA/Cx. b. 05/2013: [place] s/p DES to distal RCA then staged DES to prox LAD.   [name] COPD with asthma ([place])   . Diabetes mellitus   . Hyperlipidemia   . Lichen planus    Right midline inferior chest, Dr. [name] Sensing  . SCC (squamous cell carcinoma)   . Shingles       Objective/Physical Exam General: The patient is alert and oriented x3 in no acute distress.  Dermatology:  Wound #1 noted to the 0.2x0.2 x 0.1 cm (LxWxD).  Slightly improved since last visit  To the noted ulceration(s), there is no eschar. There is a moderate amount of slough, fibrin, and necrotic tissue noted. Granulation tissue and wound base is red. There is a minimal amount of serosanguineous drainage noted. There is no exposed bone muscle-tendon ligament or joint. There is no malodor. Periwound integrity is intact. Skin is warm, dry and supple bilateral lower extremities.  Vascular: Palpable pedal pulses bilaterally. No edema or erythema noted. Capillary refill within normal limits.  Neurological: Epicritic and protective threshold diminished bilaterally.   Musculoskeletal Exam: Range of motion within normal limits to all pedal and ankle joints bilateral. Muscle strength 5/5 in all groups bilateral. Plantarflexed first ray.   Assessment: 1.  Ulcer subfirst MTPJ right foot secondary to diabetes mellitus  Plan of  Care:  1. Patient was evaluated.   2. medically necessary excisional debridement including subcutaneous tissue was performed using a tissue nipper and a chisel blade. Excisional debridement of all the necrotic nonviable tissue down to healthy bleeding viable tissue was performed with post-debridement measurements same as pre-. 3. the wound was cleansed and dry sterile dressing applied. 4.  Continue gentamicin cream daily 5.  Continue offloading dancers pad to the insoles of the shoes 6.  Patient is to return to clinic in 3 weeks.   [name], DPM [facility]  Dr. [name], [address]                                        [place], [place] [address]                Office [phone]  Fax [phone]

## 2020-08-21 ENCOUNTER — Ambulatory Visit (HOSPITAL_COMMUNITY): Payer: PPO | Attending: Internal Medicine

## 2020-08-21 ENCOUNTER — Other Ambulatory Visit: Payer: Self-pay

## 2020-08-21 DIAGNOSIS — I2510 Atherosclerotic heart disease of native coronary artery without angina pectoris: Secondary | ICD-10-CM | POA: Insufficient documentation

## 2020-08-21 DIAGNOSIS — I4892 Unspecified atrial flutter: Secondary | ICD-10-CM | POA: Diagnosis not present

## 2020-08-21 LAB — ECHOCARDIOGRAM COMPLETE
Area-P 1/2: 4.39 cm2
S' Lateral: 2.7 cm

## 2020-08-26 NOTE — Progress Notes (Signed)
Date:  08/29/2020   ID:  Phillip Jordan, DOB 1929/09/05, MRN [id number]  PCP:  Colon Branch, MD  Cardiologist:  [name] Electrophysiologist:  None   Evaluation Performed:  Follow-Up Visit  Chief Complaint:  CAD  History of Present Illness:    87 y.o. . with distant history of CAD with DES to proximal RCA and mid LAD in October 2014   Significant COPD Gold 3 followed by pulmonary Dr [name] Stopped smoking in 1994  Has had more fatigue and dyspnea last 3 months Renal function and Hct ok 06/19/20   New diagnosis of atrial flutter in clinic 07/29/20  confirmed by ECG CHADVASC 4  Plavix d/c started on eliquis   TTE reviewed from 08/21/20 EF 50-55% no significant valve disease LA moderately dilated 4.8 cm   Compliant with eliquis no bleeding issues. Wife [name] with him today Long discussion about Benefits of restoration NsR and DCC procedure including risks of stroke, PPM, ETT   ECG today shows continued course flutter Also discussed possible ablation if [facility] not Successful    The patient does not have symptoms concerning for COVID-19 infection (fever, chills, cough, or new shortness of breath).    Past Medical History:  Diagnosis Date  . Allergic rhinitis   . Aortic dissection ([place])    f/u by cards - AAA with distal dissection.  [name] CAD (coronary artery disease)    a. s/p MI 1992. b. Prior hx stenting to RCA/Cx. b. 05/2013: [place] s/p DES to distal RCA then staged DES to prox LAD.   [name] COPD with asthma ([place])   . Diabetes mellitus   . Hyperlipidemia   . Lichen planus    Right midline inferior chest, Dr. [name] Sensing  . SCC (squamous cell carcinoma)   . Shingles    Past Surgical History:  Procedure Laterality Date  . BACK SURGERY  1993  . CARDIAC CATHETERIZATION     left, w/ coronary angiography and left ventriculograpy  . LEFT HEART CATHETERIZATION WITH CORONARY ANGIOGRAM N/A 06/09/2013   Procedure: LEFT HEART CATHETERIZATION WITH CORONARY ANGIOGRAM;  Surgeon: [name], MD;  Location: [facility] C[facility] LAB;  Service: Cardiovascular;  Laterality: N/A;  . PERCUTANEOUS CORONARY STENT INTERVENTION (PCI-S) N/A 06/12/2013   Procedure: PERCUTANEOUS CORONARY STENT INTERVENTION (PCI-S);  Surgeon: [name], MD;  Location: [facility] CATH LAB;  Service: Cardiovascular;  Laterality: N/A;  prox LAD  . PTCA     w/ placement of drugeluting stent in the distal right coronary artery, and distal Cardiologist: [name]     Current Meds  Medication Sig  . albuterol (VENTOLIN HFA) 108 (90 Base) MCG/ACT inhaler Inhale 2 puffs into the lungs every 4 (four) hours as needed for wheezing or shortness of breath.  [name] apixaban (ELIQUIS) 5 MG TABS tablet Take 1 tablet (5 mg total) by mouth 2 (two) times daily.  [name] aspirin 81 MG tablet Take 1 tablet (81 mg total) by mouth daily.  [name] azelastine (ASTELIN) 0.1 % nasal spray Place 2 sprays into both nostrils at bedtime as needed for rhinitis. Use in each nostril as directed  . budesonide-formoterol (SYMBICORT) 160-4.5 MCG/ACT inhaler Take 2 puffs first thing in am and then another 2 puffs about 12 hours later.  . fluticasone (FLONASE) 50 MCG/ACT nasal spray Place 2 sprays into both nostrils daily as needed for allergies or rhinitis.  . Fluticasone-Salmeterol ([place]) Inhale into the lungs.  [name] glucose blood (FREESTYLE TEST STRIPS) test strip Check blood sugar  no more than twice daily  . isosorbide mononitrate (IMDUR) 30 MG 24 hr tablet Take 1 tablet (30 mg total) by mouth 2 (two) times daily.  . meclizine (ANTIVERT) 12.5 MG tablet Take 1 tablet (12.5 mg total) 3 (three) times daily as needed by mouth for dizziness.  . metFORMIN (GLUCOPHAGE) 500 MG tablet Take 1 tablet (500 mg total) by mouth daily with breakfast.  . metoprolol tartrate (LOPRESSOR) 25 MG tablet Take 1 tablet (25 mg total) by mouth 2 (two) times daily.  . Misc. Devices (ACAPELLA) MISC Use as directed  . Multiple Vitamin (MULTIVITAMIN WITH MINERALS) TABS tablet  Take 1 tablet by mouth daily.  . nitroGLYCERIN (NITROSTAT) 0.4 MG SL tablet Place 1 tablet (0.4 mg total) under the tongue every 5 (five) minutes as needed for chest pain (up to 3 doses only.).  [name] pravastatin (PRAVACHOL) 40 MG tablet Take 1 tablet (40 mg total) by mouth daily.     Allergies:   Patient has no known allergies.   Social History   Tobacco Use  . Smoking status: Former Smoker    Packs/day: 1.50    Years: 39.00    Pack years: 58.50    Types: Cigarettes    Quit date: 08/24/1992    Years since quitting: 28.0  . Smokeless tobacco: Never Used  Vaping Use  . Vaping Use: Never used  Substance Use Topics  . Alcohol use: No  . Drug use: No     Family Hx: The patient's family history includes Asthma in his mother; Cancer in his mother; Heart disease in his father and mother. There is no history of Prostate cancer or Colon cancer.  ROS:   Please see the history of present illness.     All other systems reviewed and are negative.   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 06/28/17 08/29/2020 Flutter rate 70 ICRBBB  Recent Labs: 12/13/2019: ALT 12 06/19/2020: Hemoglobin 15.0; Platelets 292; TSH 1.96 06/24/2020: BUN 12; Creat 1.09; Potassium 5.3; Sodium 135   Recent Lipid Panel Lab Results  Component Value Date/Time   CHOL 152 08/09/2019 09:30 AM   TRIG 117.0 08/09/2019 09:30 AM   TRIG 181 09/06/2009 04:02 PM   HDL 41.00 08/09/2019 09:30 AM   CHOLHDL 4 08/09/2019 09:30 AM   LDLCALC 87 08/09/2019 09:30 AM    Wt Readings from Last 3 Encounters:  08/29/20 76.2 kg  07/29/20 76.8 kg  06/19/20 77.7 kg     Objective:    Vital Signs:  BP (!) 150/80   Pulse 66   Ht 5\' 9"  (1.753 m)   Wt 76.2 kg   SpO2 98%   BMI 24.81 kg/m    Affect appropriate Healthy:  appears stated age HEENT: normal Neck supple with no adenopathy JVP normal no bruits  no thyromegaly Lungs clear with no wheezing and good diaphragmatic motion Heart:  S1/S2 no murmur, no rub, gallop or click PMI normal Abdomen: benighn, BS positve, no tenderness, no AAA no bruit.  No HSM or HJR Distal pulses intact with no bruits No edema Neuro non-focal Skin warm and dry No muscular weakness   ASSESSMENT & PLAN:    CAD  DES to RCA in 2014 Continue medical Rx New nitro called in no angina   HTN: Well controlled.  Continue current medications and low sodium Dash  type diet.    AAA: upper normal 2.5 cm  No abdominal pain observe given age   DM:: Discussed low carb diet.  Target hemoglobin A1c is 6.5 or less.  Continue current medications.  Chol: on statin LDL at goal given age  COPD: no active wheezing continue Dulera f/u pulmonary Dr [name]    PAF:  Diagnosed 07/29/20 Norvasc d/c started on lopressor and eliquis Plavix d/c TTE with no significant  Valve disease and EF 50-55% Clearly new symptoms of fatigue and dyspnea last 3 months Has been compliant With eliquis no missed doses [facility] arranged for next Thursday with me. Orders written lab called all questions  Answered     COVID-19 Education: The signs and symptoms of COVID-19 were discussed with the patient and how to seek care for testing (follow up with PCP or arrange E-visit).  The importance of social distancing was discussed today.  Time:   Today, I have spent 30 minutes with the patient  .     Medication Adjustments/Labs and Tests Ordered: Current medicines are reviewed at length with the patient today.  Concerns regarding medicines are outlined above.   Tests Ordered:  CBC/PLT BMET  Pre [facility]   Medication Changes:  None   Disposition:  Follow up post [facility]   Signed, Jenkins Rouge, MD  08/29/2020 10:56 AM    [place]

## 2020-08-26 NOTE — H&P (View-Only) (Signed)
Date:  08/29/2020   ID:  Phillip Jordan, DOB Sep 25, 1929, MRN [id number]  PCP:  Colon Branch, MD  Cardiologist:  [name] Electrophysiologist:  None   Evaluation Performed:  Follow-Up Visit  Chief Complaint:  CAD  History of Present Illness:    87 y.o. . with distant history of CAD with DES to proximal RCA and mid LAD in October 2014   Significant COPD Gold 3 followed by pulmonary Dr [name] Stopped smoking in 1994  Has had more fatigue and dyspnea last 3 months Renal function and Hct ok 06/19/20   New diagnosis of atrial flutter in clinic 07/29/20  confirmed by ECG CHADVASC 4  Plavix d/c started on eliquis   TTE reviewed from 08/21/20 EF 50-55% no significant valve disease LA moderately dilated 4.8 cm   Compliant with eliquis no bleeding issues. Wife [name] with him today Long discussion about Benefits of restoration NsR and DCC procedure including risks of stroke, PPM, ETT   ECG today shows continued course flutter Also discussed possible ablation if [facility] not Successful    The patient does not have symptoms concerning for COVID-19 infection (fever, chills, cough, or new shortness of breath).    Past Medical History:  Diagnosis Date  . Allergic rhinitis   . Aortic dissection ([place])    f/u by cards - AAA with distal dissection.  [name] CAD (coronary artery disease)    a. s/p MI 1992. b. Prior hx stenting to RCA/Cx. b. 05/2013: [place] s/p DES to distal RCA then staged DES to prox LAD.   [name] COPD with asthma ([place])   . Diabetes mellitus   . Hyperlipidemia   . Lichen planus    Right midline inferior chest, Dr. [name] Sensing  . SCC (squamous cell carcinoma)   . Shingles    Past Surgical History:  Procedure Laterality Date  . BACK SURGERY  1993  . CARDIAC CATHETERIZATION     left, w/ coronary angiography and left ventriculograpy  . LEFT HEART CATHETERIZATION WITH CORONARY ANGIOGRAM N/A 06/09/2013   Procedure: LEFT HEART CATHETERIZATION WITH CORONARY ANGIOGRAM;  Surgeon: [name], MD;  Location: [facility] CATH LAB;  Service: Cardiovascular;  Laterality: N/A;  . PERCUTANEOUS CORONARY STENT INTERVENTION (PCI-S) N/A 06/12/2013   Procedure: PERCUTANEOUS CORONARY STENT INTERVENTION (PCI-S);  Surgeon: [name], MD;  Location: [facility] CATH LAB;  Service: Cardiovascular;  Laterality: N/A;  prox LAD  . PTCA     w/ placement of drugeluting stent in the distal right coronary artery, and distal Cardiologist: [name]     Current Meds  Medication Sig  . albuterol (VENTOLIN HFA) 108 (90 Base) MCG/ACT inhaler Inhale 2 puffs into the lungs every 4 (four) hours as needed for wheezing or shortness of breath.  [name] apixaban (ELIQUIS) 5 MG TABS tablet Take 1 tablet (5 mg total) by mouth 2 (two) times daily.  [name] aspirin 81 MG tablet Take 1 tablet (81 mg total) by mouth daily.  [name] azelastine (ASTELIN) 0.1 % nasal spray Place 2 sprays into both nostrils at bedtime as needed for rhinitis. Use in each nostril as directed  . budesonide-formoterol (SYMBICORT) 160-4.5 MCG/ACT inhaler Take 2 puffs first thing in am and then another 2 puffs about 12 hours later.  . fluticasone (FLONASE) 50 MCG/ACT nasal spray Place 2 sprays into both nostrils daily as needed for allergies or rhinitis.  . Fluticasone-Salmeterol ([place]) Inhale into the lungs.  [name] glucose blood (FREESTYLE TEST STRIPS) test strip Check blood sugar  no more than twice daily  . isosorbide mononitrate (IMDUR) 30 MG 24 hr tablet Take 1 tablet (30 mg total) by mouth 2 (two) times daily.  . meclizine (ANTIVERT) 12.5 MG tablet Take 1 tablet (12.5 mg total) 3 (three) times daily as needed by mouth for dizziness.  . metFORMIN (GLUCOPHAGE) 500 MG tablet Take 1 tablet (500 mg total) by mouth daily with breakfast.  . metoprolol tartrate (LOPRESSOR) 25 MG tablet Take 1 tablet (25 mg total) by mouth 2 (two) times daily.  . Misc. Devices (ACAPELLA) MISC Use as directed  . Multiple Vitamin (MULTIVITAMIN WITH MINERALS) TABS tablet  Take 1 tablet by mouth daily.  . nitroGLYCERIN (NITROSTAT) 0.4 MG SL tablet Place 1 tablet (0.4 mg total) under the tongue every 5 (five) minutes as needed for chest pain (up to 3 doses only.).  [name] pravastatin (PRAVACHOL) 40 MG tablet Take 1 tablet (40 mg total) by mouth daily.     Allergies:   Patient has no known allergies.   Social History   Tobacco Use  . Smoking status: Former Smoker    Packs/day: 1.50    Years: 39.00    Pack years: 58.50    Types: Cigarettes    Quit date: 08/24/1992    Years since quitting: 28.0  . Smokeless tobacco: Never Used  Vaping Use  . Vaping Use: Never used  Substance Use Topics  . Alcohol use: No  . Drug use: No     Family Hx: The patient's family history includes Asthma in his mother; Cancer in his mother; Heart disease in his father and mother. There is no history of Prostate cancer or Colon cancer.  ROS:   Please see the history of present illness.     All other systems reviewed and are negative.   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 06/28/17 08/29/2020 Flutter rate 70 ICRBBB  Recent Labs: 12/13/2019: ALT 12 06/19/2020: Hemoglobin 15.0; Platelets 292; TSH 1.96 06/24/2020: BUN 12; Creat 1.09; Potassium 5.3; Sodium 135   Recent Lipid Panel Lab Results  Component Value Date/Time   CHOL 152 08/09/2019 09:30 AM   TRIG 117.0 08/09/2019 09:30 AM   TRIG 181 09/06/2009 04:02 PM   HDL 41.00 08/09/2019 09:30 AM   CHOLHDL 4 08/09/2019 09:30 AM   LDLCALC 87 08/09/2019 09:30 AM    Wt Readings from Last 3 Encounters:  08/29/20 76.2 kg  07/29/20 76.8 kg  06/19/20 77.7 kg     Objective:    Vital Signs:  BP (!) 150/80   Pulse 66   Ht 5\' 9"  (1.753 m)   Wt 76.2 kg   SpO2 98%   BMI 24.81 kg/m    Affect appropriate Healthy:  appears stated age HEENT: normal Neck supple with no adenopathy JVP normal no bruits  no thyromegaly Lungs clear with no wheezing and good diaphragmatic motion Heart:  S1/S2 no murmur, no rub, gallop or click PMI normal Abdomen: benighn, BS positve, no tenderness, no AAA no bruit.  No HSM or HJR Distal pulses intact with no bruits No edema Neuro non-focal Skin warm and dry No muscular weakness   ASSESSMENT & PLAN:    CAD  DES to RCA in 2014 Continue medical Rx New nitro called in no angina   HTN: Well controlled.  Continue current medications and low sodium Dash  type diet.    AAA: upper normal 2.5 cm  No abdominal pain observe given age   DM:: Discussed low carb diet.  Target hemoglobin A1c is 6.5 or less.  Continue current medications.  Chol: on statin LDL at goal given age  COPD: no active wheezing continue Dulera f/u pulmonary Dr [name]    PAF:  Diagnosed 07/29/20 Norvasc d/c started on lopressor and eliquis Plavix d/c TTE with no significant  Valve disease and EF 50-55% Clearly new symptoms of fatigue and dyspnea last 3 months Has been compliant With eliquis no missed doses [facility] arranged for next Thursday with me. Orders written lab called all questions  Answered     COVID-19 Education: The signs and symptoms of COVID-19 were discussed with the patient and how to seek care for testing (follow up with PCP or arrange E-visit).  The importance of social distancing was discussed today.  Time:   Today, I have spent 30 minutes with the patient  .     Medication Adjustments/Labs and Tests Ordered: Current medicines are reviewed at length with the patient today.  Concerns regarding medicines are outlined above.   Tests Ordered:  CBC/PLT BMET  Pre [facility]   Medication Changes:  None   Disposition:  Follow up post [facility]   Signed, Jenkins Rouge, MD  08/29/2020 10:56 AM    [place]

## 2020-08-29 ENCOUNTER — Other Ambulatory Visit: Payer: Self-pay

## 2020-08-29 ENCOUNTER — Encounter: Payer: Self-pay | Admitting: Cardiovascular Disease

## 2020-08-29 ENCOUNTER — Ambulatory Visit (INDEPENDENT_AMBULATORY_CARE_PROVIDER_SITE_OTHER): Payer: HMO | Admitting: Cardiovascular Disease

## 2020-08-29 VITALS — BP 150/80 | HR 66 | Ht 69.0 in | Wt 168.0 lb

## 2020-08-29 DIAGNOSIS — I4892 Unspecified atrial flutter: Secondary | ICD-10-CM

## 2020-08-29 DIAGNOSIS — I2510 Atherosclerotic heart disease of native coronary artery without angina pectoris: Secondary | ICD-10-CM

## 2020-08-29 LAB — CBC WITH DIFFERENTIAL/PLATELET
Basophils Absolute: 0.1 10*3/uL (ref 0.0–0.2)
Basos: 1 %
EOS (ABSOLUTE): 0.3 10*3/uL (ref 0.0–0.4)
Eos: 3 %
Hematocrit: 46.5 % (ref 37.5–51.0)
Hemoglobin: 15.9 g/dL (ref 13.0–17.7)
Immature Grans (Abs): 0 10*3/uL (ref 0.0–0.1)
Immature Granulocytes: 0 %
Lymphocytes Absolute: 1.8 10*3/uL (ref 0.7–3.1)
Lymphs: 16 %
MCH: 31.3 pg (ref 26.6–33.0)
MCHC: 34.2 g/dL (ref 31.5–35.7)
MCV: 92 fL (ref 79–97)
Monocytes Absolute: 1.5 10*3/uL — ABNORMAL HIGH (ref 0.1–0.9)
Monocytes: 13 %
Neutrophils Absolute: 7.8 10*3/uL — ABNORMAL HIGH (ref 1.4–7.0)
Neutrophils: 67 %
Platelets: 278 10*3/uL (ref 150–450)
RBC: 5.08 x10E6/uL (ref 4.14–5.80)
RDW: 11.9 % (ref 11.6–15.4)
WBC: 11.6 10*3/uL — ABNORMAL HIGH (ref 3.4–10.8)

## 2020-08-29 LAB — BASIC METABOLIC PANEL
BUN/Creatinine Ratio: 14 (ref 10–24)
BUN: 14 mg/dL (ref 8–27)
CO2: 25 mmol/L (ref 20–29)
Calcium: 10.1 mg/dL (ref 8.6–10.2)
Chloride: 96 mmol/L (ref 96–106)
Creatinine, Ser: 0.97 mg/dL (ref 0.76–1.27)
GFR calc Af Amer: 80 mL/min/{1.73_m2} (ref >59)
GFR calc non Af Amer: 69 mL/min/{1.73_m2} (ref >59)
Glucose: 87 mg/dL (ref 65–99)
Potassium: 5.4 mmol/L — ABNORMAL HIGH (ref 3.5–5.2)
Sodium: 136 mmol/L (ref 134–144)

## 2020-08-29 NOTE — Patient Instructions (Signed)
Medication Instructions:  *If you need a refill on your cardiac medications before your next appointment, please call your pharmacy*  Lab Work: Your physician recommends that you have lab work today- CBC and BMET  If you have labs (blood work) drawn today and your tests are completely normal, you will receive your results only by: [name] MyChart Message (if you have MyChart) OR . A paper copy in the mail If you have any lab test that is abnormal or we need to change your treatment, we will call you to review the results.   Testing/Procedures: Your physician has recommended that you have a Cardioversion (DCCV). Electrical Cardioversion uses a jolt of electricity to your heart either through paddles or wired patches attached to your chest. This is a controlled, usually prescheduled, procedure. Defibrillation is done under light anesthesia in the hospital, and you usually go home the day of the procedure. This is done to get your heart back into a normal rhythm. You are not awake for the procedure. Please see the instruction sheet given to you today.   Follow-Up: At [facility], you and your health needs are our priority.  As part of our continuing mission to provide you with exceptional heart care, we have created designated Provider Care Teams.  These Care Teams include your primary Cardiologist (physician) and Advanced Practice Providers (APPs -  Physician Assistants and Nurse Practitioners) who all work together to provide you with the care you need, when you need it.  We recommend signing up for the patient portal called "MyChart".  Sign up information is provided on this After Visit Summary.  MyChart is used to connect with patients for Virtual Visits (Telemedicine).  Patients are able to view lab/test results, encounter notes, upcoming appointments, etc.  Non-urgent messages can be sent to your provider as well.   To learn more about what you can do with MyChart, go to [email or web address].     Your next appointment:   2 week(s)  The format for your next appointment:   In Person  Provider:    You may see Dr. [name] or one of his NP or PA's    Other Instructions  Dear Phillip Jordan  You are scheduled for a Cardioversion on 09/05/20 with Dr. [name].  Please arrive at the [facility] (Main Entrance A) at [facility]: [address] [place], [place] [address] at 7:00 am.   DIET: Nothing to eat or drink after midnight except a sip of water with medications (see medication instructions below)  Medication Instructions: Hold Metoprolol the night before and the morning of your procedure.  Continue your anticoagulant: Eliquis You will need to continue your anticoagulant after your procedure until you  are told by your  Provider that it is safe to stop   Labs: Today  Due to recent COVID-19 restrictions implemented by our local and state authorities and in an effort to keep both patients and staff as safe as possible, our hospital system requires COVID-19 testing prior to certain scheduled hospital procedures.  Please go to [address] [place], [place] [address] on 09/03/20 at 12:20 pm  .  This is a drive up testing site.  You will not need to exit your vehicle.  You will not be billed at the time of testing but may receive a bill later depending on your insurance. You must agree to self-quarantine from the time of your testing until the procedure date on 1/13/222.  This should included staying home with  ONLY the people you live with.  Avoid take-out, grocery store shopping or leaving the house for any non-emergent reason.  Failure to have your COVID-19 test done on the date and time you have been scheduled will result in cancellation of your procedure.  Please call our office at [phone] if you have any questions.   You must have a responsible person to drive you home and stay in the waiting area during your procedure. Failure to do so could result in  cancellation.  Bring your insurance cards.  *Special Note: Every effort is made to have your procedure done on time. Occasionally there are emergencies that occur at the hospital that may cause delays. Please be patient if a delay does occur.

## 2020-08-30 ENCOUNTER — Other Ambulatory Visit: Payer: Self-pay | Admitting: Cardiovascular Disease

## 2020-09-03 ENCOUNTER — Other Ambulatory Visit (HOSPITAL_COMMUNITY)
Admission: RE | Admit: 2020-09-03 | Discharge: 2020-09-03 | Disposition: A | Discharge status: Home / Self Care | Payer: HMO | Source: Ambulatory Visit | Attending: Cardiovascular Disease | Admitting: Cardiovascular Disease

## 2020-09-03 DIAGNOSIS — Z01812 Encounter for preprocedural laboratory examination: Secondary | ICD-10-CM | POA: Diagnosis not present

## 2020-09-03 DIAGNOSIS — Z20822 Contact with and (suspected) exposure to covid-19: Secondary | ICD-10-CM | POA: Insufficient documentation

## 2020-09-03 LAB — SARS CORONAVIRUS 2 (TAT 6-24 HRS): SARS Coronavirus 2: NEGATIVE

## 2020-09-04 ENCOUNTER — Encounter (HOSPITAL_COMMUNITY): Payer: Self-pay | Admitting: Cardiovascular Disease

## 2020-09-04 NOTE — Anesthesia Preprocedure Evaluation (Addendum)
Anesthesia Evaluation  Patient identified by MRN, date of birth, ID band Patient awake    Reviewed: Allergy & Precautions, NPO status , Patient's Chart, lab work & pertinent test results, reviewed documented beta blocker date and time   Airway Mallampati: I  TM Distance: >3 FB Neck ROM: Full    Dental  (+) Partial Upper, Partial Lower   Pulmonary asthma , COPD,  COPD inhaler, former smoker,    Pulmonary exam normal breath sounds clear to auscultation       Cardiovascular hypertension, Pt. on medications and Pt. on home beta blockers + CAD, + Past MI and + Cardiac Stents  + dysrhythmias Atrial Fibrillation  Rhythm:Irregular Rate:Normal  Hx/o lower abdominal aortic dissection MI 1992 Stents x3 prox RCA, distal RCA, proximal LAD most recently 05/2013  Echo 08/21/20 1. Left ventricular ejection fraction, by estimation, is 50 to 55%. The left ventricle has low normal function. The leftventricle has no regional wall motion abnormalities. Left ventricular diastolic parameters are indeterminate.  2. Right ventricular systolic function is low normal. The right ventricular size is normal.  3. The mitral valve is normal in structure. Trivial mitral valve regurgitation.  4. The aortic valve is tricuspid. There is mild thickening of the aortic valve. Aortic valve regurgitation is not visualized. No aortic stenosis is present.  5. The inferior vena cava is normal in size with <50% respiratory variability, suggesting right atrial pressure of 8 mmHg.   EKG 08/29/20 Atrial flutter with variable block   Neuro/Psych negative neurological ROS  negative psych ROS   GI/Hepatic negative GI ROS, Neg liver ROS,   Endo/Other  diabetes, Well Controlled, Type 2, Oral Hypoglycemic AgentsHyperlipidema   Renal/GU negative Renal ROS  negative genitourinary   Musculoskeletal negative musculoskeletal ROS (+)   Abdominal   Peds  Hematology Eliquis  therapy- last dose this am   Anesthesia Other Findings   Reproductive/Obstetrics                           Anesthesia Physical Anesthesia Plan  ASA: III  Anesthesia Plan: General   Post-op Pain Management:    Induction:   PONV Risk Score and Plan: Treatment may vary due to age or medical condition  Airway Management Planned: Natural Airway and Mask  Additional Equipment:   Intra-op Plan:   Post-operative Plan:   Informed Consent: I have reviewed the patients History and Physical, chart, labs and discussed the procedure including the risks, benefits and alternatives for the proposed anesthesia with the patient or authorized representative who has indicated his/her understanding and acceptance.     Dental advisory given  Plan Discussed with: Anesthesiologist  Anesthesia Plan Comments:         Anesthesia Quick Evaluation

## 2020-09-05 ENCOUNTER — Ambulatory Visit (INDEPENDENT_AMBULATORY_CARE_PROVIDER_SITE_OTHER): Payer: HMO

## 2020-09-05 ENCOUNTER — Ambulatory Visit (HOSPITAL_COMMUNITY): Payer: HMO | Admitting: Anesthesiology

## 2020-09-05 ENCOUNTER — Telehealth: Payer: Self-pay

## 2020-09-05 ENCOUNTER — Other Ambulatory Visit: Payer: Self-pay

## 2020-09-05 ENCOUNTER — Ambulatory Visit (HOSPITAL_COMMUNITY)
Admission: RE | Admit: 2020-09-05 | Discharge: 2020-09-05 | Disposition: A | Discharge status: Home / Self Care | Payer: HMO | Source: Ambulatory Visit | Attending: Cardiovascular Disease | Admitting: Cardiovascular Disease

## 2020-09-05 ENCOUNTER — Encounter (HOSPITAL_COMMUNITY)
Admission: RE | Disposition: A | Discharge status: Home / Self Care | Payer: Self-pay | Source: Ambulatory Visit | Attending: Cardiovascular Disease

## 2020-09-05 DIAGNOSIS — I10 Essential (primary) hypertension: Secondary | ICD-10-CM | POA: Diagnosis not present

## 2020-09-05 DIAGNOSIS — I4892 Unspecified atrial flutter: Secondary | ICD-10-CM

## 2020-09-05 DIAGNOSIS — E119 Type 2 diabetes mellitus without complications: Secondary | ICD-10-CM | POA: Diagnosis not present

## 2020-09-05 DIAGNOSIS — I480 Paroxysmal atrial fibrillation: Secondary | ICD-10-CM | POA: Diagnosis not present

## 2020-09-05 DIAGNOSIS — Z7902 Long term (current) use of antithrombotics/antiplatelets: Secondary | ICD-10-CM | POA: Diagnosis not present

## 2020-09-05 DIAGNOSIS — Z7984 Long term (current) use of oral hypoglycemic drugs: Secondary | ICD-10-CM | POA: Insufficient documentation

## 2020-09-05 DIAGNOSIS — Z955 Presence of coronary angioplasty implant and graft: Secondary | ICD-10-CM | POA: Insufficient documentation

## 2020-09-05 DIAGNOSIS — I4891 Unspecified atrial fibrillation: Secondary | ICD-10-CM | POA: Diagnosis not present

## 2020-09-05 DIAGNOSIS — I714 Abdominal aortic aneurysm, without rupture: Secondary | ICD-10-CM | POA: Insufficient documentation

## 2020-09-05 DIAGNOSIS — Z87891 Personal history of nicotine dependence: Secondary | ICD-10-CM | POA: Insufficient documentation

## 2020-09-05 DIAGNOSIS — I2510 Atherosclerotic heart disease of native coronary artery without angina pectoris: Secondary | ICD-10-CM | POA: Insufficient documentation

## 2020-09-05 DIAGNOSIS — Z7982 Long term (current) use of aspirin: Secondary | ICD-10-CM | POA: Diagnosis not present

## 2020-09-05 DIAGNOSIS — E785 Hyperlipidemia, unspecified: Secondary | ICD-10-CM | POA: Diagnosis not present

## 2020-09-05 DIAGNOSIS — J449 Chronic obstructive pulmonary disease, unspecified: Secondary | ICD-10-CM | POA: Insufficient documentation

## 2020-09-05 DIAGNOSIS — Z7951 Long term (current) use of inhaled steroids: Secondary | ICD-10-CM | POA: Insufficient documentation

## 2020-09-05 DIAGNOSIS — Z79899 Other long term (current) drug therapy: Secondary | ICD-10-CM | POA: Insufficient documentation

## 2020-09-05 DIAGNOSIS — Z7901 Long term (current) use of anticoagulants: Secondary | ICD-10-CM | POA: Diagnosis not present

## 2020-09-05 HISTORY — PX: CARDIOVERSION: SHX1299

## 2020-09-05 SURGERY — CARDIOVERSION
Anesthesia: General

## 2020-09-05 MED ORDER — LIDOCAINE 2% (20 MG/ML) 5 ML SYRINGE
INTRAMUSCULAR | Status: DC | PRN
  Administered 2020-09-05: 40 mg via INTRAVENOUS

## 2020-09-05 MED ORDER — SODIUM CHLORIDE 0.9 % IV SOLN
INTRAVENOUS | Status: AC | PRN
  Administered 2020-09-05: 500 mL via INTRAVENOUS

## 2020-09-05 MED ORDER — PROPOFOL 10 MG/ML IV BOLUS
INTRAVENOUS | Status: DC | PRN
  Administered 2020-09-05: 60 mg via INTRAVENOUS

## 2020-09-05 NOTE — Anesthesia Postprocedure Evaluation (Signed)
Anesthesia Post Note  Patient: Phillip Jordan  Procedure(s) Performed: CARDIOVERSION (N/A )     Patient location during evaluation: PACU Anesthesia Type: General Level of consciousness: awake and alert Pain management: pain level controlled Vital Signs Assessment: post-procedure vital signs reviewed and stable Respiratory status: spontaneous breathing, nonlabored ventilation and respiratory function stable Cardiovascular status: blood pressure returned to baseline, stable and bradycardic Postop Assessment: no apparent nausea or vomiting Anesthetic complications: no   No complications documented.  Last Vitals:  Vitals:   09/05/20 0832 09/05/20 0843  BP: 123/80 (!) 117/49  Pulse: 60 (!) 42  Resp: 14 14  Temp:    SpO2: 99% 98%    Last Pain:  Vitals:   09/05/20 0843  TempSrc:   PainSc: 0-No pain                 Dontai Pember A.

## 2020-09-05 NOTE — CV Procedure (Signed)
DCC: Anesthesia: Dr Edwinna Areola Propofol  Taylor x 1 75J biphasic Converted from flutter to NSR rate 58 bpm On Rx eliquis with no missed doses  No immediate neurologic sequelae  Jenkins Rouge MD [facility]

## 2020-09-05 NOTE — Telephone Encounter (Signed)
-----   Message from [name], MD sent at 09/05/2020  8:24 AM EST ----- Needs 14 day monitor post [facility] for flutter

## 2020-09-05 NOTE — Discharge Instructions (Signed)
Electrical Cardioversion Electrical cardioversion is the delivery of a jolt of electricity to restore a normal rhythm to the heart. A rhythm that is too fast or is not regular keeps the heart from pumping well. In this procedure, sticky patches or metal paddles are placed on the chest to deliver electricity to the heart from a device. This procedure may be done in an emergency if:  There is low or no blood pressure as a result of the heart rhythm.  Normal rhythm must be restored as fast as possible to protect the brain and heart from further damage.  It may save a life. This may also be a scheduled procedure for irregular or fast heart rhythms that are not immediately life-threatening. Tell a health care provider about:  Any allergies you have.  All medicines you are taking, including vitamins, herbs, eye drops, creams, and over-the-counter medicines.  Any problems you or family members have had with anesthetic medicines.  Any blood disorders you have.  Any surgeries you have had.  Any medical conditions you have.  Whether you are pregnant or may be pregnant. What are the risks? Generally, this is a safe procedure. However, problems may occur, including:  Allergic reactions to medicines.  A blood clot that breaks free and travels to other parts of your body.  The possible return of an abnormal heart rhythm within hours or days after the procedure.  Your heart stopping (cardiac arrest). This is rare. What happens before the procedure? Medicines  Your health care provider may have you start taking: ? Blood-thinning medicines (anticoagulants) so your blood does not clot as easily. ? Medicines to help stabilize your heart rate and rhythm.  Ask your health care provider about: ? Changing or stopping your regular medicines. This is especially important if you are taking diabetes medicines or blood thinners. ? Taking medicines such as aspirin and ibuprofen. These medicines can  thin your blood. Do not take these medicines unless your health care provider tells you to take them. ? Taking over-the-counter medicines, vitamins, herbs, and supplements. General instructions  Follow instructions from your health care provider about eating or drinking restrictions.  Plan to have someone take you home from the hospital or clinic.  If you will be going home right after the procedure, plan to have someone with you for 24 hours.  Ask your health care provider what steps will be taken to help prevent infection. These may include washing your skin with a germ-killing soap. What happens during the procedure?  An IV will be inserted into one of your veins.  Sticky patches (electrodes) or metal paddles may be placed on your chest.  You will be given a medicine to help you relax (sedative).  An electrical shock will be delivered. The procedure may vary among health care providers and hospitals.   What can I expect after the procedure?  Your blood pressure, heart rate, breathing rate, and blood oxygen level will be monitored until you leave the hospital or clinic.  Your heart rhythm will be watched to make sure it does not change.  You may have some redness on the skin where the shocks were given. Follow these instructions at home:  Do not drive for 24 hours if you were given a sedative during your procedure.  Take over-the-counter and prescription medicines only as told by your health care provider.  Ask your health care provider how to check your pulse. Check it often.  Rest for 48 hours after the procedure   or as told by your health care provider.  Avoid or limit your caffeine use as told by your health care provider.  Keep all follow-up visits as told by your health care provider. This is important. Contact a health care provider if:  You feel like your heart is beating too quickly or your pulse is not regular.  You have a serious muscle cramp that does not go  away. Get help right away if:  You have discomfort in your chest.  You are dizzy or you feel faint.  You have trouble breathing or you are short of breath.  Your speech is slurred.  You have trouble moving an arm or leg on one side of your body.  Your fingers or toes turn cold or blue. Summary  Electrical cardioversion is the delivery of a jolt of electricity to restore a normal rhythm to the heart.  This procedure may be done right away in an emergency or may be a scheduled procedure if the condition is not an emergency.  Generally, this is a safe procedure.  After the procedure, check your pulse often as told by your health care provider. This information is not intended to replace advice given to you by your health care provider. Make sure you discuss any questions you have with your health care provider. Document Revised: 03/13/2019 Document Reviewed: 03/13/2019 Elsevier Patient Education  2021 Elsevier Inc.  

## 2020-09-05 NOTE — Transfer of Care (Signed)
Immediate Anesthesia Transfer of Care Note  Patient: Phillip Jordan  Procedure(s) Performed: CARDIOVERSION (N/A )  Patient Location: Endoscopy Unit  Anesthesia Type:General  Level of Consciousness: awake, alert  and oriented  Airway & Oxygen Therapy: Patient Spontanous Breathing  Post-op Assessment: Report given to RN and Post -op Vital signs reviewed and stable  Post vital signs: Reviewed and stable  Last Vitals:  Vitals Value Taken Time  BP 106/39   Temp    Pulse 69   Resp 12   SpO2 100     Last Pain:  Vitals:   09/05/20 0655  TempSrc: Temporal  PainSc: 0-No pain         Complications: No complications documented.

## 2020-09-05 NOTE — Anesthesia Procedure Notes (Signed)
Procedure Name: MAC Date/Time: 09/05/2020 8:05 AM Performed by: Dorthea Cove, CRNA Pre-anesthesia Checklist: Patient identified, Emergency Drugs available, Suction available, Patient being monitored and Timeout performed Patient Re-evaluated:Patient Re-evaluated prior to induction Oxygen Delivery Method: Ambu bag Preoxygenation: Pre-oxygenation with 100% oxygen Induction Type: IV induction Ventilation: Mask ventilation without difficulty Placement Confirmation: positive ETCO2 and CO2 detector Dental Injury: Teeth and Oropharynx as per pre-operative assessment

## 2020-09-05 NOTE — Interval H&P Note (Signed)
History and Physical Interval Note:  09/05/2020 7:26 AM  Phillip Jordan  has presented today for surgery, with the diagnosis of AFIB.  The various methods of treatment have been discussed with the patient and family. After consideration of risks, benefits and other options for treatment, the patient has consented to  Procedure(s): CARDIOVERSION (N/A) as a surgical intervention.  The patient's history has been reviewed, patient examined, no change in status, stable for surgery.  I have reviewed the patient's chart and labs.  Questions were answered to the patient's satisfaction.     Jenkins Rouge

## 2020-09-05 NOTE — Telephone Encounter (Signed)
Placed order for monitor. 

## 2020-09-06 ENCOUNTER — Telehealth: Payer: Self-pay | Admitting: Cardiovascular Disease

## 2020-09-06 NOTE — Telephone Encounter (Signed)
Cut back to 12.5 bid

## 2020-09-06 NOTE — Telephone Encounter (Signed)
STAT if HR is under 50 or over 120 (normal HR is 60-100 beats per minute)  1) What is your heart rate? 127/66 HR 42  2) Do you have a log of your heart rate readings (document readings)? 42, 39, 40, 50, 43  3) Do you have any other symptoms? No

## 2020-09-06 NOTE — Telephone Encounter (Signed)
Pts wife called to report that the pts HR has been running low today.Phillip Jordan  He had cardioversion yesterday and his only symptoms is mild dizziness but otherwise he is feeling well... his BP is 127/66 but hr.. 40,08,67,61,95.   He took his Metoprolol 25mg  this morning and due to take it tonight.   I will forward to Dr. [name] for his review and recommendations.

## 2020-09-09 ENCOUNTER — Encounter (HOSPITAL_COMMUNITY): Payer: Self-pay | Admitting: Cardiovascular Disease

## 2020-09-09 MED ORDER — METOPROLOL TARTRATE 25 MG PO TABS: 12.5000 mg | ORAL_TABLET | Freq: Two times a day (BID) | ORAL | 3 refills | Status: DC

## 2020-09-09 NOTE — Telephone Encounter (Signed)
Left message for patient & wife to call back. 

## 2020-09-09 NOTE — Telephone Encounter (Signed)
Called patient's wife (DPR) back with Dr. Kyla Balzarine recommendations. Updated medication list. Patient will start taking metoprolol 12.5 mg BID, unless patient's HR continues to be low and they will call back for advisement.

## 2020-09-09 NOTE — Telephone Encounter (Signed)
Wife of patient returning [place] Call

## 2020-09-10 ENCOUNTER — Telehealth: Payer: Self-pay | Admitting: *Deleted

## 2020-09-10 NOTE — Telephone Encounter (Signed)
Patient is wanting to cancel appt.@2 :45 on tomorrow and reschedule. Please call.

## 2020-09-10 NOTE — Telephone Encounter (Signed)
done

## 2020-09-10 NOTE — Progress Notes (Signed)
Date:  09/19/2020   ID:  Phillip Jordan, DOB 10/24/1929, MRN [id number]  PCP:  Colon Branch, MD  Cardiologist:  [name] Electrophysiologist:  None   Evaluation Performed:  Follow-Up Visit  Chief Complaint:  CAD  History of Present Illness:    87 y.o. . with distant history of CAD with DES to proximal RCA and mid LAD in October 2014   Significant COPD Gold 3 followed by pulmonary Dr [name] Stopped smoking in 1994  Has had more fatigue and dyspnea last 3 months Renal function and Hct ok 06/19/20   New diagnosis of atrial flutter in clinic 07/29/20  confirmed by ECG CHADVASC 4  Plavix d/c started on eliquis   TTE reviewed from 08/21/20 EF 50-55% no significant valve disease LA moderately dilated 4.8 cm   Had successful [facility] by myself 09/05/20 Post conversion some bradycardia and lopressor dose decreased HR still a bit low 50-60 discussed lowering lopressor to 12.5 mg daily. He is still in NSR    The patient does not have symptoms concerning for COVID-19 infection (fever, chills, cough, or new shortness of breath).    Past Medical History:  Diagnosis Date  . Allergic rhinitis   . Aortic dissection ([place])    f/u by cards - AAA with distal dissection.  [name] CAD (coronary artery disease)    a. s/p MI 1992. b. Prior hx stenting to RCA/Cx. b. 05/2013: [place] s/p DES to distal RCA then staged DES to prox LAD.   [name] COPD with asthma ([place])   . Diabetes mellitus   . Hyperlipidemia   . Lichen planus    Right midline inferior chest, Dr. [name] Sensing  . SCC (squamous cell carcinoma)   . Shingles    Past Surgical History:  Procedure Laterality Date  . BACK SURGERY  1993  . CARDIAC CATHETERIZATION     left, w/ coronary angiography and left ventriculograpy  . CARDIOVERSION N/A 09/05/2020   Procedure: CARDIOVERSION;  Surgeon: [name], MD;  Location: [facility] ENDOSCOPY;  Service: Cardiovascular;  Laterality: N/A;  . LEFT HEART CATHETERIZATION WITH CORONARY ANGIOGRAM N/A 06/09/2013    Procedure: LEFT HEART CATHETERIZATION WITH CORONARY ANGIOGRAM;  Surgeon: [name], MD;  Location: [facility] CATH LAB;  Service: Cardiovascular;  Laterality: N/A;  . PERCUTANEOUS CORONARY STENT INTERVENTION (PCI-S) N/A 06/12/2013   Procedure: PERCUTANEOUS CORONARY STENT INTERVENTION (PCI-S);  Surgeon: [name], MD;  Location: [facility] CATH LAB;  Service: Cardiovascular;  Laterality: N/A;  prox LAD  . PTCA     w/ placement of drugeluting stent in the distal right coronary artery, and distal Cardiologist: [name]     Current Meds  Medication Sig  . albuterol (VENTOLIN HFA) 108 (90 Base) MCG/ACT inhaler Inhale 2 puffs into the lungs every 4 (four) hours as needed for wheezing or shortness of breath.  [name] apixaban (ELIQUIS) 5 MG TABS tablet Take 1 tablet (5 mg total) by mouth 2 (two) times daily.  [name] aspirin EC 81 MG tablet Take 81 mg by mouth daily. Swallow whole.  [name] azelastine (ASTELIN) 0.1 % nasal spray Place 2 sprays into both nostrils at bedtime as needed for rhinitis. Use in each nostril as directed  . budesonide-formoterol (SYMBICORT) 160-4.5 MCG/ACT inhaler Take 2 puffs first thing in am and then another 2 puffs about 12 hours later.  . fluticasone (FLONASE) 50 MCG/ACT nasal spray Place 2 sprays into both nostrils daily as needed for allergies or rhinitis.  . Fluticasone-Salmeterol (ADVAIR) 250-50 MCG/DOSE AEPB Inhale  1 puff into the lungs 2 (two) times daily.  [name] gentamicin ointment (GARAMYCIN) 0.1 % Apply 1 application topically daily. Applied to right foot  . glucose blood (FREESTYLE TEST STRIPS) test strip Check blood sugar no more than twice daily  . isosorbide mononitrate (IMDUR) 30 MG 24 hr tablet Take 1 tablet (30 mg total) by mouth 2 (two) times daily.  . meclizine (ANTIVERT) 12.5 MG tablet Take 1 tablet (12.5 mg total) 3 (three) times daily as needed by mouth for dizziness.  . metFORMIN (GLUCOPHAGE) 500 MG tablet Take 1 tablet (500 mg total) by mouth daily with breakfast.   . Misc. Devices (ACAPELLA) MISC Use as directed  . Multiple Vitamin (MULTIVITAMIN WITH MINERALS) TABS tablet Take 1 tablet by mouth daily.  . nitroGLYCERIN (NITROSTAT) 0.4 MG SL tablet Place 1 tablet (0.4 mg total) under the tongue every 5 (five) minutes as needed for chest pain (up to 3 doses only.). (Patient taking differently: Place 0.4 mg under the tongue every 5 (five) minutes x 3 doses as needed for chest pain (up to 3 doses only.).)  . pravastatin (PRAVACHOL) 40 MG tablet Take 1 tablet (40 mg total) by mouth daily. (Patient taking differently: Take 40 mg by mouth at bedtime.)  . [DISCONTINUED] metoprolol tartrate (LOPRESSOR) 25 MG tablet Take 0.5 tablets (12.5 mg total) by mouth 2 (two) times daily.     Allergies:   Patient has no known allergies.   Social History   Tobacco Use  . Smoking status: Former Smoker    Packs/day: 1.50    Years: 39.00    Pack years: 58.50    Types: Cigarettes    Quit date: 08/24/1992    Years since quitting: 28.0  . Smokeless tobacco: Never Used  Vaping Use  . Vaping Use: Never used  Substance Use Topics  . Alcohol use: No  . Drug use: No     Family Hx: The patient's family history includes Asthma in his mother; Cancer in his mother; Heart disease in his father and mother. There is no history of Prostate cancer or Colon cancer.  ROS:   Please see the history of present illness.     All other systems reviewed and are negative.   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 06/28/17 09/19/2020 Flutter rate 70 ICRBBB  Recent Labs: 12/13/2019: ALT 12 06/19/2020: TSH 1.96 08/29/2020: BUN 14; Creatinine, Ser 0.97; Hemoglobin 15.9; Platelets 278; Potassium 5.4; Sodium 136   Recent Lipid Panel Lab Results  Component Value Date/Time   CHOL 152 08/09/2019 09:30 AM   TRIG 117.0 08/09/2019 09:30 AM   TRIG 181 09/06/2009 04:02 PM    HDL 41.00 08/09/2019 09:30 AM   CHOLHDL 4 08/09/2019 09:30 AM   LDLCALC 87 08/09/2019 09:30 AM    Wt Readings from Last 3 Encounters:  09/19/20 79.2 kg  09/05/20 76.2 kg  08/29/20 76.2 kg     Objective:    Vital Signs:  BP (!) 140/52   Pulse 69   Ht 5\' 9"  (1.753 m)   Wt 79.2 kg   SpO2 90%   BMI 25.78 kg/m    Affect appropriate Healthy:  appears stated age 29: normal Neck supple with no adenopathy JVP normal no bruits no thyromegaly Lungs clear with no wheezing and good diaphragmatic motion Heart:  S1/S2 no murmur, no  rub, gallop or click PMI normal Abdomen: benighn, BS positve, no tenderness, no AAA no bruit.  No HSM or HJR Distal pulses intact with no bruits No edema Neuro non-focal Skin warm and dry No muscular weakness   ASSESSMENT & PLAN:    CAD  DES to RCA in 2014 Continue medical Rx New nitro called in no angina   HTN: Well controlled.  Continue current medications and low sodium Dash type diet.    AAA: upper normal 2.5 cm  No abdominal pain observe given age   DM:: Discussed low carb diet.  Target hemoglobin A1c is 6.5 or less.  Continue current medications.  Chol: on statin LDL at goal given age  COPD: no active wheezing continue [place] f/u pulmonary Dr [name]    PAF:  Diagnosed 07/29/20 Norvasc d/c started on lopressor and eliquis Plavix d/c  Successful [facility] 09/05/20 with bradycardia dose of lopressor decreased further Has Monitor on now to look for PAF/long pauses post [facility]    COVID-19 Education: The signs and symptoms of COVID-19 were discussed with the patient and how to seek care for testing (follow up with PCP or arrange E-visit).  The importance of social distancing was discussed today.  Time:   Today, I have spent 30 minutes with the patient  .     Medication Adjustments/Labs and Tests Ordered: Current medicines are reviewed at length with the patient today.  Concerns regarding medicines are outlined above.   Tests  Ordered:  Nonte monitor pending   Medication Changes:  Decrease lopressor to 12.5 mg daily   Disposition:  Follow up 4-6 weeks    Signed, Jenkins Rouge, MD  09/19/2020 5:06 PM    [place]

## 2020-09-11 ENCOUNTER — Ambulatory Visit: Payer: PPO | Admitting: Podiatry

## 2020-09-18 ENCOUNTER — Telehealth: Payer: Self-pay | Admitting: Pharmacist

## 2020-09-18 ENCOUNTER — Ambulatory Visit: Payer: HMO | Admitting: Podiatry

## 2020-09-18 ENCOUNTER — Other Ambulatory Visit: Payer: Self-pay

## 2020-09-18 DIAGNOSIS — E0843 Diabetes mellitus due to underlying condition with diabetic autonomic (poly)neuropathy: Secondary | ICD-10-CM

## 2020-09-18 DIAGNOSIS — L97512 Non-pressure chronic ulcer of other part of right foot with fat layer exposed: Secondary | ICD-10-CM | POA: Diagnosis not present

## 2020-09-18 NOTE — Progress Notes (Signed)
   Subjective:  87 y.o. male  presenting today for follow-up evaluation of an ulcer to the plantar aspect of the right first MTPJ.  Patient has a very high arch foot that causes a lot of pressure to the area.  Patient states that he has no pain and no new concerns at this time.  He has been applying the antibiotic cream with some modest improvement.  He presents for further treatment and evaluation  Past Medical History:  Diagnosis Date  . Allergic rhinitis   . Aortic dissection ([place])    f/u by cards - AAA with distal dissection.  [name] CAD (coronary artery disease)    a. s/p MI 1992. b. Prior hx stenting to RCA/Cx. b. 05/2013: [place] s/p DES to distal RCA then staged DES to prox LAD.   [name] COPD with asthma ([place])   . Diabetes mellitus   . Hyperlipidemia   . Lichen planus    Right midline inferior chest, Dr. [name] Sensing  . SCC (squamous cell carcinoma)   . Shingles       Objective/Physical Exam General: The patient is alert and oriented x3 in no acute distress.  Dermatology:  Wound #1 noted to the 0.2x0.2 x 0.1 cm (LxWxD).  Slightly improved since last visit  To the noted ulceration(s), there is no eschar. There is a moderate amount of slough, fibrin, and necrotic tissue noted. Granulation tissue and wound base is red. There is a minimal amount of serosanguineous drainage noted. There is no exposed bone muscle-tendon ligament or joint. There is no malodor. Periwound integrity is intact. Skin is warm, dry and supple bilateral lower extremities.  Vascular: Palpable pedal pulses bilaterally. No edema or erythema noted. Capillary refill within normal limits.  Neurological: Epicritic and protective threshold diminished bilaterally.   Musculoskeletal Exam: Range of motion within normal limits to all pedal and ankle joints bilateral. Muscle strength 5/5 in all groups bilateral. Plantarflexed first ray.   Assessment: 1.  Ulcer subfirst MTPJ right foot secondary to diabetes mellitus  Plan of  Care:  1. Patient was evaluated.   2. medically necessary excisional debridement including subcutaneous tissue was performed using a tissue nipper and a chisel blade. Excisional debridement of all the necrotic nonviable tissue down to healthy bleeding viable tissue was performed with post-debridement measurements same as pre-. 3. the wound was cleansed and dry sterile dressing applied. 4.  Continue gentamicin cream daily 5.  Continue offloading dancers pad to the insoles of the shoes 6.  Patient is to return to clinic in 6 weeks.   Phillip Jordan, DPM [facility]  Dr. Edrick Jordan, DPM    [address], [place] [address]                Office [phone]  Fax [phone]

## 2020-09-18 NOTE — Progress Notes (Addendum)
Chronic Care Management Pharmacy Assistant   Name: Phillip Jordan  MRN: [id number] DOB: [birth year]  Reason for Encounter: Disease State for DM and HTN.  Patient Questions:  1.  Have you seen any other providers since your last visit? Yes.   2.  Any changes in your medicines or health? Yes.    PCP : Colon Branch, MD   Their chronic conditions include: COPD, DM, HTN, HLD, CAD, Vertigo  Office Visits: 06/19/20 Dr. [name] check up on blood pressure. INCREASED Amlodipine Besylate to 5 mg.  Consults: 01/26/2 Podiatry [name], DPM. Right foot healing well. No medication changes.  09/06/20 (Telephone) DECREASED Metoprolol Tartrate to 12.5 mg two times daily.  08/29/20 Cardio [name], MD. No medication changes.  08/05/20 Podiatry [name], DPM. Check up on his right foot . No medication changes.  07/29/20 Cardio [name], MD. STARTED Apixaban 5 mg and Metoprolol Tartrate 25 mg. STOPPED Amlodipine 5 mg and Clopidogrel Bisulfate 75 mg. 07/08/20 Podiatry [name], DPM. Check up on his right foot. CONTINUE gentamicin cream. No medication changes.  06/17/20 Podiatry [name], DPM. Ulcer on right foot. No medication changes. 06/13/20 Otolaryngology, Jolene Provost Ingegerd Charlotta, PA-C F/U. Infection resolved.  06/13/20 Ophthalmology [name]. No information given.  06/04/20 (Telephone) Pulmonogly Dr. [name]. Wheezing. STARTED Prednisone 10 mg for 6 days. 05/30/20 Otolaryngology, Jolene Provost Ingegerd Charlotta, PA-C for hearing loss. STARTED neomycin-polymyxin-hydrocortisone (CORTISPORIN) 3.5-10,000-1 mg/mL-unit/mL-% otic suspension till 06/09/20  Hospital: 09/05/20 [name], MD. Cardioversion. No medication changes.   Allergies:  No Known Allergies  Medications: Outpatient Encounter Medications as of 09/18/2020  Medication Sig   albuterol (VENTOLIN HFA) 108 (90 Base) MCG/ACT inhaler Inhale 2 puffs into the lungs every 4 (four)  hours as needed for wheezing or shortness of breath.   apixaban (ELIQUIS) 5 MG TABS tablet Take 1 tablet (5 mg total) by mouth 2 (two) times daily.   aspirin EC 81 MG tablet Take 81 mg by mouth daily. Swallow whole.   azelastine (ASTELIN) 0.1 % nasal spray Place 2 sprays into both nostrils at bedtime as needed for rhinitis. Use in each nostril as directed   budesonide-formoterol (SYMBICORT) 160-4.5 MCG/ACT inhaler Take 2 puffs first thing in am and then another 2 puffs about 12 hours later. (Patient not taking: Reported on 08/30/2020)   fluticasone (FLONASE) 50 MCG/ACT nasal spray Place 2 sprays into both nostrils daily as needed for allergies or rhinitis.   Fluticasone-Salmeterol (ADVAIR) 250-50 MCG/DOSE AEPB Inhale 1 puff into the lungs 2 (two) times daily.   gentamicin ointment (GARAMYCIN) 0.1 % Apply 1 application topically daily. Applied to right foot   glucose blood (FREESTYLE TEST STRIPS) test strip Check blood sugar no more than twice daily   isosorbide mononitrate (IMDUR) 30 MG 24 hr tablet Take 1 tablet (30 mg total) by mouth 2 (two) times daily.   meclizine (ANTIVERT) 12.5 MG tablet Take 1 tablet (12.5 mg total) 3 (three) times daily as needed by mouth for dizziness.   metFORMIN (GLUCOPHAGE) 500 MG tablet Take 1 tablet (500 mg total) by mouth daily with breakfast.   metoprolol tartrate (LOPRESSOR) 25 MG tablet Take 0.5 tablets (12.5 mg total) by mouth 2 (two) times daily.   Misc. Devices (ACAPELLA) MISC Use as directed   Multiple Vitamin (MULTIVITAMIN WITH MINERALS) TABS tablet Take 1 tablet by mouth daily.   nitroGLYCERIN (NITROSTAT) 0.4 MG SL tablet Place 1 tablet (0.4 mg total) under the tongue  every 5 (five) minutes as needed for chest pain (up to 3 doses only.). (Patient taking differently: No sig reported)   pravastatin (PRAVACHOL) 40 MG tablet Take 1 tablet (40 mg total) by mouth daily. (Patient taking differently: Take 40 mg by mouth at bedtime.)   No facility-administered  encounter medications on file as of 09/18/2020.    Current Diagnosis: Patient Active Problem List   Diagnosis Date Noted   SCC (squamous cell carcinoma) 10/16/2019   Diabetic neuropathy ([place]) 05/23/2019   Hypertension, essential 09/01/2018   COPD exacerbation ([place]) 07/13/2016   Sinusitis, chronic 11/08/2015   Follow-up --------------PCP NOTES 05/02/2015   Vertigo 06/26/2014   Palpitations 08/29/2013   Hoarseness of voice 06/17/2011   Annual physical exam 05/26/2011   BUNDLE BRANCH BLOCK, RIGHT 01/10/2009   Allergic rhinitis 08/16/2007   Type 2 diabetes, controlled, with retinopathy ([place]) 06/30/2007   Hyperlipidemia 06/30/2007   CAD (coronary artery disease) 06/30/2007   Dissection of aorta ([place]) 06/30/2007   COPD GOLD III 06/30/2007    Goals Addressed   None    Recent Relevant Labs: Lab Results  Component Value Date/Time   HGBA1C 6.5 (H) 06/19/2020 01:43 PM   HGBA1C 6.5 12/13/2019 03:26 PM   MICROALBUR 2.8 (H) 07/06/2016 11:32 AM   MICROALBUR 2.5 (H) 05/02/2015 11:06 AM    Kidney Function Lab Results  Component Value Date/Time   CREATININE 0.97 08/29/2020 11:40 AM   CREATININE 1.09 06/24/2020 10:21 AM   CREATININE 1.03 06/19/2020 01:43 PM   GFR 62.58 12/13/2019 03:26 PM   GFRNONAA 69 08/29/2020 11:40 AM   GFRAA 80 08/29/2020 11:40 AM    Current antihyperglycemic regimen:  Metformin 500 mg daily  What recent interventions/DTPs have been made to improve glycemic control:  None.  Have there been any recent hospitalizations or ED visits since last visit with CPP? 09/05/20 for a Cardioversion.   Patient wife reports hypoglycemic symptoms, including Nervous/irritable   Patient wife reports hyperglycemic symptoms, including none   How often are you checking your blood sugar? Patient wife stated he doesn't really check his sugar, only when he feels symptomatic.    What are your blood sugars ranging?  Fasting: N/A Before meals: N/A After meals: N/A Bedtime:  N/A  During the week, how often does your blood glucose drop below 70? Patient wife stated he doesn't really check his sugar, only when he feels symptomatic.    Are you checking your feet daily/regularly?  Patient wife stated he goes to the foot doctor regularly and they have been treating a area on the bottom of his right foot.She stated the doctor said his foot was healing well and was told to come back in 6 weeks.   Adherence Review: Is the patient currently on a STATIN medication? Yes, Pravastatin 40 mg.   Is the patient currently on ACE/ARB medication? No.  Does the patient have >5 day gap between last estimated fill dates? No, CPP Please Check.  Reviewed chart prior to disease state call. Spoke with patient regarding BP  Recent Office Vitals: BP Readings from Last 3 Encounters:  09/05/20 (!) 117/49  08/29/20 (!) 150/80  07/29/20 (!) 144/74   Pulse Readings from Last 3 Encounters:  09/05/20 (!) 42  08/29/20 66  07/29/20 68    Wt Readings from Last 3 Encounters:  09/05/20 168 lb (76.2 kg)  08/29/20 168 lb (76.2 kg)  07/29/20 169 lb 6.4 oz (76.8 kg)     Kidney Function Lab Results  Component Value Date/Time  CREATININE 0.97 08/29/2020 11:40 AM   CREATININE 1.09 06/24/2020 10:21 AM   CREATININE 1.03 06/19/2020 01:43 PM   GFR 62.58 12/13/2019 03:26 PM   GFRNONAA 69 08/29/2020 11:40 AM   GFRAA 80 08/29/2020 11:40 AM    BMP Latest Ref Rng & Units 08/29/2020 06/24/2020 06/19/2020  Glucose 65 - 99 mg/dL 87 209(H) 120(H)  BUN 8 - 27 mg/dL 14 12 14   Creatinine 0.76 - 1.27 mg/dL 0.97 1.09 1.03  BUN/Creat Ratio 10 - 24 14 NOT APPLICABLE NOT APPLICABLE  Sodium Q000111Q - 144 mmol/L 136 135 135  Potassium 3.5 - 5.2 mmol/L 5.4(H) 5.3 5.7(H)  Chloride 96 - 106 mmol/L 96 97(L) 97(L)  CO2 20 - 29 mmol/L 25 28 27   Calcium 8.6 - 10.2 mg/dL 10.1 10.0 9.9    Current antihypertensive regimen:  Amlodipine 2.5mg  daily Telmisartan 20 mg daily  How often are you checking your Blood  Pressure? 1-2x per week   Current home BP readings:  09/17/20 134/79 AM HR 59 09/17/20 143/66 PM HR 50 01/22/22140/76 AM HR 59 09/14/20 146/78 PM HR 51  What recent interventions/DTPs have been made by any provider to improve Blood Pressure control since last CPP Visit: None.  Any recent hospitalizations or ED visits since last visit with CPP? Yes on 09/05/20 for a Cardioversion.   What diet changes have been made to improve Blood Pressure Control?  Patient wife he doesn't get up early enough for breakfast, he eats about twice a day. She stated he eats tuna, soup, P&J crackers/sandwiches and grilled cheese sandwiches, etc. She stated he doesn't eat much beef.  What exercise is being done to improve your Blood Pressure Control?  Patient stated he is active but doesn't have a exercise routine.She stated he does household duties.  Adherence Review: Is the patient currently on ACE/ARB medication? No.   Does the patient have >5 day gap between last estimated fill dates? Telmisartan not filled in report since 03/29/20 - patient has supply on hand.  Patient wife stated she wanted to see if her husband could get any patient assistance for his Eliquis 5 mg.I informed her I would look into this for her and get the application prepared for her if he is eligible.      Follow-Up:  Pharmacist Review   Charlann Lange, RMA Clinical Pharmacist Assistant [phone]  6 minutes spent in review, coordination, and documentation.  Reviewed by: Beverly Milch, PharmD Clinical Pharmacist [facility] [phone]

## 2020-09-19 ENCOUNTER — Ambulatory Visit (INDEPENDENT_AMBULATORY_CARE_PROVIDER_SITE_OTHER): Payer: HMO | Admitting: Cardiovascular Disease

## 2020-09-19 ENCOUNTER — Encounter: Payer: Self-pay | Admitting: Cardiovascular Disease

## 2020-09-19 ENCOUNTER — Telehealth: Payer: Self-pay | Admitting: Pharmacist

## 2020-09-19 VITALS — BP 140/52 | HR 69 | Ht 69.0 in | Wt 174.6 lb

## 2020-09-19 DIAGNOSIS — I480 Paroxysmal atrial fibrillation: Secondary | ICD-10-CM

## 2020-09-19 MED ORDER — METOPROLOL TARTRATE 25 MG PO TABS
12.5000 mg | ORAL_TABLET | Freq: Every day | ORAL | 3 refills | Status: DC
Start: 2020-09-19 — End: 2021-08-11

## 2020-09-19 NOTE — Progress Notes (Addendum)
Chronic Care Management Pharmacy Assistant   Name: Phillip Jordan  MRN: [id number] DOB: March 22, 1930  Reason for Encounter: PAP  Patient Questions:  1.  Have you seen any other providers since your last visit? No.   2.  Any changes in your medicines or health? No.  PCP : Colon Branch, MD  Allergies:  No Known Allergies  Medications: Outpatient Encounter Medications as of 09/19/2020  Medication Sig   albuterol (VENTOLIN HFA) 108 (90 Base) MCG/ACT inhaler Inhale 2 puffs into the lungs every 4 (four) hours as needed for wheezing or shortness of breath.   apixaban (ELIQUIS) 5 MG TABS tablet Take 1 tablet (5 mg total) by mouth 2 (two) times daily.   aspirin EC 81 MG tablet Take 81 mg by mouth daily. Swallow whole.   azelastine (ASTELIN) 0.1 % nasal spray Place 2 sprays into both nostrils at bedtime as needed for rhinitis. Use in each nostril as directed   budesonide-formoterol (SYMBICORT) 160-4.5 MCG/ACT inhaler Take 2 puffs first thing in am and then another 2 puffs about 12 hours later. (Patient not taking: Reported on 08/30/2020)   fluticasone (FLONASE) 50 MCG/ACT nasal spray Place 2 sprays into both nostrils daily as needed for allergies or rhinitis.   Fluticasone-Salmeterol (ADVAIR) 250-50 MCG/DOSE AEPB Inhale 1 puff into the lungs 2 (two) times daily.   gentamicin ointment (GARAMYCIN) 0.1 % Apply 1 application topically daily. Applied to right foot   glucose blood (FREESTYLE TEST STRIPS) test strip Check blood sugar no more than twice daily   isosorbide mononitrate (IMDUR) 30 MG 24 hr tablet Take 1 tablet (30 mg total) by mouth 2 (two) times daily.   meclizine (ANTIVERT) 12.5 MG tablet Take 1 tablet (12.5 mg total) 3 (three) times daily as needed by mouth for dizziness.   metFORMIN (GLUCOPHAGE) 500 MG tablet Take 1 tablet (500 mg total) by mouth daily with breakfast.   metoprolol tartrate (LOPRESSOR) 25 MG tablet Take 0.5 tablets (12.5 mg total) by mouth 2 (two) times daily.   Misc.  Devices (ACAPELLA) MISC Use as directed   Multiple Vitamin (MULTIVITAMIN WITH MINERALS) TABS tablet Take 1 tablet by mouth daily.   nitroGLYCERIN (NITROSTAT) 0.4 MG SL tablet Place 1 tablet (0.4 mg total) under the tongue every 5 (five) minutes as needed for chest pain (up to 3 doses only.). (Patient taking differently: No sig reported)   pravastatin (PRAVACHOL) 40 MG tablet Take 1 tablet (40 mg total) by mouth daily. (Patient taking differently: Take 40 mg by mouth at bedtime.)   No facility-administered encounter medications on file as of 09/19/2020.    Current Diagnosis: Patient Active Problem List   Diagnosis Date Noted   SCC (squamous cell carcinoma) 10/16/2019   Diabetic neuropathy ([place]) 05/23/2019   Hypertension, essential 09/01/2018   COPD exacerbation ([place]) 07/13/2016   Sinusitis, chronic 11/08/2015   Follow-up --------------PCP NOTES 05/02/2015   Vertigo 06/26/2014   Palpitations 08/29/2013   Hoarseness of voice 06/17/2011   Annual physical exam 05/26/2011   BUNDLE BRANCH BLOCK, RIGHT 01/10/2009   Allergic rhinitis 08/16/2007   Type 2 diabetes, controlled, with retinopathy ([place]) 06/30/2007   Hyperlipidemia 06/30/2007   CAD (coronary artery disease) 06/30/2007   Dissection of aorta ([place]) 06/30/2007   COPD GOLD III 06/30/2007    Goals Addressed   None    Recent Relevant Labs: Lab Results  Component Value Date/Time   HGBA1C 6.5 (H) 06/19/2020 01:43 PM   HGBA1C 6.5 12/13/2019 03:26 PM   MICROALBUR  2.8 (H) 07/06/2016 11:32 AM   MICROALBUR 2.5 (H) 05/02/2015 11:06 AM    Kidney Function Lab Results  Component Value Date/Time   CREATININE 0.97 08/29/2020 11:40 AM   CREATININE 1.09 06/24/2020 10:21 AM   CREATININE 1.03 06/19/2020 01:43 PM   GFR 62.58 12/13/2019 03:26 PM   GFRNONAA 69 08/29/2020 11:40 AM   GFRAA 80 08/29/2020 11:40 AM   New patient assistance application form filled out to [employer] for Eliquis 5 mg . Waiting for patient and provider to  complete and sign documentation. Called patient to inquire if they wanted the application mailed to them or if they wanted to come into the office. Patient is required to sign application and to bring/have proof of income. His wife stated they would be willing to come into office to bring proof of income and sign application once they had all of their documentation, she would like it mailed to their residence address [address] [address].    Follow-Up:  Pharmacist Review   Charlann Lange, [facility] Pharmacist Assistant [phone]  2 minutes spent in review, coordination, and documentation.  Reviewed by: Beverly Milch, PharmD Clinical Pharmacist [facility] [phone]

## 2020-09-19 NOTE — Patient Instructions (Addendum)
Medication Instructions:  Your physician has recommended you make the following change in your medication:  1-Decrease metoprolol 12.5 mg by mouth daily.  *If you need a refill on your cardiac medications before your next appointment, please call your pharmacy*  Lab Work: If you have labs (blood work) drawn today and your tests are completely normal, you will receive your results only by: [name] MyChart Message (if you have MyChart) OR . A paper copy in the mail If you have any lab test that is abnormal or we need to change your treatment, we will call you to review the results.  Testing/Procedures: None ordered today.  Follow-Up: At [facility], you and your health needs are our priority.  As part of our continuing mission to provide you with exceptional heart care, we have created designated Provider Care Teams.  These Care Teams include your primary Cardiologist (physician) and Advanced Practice Providers (APPs -  Physician Assistants and Nurse Practitioners) who all work together to provide you with the care you need, when you need it.  We recommend signing up for the patient portal called "MyChart".  Sign up information is provided on this After Visit Summary.  MyChart is used to connect with patients for Virtual Visits (Telemedicine).  Patients are able to view lab/test results, encounter notes, upcoming appointments, etc.  Non-urgent messages can be sent to your provider as well.   To learn more about what you can do with MyChart, go to [email or web address].    Your next appointment:   At the End of February  The format for your next appointment:   In Person  Provider:   You may see Dr. [name] or one of the following Advanced Practice Providers on your designated Care Team:    Kathyrn Drown, NP

## 2020-10-02 ENCOUNTER — Telehealth: Payer: Self-pay | Admitting: Cardiovascular Disease

## 2020-10-02 DIAGNOSIS — R001 Bradycardia, unspecified: Secondary | ICD-10-CM

## 2020-10-02 DIAGNOSIS — R000 Tachycardia, unspecified: Secondary | ICD-10-CM

## 2020-10-02 DIAGNOSIS — R9431 Abnormal electrocardiogram [ECG] [EKG]: Secondary | ICD-10-CM

## 2020-10-02 DIAGNOSIS — I4892 Unspecified atrial flutter: Secondary | ICD-10-CM | POA: Diagnosis not present

## 2020-10-02 NOTE — Telephone Encounter (Signed)
Phillip Jordan from [facility] is calling to report abnormal results. Please advise.

## 2020-10-02 NOTE — Telephone Encounter (Signed)
Dr. [name] already review monitor and he is aware of results. Per Dr. [name], Refer to EP for symptomatic tachybrady documented multiple 3 -4 second pauses ? PPM. Will place order for EP referral. Informed patient that someone will call him to schedule appointment.

## 2020-10-02 NOTE — Telephone Encounter (Signed)
Spoke with Sharyn Lull at [facility]. Reporting that on 09/17/20 at 12:45pm - pt had symptomatic bradycardia 39 bpm x 30 sec and that report is posted.

## 2020-10-08 DIAGNOSIS — L821 Other seborrheic keratosis: Secondary | ICD-10-CM | POA: Diagnosis not present

## 2020-10-08 DIAGNOSIS — D225 Melanocytic nevi of trunk: Secondary | ICD-10-CM | POA: Diagnosis not present

## 2020-10-08 DIAGNOSIS — D2371 Other benign neoplasm of skin of right lower limb, including hip: Secondary | ICD-10-CM | POA: Diagnosis not present

## 2020-10-08 DIAGNOSIS — Z85828 Personal history of other malignant neoplasm of skin: Secondary | ICD-10-CM | POA: Diagnosis not present

## 2020-10-08 DIAGNOSIS — L570 Actinic keratosis: Secondary | ICD-10-CM | POA: Diagnosis not present

## 2020-10-08 DIAGNOSIS — L814 Other melanin hyperpigmentation: Secondary | ICD-10-CM | POA: Diagnosis not present

## 2020-10-08 DIAGNOSIS — D692 Other nonthrombocytopenic purpura: Secondary | ICD-10-CM | POA: Diagnosis not present

## 2020-10-11 ENCOUNTER — Encounter: Payer: Self-pay | Admitting: Internal Medicine

## 2020-10-11 ENCOUNTER — Ambulatory Visit (INDEPENDENT_AMBULATORY_CARE_PROVIDER_SITE_OTHER): Payer: HMO | Admitting: Internal Medicine

## 2020-10-11 ENCOUNTER — Other Ambulatory Visit: Payer: Self-pay

## 2020-10-11 DIAGNOSIS — I495 Sick sinus syndrome: Secondary | ICD-10-CM | POA: Diagnosis not present

## 2020-10-11 LAB — CBC WITH DIFFERENTIAL/PLATELET
Basophils Absolute: 0.1 10*3/uL (ref 0.0–0.2)
Basos: 1 %
EOS (ABSOLUTE): 0.3 10*3/uL (ref 0.0–0.4)
Eos: 3 %
Hematocrit: 44.1 % (ref 37.5–51.0)
Hemoglobin: 15.2 g/dL (ref 13.0–17.7)
Immature Grans (Abs): 0.1 10*3/uL (ref 0.0–0.1)
Immature Granulocytes: 1 %
Lymphocytes Absolute: 1.6 10*3/uL (ref 0.7–3.1)
Lymphs: 16 %
MCH: 30.7 pg (ref 26.6–33.0)
MCHC: 34.5 g/dL (ref 31.5–35.7)
MCV: 89 fL (ref 79–97)
Monocytes Absolute: 1.2 10*3/uL — ABNORMAL HIGH (ref 0.1–0.9)
Monocytes: 11 %
Neutrophils Absolute: 7.1 10*3/uL — ABNORMAL HIGH (ref 1.4–7.0)
Neutrophils: 68 %
Platelets: 282 10*3/uL (ref 150–450)
RBC: 4.95 x10E6/uL (ref 4.14–5.80)
RDW: 11.7 % (ref 11.6–15.4)
WBC: 10.3 10*3/uL (ref 3.4–10.8)

## 2020-10-11 LAB — BASIC METABOLIC PANEL
BUN/Creatinine Ratio: 15 (ref 10–24)
BUN: 14 mg/dL (ref 8–27)
CO2: 22 mmol/L (ref 20–29)
Calcium: 9.6 mg/dL (ref 8.6–10.2)
Chloride: 94 mmol/L — ABNORMAL LOW (ref 96–106)
Creatinine, Ser: 0.92 mg/dL (ref 0.76–1.27)
GFR calc Af Amer: 86 mL/min/{1.73_m2} (ref >59)
GFR calc non Af Amer: 74 mL/min/{1.73_m2} (ref >59)
Glucose: 111 mg/dL — ABNORMAL HIGH (ref 65–99)
Potassium: 4.8 mmol/L (ref 3.5–5.2)
Sodium: 134 mmol/L (ref 134–144)

## 2020-10-11 NOTE — H&P (View-Only) (Signed)
HPI Phillip Jordan is referred by Dr. 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 He is a pleasant 87yo man with a h/o HTN who developed atrial flutter with a controlled VR and underwent DCCV. He was quite bradycardic after and wore a cardiac monitor which demonstrated mostly nocturnal but some daytime pauses due to sinus node dysfunction. He has not had syncope. He denies chest pain but does have generalized fatigue. He used to work out but in covid has not. He denies angina though has has known CAD.  No Known Allergies   Current Outpatient Medications  Medication Sig Dispense Refill  . albuterol (VENTOLIN HFA) 108 (90 Base) MCG/ACT inhaler Inhale 2 puffs into the lungs every 4 (four) hours as needed for wheezing or shortness of breath. 9 g 5  . apixaban (ELIQUIS) 5 MG TABS tablet Take 1 tablet (5 mg total) by mouth 2 (two) times daily. 60 tablet 11  . aspirin EC 81 MG tablet Take 81 mg by mouth daily. Swallow whole.    [name] azelastine (ASTELIN) 0.1 % nasal spray Place 2 sprays into both nostrils at bedtime as needed for rhinitis. Use in each nostril as directed 30 mL 3  . budesonide-formoterol (SYMBICORT) 160-4.5 MCG/ACT inhaler Take 2 puffs first thing in am and then another 2 puffs about 12 hours later. 3 Inhaler 2  . fluticasone (FLONASE) 50 MCG/ACT nasal spray Place 2 sprays into both nostrils daily as needed for allergies or rhinitis.    . Fluticasone-Salmeterol (ADVAIR) 250-50 MCG/DOSE AEPB Inhale 1 puff into the lungs 2 (two) times daily.    [name] gentamicin ointment (GARAMYCIN) 0.1 % Apply 1 application topically daily. Applied to right foot    . glucose blood (FREESTYLE TEST STRIPS) test strip Check blood sugar no more than twice daily 200 each 12  . isosorbide mononitrate (IMDUR) 30 MG 24 hr tablet Take 1 tablet (30 mg total) by mouth 2 (two) times daily. 180 tablet 3  . meclizine (ANTIVERT) 12.5 MG tablet Take 1 tablet (12.5 mg total) 3 (three) times daily as needed by mouth for  dizziness. 30 tablet 0  . metFORMIN (GLUCOPHAGE) 500 MG tablet Take 1 tablet (500 mg total) by mouth daily with breakfast. 90 tablet 1  . metoprolol tartrate (LOPRESSOR) 25 MG tablet Take 0.5 tablets (12.5 mg total) by mouth daily. 45 tablet 3  . Misc. Devices (ACAPELLA) MISC Use as directed 1 each 0  . Multiple Vitamin (MULTIVITAMIN WITH MINERALS) TABS tablet Take 1 tablet by mouth daily.    . nitroGLYCERIN (NITROSTAT) 0.4 MG SL tablet Place 1 tablet (0.4 mg total) under the tongue every 5 (five) minutes as needed for chest pain (up to 3 doses only.). (Patient taking differently: Place 0.4 mg under the tongue every 5 (five) minutes x 3 doses as needed for chest pain (up to 3 doses only.).) 25 tablet 3  . pravastatin (PRAVACHOL) 40 MG tablet Take 1 tablet (40 mg total) by mouth daily. (Patient taking differently: Take 40 mg by mouth at bedtime.) 90 tablet 1   No current facility-administered medications for this visit.     Past Medical History:  Diagnosis Date  . Allergic rhinitis   . Aortic dissection ([place])    f/u by cards - AAA with distal dissection.  [name] CAD (coronary artery disease)    a. s/p MI 1992. b. Prior hx stenting to RCA/Cx. b. 05/2013: [place] s/p DES to distal RCA then staged DES to prox LAD.   [name]  COPD with asthma ([place])   . Diabetes mellitus   . Hyperlipidemia   . Lichen planus    Right midline inferior chest, Dr. [name] Sensing  . SCC (squamous cell carcinoma)   . Shingles     ROS:   All systems reviewed and negative except as noted in the HPI.   Past Surgical History:  Procedure Laterality Date  . BACK SURGERY  1993  . CARDIAC CATHETERIZATION     left, w/ coronary angiography and left ventriculograpy  . CARDIOVERSION N/A 09/05/2020   Procedure: CARDIOVERSION;  Surgeon: [name], MD;  Location: [facility] ENDOSCOPY;  Service: Cardiovascular;  Laterality: N/A;  . LEFT HEART CATHETERIZATION WITH CORONARY ANGIOGRAM N/A 06/09/2013   Procedure: LEFT HEART CATHETERIZATION WITH  CORONARY ANGIOGRAM;  Surgeon: [name], MD;  Location: [facility] CATH LAB;  Service: Cardiovascular;  Laterality: N/A;  . PERCUTANEOUS CORONARY STENT INTERVENTION (PCI-S) N/A 06/12/2013   Procedure: PERCUTANEOUS CORONARY STENT INTERVENTION (PCI-S);  Surgeon: [name], MD;  Location: [facility] CATH LAB;  Service: Cardiovascular;  Laterality: N/A;  prox LAD  . PTCA     w/ placement of drugeluting stent in the distal right coronary artery, and distal Cardiologist: [name]     Family History  Problem Relation Age of Onset  . Asthma Mother   . Heart disease Mother        CHF  . Cancer Mother        melanoma  . Heart disease Father        MI  . Prostate cancer Neg Hx   . Colon cancer Neg Hx      Social History   Socioeconomic History  . Marital status: Married    Spouse name: Not on file  . Number of children: 3  . Years of education: Not on file  . Highest education level: Not on file  Occupational History  . Occupation: retired  Tobacco Use  . Smoking status: Former Smoker    Packs/day: 1.50    Years: 39.00    Pack years: 58.50    Types: Cigarettes    Quit date: 08/24/1992    Years since quitting: 28.1  . Smokeless tobacco: Never Used  Vaping Use  . Vaping Use: Never used  Substance and Sexual Activity  . Alcohol use: No  . Drug use: No  . Sexual activity: Not on file  Other Topics Concern  . Not on file  Social History Narrative   Lives w/ wife    3 kids, 38 Gk-GGK   Social Determinants of Health   Financial Resource Strain: Low Risk   . Difficulty of Paying Living Expenses: Not very hard  Food Insecurity: Not on file  Transportation Needs: Not on file  Physical Activity: Not on file  Stress: Not on file  Social Connections: Not on file  Intimate Partner Violence: Not on file     BP (!) 148/76   Pulse 86   Ht 5\' 9"  (1.753 m)   Wt 173 lb (78.5 kg)   SpO2 94%   BMI 25.55 kg/m   Physical Exam:  Elderly but well appearing NAD HEENT:  Unremarkable Neck:  No JVD, no thyromegally Lymphatics:  No adenopathy Back:  No CVA tenderness Lungs:  Clear with no wheezes HEART:  Regular rate rhythm, no murmurs, no rubs, no clicks Abd:  soft, positive bowel sounds, no organomegally, no rebound, no guarding Ext:  2 plus pulses, no edema, no cyanosis, no clubbing Skin:  No rashes  no nodules Neuro:  CN II through XII intact, motor grossly intact  Assess/Plan: 1. Symptomatic tachy-brady - I have discussed the treatment options. I think with his advanced age, PPM insertion alone rather than flutter ablation most reasonable.  2. Atrial flutter - I discussed catheter ablation but he has held NSR and would be likely to develop atrial fib if we did a flutter ablation based on his advanced age. 3. Coags - he has not had any bleeding. We will hold prior to PPM.  Carleene Overlie Janneth Krasner,MD

## 2020-10-11 NOTE — Progress Notes (Signed)
HPI [name] is referred by Dr. Johnsie Jordan for evaluation of tachy-brady syndrome. He is a pleasant 87yo man with a h/o HTN who developed atrial flutter with a controlled VR and underwent DCCV. He was quite bradycardic after and wore a cardiac monitor which demonstrated mostly nocturnal but some daytime pauses due to sinus node dysfunction. He has not had syncope. He denies chest pain but does have generalized fatigue. He used to work out but in covid has not. He denies angina though has has known CAD.  No Known Allergies   Current Outpatient Medications  Medication Sig Dispense Refill  . albuterol (VENTOLIN HFA) 108 (90 Base) MCG/ACT inhaler Inhale 2 puffs into the lungs every 4 (four) hours as needed for wheezing or shortness of breath. 9 g 5  . apixaban (ELIQUIS) 5 MG TABS tablet Take 1 tablet (5 mg total) by mouth 2 (two) times daily. 60 tablet 11  . aspirin EC 81 MG tablet Take 81 mg by mouth daily. Swallow whole.    [name] azelastine (ASTELIN) 0.1 % nasal spray Place 2 sprays into both nostrils at bedtime as needed for rhinitis. Use in each nostril as directed 30 mL 3  . budesonide-formoterol (SYMBICORT) 160-4.5 MCG/ACT inhaler Take 2 puffs first thing in am and then another 2 puffs about 12 hours later. 3 Inhaler 2  . fluticasone (FLONASE) 50 MCG/ACT nasal spray Place 2 sprays into both nostrils daily as needed for allergies or rhinitis.    . Fluticasone-Salmeterol (ADVAIR) 250-50 MCG/DOSE AEPB Inhale 1 puff into the lungs 2 (two) times daily.    [name] gentamicin ointment (GARAMYCIN) 0.1 % Apply 1 application topically daily. Applied to right foot    . glucose blood (FREESTYLE TEST STRIPS) test strip Check blood sugar no more than twice daily 200 each 12  . isosorbide mononitrate (IMDUR) 30 MG 24 hr tablet Take 1 tablet (30 mg total) by mouth 2 (two) times daily. 180 tablet 3  . meclizine (ANTIVERT) 12.5 MG tablet Take 1 tablet (12.5 mg total) 3 (three) times daily as needed by mouth for  dizziness. 30 tablet 0  . metFORMIN (GLUCOPHAGE) 500 MG tablet Take 1 tablet (500 mg total) by mouth daily with breakfast. 90 tablet 1  . metoprolol tartrate (LOPRESSOR) 25 MG tablet Take 0.5 tablets (12.5 mg total) by mouth daily. 45 tablet 3  . Misc. Devices (ACAPELLA) MISC Use as directed 1 each 0  . Multiple Vitamin (MULTIVITAMIN WITH MINERALS) TABS tablet Take 1 tablet by mouth daily.    . nitroGLYCERIN (NITROSTAT) 0.4 MG SL tablet Place 1 tablet (0.4 mg total) under the tongue every 5 (five) minutes as needed for chest pain (up to 3 doses only.). (Patient taking differently: Place 0.4 mg under the tongue every 5 (five) minutes x 3 doses as needed for chest pain (up to 3 doses only.).) 25 tablet 3  . pravastatin (PRAVACHOL) 40 MG tablet Take 1 tablet (40 mg total) by mouth daily. (Patient taking differently: Take 40 mg by mouth at bedtime.) 90 tablet 1   No current facility-administered medications for this visit.     Past Medical History:  Diagnosis Date  . Allergic rhinitis   . Aortic dissection (Phillip Jordan)    f/u by cards - AAA with distal dissection.  [name] CAD (coronary artery disease)    a. s/p MI 1992. b. Prior hx stenting to RCA/Cx. b. 05/2013: [place] s/p DES to distal RCA then staged DES to prox LAD.   [name]  COPD with asthma ([place])   . Diabetes mellitus   . Hyperlipidemia   . Lichen planus    Right midline inferior chest, Dr. [name] Jordan  . SCC (squamous cell carcinoma)   . Shingles     ROS:   All systems reviewed and negative except as noted in the HPI.   Past Surgical History:  Procedure Laterality Date  . BACK SURGERY  1993  . CARDIAC CATHETERIZATION     left, w/ coronary angiography and left ventriculograpy  . CARDIOVERSION N/A 09/05/2020   Procedure: CARDIOVERSION;  Surgeon: Phillip Hector, MD;  [facility] ENDOSCOPY;  Service: Cardiovascular;  Laterality: N/A;  . LEFT HEART CATHETERIZATION WITH CORONARY ANGIOGRAM N/A 06/09/2013   Procedure: LEFT HEART CATHETERIZATION WITH  CORONARY ANGIOGRAM;  Surgeon: Phillip Hector, MD;  Location: [facility] CATH LAB;  Service: Cardiovascular;  Laterality: N/A;  . PERCUTANEOUS CORONARY STENT INTERVENTION (PCI-S) N/A 06/12/2013   Procedure: PERCUTANEOUS CORONARY STENT INTERVENTION (PCI-S);  Surgeon: Phillip Blanks, MD;  [facility] CATH LAB;  Service: Cardiovascular;  Laterality: N/A;  prox LAD  . PTCA     w/ placement of drugeluting stent in the distal right coronary artery, and distal Cardiologist: Phillip Jordan     Family History  Problem Relation Age of Onset  . Asthma Mother   . Heart disease Mother        CHF  . Cancer Mother        melanoma  . Heart disease Father        MI  . Prostate cancer Neg Hx   . Colon cancer Neg Hx      Social History   Socioeconomic History  . Marital status: Married    Spouse name: Not on file  . Number of children: 3  . Years of education: Not on file  . Highest education level: Not on file  Occupational History  . Occupation: retired  Tobacco Use  . Smoking status: Former Smoker    Packs/day: 1.50    Years: 39.00    Pack years: 58.50    Types: Cigarettes    Quit date: 08/24/1992    Years since quitting: 28.1  . Smokeless tobacco: Never Used  Vaping Use  . Vaping Use: Never used  Substance and Sexual Activity  . Alcohol use: No  . Drug use: No  . Sexual activity: Not on file  Other Topics Concern  . Not on file  Social History Narrative   Lives w/ wife    3 kids, 66 Gk-GGK   Social Determinants of Health   Financial Resource Strain: Low Risk   . Difficulty of Paying Living Expenses: Not very hard  Food Insecurity: Not on file  Transportation Needs: Not on file  Physical Activity: Not on file  Stress: Not on file  Social Connections: Not on file  Intimate Partner Violence: Not on file     BP (!) 148/76   Pulse 86   Ht 5\' 9"  (1.753 m)   Wt 173 lb (78.5 kg)   SpO2 94%   BMI 25.55 kg/m   Physical Exam:  Elderly but well appearing NAD HEENT:  Unremarkable Neck:  No JVD, no thyromegally Lymphatics:  No adenopathy Back:  No CVA tenderness Lungs:  Clear with no wheezes HEART:  Regular rate rhythm, no murmurs, no rubs, no clicks Abd:  soft, positive bowel sounds, no organomegally, no rebound, no guarding Ext:  2 plus pulses, no edema, no cyanosis, no clubbing Skin:  No rashes  no nodules Neuro:  CN II through XII intact, motor grossly intact  Assess/Plan: 1. Symptomatic tachy-brady - I have discussed the treatment options. I think with his advanced age, PPM insertion alone rather than flutter ablation most reasonable.  2. Atrial flutter - I discussed catheter ablation but he has held NSR and would be likely to develop atrial fib if we did a flutter ablation based on his advanced age. 3. Coags - he has not had any bleeding. We will hold prior to PPM.  Carleene Overlie Hollis Tuller Holster,MD

## 2020-10-11 NOTE — Patient Instructions (Addendum)
Medication Instructions:  Your physician recommends that you continue on your current medications as directed. Please refer to the Current Medication list given to you today.  Labwork: You will get lab work today:  BMP and CBC  Testing/Procedures: None ordered.  Follow-Up:  SEE INSTRUCTION LETTER  Any Other Special Instructions Will Be Listed Below (If Applicable).  If you need a refill on your cardiac medications before your next appointment, please call your pharmacy.    Pacemaker Implantation, Adult Pacemaker implantation is a procedure to place a pacemaker inside the chest. A pacemaker is a small computer that sends electrical signals to the heart and helps the heart beat normally. A pacemaker also stores information about heart rhythms. You may need pacemaker implantation if you have:  A slow heartbeat (bradycardia).  Loss of consciousness that happens repeatedly (syncope) or repeated episodes of dizziness or light-headedness because of an irregular heart rate.  Shortness of breath (dyspnea) due to heart problems. The pacemaker usually attaches to your heart through a wire called a lead. One or two leads may be needed. There are different types of pacemakers:  Transvenous pacemaker. This type is placed under the skin or muscle of your upper chest area. The lead goes through a vein in the chest area to reach the inside of the heart.  Epicardial pacemaker. This type is placed under the skin or muscle of your chest or abdomen. The lead goes through your chest to the outside of the heart. Tell a health care provider about:  Any allergies you have.  All medicines you are taking, including vitamins, herbs, eye drops, creams, and over-the-counter medicines.  Any problems you or family members have had with anesthetic medicines.  Any blood or bone disorders you have.  Any surgeries you have had.  Any medical conditions you have.  Whether you are pregnant or may be  pregnant. What are the risks? Generally, this is a safe procedure. However, problems may occur, including:  Infection.  Bleeding.  Failure of the pacemaker or the lead.  Collapse of a lung or bleeding into a lung.  Blood clot inside a blood vessel with a lead.  Damage to the heart.  Infection inside the heart (endocarditis).  Allergic reactions to medicines. What happens before the procedure? Staying hydrated Follow instructions from your health care provider about hydration, which may include:  Up to 2 hours before the procedure - you may continue to drink clear liquids, such as water, clear fruit juice, black coffee, and plain tea.   Eating and drinking restrictions Follow instructions from your health care provider about eating and drinking, which may include:  8 hours before the procedure - stop eating heavy meals or foods, such as meat, fried foods, or fatty foods.  6 hours before the procedure - stop eating light meals or foods, such as toast or cereal.  6 hours before the procedure - stop drinking milk or drinks that contain milk.  2 hours before the procedure - stop drinking clear liquids. Medicines Ask your health care provider about:  Changing or stopping your regular medicines. This is especially important if you are taking diabetes medicines or blood thinners.  Taking medicines such as aspirin and ibuprofen. These medicines can thin your blood. Do not take these medicines unless your health care provider tells you to take them.  Taking over-the-counter medicines, vitamins, herbs, and supplements. Tests You may have:  A heart evaluation. This may include: ? An electrocardiogram (ECG). This involves placing patches on  your skin to check your heart rhythm. ? A chest X-ray. ? An echocardiogram. This is a test that uses sound waves (ultrasound) to produce an image of the heart. ? A cardiac rhythm monitor. This is used to record your heart rhythm and any events  for a longer period of time.  Blood tests.  Genetic testing. General instructions  Do not use any products that contain nicotine or tobacco for at least 4 weeks before the procedure. These products include cigarettes, e-cigarettes, and chewing tobacco. If you need help quitting, ask your health care provider.  Ask your health care provider: ? How your surgery site will be marked. ? What steps will be taken to help prevent infection. These steps may include:  Removing hair at the surgery site.  Washing skin with a germ-killing soap.  Receiving antibiotic medicine.  Plan to have someone take you home from the hospital or clinic.  If you will be going home right after the procedure, plan to have someone with you for 24 hours. What happens during the procedure?  An IV will be inserted into one of your veins.  You will be given one or more of the following: ? A medicine to help you relax (sedative). ? A medicine to numb the area (local anesthetic). ? A medicine to make you fall asleep (general anesthetic).  The next steps vary depending on the type of pacemaker you will be getting. ? If you are getting a transvenous pacemaker:  An incision will be made in your upper chest.  A pocket will be made for the pacemaker. It may be placed under the skin or between layers of muscle.  The lead will be inserted into a blood vessel that goes to the heart.  While X-rays are taken by an imaging machine (fluoroscopy), the lead will be advanced through the vein to the inside of your heart.  The other end of the lead will be tunneled under the skin and attached to the pacemaker. ? If you are getting an epicardial pacemaker:  An incision will be made near your ribs or breastbone (sternum) for the lead.  The lead will be attached to the outside of your heart.  Another incision will be made in your chest or upper abdomen to create a pocket for the pacemaker.  The free end of the lead will  be tunneled under the skin and attached to the pacemaker.  The transvenous or epicardial pacemaker will be tested. Imaging studies may be done to check the lead position.  The incisions will be closed with stitches (sutures), adhesive strips, or skin glue.  Bandages (dressings) will be placed over the incisions. The procedure may vary among health care providers and hospitals. What happens after the procedure?  Your blood pressure, heart rate, breathing rate, and blood oxygen level will be monitored until you leave the hospital or clinic.  You may be given antibiotics.  You will be given pain medicine.  An ECG and chest X-rays will be done.  You may need to wear a continuous type of ECG (Holter monitor) to check your heart rhythm.  Your health care provider will program the pacemaker.  If you were given a sedative during the procedure, it can affect you for several hours. Do not drive or operate machinery until your health care provider says that it is safe.  You will be given a pacemaker identification card. This card lists the implant date, device model, and manufacturer of your pacemaker. Summary  A pacemaker is a small computer that sends electrical signals to the heart and helps the heart beat normally.  There are different types of pacemakers. A pacemaker may be placed under the skin or muscle of your chest or abdomen.  Follow instructions from your health care provider about eating and drinking and about taking medicines before the procedure. This information is not intended to replace advice given to you by your health care provider. Make sure you discuss any questions you have with your health care provider. Document Revised: 07/12/2019 Document Reviewed: 07/12/2019 Elsevier Patient Education  2021 [employer].

## 2020-10-14 DIAGNOSIS — H6123 Impacted cerumen, bilateral: Secondary | ICD-10-CM | POA: Diagnosis not present

## 2020-10-21 ENCOUNTER — Ambulatory Visit: Payer: HMO | Admitting: Cardiovascular Disease

## 2020-10-22 ENCOUNTER — Ambulatory Visit (INDEPENDENT_AMBULATORY_CARE_PROVIDER_SITE_OTHER): Payer: HMO | Admitting: Internal Medicine

## 2020-10-22 ENCOUNTER — Encounter: Payer: Self-pay | Admitting: Internal Medicine

## 2020-10-22 ENCOUNTER — Other Ambulatory Visit: Payer: Self-pay

## 2020-10-22 VITALS — BP 162/68 | HR 59 | Temp 98.0°F | Resp 16 | Ht 69.0 in | Wt 171.1 lb

## 2020-10-22 DIAGNOSIS — Z0000 Encounter for general adult medical examination without abnormal findings: Secondary | ICD-10-CM | POA: Diagnosis not present

## 2020-10-22 DIAGNOSIS — E1142 Type 2 diabetes mellitus with diabetic polyneuropathy: Secondary | ICD-10-CM

## 2020-10-22 DIAGNOSIS — E785 Hyperlipidemia, unspecified: Secondary | ICD-10-CM | POA: Diagnosis not present

## 2020-10-22 DIAGNOSIS — E11319 Type 2 diabetes mellitus with unspecified diabetic retinopathy without macular edema: Secondary | ICD-10-CM

## 2020-10-22 DIAGNOSIS — I10 Essential (primary) hypertension: Secondary | ICD-10-CM

## 2020-10-22 NOTE — Patient Instructions (Signed)
Check the  blood pressure daily BP GOAL is between 110/65 and  135/85. If it is consistently higher or lower, let me know    GO TO THE LAB : Get the blood work     [place], [place] back for for a checkup in 4 to 5 months    Fall Prevention in the Home, Adult Falls can cause injuries and can affect people from all age groups. There are many simple things that you can do to make your home safe and to help prevent falls. Ask for help when making these changes, if needed. What actions can I take to prevent falls? General instructions  Use good lighting in all rooms. Replace any light bulbs that burn out.  Turn on lights if it is dark. Use night-lights.  Place frequently used items in easy-to-reach places. Lower the shelves around your home if necessary.  Set up furniture so that there are clear paths around it. Avoid moving your furniture around.  Remove throw rugs and other tripping hazards from the floor.  Avoid walking on wet floors.  Fix any uneven floor surfaces.  Add color or contrast paint or tape to grab bars and handrails in your home. Place contrasting color strips on the first and last steps of stairways.  When you use a stepladder, make sure that it is completely opened and that the sides are firmly locked. Have someone hold the ladder while you are using it. Do not climb a closed stepladder.  Be aware of any and all pets. What can I do in the bathroom?  Keep the floor dry. Immediately clean up any water that spills onto the floor.  Remove soap buildup in the tub or shower on a regular basis.  Use non-skid mats or decals on the floor of the tub or shower.  Attach bath mats securely with double-sided, non-slip rug tape.  If you need to sit down while you are in the shower, use a plastic, non-slip stool.  Install grab bars by the toilet and in the tub and shower. Do not use towel bars as grab bars.      What can I  do in the bedroom?  Make sure that a bedside light is easy to reach.  Do not use oversized bedding that drapes onto the floor.  Have a firm chair that has side arms to use for getting dressed. What can I do in the kitchen?  Clean up any spills right away.  If you need to reach for something above you, use a sturdy step stool that has a grab bar.  Keep electrical cables out of the way.  Do not use floor polish or wax that makes floors slippery. If you must use wax, make sure that it is non-skid floor wax. What can I do in the stairways?  Do not leave any items on the stairs.  Make sure that you have a light switch at the top of the stairs and the bottom of the stairs. Have them installed if you do not have them.  Make sure that there are handrails on both sides of the stairs. Fix handrails that are broken or loose. Make sure that handrails are as long as the stairways.  Install non-slip stair treads on all stairs in your home.  Avoid having throw rugs at the top or bottom of stairways, or secure the rugs with carpet tape to prevent them from moving.  Choose a carpet  design that does not hide the edge of steps on the stairway.  Check any carpeting to make sure that it is firmly attached to the stairs. Fix any carpet that is loose or worn. What can I do on the outside of my home?  Use bright outdoor lighting.  Regularly repair the edges of walkways and driveways and fix any cracks.  Remove high doorway thresholds.  Trim any shrubbery on the main path into your home.  Regularly check that handrails are securely fastened and in good repair. Both sides of any steps should have handrails.  Install guardrails along the edges of any raised decks or porches.  Clear walkways of debris and clutter, including tools and rocks.  Have leaves, snow, and ice cleared regularly.  Use sand or salt on walkways during winter months.  In the garage, clean up any spills right away,  including grease or oil spills. What other actions can I take?  Wear closed-toe shoes that fit well and support your feet. Wear shoes that have rubber soles or low heels.  Use mobility aids as needed, such as canes, walkers, scooters, and crutches.  Review your medicines with your health care provider. Some medicines can cause dizziness or changes in blood pressure, which increase your risk of falling. Talk with your health care provider about other ways that you can decrease your risk of falls. This may include working with a physical therapist or trainer to improve your strength, balance, and endurance. Where to find more information  Centers for Disease Control and Prevention, [place]: WebmailGuide.co.za  [employer] on Aging: BrainJudge.co.uk Contact a health care provider if:  You are afraid of falling at home.  You feel weak, drowsy, or dizzy at home.  You fall at home. Summary  There are many simple things that you can do to make your home safe and to help prevent falls.  Ways to make your home safe include removing tripping hazards and installing grab bars in the bathroom.  Ask for help when making these changes in your home. This information is not intended to replace advice given to you by your health care provider. Make sure you discuss any questions you have with your health care provider. Document Revised: 07/23/2017 Document Reviewed: 03/25/2017 Elsevier Patient Education  2021 [employer].

## 2020-10-22 NOTE — Progress Notes (Signed)
Pre visit review using our clinic review tool, if applicable. No additional management support is needed unless otherwise documented below in the visit note. 

## 2020-10-22 NOTE — Progress Notes (Signed)
Subjective:    Patient ID: Phillip Jordan, male    DOB: 1929/10/03, 87 y.o.   MRN: [id number]  DOS:  10/22/2020 Type of visit - description: CPX Since the last office visit, saw cardiology, notes reviewed. In general feeling well. Occasionally urine  is slightly slow but denies dysuria, gross hematuria.  Occasionally has nocturia. Symptom are not consistent.  BP Readings from Last 3 Encounters:  10/22/20 (!) 162/68  10/11/20 (!) 148/76  09/19/20 (!) 140/52     Review of Systems  Other than above, a 14 point review of systems is negative      Past Medical History:  Diagnosis Date  . Allergic rhinitis   . Aortic dissection ([place])    f/u by cards - AAA with distal dissection.  [name] CAD (coronary artery disease)    a. s/p MI 1992. b. Prior hx stenting to RCA/Cx. b. 05/2013: [place] s/p DES to distal RCA then staged DES to prox LAD.   [name] COPD with asthma ([place])   . Diabetes mellitus   . Hyperlipidemia   . Lichen planus    Right midline inferior chest, Dr. [name] Sensing  . SCC (squamous cell carcinoma)   . Shingles     Past Surgical History:  Procedure Laterality Date  . BACK SURGERY  1993  . CARDIAC CATHETERIZATION     left, w/ coronary angiography and left ventriculograpy  . CARDIOVERSION N/A 09/05/2020   Procedure: CARDIOVERSION;  Surgeon: [name], MD;  Location: [facility] ENDOSCOPY;  Service: Cardiovascular;  Laterality: N/A;  . LEFT HEART CATHETERIZATION WITH CORONARY ANGIOGRAM N/A 06/09/2013   Procedure: LEFT HEART CATHETERIZATION WITH CORONARY ANGIOGRAM;  Surgeon: [name], MD;  Location: [facility] CATH LAB;  Service: Cardiovascular;  Laterality: N/A;  . PERCUTANEOUS CORONARY STENT INTERVENTION (PCI-S) N/A 06/12/2013   Procedure: PERCUTANEOUS CORONARY STENT INTERVENTION (PCI-S);  Surgeon: [name], MD;  Location: [facility] CATH LAB;  Service: Cardiovascular;  Laterality: N/A;  prox LAD  . PTCA     w/ placement of drugeluting stent in the distal right coronary  artery, and distal Cardiologist: [name]    Allergies as of 10/22/2020   No Known Allergies     Medication List       Accurate as of October 22, 2020 11:59 PM. If you have any questions, ask your nurse or doctor.        STOP taking these medications   budesonide-formoterol 160-4.5 MCG/ACT inhaler Commonly known as: Symbicort Stopped by: Kathlene November, MD   gentamicin ointment 0.1 % Commonly known as: GARAMYCIN Stopped by: Kathlene November, MD     TAKE these medications   Acapella Misc Use as directed   albuterol 108 (90 Base) MCG/ACT inhaler Commonly known as: VENTOLIN HFA Inhale 2 puffs into the lungs every 4 (four) hours as needed for wheezing or shortness of breath.   apixaban 5 MG Tabs tablet Commonly known as: Eliquis Take 1 tablet (5 mg total) by mouth 2 (two) times daily.   aspirin EC 81 MG tablet Take 81 mg by mouth daily. Swallow whole.   azelastine 0.1 % nasal spray Commonly known as: ASTELIN Place 2 sprays into both nostrils at bedtime as needed for rhinitis. Use in each nostril as directed   fluticasone 50 MCG/ACT nasal spray Commonly known as: FLONASE Place 2 sprays into both nostrils daily as needed for allergies or rhinitis.   Fluticasone-Salmeterol 250-50 MCG/DOSE Aepb Commonly known as: ADVAIR Inhale 1 puff into the lungs 2 (two) times  daily.   FREESTYLE TEST STRIPS test strip Generic drug: glucose blood Check blood sugar no more than twice daily   isosorbide mononitrate 30 MG 24 hr tablet Commonly known as: IMDUR Take 1 tablet (30 mg total) by mouth 2 (two) times daily.   meclizine 12.5 MG tablet Commonly known as: ANTIVERT Take 1 tablet (12.5 mg total) 3 (three) times daily as needed by mouth for dizziness.   metFORMIN 500 MG tablet Commonly known as: GLUCOPHAGE Take 1 tablet (500 mg total) by mouth daily with breakfast.   metoprolol tartrate 25 MG tablet Commonly known as: LOPRESSOR Take 0.5 tablets (12.5 mg total) by mouth daily.    multivitamin with minerals Tabs tablet Take 1 tablet by mouth daily.   nitroGLYCERIN 0.4 MG SL tablet Commonly known as: NITROSTAT Place 1 tablet (0.4 mg total) under the tongue every 5 (five) minutes as needed for chest pain (up to 3 doses only.).   pravastatin 40 MG tablet Commonly known as: PRAVACHOL Take 1 tablet (40 mg total) by mouth daily. What changed: when to take this          Objective:   Physical Exam BP (!) 162/68 (BP Location: Left Arm, Patient Position: Sitting, Cuff Size: Small)   Pulse (!) 59   Temp 98 F (36.7 C) (Oral)   Resp 16   Ht 5\' 9"  (1.753 m)   Wt 171 lb 2 oz (77.6 kg)   SpO2 96%   BMI 25.27 kg/m  General: Well developed, NAD, BMI noted Neck: No  thyromegaly  HEENT:  Normocephalic . Face symmetric, atraumatic Lungs:  Decreased breath sounds Normal respiratory effort, no intercostal retractions, no accessory muscle use. Heart: Seems regular today Abdomen:  Not distended, soft, non-tender. No rebound or rigidity.  No bruit at Lower extremities: no pretibial edema bilaterally  Skin: At the plantar aspect, base of the right great toe he has a minute scab, 2 mm, otherwise no cellulitis, blisters or redness. Neurologic:  alert & oriented X3.  Speech normal, gait appropriate for age and unassisted Strength symmetric and appropriate for age.  Psych: Cognition and judgment appear intact.  Cooperative with normal attention span and concentration.  Behavior appropriate. No anxious or depressed appearing.     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 Ronnald Ramp Chronic hoarseness, status post ENT eval ~ 2012 Normal DEXA 2012 HOH H/o Shingles  PLAN: Here for CPX Diabetes, + retinopathy, +  neuropathy: Currently on Metformin, check a A1c, had a foot ulcer, currently doing pretty good, see physical exam. Neuropathy: Currently affecting both feet and hands, patient is somewhat disturbed by it, neurology referral?  Prefers to wait for now. HTN: Only on  Imdur, metoprolol, BP today slightly elevated, this morning at home 127/75 with a heart rate of 50s.  He did said that his BP at home varies over time.  No change for now, recommend to call if BP is elevated, see AVS Hyperlipidemia: On Pravachol, check FLP CAD, tachybradycardia syndrome, A. fib: Last visit with cardiology 10/11/2020 for symptomatic tachybradycardia syndrome, atrial fibrillation, was rx  PPM insertion and anticoag.   Seems to be tolerated anticoagulation, denies any unusual bleeding. RTC 4 months    In addition to CPX, we addressed all his chronic medical  problems.  This visit occurred during the SARS-CoV-2 public health emergency.  Safety protocols were in place, including screening questions prior to the visit, additional usage of staff PPE, and extensive cleaning of exam room while observing appropriate contact time as indicated for disinfecting solutions.

## 2020-10-23 ENCOUNTER — Encounter: Payer: Self-pay | Admitting: Internal Medicine

## 2020-10-23 ENCOUNTER — Other Ambulatory Visit: Payer: Self-pay | Admitting: Internal Medicine

## 2020-10-23 LAB — LIPID PANEL
Cholesterol: 157 mg/dL (ref 0–200)
HDL: 49 mg/dL (ref >39.00)
LDL Cholesterol: 77 mg/dL (ref 0–99)
NonHDL: 108.31
Total CHOL/HDL Ratio: 3
Triglycerides: 158 mg/dL — ABNORMAL HIGH (ref 0.0–149.0)
VLDL: 31.6 mg/dL (ref 0.0–40.0)

## 2020-10-23 LAB — ALT: ALT: 19 U/L (ref 0–53)

## 2020-10-23 LAB — HEMOGLOBIN A1C: Hgb A1c MFr Bld: 6.7 % — ABNORMAL HIGH (ref 4.6–6.5)

## 2020-10-23 LAB — AST: AST: 21 U/L (ref 0–37)

## 2020-10-23 NOTE — Assessment & Plan Note (Signed)
-  Td 2017 -pneumonia shot x 3 , last 05-2012; prevnar: 08-2015 -COVID VAX x3 -zostavax - 2009; s/p shingrix - CCS, prostate ca screening:  no further eval, see previous notes -Fall prevention discussed -AST, ALT, FLP, A1c. - advance directives on file

## 2020-10-23 NOTE — Assessment & Plan Note (Signed)
Diabetes, + retinopathy, + neuropathy: Currently on Metformin, check a A1c, had a foot ulcer, currently doing pretty good, see physical exam. Neuropathy: Currently affecting both feet and hands, patient is somewhat disturbed by it, neurology referral?  Prefers to wait for now. HTN: Only on  Imdur, metoprolol, BP today slightly elevated, this morning at home 127/75 with a heart rate of 50s.  He did said that his BP at home varies over time.  No change for now, recommend to call if BP is elevated, see AVS Hyperlipidemia: On Pravachol, check FLP CAD, tachybradycardia syndrome, A. fib: Last visit with cardiology 10/11/2020 for symptomatic tachybradycardia syndrome, atrial fibrillation, was rx  PPM insertion and anticoag.   Seems to be tolerated anticoagulation, denies any unusual bleeding. RTC 4 months

## 2020-10-30 ENCOUNTER — Other Ambulatory Visit: Payer: Self-pay

## 2020-10-30 ENCOUNTER — Ambulatory Visit: Payer: HMO | Admitting: Podiatry

## 2020-10-30 DIAGNOSIS — E0843 Diabetes mellitus due to underlying condition with diabetic autonomic (poly)neuropathy: Secondary | ICD-10-CM

## 2020-10-30 DIAGNOSIS — L97512 Non-pressure chronic ulcer of other part of right foot with fat layer exposed: Secondary | ICD-10-CM

## 2020-10-30 DIAGNOSIS — M79676 Pain in unspecified toe(s): Secondary | ICD-10-CM

## 2020-10-30 DIAGNOSIS — B351 Tinea unguium: Secondary | ICD-10-CM

## 2020-11-04 ENCOUNTER — Other Ambulatory Visit (HOSPITAL_COMMUNITY)
Admission: RE | Admit: 2020-11-04 | Discharge: 2020-11-04 | Disposition: A | Discharge status: Home / Self Care | Payer: HMO | Source: Ambulatory Visit | Attending: Internal Medicine | Admitting: Internal Medicine

## 2020-11-04 DIAGNOSIS — Z01812 Encounter for preprocedural laboratory examination: Secondary | ICD-10-CM | POA: Diagnosis not present

## 2020-11-04 DIAGNOSIS — Z20822 Contact with and (suspected) exposure to covid-19: Secondary | ICD-10-CM | POA: Insufficient documentation

## 2020-11-04 LAB — SARS CORONAVIRUS 2 (TAT 6-24 HRS): SARS Coronavirus 2: NEGATIVE

## 2020-11-05 NOTE — Pre-Procedure Instructions (Signed)
Attempted to call patient regarding procedure instructions for tomorrow.  Left voice mail on the following items: Arrival time 0830 Nothing to eat or drink after midnight No meds AM of procedure Responsible person to drive you home and stay with you for 24 hrs Wash with special soap night before and morning of procedure If on anti-coagulant drug instructions Eliquis- last dose 3/13

## 2020-11-06 ENCOUNTER — Ambulatory Visit (HOSPITAL_COMMUNITY)
Admission: RE | Admit: 2020-11-06 | Discharge: 2020-11-06 | Disposition: A | Discharge status: Home / Self Care | Payer: HMO | Attending: Internal Medicine | Admitting: Internal Medicine

## 2020-11-06 ENCOUNTER — Ambulatory Visit (HOSPITAL_COMMUNITY): Payer: HMO

## 2020-11-06 ENCOUNTER — Ambulatory Visit (HOSPITAL_COMMUNITY)
Admission: RE | Disposition: A | Discharge status: Home / Self Care | Payer: Self-pay | Source: Home / Self Care | Attending: Internal Medicine

## 2020-11-06 ENCOUNTER — Other Ambulatory Visit: Payer: Self-pay

## 2020-11-06 DIAGNOSIS — Z7984 Long term (current) use of oral hypoglycemic drugs: Secondary | ICD-10-CM | POA: Diagnosis not present

## 2020-11-06 DIAGNOSIS — I442 Atrioventricular block, complete: Secondary | ICD-10-CM | POA: Diagnosis not present

## 2020-11-06 DIAGNOSIS — I495 Sick sinus syndrome: Secondary | ICD-10-CM | POA: Diagnosis not present

## 2020-11-06 DIAGNOSIS — Z7901 Long term (current) use of anticoagulants: Secondary | ICD-10-CM | POA: Diagnosis not present

## 2020-11-06 DIAGNOSIS — Z8249 Family history of ischemic heart disease and other diseases of the circulatory system: Secondary | ICD-10-CM | POA: Insufficient documentation

## 2020-11-06 DIAGNOSIS — I4892 Unspecified atrial flutter: Secondary | ICD-10-CM | POA: Insufficient documentation

## 2020-11-06 DIAGNOSIS — J449 Chronic obstructive pulmonary disease, unspecified: Secondary | ICD-10-CM | POA: Diagnosis not present

## 2020-11-06 DIAGNOSIS — I10 Essential (primary) hypertension: Secondary | ICD-10-CM | POA: Insufficient documentation

## 2020-11-06 DIAGNOSIS — Z7951 Long term (current) use of inhaled steroids: Secondary | ICD-10-CM | POA: Diagnosis not present

## 2020-11-06 DIAGNOSIS — Z7982 Long term (current) use of aspirin: Secondary | ICD-10-CM | POA: Insufficient documentation

## 2020-11-06 DIAGNOSIS — Z79899 Other long term (current) drug therapy: Secondary | ICD-10-CM | POA: Insufficient documentation

## 2020-11-06 DIAGNOSIS — Z87891 Personal history of nicotine dependence: Secondary | ICD-10-CM | POA: Diagnosis not present

## 2020-11-06 DIAGNOSIS — Z950 Presence of cardiac pacemaker: Secondary | ICD-10-CM

## 2020-11-06 HISTORY — PX: PACEMAKER IMPLANT: EP1218

## 2020-11-06 LAB — GLUCOSE, CAPILLARY
Glucose-Capillary: 130 mg/dL — ABNORMAL HIGH (ref 70–99)
Glucose-Capillary: 113 mg/dL — ABNORMAL HIGH (ref 70–99)

## 2020-11-06 SURGERY — PACEMAKER IMPLANT

## 2020-11-06 MED ORDER — HEPARIN (PORCINE) IN NACL 1000-0.9 UT/500ML-% IV SOLN
INTRAVENOUS | Status: DC | PRN
  Administered 2020-11-06: 500 mL

## 2020-11-06 MED ORDER — LIDOCAINE HCL 1 % IJ SOLN
INTRAMUSCULAR | Status: AC
  Filled 2020-11-06: qty 60

## 2020-11-06 MED ORDER — FENTANYL CITRATE (PF) 100 MCG/2ML IJ SOLN
INTRAMUSCULAR | Status: AC
  Filled 2020-11-06: qty 2

## 2020-11-06 MED ORDER — MIDAZOLAM HCL 5 MG/5ML IJ SOLN
INTRAMUSCULAR | Status: DC | PRN
  Administered 2020-11-06 (×3): 1 mg via INTRAVENOUS

## 2020-11-06 MED ORDER — FENTANYL CITRATE (PF) 100 MCG/2ML IJ SOLN
INTRAMUSCULAR | Status: DC | PRN
  Administered 2020-11-06 (×3): 12.5 ug via INTRAVENOUS

## 2020-11-06 MED ORDER — CEFAZOLIN SODIUM-DEXTROSE 2-4 GM/100ML-% IV SOLN
2.0000 g | INTRAVENOUS | Status: AC
  Administered 2020-11-06: 2 g via INTRAVENOUS

## 2020-11-06 MED ORDER — CEFAZOLIN SODIUM-DEXTROSE 2-4 GM/100ML-% IV SOLN
INTRAVENOUS | Status: AC
  Filled 2020-11-06: qty 100

## 2020-11-06 MED ORDER — SODIUM CHLORIDE 0.9 % IV SOLN: INTRAVENOUS | Status: DC

## 2020-11-06 MED ORDER — SODIUM CHLORIDE 0.9 % IV SOLN
80.0000 mg | INTRAVENOUS | Status: AC
  Administered 2020-11-06: 80 mg

## 2020-11-06 MED ORDER — ACETAMINOPHEN 325 MG PO TABS: 325.0000 mg | ORAL_TABLET | ORAL | Status: DC | PRN

## 2020-11-06 MED ORDER — LIDOCAINE HCL (PF) 1 % IJ SOLN
INTRAMUSCULAR | Status: DC | PRN
  Administered 2020-11-06: 50 mL

## 2020-11-06 MED ORDER — SODIUM CHLORIDE 0.9 % IV SOLN
INTRAVENOUS | Status: AC
  Filled 2020-11-06: qty 2

## 2020-11-06 MED ORDER — HEPARIN (PORCINE) IN NACL 1000-0.9 UT/500ML-% IV SOLN
INTRAVENOUS | Status: AC
  Filled 2020-11-06: qty 500

## 2020-11-06 MED ORDER — NITROGLYCERIN 0.4 MG SL SUBL
0.4000 mg | SUBLINGUAL_TABLET | SUBLINGUAL | Status: DC | PRN
Start: 2020-11-06 — End: 2020-11-06

## 2020-11-06 MED ORDER — CEFAZOLIN SODIUM-DEXTROSE 1-4 GM/50ML-% IV SOLN
1.0000 g | Freq: Once | INTRAVENOUS | Status: AC
  Administered 2020-11-06: 1 g via INTRAVENOUS

## 2020-11-06 MED ORDER — CHLORHEXIDINE GLUCONATE 4 % EX LIQD
4.0000 "application " | Freq: Once | CUTANEOUS | Status: DC
  Filled 2020-11-06: qty 60

## 2020-11-06 MED ORDER — POVIDONE-IODINE 10 % EX SWAB
2.0000 "application " | Freq: Once | CUTANEOUS | Status: AC
  Administered 2020-11-06: 2 via TOPICAL

## 2020-11-06 MED ORDER — MIDAZOLAM HCL 5 MG/5ML IJ SOLN
INTRAMUSCULAR | Status: AC
  Filled 2020-11-06: qty 5

## 2020-11-06 MED ORDER — ONDANSETRON HCL 4 MG/2ML IJ SOLN: 4.0000 mg | Freq: Four times a day (QID) | INTRAMUSCULAR | Status: DC | PRN

## 2020-11-06 SURGICAL SUPPLY — 12 items

## 2020-11-06 NOTE — Progress Notes (Signed)
Dr Taylor notified of CXR results and ok to d/c home 

## 2020-11-06 NOTE — Interval H&P Note (Signed)
History and Physical Interval Note:  11/06/2020 9:31 AM  Phillip Jordan  has presented today for surgery, with the diagnosis of bradycardia.  The various methods of treatment have been discussed with the patient and family. After consideration of risks, benefits and other options for treatment, the patient has consented to  Procedure(s): PACEMAKER IMPLANT (N/A) as a surgical intervention.  The patient's history has been reviewed, patient examined, no change in status, stable for surgery.  I have reviewed the patient's chart and labs.  Questions were answered to the patient's satisfaction.     Cristopher Peru

## 2020-11-06 NOTE — Progress Notes (Addendum)
Client called me into room and c/o 5/10 left anterior chest pain; 12 lead EKG done and O2 started at 3l/min via nasal cannula; Renee,PA notified

## 2020-11-06 NOTE — Discharge Instructions (Signed)
Pacemaker Implantation, Adult, Care After This sheet gives you information about how to care for yourself after your procedure. Your health care provider may also give you more specific instructions. If you have problems or questions, contact your health care provider. What can I expect after the procedure? After the procedure, it is common to have:  Mild pain.  Slight bruising.  Some swelling over the incisions.  A slight bump over the skin where the device was placed (if it was implanted in the upper chest area). Sometimes, it is possible to feel the device under the skin. This is normal. Follow these instructions at home: Medicines  Take over-the-counter and prescription medicines only as told by your health care provider.  If you were prescribed an antibiotic medicine, take it as told by your health care provider. Do not stop taking the antibiotic even if you start to feel better.  Ask your health care provider if the medicine prescribed to you requires you to avoid driving or using machinery. Incision site care  Do not remove the bandage (dressing) on your chest until you are told to do so by your health care provider.  After your dressing is removed, you may see pieces of tape called skin adhesive strips over the incision site. Let the strips fall off on their own.  Check your incision area every day for signs of infection. Check for: ? More redness, swelling, or pain. ? Fluid or blood. ? Warmth. ? Pus or a bad smell.  Do not use lotions or ointments near the incision site unless you are told to do so.  Keep the incision area clean and dry for 2-3 days after the procedure or as told by your health care provider. It takes several weeks for the incision site to completely heal.  Women may want to place a small pad over the incision site to protect it from their bra strap.  Do not take baths, swim, or use a hot tub for 7-10 days or until your health care provider approves.  Ask your health care provider if you may take showers. You may only be allowed to take sponge baths.   Activity  If you were given a medicine to help you relax (sedative) during the procedure, it can affect you for several hours. Do not drive or operate machinery until your health care provider says that it is safe.  Return to your normal activities as told by your health care provider. Ask your health care provider what activities are safe for you.  Do not lift anything that is heavier than 10 lb (4.5 kg), or the limit that you are told, until your health care provider says that it is safe.  Avoid sudden jerking, pulling, or chopping movements that pull your upper arm far away from your body. Avoid these movements for at least 6 weeks or as long as told by your health care provider.  Do not lift your upper arm above your shoulders for at least 6 weeks or as long as told by your health care provider. This includes activities like tennis, golf, or swimming.  You may go back to work when your health care provider says it is okay.   Electricity and magnetic fields  Avoid places or objects that have a strong electric or magnetic field, including: ? Airport Data processing manager. When at the airport, let officials know that you have a pacemaker. Carry your pacemaker ID card. ? Metal detectors. If you must pass through a metal  detector, walk through it quickly. Do not stop under the detector or stand near it. ? Power plants. ? Large electrical generators. ? Radiofrequency transmission towers, such as mobile phone and radio towers.  Do not use amateur Chief of Staff. If you are unsure of whether something is safe to use, ask your health care provider. Some devices may be safe to use if you hold them at least 1 ft (0.3 m) from your pacemaker. These devices may include power tools, lawn mowers, and speakers.  When using your mobile phone, hold it to the ear opposite the  pacemaker. Do not leave your mobile phone in a pocket over the pacemaker. Long-term care  You may be shown how to transfer data from your pacemaker through the phone to your health care provider.  Always let all health care providers, including dentists, know about your pacemaker before you have any medical procedures or tests.  Wear a medical ID bracelet or necklace stating that you have a pacemaker.  Carry a pacemaker ID card with you at all times.  Your pacemaker battery will last for 5-15 years. Your health care provider will do routine checks to know when the battery is starting to run down. When this happens, the pacemaker will need to be replaced. General instructions  Do not use any products that contain nicotine or tobacco, such as cigarettes, e-cigarettes, and chewing tobacco. These can delay incision healing after surgery. If you need help quitting, ask your health care provider.  Follow instructions from your health care provider about eating or drinking restrictions.  Weigh yourself every day. If you suddenly gain weight, fluid may be building up in your body.  Keep all follow-up visits as told by your health care provider. This is important. During follow-up visits, your pacemaker will be checked and reprogrammed if necessary. Contact a health care provider if:  You gain weight suddenly.  Your legs or feet swell.  It feels like your heart is fluttering or skipping beats (you have heart palpitations).  You have any of these signs of infection: ? More redness, swelling, or pain around an incision. ? Fluid or blood coming from an incision. ? Warmth coming from an incision. ? Pus or a bad smell coming from an incision. ? A fever or chills. Get help right away if:  You have chest pain.  You have trouble breathing or are short of breath.  You become extremely tired.  You are light-headed or you faint. These symptoms may represent a serious problem that is an  emergency. Do not wait to see if the symptoms will go away. Get medical help right away. Call your local emergency services (911 in the [place] Do not drive yourself to the hospital. Summary  After the procedure, it is common to have mild pain, swelling, or bruising over the incision.  Take over-the-counter and prescription medicines only as told by your health care provider.  Do not raise your arm above your shoulder or lift anything that is heavier than 10 lb (4.5 kg), or the limit that you are told, until your health care provider says that it is safe.  Carry a pacemaker ID card with you at all times. This information is not intended to replace advice given to you by your health care provider. Make sure you discuss any questions you have with your health care provider. Document Revised: 07/12/2019 Document Reviewed: 07/12/2019 Elsevier Patient Education  2021 [facility] Discharge Instructions  for  Pacemaker/Defibrillator Patients  Tomorrow, 11/07/20, send in a device transmission  Activity No heavy lifting or vigorous activity with your left/right arm for 6 to 8 weeks.  Do not raise your left/right arm above your head for one week.  Gradually raise your affected arm as drawn below.              11/11/20                    11/13/19                    11/13/20                   11/14/20 __  NO DRIVING until cleared to at your wound check visit  WOUND CARE - Keep the wound area clean and dry.  Do not get this area wet , no showers for 1 week, you may shower 11/14/20 - Tomorrow, 11/07/20, remove the arm sling - Tomorrow, 11/07/20 remove the large outer plastic bandage.  Underneath the plastic bandage there are steri strips (paper tapes), DO NOT remove these. - The tape/steri-strips on your wound will fall off; do not pull them off.  No bandage is needed on the site.  DO  NOT apply any creams, oils, or ointments to the wound area. - If you notice any drainage or discharge  from the wound, any swelling or bruising at the site, or you develop a fever > 101? F after you are discharged home, call the office at once.  Special Instructions - You are still able to use cellular telephones; use the ear opposite the side where you have your pacemaker/defibrillator.  Avoid carrying your cellular phone near your device. - When traveling through airports, show security personnel your identification card to avoid being screened in the metal detectors.  Ask the security personnel to use the hand wand. - Avoid arc welding equipment, MRI testing (magnetic resonance imaging), TENS units (transcutaneous nerve stimulators).  Call the office for questions about other devices. - Avoid electrical appliances that are in poor condition or are not properly grounded. - Microwave ovens are safe to be near or to operate.

## 2020-11-06 NOTE — Progress Notes (Signed)
Renee,PA in to see client and client states pain has decreased to 1/10

## 2020-11-07 ENCOUNTER — Encounter (HOSPITAL_COMMUNITY): Payer: Self-pay | Admitting: Internal Medicine

## 2020-11-07 MED FILL — Lidocaine HCl Local Inj 1%: INTRAMUSCULAR | Qty: 50 | Status: AC

## 2020-11-11 ENCOUNTER — Other Ambulatory Visit: Payer: Self-pay | Admitting: Internal Medicine

## 2020-11-12 NOTE — Progress Notes (Signed)
   Subjective:  87 y.o. male  presenting today for follow-up evaluation of an ulcer to the plantar aspect of the right first MTPJ.  Patient has a very high arch foot that causes a lot of pressure to the area.  Patient states that he has no pain and no new concerns at this time.  He has been applying the antibiotic cream with some modest improvement.  Today the patient is requesting a nail trim.  He states that his nails are very tender and sensitive to palpation.  Especially in close toed shoes.  He presents for further treatment evaluation Past Medical History:  Diagnosis Date  . Allergic rhinitis   . Aortic dissection ([place])    f/u by cards - AAA with distal dissection.  [name] CAD (coronary artery disease)    a. s/p MI 1992. b. Prior hx stenting to RCA/Cx. b. 05/2013: [place] s/p DES to distal RCA then staged DES to prox LAD.   [name] COPD with asthma ([place])   . Diabetes mellitus   . Hyperlipidemia   . Lichen planus    Right midline inferior chest, Dr. [name] Sensing  . SCC (squamous cell carcinoma)   . Shingles       Objective/Physical Exam General: The patient is alert and oriented x3 in no acute distress.  Dermatology:  Wound #1 noted to the 0.2x0.2 x 0.1 cm (LxWxD).  Slightly improved since last visit  To the noted ulceration(s), there is no eschar. There is a moderate amount of slough, fibrin, and necrotic tissue noted. Granulation tissue and wound base is red. There is a minimal amount of serosanguineous drainage noted. There is no exposed bone muscle-tendon ligament or joint. There is no malodor. Periwound integrity is intact.  Hyperkeratotic elongated dystrophic discolored nails noted 1-5 bilateral with associated tenderness to palpation. Skin is warm, dry and supple bilateral lower extremities.  Vascular: Palpable pedal pulses bilaterally. No edema or erythema noted. Capillary refill within normal limits.  Neurological: Epicritic and protective threshold diminished bilaterally.    Musculoskeletal Exam: Range of motion within normal limits to all pedal and ankle joints bilateral. Muscle strength 5/5 in all groups bilateral. Plantarflexed first ray.   Assessment: 1.  Ulcer subfirst MTPJ right foot secondary to diabetes mellitus 2.  Pain due to onychomycosis of toenail bilateral  Plan of Care:  1. Patient was evaluated.   2. medically necessary excisional debridement including subcutaneous tissue was performed using a tissue nipper and a chisel blade. Excisional debridement of all the necrotic nonviable tissue down to healthy bleeding viable tissue was performed with post-debridement measurements same as pre-. 3. the wound was cleansed and dry sterile dressing applied. 4.  Continue gentamicin cream daily 5.  Continue offloading dancers pad to the insoles of the shoes 6.  Mechanical debridement of nails 1-5 bilateral was performed using a nail nipper without incident or bleeding  7.  Patient is to return to clinic in 8 weeks   [name], DPM [facility]  Dr. [name], DPM    [address], [place] [address]                Office [phone]  Fax [phone]

## 2020-11-15 NOTE — Progress Notes (Deleted)
Chronic Care Management Pharmacy Note  11/15/2020 Name:  Phillip Jordan MRN:  [id number] DOB:  04-21-1930  Subjective: Phillip Jordan is an 87 y.o. year old male who is a primary patient of [name], [name], MD.  The CCM team was consulted for assistance with disease management and care coordination needs.    Engaged with patient by telephone for follow up visit in response to provider referral for pharmacy case management and/or care coordination services.   Consent to Services:  The patient was given information about Chronic Care Management services, agreed to services, and gave verbal consent prior to initiation of services.  Please see initial visit note for detailed documentation.   Patient Care Team: Colon Branch, MD as PCP - [name], MD as Consulting Physician (Dermatology) [name], MD as Consulting Physician (Ophthalmology) [name], MD as Consulting Physician (Pulmonary Disease) [name], MD as Consulting Physician (Cardiology) [name], DPM as Consulting Physician (Podiatry) [name], [name], [name] (Inactive) as Pharmacist (Pharmacist)  Recent office visits: 10/22/2020 - PCP (Dr [name]) F/U chronic conditions; DM with neuropathy, HTN, hyperlipidemia, CAD, Afib. No Medicaiton changes. Lab ordered - A1c, lipids, CMP.  BP noted to be elevated at 162/68 but patient reported this morning at home 127/75 with a heart rate of 50s.  He did said that his BP at home varies over time.  No change for now, recommend to call if BP is elevated.   Recent consult visits: 10/30/2020 - Podiatrist (Dr Daylene Katayama). F/U of an ulcer to the plantar aspect of the right first MTPJ. Pt also c/o pain due to onychomycosis of toenail bilateral.  Plan: excisional debridement including subcutaneous tissue was performed. Excisional debridement of all the necrotic nonviable tissue down to healthy bleeding viable tissue was performed. Continue gentamicin cream daily. Continue  offloading dancers pad to the insoles of the shoes. Performed mechanical debridement of nails 1-5 bilateral. Return to clinic in 8 weeks  10/11/2020 - Cardio (Dr. [name]) Seen for tachy-brady syndrome. Plan: PPM insertion to be scheduled  Hospital visits: Pacemaker placement 11/06/2020 - no medication changes noted except continue to hold Eliquis until 11/10/2020.  Objective:  Lab Results  Component Value Date   CREATININE 0.92 10/11/2020   CREATININE 0.97 08/29/2020   CREATININE 1.09 06/24/2020    Lab Results  Component Value Date   HGBA1C 6.7 (H) 10/22/2020   Last diabetic Eye exam:  Lab Results  Component Value Date/Time   HMDIABEYEEXA Retinopathy (A) 06/13/2020 12:00 AM    Last diabetic Foot exam:  Lab Results  Component Value Date/Time   HMDIABFOOTEX Done/Abnormal 05/02/2015 12:00 AM        Component Value Date/Time   CHOL 157 10/22/2020 1521   TRIG 158.0 (H) 10/22/2020 1521   TRIG 181 09/06/2009 1602   HDL 49.00 10/22/2020 1521   CHOLHDL 3 10/22/2020 1521   VLDL 31.6 10/22/2020 1521   LDLCALC 77 10/22/2020 1521    Hepatic Function Latest Ref Rng & Units 10/22/2020 12/13/2019 08/09/2019  Total Protein 6.0 - 8.3 g/dL - 6.8 6.8  Albumin 3.5 - 5.2 g/dL - 4.3 4.4  AST 0 - 37 U/L _0 ALT 0 - 53 U/L _1 Alk Phosphatase 39 - 117 U/L - 90 90  Total Bilirubin 0.2 - 1.2 mg/dL - 0.4 0.4  Bilirubin, Direct 0.0 - 0.3 mg/dL - - -    Lab Results  Component Value Date/Time   TSH 1.96 06/19/2020  01:43 PM   TSH 1.87 02/01/2017 11:40 AM    CBC Latest Ref Rng & Units 10/11/2020 08/29/2020 06/19/2020  WBC 3.4 - 10.8 x10E3/uL 10.3 11.6(H) 12.0(H)  Hemoglobin 13.0 - 17.7 g/dL 15.2 15.9 15.0  Hematocrit 37.5 - 51.0 % 44.1 46.5 43.8  Platelets 150 - 450 x10E3/uL 282 278 292    Lab Results  Component Value Date/Time   VD25OH 44.39 12/12/2015 04:15 PM    Clinical ASCVD: {YES/NO:21197} The ASCVD Risk score [name] DC Jr., et al., 2013) failed to calculate for the  following reasons:   The 2013 ASCVD risk score is only valid for ages 74 to 54    Other: (CHADS2VASc if Afib, PHQ9 if depression, MMRC or CAT for COPD, ACT, DEXA)  Social History   Tobacco Use  Smoking Status Former Smoker  . Packs/day: 1.50  . Years: 39.00  . Pack years: 58.50  . Types: Cigarettes  . Quit date: 08/24/1992  . Years since quitting: 28.2  Smokeless Tobacco Never Used   BP Readings from Last 3 Encounters:  11/06/20 (!) 145/64  10/22/20 (!) 162/68  10/11/20 (!) 148/76   Pulse Readings from Last 3 Encounters:  11/06/20 (!) 59  10/22/20 (!) 59  10/11/20 86   Wt Readings from Last 3 Encounters:  11/06/20 172 lb (78 kg)  10/22/20 171 lb 2 oz (77.6 kg)  10/11/20 173 lb (78.5 kg)    Assessment: Review of patient past medical history, allergies, medications, health status, including review of consultants reports, laboratory and other test data, was performed as part of comprehensive evaluation and provision of chronic care management services.   SDOH:  (Social Determinants of Health) assessments and interventions performed:    CCM Care Plan  No Known Allergies  Medications Reviewed Today    Reviewed by Doneen Poisson, RN (Registered Nurse) on 11/06/20 at [place] List Status: Complete  Medication Order Taking? Sig Documenting Provider Last Dose Status Informant  albuterol (VENTOLIN HFA) 108 (90 Base) MCG/ACT inhaler [id number] Yes Inhale 2 puffs into the lungs every 4 (four) hours as needed for wheezing or shortness of breath. [name], MD Past Month Unknown time Active Self  apixaban (ELIQUIS) 5 MG TABS tablet [id number] Yes Take 1 tablet (5 mg total) by mouth 2 (two) times daily. [name], MD 11/02/2020 1900 Active Self  aspirin EC 81 MG tablet [id number] Yes Take 81 mg by mouth daily. Swallow whole. [provider] 11/05/2020 Unknown time Active Self  azelastine (ASTELIN) 0.1 % nasal spray [id number] No Place 2 sprays into both nostrils at  bedtime as needed for rhinitis. Use in each nostril as directed Colon Branch, MD More than a month Unknown time Active Self  fluticasone (FLONASE) 50 MCG/ACT nasal spray [id number] Yes Place 2 sprays into both nostrils daily as needed for allergies or rhinitis. [provider] Past Week Unknown time Active Self  Fluticasone-Salmeterol (ADVAIR) 250-50 MCG/DOSE AEPB [id number] Yes Inhale 1 puff into the lungs 2 (two) times daily. [provider]  Active Self  glucose blood (FREESTYLE TEST STRIPS) test strip [id number]  Check blood sugar no more than twice daily Colon Branch, MD  Active Self  isosorbide mononitrate (IMDUR) 30 MG 24 hr tablet [id number] Yes Take 1 tablet (30 mg total) by mouth 2 (two) times daily. Colon Branch, MD 11/06/2020 0600 Active Self  meclizine (ANTIVERT) 12.5 MG tablet [id number] No Take 1 tablet (12.5 mg total) 3 (three) times daily as needed by  mouth for dizziness. Colon Branch, MD More than a month Unknown time Active Self           Med Note Lia Hopping, MINDY L   Mon Jul 29, 2020 10:17 AM)    metFORMIN (GLUCOPHAGE) 500 MG tablet [id number] Yes Take 1 tablet (500 mg total) by mouth daily with breakfast. Colon Branch, MD 11/05/2020 Unknown time Active Self  metoprolol tartrate (LOPRESSOR) 25 MG tablet [id number] Yes Take 0.5 tablets (12.5 mg total) by mouth daily. [name], MD 11/05/2020 Unknown time Active Self  Misc. Devices [facility]) Parker Strip [id number]  Use as directed [name], MD  Active Self  Multiple Vitamin (MULTIVITAMIN WITH MINERALS) TABS tablet [id number] Yes Take 1 tablet by mouth daily. [provider] 11/06/2020 0600 Active Self  nitroGLYCERIN (NITROSTAT) 0.4 MG SL tablet [id number] Yes Place 1 tablet (0.4 mg total) under the tongue every 5 (five) minutes as needed for chest pain (up to 3 doses only.). [name], MD  Active Self           Med Note Lia Hopping, Toniann Fail   Mon Jul 29, 2020 10:17 AM)    pravastatin (PRAVACHOL) 40 MG tablet [id number] Yes  Take 1 tablet (40 mg total) by mouth daily.  Patient taking differently: Take 40 mg by mouth at bedtime.   Colon Branch, MD 11/05/2020 Unknown time Active           Patient Active Problem List   Diagnosis Date Noted  . Tachycardia-bradycardia syndrome ([place]) 10/11/2020  . SCC (squamous cell carcinoma) 10/16/2019  . Diabetic neuropathy ([place]) 05/23/2019  . Hypertension, essential 09/01/2018  . COPD exacerbation ([place]) 07/13/2016  . Sinusitis, chronic 11/08/2015  . Follow-up --------------PCP NOTES 05/02/2015  . Vertigo 06/26/2014  . Palpitations 08/29/2013  . Hoarseness of voice 06/17/2011  . Annual physical exam 05/26/2011  . BUNDLE BRANCH BLOCK, RIGHT 01/10/2009  . Allergic rhinitis 08/16/2007  . Type 2 diabetes, controlled, with retinopathy ([place]) 06/30/2007  . Hyperlipidemia 06/30/2007  . CAD (coronary artery disease) 06/30/2007  . Dissection of aorta ([place]) 06/30/2007  . COPD GOLD III 06/30/2007    Immunization History  Administered Date(s) Administered  . Fluad Quad(high Dose 65+) 05/11/2019  . Influenza Split 05/26/2011, 06/01/2012  . Influenza Whole 06/30/2007, 06/18/2008, 06/14/2009, 04/24/2010  . Influenza, High Dose Seasonal PF 06/19/2014, 05/02/2015, 06/08/2017, 05/16/2018  . Influenza,inj,Quad PF,6+ Mos 06/10/2013  . Influenza-Unspecified 05/21/2016, 05/16/2020  . Moderna SARS-COV2 Booster Vaccination 06/24/2020  . Moderna Sars-Covid-2 Vaccination 09/05/2019, 10/03/2019  . Pneumococcal Conjugate-13 09/10/2015  . Pneumococcal Polysaccharide-23 08/24/2001, 06/30/2007, 06/01/2012  . Td 08/24/2005, 07/06/2016  . Tdap 09/12/2005  . Zoster 10/13/2007  . Zoster Recombinat (Shingrix) 09/01/2018, 11/01/2018    Conditions to be addressed/monitored: {CCM ASSESSMENT DISEASE OPTIONS:25047}  There are no care plans that you recently modified to display for this patient.   Medication Assistance: {MEDASSISTANCEINFO:25044}  Patient's preferred pharmacy is:  [facility], [address] [place] [address] Phone: [phone] Fax: [phone]  Uses pill box? {Yes or If no, why not?:20788} Pt endorses ***% compliance  Follow Up:  {FOLLOWUP:24991}  Plan: {CM FOLLOW UP PLAN:25073}  SIG***

## 2020-11-18 ENCOUNTER — Ambulatory Visit (INDEPENDENT_AMBULATORY_CARE_PROVIDER_SITE_OTHER): Payer: HMO | Admitting: Pharmacist

## 2020-11-18 DIAGNOSIS — E785 Hyperlipidemia, unspecified: Secondary | ICD-10-CM | POA: Diagnosis not present

## 2020-11-18 DIAGNOSIS — J449 Chronic obstructive pulmonary disease, unspecified: Secondary | ICD-10-CM

## 2020-11-18 DIAGNOSIS — E11319 Type 2 diabetes mellitus with unspecified diabetic retinopathy without macular edema: Secondary | ICD-10-CM | POA: Diagnosis not present

## 2020-11-18 DIAGNOSIS — I495 Sick sinus syndrome: Secondary | ICD-10-CM

## 2020-11-18 DIAGNOSIS — I2510 Atherosclerotic heart disease of native coronary artery without angina pectoris: Secondary | ICD-10-CM | POA: Diagnosis not present

## 2020-11-18 NOTE — Chronic Care Management (AMB) (Signed)
Chronic Care Management Pharmacy Note  11/15/2020 Name:  Phillip Jordan MRN:  [id number] DOB:  1930-06-20  Subjective: Phillip Jordan is an 87 y.o. year old male who is a primary patient of [name], [name], MD.  The CCM team was consulted for assistance with disease management and care coordination needs.    Engaged with patient by telephone for follow up visit in response to provider referral for pharmacy case management and/or care coordination services.   Consent to Services:  The patient was given information about Chronic Care Management services, agreed to services, and gave verbal consent prior to initiation of services.  Please see initial visit note for detailed documentation.   Patient Care Team: Colon Branch, MD as PCP - [name], MD as Consulting Physician (Dermatology) [name], MD as Consulting Physician (Ophthalmology) [name], MD as Consulting Physician (Pulmonary Disease) [name], MD as Consulting Physician (Cardiology) [name], DPM as Consulting Physician (Podiatry) Day, [name], [facility] (Inactive) as Pharmacist (Pharmacist)  Recent office visits: 10/22/2020 - PCP (Dr [name]) F/U chronic conditions; DM with neuropathy, HTN, hyperlipidemia, CAD, Afib. No Medicaiton changes. Lab ordered - A1c, lipids, CMP.  BP noted to be elevated at 162/68 but patient reported this morning at home 127/75 with a heart rate of 50s.  He did said that his BP at home varies over time.  No change for now, recommend to call if BP is elevated.   Recent consult visits: 10/30/2020 - Podiatrist (Dr Daylene Katayama). F/U of an ulcer to the plantar aspect of the right first MTPJ. Pt also c/o pain due to onychomycosis of toenail bilateral.  Plan: excisional debridement including subcutaneous tissue was performed. Excisional debridement of all the necrotic nonviable tissue down to healthy bleeding viable tissue was performed. Continue gentamicin cream daily. Continue  offloading dancers pad to the insoles of the shoes. Performed mechanical debridement of nails 1-5 bilateral. Return to clinic in 8 weeks  10/11/2020 - Cardio (Dr. [name]) Seen for tachy-brady syndrome. Plan: PPM insertion to be scheduled  Hospital visits: Pacemaker placement 11/06/2020 - no medication changes noted except continue to hold Eliquis until 11/10/2020.  Objective:  Lab Results  Component Value Date   CREATININE 0.92 10/11/2020   CREATININE 0.97 08/29/2020   CREATININE 1.09 06/24/2020    Lab Results  Component Value Date   HGBA1C 6.7 (H) 10/22/2020   Last diabetic Eye exam:  Lab Results  Component Value Date/Time   HMDIABEYEEXA Retinopathy (A) 06/13/2020 12:00 AM    Last diabetic Foot exam:  Saw podiatrist 10/30/2020 - see above; abnormal foot exam with ulcer noted. Continues to follow up with podiatry for treatment of ulcer and nail care.     Component Value Date/Time   CHOL 157 10/22/2020 1521   TRIG 158.0 (H) 10/22/2020 1521   TRIG 181 09/06/2009 1602   HDL 49.00 10/22/2020 1521   CHOLHDL 3 10/22/2020 1521   VLDL 31.6 10/22/2020 1521   LDLCALC 77 10/22/2020 1521    Hepatic Function Latest Ref Rng & Units 10/22/2020 12/13/2019 08/09/2019  Total Protein 6.0 - 8.3 g/dL - 6.8 6.8  Albumin 3.5 - 5.2 g/dL - 4.3 4.4  AST 0 - 37 U/L '21 15 15  ' ALT 0 - 53 U/L '19 12 16  ' Alk Phosphatase 39 - 117 U/L - 90 90  Total Bilirubin 0.2 - 1.2 mg/dL - 0.4 0.4  Bilirubin, Direct 0.0 - 0.3 mg/dL - - -    Lab Results  Component Value Date/Time   TSH 1.96 06/19/2020 01:43 PM   TSH 1.87 02/01/2017 11:40 AM    CBC Latest Ref Rng & Units 10/11/2020 08/29/2020 06/19/2020  WBC 3.4 - 10.8 x10E3/uL 10.3 11.6(H) 12.0(H)  Hemoglobin 13.0 - 17.7 g/dL 15.2 15.9 15.0  Hematocrit 37.5 - 51.0 % 44.1 46.5 43.8  Platelets 150 - 450 x10E3/uL 282 278 292    Lab Results  Component Value Date/Time   VD25OH 44.39 12/12/2015 04:15 PM    Clinical ASCVD: Yes  The ASCVD Risk score [name] DC Jr.,  et al., 2013) failed to calculate for the following reasons:   The 2013 ASCVD risk score is only valid for ages 28 to 46    Other: CHADS-VASc = 5  Social History   Tobacco Use  Smoking Status Former Smoker  . Packs/day: 1.50  . Years: 39.00  . Pack years: 58.50  . Types: Cigarettes  . Quit date: 08/24/1992  . Years since quitting: 28.2  Smokeless Tobacco Never Used   BP Readings from Last 3 Encounters:  11/06/20 (!) 145/64  10/22/20 (!) 162/68  10/11/20 (!) 148/76   Pulse Readings from Last 3 Encounters:  11/06/20 (!) 59  10/22/20 (!) 59  10/11/20 86   Wt Readings from Last 3 Encounters:  11/06/20 172 lb (78 kg)  10/22/20 171 lb 2 oz (77.6 kg)  10/11/20 173 lb (78.5 kg)    Assessment: Review of patient past medical history, allergies, medications, health status, including review of consultants reports, laboratory and other test data, was performed as part of comprehensive evaluation and provision of chronic care management services.   SDOH:  (Social Determinants of Health) assessments and interventions performed:    CCM Care Plan  No Known Allergies  Medications Reviewed Today    Reviewed by Doneen Poisson, RN (Registered Nurse) on 11/06/20 at [place] List Status: Complete  Medication Order Taking? Sig Documenting Provider Last Dose Status Informant  albuterol (VENTOLIN HFA) 108 (90 Base) MCG/ACT inhaler [id number] Yes Inhale 2 puffs into the lungs every 4 (four) hours as needed for wheezing or shortness of breath. [name], MD Past Month Unknown time Active Self  apixaban (ELIQUIS) 5 MG TABS tablet [id number] Yes Take 1 tablet (5 mg total) by mouth 2 (two) times daily. [name], MD 11/02/2020 1900 Active Self  aspirin EC 81 MG tablet [id number] Yes Take 81 mg by mouth daily. Swallow whole. [provider] 11/05/2020 Unknown time Active Self  azelastine (ASTELIN) 0.1 % nasal spray [id number] No Place 2 sprays into both nostrils at bedtime as needed  for rhinitis. Use in each nostril as directed Colon Branch, MD More than a month Unknown time Active Self  fluticasone (FLONASE) 50 MCG/ACT nasal spray [id number] Yes Place 2 sprays into both nostrils daily as needed for allergies or rhinitis. [provider] Past Week Unknown time Active Self  Fluticasone-Salmeterol (ADVAIR) 250-50 MCG/DOSE AEPB [id number] Yes Inhale 1 puff into the lungs 2 (two) times daily. [provider]  Active Self  glucose blood (FREESTYLE TEST STRIPS) test strip [id number]  Check blood sugar no more than twice daily Colon Branch, MD  Active Self  isosorbide mononitrate (IMDUR) 30 MG 24 hr tablet [id number] Yes Take 1 tablet (30 mg total) by mouth 2 (two) times daily. Colon Branch, MD 11/06/2020 0600 Active Self  meclizine (ANTIVERT) 12.5 MG tablet [id number] No Take 1 tablet (12.5 mg total) 3 (three) times daily as needed by mouth  for dizziness. Colon Branch, MD More than a month Unknown time Active Self           Med Note Lia Hopping, MINDY L   Mon Jul 29, 2020 10:17 AM)    metFORMIN (GLUCOPHAGE) 500 MG tablet [id number] Yes Take 1 tablet (500 mg total) by mouth daily with breakfast. Colon Branch, MD 11/05/2020 Unknown time Active Self  metoprolol tartrate (LOPRESSOR) 25 MG tablet [id number] Yes Take 0.5 tablets (12.5 mg total) by mouth daily. [name], MD 11/05/2020 Unknown time Active Self  Misc. Devices [facility]) [place] [id number]  Use as directed [name], MD  Active Self  Multiple Vitamin (MULTIVITAMIN WITH MINERALS) TABS tablet [id number] Yes Take 1 tablet by mouth daily. [provider] 11/06/2020 0600 Active Self  nitroGLYCERIN (NITROSTAT) 0.4 MG SL tablet [id number] Yes Place 1 tablet (0.4 mg total) under the tongue every 5 (five) minutes as needed for chest pain (up to 3 doses only.). [name], MD  Active Self           Med Note Lia Hopping, Toniann Fail   Mon Jul 29, 2020 10:17 AM)    pravastatin (PRAVACHOL) 40 MG tablet [id number] Yes Take 1 tablet (40  mg total) by mouth daily.  Patient taking differently: Take 40 mg by mouth at bedtime.   Colon Branch, MD 11/05/2020 Unknown time Active           Patient Active Problem List   Diagnosis Date Noted  . Tachycardia-bradycardia syndrome ([place]) 10/11/2020  . SCC (squamous cell carcinoma) 10/16/2019  . Diabetic neuropathy ([place]) 05/23/2019  . Hypertension, essential 09/01/2018  . COPD exacerbation ([place]) 07/13/2016  . Sinusitis, chronic 11/08/2015  . Follow-up --------------PCP NOTES 05/02/2015  . Vertigo 06/26/2014  . Palpitations 08/29/2013  . Hoarseness of voice 06/17/2011  . Annual physical exam 05/26/2011  . BUNDLE BRANCH BLOCK, RIGHT 01/10/2009  . Allergic rhinitis 08/16/2007  . Type 2 diabetes, controlled, with retinopathy ([place]) 06/30/2007  . Hyperlipidemia 06/30/2007  . CAD (coronary artery disease) 06/30/2007  . Dissection of aorta ([place]) 06/30/2007  . COPD GOLD III 06/30/2007    Immunization History  Administered Date(s) Administered  . Fluad Quad(high Dose 65+) 05/11/2019  . Influenza Split 05/26/2011, 06/01/2012  . Influenza Whole 06/30/2007, 06/18/2008, 06/14/2009, 04/24/2010  . Influenza, High Dose Seasonal PF 06/19/2014, 05/02/2015, 06/08/2017, 05/16/2018  . Influenza,inj,Quad PF,6+ Mos 06/10/2013  . Influenza-Unspecified 05/21/2016, 05/16/2020  . Moderna SARS-COV2 Booster Vaccination 06/24/2020  . Moderna Sars-Covid-2 Vaccination 09/05/2019, 10/03/2019  . Pneumococcal Conjugate-13 09/10/2015  . Pneumococcal Polysaccharide-23 08/24/2001, 06/30/2007, 06/01/2012  . Td 08/24/2005, 07/06/2016  . Tdap 09/12/2005  . Zoster 10/13/2007  . Zoster Recombinat (Shingrix) 09/01/2018, 11/01/2018    Conditions to be addressed/monitored: Atrial Fibrillation, CAD, HTN, HLD, COPD and DMII  Goals Addressed            This Visit's Progress   . A1c goal <7%   On track    -Fasting (before first meal of day) blood sugar goal is 80-130 -Post-prandial (2 hrs after meal) blood  sugar is less than 180    . Check blood sugar 2 to 3 times per week   On track   . Chronic Care Management Pharmacy Care Plan   On track    CARE PLAN ENTRY (see longitudinal plan of care for additional care plan information)  Current Barriers:  . Chronic Disease Management support, education, and care coordination needs related to COPD, DM, HTN, HLD, CAD, Vertigo  Hypertension BP  Readings from Last 3 Encounters:  11/06/20 (!) 145/64  10/22/20 (!) 162/68  10/11/20 (!) 148/76   . Pharmacist Clinical Goal(s): o Over the next 180 days, patient will work with PharmD and providers to Schieve BP goal <130/80 . Current regimen:  . Metoprolol tartrate 50m = take 0.5 tablet (= 12.554m daily . Interventions: o Discussed BP goals  . Patient self care activities - Over the next 180 days, patient will: o Check BP 1 to 2 times per week. Record and bring to next office appointment.  o Ensure daily salt intake < 2300 mg/day o Consider change to metoprolol succinate 2545m.5 tablet daily if future  Hyperlipidemia/CAD Lab Results  Component Value Date/Time   LDLCALC 77 10/22/2020 03:21 PM   . Pharmacist Clinical Goal(s): o Over the next 180 days, patient will work with PharmD and providers to achieve LDL goal < 70 . Current regimen:  . Pravastatin 65m23mily . Aspirin 81mg46mly . Isosorbide 30mg 64me daily . Nitroglycerin 0.4mg as75meded . Interventions: o Discussed LDL goal . Patient self care activities - Over the next 180 days, patient will: o Continue current regimen. If next LDL >70 consider increasing pravastatin to 80mg da49m Diabetes Lab Results  Component Value Date/Time   HGBA1C 6.7 (H) 10/22/2020 03:21 PM   HGBA1C 6.5 (H) 06/19/2020 01:43 PM   . Pharmacist Clinical Goal(s): o Over the next 180 days, patient will work with PharmD and providers to maintain A1c goal <7% . Current regimen:  o Metformin 500mg dai56m Interventions: o Discussed DM goals . Patient self  care activities - Over the next 180 days, patient will: o Maintain a1c less than 7%  COPD . Pharmacist Clinical Goal(s): o Over the next 180 days, patient will work with PharmD and providers to prevent exacerbation of COPD . Current regimen:  o Advair 250/50mcg - i50me 1 puff into lungs twice a day o Albuterol inhaler - inhaler 2 puffs into lungs up to every 4 hours as needed for wheezing and shortness of breath o Acapella device . Interventions o Discussed cost of inhalers. Patient reports affordable [facility] [place] self care activities - Over the next 180 days, patient will: o Continue current medication regimen for COPD  Tachy/Brady Syndrome and Atrial Flutter . Pharmacist Clinical Goal(s): o Over the next 180 days, patient will work with PharmD and providers to continue stroke risk reduction . Current regimen: o Eliquis 5mg twice 30mly o Pacemaker placed 11/06/2020 o Metoprolol tartrate 25mg - take34m tablet (=12.5mg) daily .60mterventions o Discussed cost of Eliquis. [facility] [place] . Patient self care activities - Over the next 180 days, patient will: o Continue current regimen o Continue to follow up with cardiologist and device clinic as indicated.   Medication management . Pharmacist Clinical Goal(s): o Over the next 180 days, patient will work with PharmD and providers to maintain optimal medication adherence . [facility] o Comprehensive medication review performed. o Continue current medication management strategy . Patient self care activities - Over the next 180 days, patient will: o Focus on medication adherence by filling and taking medications appropriately o Take medications as prescribed o Report any questions or concerns to PharmD and/or provider(s)  Please see past updates related to this goal by clicking on the "Past Updates" button  in the selected goal       .  LDL goal <70   Not on track    -Discuss this goal further with your cardiologist. This goal is usually recommended for patients who have CAD (coronary artery disease)    . Manage My Medicine   On track    Timeframe:  Long-Range Goal Priority:  Medium Start Date:                             Expected End Date:                       Follow Up Date 05/21/2021    - call for medicine refill 2 or 3 days before it runs out - keep a list of all the medicines I take; vitamins and herbals too    Why is this important?   . These steps will help you keep on track with your medicines.   Notes:        Married to wife Patsy for 65 years. 3 grown children (daughter lives in [place]).  Lives with wife in [place].  Patient's hearing aids did not charge last night so phone follow up was conducted with assistance from patient's wife.   COPD    Last spirometry score: 05/21/2016  FEV1 = 37% (0.93L) FVC = 62% (2.22 L) FEV1/FVC = 42%  Gold Grade: Gold 3 (FEV1 30-49%) (per pulmonology assessment) Current COPD Classification:  A (low sx, <2 exacerbations/yr) Past smoker Eosinophil count:  3.6 (06/19/2020)                        Eos (Absolute): 0.3 (10/11/2020)  Patient has failed these meds in past: None noted (symbicort, spiriva listed in D/C meds. Symbicort changed by [facility]) Patient is currently controlled on the following medications:   Advair 250/95mg 1 puff into lungs twice a day  Albuterol 2 puffs Q4H PRN Using maintenance inhaler regularly? Yes Frequency of rescue inhaler use:  1-2x per week   Acapella device hasn't used in a while but still has at home  Plan -Continue current medications   Diabetes   A1c goal <7% Checking BG: Rarely Recent FBG Readings: 110 to 140 (mostly checked fasting)  Patient has failed these meds in past: None noted  Patient is currently controlled on the following medications:  Metformin 5057mdaily  We  discussed: DM goals  Plan -Continue current medications   Hypertension   BP goal is:  <130/80  Patient checks BP at home 1-2x per week Patient home BP readings are ranging: 120-140's / 70's  Patient has failed these meds in the past: amlodipine - stopped by Dr NiJohnsie Cancelcardio) when afib diagnosed and metoprolol started.   Patient is currently controlled on the following medications:   Metoprolol tartrate 254m/2 tabelt = 12.5mg38mily  Discussed BP goals and importance of checking BP at home and providing at doctor's office visits  Plan -Continue current medications. Consider change to metoprolol succinate 25mg60make 1/2 tablet daily in future since has longer effect that metoprolol tartrate.     Hyperlipidemia/CAD   LDL goal < 70  Patient has failed these meds in past: None noted  Patient is currently uncontrolled on the following medications:   Pravastatin 40mg 7my  Aspirin 81mg d57m  Isosorbide 30mg tw53mdaily  Nitroglycerin 0.4mg as n78med  We discussed:  LDL goal; Last LDL was improved but still slightly above 70 at 77  Plan -  Consider recheck lipid panel at next OV with PCP. If LDL still > 70 consider increasing pravastatin to 62m daily or change to more potent statin like atorvastatin 879mor rosuvastatin 4061maily.  Medication Management:  Miscellaneous Meds Azelastine 0.1% uses a few times a week at night Multivitamin daily  Meclizine as needed - per patient rarely uses meclizine.   Medication Assistance: None required.  Patient affirms current coverage meets needs. Patient has Medicare plan with [employer] and also [facility] [place] [place] and no coverage gap issues.  [facility] [id number]: [phone]: [phone] Up:   Patient agrees to Care Plan and Follow-up.  Plan: Telephone follow up appointment with care management team member scheduled for:  3 months  TamCherre RobinsharmD Clinical Pharmacist [facility]

## 2020-11-18 NOTE — Patient Instructions (Signed)
Visit Information  PATIENT GOALS: Goals Addressed            This Visit's Progress   . A1c goal <7%   On track    -Fasting (before first meal of day) blood sugar goal is 80-130 -Post-prandial (2 hrs after meal) blood sugar is less than 180    . Check blood sugar 2 to 3 times per week   On track   . Chronic Care Management Pharmacy Care Plan   On track    CARE PLAN ENTRY (see longitudinal plan of care for additional care plan information)  Current Barriers:  . Chronic Disease Management support, education, and care coordination needs related to COPD, DM, HTN, HLD, CAD, Vertigo  Hypertension BP Readings from Last 3 Encounters:  11/06/20 (!) 145/64  10/22/20 (!) 162/68  10/11/20 (!) 148/76   . Pharmacist Clinical Goal(s): o Over the next 180 days, patient will work with PharmD and providers to Schieve BP goal <130/80 . Current regimen:  . Metoprolol tartrate 25mg  = take 0.5 tablet (= 12.5mg ) daily . Interventions: o Discussed BP goals  . Patient self care activities - Over the next 180 days, patient will: o Check BP 1 to 2 times per week. Record and bring to next office appointment.  o Ensure daily salt intake < 2300 mg/day o Consider change to metoprolol succinate 25mg  0.5 tablet daily if future  Hyperlipidemia/CAD Lab Results  Component Value Date/Time   LDLCALC 77 10/22/2020 03:21 PM   . Pharmacist Clinical Goal(s): o Over the next 180 days, patient will work with PharmD and providers to achieve LDL goal < 70 . Current regimen:  . Pravastatin 40mg  daily . Aspirin 81mg  daily . Isosorbide 30mg  twice daily . Nitroglycerin 0.4mg  as needed . Interventions: o Discussed LDL goal . Patient self care activities - Over the next 180 days, patient will: o Continue current regimen. If next LDL >70 consider increasing pravastatin to 80mg  daily  Diabetes Lab Results  Component Value Date/Time   HGBA1C 6.7 (H) 10/22/2020 03:21 PM   HGBA1C 6.5 (H) 06/19/2020 01:43 PM    . Pharmacist Clinical Goal(s): o Over the next 180 days, patient will work with PharmD and providers to maintain A1c goal <7% . Current regimen:  o Metformin 500mg  daily . Interventions: o Discussed DM goals . Patient self care activities - Over the next 180 days, patient will: o Maintain a1c less than 7%  COPD . Pharmacist Clinical Goal(s): o Over the next 180 days, patient will work with PharmD and providers to prevent exacerbation of COPD . Current regimen:  o Advair 250/32mcg - inhale 1 puff into lungs twice a day o Albuterol inhaler - inhaler 2 puffs into lungs up to every 4 hours as needed for wheezing and shortness of breath o Acapella device . Interventions o Discussed cost of inhalers. Patient reports affordable since he has started to get from [place] . Patient self care activities - Over the next 180 days, patient will: o Continue current medication regimen for COPD  Tachy/Brady Syndrome and Atrial Flutter . Pharmacist Clinical Goal(s): o Over the next 180 days, patient will work with PharmD and providers to continue stroke risk reduction . Current regimen: o Eliquis 5mg  twice daily o Pacemaker placed 11/06/2020 o Metoprolol tartrate 25mg  - take 0.5 tablet (=12.5mg ) daily . Interventions o Discussed cost of Eliquis. Patient is getting thru [place] and cost is $8/month . Patient self care activities - Over the next 180 days, patient will: o  Continue current regimen o Continue to follow up with cardiologist and device clinic as indicated.   Medication management . Pharmacist Clinical Goal(s): o Over the next 180 days, patient will work with PharmD and providers to maintain optimal medication adherence . [facility] [place] . Interventions o Comprehensive medication review performed. o Continue current medication management strategy . Patient self care activities - Over the next 180 days, patient will: o Focus on medication adherence by  filling and taking medications appropriately o Take medications as prescribed o Report any questions or concerns to PharmD and/or provider(s)  Please see past updates related to this goal by clicking on the "Past Updates" button in the selected goal       . LDL goal <70   Not on track    -Discuss this goal further with your cardiologist. This goal is usually recommended for patients who have CAD (coronary artery disease)    . Manage My Medicine   On track    Timeframe:  Long-Range Goal Priority:  Medium Start Date:                             Expected End Date:                       Follow Up Date 05/21/2021    - call for medicine refill 2 or 3 days before it runs out - keep a list of all the medicines I take; vitamins and herbals too    Why is this important?   . These steps will help you keep on track with your medicines.   Notes:        Patient verbalizes understanding of instructions provided today and agrees to view in [place].   Telephone follow up appointment with care management team member scheduled for: 3 months  [name], PharmD Clinical Pharmacist [facility]

## 2020-11-19 ENCOUNTER — Ambulatory Visit (INDEPENDENT_AMBULATORY_CARE_PROVIDER_SITE_OTHER): Payer: HMO | Admitting: Emergency Medicine

## 2020-11-19 ENCOUNTER — Other Ambulatory Visit: Payer: Self-pay

## 2020-11-19 DIAGNOSIS — I495 Sick sinus syndrome: Secondary | ICD-10-CM

## 2020-11-19 NOTE — Progress Notes (Signed)
Wound check appointment. Steri-strips removed. Wound without redness or edema. Incision edges approximated, wound well healed. Normal device function. Thresholds, sensing, and impedances consistent with implant measurements. Device programmed at 3.5V/auto capture programmed on for extra safety margin until 3 month visit. Histogram distribution appropriate for patient and level of activity. No mode switches or high ventricular rates noted. Patient educated about wound care, arm mobility, lifting restrictions. Patient enrolled in remote monitoring with next transmission6/15/22. ROV with Dr. [name] 02/12/21.

## 2020-11-25 DIAGNOSIS — H6123 Impacted cerumen, bilateral: Secondary | ICD-10-CM | POA: Diagnosis not present

## 2020-11-25 DIAGNOSIS — H6122 Impacted cerumen, left ear: Secondary | ICD-10-CM | POA: Diagnosis not present

## 2020-11-25 DIAGNOSIS — H6121 Impacted cerumen, right ear: Secondary | ICD-10-CM | POA: Diagnosis not present

## 2020-12-17 ENCOUNTER — Telehealth: Payer: Self-pay | Admitting: Internal Medicine

## 2020-12-17 NOTE — Telephone Encounter (Signed)
Caller : Anselm Lis, [employer]  Call Back @ [phone]  Suanne Marker, RN  is with patient's health insurance company and would like her  information added to patient chart, Per Suanne Marker if she can help in any way with patient and chronic disease care just give her a call.

## 2020-12-17 NOTE — Telephone Encounter (Signed)
Noted  

## 2020-12-26 ENCOUNTER — Telehealth: Payer: Self-pay | Admitting: Internal Medicine

## 2020-12-26 NOTE — Telephone Encounter (Signed)
Called and spoke with Patient's Wife Patsy (DPR).  Patsy stated Patient has experience hoarseness for several weeks, gets sob, and winded easily. Patient stated Dr. [name] would always prescribe Prednisone to Patient when had increased sob.  Patsy requested OV with Dr. [name] for follow up.  Patsy stated Patient received a pace maker 10/2020 and felt Patient would benefit from [place]. Dr. Gustavus Bryant first OV is 01/14/21.  Offered OV with Tammy, NP 12/27/20 at 10am.  Patsy stated Patient accepted that appointment. Patient scheduled with Tammy,NP. Nothing further at this time.

## 2020-12-27 ENCOUNTER — Ambulatory Visit: Payer: HMO | Admitting: Adult Health

## 2020-12-27 ENCOUNTER — Encounter: Payer: Self-pay | Admitting: Adult Health

## 2020-12-27 ENCOUNTER — Other Ambulatory Visit: Payer: Self-pay

## 2020-12-27 ENCOUNTER — Ambulatory Visit (INDEPENDENT_AMBULATORY_CARE_PROVIDER_SITE_OTHER): Payer: HMO

## 2020-12-27 VITALS — BP 138/60 | HR 75 | Temp 97.1°F | Ht 69.0 in | Wt 171.0 lb

## 2020-12-27 DIAGNOSIS — J309 Allergic rhinitis, unspecified: Secondary | ICD-10-CM | POA: Diagnosis not present

## 2020-12-27 DIAGNOSIS — R490 Dysphonia: Secondary | ICD-10-CM

## 2020-12-27 DIAGNOSIS — J441 Chronic obstructive pulmonary disease with (acute) exacerbation: Secondary | ICD-10-CM

## 2020-12-27 DIAGNOSIS — J439 Emphysema, unspecified: Secondary | ICD-10-CM | POA: Diagnosis not present

## 2020-12-27 MED ORDER — AZITHROMYCIN 250 MG PO TABS: ORAL_TABLET | ORAL | 0 refills | Status: AC

## 2020-12-27 MED ORDER — PREDNISONE 10 MG PO TABS: ORAL_TABLET | ORAL | 0 refills | Status: DC

## 2020-12-27 NOTE — Progress Notes (Signed)
@Patient  ID: Phillip Jordan, male    DOB: 23-Oct-1929, 87 y.o.   MRN: [id number]  Chief Complaint  Patient presents with  . Follow-up    Referring provider: Colon Branch, MD  HPI: 87 year old male former smoker followed for Moderate to severe COPD  TEST/EVENTS :  (fev1 1.38L/44%, ratio 51 in Jan 2009).   PFT's 07/02/11  FEV1  1.41 (55%) ratio 66 and 12% better p B2 with DLCO 96% - PFTs  07/05/2013 FEV1  1.05 and 1.31 p B2 (24%) with DLCO 59 corrects to 84%  - Rehab completed Aug 2017  - PFT's  05/21/2016  FEV1 1.23 (50 % ) ratio 45  p 32 %  12/27/2020 Follow up : COPD  Patient returns for a follow-up visit.  Patient complains that over the last few weeks he has not been feeling as well.  Says he caught a cold about 2 to 3 weeks ago and has had increased cough congestion and hoarseness.  Says that his voice is very raspy.  He denies dysphagia.  No fever, chest pain, orthopnea, edema.  He remains on Wiexla -gets from [place] system.  Complains has had some intermittent wheezing that is increased in the last week. Patient says he has a history of some chronic hoarseness on and off.  Has been seen by ENT in the past in 2012 with reported negative work-up.   Appetite is good with no nausea vomiting or diarrhea.  Does get short of breath with heavy activities.  Not on File  Immunization History  Administered Date(s) Administered  . Fluad Quad(high Dose 65+) 05/11/2019  . Influenza Split 05/26/2011, 06/01/2012  . Influenza Whole 06/30/2007, 06/18/2008, 06/14/2009, 04/24/2010  . Influenza, High Dose Seasonal PF 06/19/2014, 05/02/2015, 06/08/2017, 05/16/2018  . Influenza,inj,Quad PF,6+ Mos 06/10/2013  . Influenza-Unspecified 05/21/2016, 05/16/2020  . Moderna SARS-COV2 Booster Vaccination 06/24/2020  . Moderna Sars-Covid-2 Vaccination 09/05/2019, 10/03/2019  . Pneumococcal Conjugate-13 09/10/2015  . Pneumococcal Polysaccharide-23 08/24/2001, 06/30/2007, 06/01/2012  . Td 08/24/2005, 07/06/2016   . Tdap 09/12/2005  . Zoster 10/13/2007  . Zoster Recombinat (Shingrix) 09/01/2018, 11/01/2018    Past Medical History:  Diagnosis Date  . Allergic rhinitis   . Aortic dissection ([place])    f/u by cards - AAA with distal dissection.  [name] CAD (coronary artery disease)    a. s/p MI 1992. b. Prior hx stenting to RCA/Cx. b. 05/2013: [place] s/p DES to distal RCA then staged DES to prox LAD.   [name] COPD with asthma ([place])   . Diabetes mellitus   . Hyperlipidemia   . Lichen planus    Right midline inferior chest, Dr. [name] Sensing  . SCC (squamous cell carcinoma)   . Shingles     Tobacco History: Social History   Tobacco Use  Smoking Status Former Smoker  . Packs/day: 1.50  . Years: 39.00  . Pack years: 58.50  . Types: Cigarettes  . Quit date: 08/24/1992  . Years since quitting: 28.3  Smokeless Tobacco Never Used   Counseling given: Not Answered   Outpatient Medications Prior to Visit  Medication Sig Dispense Refill  . albuterol (VENTOLIN HFA) 108 (90 Base) MCG/ACT inhaler Inhale 2 puffs into the lungs every 4 (four) hours as needed for wheezing or shortness of breath. 9 g 5  . apixaban (ELIQUIS) 5 MG TABS tablet Take 1 tablet (5 mg total) by mouth 2 (two) times daily. 60 tablet 11  . aspirin EC 81 MG tablet Take 81 mg by mouth daily. Swallow  whole.    . azelastine (ASTELIN) 0.1 % nasal spray Place 2 sprays into both nostrils at bedtime as needed for rhinitis. Use in each nostril as directed 30 mL 3  . COVID-19 mRNA vaccine, Moderna, 100 MCG/0.5ML injection INJECT AS DIRECTED .25 mL 0  . fluticasone (FLONASE) 50 MCG/ACT nasal spray Place 2 sprays into both nostrils daily as needed for allergies or rhinitis.    . Fluticasone-Salmeterol (ADVAIR) 250-50 MCG/DOSE AEPB Inhale 1 puff into the lungs 2 (two) times daily.    [name] glucose blood (FREESTYLE TEST STRIPS) test strip Check blood sugar no more than twice daily 200 each 12  . isosorbide mononitrate (IMDUR) 30 MG 24 hr tablet Take 1 tablet (30  mg total) by mouth 2 (two) times daily. 180 tablet 2  . meclizine (ANTIVERT) 12.5 MG tablet Take 1 tablet (12.5 mg total) 3 (three) times daily as needed by mouth for dizziness. 30 tablet 0  . metFORMIN (GLUCOPHAGE) 500 MG tablet Take 1 tablet (500 mg total) by mouth daily with breakfast. 90 tablet 1  . metoprolol tartrate (LOPRESSOR) 25 MG tablet Take 0.5 tablets (12.5 mg total) by mouth daily. 45 tablet 3  . Misc. Devices (ACAPELLA) MISC Use as directed 1 each 0  . Multiple Vitamin (MULTIVITAMIN WITH MINERALS) TABS tablet Take 1 tablet by mouth daily.    . nitroGLYCERIN (NITROSTAT) 0.4 MG SL tablet Place 1 tablet (0.4 mg total) under the tongue every 5 (five) minutes as needed for chest pain (up to 3 doses only.). 25 tablet 3  . pravastatin (PRAVACHOL) 40 MG tablet Take 1 tablet (40 mg total) by mouth daily. 90 tablet 1   No facility-administered medications prior to visit.     Review of Systems:   Constitutional:   No  weight loss, night sweats,  Fevers, chills, fatigue, or  lassitude.  HEENT:   No headaches,  Difficulty swallowing,  Tooth/dental problems, or  Sore throat,                No sneezing, itching, ear ache, +nasal congestion, post nasal drip,   CV:  No chest pain,  Orthopnea, PND, swelling in lower extremities, anasarca, dizziness, palpitations, syncope.   GI  No heartburn, indigestion, abdominal pain, nausea, vomiting, diarrhea, change in bowel habits, loss of appetite, bloody stools.   Resp:    No chest wall deformity  Skin: no rash or lesions.  GU: no dysuria, change in color of urine, no urgency or frequency.  No flank pain, no hematuria   MS:  No joint pain or swelling.  No decreased range of motion.  No back pain.    Physical Exam  BP 138/60 (BP Location: Left Arm, Patient Position: Sitting, Cuff Size: Normal)   Pulse 75   Temp (!) 97.1 F (36.2 C) (Temporal)   Ht 5\' 9"  (1.753 m)   Wt 171 lb (77.6 kg)   SpO2 95%   BMI 25.25 kg/m   GEN: A/Ox3;  pleasant , NAD, well nourished    HEENT:  Power/AT,   NOSE-clear, THROAT-clear, no lesions, no postnasal drip or exudate noted.  Posterior pharynx is clear with no sign of exudate or thrush.  NECK:  Supple w/ fair ROM; no JVD; normal carotid impulses w/o bruits; no thyromegaly or nodules palpated; no lymphadenopathy.    RESP faint expiratory wheezing on forced expiration speaks in full sentences ,  no accessory muscle use, no dullness to percussion  CARD:  RRR, no m/r/g, no peripheral edema, pulses  intact, no cyanosis or clubbing.  GI:   Soft & nt; nml bowel sounds; no organomegaly or masses detected.   Musco: Warm bil, no deformities or joint swelling noted.   Neuro: alert, no focal deficits noted.    Skin: Warm, no lesions or rashes    Lab Results:   BMET  BNP No results found for: BNP   Imaging: No results found.    PFT Results Latest Ref Rng & Units 05/21/2016  FVC-Pre L 2.22  FVC-Predicted Pre % 62  FVC-Post L 2.74  FVC-Predicted Post % 77  Pre FEV1/FVC % % 42  Post FEV1/FCV % % 45  FEV1-Pre L 0.93  FEV1-Predicted Pre % 37  FEV1-Post L 1.23  DLCO uncorrected ml/min/mmHg 16.84  DLCO UNC% % 56  DLCO corrected ml/min/mmHg 17.14  DLCO COR %Predicted % 57  DLVA Predicted % 72  TLC L 6.56  TLC % Predicted % 98  RV % Predicted % 133    No results found for: NITRICOXIDE      Assessment & Plan:   COPD exacerbation (HCC) Acute exacerbation-we will treat with empiric antibiotics and steroids. Oral care with inhaler use.  If hoarseness does not resolve will need referral to ENT  Plan  Patient Instructions  Zpack take as directed .  Prednisone taper over next week  Mucinex DM Twice daily  As needed  Cough/congestion .  Claritin 10mg  At bedtime  For 1 week and then As needed   Continue on Wixela Twice daily  , rinse well after use.  Chest xray today .  Salt water gargles As needed   Follow up with Dr. [name]  In 6-8 weeks and As needed   If hoarseness does  not resolve , will need referral to ENT .  Please contact office for sooner follow up if symptoms do not improve or worsen or seek emergency care       Allergic rhinitis Mild flare add Claritin.  Plan  Patient Instructions  Zpack take as directed .  Prednisone taper over next week  Mucinex DM Twice daily  As needed  Cough/congestion .  Claritin 10mg  At bedtime  For 1 week and then As needed   Continue on Wixela Twice daily  , rinse well after use.  Chest xray today .  Salt water gargles As needed   Follow up with Dr. [name]  In 6-8 weeks and As needed   If hoarseness does not resolve , will need referral to ENT .  Please contact office for sooner follow up if symptoms do not improve or worsen or seek emergency care        Hoarseness of voice Hoarseness possibly secondary to acute illness.  We will treat with empiric antibiotics and steroids.  Oral inhaler care discussed.  If does not resolve will need referral to ENT.  Previous work-up reported as negative in 2012     Rexene Edison, NP 12/27/2020

## 2020-12-27 NOTE — Patient Instructions (Signed)
Zpack take as directed .  Prednisone taper over next week  Mucinex DM Twice daily  As needed  Cough/congestion .  Claritin 10mg  At bedtime  For 1 week and then As needed   Continue on Wixela Twice daily  , rinse well after use.  Chest xray today .  Salt water gargles As needed   Follow up with Dr. [name]  In 6-8 weeks and As needed   If hoarseness does not resolve , will need referral to ENT .  Please contact office for sooner follow up if symptoms do not improve or worsen or seek emergency care

## 2020-12-27 NOTE — Assessment & Plan Note (Signed)
Mild flare add Claritin.  Plan  Patient Instructions  Zpack take as directed .  Prednisone taper over next week  Mucinex DM Twice daily  As needed  Cough/congestion .  Claritin 10mg  At bedtime  For 1 week and then As needed   Continue on Wixela Twice daily  , rinse well after use.  Chest xray today .  Salt water gargles As needed   Follow up with Dr. [name]  In 6-8 weeks and As needed   If hoarseness does not resolve , will need referral to ENT .  Please contact office for sooner follow up if symptoms do not improve or worsen or seek emergency care

## 2020-12-27 NOTE — Assessment & Plan Note (Signed)
Hoarseness possibly secondary to acute illness.  We will treat with empiric antibiotics and steroids.  Oral inhaler care discussed.  If does not resolve will need referral to ENT.  Previous work-up reported as negative in 2012

## 2020-12-27 NOTE — Assessment & Plan Note (Signed)
Acute exacerbation-we will treat with empiric antibiotics and steroids. Oral care with inhaler use.  If hoarseness does not resolve will need referral to ENT  Plan  Patient Instructions  Zpack take as directed .  Prednisone taper over next week  Mucinex DM Twice daily  As needed  Cough/congestion .  Claritin 10mg  At bedtime  For 1 week and then As needed   Continue on Wixela Twice daily  , rinse well after use.  Chest xray today .  Salt water gargles As needed   Follow up with Dr. [name]  In 6-8 weeks and As needed   If hoarseness does not resolve , will need referral to ENT .  Please contact office for sooner follow up if symptoms do not improve or worsen or seek emergency care

## 2020-12-30 ENCOUNTER — Other Ambulatory Visit: Payer: Self-pay

## 2020-12-30 ENCOUNTER — Ambulatory Visit: Payer: HMO | Admitting: Podiatry

## 2020-12-30 DIAGNOSIS — L97512 Non-pressure chronic ulcer of other part of right foot with fat layer exposed: Secondary | ICD-10-CM

## 2020-12-30 DIAGNOSIS — E0843 Diabetes mellitus due to underlying condition with diabetic autonomic (poly)neuropathy: Secondary | ICD-10-CM

## 2020-12-31 MED ORDER — AZITHROMYCIN 500 MG PO TABS: 500.0000 mg | ORAL_TABLET | Freq: Every day | ORAL | 0 refills | Status: DC

## 2021-01-03 ENCOUNTER — Ambulatory Visit: Payer: HMO | Admitting: Primary Care

## 2021-01-03 ENCOUNTER — Other Ambulatory Visit: Payer: Self-pay

## 2021-01-03 ENCOUNTER — Ambulatory Visit (INDEPENDENT_AMBULATORY_CARE_PROVIDER_SITE_OTHER): Payer: HMO

## 2021-01-03 ENCOUNTER — Ambulatory Visit: Payer: HMO | Admitting: Adult Health

## 2021-01-03 VITALS — BP 124/68 | HR 71 | Temp 97.9°F | Ht 69.0 in | Wt 169.0 lb

## 2021-01-03 DIAGNOSIS — J189 Pneumonia, unspecified organism: Secondary | ICD-10-CM

## 2021-01-03 DIAGNOSIS — R918 Other nonspecific abnormal finding of lung field: Secondary | ICD-10-CM | POA: Diagnosis not present

## 2021-01-03 DIAGNOSIS — J439 Emphysema, unspecified: Secondary | ICD-10-CM | POA: Diagnosis not present

## 2021-01-03 NOTE — Progress Notes (Signed)
Please let patient know CXR showed persistent opacities left >right. Keep follow-up with Dr. [name] in June. Would consider repeat CXR at that visit versus CT chest

## 2021-01-03 NOTE — Patient Instructions (Addendum)
Recommendations: - Continue Mucinex twice daily for additional week - Continue Advair one puffs twice daily (rinse mouth after use) - If voice hoarseness not better in 2 weeks please let us know and we will refer you to ENT   Orders: - CXR today   Follow-up: - June 17th with Dr. [name] - If CXR is normal and you are feeling better you can cancel   - September 13th with DR. Arlyss Gandy- keep this apt

## 2021-01-03 NOTE — Progress Notes (Signed)
@Patient  ID: Phillip Jordan, male    DOB: Aug 14, 1930, 87 y.o.   MRN: [id number]  Chief Complaint  Patient presents with  . Follow-up    Reports improvement in shortness of breath    Referring provider: Colon Branch, MD  HPI: 87 year old male former smoker followed for Moderate to severe COPD. Patient of Dr. [name], last seen by pulmonary NP on 12/27/20.   Previous [facility] pulmonary encounter:  12/27/2020 Follow up : COPD  Patient returns for a follow-up visit.  Patient complains that over the last few weeks he has not been feeling as well.  Says he caught a cold about 2 to 3 weeks ago and has had increased cough congestion and hoarseness.  Says that his voice is very raspy.  He denies dysphagia.  No fever, chest pain, orthopnea, edema.  He remains on Wiexla -gets from [place] system.  Complains has had some intermittent wheezing that is increased in the last week. Patient says he has a history of some chronic hoarseness on and off.  Has been seen by ENT in the past in 2012 with reported negative work-up.   Appetite is good with no nausea vomiting or diarrhea.  Does get short of breath with heavy activities.  01/03/2021- Interim hx  Patient presents today for 1 week follow-up PNA. He completed Zpack and prednisoen taper as prescribed. He is feeling a great deal better. He still has a dry cough and voice hoarsness. He is taking mucinex. He is not experiencing as much shortness of breath. He has had a negative home covid test. Denies f/c/s, chest tightness or wheezing.  No Known Allergies  Immunization History  Administered Date(s) Administered  . Fluad Quad(high Dose 65+) 05/11/2019  . Influenza Split 05/26/2011, 06/01/2012  . Influenza Whole 06/30/2007, 06/18/2008, 06/14/2009, 04/24/2010  . Influenza, High Dose Seasonal PF 06/19/2014, 05/02/2015, 06/08/2017, 05/16/2018  . Influenza,inj,Quad PF,6+ Mos 06/10/2013  . Influenza-Unspecified 05/21/2016, 05/16/2020  . Moderna SARS-COV2 Booster Vaccination  06/24/2020  . Moderna Sars-Covid-2 Vaccination 09/05/2019, 10/03/2019  . Pneumococcal Conjugate-13 09/10/2015  . Pneumococcal Polysaccharide-23 08/24/2001, 06/30/2007, 06/01/2012  . Td 08/24/2005, 07/06/2016  . Tdap 09/12/2005  . Zoster 10/13/2007  . Zoster Recombinat (Shingrix) 09/01/2018, 11/01/2018    Past Medical History:  Diagnosis Date  . Allergic rhinitis   . Aortic dissection ([place])    f/u by cards - AAA with distal dissection.  [name] CAD (coronary artery disease)    a. s/p MI 1992. b. Prior hx stenting to RCA/Cx. b. 05/2013: [place] s/p DES to distal RCA then staged DES to prox LAD.   [name] COPD with asthma ([place])   . Diabetes mellitus   . Hyperlipidemia   . Lichen planus    Right midline inferior chest, Dr. [name] Sensing  . SCC (squamous cell carcinoma)   . Shingles     Tobacco History: Social History   Tobacco Use  Smoking Status Former Smoker  . Packs/day: 1.50  . Years: 39.00  . Pack years: 58.50  . Types: Cigarettes  . Quit date: 08/24/1992  . Years since quitting: 28.3  Smokeless Tobacco Never Used   Counseling given: Not Answered   Outpatient Medications Prior to Visit  Medication Sig Dispense Refill  . albuterol (VENTOLIN HFA) 108 (90 Base) MCG/ACT inhaler Inhale 2 puffs into the lungs every 4 (four) hours as needed for wheezing or shortness of breath. 9 g 5  . apixaban (ELIQUIS) 5 MG TABS tablet Take 1 tablet (5 mg total) by mouth 2 (two)  times daily. 60 tablet 11  . aspirin EC 81 MG tablet Take 81 mg by mouth daily. Swallow whole.    [name] azelastine (ASTELIN) 0.1 % nasal spray Place 2 sprays into both nostrils at bedtime as needed for rhinitis. Use in each nostril as directed 30 mL 3  . dextromethorphan-guaiFENesin (MUCINEX DM) 30-600 MG 12hr tablet Take 1 tablet by mouth 2 (two) times daily as needed for cough.    . fluticasone (FLONASE) 50 MCG/ACT nasal spray Place 2 sprays into both nostrils daily as needed for allergies or rhinitis.    . Fluticasone-Salmeterol  (ADVAIR) 250-50 MCG/DOSE AEPB Inhale 1 puff into the lungs 2 (two) times daily.    [name] glucose blood (FREESTYLE TEST STRIPS) test strip Check blood sugar no more than twice daily 200 each 12  . isosorbide mononitrate (IMDUR) 30 MG 24 hr tablet Take 1 tablet (30 mg total) by mouth 2 (two) times daily. 180 tablet 2  . meclizine (ANTIVERT) 12.5 MG tablet Take 1 tablet (12.5 mg total) 3 (three) times daily as needed by mouth for dizziness. 30 tablet 0  . metFORMIN (GLUCOPHAGE) 500 MG tablet Take 1 tablet (500 mg total) by mouth daily with breakfast. 90 tablet 1  . metoprolol tartrate (LOPRESSOR) 25 MG tablet Take 0.5 tablets (12.5 mg total) by mouth daily. 45 tablet 3  . Misc. Devices (ACAPELLA) MISC Use as directed 1 each 0  . Multiple Vitamin (MULTIVITAMIN WITH MINERALS) TABS tablet Take 1 tablet by mouth daily.    . nitroGLYCERIN (NITROSTAT) 0.4 MG SL tablet Place 1 tablet (0.4 mg total) under the tongue every 5 (five) minutes as needed for chest pain (up to 3 doses only.). 25 tablet 3  . pravastatin (PRAVACHOL) 40 MG tablet Take 1 tablet (40 mg total) by mouth daily. 90 tablet 1  . COVID-19 mRNA vaccine, Moderna, 100 MCG/0.5ML injection INJECT AS DIRECTED (Patient not taking: Reported on 01/03/2021) .25 mL 0  . azithromycin (ZITHROMAX) 500 MG tablet Take 1 tablet (500 mg total) by mouth daily. 3 tablet 0  . predniSONE (DELTASONE) 10 MG tablet 4 tabs for 2 days, then 3 tabs for 2 days, 2 tabs for 2 days, then 1 tab for 2 days, then stop 20 tablet 0   No facility-administered medications prior to visit.    Review of Systems  Review of Systems  Constitutional: Negative.  Negative for chills and fever.  HENT:       Voice hoarseness   Respiratory: Positive for cough.   Cardiovascular: Negative.     Physical Exam  BP 124/68 (BP Location: Left Arm, Cuff Size: Normal)   Pulse 71   Temp 97.9 F (36.6 C) (Temporal)   Ht 5\' 9"  (1.753 m)   Wt 169 lb (76.7 kg)   SpO2 94% Comment: RA  BMI 24.96  kg/m  Physical Exam Constitutional:      Appearance: Normal appearance.  HENT:     Mouth/Throat:     Comments: Deferred d.t masking Cardiovascular:     Rate and Rhythm: Normal rate and regular rhythm.  Pulmonary:     Effort: Pulmonary effort is normal.     Breath sounds: Normal breath sounds.  Musculoskeletal:        General: Normal range of motion.  Skin:    General: Skin is warm.  Neurological:     General: No focal deficit present.     Mental Status: He is alert. Mental status is at baseline.  Psychiatric:  Mood and Affect: Mood normal.        Thought Content: Thought content normal.        Judgment: Judgment normal.      Lab Results:  CBC    Component Value Date/Time   WBC 10.3 10/11/2020 0953   WBC 12.0 (H) 06/19/2020 1343   RBC 4.95 10/11/2020 0953   RBC 4.83 06/19/2020 1343   HGB 15.2 10/11/2020 0953   HCT 44.1 10/11/2020 0953   PLT 282 10/11/2020 0953   MCV 89 10/11/2020 0953   MCH 30.7 10/11/2020 0953   MCH 31.1 06/19/2020 1343   MCHC 34.5 10/11/2020 0953   MCHC 34.2 06/19/2020 1343   RDW 11.7 10/11/2020 0953   LYMPHSABS 1.6 10/11/2020 0953   MONOABS 1.0 12/13/2019 1526   EOSABS 0.3 10/11/2020 0953   BASOSABS 0.1 10/11/2020 0953    BMET    Component Value Date/Time   NA 134 10/11/2020 0953   K 4.8 10/11/2020 0953   CL 94 (L) 10/11/2020 0953   CO2 22 10/11/2020 0953   GLUCOSE 111 (H) 10/11/2020 0953   GLUCOSE 209 (H) 06/24/2020 1021   GLUCOSE 121 (H) 06/17/2006 1051   BUN 14 10/11/2020 0953   CREATININE 0.92 10/11/2020 0953   CREATININE 1.09 06/24/2020 1021   CALCIUM 9.6 10/11/2020 0953   GFRNONAA 74 10/11/2020 0953   GFRAA 86 10/11/2020 0953    BNP No results found for: BNP  ProBNP    Component Value Date/Time   PROBNP 64.5 11/01/2008 0010    Imaging: DG Chest 2 View  Result Date: 01/03/2021 CLINICAL DATA:  Follow-up pneumonia EXAM: CHEST - 2 VIEW COMPARISON:  12/27/2020, 11/06/2020, 01/15/2017, 07/13/2016 FINDINGS:  Left-sided pacing device as before. Left greater than right reticular opacities without significant change. Similar mild ground-glass opacity in the left lung base. Normal cardiomediastinal silhouette with aortic atherosclerosis. No pneumothorax. Emphysematous disease IMPRESSION: No significant change in appearance of left greater than right reticular and ground-glass opacity at the bases. The appearance suggests chronic interstitial lung disease. Further evaluation with nonemergent high-resolution chest CT is suggested. Electronically Signed   By: Donavan Foil M.D.   On: 01/03/2021 17:06   DG Chest 2 View  Result Date: 12/30/2020 CLINICAL DATA:  87 year old male with a history of cough EXAM: CHEST - 2 VIEW COMPARISON:  11/06/2020 FINDINGS: Cardiomediastinal silhouette unchanged in size and contour. Unchanged position of the left-sided cardiac pacing device with 2 leads in place. Reticulonodular opacities at the bilateral lung bases worst on the left and progressed from the comparison. No pneumothorax. No pleural effusion. Stigmata of emphysema, with increased retrosternal airspace, flattened hemidiaphragms, increased AP diameter, and hyperinflation on the AP view. Degenerative changes the spine.  No acute displaced fracture IMPRESSION: Increased reticulonodular opacities of the left greater than right lung bases concerning for multifocal infection. Emphysema. Unchanged cardiac pacing device. Electronically Signed   By: Corrie Mckusick D.O.   On: 12/30/2020 08:56     Assessment & Plan:   Opacities of both lungs present on chest x-ray - Patient was treated for suspected pneumonia on 12/27/20 with azithromycin x 5 days and prednisone taper. He is feeling a great deal better. Still has a residual dry cough and voice hoarseness but shortness of breath has improved. CXR showed no significant change in appearance of left greater than right reticular and GGO at the bases. The appearance suggested chronic  interstitial lung disease.  Recommend he keep his apt scheduled in June with Dr. [name]- consider high resolution  CT.   Recommendations: - Continue Mucinex twice daily for additional week - Continue Advair one puffs twice daily (rinse mouth after use) - If voice hoarseness not better in 2 weeks please let us know and we will refer you to ENT   Orders: - CXR today   Follow-up: - Keep June 17th with Dr. [name] if CXR remains abnormal    Martyn Ehrich, NP 01/06/2021

## 2021-01-06 ENCOUNTER — Encounter: Payer: Self-pay | Admitting: Primary Care

## 2021-01-06 DIAGNOSIS — R918 Other nonspecific abnormal finding of lung field: Secondary | ICD-10-CM | POA: Insufficient documentation

## 2021-01-06 NOTE — Assessment & Plan Note (Signed)
-   Patient was treated for suspected pneumonia on 12/27/20 with azithromycin x 5 days and prednisone taper. He is feeling a great deal better. Still has a residual dry cough and voice hoarseness but shortness of breath has improved. CXR showed no significant change in appearance of left greater than right reticular and GGO at the bases. The appearance suggested chronic interstitial lung disease.  Recommend he keep his apt scheduled in June with Dr. [name]- consider high resolution CT.   Recommendations: - Continue Mucinex twice daily for additional week - Continue Advair one puffs twice daily (rinse mouth after use) - If voice hoarseness not better in 2 weeks please let us know and we will refer you to ENT   Orders: - CXR today   Follow-up: - Keep June 17th with Dr. [name] if CXR remains abnormal

## 2021-01-07 ENCOUNTER — Telehealth: Payer: Self-pay | Admitting: Pharmacist

## 2021-01-07 ENCOUNTER — Telehealth: Payer: Self-pay | Admitting: Primary Care

## 2021-01-07 NOTE — Telephone Encounter (Signed)
Phillip Ehrich, NP  Phillip Jordan, Phillip Jordan, CMA Please let patient know CXR showed persistent opacities left >right. Keep follow-up with Dr. [name] in June. Would consider repeat CXR at that visit versus CT chest    Called and spoke with pt's wife Phillip Jordan letting her know the results of pt's cxr and she verbalized understanding.   While speaking with Phillip Jordan, she stated that Dr. [name] did not have any openings in June so pt was made an appt with Dr. [name] 7/5. Beth, please advise if you are okay with this or if pt needs to have an appt with you sooner?

## 2021-01-07 NOTE — Chronic Care Management (AMB) (Signed)
Chronic Care Management Pharmacy Assistant   Name: Phillip Jordan  MRN: [id number] DOB: 10/26/1929 .  Reason for Encounter: Disease State Hypertension     Recent office visits:  None noted  Recent consult visits:  01/03/2021 Daine Gravel (Pulmonology) Follow up on COPD. Follow up in June. 12/27/2020 Rexene Edison, NP (Pulmonology) Follow up COPD. Start on Azithromycin 250 mg and Prednisone 10 mg. Follow up with Dr. [name] 6-8 weeks. 11/25/2020 Jolene Provost PA-C (ENT) Seen for Cerumen Impaction. Follow up in 3 months.  Hospital visits:  None in previous 6 months  Medications: Outpatient Encounter Medications as of 01/07/2021  Medication Sig  . albuterol (VENTOLIN HFA) 108 (90 Base) MCG/ACT inhaler Inhale 2 puffs into the lungs every 4 (four) hours as needed for wheezing or shortness of breath.  [name] apixaban (ELIQUIS) 5 MG TABS tablet Take 1 tablet (5 mg total) by mouth 2 (two) times daily.  [name] aspirin EC 81 MG tablet Take 81 mg by mouth daily. Swallow whole.  [name] azelastine (ASTELIN) 0.1 % nasal spray Place 2 sprays into both nostrils at bedtime as needed for rhinitis. Use in each nostril as directed  . COVID-19 mRNA vaccine, Moderna, 100 MCG/0.5ML injection INJECT AS DIRECTED (Patient not taking: Reported on 01/03/2021)  . dextromethorphan-guaiFENesin (MUCINEX DM) 30-600 MG 12hr tablet Take 1 tablet by mouth 2 (two) times daily as needed for cough.  . fluticasone (FLONASE) 50 MCG/ACT nasal spray Place 2 sprays into both nostrils daily as needed for allergies or rhinitis.  . Fluticasone-Salmeterol (ADVAIR) 250-50 MCG/DOSE AEPB Inhale 1 puff into the lungs 2 (two) times daily.  [name] glucose blood (FREESTYLE TEST STRIPS) test strip Check blood sugar no more than twice daily  . isosorbide mononitrate (IMDUR) 30 MG 24 hr tablet Take 1 tablet (30 mg total) by mouth 2 (two) times daily.  . meclizine (ANTIVERT) 12.5 MG tablet Take 1 tablet (12.5 mg total) 3 (three) times daily as needed  by mouth for dizziness.  . metFORMIN (GLUCOPHAGE) 500 MG tablet Take 1 tablet (500 mg total) by mouth daily with breakfast.  . metoprolol tartrate (LOPRESSOR) 25 MG tablet Take 0.5 tablets (12.5 mg total) by mouth daily.  . Misc. Devices (ACAPELLA) MISC Use as directed  . Multiple Vitamin (MULTIVITAMIN WITH MINERALS) TABS tablet Take 1 tablet by mouth daily.  . nitroGLYCERIN (NITROSTAT) 0.4 MG SL tablet Place 1 tablet (0.4 mg total) under the tongue every 5 (five) minutes as needed for chest pain (up to 3 doses only.).  [name] pravastatin (PRAVACHOL) 40 MG tablet Take 1 tablet (40 mg total) by mouth daily.   No facility-administered encounter medications on file as of 01/07/2021.   Reviewed chart prior to disease state call. Spoke with patient regarding BP  Recent Office Vitals: BP Readings from Last 3 Encounters:  01/03/21 124/68  12/27/20 138/60  11/06/20 (!) 145/64   Pulse Readings from Last 3 Encounters:  01/03/21 71  12/27/20 75  11/06/20 (!) 59    Wt Readings from Last 3 Encounters:  01/03/21 169 lb (76.7 kg)  12/27/20 171 lb (77.6 kg)  11/06/20 172 lb (78 kg)     Kidney Function Lab Results  Component Value Date/Time   CREATININE 0.92 10/11/2020 09:53 AM   CREATININE 0.97 08/29/2020 11:40 AM   CREATININE 1.09 06/24/2020 10:21 AM   CREATININE 1.03 06/19/2020 01:43 PM   GFR 62.58 12/13/2019 03:26 PM   GFRNONAA 74 10/11/2020 09:53 AM   GFRAA 86 10/11/2020 09:53 AM  BMP Latest Ref Rng & Units 10/11/2020 08/29/2020 06/24/2020  Glucose 65 - 99 mg/dL 111(H) 87 209(H)  BUN 8 - 27 mg/dL 14 14 12   Creatinine 0.76 - 1.27 mg/dL 0.92 0.97 1.09  BUN/Creat Ratio 10 - 24 15 14  NOT APPLICABLE  Sodium 518 - 144 mmol/L 134 136 135  Potassium 3.5 - 5.2 mmol/L 4.8 5.4(H) 5.3  Chloride 96 - 106 mmol/L 94(L) 96 97(L)  CO2 20 - 29 mmol/L 22 25 28   Calcium 8.6 - 10.2 mg/dL 9.6 10.1 10.0    . Current antihypertensive regimen:   Metoprolol tartrate 25mg  = take 0.5 tablet (= 12.5mg )  daily  . How often are you checking your Blood Pressure? daily   . Current home BP readings:   01/03/21-128/74 Pulse 64  05/16/22am 125/108 Pulse 73  05/16//22pm 110/80 Pulse 60  . What recent interventions/DTPs have been made by any provider to improve Blood Pressure control since last CPP Visit: None noted  . Any recent hospitalizations or ED visits since last visit with CPP? No   . What diet changes have been made to improve Blood Pressure Control?  o Patient states he has not made any diet changes at this time  . What exercise is being done to improve your Blood Pressure Control?  o Patient states he walks daily in the parking lot.  Adherence Review: Is the patient currently on ACE/ARB medication? No Does the patient have >5 day gap between last estimated fill dates? No    Star Rating Drugs: Metformin 500 mg Last filled:11/11/2020 90DS Pravastatin 40 mg Last filled:11/11/2020 90DS  [facility] Clinical Pharmacist Assistant [phone]

## 2021-01-07 NOTE — Telephone Encounter (Signed)
Called and spoke with Phillip Jordan letting her know that Eustaquio Maize said it was okay for pt to keep appt which is currently scheduled 7/5 with MW. Tessie Fass verbalized understanding. Nothing further needed.

## 2021-01-07 NOTE — Telephone Encounter (Signed)
He had an apt scheduled on June 17th that looks like it was cancelled. July 5th is fine.

## 2021-01-13 DIAGNOSIS — H6121 Impacted cerumen, right ear: Secondary | ICD-10-CM | POA: Diagnosis not present

## 2021-01-13 DIAGNOSIS — H6123 Impacted cerumen, bilateral: Secondary | ICD-10-CM | POA: Diagnosis not present

## 2021-01-13 DIAGNOSIS — H6122 Impacted cerumen, left ear: Secondary | ICD-10-CM | POA: Diagnosis not present

## 2021-01-13 NOTE — Progress Notes (Signed)
   Subjective:  87 y.o. male  presenting today for follow-up evaluation of an ulcer to the plantar aspect of the right first MTPJ.  Patient has a very high arch foot that causes a lot of pressure to the area.  Patient states that he has no pain and no new concerns at this time.  He has been applying the antibiotic cream with some modest improvement.    Past Medical History:  Diagnosis Date  . Allergic rhinitis   . Aortic dissection ([place])    f/u by cards - AAA with distal dissection.  [name] CAD (coronary artery disease)    a. s/p MI 1992. b. Prior hx stenting to RCA/Cx. b. 05/2013: [place] s/p DES to distal RCA then staged DES to prox LAD.   [name] COPD with asthma ([place])   . Diabetes mellitus   . Hyperlipidemia   . Lichen planus    Right midline inferior chest, Dr. [name] Sensing  . SCC (squamous cell carcinoma)   . Shingles       Objective/Physical Exam General: The patient is alert and oriented x3 in no acute distress.  Dermatology:  Wound #1 noted to the 3.0 x 1.5 x 0.3 cm (LxWxD).  There appears to be some deterioration of the wound since last visit. To the noted ulceration(s), there is no eschar. There is a moderate amount of slough, fibrin, and necrotic tissue noted. Granulation tissue and wound base is red. There is a minimal amount of serosanguineous drainage noted. There is no exposed bone muscle-tendon ligament or joint. There is no malodor. Periwound integrity is intact.  Vascular: Palpable pedal pulses bilaterally. No edema or erythema noted. Capillary refill within normal limits.  Neurological: Epicritic and protective threshold diminished bilaterally.   Musculoskeletal Exam: Range of motion within normal limits to all pedal and ankle joints bilateral. Muscle strength 5/5 in all groups bilateral. Plantarflexed first ray.   Assessment: 1.  Ulcer subfirst MTPJ right foot secondary to diabetes mellitus  Plan of Care:  1. Patient was evaluated.   2. medically necessary excisional  debridement including subcutaneous tissue was performed using a tissue nipper and a chisel blade. Excisional debridement of all the necrotic nonviable tissue down to healthy bleeding viable tissue was performed with post-debridement measurements same as pre-. 3. the wound was cleansed and dry sterile dressing applied. 4.  Continue gentamicin cream daily 5.  Continue offloading dancers pad to the insoles of the shoes 6. Patient is to return to clinic in 4 weeks   [name], DPM [facility]  Dr. [name], DPM    [address], [place] [address]                Office [phone]  Fax [phone]

## 2021-01-22 ENCOUNTER — Ambulatory Visit: Payer: HMO | Attending: Internal Medicine

## 2021-01-22 DIAGNOSIS — Z23 Encounter for immunization: Secondary | ICD-10-CM

## 2021-01-22 NOTE — Progress Notes (Signed)
   Covid-19 Vaccination Clinic  Name:  Phillip Jordan    MRN: [id number] DOB: 1930/03/11  01/22/2021  [name] was observed post Covid-19 immunization for 15 minutes without incident. He was provided with Vaccine Information Sheet and instruction to access the V-Safe system.   [name] was instructed to call 911 with any severe reactions post vaccine: [name] Difficulty breathing  . Swelling of face and throat  . A fast heartbeat  . A bad rash all over body  . Dizziness and weakness   Immunizations Administered    Name Date Dose VIS Date Route   Moderna Covid-19 Booster Vaccine 01/22/2021 10:27 AM 0.25 mL 06/12/2020 Intramuscular   Manufacturer: Moderna   Lot: [id number]   Guin: [id number]

## 2021-01-27 ENCOUNTER — Other Ambulatory Visit (HOSPITAL_BASED_OUTPATIENT_CLINIC_OR_DEPARTMENT_OTHER): Payer: Self-pay

## 2021-01-27 ENCOUNTER — Other Ambulatory Visit: Payer: Self-pay | Admitting: Internal Medicine

## 2021-01-27 MED ORDER — MODERNA COVID-19 VACCINE 100 MCG/0.5ML IM SUSP
INTRAMUSCULAR | 0 refills | Status: DC
  Filled 2021-01-27: qty 0.25, 1d supply, fill #0

## 2021-01-27 NOTE — Telephone Encounter (Signed)
Refill request for Plavix- no longer on med list. Please advise.

## 2021-01-27 NOTE — Telephone Encounter (Signed)
Patient does not need to take Plavix.  Medication denied.  Please let the patient know about it.

## 2021-01-29 ENCOUNTER — Other Ambulatory Visit: Payer: Self-pay

## 2021-01-29 ENCOUNTER — Ambulatory Visit: Payer: HMO | Admitting: Podiatry

## 2021-01-29 DIAGNOSIS — E0843 Diabetes mellitus due to underlying condition with diabetic autonomic (poly)neuropathy: Secondary | ICD-10-CM

## 2021-01-29 DIAGNOSIS — L97512 Non-pressure chronic ulcer of other part of right foot with fat layer exposed: Secondary | ICD-10-CM

## 2021-01-29 NOTE — Progress Notes (Signed)
   Subjective:  87 y.o. male  presenting today for follow-up evaluation of an ulcer to the plantar aspect of the right first MTPJ.  Patient has a very high arch foot that causes a lot of pressure to the area.  Patient states that he recently purchased a new pair of sandals and like to have them checked out.  He is wondering if we need to apply the felt offloading pads to the new sandals that he will be wearing the summer.    Past Medical History:  Diagnosis Date  . Allergic rhinitis   . Aortic dissection ([place])    f/u by cards - AAA with distal dissection.  [name] CAD (coronary artery disease)    a. s/p MI 1992. b. Prior hx stenting to RCA/Cx. b. 05/2013: [place] s/p DES to distal RCA then staged DES to prox LAD.   [name] COPD with asthma ([place])   . Diabetes mellitus   . Hyperlipidemia   . Lichen planus    Right midline inferior chest, Dr. [name] Sensing  . SCC (squamous cell carcinoma)   . Shingles       Objective/Physical Exam General: The patient is alert and oriented x3 in no acute distress.  Dermatology:  Wound #1 noted to the 0.1 x 0.1 x 0.1 cm (LxWxD).  There appears to be significant improvement since last visit.   To the noted ulceration(s), there is no eschar. There is a minimal amount of slough, fibrin, and necrotic tissue noted. Granulation tissue and wound base is red. There is a minimal amount of serosanguineous drainage noted. There is no exposed bone muscle-tendon ligament or joint. There is no malodor. Periwound integrity is intact.  Vascular: Palpable pedal pulses bilaterally. No edema or erythema noted. Capillary refill within normal limits.  Neurological: Epicritic and protective threshold diminished bilaterally.   Musculoskeletal Exam: Range of motion within normal limits to all pedal and ankle joints bilateral. Muscle strength 5/5 in all groups bilateral. Plantarflexed first ray.   Assessment: 1.  Ulcer subfirst MTPJ right foot secondary to diabetes mellitus  Plan of Care:   1. Patient was evaluated.   2. medically necessary excisional debridement including subcutaneous tissue was performed using a tissue nipper and a chisel blade. Excisional debridement of all the necrotic nonviable tissue down to healthy bleeding viable tissue was performed with post-debridement measurements same as pre-. 3. the wound was cleansed and dry sterile dressing applied. 4.  Continue gentamicin cream daily 5.  Continue offloading dancers pad to the insoles of the shoes. New felt padding was applied to the patient sandals that he recently purchased to offload the first MTPJ 6. Patient is to return to clinic in 4 weeks   [name], DPM [facility]  Dr. [name], DPM    [address], [place] [address]                Office [phone]  Fax [phone]

## 2021-02-05 ENCOUNTER — Ambulatory Visit (INDEPENDENT_AMBULATORY_CARE_PROVIDER_SITE_OTHER): Payer: HMO

## 2021-02-05 DIAGNOSIS — I495 Sick sinus syndrome: Secondary | ICD-10-CM | POA: Diagnosis not present

## 2021-02-07 ENCOUNTER — Ambulatory Visit: Payer: HMO | Admitting: Internal Medicine

## 2021-02-07 LAB — CUP PACEART REMOTE DEVICE CHECK
Battery Remaining Longevity: 149 mo
Battery Voltage: 3.18 V
Brady Statistic AP VP Percent: 61.55 %
Brady Statistic AP VS Percent: 0.07 %
Brady Statistic AS VP Percent: 35.49 %
Brady Statistic AS VS Percent: 2.89 %
Brady Statistic RA Percent Paced: 62.63 %
Brady Statistic RV Percent Paced: 97.04 %
Date Time Interrogation Session: 20220614214059
Implantable Lead Implant Date: 20220316
Implantable Lead Implant Date: 20220316
Implantable Lead Location: 753859
Implantable Lead Location: 753860
Implantable Lead Model: 3830
Implantable Lead Model: 5076
Implantable Pulse Generator Implant Date: 20220316
Lead Channel Impedance Value: 380 Ohm
Lead Channel Impedance Value: 513 Ohm
Lead Channel Impedance Value: 532 Ohm
Lead Channel Impedance Value: 570 Ohm
Lead Channel Pacing Threshold Amplitude: 0.625 V
Lead Channel Pacing Threshold Amplitude: 0.625 V
Lead Channel Pacing Threshold Pulse Width: 0.4 ms
Lead Channel Pacing Threshold Pulse Width: 0.4 ms
Lead Channel Sensing Intrinsic Amplitude: 15.375 mV
Lead Channel Sensing Intrinsic Amplitude: 15.375 mV
Lead Channel Sensing Intrinsic Amplitude: 7.375 mV
Lead Channel Sensing Intrinsic Amplitude: 7.375 mV
Lead Channel Setting Pacing Amplitude: 1.5 V
Lead Channel Setting Pacing Amplitude: 2 V
Lead Channel Setting Pacing Pulse Width: 0.4 ms
Lead Channel Setting Sensing Sensitivity: 1.2 mV

## 2021-02-12 ENCOUNTER — Encounter: Payer: Self-pay | Admitting: Internal Medicine

## 2021-02-12 ENCOUNTER — Other Ambulatory Visit: Payer: Self-pay

## 2021-02-12 ENCOUNTER — Ambulatory Visit (INDEPENDENT_AMBULATORY_CARE_PROVIDER_SITE_OTHER): Payer: HMO | Admitting: Internal Medicine

## 2021-02-12 VITALS — BP 138/70 | HR 60 | Ht 69.0 in | Wt 169.0 lb

## 2021-02-12 DIAGNOSIS — I495 Sick sinus syndrome: Secondary | ICD-10-CM

## 2021-02-12 DIAGNOSIS — Z950 Presence of cardiac pacemaker: Secondary | ICD-10-CM | POA: Diagnosis not present

## 2021-02-12 NOTE — Progress Notes (Signed)
HPI Phillip Jordan returns today for ongoing evaluation of tachy-brady syndrome. He is a pleasant 87yo man with a h/o HTN who developed atrial flutter with a controlled VR and underwent DCCV. He was quite bradycardic after and wore a cardiac monitor which demonstrated mostly nocturnal but some daytime pauses due to sinus node dysfunction. He has not had syncope. He denies chest pain but does have generalized fatigue. He used to work out but in covid has not. He denies angina though has has known CAD. He underwent PPM insertion. He feels betterr. He denies chest pain or sob.  No Known Allergies   Current Outpatient Medications  Medication Sig Dispense Refill   albuterol (VENTOLIN HFA) 108 (90 Base) MCG/ACT inhaler Inhale 2 puffs into the lungs every 4 (four) hours as needed for wheezing or shortness of breath. 9 g 5   apixaban (ELIQUIS) 5 MG TABS tablet Take 1 tablet (5 mg total) by mouth 2 (two) times daily. 60 tablet 11   aspirin EC 81 MG tablet Take 81 mg by mouth daily. Swallow whole.     azelastine (ASTELIN) 0.1 % nasal spray Place 2 sprays into both nostrils at bedtime as needed for rhinitis. Use in each nostril as directed 30 mL 3   dextromethorphan-guaiFENesin (MUCINEX DM) 30-600 MG 12hr tablet Take 1 tablet by mouth 2 (two) times daily as needed for cough.     fluticasone (FLONASE) 50 MCG/ACT nasal spray Place 2 sprays into both nostrils daily as needed for allergies or rhinitis.     Fluticasone-Salmeterol (ADVAIR) 250-50 MCG/DOSE AEPB Inhale 1 puff into the lungs 2 (two) times daily.     glucose blood (FREESTYLE TEST STRIPS) test strip Check blood sugar no more than twice daily 200 each 12   isosorbide mononitrate (IMDUR) 30 MG 24 hr tablet Take 1 tablet (30 mg total) by mouth 2 (two) times daily. 180 tablet 2   meclizine (ANTIVERT) 12.5 MG tablet Take 1 tablet (12.5 mg total) 3 (three) times daily as needed by mouth for dizziness. 30 tablet 0   metFORMIN (GLUCOPHAGE) 500 MG tablet  Take 1 tablet (500 mg total) by mouth daily with breakfast. 90 tablet 1   metoprolol tartrate (LOPRESSOR) 25 MG tablet Take 0.5 tablets (12.5 mg total) by mouth daily. 45 tablet 3   Misc. Devices (ACAPELLA) MISC Use as directed 1 each 0   Multiple Vitamin (MULTIVITAMIN WITH MINERALS) TABS tablet Take 1 tablet by mouth daily.     nitroGLYCERIN (NITROSTAT) 0.4 MG SL tablet Place 1 tablet (0.4 mg total) under the tongue every 5 (five) minutes as needed for chest pain (up to 3 doses only.). 25 tablet 3   pravastatin (PRAVACHOL) 40 MG tablet Take 1 tablet (40 mg total) by mouth daily. 90 tablet 1   No current facility-administered medications for this visit.     Past Medical History:  Diagnosis Date   Allergic rhinitis    Aortic dissection ([place])    f/u by cards - AAA with distal dissection.   CAD (coronary artery disease)    a. s/p MI 1992. b. Prior hx stenting to RCA/Cx. b. 05/2013: [place] s/p DES to distal RCA then staged DES to prox LAD.    COPD with asthma ([place])    Diabetes mellitus    Hyperlipidemia    Lichen planus    Right midline inferior chest, Dr. [name] Sensing   SCC (squamous cell carcinoma)    Shingles     ROS:  All systems reviewed and negative except as noted in the HPI.   Past Surgical History:  Procedure Laterality Date   BACK SURGERY  1993   CARDIAC CATHETERIZATION     left, w/ coronary angiography and left ventriculograpy   CARDIOVERSION N/A 09/05/2020   Procedure: CARDIOVERSION;  Surgeon: [name], MD;  Location: [facility] ENDOSCOPY;  Service: Cardiovascular;  Laterality: N/A;   LEFT HEART CATHETERIZATION WITH CORONARY ANGIOGRAM N/A 06/09/2013   Procedure: LEFT HEART CATHETERIZATION WITH CORONARY ANGIOGRAM;  Surgeon: [name], MD;  Location: [facility] CATH LAB;  Service: Cardiovascular;  Laterality: N/A;   PACEMAKER IMPLANT N/A 11/06/2020   Procedure: PACEMAKER IMPLANT;  Surgeon: Evans Lance, MD;  Location: [facility] CV LAB;  Service: Cardiovascular;   Laterality: N/A;   PERCUTANEOUS CORONARY STENT INTERVENTION (PCI-S) N/A 06/12/2013   Procedure: PERCUTANEOUS CORONARY STENT INTERVENTION (PCI-S);  Surgeon: [name], MD;  Location: [facility] CATH LAB;  Service: Cardiovascular;  Laterality: N/A;  prox LAD   PTCA     w/ placement of drugeluting stent in the distal right coronary artery, and distal Cardiologist: [name]     Family History  Problem Relation Age of Onset   Asthma Mother    Heart disease Mother        CHF   Cancer Mother        melanoma   Heart disease Father        MI   Prostate cancer Neg Hx    Colon cancer Neg Hx      Social History   Socioeconomic History   Marital status: Married    Spouse name: Not on file   Number of children: 3   Years of education: Not on file   Highest education level: Not on file  Occupational History   Occupation: retired  Tobacco Use   Smoking status: Former    Packs/day: 1.50    Years: 39.00    Pack years: 58.50    Types: Cigarettes    Quit date: 08/24/1992    Years since quitting: 28.4   Smokeless tobacco: Never  Vaping Use   Vaping Use: Never used  Substance and Sexual Activity   Alcohol use: No   Drug use: No   Sexual activity: Not on file  Other Topics Concern   Not on file  Social History Narrative   Lives w/ wife    3 kids, 25 Gk-GGK   Social Determinants of Health   Financial Resource Strain: Low Risk    Difficulty of Paying Living Expenses: Not very hard  Food Insecurity: Not on file  Transportation Needs: Not on file  Physical Activity: Not on file  Stress: Not on file  Social Connections: Not on file  Intimate Partner Violence: Not on file     There were no vitals taken for this visit.  Physical Exam:  Well appearing NAD HEENT: Unremarkable Neck:  No JVD, no thyromegally Lymphatics:  No adenopathy Back:  No CVA tenderness Lungs:  Clear HEART:  Regular rate rhythm, no murmurs, no rubs, no clicks Abd:  soft, positive bowel  sounds, no organomegally, no rebound, no guarding Ext:  2 plus pulses, no edema, no cyanosis, no clubbing Skin:  No rashes no nodules Neuro:  CN II through XII intact, motor grossly intact  EKG - nsr with AV pacing  DEVICE  Normal device function.  See PaceArt for details.   Assess/Plan:  1. Symptomatic tachy-brady - I have discussed the treatment options. I think  with his advanced age, PPM insertion alone rather than flutter ablation most reasonable. 2. Atrial flutter - I discussed catheter ablation but he has held NSR and would be likely to develop atrial fib if we did a flutter ablation based on his advanced age. 3. Coags - he will continue eliquis. 4. PPM - his medtronic DDD PM is working normally. We will recheck in several months.  Phillip Overlie Bellamy Judson,MD

## 2021-02-12 NOTE — Patient Instructions (Signed)
Medication Instructions:  Your physician recommends that you continue on your current medications as directed. Please refer to the Current Medication list given to you today.  Labwork: None ordered.  Testing/Procedures: None ordered.  Follow-Up: Your physician wants you to follow-up in: one year with Cristopher Peru, MD or one of the following Advanced Practice Providers on your designated Care Team:   Chanetta Marshall, NP Tommye Standard, PA-C Legrand Como "Jonni Sanger" [place], [place]  Remote monitoring is used to monitor your Pacemaker from home. This monitoring reduces the number of office visits required to check your device to one time per year. It allows [place] to keep an eye on the functioning of your device to ensure it is working properly. You are scheduled for a device check from home on 05/07/2021. You may send your transmission at any time that day. If you have a wireless device, the transmission will be sent automatically. After your physician reviews your transmission, you will receive a postcard with your next transmission date.  Any Other Special Instructions Will Be Listed Below (If Applicable).  If you need a refill on your cardiac medications before your next appointment, please call your pharmacy.

## 2021-02-13 ENCOUNTER — Ambulatory Visit (INDEPENDENT_AMBULATORY_CARE_PROVIDER_SITE_OTHER): Payer: HMO | Admitting: Pharmacist

## 2021-02-13 DIAGNOSIS — E785 Hyperlipidemia, unspecified: Secondary | ICD-10-CM

## 2021-02-13 DIAGNOSIS — I2510 Atherosclerotic heart disease of native coronary artery without angina pectoris: Secondary | ICD-10-CM | POA: Diagnosis not present

## 2021-02-13 DIAGNOSIS — I495 Sick sinus syndrome: Secondary | ICD-10-CM

## 2021-02-13 DIAGNOSIS — E11319 Type 2 diabetes mellitus with unspecified diabetic retinopathy without macular edema: Secondary | ICD-10-CM | POA: Diagnosis not present

## 2021-02-13 DIAGNOSIS — J449 Chronic obstructive pulmonary disease, unspecified: Secondary | ICD-10-CM | POA: Diagnosis not present

## 2021-02-13 NOTE — Chronic Care Management (AMB) (Signed)
Chronic Care Management Pharmacy Note  02/13/2021 Name:  Phillip Jordan MRN:  [id number] DOB:  [birth year]  Subjective: Phillip Jordan is an 87 y.o. year old male who is a primary patient of [name], [name], MD.  The CCM team was consulted for assistance with disease management and care coordination needs.    Engaged with patient by telephone for follow up visit in response to provider referral for pharmacy case management and/or care coordination services.   Consent to Services:  The patient was given information about Chronic Care Management services, agreed to services, and gave verbal consent prior to initiation of services.  Please see initial visit note for detailed documentation.   Patient Care Team: Colon Branch, MD as PCP - [name], MD as Consulting Physician (Dermatology) [name], MD as Consulting Physician (Ophthalmology) [name], MD as Consulting Physician (Pulmonary Disease) [name], MD as Consulting Physician (Cardiology) [name], DPM as Consulting Physician (Podiatry) [name], PharmD (Pharmacist)  Recent office visits: 10/22/2020 - PCP (Dr [name]) F/U chronic conditions; DM with neuropathy, HTN, hyperlipidemia, CAD, Afib. No Medicaiton changes. Lab ordered - A1c, lipids, CMP.  BP noted to be elevated at 162/68 but patient reported this morning at home 127/75 with a heart rate of 50s.  He did said that his BP at home varies over time.  No change for now, recommend to call if BP is elevated.   Recent consult visits: 02/12/2021 - Cardio (Dr [name]) F/U tachy-brady syndrome with PPM insertion. Continue anticoagulation. No med changes.  01/03/2021 - Walsh,NP (Pulmonology) F/U COPD.  No medication changes. Follow up with Dr [name] in June. 12/27/2020 Royal Piedra, NP (Pulmonology) COPD exacerbation. Start Azithromycin 250 mg and Prednisone 10 mg. Follow up with Dr. [name] 6-8 weeks. 11/25/2020 ENT. Seen for Cerumen Impaction. Follow up in  3 months 10/30/2020 - Podiatrist (Dr Daylene Katayama). F/U of an ulcer to the plantar aspect of the right first MTPJ. Pt also c/o pain due to onychomycosis of toenail bilateral.  Plan: excisional debridement including subcutaneous tissue was performed. Excisional debridement of all the necrotic nonviable tissue down to healthy bleeding viable tissue was performed. Continue gentamicin cream daily. Continue offloading dancers pad to the insoles of the shoes. Performed mechanical debridement of nails 1-5 bilateral. Return to clinic in 8 weeks 10/11/2020 - Cardio (Dr. [name]) Seen for tachy-brady syndrome. Plan: PPM insertion to be scheduled  Hospital visits: Pacemaker placement 11/06/2020 - no medication changes noted except continue to hold Eliquis until 11/10/2020.  Objective:  Lab Results  Component Value Date   CREATININE 0.92 10/11/2020   CREATININE 0.97 08/29/2020   CREATININE 1.09 06/24/2020    Lab Results  Component Value Date   HGBA1C 6.7 (H) 10/22/2020   Last diabetic Eye exam:  Lab Results  Component Value Date/Time   HMDIABEYEEXA Retinopathy (A) 06/13/2020 12:00 AM    Last diabetic Foot exam:  Lab Results  Component Value Date/Time   HMDIABFOOTEX Done/Abnormal 05/02/2015 12:00 AM        Component Value Date/Time   CHOL 157 10/22/2020 1521   TRIG 158.0 (H) 10/22/2020 1521   TRIG 181 09/06/2009 1602   HDL 49.00 10/22/2020 1521   CHOLHDL 3 10/22/2020 1521   VLDL 31.6 10/22/2020 1521   LDLCALC 77 10/22/2020 1521    Hepatic Function Latest Ref Rng & Units 10/22/2020 12/13/2019 08/09/2019  Total Protein 6.0 - 8.3 g/dL - 6.8 6.8  Albumin 3.5 - 5.2 g/dL - 4.3  4.4  AST 0 - 37 U/L _0 ALT 0 - 53 U/L _1 Alk Phosphatase 39 - 117 U/L - 90 90  Total Bilirubin 0.2 - 1.2 mg/dL - 0.4 0.4  Bilirubin, Direct 0.0 - 0.3 mg/dL - - -    Lab Results  Component Value Date/Time   TSH 1.96 06/19/2020 01:43 PM   TSH 1.87 02/01/2017 11:40 AM    CBC Latest Ref Rng & Units  10/11/2020 08/29/2020 06/19/2020  WBC 3.4 - 10.8 x10E3/uL 10.3 11.6(H) 12.0(H)  Hemoglobin 13.0 - 17.7 g/dL 15.2 15.9 15.0  Hematocrit 37.5 - 51.0 % 44.1 46.5 43.8  Platelets 150 - 450 x10E3/uL 282 278 292    Lab Results  Component Value Date/Time   VD25OH 44.39 12/12/2015 04:15 PM    Clinical ASCVD: Yes  The ASCVD Risk score [name] DC Jr., et al., 2013) failed to calculate for the following reasons:   The 2013 ASCVD risk score is only valid for ages 59 to 70    Other: CHADS2VASc = 5  Social History   Tobacco Use  Smoking Status Former   Packs/day: 1.50   Years: 39.00   Pack years: 58.50   Types: Cigarettes   Quit date: 08/24/1992   Years since quitting: 28.4  Smokeless Tobacco Never   BP Readings from Last 3 Encounters:  02/12/21 138/70  01/03/21 124/68  12/27/20 138/60   Pulse Readings from Last 3 Encounters:  02/12/21 60  01/03/21 71  12/27/20 75   Wt Readings from Last 3 Encounters:  02/12/21 169 lb (76.7 kg)  01/03/21 169 lb (76.7 kg)  12/27/20 171 lb (77.6 kg)    Assessment: Review of patient past medical history, allergies, medications, health status, including review of consultants reports, laboratory and other test data, was performed as part of comprehensive evaluation and provision of chronic care management services.   SDOH:  (Social Determinants of Health) assessments and interventions performed:  SDOH Interventions    Flowsheet Row Most Recent Value  SDOH Interventions   Physical Activity Interventions Intervention Not Indicated       CCM Care Plan  No Known Allergies  Medications Reviewed Today     Reviewed by [name], PharmD (Pharmacist) on 02/13/21 at 1117  Med List Status: <None>   Medication Order Taking? Sig Documenting Provider Last Dose Status Informant  albuterol (VENTOLIN HFA) 108 (90 Base) MCG/ACT inhaler [id number] Yes Inhale 2 puffs into the lungs every 4 (four) hours as needed for wheezing or shortness of breath. [name], MD Taking Active Self  apixaban (ELIQUIS) 5 MG TABS tablet [id number] Yes Take 1 tablet (5 mg total) by mouth 2 (two) times daily. [name], MD Taking Active Self  aspirin EC 81 MG tablet [id number] Yes Take 81 mg by mouth daily. Swallow whole. [provider] Taking Active Self  azelastine (ASTELIN) 0.1 % nasal spray [id number] Yes Place 2 sprays into both nostrils at bedtime as needed for rhinitis. Use in each nostril as directed Colon Branch, MD Taking Active Self  dextromethorphan-guaiFENesin [facility] DM) 30-600 MG 12hr tablet [id number] Yes Take 1 tablet by mouth 2 (two) times daily as needed for cough. [provider] Taking Active   fluticasone (FLONASE) 50 MCG/ACT nasal spray [id number] Yes Place 2 sprays into both nostrils daily as needed for allergies or rhinitis. [provider] Taking Active Self  Fluticasone-Salmeterol (ADVAIR) 250-50 MCG/DOSE AEPB [id number] Yes Inhale 1 puff into the lungs 2 (two)  times daily. [provider] Taking Active Self  glucose blood (FREESTYLE TEST STRIPS) test strip [id number] Yes Check blood sugar no more than twice daily Colon Branch, MD Taking Active Self  isosorbide mononitrate (IMDUR) 30 MG 24 hr tablet [id number] Yes Take 1 tablet (30 mg total) by mouth 2 (two) times daily. Colon Branch, MD Taking Active Self  meclizine (ANTIVERT) 12.5 MG tablet [id number] No Take 1 tablet (12.5 mg total) 3 (three) times daily as needed by mouth for dizziness.  Patient not taking: Reported on 02/13/2021   Colon Branch, MD Not Taking Active            Med Note Juventino Slovak   Mon Jul 29, 2020 10:17 AM)    metFORMIN (GLUCOPHAGE) 500 MG tablet [id number] Yes Take 1 tablet (500 mg total) by mouth daily with breakfast. Colon Branch, MD Taking Active   metoprolol tartrate (LOPRESSOR) 25 MG tablet [id number] Yes Take 0.5 tablets (12.5 mg total) by mouth daily. [name], MD Taking Active Self  Misc. Devices [facility]) [place] [id number]  Yes Use as directed [name], MD Taking Active   Multiple Vitamin (MULTIVITAMIN WITH MINERALS) TABS tablet [id number] Yes Take 1 tablet by mouth daily. [provider] Taking Active Self  nitroGLYCERIN (NITROSTAT) 0.4 MG SL tablet [id number] Yes Place 1 tablet (0.4 mg total) under the tongue every 5 (five) minutes as needed for chest pain (up to 3 doses only.). [name], MD Taking Active Self           Med Note Lia Hopping, Toniann Fail   Mon Jul 29, 2020 10:17 AM)    pravastatin (PRAVACHOL) 40 MG tablet [id number] Yes Take 1 tablet (40 mg total) by mouth daily. Colon Branch, MD Taking Active             Patient Active Problem List   Diagnosis Date Noted   Pacemaker 02/12/2021   Opacities of both lungs present on chest x-ray 01/06/2021   Tachycardia-bradycardia syndrome ([place]) 10/11/2020   SCC (squamous cell carcinoma) 10/16/2019   Diabetic neuropathy ([place]) 05/23/2019   Hypertension, essential 09/01/2018   COPD exacerbation ([place]) 07/13/2016   Sinusitis, chronic 11/08/2015   Follow-up --------------PCP NOTES 05/02/2015   Vertigo 06/26/2014   Palpitations 08/29/2013   Hoarseness of voice 06/17/2011   Annual physical exam 05/26/2011   BUNDLE BRANCH BLOCK, RIGHT 01/10/2009   Allergic rhinitis 08/16/2007   Type 2 diabetes, controlled, with retinopathy ([place]) 06/30/2007   Hyperlipidemia 06/30/2007   CAD (coronary artery disease) 06/30/2007   Dissection of aorta ([place]) 06/30/2007   COPD GOLD III 06/30/2007    Immunization History  Administered Date(s) Administered   Fluad Quad(high Dose 65+) 05/11/2019   Influenza Split 05/26/2011, 06/01/2012   Influenza Whole 06/30/2007, 06/18/2008, 06/14/2009, 04/24/2010   Influenza, High Dose Seasonal PF 06/19/2014, 05/02/2015, 06/08/2017, 05/16/2018   Influenza,inj,Quad PF,6+ Mos 06/10/2013   Influenza-Unspecified 05/21/2016, 05/16/2020   Moderna SARS-COV2 Booster Vaccination 06/24/2020, 01/22/2021   Moderna Sars-Covid-2 Vaccination  09/05/2019, 10/03/2019   Pneumococcal Conjugate-13 09/10/2015   Pneumococcal Polysaccharide-23 08/24/2001, 06/30/2007, 06/01/2012   Td 08/24/2005, 07/06/2016   Tdap 09/12/2005   Zoster Recombinat (Shingrix) 09/01/2018, 11/01/2018   Zoster, Live 10/13/2007    Conditions to be addressed/monitored: CAD, HTN, HLD, COPD, DMII, and tachy-brady syndrome; atrial flutter  Care Plan : General Pharmacy (Adult)  Updates made by [name], PHARMD since 02/13/2021 12:00 AM     Problem: HTN; HDL; atrial flutter; COPD; type 2 DM  Priority: High  Onset Date: 02/13/2021     Long-Range Goal: Disease and medication management   Start Date: 02/13/2021  This Visit's Progress: On track  Priority: High  Note:   Current Barriers:  Disease state and medication management to include education and care coordination  Pharmacist Clinical Goal(s):  Over the next 180 days, patient will maintain control of HTN, HDL, COPD and type 2 DM as evidenced by maintaining goals listed below  adhere to prescribed medication regimen as evidenced by fill history  through collaboration with PharmD and provider.   Interventions: 1:1 collaboration with Colon Branch, MD regarding development and update of comprehensive plan of care as evidenced by provider attestation and co-signature Inter-disciplinary care team collaboration (see longitudinal plan of care) Comprehensive medication review performed; medication list updated in electronic medical record  Hypertension Goal BP <130/80 Controlled Recent home BP readings: 128/77; 132/80; 118/72; 120/70 Current regimen:  Metoprolol tartrate 22m = take 0.5 tablet (= 12.557m daily Interventions: Discussed blood pressure goals  Continue to check blood pressure 1 to 2 times per week. Record and bring to next office appointment.  Ensure daily salt intake < 2300 mg/day Patient is doing well currently but in future could consider change to metoprolol succinate 2523m.5 tablet  daily.  Hyperlipidemia/CAD Last LDL was slightly above goal at 77; LDL goal < 70 Patient denies recent CP or dizziness Current regimen:  Pravastatin 66m18mily Aspirin 81mg53mly Isosorbide 30mg 42me daily Nitroglycerin 0.4mg as87meded Interventions: Discussed LDL goal Recommended continue current regimen. If next LDL >70 consider increasing pravastatin to 80mg da6m Diabetes Controlled; A1c goal <7% Home BG readings range from 90's to 120's Denies hypoglycemia Exercised - walks daily about 30 minutes (using cane when walking for stabilization) Diet - eats lots of vegetables; does limit sugar intake.  Current regimen:  Metformin 500mg dai58mnterventions: Discussed DM goals Continue to check BG daily and record Continue to walk daily   COPD (goal to decrease symptoms and exacerbations of COPD): 1 exacerbation in last year (12/27/2020) Using maintenance inhaler daily Reports rarely uses albuterol - maybe 1 time per week.  Current regimen:  Advair 250/50mcg - i50me 1 puff into lungs twice a day Albuterol inhaler - inhaler 2 puffs into lungs up to every 4 hours as needed for wheezing and shortness of breath Acapella device Interventions: Discussed cost of inhalers. Patient reports [facility] [place] [facility]  Tachy/Brady Syndrome and Atrial Flutter  Goal: reduce stroke risk Managed by Dr Taylor - cLovena Leogist Denies S/S of bleeding Current regimen: Eliquis 5mg twice 82mly Pacemaker placed 11/06/2020 Metoprolol tartrate 25mg - take28m tablet (=12.5mg) daily I53mrventions Discussed cost of Eliquis. [facility] [place] Continue current regimen Continue to follow up with cardiologist and device clinic as indicated.   Medication management [facility] but patient is no  longer taking this medication.  Interventions: Comprehensive medication review performed. Continue current medication management strategy Coordinate with [facility] to make sure Rx for clopidogrel was inactivated.    Patient Goals/Self-Care Activities Over the next 180 days, patient will:  take medications as prescribed, check glucose daily , document, and provide at future appointments, check blood pressure 1 to 2 times per week, document, and provide at future appointments, and target a minimum of 150 minutes of moderate intensity exercise weekly  Follow Up Plan:  pharmacy team will outreach in 3 months; Clinical pharmacist will f/u again in 6 months unless needed sooner.         Medication Assistance: None required.  Patient affirms current coverage meets needs.  Patient's preferred pharmacy is:  [facility], [place] [address] Phone: [phone] Fax: [phone]  Follow Up:  Patient agrees to Care Plan and Follow-up.  Plan:  Pharmacy Team CMA will f/u by phone in 3 months; Clinical pharmacist f/u in 6 months.   [name], PharmD Clinical Pharmacist [facility] [phone]

## 2021-02-13 NOTE — Patient Instructions (Signed)
Visit Information  PATIENT GOALS:  Goals Addressed             This Visit's Progress    Chronic Care Management Pharmacy Care Plan   On track    CARE PLAN ENTRY (see longitudinal plan of care for additional care plan information)  Current Barriers:  Chronic Disease Management support, education, and care coordination needs related to COPD, DM, HTN, HLD, CAD, Vertigo  Hypertension BP Readings from Last 3 Encounters:  02/12/21 138/70  01/03/21 124/68  12/27/20 138/60  Pharmacist Clinical Goal(s): Over the next 180 days, patient will work with PharmD and providers to maintain BP goal <130/80 Current regimen:  Metoprolol tartrate 25mg  = take 0.5 tablet (= 12.5mg ) daily Interventions: Discussed blood pressure goals  Patient self care activities - Over the next 180 days, patient will: Check blood pressure 1 to 2 times per week. Record and bring to next office appointment.  Ensure daily salt intake < 2300 mg/day  Hyperlipidemia/CAD Lab Results  Component Value Date/Time   LDLCALC 77 10/22/2020 03:21 PM  Pharmacist Clinical Goal(s): Over the next 180 days, patient will work with PharmD and providers to achieve LDL goal < 70 Current regimen:  Pravastatin 40mg  daily Aspirin 81mg  daily Isosorbide 30mg  twice daily Nitroglycerin 0.4mg  as needed Interventions: Discussed LDL goal Patient self care activities - Over the next 180 days, patient will: Continue current regimen.   Diabetes Lab Results  Component Value Date/Time   HGBA1C 6.7 (H) 10/22/2020 03:21 PM   HGBA1C 6.5 (H) 06/19/2020 01:43 PM  Pharmacist Clinical Goal(s): Over the next 180 days, patient will work with PharmD and providers to maintain A1c goal <7% Current regimen:  Metformin 500mg  daily Interventions: Discussed DM goals Patient self care activities - Over the next 180 days, patient will: Maintain a1c less than 7%  COPD Pharmacist Clinical Goal(s): Over the next 180 days, patient will work with PharmD  and providers to prevent exacerbation of COPD Current regimen:  Advair 250/73mcg - inhale 1 puff into lungs twice a day Albuterol inhaler - inhaler 2 puffs into lungs up to every 4 hours as needed for wheezing and shortness of breath Acapella device Interventions Discussed cost of inhalers. Patient reports affordable since he has started to get from [place] Patient self care activities - Over the next 180 days, patient will: Continue current medication regimen for COPD  Tachy/Brady Syndrome and Atrial Flutter Pharmacist Clinical Goal(s): Over the next 180 days, patient will work with PharmD and providers to continue stroke risk reduction Current regimen: Eliquis 5mg  twice daily Pacemaker placed 11/06/2020 Metoprolol tartrate 25mg  - take 0.5 tablet (=12.5mg ) daily Interventions Discussed cost of Eliquis. Patient is getting thru [place] and cost is $8/month Patient self care activities - Over the next 180 days, patient will: Continue current regimen Continue to follow up with cardiologist and device clinic as indicated.   Medication management Pharmacist Clinical Goal(s): Over the next 180 days, patient will work with PharmD and providers to maintain optimal medication adherence Current pharmacy: [facility] [place] Interventions Comprehensive medication review performed. Continue current medication management strategy Called [facility] and requested clopidogrel prescription be inactivates since patient is no longer taking.  Patient self care activities - Over the next 180 days, patient will: Focus on medication adherence by filling and taking medications appropriately Take medications as prescribed Report any questions or concerns to PharmD and/or provider(s)  Please see past updates related to this goal by clicking on the "Past Updates" button in the selected  goal            The patient verbalized understanding of instructions, educational materials, and care plan  provided today and declined offer to receive copy of patient instructions, educational materials, and care plan.   Pharmacy team [place] will f/u in 3 months; Clinical pharmacist f/u in 6 months  [name], PharmD Clinical Pharmacist [place] [phone]

## 2021-02-21 ENCOUNTER — Other Ambulatory Visit: Payer: Self-pay

## 2021-02-21 ENCOUNTER — Ambulatory Visit (INDEPENDENT_AMBULATORY_CARE_PROVIDER_SITE_OTHER): Payer: HMO | Admitting: Internal Medicine

## 2021-02-21 VITALS — BP 154/92 | HR 70 | Temp 97.5°F | Resp 18 | Ht 69.0 in | Wt 170.0 lb

## 2021-02-21 DIAGNOSIS — E1142 Type 2 diabetes mellitus with diabetic polyneuropathy: Secondary | ICD-10-CM | POA: Diagnosis not present

## 2021-02-21 DIAGNOSIS — I10 Essential (primary) hypertension: Secondary | ICD-10-CM

## 2021-02-21 DIAGNOSIS — W19XXXA Unspecified fall, initial encounter: Secondary | ICD-10-CM

## 2021-02-21 DIAGNOSIS — E11319 Type 2 diabetes mellitus with unspecified diabetic retinopathy without macular edema: Secondary | ICD-10-CM

## 2021-02-21 DIAGNOSIS — Z7185 Encounter for immunization safety counseling: Secondary | ICD-10-CM | POA: Diagnosis not present

## 2021-02-21 LAB — BASIC METABOLIC PANEL
BUN: 16 mg/dL (ref 6–23)
CO2: 30 mEq/L (ref 19–32)
Calcium: 9.6 mg/dL (ref 8.4–10.5)
Chloride: 97 mEq/L (ref 96–112)
Creatinine, Ser: 0.98 mg/dL (ref 0.40–1.50)
GFR: 68.71 mL/min (ref >60.00)
Glucose, Bld: 126 mg/dL — ABNORMAL HIGH (ref 70–99)
Potassium: 4.5 mEq/L (ref 3.5–5.1)
Sodium: 135 mEq/L (ref 135–145)

## 2021-02-21 LAB — HEMOGLOBIN A1C: Hgb A1c MFr Bld: 6.7 % — ABNORMAL HIGH (ref 4.6–6.5)

## 2021-02-21 NOTE — Progress Notes (Signed)
Subjective:    Patient ID: Phillip Jordan, male    DOB: [birth year], 87 y.o.   MRN: [id number]  DOS:  02/21/2021 Type of visit - description: F/U  Here for follow-up Today with talk about high blood pressure, diabetes, falls. He had a fall 2 weeks ago, he was getting up from bed at night and fell. No major injury, no headache or neck pain.  Also, cardiology and pulmonary notes reviewed  BP Readings from Last 3 Encounters:  02/21/21 (!) 154/92  02/12/21 138/70  01/03/21 124/68    Review of Systems No fever chills No chest pain or difficulty breathing at this point. He does have some cough, mostly dry, occasionally see clear sputum. No blood in the stools or in the urine   Past Medical History:  Diagnosis Date   Allergic rhinitis    Aortic dissection ([place])    f/u by cards - AAA with distal dissection.   CAD (coronary artery disease)    a. s/p MI 1992. b. Prior hx stenting to RCA/Cx. b. 05/2013: [place] s/p DES to distal RCA then staged DES to prox LAD.    COPD with asthma ([place])    Diabetes mellitus    Hyperlipidemia    Lichen planus    Right midline inferior chest, Dr. [name] Sensing   SCC (squamous cell carcinoma)    Shingles     Past Surgical History:  Procedure Laterality Date   BACK SURGERY  1993   CARDIAC CATHETERIZATION     left, w/ coronary angiography and left ventriculograpy   CARDIOVERSION N/A 09/05/2020   Procedure: CARDIOVERSION;  Surgeon: [name], MD;  Location: [facility] ENDOSCOPY;  Service: Cardiovascular;  Laterality: N/A;   LEFT HEART CATHETERIZATION WITH CORONARY ANGIOGRAM N/A 06/09/2013   Procedure: LEFT HEART CATHETERIZATION WITH CORONARY ANGIOGRAM;  Surgeon: [name], MD;  Location: [facility] CATH LAB;  Service: Cardiovascular;  Laterality: N/A;   PACEMAKER IMPLANT N/A 11/06/2020   Procedure: PACEMAKER IMPLANT;  Surgeon: Evans Lance, MD;  Location: [place] CV LAB;  Service: Cardiovascular;  Laterality: N/A;   PERCUTANEOUS CORONARY STENT  INTERVENTION (PCI-S) N/A 06/12/2013   Procedure: PERCUTANEOUS CORONARY STENT INTERVENTION (PCI-S);  Surgeon: [name], MD;  Location: [facility] CATH LAB;  Service: Cardiovascular;  Laterality: N/A;  prox LAD   PTCA     w/ placement of drugeluting stent in the distal right coronary artery, and distal Cardiologist: Mark W.Pulsipher    Allergies as of 02/21/2021   No Known Allergies      Medication List        Accurate as of February 21, 2021 11:59 PM. If you have any questions, ask your nurse or doctor.          STOP taking these medications    meclizine 12.5 MG tablet Commonly known as: ANTIVERT Stopped by: Kathlene November, MD       TAKE these medications    Acapella Misc Use as directed   albuterol 108 (90 Base) MCG/ACT inhaler Commonly known as: VENTOLIN HFA Inhale 2 puffs into the lungs every 4 (four) hours as needed for wheezing or shortness of breath.   apixaban 5 MG Tabs tablet Commonly known as: Eliquis Take 1 tablet (5 mg total) by mouth 2 (two) times daily.   aspirin EC 81 MG tablet Take 81 mg by mouth daily. Swallow whole.   azelastine 0.1 % nasal spray Commonly known as: ASTELIN Place 2 sprays into both nostrils at bedtime as needed for rhinitis.  Use in each nostril as directed   dextromethorphan-guaiFENesin 30-600 MG 12hr tablet Commonly known as: MUCINEX DM Take 1 tablet by mouth 2 (two) times daily as needed for cough.   fluticasone 50 MCG/ACT nasal spray Commonly known as: FLONASE Place 2 sprays into both nostrils daily as needed for allergies or rhinitis.   Fluticasone-Salmeterol 250-50 MCG/DOSE Aepb Commonly known as: ADVAIR Inhale 1 puff into the lungs 2 (two) times daily.   FREESTYLE TEST STRIPS test strip Generic drug: glucose blood Check blood sugar no more than twice daily   isosorbide mononitrate 30 MG 24 hr tablet Commonly known as: IMDUR Take 1 tablet (30 mg total) by mouth 2 (two) times daily.   metFORMIN 500 MG tablet Commonly  known as: GLUCOPHAGE Take 1 tablet (500 mg total) by mouth daily with breakfast.   metoprolol tartrate 25 MG tablet Commonly known as: LOPRESSOR Take 0.5 tablets (12.5 mg total) by mouth daily.   multivitamin with minerals Tabs tablet Take 1 tablet by mouth daily.   nitroGLYCERIN 0.4 MG SL tablet Commonly known as: NITROSTAT Place 1 tablet (0.4 mg total) under the tongue every 5 (five) minutes as needed for chest pain (up to 3 doses only.).   pravastatin 40 MG tablet Commonly known as: PRAVACHOL Take 1 tablet (40 mg total) by mouth daily.           Objective:   Physical Exam BP (!) 154/92 (BP Location: Right Arm, Patient Position: Sitting, Cuff Size: Normal)   Pulse 70   Temp (!) 97.5 F (36.4 C)   Resp 18   Ht 5\' 9"  (1.753 m)   Wt 170 lb (77.1 kg)   SpO2 95%   BMI 25.10 kg/m  General:   Well developed, NAD, BMI noted. HEENT:  Normocephalic . Face symmetric, atraumatic Lungs:  Decreased breath sounds at bases with some dry crackles. Normal respiratory effort, no intercostal retractions, no accessory muscle use. Heart: RRR,  no murmur.  Lower extremities: no pretibial edema bilaterally  Skin: Not pale. Not jaundice Neurologic:  alert & oriented X3.  Speech normal, gait: Assisted by a cane.  Transferring is a slow, needed some help. Psych--  Cognition and judgment appear intact.  Cooperative with normal attention span and concentration.  Behavior appropriate. No anxious or depressed appearing.      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 Ronnald Ramp Chronic hoarseness, status post ENT eval ~ 2012 Normal DEXA 2012 HOH H/o Shingles  PLAN: DM:  Currently on metformin, check A1c Neuropathy: Ongoing symptoms, he ask about options, we could do a trial with gabapentin or refer to neurology.  Pt elected to "wait-and-see". HTN: BP today slightly elevated but typically okay, for now continue Imdur, metoprolol.  Check BMP A flutter, tachycardia/bradycardia: Since the last visit, has a pacemaker implant 11/06/2020. Saw cardiology 02/12/2021, felt to be stable.  He is anticoagulated w/o clinical bleeding. COPD, asthma, abnormal chest x-ray Patient was treated for suspected pneumonia 12/27/2020, improved clinically, last chest x-ray not completely normal, will see Dr. [name] in few days. Fall: Had a fall 2 weeks ago, no major injury, since then he is using a cane.  Several pointers provided regards fall prevention.  Offered physical therapy, declined  it for now.  See AVS Preventive care: Had 4 COVID shot, recommend a flu shot. RTC 5 months  This visit occurred during the SARS-CoV-2 public health emergency.  Safety protocols were in place, including screening questions prior to the visit, additional usage of staff PPE, and extensive cleaning of exam room while observing appropriate contact time as indicated for disinfecting solutions.

## 2021-02-21 NOTE — Patient Instructions (Addendum)
Recommend to get a flu shot this fall  GO TO THE LAB : Get the blood work     [place], [place] back for a checkup in 4 to 5 months  Fall Prevention in the Home, Adult Falls can cause injuries and can affect people from all age groups. There are many simple things that you can do to make your home safe and to help preventfalls. Ask for help when making these changes, if needed. What actions can I take to prevent falls? General instructions Use good lighting in all rooms. Replace any light bulbs that burn out. Turn on lights if it is dark. Use night-lights. Place frequently used items in easy-to-reach places. Lower the shelves around your home if necessary. Set up furniture so that there are clear paths around it. Avoid moving your furniture around. Remove throw rugs and other tripping hazards from the floor. Avoid walking on wet floors. Fix any uneven floor surfaces. Add color or contrast paint or tape to grab bars and handrails in your home. Place contrasting color strips on the first and last steps of stairways. When you use a stepladder, make sure that it is completely opened and that the sides are firmly locked. Have someone hold the ladder while you are using it. Do not climb a closed stepladder. Be aware of any and all pets. What can I do in the bathroom?     Keep the floor dry. Immediately clean up any water that spills onto the floor. Remove soap buildup in the tub or shower on a regular basis. Use non-skid mats or decals on the floor of the tub or shower. Attach bath mats securely with double-sided, non-slip rug tape. If you need to sit down while you are in the shower, use a plastic, non-slip stool. Install grab bars by the toilet and in the tub and shower. Do not use towel bars as grab bars. What can I do in the bedroom? Make sure that a bedside light is easy to reach. Do not use oversized bedding that drapes onto the  floor. Have a firm chair that has side arms to use for getting dressed. What can I do in the kitchen? Clean up any spills right away. If you need to reach for something above you, use a sturdy step stool that has a grab bar. Keep electrical cables out of the way. Do not use floor polish or wax that makes floors slippery. If you must use wax, make sure that it is non-skid floor wax. What can I do in the stairways? Do not leave any items on the stairs. Make sure that you have a light switch at the top of the stairs and the bottom of the stairs. Have them installed if you do not have them. Make sure that there are handrails on both sides of the stairs. Fix handrails that are broken or loose. Make sure that handrails are as long as the stairways. Install non-slip stair treads on all stairs in your home. Avoid having throw rugs at the top or bottom of stairways, or secure the rugs with carpet tape to prevent them from moving. Choose a carpet design that does not hide the edge of steps on the stairway. Check any carpeting to make sure that it is firmly attached to the stairs. Fix any carpet that is loose or worn. What can I do on the outside of my home? Use bright outdoor lighting. Regularly repair the edges  of walkways and driveways and fix any cracks. Remove high doorway thresholds. Trim any shrubbery on the main path into your home. Regularly check that handrails are securely fastened and in good repair. Both sides of any steps should have handrails. Install guardrails along the edges of any raised decks or porches. Clear walkways of debris and clutter, including tools and rocks. Have leaves, snow, and ice cleared regularly. Use sand or salt on walkways during winter months. In the garage, clean up any spills right away, including grease or oil spills. What other actions can I take? Wear closed-toe shoes that fit well and support your feet. Wear shoes that have rubber soles or low heels. Use  mobility aids as needed, such as canes, walkers, scooters, and crutches. Review your medicines with your health care provider. Some medicines can cause dizziness or changes in blood pressure, which increase your risk of falling. Talk with your health care provider about other ways that you can decrease your risk of falls. This may include working with a physical therapist or trainer toimprove your strength, balance, and endurance. Where to find more information Centers for Disease Control and Prevention, [employer]: WebmailGuide.co.za [employer] on Aging: BrainJudge.co.uk Contact a health care provider if: You are afraid of falling at home. You feel weak, drowsy, or dizzy at home. You fall at home. Summary There are many simple things that you can do to make your home safe and to help prevent falls. Ways to make your home safe include removing tripping hazards and installing grab bars in the bathroom. Ask for help when making these changes in your home. This information is not intended to replace advice given to you by your health care provider. Make sure you discuss any questions you have with your healthcare provider. Document Revised: 07/23/2017 Document Reviewed: 03/25/2017 Elsevier Patient Education  2021 [employer].

## 2021-02-23 NOTE — Assessment & Plan Note (Signed)
DM: Currently on metformin, check A1c Neuropathy: Ongoing symptoms, he ask about options, we could do a trial with gabapentin or refer to neurology.  Pt elected to "wait-and-see". HTN: BP today slightly elevated but typically okay, for now continue Imdur, metoprolol.  Check BMP A flutter, tachycardia/bradycardia: Since the last visit, has a pacemaker implant 11/06/2020. Saw cardiology 02/12/2021, felt to be stable.  He is anticoagulated w/o clinical bleeding. COPD, asthma, abnormal chest x-ray Patient was treated for suspected pneumonia 12/27/2020, improved clinically, last chest x-ray not completely normal, will see Dr. [name] in few days. Fall: Had a fall 2 weeks ago, no major injury, since then he is using a cane.  Several pointers provided regards fall prevention.  Offered physical therapy, declined it for now.  See AVS Preventive care: Had 4 COVID shot, recommend a flu shot. RTC 5 months

## 2021-02-25 ENCOUNTER — Ambulatory Visit: Payer: HMO | Admitting: Internal Medicine

## 2021-02-25 ENCOUNTER — Encounter: Payer: Self-pay | Admitting: Internal Medicine

## 2021-02-25 ENCOUNTER — Other Ambulatory Visit: Payer: Self-pay

## 2021-02-25 DIAGNOSIS — R490 Dysphonia: Secondary | ICD-10-CM | POA: Diagnosis not present

## 2021-02-25 DIAGNOSIS — J449 Chronic obstructive pulmonary disease, unspecified: Secondary | ICD-10-CM

## 2021-02-25 MED ORDER — ALBUTEROL SULFATE HFA 108 (90 BASE) MCG/ACT IN AERS: INHALATION_SPRAY | RESPIRATORY_TRACT | 11 refills | Status: DC

## 2021-02-25 MED ORDER — STIOLTO RESPIMAT 2.5-2.5 MCG/ACT IN AERS: 2.0000 | INHALATION_SPRAY | Freq: Every day | RESPIRATORY_TRACT | 0 refills | Status: DC

## 2021-02-25 MED ORDER — STIOLTO RESPIMAT 2.5-2.5 MCG/ACT IN AERS: 2.0000 | INHALATION_SPRAY | Freq: Every day | RESPIRATORY_TRACT | 11 refills | Status: AC

## 2021-02-25 MED ORDER — STIOLTO RESPIMAT 2.5-2.5 MCG/ACT IN AERS: 2.0000 | INHALATION_SPRAY | Freq: Every day | RESPIRATORY_TRACT | 11 refills | Status: DC

## 2021-02-25 NOTE — Assessment & Plan Note (Signed)
Quit smoking in 1994   - PFT's 07/02/11  FEV1  1.41 (55%) ratio 66 and 12% better p B2 with DLCO 96% - PFTs  07/05/2013 FEV1  1.05 and 1.31 p B2 (24%) with DLCO 59 corrects to 84%  - Spiriva and other mdi result in hoarseness - hfa 05/25/2013 75% p coaching>  90% 07/05/13  - added gerd rx 05/25/2013 >> improved 07/05/13  - rx dulera 200 2bid 07/05/13 > better 10/03/2013   - 10/08/2015 added GERD rx back due to pm strangling/ hoarseness  - 10/25/2015 added flutter - Rehab completed Aug 2017  - PFT's  05/21/2016  FEV1 1.23 (50 % ) ratio 45  p 32 % improvement from saba p dulera 200 prior to study with DLCO  56/57 % corrects to 72  % for alv volume    - added pred x 6 days as backup plan prn as of 03/12/2017   - 02/25/2021  After extensive coaching inhaler device,  effectiveness =    75% with smi > try stiolto 2 pffs each am x 4 week sample to see if hoareness better   Pt is Group B in terms of symptom/risk and laba/lama therefore appropriate rx at this point >>>  Try stiolto and prn saba  Re saba: I spent extra time with pt today reviewing appropriate use of albuterol for prn use on exertion with the following points: 1) saba is for relief of sob that does not improve by walking a slower pace or resting but rather if the pt does not improve after trying this first. 2) If the pt is convinced, as many are, that saba helps recover from activity faster then it's easy to tell if this is the case by re-challenging : ie stop, take the inhaler, then p 5 minutes try the exact same activity (intensity of workload) that just caused the symptoms and see if they are substantially diminished or not after saba 3) if there is an activity that reproducibly causes the symptoms, try the saba 15 min before the activity on alternate days   If in fact the saba really does help, then fine to continue to use it prn but advised may need to look closer at the maintenance regimen being used to achieve better control of airways  disease with exertion.          Each maintenance medication was reviewed in detail including emphasizing most importantly the difference between maintenance and prns and under what circumstances the prns are to be triggered using an action plan format where appropriate.  Total time for H and P, chart review, counseling, reviewing smi/ hfa  device(s) and generating customized AVS unique to this office visit / same day charting = 34 min

## 2021-02-25 NOTE — Progress Notes (Signed)
Subjective:     Patient ID: Phillip Jordan, male    DOB: 06-Mar-1930   MRN: [id number]    Brief patient profile:  87   year old white  male, ex-smoker (1ppd x 30 years, quit 1990s), diagnosed with COPD per hx by Dr [name] (fev1 1.38L/44%, ratio 51 in Jan 2009). C/w GOLD III  Criteria    History of Present Illness  05/25/2013 consult/ [name] re: sob/ prev eval MR with GOLD III COPD Chief Complaint  Patient presents with   Pulmonary Consult    Referred per Dr. Kathlene November. The pt c/o DOE for the past 15 years, but worse for the past 6 months. He states that he gets SOB with walking up hills, but does okay with his exercise program 2-3 times per wk. He also c/o hoarseness and cough "for a long time"- cough is prod in the am with minimal clear sputum.      All his days are about the same in terms of activity tolerance  Breathing got better p quit smoking then worse again starting around mid 1990s Better on dulera, no better on spiriva ? Worse hoarseness so stopped it Work on inhaler technique:    Pantoprazole (protonix) 40 mg   Take 30-60 min before first meal of the day and Pepcid 20 mg one bedtime until return to office - this is the best way to tell whether stomach acid is contributing to your problem.  GERD diet    03/12/2017  f/u ov/Phillip Jordan re:   COPD  III dulera 200 / singulair/ gerd rx  Chief Complaint  Patient presents with   Follow-up    Breathing has improved back to his normal baseline. No new co's. He has not had to use rescue inhaler.    doe still = mmrc3  rec Plan A = Automatic = Dulera 200 Take 2 puffs first thing in am and then another 2 puffs about 12 hours later Also  Pantoprazole (protonix) 40 mg   Take  30-60 min before first meal of the day and Pepcid (famotidine)  20 mg one @  bedtime until return to office - this is the best way to tell whether stomach acid is contributing to your problem.   Plan B = Backup Only use your albuterol as a rescue medication to be used if  you can't catch your breath by resting or doing a relaxed purse lip breathing pattern.  - The less you use it, the better it will work when you need it. - Ok to use the inhaler up to 2 puffs  every 4 hours if you must but call for appointment if use goes up over your usual need - Don't leave home without it !!  (think of it like the spare tire for your car)  Whenever coughing / wheezing/ short of breath >  Take protonix Take 30- 60 min before your first and last meals of the day and once you are better reduce back to protonix 40 mg Take 30-60 min before first meal of the day  Plan C= Crisis If needing the albuterol (proair) more than a few times a week> Prednisone 10 mg take  4 each am x 2 days,   2 each am x 2 days,  1 each am x 2 days and stop     06/14/2017  f/u ov/Phillip Jordan re: copd III on dulera 200/ singulair/ gerd rx/ pred prn used x one cycle since last ov Chief Complaint  Patient presents  with   Follow-up    He states his breathing is doing well. He rarely uses his albuterol inhaler.    doe improved to  Phillip Jordan = can't walk a nl pace on a flat grade s sob but does fine slow and flat eg does fine walking slow anywhere and also [facility]  Sleeps ok  Watery rhinitis worse p meals / not sure singulair helping rec Ok to try off singulair to see if worse nasal or lower respiratory symptoms and if so restart     03/17/2019  f/u ov/Phillip Jordan re: copd II/ maint on dulera 200 and pepcid hs for ? Noct gerd  Chief Complaint  Patient presents with   Follow-up    Breathing is overall doing well but he is not exercising much. He uses his rescue inhaler about once every other wk.   Dyspnea:  Gym closed / some walking in the street, did not do  as well [place] but still did 45 min s stopping, slow pace = MMRC2 = can't walk a nl pace on a flat grade s sob but does fine slow and flat  Cough: no change am cough/ congesiton >min mucoid sputum Sleeping: bed flat/ one pillow  SABA use:  rare  rec Ok to try off the famotidine to see if it makes any difference with your congestion in am after you try  the bed blocks for a month   GERD diet   05/11/2019  f/u ov/Phillip Jordan re:  Copd GOLD II/ maint on dulera 200   doing better noct and in am since bed blocks and pepcid at hs (never stopped)  Chief Complaint  Patient presents with   Follow-up    had round of pred 2 wks ago- SOB and hoarseness has improved. He is using bed blocks and this has helped.    Dyspnea:  MMRC2 = can't walk a nl pace on a flat grade s sob but does fine slow and flat  Cough: better, less congestion esp in am  Sleeping: ok on bed blocks  SABA use: rarely saba  02: no rec Stop pepcid / continue bed blocks   televist  08/28/19 rec pred x 6 days and off   12/04/2019  f/u ov/Phillip Jordan re:  GOLD II/ maint dulera 200  Chief Complaint  Patient presents with   Follow-up    Breathing is "not bad"- wants to discuss switching from [facility] to Symbicort due to cost. He uses his albuterol inhaler once per wk on average.   Dyspnea:  MMRC2 = can't walk a nl pace on a flat grade s sob but does fine slow and flat / not very active  Cough: none  Sleeping: better, no pepcid hs  SABA use: rarely  02: rarely  rec symbicort 160 = dulera 200 either way dose is  Take 2 puffs first thing in am and then another 2 puffs about 12 hours later.   05/06/2020  f/u ov/Phillip Jordan re: GOLD II/ maint on symbicort 160 2bid per [facility]  Chief Complaint  Patient presents with   Follow-up    "doing okay". reports albuterol use once last week,  Dyspnea:  No change = MMRC 2 but limited by R foot s/p surgery  Cough: rarely/ some hoarseness  Sleeping: bed blocks  SABA use: proair once a week 02: no 02  Rec No change rx   02/25/2021  f/u ov/Phillip Jordan re: copd II / advair and worse hoarsness  Chief Complaint  Patient  presents with   Follow-up    COPD follow up. Patient reports 2-3 months he has some occasional hoarseness.     Dyspnea:  MMRC2 = can't walk a  nl pace on a flat grade s sob but does fine slow and flat  Cough: hoarseness no cough  Sleeping: ok  SABA use: not much  02: none  Covid status:   vax 4    No obvious day to day or daytime variability or assoc excess/ purulent sputum or mucus plugs or hemoptysis or cp or chest tightness, subjective wheeze or overt sinus or hb symptoms.   Sleeping ok  without nocturnal  or early am exacerbation  of respiratory  c/o's or need for noct saba. Also denies any obvious fluctuation of symptoms with weather or environmental changes or other aggravating or alleviating factors except as outlined above   No unusual exposure hx or h/o childhood pna/ asthma or knowledge of premature birth.  Current Allergies, Complete Past Medical History, Past Surgical History, Family History, and Social History were reviewed in [employer] record.  ROS  The following are not active complaints unless bolded Hoarseness, sore throat, dysphagia, dental problems, itching, sneezing,  nasal congestion or discharge of excess mucus or purulent secretions, ear ache,   fever, chills, sweats, unintended wt loss or wt gain, classically pleuritic or exertional cp,  orthopnea pnd or arm/hand swelling  or leg swelling, presyncope, palpitations, abdominal pain, anorexia, nausea, vomiting, diarrhea  or change in bowel habits or change in bladder habits, change in stools or change in urine, dysuria, hematuria,  rash, arthralgias, visual complaints, headache, numbness, weakness or ataxia or problems with walking or coordination,  change in mood or  memory. Light headed if stands up too quickly        Current Meds  Medication Sig   albuterol (VENTOLIN HFA) 108 (90 Base) MCG/ACT inhaler Inhale 2 puffs into the lungs every 4 (four) hours as needed for wheezing or shortness of breath.   apixaban (ELIQUIS) 5 MG TABS tablet Take 1 tablet (5 mg total) by mouth 2 (two) times daily.   aspirin EC 81 MG tablet Take 81 mg by  mouth daily. Swallow whole.   azelastine (ASTELIN) 0.1 % nasal spray Place 2 sprays into both nostrils at bedtime as needed for rhinitis. Use in each nostril as directed   dextromethorphan-guaiFENesin (MUCINEX DM) 30-600 MG 12hr tablet Take 1 tablet by mouth 2 (two) times daily as needed for cough.   fluticasone (FLONASE) 50 MCG/ACT nasal spray Place 2 sprays into both nostrils daily as needed for allergies or rhinitis.   glucose blood (FREESTYLE TEST STRIPS) test strip Check blood sugar no more than twice daily   isosorbide mononitrate (IMDUR) 30 MG 24 hr tablet Take 1 tablet (30 mg total) by mouth 2 (two) times daily.   metFORMIN (GLUCOPHAGE) 500 MG tablet Take 1 tablet (500 mg total) by mouth daily with breakfast.   metoprolol tartrate (LOPRESSOR) 25 MG tablet Take 0.5 tablets (12.5 mg total) by mouth daily.   Misc. Devices (ACAPELLA) MISC Use as directed   Multiple Vitamin (MULTIVITAMIN WITH MINERALS) TABS tablet Take 1 tablet by mouth daily.   nitroGLYCERIN (NITROSTAT) 0.4 MG SL tablet Place 1 tablet (0.4 mg total) under the tongue every 5 (five) minutes as needed for chest pain (up to 3 doses only.).   pravastatin (PRAVACHOL) 40 MG tablet Take 1 tablet (40 mg total) by mouth daily.   Tiotropium Bromide-Olodaterol (STIOLTO RESPIMAT) 2.5-2.5 MCG/ACT  AERS Inhale 2 puffs into the lungs daily.   [DISCONTINUED] Fluticasone-Salmeterol (ADVAIR) 250-50 MCG/DOSE AEPB Inhale 1 puff into the lungs 2 (two) times daily.                Objective:   Physical Exam     02/25/2021           170  05/06/2020        168  12/04/2019         172  05/11/2019         174  03/17/2019         179  09/15/2018         178  06/15/2018       173  12/13/2017         175  06/14/2017       173  03/12/2017         170  01/15/2017         172  11/30/2016           175   Vital signs reviewed  02/25/2021  - Note at rest 02 sats  95% on RA   General appearance:    hoarse amb wm nad     Reports upper and lower partials     HEENT : pt wearing mask not removed for exam due to covid -19 concerns.    NECK :  without JVD/Nodes/TM/ nl carotid upstrokes bilaterally   LUNGS: no acc muscle use,  Mod barrel  contour chest wall with bilateral  Distant bs s audible wheeze and  without cough on insp or exp maneuvers and mod  Hyperresonant  to  percussion bilaterally     CV:  RRR  no s3 or murmur or increase in P2, and no edema   ABD:  soft and nontender with pos mid insp Hoover's  in the supine position. No bruits or organomegaly appreciated, bowel sounds nl  MS:     ext warm without deformities, calf tenderness, cyanosis or clubbing No obvious joint restrictions   SKIN: warm and dry without lesions    NEURO:  alert, approp, nl sensorium with  no motor or cerebellar deficits apparent.           Assessment & Plan:

## 2021-02-25 NOTE — Assessment & Plan Note (Signed)
Says it is intermittent, not persistent  >>>Try off dpi and ICS 02/25/2021 and if not better in 4 weeks > ent eval

## 2021-02-25 NOTE — Patient Instructions (Addendum)
Plan A = Automatic = Always=    stiolto  2 puffs each am    Plan B = Backup (to supplement plan A, not to replace it) Only use your albuterol (proair)  inhaler as a rescue medication to be used if you can't catch your breath by resting or doing a relaxed purse lip breathing pattern.  - The less you use it, the better it will work when you need it. - Ok to use the inhaler up to 2 puffs  every 4 hours if you must but call for appointment if use goes up over your usual need - Don't leave home without it !!  (think of it like the spare tire for your car)      Please schedule a follow up office visit in 4 weeks, sooner if needed

## 2021-02-27 NOTE — Progress Notes (Signed)
Remote pacemaker transmission.   

## 2021-03-20 DIAGNOSIS — H6122 Impacted cerumen, left ear: Secondary | ICD-10-CM | POA: Diagnosis not present

## 2021-03-20 DIAGNOSIS — H6123 Impacted cerumen, bilateral: Secondary | ICD-10-CM | POA: Diagnosis not present

## 2021-03-20 DIAGNOSIS — H6121 Impacted cerumen, right ear: Secondary | ICD-10-CM | POA: Diagnosis not present

## 2021-03-24 ENCOUNTER — Ambulatory Visit: Payer: HMO | Admitting: Podiatry

## 2021-03-24 ENCOUNTER — Other Ambulatory Visit: Payer: Self-pay

## 2021-03-24 DIAGNOSIS — L97512 Non-pressure chronic ulcer of other part of right foot with fat layer exposed: Secondary | ICD-10-CM | POA: Diagnosis not present

## 2021-03-24 DIAGNOSIS — E0843 Diabetes mellitus due to underlying condition with diabetic autonomic (poly)neuropathy: Secondary | ICD-10-CM

## 2021-03-24 NOTE — Progress Notes (Signed)
   Subjective:  87 y.o. male  presenting today for follow-up evaluation of an ulcer to the plantar aspect of the right first MTPJ.  Patient has a very high arch foot that causes a lot of pressure to the area.  Patient states that over the last few weeks she has done increased amount of walking which may have exacerbated his ulcer.  He presents for further treatment and evaluation  Past Medical History:  Diagnosis Date   Allergic rhinitis    Aortic dissection ([place])    f/u by cards - AAA with distal dissection.   CAD (coronary artery disease)    a. s/p MI 1992. b. Prior hx stenting to RCA/Cx. b. 05/2013: [place] s/p DES to distal RCA then staged DES to prox LAD.    COPD with asthma ([place])    Diabetes mellitus    Hyperlipidemia    Lichen planus    Right midline inferior chest, Dr. [name] Sensing   SCC (squamous cell carcinoma)    Shingles       Objective/Physical Exam General: The patient is alert and oriented x3 in no acute distress.  Dermatology:  Wound #1 noted to the 0.5x0.6 x 0.1 cm (LxWxD).  There appears to be significant improvement since last visit.   To the noted ulceration(s), there is no eschar. There is a minimal amount of slough, fibrin, and necrotic tissue noted. Granulation tissue and wound base is red. There is a minimal amount of serosanguineous drainage noted. There is no exposed bone muscle-tendon ligament or joint. There is no malodor. Periwound integrity is intact.  Vascular: Palpable pedal pulses bilaterally. No edema or erythema noted. Capillary refill within normal limits.  Neurological: Epicritic and protective threshold diminished bilaterally.   Musculoskeletal Exam: Range of motion within normal limits to all pedal and ankle joints bilateral. Muscle strength 5/5 in all groups bilateral. Plantarflexed first ray.   Assessment: 1.  Ulcer subfirst MTPJ right foot secondary to diabetes mellitus  Plan of Care:  1. Patient was evaluated.   2. medically necessary  excisional debridement including subcutaneous tissue was performed using a tissue nipper and a chisel blade. Excisional debridement of all the necrotic nonviable tissue down to healthy bleeding viable tissue was performed with post-debridement measurements same as pre-. 3. the wound was cleansed and dry sterile dressing applied. 4.  Continue gentamicin cream daily 5.  Continue offloading dancers pad to the insoles of the shoes.  Offloading felt pads were adjusted today and his diabetic insoles  6. Patient is to return to clinic in 3 weeks   [name], DPM [facility]  Dr. [name], DPM    [address], [place] [address]                Office [phone]  Fax [phone]

## 2021-03-28 ENCOUNTER — Ambulatory Visit: Payer: HMO | Admitting: Internal Medicine

## 2021-04-11 ENCOUNTER — Encounter: Payer: Self-pay | Admitting: Internal Medicine

## 2021-04-11 ENCOUNTER — Ambulatory Visit: Payer: HMO | Admitting: Internal Medicine

## 2021-04-11 ENCOUNTER — Other Ambulatory Visit: Payer: Self-pay

## 2021-04-11 DIAGNOSIS — J449 Chronic obstructive pulmonary disease, unspecified: Secondary | ICD-10-CM

## 2021-04-11 NOTE — Progress Notes (Addendum)
History of Present Illness Phillip Jordan is a 87 y.o. male with COPD. He is followed by Dr. [name].  Maintenance   Stiolto 2.5/2.5 [employer] as rescue Astelin nasal spray Mucinex DM He is also on Eliquis    09/01/2021 Pt. Presents for follow up. He called the office 08/12/2021 needing a refill on his albuterol for a severe flare of COPD. The medication was sent in per his request. He states he was wheezing, and coughing more.  Test Results: (fev1 1.38L/44%, ratio 51 in Jan 2009). C/w GOLD III  Criteria   CBC Latest Ref Rng & Units 04/22/2021 10/11/2020 08/29/2020  WBC 4.0 - 10.5 K/uL 7.2 10.3 11.6(H)  Hemoglobin 13.0 - 17.0 g/dL 14.1 15.2 15.9  Hematocrit 39.0 - 52.0 % 42.2 44.1 46.5  Platelets 150.0 - 400.0 K/uL 264.0 282 278    BMP Latest Ref Rng & Units 07/28/2021 02/21/2021 10/11/2020  Glucose 70 - 99 mg/dL 125(H) 126(H) 111(H)  BUN 6 - 23 mg/dL '12 16 14  '$ Creatinine 0.40 - 1.50 mg/dL 0.93 0.98 0.92  BUN/Creat Ratio 10 - 24 - - 15  Sodium 135 - 145 mEq/L 133(L) 135 134  Potassium 3.5 - 5.1 mEq/L 4.6 4.5 4.8  Chloride 96 - 112 mEq/L 95(L) 97 94(L)  CO2 19 - 32 mEq/L '31 30 22  '$ Calcium 8.4 - 10.5 mg/dL 9.6 9.6 9.6    BNP No results found for: BNP  ProBNP    Component Value Date/Time   PROBNP 64.5 11/01/2008 0010    PFT    Component Value Date/Time   FEV1PRE 0.93 05/21/2016 1052   FEV1POST 1.23 05/21/2016 1052   FVCPRE 2.22 05/21/2016 1052   FVCPOST 2.74 05/21/2016 1052   TLC 6.56 05/21/2016 1052   DLCOUNC 16.84 05/21/2016 1052   PREFEV1FVCRT 42 05/21/2016 1052   PSTFEV1FVCRT 45 05/21/2016 1052    CUP PACEART REMOTE DEVICE CHECK  Result Date: 08/06/2021 Scheduled remote reviewed. Normal device function.  Next remote 91 days. LR    Past medical hx Past Medical History:  Diagnosis Date   Allergic rhinitis    Aortic dissection ([place])    f/u by cards - AAA with distal dissection.   CAD (coronary artery disease)    a. s/p MI 1992. b. Prior hx stenting to  RCA/Cx. b. 05/2013: [place] s/p DES to distal RCA then staged DES to prox LAD.    COPD with asthma ([place])    Diabetes mellitus    Hyperlipidemia    Lichen planus    Right midline inferior chest, Dr. [name] Sensing   SCC (squamous cell carcinoma)    Shingles      Social History   Tobacco Use   Smoking status: Former    Packs/day: 1.50    Years: 39.00    Pack years: 58.50    Types: Cigarettes    Quit date: 08/24/1992    Years since quitting: 29.0   Smokeless tobacco: Never  Vaping Use   Vaping Use: Never used  Substance Use Topics   Alcohol use: No   Drug use: No    Mr.[name] reports that he quit smoking about 29 years ago. His smoking use included cigarettes. He has a 58.50 pack-year smoking history. He has never used smokeless tobacco. He reports that he does not drink alcohol and does not use drugs.  Tobacco Cessation: Counseling given: Not Answered   Past surgical hx, Family hx, Social hx all reviewed.  Current Outpatient Medications on File Prior to Visit  Medication Sig   apixaban (ELIQUIS) 5 MG TABS tablet Take 1 tablet (5 mg total) by mouth 2 (two) times daily.   aspirin EC 81 MG tablet Take 81 mg by mouth daily. Swallow whole.   azelastine (ASTELIN) 0.1 % nasal spray Place 2 sprays into both nostrils at bedtime as needed for rhinitis. Use in each nostril as directed   dextromethorphan-guaiFENesin (MUCINEX DM) 30-600 MG 12hr tablet Take 1 tablet by mouth 2 (two) times daily as needed for cough.   fluticasone (FLONASE) 50 MCG/ACT nasal spray Place 2 sprays into both nostrils daily as needed for allergies or rhinitis.   glucose blood (FREESTYLE TEST STRIPS) test strip Check blood sugar no more than twice daily   Multiple Vitamin (MULTIVITAMIN WITH MINERALS) TABS tablet Take 1 tablet by mouth daily.   nitroGLYCERIN (NITROSTAT) 0.4 MG SL tablet Place 1 tablet (0.4 mg total) under the tongue every 5 (five) minutes as needed for chest pain (up to 3 doses only.).   Tiotropium  Bromide-Olodaterol (STIOLTO RESPIMAT) 2.5-2.5 MCG/ACT AERS Inhale 2 puffs into the lungs daily.   No current facility-administered medications on file prior to visit.     No Known Allergies  Review Of Systems:  Constitutional:   No  weight loss, night sweats,  Fevers, chills, fatigue, or  lassitude.  HEENT:   No headaches,  Difficulty swallowing,  Tooth/dental problems, or  Sore throat,                No sneezing, itching, ear ache, nasal congestion, post nasal drip,   CV:  No chest pain,  Orthopnea, PND, swelling in lower extremities, anasarca, dizziness, palpitations, syncope.   GI  No heartburn, indigestion, abdominal pain, nausea, vomiting, diarrhea, change in bowel habits, loss of appetite, bloody stools.   Resp: No shortness of breath with exertion or at rest.  No excess mucus, no productive cough,  No non-productive cough,  No coughing up of blood.  No change in color of mucus.  No wheezing.  No chest wall deformity  Skin: no rash or lesions.  GU: no dysuria, change in color of urine, no urgency or frequency.  No flank pain, no hematuria   MS:  No joint pain or swelling.  No decreased range of motion.  No back pain.  Psych:  No change in mood or affect. No depression or anxiety.  No memory loss.   Vital Signs BP 118/68 (BP Location: Left Arm, Patient Position: Sitting, Cuff Size: Normal)   Pulse 60   Temp 97.8 F (36.6 C) (Oral)   Ht '5\' 9"'$  (1.753 m)   Wt 172 lb 9.6 oz (78.3 kg)   SpO2 95% Comment: ra  BMI 25.49 kg/m    Physical Exam:  General- No distress,  A&Ox3 ENT: No sinus tenderness, TM clear, pale nasal mucosa, no oral exudate,no post nasal drip, no LAN Cardiac: S1, S2, regular rate and rhythm, no murmur Chest: No wheeze/ rales/ dullness; no accessory muscle use, no nasal flaring, no sternal retractions Abd.: Soft Non-tender Ext: No clubbing cyanosis, edema Neuro:  normal strength Skin: No rashes, warm and dry Psych: normal mood and  behavior   Assessment/Plan  Phillip Spatz, NP 09/01/2021  10:25 AM

## 2021-04-11 NOTE — Patient Instructions (Addendum)
No change in medications   If you are satisfied with your treatment plan,  let your doctor know and he/she can either refill your medications or you can return here when your prescription runs out.     If in any way you are not 100% satisfied,  please tell us.  If 100% better, tell your friends!  Pulmonary follow up is as needed

## 2021-04-11 NOTE — Assessment & Plan Note (Addendum)
Quit smoking in 1994   - PFT's 07/02/11  FEV1  1.41 (55%) ratio 66 and 12% better p B2 with DLCO 96% - PFTs  07/05/2013 FEV1  1.05 and 1.31 p B2 (24%) with DLCO 59 corrects to 84%  - Spiriva and other mdi result in hoarseness - hfa 05/25/2013 75% p coaching>  90% 07/05/13  - added gerd rx 05/25/2013 >> improved 07/05/13  - rx dulera 200 2bid 07/05/13 > better 10/03/2013   - 10/08/2015 added GERD rx back due to pm strangling/ hoarseness  - 10/25/2015 added flutter - Rehab completed Aug 2017  - PFT's  05/21/2016  FEV1 1.23 (50 % ) ratio 45  p 32 % improvement from saba p dulera 200 prior to study with DLCO  56/57 % corrects to 72  % for alv volume    - added pred x 6 days as backup plan prn as of 03/12/2017   - 02/25/2021  After extensive coaching inhaler device,  effectiveness =    75% with smi > try stiolto 2 pffs each am x 4 week sample to see if hoareness better  > 04/11/2021 both hoarseness and breathing improved > f/u pulmonary prn and ent if hoarseness persists  Pt is Group B in terms of symptom/risk and laba/lama therefore appropriate rx at this point >>>  Continue stiolto and prn saba  Re saba: I spent extra time with pt today reviewing appropriate use of albuterol for prn use on exertion with the following points: 1) saba is for relief of sob that does not improve by walking a slower pace or resting but rather if the pt does not improve after trying this first. 2) If the pt is convinced, as many are, that saba helps recover from activity faster then it's easy to tell if this is the case by re-challenging : ie stop, take the inhaler, then p 5 minutes try the exact same activity (intensity of workload) that just caused the symptoms and see if they are substantially diminished or not after saba 3) if there is an activity that reproducibly causes the symptoms, try the saba 15 min before the activity on alternate days   If in fact the saba really does help, then fine to continue to use it prn but  advised may need to look closer at the maintenance regimen being used to achieve better control of airways disease with exertion.            Each maintenance medication was reviewed in detail including emphasizing most importantly the difference between maintenance and prns and under what circumstances the prns are to be triggered using an action plan format where appropriate.  Total time for H and P, chart review, counseling, reviewing hfa and smi device(s) and generating customized AVS unique to this office visit / same day charting = 24 min

## 2021-04-14 ENCOUNTER — Other Ambulatory Visit: Payer: Self-pay

## 2021-04-14 ENCOUNTER — Ambulatory Visit: Payer: HMO | Admitting: Podiatrist

## 2021-04-14 ENCOUNTER — Encounter: Payer: Self-pay | Admitting: Podiatrist

## 2021-04-14 DIAGNOSIS — L97511 Non-pressure chronic ulcer of other part of right foot limited to breakdown of skin: Secondary | ICD-10-CM

## 2021-04-14 MED ORDER — GENTAMICIN SULFATE 0.1 % EX CREA: 1.0000 "application " | TOPICAL_CREAM | Freq: Three times a day (TID) | CUTANEOUS | 3 refills | Status: DC

## 2021-04-14 NOTE — Progress Notes (Signed)
Chief Complaint  Patient presents with   Follow-up    3 week follow up plantar right foot blood blister.      HPI: Patient is 87 y.o. male who presents today for follow up of ulceration right foot.  He sees Dr. Amalia Hailey and states he feels like it may have healed since his last visit as he is no longer seeing blood on his sock.  He has neuropathy and has had problems with this area ulcerating in the past.    No Known Allergies  Review of systems is reviewed and negative.   Physical Exam  Dermatology:  Wound submetatarsal 1 of the right foot appears almost healed.  There is a central area of hemorrhagic tissue that is intact with debridement.  No drainage, swelling, redness or signs of infection noted. No necrotic tissue encountered.  There is no exposed bone muscle-tendon ligament or joint. There is no malodor. Periwound integrity is intact.   Vascular: Palpable pedal pulses bilaterally. No edema or erythema noted. Capillary refill within normal limits.   Neurological: Epicritic and protective threshold diminished bilaterally.    Musculoskeletal Exam: Range of motion within normal limits to all pedal and ankle joints bilateral. Muscle strength 5/5 in all groups bilateral. Plantarflexed first ray. .    Assessment:  1. Ulcerated, foot, right, limited to breakdown of skin ([place])      Plan: Pared the ulcerated tissue with a 15 blade.  Applied iodosorb and a dressing to the foot and recommended he continue to use the gentamycin for another week.  New rx for gentamycin was called in.  He will return for a recheck with Dr. Amalia Hailey in 4 weeks.  If the area starts to look worse or if he notices any drainage he is instructed to call.

## 2021-04-21 ENCOUNTER — Telehealth: Payer: Self-pay | Admitting: Internal Medicine

## 2021-04-21 NOTE — Telephone Encounter (Signed)
Per Pt's chart he has a hx of vertigo- any recommendations for him?

## 2021-04-21 NOTE — Telephone Encounter (Signed)
Spoke w/ Pt- he states he has been having dizziness not similar to his previous vertigo episodes. We scheduled a visit 04/22/21 at 9:20 w/ PCP.

## 2021-04-21 NOTE — Telephone Encounter (Signed)
Recommend PT referral for vestibular rehab. If patient declines, schedule a office visit to discuss

## 2021-04-21 NOTE — Telephone Encounter (Signed)
Suanne Marker, a nurse from [employer] is calling with the patient regarding the patients dizziness. Pt states that he usually gets dizzy when he is sitting and then stands, when laying down and trying to sit up, or when he bends over. Suanne Marker would like for someone to follow up with the patient regarding his dizziness.  Please Advice.

## 2021-04-22 ENCOUNTER — Encounter: Payer: Self-pay | Admitting: Internal Medicine

## 2021-04-22 ENCOUNTER — Ambulatory Visit (INDEPENDENT_AMBULATORY_CARE_PROVIDER_SITE_OTHER): Payer: HMO | Admitting: Internal Medicine

## 2021-04-22 ENCOUNTER — Other Ambulatory Visit: Payer: Self-pay

## 2021-04-22 VITALS — BP 172/78 | HR 65 | Temp 97.8°F | Resp 16 | Ht 69.0 in | Wt 173.4 lb

## 2021-04-22 DIAGNOSIS — J449 Chronic obstructive pulmonary disease, unspecified: Secondary | ICD-10-CM | POA: Diagnosis not present

## 2021-04-22 DIAGNOSIS — I10 Essential (primary) hypertension: Secondary | ICD-10-CM

## 2021-04-22 DIAGNOSIS — R42 Dizziness and giddiness: Secondary | ICD-10-CM

## 2021-04-22 LAB — CBC WITH DIFFERENTIAL/PLATELET
Basophils Absolute: 0.1 10*3/uL (ref 0.0–0.1)
Basophils Relative: 1.5 % (ref 0.0–3.0)
Eosinophils Absolute: 0.4 10*3/uL (ref 0.0–0.7)
Eosinophils Relative: 4.9 % (ref 0.0–5.0)
HCT: 42.2 % (ref 39.0–52.0)
Hemoglobin: 14.1 g/dL (ref 13.0–17.0)
Lymphocytes Relative: 20.9 % (ref 12.0–46.0)
Lymphs Abs: 1.5 10*3/uL (ref 0.7–4.0)
MCHC: 33.5 g/dL (ref 30.0–36.0)
MCV: 91.3 fl (ref 78.0–100.0)
Monocytes Absolute: 0.8 10*3/uL (ref 0.1–1.0)
Monocytes Relative: 10.8 % (ref 3.0–12.0)
Neutro Abs: 4.5 10*3/uL (ref 1.4–7.7)
Neutrophils Relative %: 61.9 % (ref 43.0–77.0)
Platelets: 264 10*3/uL (ref 150.0–400.0)
RBC: 4.62 Mil/uL (ref 4.22–5.81)
RDW: 13.2 % (ref 11.5–15.5)
WBC: 7.2 10*3/uL (ref 4.0–10.5)

## 2021-04-22 NOTE — Assessment & Plan Note (Signed)
Dizziness: As described above, similar to chronic dizziness but more noticeable lately. Orthostatic vital signs today: He has some drop of BP when standing.  Heart rate was unchanged. Suspect symptoms are a combination of orthostatic drop blood pressure (d/t BBs?), neuropathy, middle ear problems, etc. Recent BMP satisfactory Plan:, check CBC.  Good hydration, continue using the cane and fall precautions. A flutter, tachybradycardia syndrome: Heart rate controlled metoprolol 12.5 mg twice daily HTN: Typically well controlled, BP slightly elevated today. At home is in the 140s with excursions in the 160s.  We agreed on monitor BPs closely and call in 1 week with readings COPD, asthma, abnormal chest x-ray. Saw pulmonary, CXR not repeated, but COPD medications changes to Stiolto and albuterol as needed.  Patient feels better.  O2 sat 93%, at pulmonary few weeks ago 95%.  Plan: No change

## 2021-04-22 NOTE — Progress Notes (Signed)
Subjective:    Patient ID: Phillip Jordan, male    DOB: 18-Oct-1929, 87 y.o.   MRN: [id number]  DOS:  04/22/2021 Type of visit - description: Acute Has a long history of dizziness, in the last few months dizziness has been more noticeable. It certainly triggered by standing up, it lasts few seconds. Sometimes is triggered by turning his head in bed. He denies any syncope or fall. Denies nausea vomiting.  No blood in the stools. No headache, diplopia, slurred speech.  Respiratory symptoms: Improved.   BP Readings from Last 3 Encounters:  04/22/21 (!) 172/78  04/11/21 118/68  02/25/21 130/60   Review of Systems See above   Past Medical History:  Diagnosis Date   Allergic rhinitis    Aortic dissection ([place])    f/u by cards - AAA with distal dissection.   CAD (coronary artery disease)    a. s/p MI 1992. b. Prior hx stenting to RCA/Cx. b. 05/2013: [place] s/p DES to distal RCA then staged DES to prox LAD.    COPD with asthma ([place])    Diabetes mellitus    Hyperlipidemia    Lichen planus    Right midline inferior chest, Dr. [name] Sensing   SCC (squamous cell carcinoma)    Shingles     Past Surgical History:  Procedure Laterality Date   BACK SURGERY  1993   CARDIAC CATHETERIZATION     left, w/ coronary angiography and left ventriculograpy   CARDIOVERSION N/A 09/05/2020   Procedure: CARDIOVERSION;  Surgeon: [name], MD;  Location: [facility] ENDOSCOPY;  Service: Cardiovascular;  Laterality: N/A;   LEFT HEART CATHETERIZATION WITH CORONARY ANGIOGRAM N/A 06/09/2013   Procedure: LEFT HEART CATHETERIZATION WITH CORONARY ANGIOGRAM;  Surgeon: [name], MD;  Location: [facility] CATH LAB;  Service: Cardiovascular;  Laterality: N/A;   PACEMAKER IMPLANT N/A 11/06/2020   Procedure: PACEMAKER IMPLANT;  Surgeon: Evans Lance, MD;  Location: [place] CV LAB;  Service: Cardiovascular;  Laterality: N/A;   PERCUTANEOUS CORONARY STENT INTERVENTION (PCI-S) N/A 06/12/2013   Procedure:  PERCUTANEOUS CORONARY STENT INTERVENTION (PCI-S);  Surgeon: [name], MD;  Location: [facility] CATH LAB;  Service: Cardiovascular;  Laterality: N/A;  prox LAD   PTCA     w/ placement of drugeluting stent in the distal right coronary artery, and distal Cardiologist: [name]    Allergies as of 04/22/2021   No Known Allergies      Medication List        Accurate as of April 22, 2021  4:27 PM. If you have any questions, ask your nurse or doctor.          Acapella Misc Use as directed   albuterol 108 (90 Base) MCG/ACT inhaler Commonly known as: ProAir HFA 2 puffs up to every 4 hours as needed only  if your can't catch your breath   apixaban 5 MG Tabs tablet Commonly known as: Eliquis Take 1 tablet (5 mg total) by mouth 2 (two) times daily.   aspirin EC 81 MG tablet Take 81 mg by mouth daily. Swallow whole.   azelastine 0.1 % nasal spray Commonly known as: ASTELIN Place 2 sprays into both nostrils at bedtime as needed for rhinitis. Use in each nostril as directed   dextromethorphan-guaiFENesin 30-600 MG 12hr tablet Commonly known as: MUCINEX DM Take 1 tablet by mouth 2 (two) times daily as needed for cough.   fluticasone 50 MCG/ACT nasal spray Commonly known as: FLONASE Place 2 sprays into both nostrils  daily as needed for allergies or rhinitis.   FREESTYLE TEST STRIPS test strip Generic drug: glucose blood Check blood sugar no more than twice daily   gentamicin cream 0.1 % Commonly known as: GARAMYCIN Apply 1 application topically 3 (three) times daily.   isosorbide mononitrate 30 MG 24 hr tablet Commonly known as: IMDUR Take 1 tablet (30 mg total) by mouth 2 (two) times daily.   metFORMIN 500 MG tablet Commonly known as: GLUCOPHAGE Take 1 tablet (500 mg total) by mouth daily with breakfast.   metoprolol tartrate 25 MG tablet Commonly known as: LOPRESSOR Take 0.5 tablets (12.5 mg total) by mouth daily.   multivitamin with minerals Tabs  tablet Take 1 tablet by mouth daily.   nitroGLYCERIN 0.4 MG SL tablet Commonly known as: NITROSTAT Place 1 tablet (0.4 mg total) under the tongue every 5 (five) minutes as needed for chest pain (up to 3 doses only.).   pravastatin 40 MG tablet Commonly known as: PRAVACHOL Take 1 tablet (40 mg total) by mouth daily.   Stiolto Respimat 2.5-2.5 MCG/ACT Aers Generic drug: Tiotropium Bromide-Olodaterol Inhale 2 puffs into the lungs daily. What changed: Another medication with the same name was removed. Continue taking this medication, and follow the directions you see here. Changed by: Kathlene November, MD           Objective:   Physical Exam BP (!) 172/78 (BP Location: Left Arm, Patient Position: Sitting, Cuff Size: Small)   Pulse 65   Temp 97.8 F (36.6 C) (Oral)   Resp 16   Ht '5\' 9"'$  (1.753 m)   Wt 173 lb 6 oz (78.6 kg)   SpO2 93%   BMI 25.60 kg/m  General:   Well developed, NAD, BMI noted. HEENT:  Normocephalic . Face symmetric, atraumatic Lungs:  CTA B Normal respiratory effort, no intercostal retractions, no accessory muscle use. Heart: RRR,  no murmur.  Lower extremities: no pretibial edema bilaterally  Skin: Not pale. Not jaundice Neurologic:  alert & oriented X3.  Speech normal, gait appropriate for age and unassisted Psych--  Cognition and judgment appear intact.  Cooperative with normal attention span and concentration.  Behavior appropriate. No anxious or depressed appearing.      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 D/c norvasc, plavix, rx metoprolol eliquis   anticoagulated *Underwent DCCV 09/05/2020, metoprolol dose adjusted due to bradycardia. *pacemaker (11/06/2020) COPD and asthma (on GERD meds)--- Dr  [name] Lichen planus Dr. Ronnald Ramp Chronic hoarseness, status post ENT eval ~ 2012 Normal DEXA 2012 HOH H/o Shingles  PLAN: Dizziness: As described above, similar to chronic dizziness but more noticeable lately. Orthostatic vital signs today: He has some drop of BP when standing.  Heart rate was unchanged. Suspect symptoms are a combination of orthostatic drop blood pressure (d/t BBs?), neuropathy, middle ear problems, etc. Recent BMP satisfactory Plan:, check CBC.  Good hydration, continue using the cane and fall precautions. A flutter, tachybradycardia syndrome: Heart rate controlled metoprolol 12.5 mg twice daily HTN: Typically well controlled, BP slightly elevated today. At home is in the 140s with excursions in the 160s.  We agreed on monitor BPs closely and call in 1 week with readings COPD, asthma, abnormal chest x-ray. Saw pulmonary, CXR not repeated, but COPD medications changes to Stiolto and albuterol  as needed.  Patient feels better.  O2 sat 93%, at pulmonary few weeks ago 95%.  Plan: No change   This visit occurred during the SARS-CoV-2 public health emergency.  Safety protocols were in place, including screening questions prior to the visit, additional usage of staff PPE, and extensive cleaning of exam room while observing appropriate contact time as indicated for disinfecting solutions.

## 2021-04-22 NOTE — Patient Instructions (Addendum)
Continue same medications  Check the  blood pressure daily BP GOAL is between 110/65 and  135/85. Call with your blood pressure readings in 1 week  Drink plenty of fluids, stay hydrated  If you have severe dizziness, chest pain, difficulty breathing: Call the office or go to the ER  GO TO THE LAB : Get the blood work

## 2021-04-24 ENCOUNTER — Other Ambulatory Visit: Payer: Self-pay | Admitting: Internal Medicine

## 2021-04-29 ENCOUNTER — Encounter: Payer: Self-pay | Admitting: Internal Medicine

## 2021-05-06 ENCOUNTER — Ambulatory Visit: Payer: PPO | Admitting: Internal Medicine

## 2021-05-07 ENCOUNTER — Ambulatory Visit (INDEPENDENT_AMBULATORY_CARE_PROVIDER_SITE_OTHER): Payer: HMO

## 2021-05-07 DIAGNOSIS — I495 Sick sinus syndrome: Secondary | ICD-10-CM

## 2021-05-07 LAB — CUP PACEART REMOTE DEVICE CHECK
Battery Remaining Longevity: 146 mo
Battery Voltage: 3.15 V
Brady Statistic AP VP Percent: 70.38 %
Brady Statistic AP VS Percent: 0.05 %
Brady Statistic AS VP Percent: 26.62 %
Brady Statistic AS VS Percent: 2.95 %
Brady Statistic RA Percent Paced: 71.53 %
Brady Statistic RV Percent Paced: 97 %
Date Time Interrogation Session: 20220913200053
Implantable Lead Implant Date: 20220316
Implantable Lead Implant Date: 20220316
Implantable Lead Location: 753859
Implantable Lead Location: 753860
Implantable Lead Model: 3830
Implantable Lead Model: 5076
Implantable Pulse Generator Implant Date: 20220316
Lead Channel Impedance Value: 380 Ohm
Lead Channel Impedance Value: 513 Ohm
Lead Channel Impedance Value: 513 Ohm
Lead Channel Impedance Value: 608 Ohm
Lead Channel Pacing Threshold Amplitude: 0.625 V
Lead Channel Pacing Threshold Amplitude: 0.625 V
Lead Channel Pacing Threshold Pulse Width: 0.4 ms
Lead Channel Pacing Threshold Pulse Width: 0.4 ms
Lead Channel Sensing Intrinsic Amplitude: 16.625 mV
Lead Channel Sensing Intrinsic Amplitude: 16.625 mV
Lead Channel Sensing Intrinsic Amplitude: 9 mV
Lead Channel Sensing Intrinsic Amplitude: 9 mV
Lead Channel Setting Pacing Amplitude: 1.5 V
Lead Channel Setting Pacing Amplitude: 2 V
Lead Channel Setting Pacing Pulse Width: 0.4 ms
Lead Channel Setting Sensing Sensitivity: 1.2 mV

## 2021-05-12 ENCOUNTER — Ambulatory Visit: Payer: HMO | Admitting: Podiatry

## 2021-05-12 ENCOUNTER — Other Ambulatory Visit: Payer: Self-pay

## 2021-05-12 DIAGNOSIS — L97511 Non-pressure chronic ulcer of other part of right foot limited to breakdown of skin: Secondary | ICD-10-CM

## 2021-05-12 NOTE — Progress Notes (Signed)
   Subjective:  87 y.o. male  presenting today for follow-up evaluation of an ulcer to the plantar aspect of the right first MTPJ.  Patient has a very high arch foot that causes a lot of pressure to the area.  Patient states that over the last few weeks she has done increased amount of walking which may have exacerbated his ulcer.  He presents for further treatment and evaluation  Past Medical History:  Diagnosis Date   Allergic rhinitis    Aortic dissection ([place])    f/u by cards - AAA with distal dissection.   CAD (coronary artery disease)    a. s/p MI 1992. b. Prior hx stenting to RCA/Cx. b. 05/2013: [place] s/p DES to distal RCA then staged DES to prox LAD.    COPD with asthma ([place])    Diabetes mellitus    Hyperlipidemia    Lichen planus    Right midline inferior chest, Dr. [name] Sensing   SCC (squamous cell carcinoma)    Shingles       Objective/Physical Exam General: The patient is alert and oriented x3 in no acute distress.  Dermatology:  Wound #1 noted to the plantar aspect of the first MTP joint right foot measuring approximately 0.2 x 0.1 x 0.1 cm (LxWxD).  There appears to be significant improvement since last visit.   To the noted ulceration(s), there is no eschar. There is a minimal amount of slough, fibrin, and necrotic tissue noted. Granulation tissue and wound base is red. There is a minimal amount of serosanguineous drainage noted. There is no exposed bone muscle-tendon ligament or joint. There is no malodor. Periwound integrity is intact.  Vascular: Palpable pedal pulses bilaterally. No edema or erythema noted. Capillary refill within normal limits.  Neurological: Epicritic and protective threshold diminished bilaterally.   Musculoskeletal Exam: Range of motion within normal limits to all pedal and ankle joints bilateral. Muscle strength 5/5 in all groups bilateral. Plantarflexed first ray.   Assessment: 1.  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 Patient was evaluated.   2. medically necessary excisional debridement including subcutaneous tissue was performed using a tissue nipper and a chisel blade. Excisional debridement of all the necrotic nonviable tissue down to healthy bleeding viable tissue was performed with post-debridement measurements same as pre-. 3. the wound was cleansed and dry sterile dressing applied. 4.  Continue gentamicin cream daily 5.  Continue offloading dancers pad to the insoles of the shoes.  Offloading felt pads were adjusted today and his diabetic insoles  6. Patient is to return to clinic in 3 weeks   [name], DPM [facility]  Dr. [name], DPM    [address], [place] [address]                Office [phone]  Fax [phone]

## 2021-05-14 ENCOUNTER — Telehealth: Payer: Self-pay | Admitting: Pharmacist

## 2021-05-14 NOTE — Progress Notes (Signed)
Remote pacemaker transmission.   

## 2021-05-14 NOTE — Chronic Care Management (AMB) (Signed)
Chronic Care Management Pharmacy Assistant   Name: Phillip Jordan  MRN: [id number] DOB: October 17, 1929  Reason for Encounter: Disease State Diabetes Mellitus   Recent office visits:  04/22/21-Jose Ladona Horns, MD (PCP) Seen for dizziness. Labs ordered. 02/21/21-Jose Ladona Horns, MD (PCP) General follow up visit. Labs ordered. Follow up in 5 months.  Recent consult visits:  05/12/21 (Podiatry) [name], DPM- Seen for a follow up. Follow up in 3 weeks. 04/14/21 (Podiatry) Bronson Ing, DPM-Seen for a follow up appointment of ulceration right foot. Follow up in  1 month with Dr. Amalia Hailey. 04/11/21 (Pulmonology) Christena Deem. Wert, MD-Seen for COPD. Pulmonary follow up is as needed. 03/24/21 (Podiatry) [name], DPM-Seen for diabetic ulcer. Follow up in 3 weeks. 03/20/21 (Otolaryngology) Jolene Provost Ingegerd Charlotta, PA-C. Seen for cerumen impaction. Follow up in 2 months. 02/25/21 (Pulmonology) Christena Deem. Wert, MD-Seen for COPD follow up. Follow up in 4 weeks.  Hospital visits:  None in previous 6 months  Medications: Outpatient Encounter Medications as of 05/14/2021  Medication Sig Note   albuterol (PROAIR HFA) 108 (90 Base) MCG/ACT inhaler 2 puffs up to every 4 hours as needed only  if your can't catch your breath    apixaban (ELIQUIS) 5 MG TABS tablet Take 1 tablet (5 mg total) by mouth 2 (two) times daily.    aspirin EC 81 MG tablet Take 81 mg by mouth daily. Swallow whole.    azelastine (ASTELIN) 0.1 % nasal spray Place 2 sprays into both nostrils at bedtime as needed for rhinitis. Use in each nostril as directed    dextromethorphan-guaiFENesin (MUCINEX DM) 30-600 MG 12hr tablet Take 1 tablet by mouth 2 (two) times daily as needed for cough.    fluticasone (FLONASE) 50 MCG/ACT nasal spray Place 2 sprays into both nostrils daily as needed for allergies or rhinitis.    gentamicin cream (GARAMYCIN) 0.1 % Apply 1 application topically 3 (three) times daily.    glucose blood  (FREESTYLE TEST STRIPS) test strip Check blood sugar no more than twice daily    isosorbide mononitrate (IMDUR) 30 MG 24 hr tablet Take 1 tablet (30 mg total) by mouth 2 (two) times daily.    metFORMIN (GLUCOPHAGE) 500 MG tablet Take 1 tablet by mouth once daily with breakfast    metoprolol tartrate (LOPRESSOR) 25 MG tablet Take 0.5 tablets (12.5 mg total) by mouth daily.    Misc. Devices (ACAPELLA) MISC Use as directed    Multiple Vitamin (MULTIVITAMIN WITH MINERALS) TABS tablet Take 1 tablet by mouth daily.    nitroGLYCERIN (NITROSTAT) 0.4 MG SL tablet Place 1 tablet (0.4 mg total) under the tongue every 5 (five) minutes as needed for chest pain (up to 3 doses only.). (Patient not taking: Reported on 04/22/2021) 04/22/2021: PRN   pravastatin (PRAVACHOL) 40 MG tablet Take 1 tablet by mouth once daily    Tiotropium Bromide-Olodaterol (STIOLTO RESPIMAT) 2.5-2.5 MCG/ACT AERS Inhale 2 puffs into the lungs daily.    No facility-administered encounter medications on file as of 05/14/2021.   Current antihyperglycemic regimen:  Metformin 500mg  daily  What recent interventions/DTPs have been made to improve glycemic control:  None noted  Have there been any recent hospitalizations or ED visits since last visit with CPP? No  Patient denies hypoglycemic symptoms, including Pale, Sweaty, Shaky, Hungry, Nervous/irritable, and Vision changes  Patient denies hyperglycemic symptoms, including blurry vision, excessive thirst, fatigue, polyuria, and weakness  How often are you checking your blood sugar?  Infrequently  What are your blood sugars ranging?  Fasting:Does not remember the last range Before meals:  After meals:  Bedtime:   During the week, how often does your blood glucose drop below 70? Never  Are you checking your feet daily/regularly?       Patient states he does check his feet daily and both feet have been normal.  Patient states he would like to request a new blood pressure  machine due to his current machine being outdated.  Adherence Review: Is the patient currently on a STATIN medication? Yes Is the patient currently on ACE/ARB medication? No Does the patient have >5 day gap between last estimated fill dates? No   Star Rating Drugs: Pravastatin 40 mg Last filled:05/01/21 90 DS  Myriam Elta Gu[place], [place]

## 2021-05-21 NOTE — Progress Notes (Signed)
I attempted to reach out to patient to inform him this, but it was an unsuccessful out reach.

## 2021-05-26 DIAGNOSIS — H938X3 Other specified disorders of ear, bilateral: Secondary | ICD-10-CM | POA: Diagnosis not present

## 2021-06-16 DIAGNOSIS — H5203 Hypermetropia, bilateral: Secondary | ICD-10-CM | POA: Diagnosis not present

## 2021-06-16 DIAGNOSIS — H524 Presbyopia: Secondary | ICD-10-CM | POA: Diagnosis not present

## 2021-06-16 DIAGNOSIS — E113291 Type 2 diabetes mellitus with mild nonproliferative diabetic retinopathy without macular edema, right eye: Secondary | ICD-10-CM | POA: Diagnosis not present

## 2021-06-16 DIAGNOSIS — Z961 Presence of intraocular lens: Secondary | ICD-10-CM | POA: Diagnosis not present

## 2021-06-16 LAB — HM DIABETES EYE EXAM

## 2021-06-17 ENCOUNTER — Encounter: Payer: Self-pay | Admitting: Internal Medicine

## 2021-07-11 ENCOUNTER — Other Ambulatory Visit: Payer: Self-pay | Admitting: Internal Medicine

## 2021-07-14 ENCOUNTER — Ambulatory Visit: Payer: HMO | Admitting: Podiatry

## 2021-07-14 ENCOUNTER — Other Ambulatory Visit: Payer: Self-pay

## 2021-07-14 DIAGNOSIS — E0843 Diabetes mellitus due to underlying condition with diabetic autonomic (poly)neuropathy: Secondary | ICD-10-CM

## 2021-07-14 DIAGNOSIS — L97512 Non-pressure chronic ulcer of other part of right foot with fat layer exposed: Secondary | ICD-10-CM | POA: Diagnosis not present

## 2021-07-14 NOTE — Progress Notes (Signed)
   Subjective:  87 y.o. male  presenting today for follow-up evaluation of an ulcer to the plantar aspect of the right first MTPJ.  Patient has a very high arch foot that causes a lot of pressure to the area.  Patient states that over the last few weeks she has done increased amount of walking which may have exacerbated his ulcer.  He presents for further treatment and evaluation  Past Medical History:  Diagnosis Date   Allergic rhinitis    Aortic dissection ([place])    f/u by cards - AAA with distal dissection.   CAD (coronary artery disease)    a. s/p MI 1992. b. Prior hx stenting to RCA/Cx. b. 05/2013: [place] s/p DES to distal RCA then staged DES to prox LAD.    COPD with asthma ([place])    Diabetes mellitus    Hyperlipidemia    Lichen planus    Right midline inferior chest, Dr. [name] Sensing   SCC (squamous cell carcinoma)    Shingles       Objective/Physical Exam General: The patient is alert and oriented x3 in no acute distress.  Dermatology:  Wound #1 noted to the plantar aspect of the first MTP joint right foot measuring approximately 0.2 x 0.1 x 0.1 cm (LxWxD).  Wound is very superficial and stable To the noted ulceration(s), there is no eschar. There is a minimal amount of slough, fibrin, and necrotic tissue noted. Granulation tissue and wound base is red. There is a minimal amount of serosanguineous drainage noted. There is no exposed bone muscle-tendon ligament or joint. There is no malodor. Periwound integrity is intact.  Vascular: Palpable pedal pulses bilaterally. No edema or erythema noted. Capillary refill within normal limits.  Neurological: Epicritic and protective threshold diminished bilaterally.   Musculoskeletal Exam: Range of motion within normal limits to all pedal and ankle joints bilateral. Muscle strength 5/5 in all groups bilateral. Plantarflexed first ray.   Assessment: 1.  Ulcer subfirst MTPJ right foot secondary to diabetes mellitus; superficial and  stable  Plan of Care:  1. Patient was evaluated.   2. medically necessary excisional debridement including subcutaneous tissue was performed using a tissue nipper and a chisel blade. Excisional debridement of all the necrotic nonviable tissue down to healthy bleeding viable tissue was performed with post-debridement measurements same as pre-. 3. the wound was cleansed and dry sterile dressing applied. 4.  Continue gentamicin cream daily 5.  Continue offloading dancers pad to the insoles of the shoes.  Offloading felt pads were adjusted today and his diabetic insoles  6. Patient is to return to clinic in 3 weeks   [name], DPM [facility]  Dr. [name], DPM    [address], [place] [address]                Office [phone]  Fax [phone]

## 2021-07-28 ENCOUNTER — Ambulatory Visit (INDEPENDENT_AMBULATORY_CARE_PROVIDER_SITE_OTHER): Payer: HMO | Admitting: Internal Medicine

## 2021-07-28 ENCOUNTER — Ambulatory Visit: Payer: HMO | Attending: Internal Medicine

## 2021-07-28 ENCOUNTER — Other Ambulatory Visit (HOSPITAL_BASED_OUTPATIENT_CLINIC_OR_DEPARTMENT_OTHER): Payer: Self-pay

## 2021-07-28 ENCOUNTER — Encounter: Payer: Self-pay | Admitting: Internal Medicine

## 2021-07-28 VITALS — BP 152/78 | HR 70 | Temp 98.0°F | Resp 18 | Ht 69.0 in | Wt 173.4 lb

## 2021-07-28 DIAGNOSIS — E785 Hyperlipidemia, unspecified: Secondary | ICD-10-CM

## 2021-07-28 DIAGNOSIS — H6123 Impacted cerumen, bilateral: Secondary | ICD-10-CM | POA: Diagnosis not present

## 2021-07-28 DIAGNOSIS — I10 Essential (primary) hypertension: Secondary | ICD-10-CM | POA: Diagnosis not present

## 2021-07-28 DIAGNOSIS — Z23 Encounter for immunization: Secondary | ICD-10-CM | POA: Diagnosis not present

## 2021-07-28 DIAGNOSIS — J449 Chronic obstructive pulmonary disease, unspecified: Secondary | ICD-10-CM | POA: Diagnosis not present

## 2021-07-28 DIAGNOSIS — E11319 Type 2 diabetes mellitus with unspecified diabetic retinopathy without macular edema: Secondary | ICD-10-CM | POA: Diagnosis not present

## 2021-07-28 LAB — COMPREHENSIVE METABOLIC PANEL
ALT: 15 U/L (ref 0–53)
AST: 18 U/L (ref 0–37)
Albumin: 4.3 g/dL (ref 3.5–5.2)
Alkaline Phosphatase: 85 U/L (ref 39–117)
BUN: 12 mg/dL (ref 6–23)
CO2: 31 mEq/L (ref 19–32)
Calcium: 9.6 mg/dL (ref 8.4–10.5)
Chloride: 95 mEq/L — ABNORMAL LOW (ref 96–112)
Creatinine, Ser: 0.93 mg/dL (ref 0.40–1.50)
GFR: 72.95 mL/min (ref >60.00)
Glucose, Bld: 125 mg/dL — ABNORMAL HIGH (ref 70–99)
Potassium: 4.6 mEq/L (ref 3.5–5.1)
Sodium: 133 mEq/L — ABNORMAL LOW (ref 135–145)
Total Bilirubin: 0.5 mg/dL (ref 0.2–1.2)
Total Protein: 6.7 g/dL (ref 6.0–8.3)

## 2021-07-28 LAB — HEMOGLOBIN A1C: Hgb A1c MFr Bld: 6.7 % — ABNORMAL HIGH (ref 4.6–6.5)

## 2021-07-28 MED ORDER — MODERNA COVID-19 BIVAL BOOSTER 50 MCG/0.5ML IM SUSP
INTRAMUSCULAR | 0 refills | Status: DC
  Filled 2021-07-28: qty 0.5, 1d supply, fill #0

## 2021-07-28 NOTE — Progress Notes (Signed)
   Covid-19 Vaccination Clinic  Name:  Phillip Jordan    MRN: [id number] DOB: May 07, 1930  07/28/2021  [name] was observed post Covid-19 immunization for 15 minutes without incident. He was provided with Vaccine Information Sheet and instruction to access the V-Safe system.   [name] was instructed to call 911 with any severe reactions post vaccine: Difficulty breathing  Swelling of face and throat  A fast heartbeat  A bad rash all over body  Dizziness and weakness

## 2021-07-28 NOTE — Assessment & Plan Note (Addendum)
DM: On metformin, check A1c. HTN: On Imdur, metoprolol.  Ambulatory BP well within normal when checked at home.  Patient's log reviewed.  Check a CMP Cerumen impaction: Procedure note: With the patient's verbal concern, I remove a significant amount of wax from both ears with a spoon.  No obvious complication, no bleeding, TMs noted to be normal after wax was removed. Dizziness: At baseline, only when he stands up.  No recent falls.  Uses a cane. COPD and asthma: SOB at baseline, occasional wheezing, recommend to continue her Stiolto and use albuterol as needed. Preventive care reviewed vaccinations:  COVID-vaccine recommended Had a flu shot PNM 23 booster today. RTC 4 to 6 months

## 2021-07-28 NOTE — Progress Notes (Signed)
Subjective:    Patient ID: Phillip Jordan, male    DOB: March 20, 1930, 87 y.o.   MRN: [id number]  DOS:  07/28/2021 Type of visit - description: f/u  Since the last office visit he is doing okay.  SOB at baseline. Did report episodes cough and wheezing.  Cough is either dry or he sees clear sputum.  No associated fever or chills.  Denies  palpitations  Think he has earwax, denies any ear discharge or pain.  He uses hearing aids  Review of Systems See above   Past Medical History:  Diagnosis Date   Allergic rhinitis    Aortic dissection ([place])    f/u by cards - AAA with distal dissection.   CAD (coronary artery disease)    a. s/p MI 1992. b. Prior hx stenting to RCA/Cx. b. 05/2013: [place] s/p DES to distal RCA then staged DES to prox LAD.    COPD with asthma ([place])    Diabetes mellitus    Hyperlipidemia    Lichen planus    Right midline inferior chest, Dr. [name] Sensing   SCC (squamous cell carcinoma)    Shingles     Past Surgical History:  Procedure Laterality Date   BACK SURGERY  1993   CARDIAC CATHETERIZATION     left, w/ coronary angiography and left ventriculograpy   CARDIOVERSION N/A 09/05/2020   Procedure: CARDIOVERSION;  Surgeon: [name], MD;  Location: [facility] ENDOSCOPY;  Service: Cardiovascular;  Laterality: N/A;   LEFT HEART CATHETERIZATION WITH CORONARY ANGIOGRAM N/A 06/09/2013   Procedure: LEFT HEART CATHETERIZATION WITH CORONARY ANGIOGRAM;  Surgeon: [name], MD;  Location: [facility] CATH LAB;  Service: Cardiovascular;  Laterality: N/A;   PACEMAKER IMPLANT N/A 11/06/2020   Procedure: PACEMAKER IMPLANT;  Surgeon: Evans Lance, MD;  Location: [facility] CV LAB;  Service: Cardiovascular;  Laterality: N/A;   PERCUTANEOUS CORONARY STENT INTERVENTION (PCI-S) N/A 06/12/2013   Procedure: PERCUTANEOUS CORONARY STENT INTERVENTION (PCI-S);  Surgeon: [name], MD;  Location: [facility] CATH LAB;  Service: Cardiovascular;  Laterality: N/A;  prox LAD   PTCA     w/  placement of drugeluting stent in the distal right coronary artery, and distal Cardiologist: Mark W.Pulsipher    Allergies as of 07/28/2021   No Known Allergies      Medication List        Accurate as of July 28, 2021 10:24 AM. If you have any questions, ask your nurse or doctor.          Acapella Misc Use as directed   albuterol 108 (90 Base) MCG/ACT inhaler Commonly known as: ProAir HFA 2 puffs up to every 4 hours as needed only  if your can't catch your breath   apixaban 5 MG Tabs tablet Commonly known as: Eliquis Take 1 tablet (5 mg total) by mouth 2 (two) times daily.   aspirin EC 81 MG tablet Take 81 mg by mouth daily. Swallow whole.   azelastine 0.1 % nasal spray Commonly known as: ASTELIN Place 2 sprays into both nostrils at bedtime as needed for rhinitis. Use in each nostril as directed   dextromethorphan-guaiFENesin 30-600 MG 12hr tablet Commonly known as: MUCINEX DM Take 1 tablet by mouth 2 (two) times daily as needed for cough.   fluticasone 50 MCG/ACT nasal spray Commonly known as: FLONASE Place 2 sprays into both nostrils daily as needed for allergies or rhinitis.   FREESTYLE TEST STRIPS test strip Generic drug: glucose blood Check blood sugar no more  than twice daily   gentamicin cream 0.1 % Commonly known as: GARAMYCIN Apply 1 application topically 3 (three) times daily.   isosorbide mononitrate 30 MG 24 hr tablet Commonly known as: IMDUR Take 1 tablet by mouth twice daily   metFORMIN 500 MG tablet Commonly known as: GLUCOPHAGE Take 1 tablet by mouth once daily with breakfast   metoprolol tartrate 25 MG tablet Commonly known as: LOPRESSOR Take 0.5 tablets (12.5 mg total) by mouth daily.   multivitamin with minerals Tabs tablet Take 1 tablet by mouth daily.   nitroGLYCERIN 0.4 MG SL tablet Commonly known as: NITROSTAT Place 1 tablet (0.4 mg total) under the tongue every 5 (five) minutes as needed for chest pain (up to 3 doses  only.).   pravastatin 40 MG tablet Commonly known as: PRAVACHOL Take 1 tablet by mouth once daily   Stiolto Respimat 2.5-2.5 MCG/ACT Aers Generic drug: Tiotropium Bromide-Olodaterol Inhale 2 puffs into the lungs daily.           Objective:   Physical Exam BP (!) 152/78 (BP Location: Left Arm, Patient Position: Sitting, Cuff Size: Small)   Pulse 70   Temp 98 F (36.7 C) (Oral)   Resp 18   Ht 5\' 9"  (1.753 m)   Wt 173 lb 6 oz (78.6 kg)   SpO2 97%   BMI 25.60 kg/m  General:   Well developed, NAD, BMI noted. HEENT:  Normocephalic . Face symmetric, atraumatic Cerumen impaction bilaterally. Lungs:  + End expiratory wheezes Normal respiratory effort, no intercostal retractions, no accessory muscle use. Heart: RRR,  no murmur.  Lower extremities: no pretibial edema bilaterally  Skin: Not pale. Not jaundice Neurologic:  alert & oriented X3.  Speech normal, gait appropriate for age and unassisted Psych--  Cognition and judgment appear intact.  Cooperative with normal attention span and concentration.  Behavior appropriate. No anxious or depressed appearing.      Assessment    Assessment Diabetes, + retinopathy, + neuropathy, +CAD HTN Hyperlipidemia CV --CAD --AAA infrarenal , + B iliac stenosis >50%,   last [place] 6/18, stable, Rx observation --RBBB -- LE arterial US (-) 06-2017 --Atrial flutter, tachybradycardia syndrome *dx 07-29-20. D/c norvasc, plavix, rx metoprolol eliquis   anticoagulated *Underwent DCCV 09/05/2020, metoprolol dose adjusted due to bradycardia. *pacemaker (11/06/2020) COPD and asthma (on GERD meds)--- Dr [name] Lichen planus Dr. Ronnald Ramp Chronic hoarseness, status post ENT eval ~ 2012 Normal DEXA 2012 HOH H/o Shingles  PLAN: DM: On metformin, check A1c. HTN: On Imdur, metoprolol.  Ambulatory BP well within normal when checked at home.  Patient's log reviewed.  Check a CMP Cerumen impaction: Procedure note: With the patient's verbal concern, I  remove a significant amount of wax from both ears with a spoon.  No obvious complication, no bleeding, TMs noted to be normal after wax was removed. Dizziness: At baseline, only when he stands up.  No recent falls.  Uses a cane. COPD and asthma: SOB at baseline, occasional wheezing, recommend to continue her Stiolto and use albuterol as needed. Preventive care reviewed vaccinations:  COVID-vaccine recommended Had a flu shot PNM 23 booster today. RTC 4 to 6 months   This visit occurred during the SARS-CoV-2 public health emergency.  Safety protocols were in place, including screening questions prior to the visit, additional usage of staff PPE, and extensive cleaning of exam room while observing appropriate contact time as indicated for disinfecting solutions.

## 2021-07-28 NOTE — Patient Instructions (Addendum)
Check the  blood continue  regularly  pressure 2 or 3 times a month week monthly weekly daily BP GOAL is between 110/65 and  135/85. If it is consistently higher or lower, let me know    For wheezing: Albuterol  2 puffs every 6 hours as needed. If you are using the puffer more than 5 or 6 times a week let me know   GO TO THE LAB : Get the blood work     [place], [place] back for   a physical exam in 3 to 4 months

## 2021-08-06 ENCOUNTER — Ambulatory Visit (INDEPENDENT_AMBULATORY_CARE_PROVIDER_SITE_OTHER): Payer: HMO

## 2021-08-06 DIAGNOSIS — I495 Sick sinus syndrome: Secondary | ICD-10-CM | POA: Diagnosis not present

## 2021-08-06 LAB — CUP PACEART REMOTE DEVICE CHECK
Battery Remaining Longevity: 143 mo
Battery Voltage: 3.09 V
Brady Statistic AP VP Percent: 63.41 %
Brady Statistic AP VS Percent: 0.03 %
Brady Statistic AS VP Percent: 31.89 %
Brady Statistic AS VS Percent: 4.67 %
Brady Statistic RA Percent Paced: 65.38 %
Brady Statistic RV Percent Paced: 95.3 %
Date Time Interrogation Session: 20221212203806
Implantable Lead Implant Date: 20220316
Implantable Lead Implant Date: 20220316
Implantable Lead Location: 753859
Implantable Lead Location: 753860
Implantable Lead Model: 3830
Implantable Lead Model: 5076
Implantable Pulse Generator Implant Date: 20220316
Lead Channel Impedance Value: 380 Ohm
Lead Channel Impedance Value: 513 Ohm
Lead Channel Impedance Value: 532 Ohm
Lead Channel Impedance Value: 608 Ohm
Lead Channel Pacing Threshold Amplitude: 0.625 V
Lead Channel Pacing Threshold Amplitude: 0.75 V
Lead Channel Pacing Threshold Pulse Width: 0.4 ms
Lead Channel Pacing Threshold Pulse Width: 0.4 ms
Lead Channel Sensing Intrinsic Amplitude: 18.125 mV
Lead Channel Sensing Intrinsic Amplitude: 18.125 mV
Lead Channel Sensing Intrinsic Amplitude: 8.25 mV
Lead Channel Sensing Intrinsic Amplitude: 8.25 mV
Lead Channel Setting Pacing Amplitude: 1.5 V
Lead Channel Setting Pacing Amplitude: 2 V
Lead Channel Setting Pacing Pulse Width: 0.4 ms
Lead Channel Setting Sensing Sensitivity: 1.2 mV

## 2021-08-11 ENCOUNTER — Ambulatory Visit (INDEPENDENT_AMBULATORY_CARE_PROVIDER_SITE_OTHER): Payer: HMO | Admitting: Pharmacist

## 2021-08-11 DIAGNOSIS — R42 Dizziness and giddiness: Secondary | ICD-10-CM

## 2021-08-11 DIAGNOSIS — J449 Chronic obstructive pulmonary disease, unspecified: Secondary | ICD-10-CM

## 2021-08-11 DIAGNOSIS — I10 Essential (primary) hypertension: Secondary | ICD-10-CM

## 2021-08-11 DIAGNOSIS — E11319 Type 2 diabetes mellitus with unspecified diabetic retinopathy without macular edema: Secondary | ICD-10-CM

## 2021-08-11 DIAGNOSIS — I2510 Atherosclerotic heart disease of native coronary artery without angina pectoris: Secondary | ICD-10-CM

## 2021-08-11 DIAGNOSIS — E785 Hyperlipidemia, unspecified: Secondary | ICD-10-CM

## 2021-08-11 DIAGNOSIS — I495 Sick sinus syndrome: Secondary | ICD-10-CM

## 2021-08-11 MED ORDER — METOPROLOL SUCCINATE ER 25 MG PO TB24: 12.5000 mg | ORAL_TABLET | Freq: Every day | ORAL | 1 refills | Status: AC

## 2021-08-11 NOTE — Chronic Care Management (AMB) (Signed)
Chronic Care Management Pharmacy Note  08/11/2021 Name:  Phillip Jordan MRN:  [id number] DOB:  18-Mar-1930  Summary:  Patient states he recently has been experiencing more numbness in his feet and hands. States he has stopped driving due to concern that he cannot feel his feet well and also having more trouble buttoning shirt. In past has had low B12 but last B12 level was >1500 05/2020. A1c has been at goal.  Plan to notify PCP and consider rechecking B12 level to rule out low B12 a cause of worsening neuropathy since metformin can decrease B12 absorption. Patient having dizziness. Recommended change from metoprolol tartrate 61m 0.5 tablets daily to longer acting metoprolol succinate ER 223m0.5 tablets daily.  Subjective: Phillip MICHELSENs an [age].o. year old male who is a primary patient of Phillip Jordan.  The CCM team was consulted for assistance with disease management and care coordination needs.    Engaged with patient by telephone for follow up visit in response to provider referral for pharmacy case management and/or care coordination services.   Consent to Services:  The patient was given information about Chronic Care Management services, agreed to services, and gave verbal consent prior to initiation of services.  Please see initial visit note for detailed documentation.   Patient Care Team: PaColon BranchMD as PCP - GeSandford Jordan as Consulting Physician (Dermatology) Phillip Jordan as Consulting Physician (Ophthalmology) WeTanda Jordan as Consulting Physician (Pulmonary Disease) Phillip Jordan as Consulting Physician (Cardiology) Phillip Jordan as Consulting Physician (Podiatry) Phillip Jordan (Pharmacist)  Recent office visits: 12/05/202 - Int Med (Phillip PaLarose Kellsfollow up chronic conditions; Labs checked. no meds changes.  04/22/21-Phillip Jordan (PCP) Seen for dizziness. Labs ordered. 02/21/21-Phillip Jordan (PCP) General follow  up visit. Labs ordered. Follow up in 5 months.  Recent consult visits: 07/14/2021 - Poditary (Phillip Phillip Jordan ulcer on planter aspect of rigth foot. Debrided area.  Continue gentamycin cream daily  [facility]2021 [facility] and excessive cerumen. No med changes.  05/12/21 (Podiatry) Phillip Phillip Haileyeen for a follow up. Follow up in 3 weeks. 04/14/21 (Podiatry)Phillip Jordan up ulceration right foot. No med changes. 04/11/21 (Pulmonology) Phillip Jordan COPD. follow up as needed. 03/24/21 (Podiatry) Phillip  Phillip HaileySeen for diabetic ulcer. Follow up in 3 weeks. 03/20/21 (Otolaryngology) NoJolene Provostngegerd Charlotta, PA-C. Seen for cerumen impaction. Follow up in 2 months. 02/25/21 (Pulm) Phillip. WeMelvyn Novas COPD follow up. No med changes. F/U in 4 weeks. 02/12/2021 - Cardio (Phillip Jordan tachy-brady syndrome with PPM insertion. Continue anticoagulation. No med changes.   Hospital visits: Pacemaker placement 11/06/2020 - no medication changes noted except continue to hold Eliquis until 11/10/2020.  Objective:  Lab Results  Component Value Date   CREATININE 0.93 07/28/2021   CREATININE 0.98 02/21/2021   CREATININE 0.92 10/11/2020    Lab Results  Component Value Date   HGBA1C 6.7 (H) 07/28/2021   Last diabetic Eye exam:  Lab Results  Component Value Date/Time   HMDIABEYEEXA Retinopathy (A) 06/16/2021 12:00 AM    Last diabetic Foot exam:  Lab Results  Component Value Date/Time   HMDIABFOOTEX Done/Abnormal 05/02/2015 12:00 AM        Component Value Date/Time   CHOL 157 10/22/2020 1521   TRIG 158.0 (H) 10/22/2020 1521   TRIG 181 09/06/2009 1602   HDL  49.00 10/22/2020 1521   CHOLHDL 3 10/22/2020 1521   VLDL 31.6 10/22/2020 1521   LDLCALC 77 10/22/2020 1521    Hepatic Function Latest Ref Rng & Units 07/28/2021 10/22/2020 12/13/2019  Total Protein 6.0 - 8.3 g/dL 6.7 - 6.8  Albumin 3.5 - 5.2 g/dL 4.3 - 4.3  AST 0 - 37 U/L _0 ALT 0 - 53 U/L _1 Alk Phosphatase 39 - 117 U/L 85 - 90  Total Bilirubin 0.2 - 1.2 mg/dL 0.5 - 0.4  Bilirubin, Direct 0.0 - 0.3 mg/dL - - -    Lab Results  Component Value Date/Time   TSH 1.96 06/19/2020 01:43 PM   TSH 1.87 02/01/2017 11:40 AM    CBC Latest Ref Rng & Units 04/22/2021 10/11/2020 08/29/2020  WBC 4.0 - 10.5 K/uL 7.2 10.3 11.6(H)  Hemoglobin 13.0 - 17.0 g/dL 14.1 15.2 15.9  Hematocrit 39.0 - 52.0 % 42.2 44.1 46.5  Platelets 150.0 - 400.0 K/uL 264.0 282 278    Lab Results  Component Value Date/Time   VD25OH 44.39 12/12/2015 04:15 PM    Clinical ASCVD: Yes  The ASCVD Risk score (Arnett DK, et al., 2019) failed to calculate for the following reasons:   The 2019 ASCVD risk score is only valid for ages 88 to 49    Other: CHADS2VASc = 5  Social History   Tobacco Use  Smoking Status Former   Packs/day: 1.50   Years: 39.00   Pack years: 58.50   Types: Cigarettes   Quit date: 08/24/1992   Years since quitting: 28.9  Smokeless Tobacco Never   BP Readings from Last 3 Encounters:  07/28/21 (!) 152/78  04/22/21 (!) 172/78  04/11/21 118/68   Pulse Readings from Last 3 Encounters:  07/28/21 70  04/22/21 65  04/11/21 60   Wt Readings from Last 3 Encounters:  07/28/21 173 lb 6 oz (78.6 kg)  04/22/21 173 lb 6 oz (78.6 kg)  04/11/21 172 lb 9.6 oz (78.3 kg)    Assessment: Review of patient past medical history, allergies, medications, health status, including review of consultants reports, laboratory and other test data, was performed as part of comprehensive evaluation and provision of chronic care management services.   SDOH:  (Social Determinants of Health) assessments and interventions performed:  SDOH Interventions    Flowsheet Row Most Recent Value  SDOH Interventions   Financial Strain Interventions Intervention Not Indicated  Transportation Interventions Intervention Not Indicated        CCM Care Plan  No Known Allergies  Medications  Reviewed Today     Reviewed by [name], RPH-CPP (Pharmacist) on 08/11/21 at 1132  Med List Status: <None>   Medication Order Taking? Sig Documenting Provider Last Dose Status Informant  albuterol (PROAIR HFA) 108 (90 Base) MCG/ACT inhaler [id number] Yes 2 puffs up to every 4 hours as needed only  if your can't catch your breath Phillip Rockers, MD Taking Active   apixaban (ELIQUIS) 5 MG TABS tablet [id number] Yes Take 1 tablet (5 mg total) by mouth 2 (two) times daily. [name], MD Taking Active Self  aspirin EC 81 MG tablet [id number] Yes Take 81 mg by mouth daily. Swallow whole. [provider] Taking Active Self  azelastine (ASTELIN) 0.1 % nasal spray [id number] Yes Place 2 sprays into both nostrils at bedtime as needed for rhinitis. Use in each nostril as directed Colon Branch, MD Taking Active Self  dextromethorphan-guaiFENesin [facility] DM) 30-600 MG 12hr tablet  [id number] Yes Take 1 tablet by mouth 2 (two) times daily as needed for cough. [provider] Taking Active   fluticasone (FLONASE) 50 MCG/ACT nasal spray [id number] Yes Place 2 sprays into both nostrils daily as needed for allergies or rhinitis. [provider] Taking Active Self  glucose blood (FREESTYLE TEST STRIPS) test strip [id number] Yes Check blood sugar no more than twice daily Colon Branch, MD Taking Active Self  isosorbide mononitrate (IMDUR) 30 MG 24 hr tablet [id number] Yes Take 1 tablet by mouth twice daily Colon Branch, MD Taking Active   metFORMIN (GLUCOPHAGE) 500 MG tablet [id number] Yes Take 1 tablet by mouth once daily with breakfast Colon Branch, MD Taking Active   metoprolol tartrate (LOPRESSOR) 25 MG tablet [id number] Yes Take 0.5 tablets (12.5 mg total) by mouth daily. [name], MD Taking Active Self  Multiple Vitamin (MULTIVITAMIN WITH MINERALS) TABS tablet [id number] Yes Take 1 tablet by mouth daily. [provider] Taking Active Self  nitroGLYCERIN (NITROSTAT) 0.4 MG SL  tablet [id number] Yes Place 1 tablet (0.4 mg total) under the tongue every 5 (five) minutes as needed for chest pain (up to 3 doses only.). [name], MD Taking Active Self           Med Note [facility], Vilma Prader D   Tue Apr 22, 2021  9:13 AM) PRN  pravastatin (PRAVACHOL) 40 MG tablet [id number] Yes Take 1 tablet by mouth once daily Colon Branch, MD Taking Active   Tiotropium Bromide-Olodaterol (STIOLTO RESPIMAT) 2.5-2.5 MCG/ACT AERS [id number] Yes Inhale 2 puffs into the lungs daily. Phillip Rockers, MD Taking Active             Patient Active Problem List   Diagnosis Date Noted   Pacemaker 02/12/2021   Opacities of both lungs present on chest x-ray 01/06/2021   Tachycardia-bradycardia syndrome ([place]) 10/11/2020   SCC (squamous cell carcinoma) 10/16/2019   Diabetic neuropathy ([place]) 05/23/2019   Hypertension, essential 09/01/2018   COPD exacerbation ([place]) 07/13/2016   Sinusitis, chronic 11/08/2015   Follow-up --------------PCP NOTES 05/02/2015   Vertigo 06/26/2014   Palpitations 08/29/2013   Hoarseness of voice 06/17/2011   Annual physical exam 05/26/2011   BUNDLE BRANCH BLOCK, RIGHT 01/10/2009   Allergic rhinitis 08/16/2007   Type 2 diabetes, controlled, with retinopathy ([place]) 06/30/2007   Hyperlipidemia 06/30/2007   CAD (coronary artery disease) 06/30/2007   Dissection of aorta ([place]) 06/30/2007   COPD GOLD III 06/30/2007    Immunization History  Administered Date(s) Administered   Fluad Quad(high Dose 65+) 05/11/2019   Influenza Split 05/26/2011, 06/01/2012   Influenza Whole 06/30/2007, 06/18/2008, 06/14/2009, 04/24/2010   Influenza, High Dose Seasonal PF 06/19/2014, 05/02/2015, 06/08/2017, 05/16/2018   Influenza,inj,Quad PF,6+ Mos 06/10/2013   Influenza-Unspecified 05/21/2016, 05/16/2020, 05/02/2021   Moderna Covid-19 Vaccine Bivalent Booster 59yr & up 07/28/2021   Moderna SARS-COV2 Booster Vaccination 06/24/2020, 01/22/2021   Moderna Sars-Covid-2 Vaccination 09/05/2019,  10/03/2019, 06/24/2020, 01/22/2021   Pneumococcal Conjugate-13 09/10/2015   Pneumococcal Polysaccharide-23 08/24/2001, 06/30/2007, 06/01/2012, 07/28/2021   Td 08/24/2005, 07/06/2016   Tdap 09/12/2005   Zoster Recombinat (Shingrix) 09/01/2018, 11/01/2018   Zoster, Live 10/13/2007    Conditions to be addressed/monitored: CAD, HTN, HLD, COPD, DMII, and tachy-brady syndrome; atrial flutter  Care Plan : General Pharmacy (Adult)  Updates made by ECherre Robins RPH-CPP since 08/11/2021 12:00 AM     Problem: HTN; HDL; atrial flutter; COPD; type 2 DM   Priority: High  Onset Date: 02/13/2021     [facility]  Goal: Disease and medication management   Start Date: 02/13/2021  Recent Progress: On track  Priority: High  Note:   Current Barriers:  Disease state and medication management to include education and care coordination  Pharmacist Clinical Goal(s):  Over the next 180 days, patient will maintain control of HTN, HDL, COPD and type 2 DM as evidenced by maintaining goals listed below  adhere to prescribed medication regimen as evidenced by fill history  through collaboration with PharmD and provider.   Interventions: 1:1 collaboration with Colon Branch, MD regarding development and update of comprehensive plan of care as evidenced by provider attestation and co-signature Inter-disciplinary care team collaboration (see longitudinal plan of care) Comprehensive medication review performed; medication list updated in electronic medical record  Hypertension Goal BP <130/80 Controlled Recent home BP readings:  Today 119/80 Ranges 115 to 128 / 75 to 85 Heart rate 70 to 80 Current regimen:  Metoprolol tartrate 53m = take 0.5 tablet (= 12.550m daily Patient reports occasional dizziness Interventions: Recommend trial of metoprolol succinate 2525mR - take 0.5 tablet (= 12.5) daily Discussed blood pressure goals  Continue to check blood pressure 1 to 2 times per week. Record and bring to  next office appointment.  Ensure daily salt intake < 2300 mg/day  Hyperlipidemia/CAD Last LDL was slightly above goal at 77; LDL goal < 70 Patient denies recent chest pain Current regimen:  Pravastatin 59m6mily Aspirin 81mg21mly Isosorbide 30mg 59me daily Nitroglycerin 0.4mg as75meded Interventions: Discussed LDL goal Recommended continue current regimen. If next LDL >70 consider increasing pravastatin to 80mg da74m Diabetes Controlled; A1c goal <7% Home BG readings range from 90 to 130 Denies hypoglycemia Exercises - walks daily about 30 minutes (using cane when walking for stabilization) Patient states he recently has experiencing more numbness in his feet and hands. States he has stopped driving due to concern that he cannot feel his feet well and also having more trouble buttoning shirt. In past has had low B12 but last B12 level was >1500 05/2020.  Diet - eats lots of vegetables; does limit sugar intake.  Current regimen:  Metformin 500mg dai37mnterventions: Discussed A1c and blood glucose  goals Continue to check blood glucose  daily and record Continue to walk daily  Will notify PCP of patient's worsening neuropathy Consider rechecking B12 level to rule out low B12 a cause of worsening neuropathy since metformin can decrease B12 absorption.  COPD (goal to decrease symptoms and exacerbations of COPD):  Current regimen:  Stiolto Respimat - inhale 2 puffs unto lungs once a day Albuterol inhaler - inhaler 2 puffs into lungs up to every 4 hours as needed for wheezing and shortness of breath 1 exacerbation in last year (12/27/2020) Using maintenance inhaler daily Reports rarely uses albuterol - maybe 1 time per week Patient reports [facility]  [place]: Continue current medication regimen for COPD  Tachy/Brady Syndrome and Atrial Flutter  Goal: reduce stroke risk Managed by Phillip Taylor - Lovena Lelogist Denies S/S of bleeding Current  regimen: Eliquis 5mg twice4mily Pacemaker placed 11/06/2020 Metoprolol tartrate 25mg -  In72mentions Discussed cost of Eliquis. [facility] [place] Continue current regimen Continue to follow up with cardiologist and device clinic as indicated.  Trial of metoprolol succinate ER 25mg - take38m tablet (=12.5mg) daily  75mication management [facility] [place] [facility]int[facility] in [facility] but patient is no  longer taking this medication.  Interventions: Comprehensive medication review performed. Continue current medication management strategy Coordinate with [facility] to make sure Rx for clopidogrel was inactivated.    Patient Goals/Self-Care Activities Over the next 180 days, patient will:  take medications as prescribed, check glucose daily , document, and provide at future appointments, check blood pressure 1 to 2 times per week, document, and provide at future appointments, and target a minimum of 150 minutes of moderate intensity exercise weekly  Follow Up Plan:  Clinical pharmacist will f/u again in 1 month to see if dizziness has improved          Medication Assistance: None required.  Patient affirms current coverage meets needs.  Patient's preferred pharmacy is:  [place], [place] [address] Phone: [phone] Fax: [phone]  Follow Up:  Patient agrees to Care Plan and Follow-up.  Plan:  Clinical pharmacist f/u in 1 month   [name], PharmD Clinical Pharmacist [place] [phone]

## 2021-08-11 NOTE — Patient Instructions (Signed)
Mr. Ehrmann It was a pleasure speaking with you today.  I have attached a summary of our visit today and information about your health goals.   Our next appointment is by telephone on January 17/2023 at 10:00am (to check to see if blood pressure as been more stable and see if dizziness has improved with change to metoprolol succinate ER 25mg  - 0.5 tablet daily.  Please call the care guide team at [phone] if you need to cancel or reschedule your appointment.   If you have any questions or concerns, please feel free to contact me either at the phone number below or with a MyChart message.   Keep up the good work!  [name], PharmD Clinical Pharmacist [facility] [phone] (direct line)  [phone] (main office number)  CARE PLAN ENTRY  Hypertension BP Readings from Last 3 Encounters:  07/28/21 (!) 152/78  04/22/21 (!) 172/78  04/11/21 118/68   Pharmacist Clinical Goal(s): Over the next 180 days, patient will work with PharmD and providers to maintain BP goal <130/80 Current regimen:  Metoprolol tartrate 25mg  = take 0.5 tablet (= 12.5mg ) daily Interventions: Discussed blood pressure goals  Patient self care activities - Over the next 180 days, patient will: Check blood pressure 1 to 2 times per week. Record and bring to next office appointment.  Ensure daily salt intake < 2300 mg/day Change to metoprolol succinate 25mg  ER - take 0.5 tablets (=12.5mg ) daily to see if dizziness improves  Hyperlipidemia/CAD Lab Results  Component Value Date/Time   LDLCALC 77 10/22/2020 03:21 PM   Pharmacist Clinical Goal(s): Over the next 180 days, patient will work with PharmD and providers to achieve LDL goal < 70 Current regimen:  Pravastatin 40mg  daily Aspirin 81mg  daily Isosorbide 30mg  twice daily Nitroglycerin 0.4mg  as needed Interventions: Discussed LDL goal Patient self care activities - Over the next 180 days, patient  will: Continue current regimen.  Recheck lipids with next labs  Diabetes Lab Results  Component Value Date/Time   HGBA1C 6.7 (H) 07/28/2021 10:50 AM   HGBA1C 6.7 (H) 02/21/2021 11:36 AM   Pharmacist Clinical Goal(s): Over the next 180 days, patient will work with PharmD and providers to maintain A1c goal <7% Current regimen:  Metformin 500mg  daily Interventions: Discussed DM goals Patient self care activities - Over the next 180 days, patient will: Maintain a1c less than 7% Consider checking B12 level due to symptoms of increased numbness and tingling in hands and feet. Notifying Dr [name] of recent symptoms.   COPD Pharmacist Clinical Goal(s): Over the next 180 days, patient will work with PharmD and providers to prevent exacerbation of COPD Current regimen:  Stiolto inhaler - inhale 2 puffs into lungs once a day Albuterol inhaler - inhaler 2 puffs into lungs up to every 4 hours as needed for wheezing and shortness of breath Interventions Discussed cost of inhalers. Patient reports affordable since he has started to get from [place] Patient self care activities - Over the next 180 days, patient will: Continue current medication regimen for COPD  Tachy/Brady Syndrome and Atrial Flutter Pharmacist Clinical Goal(s): Over the next 180 days, patient will work with PharmD and providers to continue stroke risk reduction Current regimen: Eliquis 5mg  twice daily Pacemaker placed 11/06/2020 Metoprolol tartrate 25mg  - take 0.5 tablet (=12.5mg ) daily Interventions Discussed cost of Eliquis. Patient is getting thru [place] and cost is $8/month Change to metoprolol succinate 25mg  ER - take 0.5 tablets (=12.5mg ) daily to see if dizziness improves Patient  self care activities - Over the next 180 days, patient will: Continue current regimen Continue to follow up with cardiologist and device clinic as indicated.   Medication management Pharmacist Clinical Goal(s): Over the next 180 days, patient will  work with PharmD and providers to maintain optimal medication adherence Current pharmacy: [facility] and [place] Interventions Comprehensive medication review performed. Continue current medication management strategy Called [facility] and requested clopidogrel prescription be inactivates since patient is no longer taking.  Patient self care activities - Over the next 180 days, patient will: Focus on medication adherence by filling and taking medications appropriately Take medications as prescribed Report any questions or concerns to PharmD and/or provider(s)  Please see past updates related to this goal by clicking on the "Past Updates" button in the selected goal    Patient verbalizes understanding of instructions provided today and agrees to view in [place].

## 2021-08-12 ENCOUNTER — Telehealth: Payer: Self-pay | Admitting: Internal Medicine

## 2021-08-12 MED ORDER — ALBUTEROL SULFATE HFA 108 (90 BASE) MCG/ACT IN AERS: INHALATION_SPRAY | RESPIRATORY_TRACT | 11 refills | Status: AC

## 2021-08-12 NOTE — Telephone Encounter (Signed)
I have called Phillip Jordan and she is aware that RX has been sent to pharmacy

## 2021-08-19 NOTE — Progress Notes (Signed)
Remote pacemaker transmission.   

## 2021-08-23 DIAGNOSIS — E785 Hyperlipidemia, unspecified: Secondary | ICD-10-CM

## 2021-08-23 DIAGNOSIS — I10 Essential (primary) hypertension: Secondary | ICD-10-CM | POA: Diagnosis not present

## 2021-08-23 DIAGNOSIS — I2510 Atherosclerotic heart disease of native coronary artery without angina pectoris: Secondary | ICD-10-CM

## 2021-08-23 DIAGNOSIS — J449 Chronic obstructive pulmonary disease, unspecified: Secondary | ICD-10-CM | POA: Diagnosis not present

## 2021-08-23 DIAGNOSIS — E11319 Type 2 diabetes mellitus with unspecified diabetic retinopathy without macular edema: Secondary | ICD-10-CM | POA: Diagnosis not present

## 2021-08-28 ENCOUNTER — Ambulatory Visit: Payer: HMO | Admitting: Adult Health

## 2021-08-29 ENCOUNTER — Telehealth: Payer: Self-pay

## 2021-08-29 NOTE — Telephone Encounter (Signed)
Returned wife's call, Patsy per DPR per voice mail request.  She received device clinic letter regarding remote monitoring and was asking if he will be followed monthly by ICM.  Advised that he does not need monthly ICM following and will continue to have remote monitoring every 3 months per letter.  Advised next remote transmission will be March.  She verbalized understanding and appreciated call back.

## 2021-09-01 ENCOUNTER — Other Ambulatory Visit: Payer: Self-pay

## 2021-09-01 ENCOUNTER — Encounter: Payer: Self-pay | Admitting: Acute Care

## 2021-09-01 ENCOUNTER — Ambulatory Visit (INDEPENDENT_AMBULATORY_CARE_PROVIDER_SITE_OTHER): Payer: HMO

## 2021-09-01 ENCOUNTER — Ambulatory Visit: Payer: HMO | Admitting: Acute Care

## 2021-09-01 VITALS — BP 138/70 | HR 69 | Temp 97.6°F | Ht 69.0 in | Wt 169.2 lb

## 2021-09-01 DIAGNOSIS — Z87891 Personal history of nicotine dependence: Secondary | ICD-10-CM | POA: Diagnosis not present

## 2021-09-01 DIAGNOSIS — J45909 Unspecified asthma, uncomplicated: Secondary | ICD-10-CM | POA: Diagnosis not present

## 2021-09-01 DIAGNOSIS — J441 Chronic obstructive pulmonary disease with (acute) exacerbation: Secondary | ICD-10-CM

## 2021-09-01 LAB — POCT EXHALED NITRIC OXIDE: FeNO level (ppb): 43

## 2021-09-01 MED ORDER — PREDNISONE 10 MG PO TABS: ORAL_TABLET | ORAL | 0 refills | Status: DC

## 2021-09-01 NOTE — Progress Notes (Signed)
History of Present Illness Phillip Jordan is a 88 y.o. male  former smoker (1ppd x 30 years, quit 1990s), diagnosed with COPD per hx by Dr Phillip Jordan (fev1 1.38L/44%, ratio 51 in Jan 2009). C/w GOLD III  Criteria . He is now followed by Dr. Melvyn Jordan.  Last OV 03/2021 Stiolto Albuterol rescue  Mucinex DM Flonase nasal spray Astelin nasal spray    09/01/2021 Pt. Present s for follow up. He called the office last week with wheezing and a flare of his COPD/ Asthma. He states he is better now, but is still wheezing. He denies fever, no swelling of his ankles of legs. He has had all of his vaccines and boosters. Secretions are clear. Albuterol rescue use last week was high, but this week he has not needed it as much. He is audibly wheezing this morning. He would like a prednisone taper.  He needs to be reassigned to a physician who is in this office as he lives close to the [employer]   Test Results: 09/01/2021: FENO>> 43 ppb  CBC Latest Ref Rng & Units 04/22/2021 10/11/2020 08/29/2020  WBC 4.0 - 10.5 K/uL 7.2 10.3 11.6(H)  Hemoglobin 13.0 - 17.0 g/dL 14.1 15.2 15.9  Hematocrit 39.0 - 52.0 % 42.2 44.1 46.5  Platelets 150.0 - 400.0 K/uL 264.0 282 278    BMP Latest Ref Rng & Units 07/28/2021 02/21/2021 10/11/2020  Glucose 70 - 99 mg/dL 125(H) 126(H) 111(H)  BUN 6 - 23 mg/dL 12 16 14   Creatinine 0.40 - 1.50 mg/dL 0.93 0.98 0.92  BUN/Creat Ratio 10 - 24 - - 15  Sodium 135 - 145 mEq/L 133(L) 135 134  Potassium 3.5 - 5.1 mEq/L 4.6 4.5 4.8  Chloride 96 - 112 mEq/L 95(L) 97 94(L)  CO2 19 - 32 mEq/L 31 30 22   Calcium 8.4 - 10.5 mg/dL 9.6 9.6 9.6    BNP No results found for: BNP  ProBNP    Component Value Date/Time   PROBNP 64.5 11/01/2008 0010    PFT    Component Value Date/Time   FEV1PRE 0.93 05/21/2016 1052   FEV1POST 1.23 05/21/2016 1052   FVCPRE 2.22 05/21/2016 1052   FVCPOST 2.74 05/21/2016 1052   TLC 6.56 05/21/2016 1052   DLCOUNC 16.84 05/21/2016 1052   PREFEV1FVCRT 42  05/21/2016 1052   PSTFEV1FVCRT 45 05/21/2016 1052    CUP PACEART REMOTE DEVICE CHECK  Result Date: 08/06/2021 Scheduled remote reviewed. Normal device function.  Next remote 91 days. LR    Past medical hx Past Medical History:  Diagnosis Date   Allergic rhinitis    Aortic dissection ([place])    f/u by cards - AAA with distal dissection.   CAD (coronary artery disease)    a. s/p MI 1992. b. Prior hx stenting to RCA/Cx. b. 05/2013: [place] s/p DES to distal RCA then staged DES to prox LAD.    COPD with asthma ([place])    Diabetes mellitus    Hyperlipidemia    Lichen planus    Right midline inferior chest, Dr. [name] Sensing   SCC (squamous cell carcinoma)    Shingles      Social History   Tobacco Use   Smoking status: Former    Packs/day: 1.50    Years: 39.00    Pack years: 58.50    Types: Cigarettes    Quit date: 08/24/1992    Years since quitting: 29.0   Smokeless tobacco: Never  Vaping Use   Vaping Use: Never used  Substance Use Topics   Alcohol use: No   Drug use: No    Mr.Phillip Jordan reports that he quit smoking about 29 years ago. His smoking use included cigarettes. He has a 58.50 pack-year smoking history. He has never used smokeless tobacco. He reports that he does not drink alcohol and does not use drugs.  Tobacco Cessation: Former smoker   Past surgical hx, Family hx, Social hx all reviewed.  Current Outpatient Medications on File Prior to Visit  Medication Sig   albuterol (PROAIR HFA) 108 (90 Base) MCG/ACT inhaler 2 puffs up to every 4 hours as needed only  if your can't catch your breath   apixaban (ELIQUIS) 5 MG TABS tablet Take 1 tablet (5 mg total) by mouth 2 (two) times daily.   aspirin EC 81 MG tablet Take 81 mg by mouth daily. Swallow whole.   azelastine (ASTELIN) 0.1 % nasal spray Place 2 sprays into both nostrils at bedtime as needed for rhinitis. Use in each nostril as directed   dextromethorphan-guaiFENesin (MUCINEX DM) 30-600 MG 12hr tablet Take 1 tablet  by mouth 2 (two) times daily as needed for cough.   fluticasone (FLONASE) 50 MCG/ACT nasal spray Place 2 sprays into both nostrils daily as needed for allergies or rhinitis.   glucose blood (FREESTYLE TEST STRIPS) test strip Check blood sugar no more than twice daily   isosorbide mononitrate (IMDUR) 30 MG 24 hr tablet Take 1 tablet by mouth twice daily   metFORMIN (GLUCOPHAGE) 500 MG tablet Take 1 tablet by mouth once daily with breakfast   metoprolol succinate (TOPROL-XL) 25 MG 24 hr tablet Take 0.5 tablets (12.5 mg total) by mouth daily. (Replaces metoprolol TARTRATE)   Multiple Vitamin (MULTIVITAMIN WITH MINERALS) TABS tablet Take 1 tablet by mouth daily.   nitroGLYCERIN (NITROSTAT) 0.4 MG SL tablet Place 1 tablet (0.4 mg total) under the tongue every 5 (five) minutes as needed for chest pain (up to 3 doses only.).   pravastatin (PRAVACHOL) 40 MG tablet Take 1 tablet by mouth once daily   Tiotropium Bromide-Olodaterol (STIOLTO RESPIMAT) 2.5-2.5 MCG/ACT AERS Inhale 2 puffs into the lungs daily.   No current facility-administered medications on file prior to visit.     No Known Allergies  Review Of Systems:  Constitutional:   No  weight loss, night sweats,  Fevers, chills, fatigue, or  lassitude.  HEENT:   No headaches,  Difficulty swallowing,  Tooth/dental problems, or  Sore throat,                No sneezing, itching, ear ache, nasal congestion, post nasal drip,   CV:  No chest pain,  Orthopnea, PND, swelling in lower extremities, anasarca, dizziness, palpitations, syncope.   GI  No heartburn, indigestion, abdominal pain, nausea, vomiting, diarrhea, change in bowel habits, loss of appetite, bloody stools.   Resp: No shortness of breath with exertion or at rest.  No excess mucus, + productive cough,  No non-productive cough,  No coughing up of blood.  No change in color of mucus.  + wheezing.  No chest wall deformity  Skin: no rash or lesions.  GU: no dysuria, change in color of  urine, no urgency or frequency.  No flank pain, no hematuria   MS:  No joint pain or swelling.  No decreased range of motion.  No back pain.  Psych:  No change in mood or affect. No depression or anxiety.  No memory loss.   Vital Signs BP 138/70 (BP Location: Left Arm,  Cuff Size: Normal)    Pulse 69    Temp 97.6 F (36.4 C) (Oral)    Ht 5\' 9"  (1.753 m)    Wt 169 lb 3.2 oz (76.7 kg)    SpO2 93%    BMI 24.99 kg/m    Physical Exam:  General- No distress,  A&Ox3, pleasant ENT: No sinus tenderness, TM clear, pale nasal mucosa, no oral exudate,no post nasal drip, no LAN Cardiac: S1, S2, regular rate and rhythm, no murmur Chest: + wheeze/ no rales/ dullness; no accessory muscle use, no nasal flaring, no sternal retractions Abd.: Soft Non-tender, ND, BS +, Body mass index is 24.99 kg/m.  Ext: No clubbing cyanosis, edema Neuro:  normal strength, MAE x 4, A&O x 3, appropriate  Skin: No rashes, warm and dry, no lesions  Psych: normal mood and behavior   Assessment/Plan Asthma/ COPD Flare Plan We will do a CXR.  We will call you with the results We will send in a prescription for a prednisone taper. Prednisone taper; 10 mg tablets: 4 tabs x 2 days, 3 tabs x 2 days, 2 tabs x 2 days 1 tab x 2 days then stop.  Take in the morning after breakfast until gone. Continue Stiolto daily as you have been doing Albuterol for rescue as needed for breakthrough wheezing or shortness of breath.  Follow up in 2 weeks with Judson Roch NP or Dr. Melvyn Jordan to make sure you are better.  If you get worse not better, please call to be seen sooner or seek Emergency care.  Please contact office for sooner follow up if symptoms do not improve or worsen or seek emergency care    I spent 40 minutes dedicated to the care of this patient on the date of this encounter to include pre-visit review of records, face-to-face time with the patient discussing conditions above, post visit ordering of testing, clinical documentation with  the electronic health record, making appropriate referrals as documented, and communicating necessary information to the patient's healthcare team.     Magdalen Spatz, NP 09/01/2021  10:57 AM

## 2021-09-01 NOTE — Patient Instructions (Addendum)
It is good to see you today. We will do a CXR.  We will call you with the results We will send in a prescription for a prednisone taper. Prednisone taper; 10 mg tablets: 4 tabs x 2 days, 3 tabs x 2 days, 2 tabs x 2 days 1 tab x 2 days then stop.  Take in the morning after breakfast until gone. Continue Stiolto daily as you have been doing Albuterol for rescue as needed for breakthrough wheezing or shortness of breath.  Follow up in 2 weeks with Judson Roch NP or Dr. [name] to make sure you are better.  If you get worse not better, please call to be seen sooner or seek Emergency care.  Please contact office for sooner follow up if symptoms do not improve or worsen or seek emergency care

## 2021-09-09 ENCOUNTER — Ambulatory Visit (INDEPENDENT_AMBULATORY_CARE_PROVIDER_SITE_OTHER): Payer: HMO | Admitting: Pharmacist

## 2021-09-09 DIAGNOSIS — E1142 Type 2 diabetes mellitus with diabetic polyneuropathy: Secondary | ICD-10-CM

## 2021-09-09 DIAGNOSIS — I495 Sick sinus syndrome: Secondary | ICD-10-CM

## 2021-09-09 DIAGNOSIS — R42 Dizziness and giddiness: Secondary | ICD-10-CM

## 2021-09-09 DIAGNOSIS — Z79899 Other long term (current) drug therapy: Secondary | ICD-10-CM

## 2021-09-09 DIAGNOSIS — E785 Hyperlipidemia, unspecified: Secondary | ICD-10-CM

## 2021-09-09 DIAGNOSIS — I2510 Atherosclerotic heart disease of native coronary artery without angina pectoris: Secondary | ICD-10-CM

## 2021-09-09 DIAGNOSIS — J449 Chronic obstructive pulmonary disease, unspecified: Secondary | ICD-10-CM

## 2021-09-09 DIAGNOSIS — E11319 Type 2 diabetes mellitus with unspecified diabetic retinopathy without macular edema: Secondary | ICD-10-CM

## 2021-09-10 NOTE — Patient Instructions (Signed)
Phillip Jordan,  It was a pleasure speaking with you today.  I have attached a summary of our visit today and information about your health goals.   Our next appointment is by telephone on Dec 22, 2021 at 1pm  Please call the care guide team at [phone] if you need to cancel or reschedule your appointment.   If you have any questions or concerns, please feel free to contact me either at the phone number below or with a MyChart message.   Keep up the good work!  [name], PharmD Clinical Pharmacist [place] [phone] (direct line)  [phone] (main office number)  CARE PLAN ENTRY (see longitudinal plan of care for additional care plan information)  Current Barriers:  Chronic Disease Management support, education, and care coordination needs related to COPD, DM, HTN, HLD, CAD, Vertigo  Hypertension BP Readings from Last 3 Encounters:  09/01/21 138/70  07/28/21 (!) 152/78  04/22/21 (!) 172/78   Pharmacist Clinical Goal(s): Over the next 180 days, patient will work with PharmD and providers to maintain BP goal <130/80 Current regimen:  Metoprolol succinate 25mg  - take 0.5 tablet (=12.5mg ) daily Interventions: Discussed blood pressure goals  Patient self care activities - Over the next 180 days, patient will: Check blood pressure 1 to 2 times per week. Record and bring to next office appointment.  Ensure daily salt intake < 2300 mg/day  Hyperlipidemia/CAD Lab Results  Component Value Date/Time   LDLCALC 77 10/22/2020 03:21 PM   Pharmacist Clinical Goal(s): Over the next 180 days, patient will work with PharmD and providers to achieve LDL goal < 70 Current regimen:  Pravastatin 40mg  daily Aspirin 81mg  daily Isosorbide 30mg  twice daily Nitroglycerin 0.4mg  as needed Interventions: Discussed LDL goal Patient self care activities - Over the next 180 days, patient will: Continue current regimen.  Recheck lipids with next  labs  Diabetes Lab Results  Component Value Date/Time   HGBA1C 6.7 (H) 07/28/2021 10:50 AM   HGBA1C 6.7 (H) 02/21/2021 11:36 AM   Pharmacist Clinical Goal(s): Over the next 180 days, patient will work with PharmD and providers to maintain A1c goal <7% Current regimen:  Metformin 500mg  daily Interventions: Discussed DM goals Patient self care activities - Over the next 180 days, patient will: Maintain a1c less than 7% Ordered serum B12 level   COPD Pharmacist Clinical Goal(s): Over the next 180 days, patient will work with PharmD and providers to prevent exacerbation of COPD Current regimen:  Stiolto inhaler - inhale 2 puffs into lungs once a day Albuterol inhaler - inhaler 2 puffs into lungs up to every 4 hours as needed for wheezing and shortness of breath Interventions Discussed cost of inhalers. Patient reports affordable since he has started to get from [place] Patient self care activities - Over the next 180 days, patient will: Continue current medication regimen for COPD Continue to follow up with pulmonology office.   Tachy/Brady Syndrome and Atrial Flutter Pharmacist Clinical Goal(s): Over the next 180 days, patient will work with PharmD and providers to continue stroke risk reduction Current regimen: Eliquis 5mg  twice daily Pacemaker placed 11/06/2020 Metoprolol succinate 25mg  - take 0.5 tablet (=12.5mg ) daily Interventions Discussed cost of Eliquis. Patient is getting thru [place] and cost is $8/month Patient self care activities - Over the next 180 days, patient will: Continue current regimen Continue to follow up with cardiologist and device clinic as indicated.   Medication management Pharmacist Clinical Goal(s): Over the next 180 days, patient will work  with PharmD and providers to maintain optimal medication adherence Current pharmacy: [facility] and [place] Interventions Comprehensive medication review performed. Continue current medication  management strategy Patient self care activities - Over the next 180 days, patient will: Focus on medication adherence by filling and taking medications appropriately Take medications as prescribed Report any questions or concerns to PharmD and/or provider(s)  Patient verbalizes understanding of instructions and care plan provided today and agrees to view in [place]. Active MyChart status confirmed with patient.

## 2021-09-10 NOTE — Chronic Care Management (AMB) (Signed)
Chronic Care Management Pharmacy Note  09/10/2021 Name:  Phillip Jordan MRN:  [id number] DOB:  [birth year]  Summary:  Patient states he recently has been experiencing more numbness in his feet and hands. States he has stopped driving due to concern that he cannot feel his feet well and also having more trouble buttoning shirt. In past has had low B12 but last B12 level was >1500 05/2020. A1c has been at goal.  Ordered future B12 level to rule out low B12 a cause of worsening neuropathy since metformin can decrease B12 absorption. Metoprolol tartrate changed to extended release succinate at last visit to see if dizziness would improve and blood pressure stabilize. Patient reports blood pressure is better but still has occasional dizziness - no change from previous.  Discussed rising slowly. Continue metoprolol succinate ER 77m - take 0.5 talbet daily.  Subjective: Phillip COOTSis an 88y.o. year old male who is a primary patient of [name], JAlda Berthold MD.  The CCM team was consulted for assistance with disease management and care coordination needs.    Engaged with patient by telephone for follow up visit in response to provider referral for pharmacy case management and/or care coordination services.   Consent to Services:  The patient was given information about Chronic Care Management services, agreed to services, and gave verbal consent prior to initiation of services.  Please see initial visit note for detailed documentation.   Patient Care Team: PColon Branch MD as PCP - GSandford Craze MD as Consulting Physician (Dermatology) LKaty Apo MD as Consulting Physician (Ophthalmology) WTanda Rockers MD as Consulting Physician (Pulmonary Disease) NJosue Hector MD as Consulting Physician (Cardiology) MGardiner Barefoot DPM as Consulting Physician (Podiatry) ECherre Robins RPH-CPP (Pharmacist)  Recent office visits: 12/05/202 - Int Med (Dr PLarose Kells follow up chronic conditions;  Labs checked. no meds changes.  04/22/21-Jose ELadona Horns MD (PCP) Seen for dizziness. Labs ordered. 02/21/21-Jose ELadona Horns MD (PCP) General follow up visit. Labs ordered. Follow up in 5 months.  Recent consult visits: 09/01/2021 - Pulmonary (Elie Confer NP) Seen for COPD follow up. Noted audible wheezing. Prednisone taper x 8 days prescribed. Chest xray reviewed. 07/14/2021 - Poditary (Dr EAmalia Hailey F/U ulcer on planter aspect of rigth foot. Debrided area.  Continue gentamycin cream daily  05/26/2021 -ENT (Dr NOlena Leatherwood[facility] Seen for presbycusis of both ears and excessive cerumen. No med changes.  05/12/21 (Podiatry) Dr EAmalia HaileySeen for a follow up. Follow up in 3 weeks. 04/14/21 (Podiatry)Dr ERolley Sims follow up ulceration right foot. No med changes. 04/11/21 (Pulmonology) Dr WBetsy Priesfor COPD. follow up as needed. 03/24/21 (Podiatry) Dr  EAmalia Hailey Seen for diabetic ulcer. Follow up in 3 weeks. 03/20/21 (Otolaryngology) NJolene ProvostIngegerd Charlotta, PA-C. Seen for cerumen impaction. Follow up in 2 months.  Hospital visits: Pacemaker placement 11/06/2020 - no medication changes noted except continue to hold Eliquis until 11/10/2020.  Objective:  Lab Results  Component Value Date   CREATININE 0.93 07/28/2021   CREATININE 0.98 02/21/2021   CREATININE 0.92 10/11/2020    Lab Results  Component Value Date   HGBA1C 6.7 (H) 07/28/2021   Last diabetic Eye exam:  Lab Results  Component Value Date/Time   HMDIABEYEEXA Retinopathy (A) 06/16/2021 12:00 AM    Last diabetic Foot exam:  Lab Results  Component Value Date/Time   HMDIABFOOTEX Done/Abnormal 05/02/2015 12:00 AM        Component Value Date/Time   CHOL 157 10/22/2020 1521   TRIG 158.0 (  H) 10/22/2020 1521   TRIG 181 09/06/2009 1602   HDL 49.00 10/22/2020 1521   CHOLHDL 3 10/22/2020 1521   VLDL 31.6 10/22/2020 1521   LDLCALC 77 10/22/2020 1521    Hepatic Function Latest Ref Rng & Units 07/28/2021 10/22/2020 12/13/2019  Total  Protein 6.0 - 8.3 g/dL 6.7 - 6.8  Albumin 3.5 - 5.2 g/dL 4.3 - 4.3  AST 0 - 37 U/L _0 ALT 0 - 53 U/L _1 Alk Phosphatase 39 - 117 U/L 85 - 90  Total Bilirubin 0.2 - 1.2 mg/dL 0.5 - 0.4  Bilirubin, Direct 0.0 - 0.3 mg/dL - - -    Lab Results  Component Value Date/Time   TSH 1.96 06/19/2020 01:43 PM   TSH 1.87 02/01/2017 11:40 AM    CBC Latest Ref Rng & Units 04/22/2021 10/11/2020 08/29/2020  WBC 4.0 - 10.5 K/uL 7.2 10.3 11.6(H)  Hemoglobin 13.0 - 17.0 g/dL 14.1 15.2 15.9  Hematocrit 39.0 - 52.0 % 42.2 44.1 46.5  Platelets 150.0 - 400.0 K/uL 264.0 282 278    Lab Results  Component Value Date/Time   VD25OH 44.39 12/12/2015 04:15 PM    Clinical ASCVD: Yes  The ASCVD Risk score (Arnett DK, et al., 2019) failed to calculate for the following reasons:   The 2019 ASCVD risk score is only valid for ages 52 to 72    Other: CHADS2VASc = 5  Social History   Tobacco Use  Smoking Status Former   Packs/day: 1.50   Years: 39.00   Pack years: 58.50   Types: Cigarettes   Quit date: 08/24/1992   Years since quitting: 29.0  Smokeless Tobacco Never   BP Readings from Last 3 Encounters:  09/01/21 138/70  07/28/21 (!) 152/78  04/22/21 (!) 172/78   Pulse Readings from Last 3 Encounters:  09/01/21 69  07/28/21 70  04/22/21 65   Wt Readings from Last 3 Encounters:  09/01/21 169 lb 3.2 oz (76.7 kg)  07/28/21 173 lb 6 oz (78.6 kg)  04/22/21 173 lb 6 oz (78.6 kg)    Assessment: Review of patient past medical history, allergies, medications, health status, including review of consultants reports, laboratory and other test data, was performed as part of comprehensive evaluation and provision of chronic care management services.   SDOH:  (Social Determinants of Health) assessments and interventions performed:      CCM Care Plan  No Known Allergies  Medications Reviewed Today     Reviewed by [name], RPH-CPP (Pharmacist) on 09/10/21 at [phone]  Med List Status:  <None>   Medication Order Taking? Sig Documenting Provider Last Dose Status Informant  albuterol (PROAIR HFA) 108 (90 Base) MCG/ACT inhaler [id number] Yes 2 puffs up to every 4 hours as needed only  if your can't catch your breath [name], MD Taking Active   apixaban (ELIQUIS) 5 MG TABS tablet [id number] Yes Take 1 tablet (5 mg total) by mouth 2 (two) times daily. [name], MD Taking Active Self           Med Note Antony Contras, [place] B   Tue Sep 09, 2021 10:14 AM) Geralynn Ochs through [place]  aspirin EC 81 MG tablet [id number] Yes Take 81 mg by mouth daily. Swallow whole. [provider] Taking Active Self  azelastine (ASTELIN) 0.1 % nasal spray [id number] Yes Place 2 sprays into both nostrils at bedtime as needed for rhinitis. Use in each nostril as directed Colon Branch, MD Taking  Active Self  dextromethorphan-guaiFENesin (MUCINEX DM) 30-600 MG 12hr tablet [id number] Yes Take 1 tablet by mouth 2 (two) times daily as needed for cough. [provider] Taking Active   fluticasone (FLONASE) 50 MCG/ACT nasal spray [id number] Yes Place 2 sprays into both nostrils daily as needed for allergies or rhinitis. [provider] Taking Active Self  glucose blood (FREESTYLE TEST STRIPS) test strip [id number] Yes Check blood sugar no more than twice daily Colon Branch, MD Taking Active Self  isosorbide mononitrate (IMDUR) 30 MG 24 hr tablet [id number] Yes Take 1 tablet by mouth twice daily Colon Branch, MD Taking Active   metFORMIN (GLUCOPHAGE) 500 MG tablet [id number] Yes Take 1 tablet by mouth once daily with breakfast Colon Branch, MD Taking Active   metoprolol succinate (TOPROL-XL) 25 MG 24 hr tablet [id number] Yes Take 0.5 tablets (12.5 mg total) by mouth daily. (Replaces metoprolol TARTRATE) Colon Branch, MD Taking Active   Multiple Vitamin (MULTIVITAMIN WITH MINERALS) TABS tablet [id number] Yes Take 1 tablet by mouth daily. [provider] Taking Active Self   nitroGLYCERIN (NITROSTAT) 0.4 MG SL tablet [id number] Yes Place 1 tablet (0.4 mg total) under the tongue every 5 (five) minutes as needed for chest pain (up to 3 doses only.). [name], MD Taking Active Self           Med Note [facility], Vilma Prader D   Tue Apr 22, 2021  9:13 AM) PRN  pravastatin (PRAVACHOL) 40 MG tablet [id number] Yes Take 1 tablet by mouth once daily Colon Branch, MD Taking Active   Tiotropium Bromide-Olodaterol (STIOLTO RESPIMAT) 2.5-2.5 MCG/ACT AERS [id number] Yes Inhale 2 puffs into the lungs daily. [name], MD Taking Active            Med Note [facility], Sandre Kitty   Tue Sep 09, 2021 10:13 AM) Lupita Shutter by [facility]            Patient Active Problem List   Diagnosis Date Noted   Pacemaker 02/12/2021   Opacities of both lungs present on chest x-ray 01/06/2021   Tachycardia-bradycardia syndrome ([place]) 10/11/2020   SCC (squamous cell carcinoma) 10/16/2019   Diabetic neuropathy ([place]) 05/23/2019   Hypertension, essential 09/01/2018   COPD exacerbation ([place]) 07/13/2016   Sinusitis, chronic 11/08/2015   Follow-up --------------PCP NOTES 05/02/2015   Vertigo 06/26/2014   Palpitations 08/29/2013   Hoarseness of voice 06/17/2011   Annual physical exam 05/26/2011   BUNDLE BRANCH BLOCK, RIGHT 01/10/2009   Allergic rhinitis 08/16/2007   Type 2 diabetes, controlled, with retinopathy ([place]) 06/30/2007   Hyperlipidemia 06/30/2007   CAD (coronary artery disease) 06/30/2007   Dissection of aorta ([place]) 06/30/2007   COPD GOLD III 06/30/2007    Immunization History  Administered Date(s) Administered   Fluad Quad(high Dose 65+) 05/11/2019   Influenza Split 05/26/2011, 06/01/2012   Influenza Whole 06/30/2007, 06/18/2008, 06/14/2009, 04/24/2010   Influenza, High Dose Seasonal PF 06/19/2014, 05/02/2015, 06/08/2017, 05/16/2018   Influenza,inj,Quad PF,6+ Mos 06/10/2013   Influenza-Unspecified 05/21/2016, 05/16/2020, 05/02/2021   Moderna Covid-19 Vaccine Bivalent Booster  27yr & up 07/28/2021   Moderna SARS-COV2 Booster Vaccination 06/24/2020, 01/22/2021   Moderna Sars-Covid-2 Vaccination 09/05/2019, 10/03/2019, 06/24/2020, 01/22/2021   Pneumococcal Conjugate-13 09/10/2015   Pneumococcal Polysaccharide-23 08/24/2001, 06/30/2007, 06/01/2012, 07/28/2021   Td 08/24/2005, 07/06/2016   Tdap 09/12/2005   Zoster Recombinat (Shingrix) 09/01/2018, 11/01/2018   Zoster, Live 10/13/2007    Conditions to be addressed/monitored: CAD, HTN, HLD, COPD, DMII, and tachy-brady syndrome; atrial flutter  Care  Plan : General Pharmacy (Adult)  Updates made by [name], RPH-CPP since 09/10/2021 12:00 AM     Problem: HTN; HDL; atrial flutter; COPD; type 2 DM   Priority: High  Onset Date: 02/13/2021     Long-Range Goal: Disease and medication management   Start Date: 02/13/2021  Recent Progress: On track  Priority: High  Note:   Current Barriers:  Disease state and medication management to include education and care coordination  Pharmacist Clinical Goal(s):  Over the next 180 days, patient will maintain control of HTN, HDL, COPD and type 2 DM as evidenced by maintaining goals listed below  adhere to prescribed medication regimen as evidenced by fill history  through collaboration with PharmD and provider.   Interventions: 1:1 collaboration with Colon Branch, MD regarding development and update of comprehensive plan of care as evidenced by provider attestation and co-signature Inter-disciplinary care team collaboration (see longitudinal plan of care) Comprehensive medication review performed; medication list updated in electronic medical record  Hypertension Goal BP <130/80 Controlled Recent home BP readings:  Today 119/80 Ranges 115 to 128 / 75 to 85 Heart rate 70 to 80 Current regimen:  Metoprolol succinate 13m = take 0.5 tablet (= 12.574m daily Changed from metoprolol tartrate to succinate at last Chronic Care Management visit to see if blood pressure  would stablize and if occasional dizziness would improve. Patient's blood pressure has been more stable but he reports not much change in dizziness (though his wife states she feels that he is not mentioning dizziness as much lately)  Interventions: Continue metoprolol succinate 2577mR - take 0.5 tablet (= 12.5) daily Discussed blood pressure goals  Continue to check blood pressure 1 to 2 times per week. Record and bring to next office appointment.  Ensure daily salt intake < 2300 mg/day  Hyperlipidemia/CAD Last LDL was slightly above goal at 77; LDL goal < 70 Patient denies recent chest pain Current regimen:  Pravastatin 61m39mily Aspirin 81mg53mly Isosorbide 30mg 59me daily Nitroglycerin 0.4mg as12meded Interventions: (addressed at previous visit) Discussed LDL goal Recommended continue current regimen. If next LDL >70 consider increasing pravastatin to 80mg da50m Diabetes Controlled; A1c goal <7% Home BG readings range from 90 to 130 Denies hypoglycemia Exercises - walks daily about 30 minutes (using cane when walking for stabilization) Patient states he recently has experiencing more numbness in his feet and hands. States he has stopped driving due to concern that he cannot feel his feet well and also having more trouble buttoning shirt. In past has had low B12 but last B12 level was >1500 05/2020.  Diet - eats lots of vegetables; does limit sugar intake.  Current regimen:  Metformin 500mg dai68mnterventions: Discussed A1c and blood glucose  goals Continue to check blood glucose  daily and record Continue to walk daily  Ordered future B12 level to rule out low B12 as cause of worsening neuropathy since metformin can decrease B12 absorption.  COPD (goal to decrease symptoms and exacerbations of COPD):  Current regimen:  Stiolto Respimat - inhale 2 puffs unto lungs once a day Albuterol inhaler - inhaler 2 puffs into lungs up to every 4 hours as needed for wheezing and  shortness of breath 2 exacerbation in last year (12/27/2020) and recently in January 2023 was seen by pulmonology office for COPD exacerbation. Treated outpatient.  Using maintenance inhaler daily Reports rarely uses albuterol - maybe 1 or 2 times per week Patient reports Stiolto is affordable - [facility]  Interventions: Continue current medication regimen for COPD  Tachy/Brady Syndrome and Atrial Flutter  Goal: reduce stroke risk Managed by Dr [name] - cardiologist Denies S/S of bleeding Current regimen: Eliquis 70m twice daily Pacemaker placed 11/06/2020 Metoprolol succinate 2812m- take 0.5 tablet (=12.12m42mdaily Interventions Discussed cost [place]. [facility] [place] [facility] [place] [facility] [place] Continue current regimen Continue to follow up with cardiologist and device clinic as indicated.   [facility] Comprehensive medication review performed. Continue current medication management strategy   Patient Goals/Self-Care Activities Over the next 180 days, patient will:  take medications as prescribed, check glucose daily , document, and provide at future appointments, check blood pressure 1 to 2 times per week, document, and provide at future appointments, and target a minimum of 150 minutes of moderate intensity exercise weekly  Follow Up Plan:  Clinical pharmacist will f/u again in 2 to 3 months          Medication Assistance: None required.  Patient affirms current coverage meets needs.  [id number]: [phone]: [phone] Up:  Patient agrees to Care Plan and Follow-up.  Plan:  Clinical pharmacist f/u in 3 months   TamCherre RobinsharmD Clinical Pharmacist [phone]

## 2021-09-17 ENCOUNTER — Other Ambulatory Visit: Payer: Self-pay

## 2021-09-17 ENCOUNTER — Encounter: Payer: Self-pay | Admitting: Acute Care

## 2021-09-17 ENCOUNTER — Ambulatory Visit: Payer: HMO | Admitting: Acute Care

## 2021-09-17 VITALS — BP 140/60 | HR 69 | Temp 97.7°F | Ht 69.0 in | Wt 170.4 lb

## 2021-09-17 DIAGNOSIS — J441 Chronic obstructive pulmonary disease with (acute) exacerbation: Secondary | ICD-10-CM

## 2021-09-17 LAB — NITRIC OXIDE: FeNO level (ppb): 52

## 2021-09-17 MED ORDER — TRELEGY ELLIPTA 100-62.5-25 MCG/ACT IN AEPB: 1.0000 | INHALATION_SPRAY | Freq: Every day | RESPIRATORY_TRACT | 0 refills | Status: AC

## 2021-09-17 NOTE — Progress Notes (Signed)
History of Present Illness Phillip Jordan is a 88 y.o.  male former smoker (1ppd x 30 years, quit 1990s), diagnosed with COPD per hx by Dr Phillip Jordan (fev1 1.38L/44%, ratio 51 in Jan 2009). C/w GOLD III  Criteria . He is now followed by Phillip Jordan.   Maintence: Stiolto Albuterol rescue  Mucinex DM Flonase nasal spray Astelin nasal spray  09/17/2021>> Will do a  therapeutic trial of Trelegy   09/17/2021 Follow up after COPD Flare Pt. Presents for follow up. He was last seen in the office 09/01/2021 for COPD flare . He stated he had gotten better , but he was wheezing , and  FENO done that day was 43 PPB. CXR was done which showed Changes of chronic fibrosis without evidence of superimposed acute cardiopulmonary disease. Patient was treated with Prednisone taper, and continued on his maintenance inhalers.  He states he has been doing well. He states he felt better after prednisone, but his FENO remains elevated today I the office. He is wheezing on exam. He uses his rescue when his wife reminds him to do so as  he is hard of hearig and cannot hear himself wheeze.  He had been on Advair in the past. This was changed to [facility] by Phillip Jordan several months ago. The patient states he can not tell much difference, but he did have a flare, and now has continued airway inflammation per FENO and wheezing on exam.  He denies any fever, chest pain, orthopnea or hemoptysis. He is a very pleasant man.   Test Results: 09/01/2021 FENO>>  43 PPB 09/17/2021 FENO >> 53 PPB  09/01/2021>> DG Chest   No significant difference in the reticular pattern opacities at the lung base, greater on the left than the right. No increasing pattern of reticular opacities, with no new confluent airspace disease.   No pleural effusion or pneumothorax. Evidence of prior PTC I/coronary disease  No pneumothorax or pleural effusion. Coarsened interstitial markings, with no confluent airspace disease. No acute displaced fracture.  Degenerative changes of the spine. IMPRESSION: Changes of chronic fibrosis without evidence of superimposed acute cardiopulmonary disease     CBC Latest Ref Rng & Units 04/22/2021 10/11/2020 08/29/2020  WBC 4.0 - 10.5 K/uL 7.2 10.3 11.6(H)  Hemoglobin 13.0 - 17.0 g/dL 14.1 15.2 15.9  Hematocrit 39.0 - 52.0 % 42.2 44.1 46.5  Platelets 150.0 - 400.0 K/uL 264.0 282 278    BMP Latest Ref Rng & Units 07/28/2021 02/21/2021 10/11/2020  Glucose 70 - 99 mg/dL 125(H) 126(H) 111(H)  BUN 6 - 23 mg/dL '12 16 14  ' Creatinine 0.40 - 1.50 mg/dL 0.93 0.98 0.92  BUN/Creat Ratio 10 - 24 - - 15  Sodium 135 - 145 mEq/L 133(L) 135 134  Potassium 3.5 - 5.1 mEq/L 4.6 4.5 4.8  Chloride 96 - 112 mEq/L 95(L) 97 94(L)  CO2 19 - 32 mEq/L '31 30 22  ' Calcium 8.4 - 10.5 mg/dL 9.6 9.6 9.6    BNP No results found for: BNP  ProBNP    Component Value Date/Time   PROBNP 64.5 11/01/2008 0010    PFT    Component Value Date/Time   FEV1PRE 0.93 05/21/2016 1052   FEV1POST 1.23 05/21/2016 1052   FVCPRE 2.22 05/21/2016 1052   FVCPOST 2.74 05/21/2016 1052   TLC 6.56 05/21/2016 1052   DLCOUNC 16.84 05/21/2016 1052   PREFEV1FVCRT 42 05/21/2016 1052   PSTFEV1FVCRT 45 05/21/2016 1052    DG Chest 2 View  Result Date: 09/01/2021 CLINICAL  DATA:  88 year old male with asthma EXAM: CHEST - 2 VIEW COMPARISON:  01/03/2021, 12/27/2020 FINDINGS: Cardiomediastinal silhouette unchanged in size and contour. No evidence of central vascular congestion. No interlobular septal thickening. Cardiac pacing device on the left chest wall unchanged. No significant difference in the reticular pattern opacities at the lung base, greater on the left than the right. No increasing pattern of reticular opacities, with no new confluent airspace disease. No pleural effusion or pneumothorax. Evidence of prior PTC I/coronary disease. No pneumothorax or pleural effusion. Coarsened interstitial markings, with no confluent airspace disease. No acute displaced  fracture. Degenerative changes of the spine. IMPRESSION: Changes of chronic fibrosis without evidence of superimposed acute cardiopulmonary disease Electronically Signed   By: Phillip Jordan D.O.   On: 09/01/2021 13:48     Past medical hx Past Medical History:  Diagnosis Date   Allergic rhinitis    Aortic dissection ([place])    f/u by cards - AAA with distal dissection.   CAD (coronary artery disease)    a. s/p MI 1992. b. Prior hx stenting to RCA/Cx. b. 05/2013: [place] s/p DES to distal RCA then staged DES to prox LAD.    COPD with asthma ([place])    Diabetes mellitus    Hyperlipidemia    Lichen planus    Right midline inferior chest, Dr. [name] Sensing   SCC (squamous cell carcinoma)    Shingles      Social History   Tobacco Use   Smoking status: Former    Packs/day: 1.50    Years: 39.00    Pack years: 58.50    Types: Cigarettes    Quit date: 08/24/1992    Years since quitting: 29.0   Smokeless tobacco: Never  Vaping Use   Vaping Use: Never used  Substance Use Topics   Alcohol use: No   Drug use: No    [name] reports that he quit smoking about 29 years ago. His smoking use included cigarettes. He has a 58.50 pack-year smoking history. He has never used smokeless tobacco. He reports that he does not drink alcohol and does not use drugs.  Tobacco Cessation: Former smoker quit 1994 with a 58.5 pack year smoking history   Past surgical hx, Family hx, Social hx all reviewed.  Current Outpatient Medications on File Prior to Visit  Medication Sig   albuterol (PROAIR HFA) 108 (90 Base) MCG/ACT inhaler 2 puffs up to every 4 hours as needed only  if your can't catch your breath   apixaban (ELIQUIS) 5 MG TABS tablet Take 1 tablet (5 mg total) by mouth 2 (two) times daily.   aspirin EC 81 MG tablet Take 81 mg by mouth daily. Swallow whole.   azelastine (ASTELIN) 0.1 % nasal spray Place 2 sprays into both nostrils at bedtime as needed for rhinitis. Use in each nostril as directed    dextromethorphan-guaiFENesin (MUCINEX DM) 30-600 MG 12hr tablet Take 1 tablet by mouth 2 (two) times daily as needed for cough.   fluticasone (FLONASE) 50 MCG/ACT nasal spray Place 2 sprays into both nostrils daily as needed for allergies or rhinitis.   glucose blood (FREESTYLE TEST STRIPS) test strip Check blood sugar no more than twice daily   isosorbide mononitrate (IMDUR) 30 MG 24 hr tablet Take 1 tablet by mouth twice daily   metFORMIN (GLUCOPHAGE) 500 MG tablet Take 1 tablet by mouth once daily with breakfast   metoprolol succinate (TOPROL-XL) 25 MG 24 hr tablet Take 0.5 tablets (12.5 mg total) by mouth  daily. (Replaces metoprolol TARTRATE)   Multiple Vitamin (MULTIVITAMIN WITH MINERALS) TABS tablet Take 1 tablet by mouth daily.   nitroGLYCERIN (NITROSTAT) 0.4 MG SL tablet Place 1 tablet (0.4 mg total) under the tongue every 5 (five) minutes as needed for chest pain (up to 3 doses only.).   pravastatin (PRAVACHOL) 40 MG tablet Take 1 tablet by mouth once daily   Tiotropium Bromide-Olodaterol (STIOLTO RESPIMAT) 2.5-2.5 MCG/ACT AERS Inhale 2 puffs into the lungs daily.   vitamin B-12 (CYANOCOBALAMIN) 100 MCG tablet Take 100 mcg by mouth daily.   No current facility-administered medications on file prior to visit.     No Known Allergies  Review Of Systems:  Constitutional:   No  weight loss, night sweats,  Fevers, chills, fatigue, or  lassitude.  HEENT:   No headaches,  Difficulty swallowing,  Tooth/dental problems, or  Sore throat,                No sneezing, itching, ear ache, nasal congestion, post nasal drip,   CV:  No chest pain,  Orthopnea, PND, swelling in lower extremities, anasarca, dizziness, palpitations, syncope.   GI  No heartburn, indigestion, abdominal pain, nausea, vomiting, diarrhea, change in bowel habits, loss of appetite, bloody stools.   Resp: + Baseline  shortness of breath with exertion less at rest.  No excess mucus, no productive cough,  No non-productive  cough,  No coughing up of blood.  No change in color of mucus.  + wheezing.  No chest wall deformity  Skin: no rash or lesions.  GU: no dysuria, change in color of urine, no urgency or frequency.  No flank pain, no hematuria   MS:  No joint pain or swelling.  No decreased range of motion.  No back pain.  Psych:  No change in mood or affect. No depression or anxiety.  No memory loss.   Vital Signs BP 140/60 (BP Location: Left Arm, Patient Position: Sitting, Cuff Size: Normal)    Pulse 69    Temp 97.7 F (36.5 C) (Oral)    Ht '5\' 9"'  (1.753 m)    Wt 170 lb 6.4 oz (77.3 kg)    SpO2 94%    BMI 25.16 kg/m    Physical Exam:  General- No distress,  A&Ox3, pleasant ENT: No sinus tenderness, TM clear, pale nasal mucosa, no oral exudate,no post nasal drip, no LAN, + HOH, wears hearing aids Cardiac: S1, S2, regular rate and rhythm, no murmur Chest: + wheeze/ rales/ dullness; no accessory muscle use, no nasal flaring, no sternal retractions, few crackles noted upon auscultation  Abd.: Soft Non-tender, ND, BS +, Body mass index is 25.16 kg/m. Ext: No clubbing cyanosis, edema Neuro:  normal strength, walks with a cane, MAE x 4, A&O x 3, HOH Skin: No rashes, warm and dry, No lesions Psych: normal mood and behavior   Assessment/Plan Slow to resolve COPD Flare Improved with Prednisone taper FENO remains > 50 ppb after pred taper Maintenance inhaler does not have ICS Plan You are still wheezing, and your FENO, which measures airway inflammation is still elevated.  We will try a Trelegy inhaler as a therapeutic trial. Take 1 puff once daily Rinse mouth after use If this does not improve your breathing let us know.  Stop Stialto while you are using Trelegy If you absolutely do not like the Trelegy, restart your Stialto 2 puffs once daily.  Use the rescue inhaler as needed for shortness of breath or wheezing. Follow up phone visit  in 2 weeks to see how you are doing on the Trelegy. I will  message Phillip Jordan and let him know you are trying the Trelegy.  We will get you reassigned to a MD who is [facility] based>> Dr. Silas Flood  Follow up 2 week tele visit  Please contact office for sooner follow up if symptoms do not improve or worsen or seek emergency care    I spent 40 minutes dedicated to the care of this patient on the date of this encounter to include pre-visit review of records, face-to-face time with the patient discussing conditions above, post visit ordering of testing, clinical documentation with the electronic health record, making appropriate referrals as documented, and communicating necessary information to the patient's healthcare team.   The only ICS we had samples of was Trelegy. If he does better with ICS we can evaluate other options if Trelegy is too expensive for the patient.   He has follow up with Dr. Silas Flood, as he needs to have a Primary Pulmonologist who is in the [employer] office   Magdalen Spatz, NP 09/17/2021  5:01 PM

## 2021-09-17 NOTE — Patient Instructions (Addendum)
It is good to see you today. You are still wheezing, and your FENO, which measures airway inflammation is still elevated.  We will try a Trelegy inhaler as a therapeutic trial. Take 1 puff once daily Rinse mouth after use If this does not improve your breathing let us know.  Stop Stialto while you are using Trelegy If you absolutely do not like the Trelegy, restart your Stialto 2 puffs once daily.  Use the rescue inhaler as needed for shortness of breath or wheezing. Follow up phone visit in 2 weeks to see how you are doing on the Trelegy. I will message Dr. [name] and let him know you are trying the Trelegy.  We will get you reassigned to a MD who is [facility] based>> Dr. Silas Flood  Follow up 2 week tele visit  Please contact office for sooner follow up if symptoms do not improve or worsen or seek emergency care

## 2021-09-18 NOTE — Progress Notes (Signed)
Results were shared with the patient 09/17/2021 at the [place].

## 2021-09-23 DIAGNOSIS — I2510 Atherosclerotic heart disease of native coronary artery without angina pectoris: Secondary | ICD-10-CM

## 2021-09-23 DIAGNOSIS — E785 Hyperlipidemia, unspecified: Secondary | ICD-10-CM | POA: Diagnosis not present

## 2021-09-23 DIAGNOSIS — E1142 Type 2 diabetes mellitus with diabetic polyneuropathy: Secondary | ICD-10-CM | POA: Diagnosis not present

## 2021-09-23 DIAGNOSIS — E11319 Type 2 diabetes mellitus with unspecified diabetic retinopathy without macular edema: Secondary | ICD-10-CM

## 2021-09-23 DIAGNOSIS — J449 Chronic obstructive pulmonary disease, unspecified: Secondary | ICD-10-CM | POA: Diagnosis not present

## 2021-09-25 ENCOUNTER — Encounter: Payer: Self-pay | Admitting: Internal Medicine

## 2021-09-25 ENCOUNTER — Ambulatory Visit (INDEPENDENT_AMBULATORY_CARE_PROVIDER_SITE_OTHER): Payer: HMO | Admitting: Internal Medicine

## 2021-09-25 VITALS — BP 152/80 | HR 67 | Temp 97.6°F | Resp 20 | Ht 69.0 in | Wt 171.0 lb

## 2021-09-25 DIAGNOSIS — R627 Adult failure to thrive: Secondary | ICD-10-CM | POA: Diagnosis not present

## 2021-09-25 DIAGNOSIS — R1032 Left lower quadrant pain: Secondary | ICD-10-CM

## 2021-09-25 DIAGNOSIS — J449 Chronic obstructive pulmonary disease, unspecified: Secondary | ICD-10-CM | POA: Diagnosis not present

## 2021-09-25 LAB — BASIC METABOLIC PANEL
BUN: 13 mg/dL (ref 6–23)
CO2: 31 mEq/L (ref 19–32)
Calcium: 9.5 mg/dL (ref 8.4–10.5)
Chloride: 94 mEq/L — ABNORMAL LOW (ref 96–112)
Creatinine, Ser: 0.92 mg/dL (ref 0.40–1.50)
GFR: 73.82 mL/min (ref >60.00)
Glucose, Bld: 137 mg/dL — ABNORMAL HIGH (ref 70–99)
Potassium: 4.5 mEq/L (ref 3.5–5.1)
Sodium: 133 mEq/L — ABNORMAL LOW (ref 135–145)

## 2021-09-25 LAB — URINALYSIS, ROUTINE W REFLEX MICROSCOPIC
Bilirubin Urine: NEGATIVE
Ketones, ur: NEGATIVE
Nitrite: POSITIVE — AB
Specific Gravity, Urine: 1.01 (ref 1.000–1.030)
Urine Glucose: NEGATIVE
Urobilinogen, UA: 0.2 (ref 0.0–1.0)
pH: 6 (ref 5.0–8.0)

## 2021-09-25 LAB — CBC WITH DIFFERENTIAL/PLATELET
Basophils Absolute: 0.1 10*3/uL (ref 0.0–0.1)
Basophils Relative: 0.5 % (ref 0.0–3.0)
Eosinophils Absolute: 0.1 10*3/uL (ref 0.0–0.7)
Eosinophils Relative: 1.1 % (ref 0.0–5.0)
HCT: 42 % (ref 39.0–52.0)
Hemoglobin: 13.9 g/dL (ref 13.0–17.0)
Lymphocytes Relative: 7.9 % — ABNORMAL LOW (ref 12.0–46.0)
Lymphs Abs: 1 10*3/uL (ref 0.7–4.0)
MCHC: 33.2 g/dL (ref 30.0–36.0)
MCV: 90.3 fl (ref 78.0–100.0)
Monocytes Absolute: 1.1 10*3/uL — ABNORMAL HIGH (ref 0.1–1.0)
Monocytes Relative: 9.1 % (ref 3.0–12.0)
Neutro Abs: 9.9 10*3/uL — ABNORMAL HIGH (ref 1.4–7.7)
Neutrophils Relative %: 81.4 % — ABNORMAL HIGH (ref 43.0–77.0)
Platelets: 275 10*3/uL (ref 150.0–400.0)
RBC: 4.66 Mil/uL (ref 4.22–5.81)
RDW: 13.1 % (ref 11.5–15.5)
WBC: 12.2 10*3/uL — ABNORMAL HIGH (ref 4.0–10.5)

## 2021-09-25 LAB — TSH: TSH: 2.85 u[IU]/mL (ref 0.35–5.50)

## 2021-09-25 NOTE — Progress Notes (Signed)
Subjective:    Patient ID: Phillip Jordan, male    DOB: 17-Jan-1930, 88 y.o.   MRN: [id number]  DOS:  09/25/2021 Type of visit - description: Acute  Started to feel unwell approximately 2 weeks ago. His main sxs is generalized weakness, moving and doing ADLs has become very difficult. He also have an increase in cough and phlegm production.  No hemoptysis.  Went to the pulmonary clinic, on 09/01/2020 with above symptoms, chest x-ray with no acute superimposed problem. Was Dx with a COPD/asthma exacerbation, Rx prednisone.   Had a follow-up on 09/17/2021, they feel he was a slowly improving with prednisone. Was recommended trial with Trelegy.  He is here w/ chief complaint of feeling extremely weak. Moving from one place to the other within his house has become very difficult. Mobility is not limited by shortness of breath or chest pain. Admits to some nausea but denies vomiting, no blood in the stools. 2 weeks ago did have some loose stools but that is largely resolved.  Currently appetite is okay and bowel movements are regular. On exam he had some tenderness at the left lower abdomen.  Denies gross hematuria. No history of recent fever.  Review of Systems See above   Past Medical History:  Diagnosis Date   Allergic rhinitis    Aortic dissection ([place])    f/u by cards - AAA with distal dissection.   CAD (coronary artery disease)    a. s/p MI 1992. b. Prior hx stenting to RCA/Cx. b. 05/2013: [place] s/p DES to distal RCA then staged DES to prox LAD.    COPD with asthma ([place])    Diabetes mellitus    Hyperlipidemia    Lichen planus    Right midline inferior chest, Dr. [name] Sensing   SCC (squamous cell carcinoma)    Shingles     Past Surgical History:  Procedure Laterality Date   BACK SURGERY  1993   CARDIAC CATHETERIZATION     left, w/ coronary angiography and left ventriculograpy   CARDIOVERSION N/A 09/05/2020   Procedure: CARDIOVERSION;  Surgeon: [name], MD;   Location[facility] ENDOSCOPY;  Service: Cardiovascular;  Laterality: N/A;   LEFT HEART CATHETERIZATION WITH CORONARY ANGIOGRAM N/A 06/09/2013   Procedure: LEFT HEART CATHETERIZATION WITH CORONARY ANGIOGRAM;  Surgeon: [name], MD;  Location: [facility] CATH LAB;  Service: Cardiovascular;  Laterality: N/A;   PACEMAKER IMPLANT N/A 11/06/2020   Procedure: PACEMAKER IMPLANT;  Surgeon: Evans Lance, MD;  Location: [place] CV LAB;  Service: Cardiovascular;  Laterality: N/A;   PERCUTANEOUS CORONARY STENT INTERVENTION (PCI-S) N/A 06/12/2013   Procedure: PERCUTANEOUS CORONARY STENT INTERVENTION (PCI-S);  Surgeon: [name], MD;  Location[facility] CATH LAB;  Service: Cardiovascular;  Laterality: N/A;  prox LAD   PTCA     w/ placement of drugeluting stent in the distal right coronary artery, and distal Cardiologist: [name]    Current Outpatient Medications  Medication Instructions   albuterol (PROAIR HFA) 108 (90 Base) MCG/ACT inhaler 2 puffs up to every 4 hours as needed only  if your can't catch your breath   apixaban (ELIQUIS) 5 mg, Oral, 2 times daily   aspirin EC 81 mg, Oral, Daily, Swallow whole.   azelastine (ASTELIN) 0.1 % nasal spray 2 sprays, Each Nare, At bedtime PRN, Use in each nostril as directed   dextromethorphan-guaiFENesin (MUCINEX DM) 30-600 MG 12hr tablet 1 tablet, Oral, 2 times daily PRN   fluticasone (FLONASE) 50 MCG/ACT nasal spray 2  sprays, Each Nare, Daily PRN   Fluticasone-Umeclidin-Vilant (TRELEGY ELLIPTA) 100-62.5-25 MCG/ACT AEPB 1 puff, Inhalation, Daily   glucose blood (FREESTYLE TEST STRIPS) test strip Check blood sugar no more than twice daily   isosorbide mononitrate (IMDUR) 30 MG 24 hr tablet Take 1 tablet by mouth twice daily   metFORMIN (GLUCOPHAGE) 500 MG tablet Take 1 tablet by mouth once daily with breakfast   metoprolol succinate (TOPROL-XL) 12.5 mg, Oral, Daily, (Replaces metoprolol TARTRATE)   Multiple Vitamin (MULTIVITAMIN WITH MINERALS) TABS  tablet 1 tablet, Daily   nitroGLYCERIN (NITROSTAT) 0.4 mg, Sublingual, Every 5 min PRN   pravastatin (PRAVACHOL) 40 MG tablet Take 1 tablet by mouth once daily   Tiotropium Bromide-Olodaterol (STIOLTO RESPIMAT) 2.5-2.5 MCG/ACT AERS 2 puffs, Inhalation, Daily   vitamin B-12 (CYANOCOBALAMIN) 100 mcg, Daily       Objective:   Physical Exam BP (!) 152/80 (BP Location: Left Arm, Patient Position: Sitting, Cuff Size: Small)    Pulse 67    Temp 97.6 F (36.4 C) (Oral)    Resp 20    Ht 5\' 9"  (1.753 m)    Wt 171 lb (77.6 kg)    SpO2 93%    BMI 25.25 kg/m  General:   Well developed, NAD, BMI noted.  HEENT:  Normocephalic . Face symmetric, atraumatic Lungs:  Decreased breath sounds Normal respiratory effort, no intercostal retractions, no accessory muscle use. Heart: RRR,  no murmur.  Abdomen:  Not distended, soft, slightly tender at the left lower quadrant without mass or rebound. GU: scrotal contents normal. Inguinal areas with no evidence of hernias. Skin: Not pale. Not jaundice Lower extremities: no pretibial edema bilaterally  Neurologic:  alert & oriented X3.  Speech normal, gait uses a cane, needs help transferring.   Psych--  Cognition and judgment appear intact.  Cooperative with normal attention span and concentration.  Behavior appropriate. No anxious or depressed appearing.     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 D/c norvasc, plavix, rx metoprolol eliquis   anticoagulated *Underwent DCCV 09/05/2020, metoprolol dose adjusted due to bradycardia. *pacemaker (11/06/2020) COPD and asthma (on GERD meds)--- Dr [name] Lichen planus Dr. Ronnald Ramp Chronic hoarseness, status post ENT eval ~ 2012 Normal DEXA 2012 HOH H/o  Shingles  PLAN: Failure to thrive: The patient become extremely weak 2 weeks ago, saw pulmonary, felt to have COPD exacerbation.  Received prednisone, did a trial with Trelegy to see if that could help his COPD better but actually did not thus pt back on Stiolto.. Denies DOE or chest pain. Overall FTT is probably multifactorial including possibly recent viral illness/COPD exacerbation. Plan:  To be sure we will check a BMP, CBC, TSH. Otherwise observation COPD: See above LLQ abdominal tenderness: mild, no evidence of obstruction or hernia, check UA urine culture RTC 4 weeks.    This visit occurred during the SARS-CoV-2 public health emergency.  Safety protocols were in place, including screening questions prior to the visit, additional usage of staff PPE, and extensive cleaning of exam room while observing appropriate contact time as indicated for disinfecting solutions.

## 2021-09-25 NOTE — Assessment & Plan Note (Signed)
Failure to thrive: The patient become extremely weak 2 weeks ago, saw pulmonary, felt to have COPD exacerbation.  Received prednisone, did a trial with Trelegy to see if that could help his COPD better but actually did not thus pt back on Stiolto.. Denies DOE or chest pain. Overall FTT is probably multifactorial including possibly recent viral illness/COPD exacerbation. Plan:  To be sure we will check a BMP, CBC, TSH. Otherwise observation COPD: See above LLQ abdominal tenderness: mild, no evidence of obstruction or hernia, check UA urine culture RTC 4 weeks.

## 2021-09-25 NOTE — Patient Instructions (Signed)
Continue the same medications including the Stiolto  Good hydration  If you are not gradually improving let me know  Go to the lab and get please go to the lab  Schedule a follow-up with me in 4 weeks

## 2021-09-26 MED ORDER — SULFAMETHOXAZOLE-TRIMETHOPRIM 800-160 MG PO TABS: 1.0000 | ORAL_TABLET | Freq: Two times a day (BID) | ORAL | 0 refills | Status: DC

## 2021-09-26 NOTE — Addendum Note (Signed)
Addended by: Damita Dunnings D on: 09/26/2021 10:10 AM   Modules accepted: Orders

## 2021-09-27 LAB — URINE CULTURE
MICRO NUMBER:: 12954898
SPECIMEN QUALITY:: ADEQUATE

## 2021-09-29 ENCOUNTER — Encounter: Payer: Self-pay | Admitting: Internal Medicine

## 2021-10-01 ENCOUNTER — Encounter: Payer: Self-pay | Admitting: Internal Medicine

## 2021-10-02 MED ORDER — ONDANSETRON HCL 4 MG PO TABS: 4.0000 mg | ORAL_TABLET | Freq: Two times a day (BID) | ORAL | 0 refills | Status: AC | PRN

## 2021-10-06 ENCOUNTER — Ambulatory Visit: Payer: HMO | Admitting: Pulmonary Disease

## 2021-10-06 ENCOUNTER — Other Ambulatory Visit: Payer: Self-pay

## 2021-10-06 ENCOUNTER — Encounter: Payer: Self-pay | Admitting: Pulmonary Disease

## 2021-10-06 VITALS — BP 124/66 | HR 67 | Temp 98.7°F | Ht 69.0 in | Wt 167.6 lb

## 2021-10-06 DIAGNOSIS — J309 Allergic rhinitis, unspecified: Secondary | ICD-10-CM

## 2021-10-06 DIAGNOSIS — J449 Chronic obstructive pulmonary disease, unspecified: Secondary | ICD-10-CM

## 2021-10-06 MED ORDER — FLUTICASONE PROPIONATE HFA 220 MCG/ACT IN AERO: 2.0000 | INHALATION_SPRAY | Freq: Two times a day (BID) | RESPIRATORY_TRACT | 0 refills | Status: AC

## 2021-10-06 NOTE — Patient Instructions (Addendum)
Nice to meet you  Continue Stiolto 2 puffs once a day  Take flovent 2 puffs twice a day - rinse out mouth out after using Flovent  Use flovent every day, twice a day for 1 month. If it is helpful, let me know and I will send a prescription to the [place] for long term use.  If flovent is not helpful, stop using after 1 month - just send me a message and let me know.

## 2021-10-13 DIAGNOSIS — C4442 Squamous cell carcinoma of skin of scalp and neck: Secondary | ICD-10-CM | POA: Diagnosis not present

## 2021-10-13 DIAGNOSIS — D692 Other nonthrombocytopenic purpura: Secondary | ICD-10-CM | POA: Diagnosis not present

## 2021-10-13 DIAGNOSIS — D485 Neoplasm of uncertain behavior of skin: Secondary | ICD-10-CM | POA: Diagnosis not present

## 2021-10-13 DIAGNOSIS — L821 Other seborrheic keratosis: Secondary | ICD-10-CM | POA: Diagnosis not present

## 2021-10-13 DIAGNOSIS — Z85828 Personal history of other malignant neoplasm of skin: Secondary | ICD-10-CM | POA: Diagnosis not present

## 2021-10-13 DIAGNOSIS — L570 Actinic keratosis: Secondary | ICD-10-CM | POA: Diagnosis not present

## 2021-10-13 DIAGNOSIS — D225 Melanocytic nevi of trunk: Secondary | ICD-10-CM | POA: Diagnosis not present

## 2021-10-15 ENCOUNTER — Other Ambulatory Visit: Payer: Self-pay

## 2021-10-15 ENCOUNTER — Ambulatory Visit: Payer: HMO | Admitting: Podiatry

## 2021-10-15 DIAGNOSIS — M79676 Pain in unspecified toe(s): Secondary | ICD-10-CM

## 2021-10-15 DIAGNOSIS — E0843 Diabetes mellitus due to underlying condition with diabetic autonomic (poly)neuropathy: Secondary | ICD-10-CM | POA: Diagnosis not present

## 2021-10-15 DIAGNOSIS — L97512 Non-pressure chronic ulcer of other part of right foot with fat layer exposed: Secondary | ICD-10-CM | POA: Diagnosis not present

## 2021-10-15 DIAGNOSIS — B351 Tinea unguium: Secondary | ICD-10-CM

## 2021-10-15 NOTE — Progress Notes (Signed)
° °  Subjective:  88 y.o. male  presenting today for follow-up evaluation of an ulcer to the plantar aspect of the right first MTPJ.  Patient has a very high arch foot that causes a lot of pressure to the area.  Patient is also requesting a nail trim today.  He was last seen in the office on 07/14/2021.  Presenting for further treatment and evaluation  Past Medical History:  Diagnosis Date   Allergic rhinitis    Aortic dissection ([place])    f/u by cards - AAA with distal dissection.   CAD (coronary artery disease)    a. s/p MI 1992. b. Prior hx stenting to RCA/Cx. b. 05/2013: [place] s/p DES to distal RCA then staged DES to prox LAD.    COPD with asthma ([place])    Diabetes mellitus    Hyperlipidemia    Lichen planus    Right midline inferior chest, Dr. [name] Sensing   SCC (squamous cell carcinoma)    Shingles       Objective/Physical Exam General: The patient is alert and oriented x3 in no acute distress.  Dermatology:  Wound #1 noted to the plantar aspect of the first MTP joint right foot measuring approximately 2.0 x 1.0 x 0.2 cm (LxWxD).  To the noted ulceration(s), there is no eschar. There is a minimal amount of slough, fibrin, and necrotic tissue noted. Granulation tissue and wound base is red. There is a minimal amount of serosanguineous drainage noted. There is no exposed bone muscle-tendon ligament or joint. There is no malodor. Periwound integrity is intact.  Vascular: Palpable pedal pulses bilaterally. No edema or erythema noted. Capillary refill within normal limits.  Neurological: Epicritic and protective threshold diminished bilaterally.   Musculoskeletal Exam: Range of motion within normal limits to all pedal and ankle joints bilateral. Muscle strength 5/5 in all groups bilateral. Plantarflexed first ray.   Assessment: 1.  Ulcer subfirst MTPJ right foot secondary to diabetes mellitus 2. Pain due to onychomycosis of toenails both  Plan of Care:  1. Patient was evaluated.    2. medically necessary excisional debridement including subcutaneous tissue was performed using a tissue nipper and a chisel blade. Excisional debridement of all the necrotic nonviable tissue down to healthy bleeding viable tissue was performed with post-debridement measurements same as pre-. 3. the wound was cleansed and dry sterile dressing applied. 4.  Continue gentamicin cream daily.  Patient states that he has sent a home 5.  Patient has not been using the offloading felt pads in his shoes.  Offloading felt pads were applied.  Insoles today to offload pressure from the great toe  6.  Mechanical debridement of nails 1-5 bilateral was performed using a nail nipper without incident or bleeding  7.  Patient is to return to clinic in 3 weeks   [name], DPM [facility]  Dr. [name], DPM    [address], [place] [address]                Office [phone]  Fax [phone]

## 2021-10-15 NOTE — Progress Notes (Signed)
@Patient  ID: Phillip Jordan, male    DOB: Jun 25, 1930, 88 y.o.   MRN: [id number]  Chief Complaint  Patient presents with   New Patient (Initial Visit)    New Pt Appt. Pt states that he did not feel any difference on the Trelegy so he went back to his Stiolto inhaler.     Referring provider: Colon Branch, MD  HPI:   88 y.o. here to establish care with new pulmonologist with history of COPD based on PFTs 2017.  Most recent pulm note reviewed.  Longstanding dyspnea on exertion.  Likely slightly worsening over time.  Recent change to Trelegy given description of this.  Was on Stiolto.  He perceived no benefit with the addition of Trelegy.  Back to using Stiolto.  Dyspnea worse on inclines or stairs.  Reviewed at length his prior PFTs which showed severe obstruction, likely etiology chronic dyspnea.  History significant bronchodilator response which is why addition of inhaled corticosteroids is reasonable to trial.  Discussed at length that PPI may not work for him and trying HFA may be more effective.  He expressed understanding and willing to try again.  Reviewed most recent chest x-ray 09/01/2021 that on my review interpretation shows evidence of hyperinflation.  Questionaires / Pulmonary Flowsheets:   ACT:  No flowsheet data found.  MMRC: No flowsheet data found.  Epworth:  No flowsheet data found.  Tests:   FENO:  No results found for: NITRICOXIDE  PFT: PFT Results Latest Ref Rng & Units 05/21/2016  FVC-Pre L 2.22  FVC-Predicted Pre % 62  FVC-Post L 2.74  FVC-Predicted Post % 77  Pre FEV1/FVC % % 42  Post FEV1/FCV % % 45  FEV1-Pre L 0.93  FEV1-Predicted Pre % 37  FEV1-Post L 1.23  DLCO uncorrected ml/min/mmHg 16.84  DLCO UNC% % 56  DLCO corrected ml/min/mmHg 17.14  DLCO COR %Predicted % 57  DLVA Predicted % 72  TLC L 6.56  TLC % Predicted % 98  RV % Predicted % 133  From 2017 personally reviewed and interpreted as severe fixed obstruction, significant improvement  after bronchodilators,   WALK:  SIX MIN WALK 03/19/2016 01/07/2016  2 Minute Oxygen Saturation % 90 93  2 Minute HR 100 90  4 Minute Oxygen Saturation % 88 89  4 Minute HR 108 100  6 Minute Oxygen Saturation % 87 87  6 Minute HR 112 109    Imaging: No results found.  Lab Results:  CBC    Component Value Date/Time   WBC 12.2 (H) 09/25/2021 1114   RBC 4.66 09/25/2021 1114   HGB 13.9 09/25/2021 1114   HGB 15.2 10/11/2020 0953   HCT 42.0 09/25/2021 1114   HCT 44.1 10/11/2020 0953   PLT 275.0 09/25/2021 1114   PLT 282 10/11/2020 0953   MCV 90.3 09/25/2021 1114   MCV 89 10/11/2020 0953   MCH 30.7 10/11/2020 0953   MCH 31.1 06/19/2020 1343   MCHC 33.2 09/25/2021 1114   RDW 13.1 09/25/2021 1114   RDW 11.7 10/11/2020 0953   LYMPHSABS 1.0 09/25/2021 1114   LYMPHSABS 1.6 10/11/2020 0953   MONOABS 1.1 (H) 09/25/2021 1114   EOSABS 0.1 09/25/2021 1114   EOSABS 0.3 10/11/2020 0953   BASOSABS 0.1 09/25/2021 1114   BASOSABS 0.1 10/11/2020 0953    BMET    Component Value Date/Time   NA 133 (L) 09/25/2021 1114   NA 134 10/11/2020 0953   K 4.5 09/25/2021 1114   CL 94 (L) 09/25/2021  1114   CO2 31 09/25/2021 1114   GLUCOSE 137 (H) 09/25/2021 1114   GLUCOSE 121 (H) 06/17/2006 1051   BUN 13 09/25/2021 1114   BUN 14 10/11/2020 0953   CREATININE 0.92 09/25/2021 1114   CREATININE 1.09 06/24/2020 1021   CALCIUM 9.5 09/25/2021 1114   GFRNONAA 74 10/11/2020 0953   GFRAA 86 10/11/2020 0953    BNP No results found for: BNP  ProBNP    Component Value Date/Time   PROBNP 64.5 11/01/2008 0010    Specialty Problems       Pulmonary Problems   COPD GOLD III    Followed in Pulmonary clinic/ [facility]/ [place]  But has also seen Ramaswamy and Clance Quit smoking in 1994   - PFT's 07/02/11  FEV1  1.41 (55%) ratio 66 and 12% better p B2 with DLCO 96% - PFTs  07/05/2013 FEV1  1.05 and 1.31 p B2 (24%) with DLCO 59 corrects to 84%  - Spiriva and other mdi result in hoarseness -  hfa 05/25/2013 75% p coaching>  90% 07/05/13  - added gerd rx 05/25/2013 >> improved 07/05/13  - rx dulera 200 2bid 07/05/13 > better 10/03/2013   - 10/08/2015 added GERD rx back due to pm strangling/ hoarseness  - 10/25/2015 added flutter - Rehab completed Aug 2017  - PFT's  05/21/2016  FEV1 1.23 (50 % ) ratio 45  p 32 % improvement from saba p dulera 200 prior to study with DLCO  56/57 % corrects to 72  % for alv volume    - added pred x 6 days as backup plan prn as of 03/12/2017   - 02/25/2021  After extensive coaching inhaler device,  effectiveness =    75% with smi > try stiolto 2 pffs each am x 4 week sample to see if hoareness better  > 04/11/2021 both hoarseness and breathing improved > f/u pulmonary prn and ent if hoarseness persists         Allergic rhinitis    Qualifier: Diagnosis of  By: Doy Mince LPN, Megan        Sinusitis, chronic    Referred to ent 11/08/2015 >  Seen 11/27/15 by Redmond Baseman, rec conservative f/u prn as symptoms resolved prior to ov  - try off singulair /06/14/2017       COPD exacerbation ([place])    Quit smoking 1990s - PFT's 07/02/11  FEV1  1.41 (55%) ratio 66 and 12% better p B2 with DLCO 96% - PFTs  07/05/2013 FEV1  1.05 and 1.31 p B2 (24%) with DLCO 59 corrects to 84%  - Spiriva and other mdi result in hoarseness - hfa 05/25/2013 75% p coaching>  90% 07/05/13  - added gerd rx 05/25/2013 >> improved 07/05/13  - rx dulera 200 2bid 07/05/13 > better 10/03/2013   - 10/08/2015 added GERD rx back due to pm strangling/ hoarseness  - 10/25/2015 added flutter - Rehab completed Aug 2017  - PFT's  05/21/2016  FEV1 1.23 (50 % ) ratio 45  p 32 % improvement from saba p dulera 200 prior to study with DLCO  56/57 % corrects to 72  % for alv volume   - 11/30/2016  After extensive coaching HFA effectiveness =    90%       Flare x 07/09/16 ? vrial uri > rx zpak/pred x6  - flare 01/15/2017  rx pred only  - flare 08/21/2019 rx prednisone x 6 days only  No Known  Allergies  Immunization History  Administered Date(s) Administered   Fluad Quad(high Dose 65+) 05/11/2019   Influenza Split 05/26/2011, 06/01/2012   Influenza Whole 06/30/2007, 06/18/2008, 06/14/2009, 04/24/2010   Influenza, High Dose Seasonal PF 06/19/2014, 05/02/2015, 06/08/2017, 05/16/2018   Influenza,inj,Quad PF,6+ Mos 06/10/2013   Influenza-Unspecified 05/21/2016, 05/16/2020, 05/02/2021   Moderna Covid-19 Vaccine Bivalent Booster 66yrs & up 07/28/2021   Moderna SARS-COV2 Booster Vaccination 06/24/2020, 01/22/2021   Moderna Sars-Covid-2 Vaccination 09/05/2019, 10/03/2019, 06/24/2020, 01/22/2021   Pneumococcal Conjugate-13 09/10/2015   Pneumococcal Polysaccharide-23 08/24/2001, 06/30/2007, 06/01/2012, 07/28/2021   Td 08/24/2005, 07/06/2016   Tdap 09/12/2005   Zoster Recombinat (Shingrix) 09/01/2018, 11/01/2018   Zoster, Live 10/13/2007    Past Medical History:  Diagnosis Date   Allergic rhinitis    Aortic dissection ([place])    f/u by cards - AAA with distal dissection.   CAD (coronary artery disease)    a. s/p MI 1992. b. Prior hx stenting to RCA/Cx. b. 05/2013: [place] s/p DES to distal RCA then staged DES to prox LAD.    COPD with asthma ([place])    Diabetes mellitus    Hyperlipidemia    Lichen planus    Right midline inferior chest, Dr. [name] Sensing   SCC (squamous cell carcinoma)    Shingles     Tobacco History: Social History   Tobacco Use  Smoking Status Former   Packs/day: 1.50   Years: 39.00   Pack years: 58.50   Types: Cigarettes   Quit date: 08/24/1992   Years since quitting: 29.1  Smokeless Tobacco Never   Counseling given: Not Answered   Continue to not smoke  Outpatient Encounter Medications as of 10/06/2021  Medication Sig   albuterol (PROAIR HFA) 108 (90 Base) MCG/ACT inhaler 2 puffs up to every 4 hours as needed only  if your can't catch your breath   apixaban (ELIQUIS) 5 MG TABS tablet Take 1 tablet (5 mg total) by mouth 2 (two) times daily.    aspirin EC 81 MG tablet Take 81 mg by mouth daily. Swallow whole.   azelastine (ASTELIN) 0.1 % nasal spray Place 2 sprays into both nostrils at bedtime as needed for rhinitis. Use in each nostril as directed   dextromethorphan-guaiFENesin (MUCINEX DM) 30-600 MG 12hr tablet Take 1 tablet by mouth 2 (two) times daily as needed for cough.   fluticasone (FLONASE) 50 MCG/ACT nasal spray Place 2 sprays into both nostrils daily as needed for allergies or rhinitis.   fluticasone (FLOVENT HFA) 220 MCG/ACT inhaler Inhale 2 puffs into the lungs 2 (two) times daily.   glucose blood (FREESTYLE TEST STRIPS) test strip Check blood sugar no more than twice daily   isosorbide mononitrate (IMDUR) 30 MG 24 hr tablet Take 1 tablet by mouth twice daily   metFORMIN (GLUCOPHAGE) 500 MG tablet Take 1 tablet by mouth once daily with breakfast   metoprolol succinate (TOPROL-XL) 25 MG 24 hr tablet Take 0.5 tablets (12.5 mg total) by mouth daily. (Replaces metoprolol TARTRATE)   Multiple Vitamin (MULTIVITAMIN WITH MINERALS) TABS tablet Take 1 tablet by mouth daily.   nitroGLYCERIN (NITROSTAT) 0.4 MG SL tablet Place 1 tablet (0.4 mg total) under the tongue every 5 (five) minutes as needed for chest pain (up to 3 doses only.).   ondansetron (ZOFRAN) 4 MG tablet Take 1 tablet (4 mg total) by mouth 2 (two) times daily as needed for nausea or vomiting.   pravastatin (PRAVACHOL) 40 MG tablet Take 1 tablet by mouth once daily  Tiotropium Bromide-Olodaterol (STIOLTO RESPIMAT) 2.5-2.5 MCG/ACT AERS Inhale 2 puffs into the lungs daily.   vitamin B-12 (CYANOCOBALAMIN) 100 MCG tablet Take 100 mcg by mouth daily.   [DISCONTINUED] sulfamethoxazole-trimethoprim (BACTRIM DS) 800-160 MG tablet Take 1 tablet by mouth 2 (two) times daily.   Fluticasone-Umeclidin-Vilant (TRELEGY ELLIPTA) 100-62.5-25 MCG/ACT AEPB Inhale 1 puff into the lungs daily at 6 (six) AM. (Patient not taking: Reported on 10/06/2021)   No facility-administered encounter  medications on file as of 10/06/2021.     Review of Systems  Review of Systems  No chest pain exertion.  No orthopnea or PND.  Comprehensive review of systems otherwise negative. Physical Exam  BP 124/66 (BP Location: Left Arm, Patient Position: Sitting, Cuff Size: Normal)    Pulse 67    Temp 98.7 F (37.1 C) (Oral)    Ht 5\' 9"  (1.753 m)    Wt 167 lb 9.6 oz (76 kg)    SpO2 95%    BMI 24.75 kg/m   Wt Readings from Last 5 Encounters:  10/06/21 167 lb 9.6 oz (76 kg)  09/25/21 171 lb (77.6 kg)  09/17/21 170 lb 6.4 oz (77.3 kg)  09/01/21 169 lb 3.2 oz (76.7 kg)  07/28/21 173 lb 6 oz (78.6 kg)    BMI Readings from Last 5 Encounters:  10/06/21 24.75 kg/m  09/25/21 25.25 kg/m  09/17/21 25.16 kg/m  09/01/21 24.99 kg/m  07/28/21 25.60 kg/m     Physical Exam General: Well-appearing, in no acute distress Eyes: EOMI, icterus Neck: Supple, no JVP Pulmonary: Clear, normal work of breathing Cardiovascular: Regular rhythm, no murmur Neuro: Normal gait, no weakness Psych: Normal mood, full affect   Assessment & Plan:   Severe COPD/asthma overlap: In 2017 based on PFTs.  Likely interval obstruction worsened over time with normal aging.  Trelegy not very helpful.  Continuing Stiolto.  Worry about DPI not being able to enter lungs effectively.  Continue Stiolto.  Add high-dose Flovent HFA 2 puff twice daily.  If not helpful within the next month, okay to stop.    DOE: Likely related to severe COPD.  Medication changes as above.   Return in about 3 months (around 01/03/2022).   Lanier Clam, MD 10/15/2021

## 2021-10-17 ENCOUNTER — Ambulatory Visit (INDEPENDENT_AMBULATORY_CARE_PROVIDER_SITE_OTHER): Payer: HMO

## 2021-10-17 VITALS — BP 122/65 | HR 74 | Ht 69.0 in | Wt 161.0 lb

## 2021-10-17 DIAGNOSIS — Z0000 Encounter for general adult medical examination without abnormal findings: Secondary | ICD-10-CM

## 2021-10-17 NOTE — Patient Instructions (Signed)
Phillip Jordan , Thank you for taking time to complete your Medicare Wellness Visit. I appreciate your ongoing commitment to your health goals. Please review the following plan we discussed and let me know if I can assist you in the future.   Screening recommendations/referrals: Colonoscopy: No longer required Recommended yearly ophthalmology/optometry visit for glaucoma screening and checkup Recommended yearly dental visit for hygiene and checkup  Vaccinations: Influenza vaccine: Up to date Pneumococcal vaccine: Up to date Tdap vaccine: Up to date  Shingles vaccine: Completed vaccines   Covid-19: Up to date  Advanced directives: Copy in chart  Conditions/risks identified: See problem list  Next appointment: Follow up in one year for your annual wellness visit.   Preventive Care 5 Years and Older, Male Preventive care refers to lifestyle choices and visits with your health care provider that can promote health and wellness. What does preventive care include? A yearly physical exam. This is also called an annual well check. Dental exams once or twice a year. Routine eye exams. Ask your health care provider how often you should have your eyes checked. Personal lifestyle choices, including: Daily care of your teeth and gums. Regular physical activity. Eating a healthy diet. Avoiding tobacco and drug use. Limiting alcohol use. Practicing safe sex. Taking low doses of aspirin every day. Taking vitamin and mineral supplements as recommended by your health care provider. What happens during an annual well check? The services and screenings done by your health care provider during your annual well check will depend on your age, overall health, lifestyle risk factors, and family history of disease. Counseling  Your health care provider may ask you questions about your: Alcohol use. Tobacco use. Drug use. Emotional well-being. Home and relationship well-being. Sexual  activity. Eating habits. History of falls. Memory and ability to understand (cognition). Work and work Statistician. Screening  You may have the following tests or measurements: Height, weight, and BMI. Blood pressure. Lipid and cholesterol levels. These may be checked every 5 years, or more frequently if you are over 87 years old. Skin check. Lung cancer screening. You may have this screening every year starting at age 83 if you have a 30-pack-year history of smoking and currently smoke or have quit within the past 15 years. Fecal occult blood test (FOBT) of the stool. You may have this test every year starting at age 79. Flexible sigmoidoscopy or colonoscopy. You may have a sigmoidoscopy every 5 years or a colonoscopy every 10 years starting at age 38. Prostate cancer screening. Recommendations will vary depending on your family history and other risks. Hepatitis C blood test. Hepatitis B blood test. Sexually transmitted disease (STD) testing. Diabetes screening. This is done by checking your blood sugar (glucose) after you have not eaten for a while (fasting). You may have this done every 1-3 years. Abdominal aortic aneurysm (AAA) screening. You may need this if you are a current or former smoker. Osteoporosis. You may be screened starting at age 7 if you are at high risk. Talk with your health care provider about your test results, treatment options, and if necessary, the need for more tests. Vaccines  Your health care provider may recommend certain vaccines, such as: Influenza vaccine. This is recommended every year. Tetanus, diphtheria, and acellular pertussis (Tdap, Td) vaccine. You may need a Td booster every 10 years. Zoster vaccine. You may need this after age 41. Pneumococcal 13-valent conjugate (PCV13) vaccine. One dose is recommended after age 73. Pneumococcal polysaccharide (PPSV23) vaccine. One dose is recommended  after age 61. Talk to your health care provider about which  screenings and vaccines you need and how often you need them. This information is not intended to replace advice given to you by your health care provider. Make sure you discuss any questions you have with your health care provider. Document Released: 09/06/2015 Document Revised: 04/29/2016 Document Reviewed: 06/11/2015 Elsevier Interactive Patient Education  2017 [employer] Prevention in the Home Falls can cause injuries. They can happen to people of all ages. There are many things you can do to make your home safe and to help prevent falls. What can I do on the outside of my home? Regularly fix the edges of walkways and driveways and fix any cracks. Remove anything that might make you trip as you walk through a door, such as a raised step or threshold. Trim any bushes or trees on the path to your home. Use bright outdoor lighting. Clear any walking paths of anything that might make someone trip, such as rocks or tools. Regularly check to see if handrails are loose or broken. Make sure that both sides of any steps have handrails. Any raised decks and porches should have guardrails on the edges. Have any leaves, snow, or ice cleared regularly. Use sand or salt on walking paths during winter. Clean up any spills in your garage right away. This includes oil or grease spills. What can I do in the bathroom? Use night lights. Install grab bars by the toilet and in the tub and shower. Do not use towel bars as grab bars. Use non-skid mats or decals in the tub or shower. If you need to sit down in the shower, use a plastic, non-slip stool. Keep the floor dry. Clean up any water that spills on the floor as soon as it happens. Remove soap buildup in the tub or shower regularly. Attach bath mats securely with double-sided non-slip rug tape. Do not have throw rugs and other things on the floor that can make you trip. What can I do in the bedroom? Use night lights. Make sure that you have a  light by your bed that is easy to reach. Do not use any sheets or blankets that are too big for your bed. They should not hang down onto the floor. Have a firm chair that has side arms. You can use this for support while you get dressed. Do not have throw rugs and other things on the floor that can make you trip. What can I do in the kitchen? Clean up any spills right away. Avoid walking on wet floors. Keep items that you use a lot in easy-to-reach places. If you need to reach something above you, use a strong step stool that has a grab bar. Keep electrical cords out of the way. Do not use floor polish or wax that makes floors slippery. If you must use wax, use non-skid floor wax. Do not have throw rugs and other things on the floor that can make you trip. What can I do with my stairs? Do not leave any items on the stairs. Make sure that there are handrails on both sides of the stairs and use them. Fix handrails that are broken or loose. Make sure that handrails are as long as the stairways. Check any carpeting to make sure that it is firmly attached to the stairs. Fix any carpet that is loose or worn. Avoid having throw rugs at the top or bottom of the stairs. If you  do have throw rugs, attach them to the floor with carpet tape. Make sure that you have a light switch at the top of the stairs and the bottom of the stairs. If you do not have them, ask someone to add them for you. What else can I do to help prevent falls? Wear shoes that: Do not have high heels. Have rubber bottoms. Are comfortable and fit you well. Are closed at the toe. Do not wear sandals. If you use a stepladder: Make sure that it is fully opened. Do not climb a closed stepladder. Make sure that both sides of the stepladder are locked into place. Ask someone to hold it for you, if possible. Clearly mark and make sure that you can see: Any grab bars or handrails. First and last steps. Where the edge of each step  is. Use tools that help you move around (mobility aids) if they are needed. These include: Canes. Walkers. Scooters. Crutches. Turn on the lights when you go into a dark area. Replace any light bulbs as soon as they burn out. Set up your furniture so you have a clear path. Avoid moving your furniture around. If any of your floors are uneven, fix them. If there are any pets around you, be aware of where they are. Review your medicines with your doctor. Some medicines can make you feel dizzy. This can increase your chance of falling. Ask your doctor what other things that you can do to help prevent falls. This information is not intended to replace advice given to you by your health care provider. Make sure you discuss any questions you have with your health care provider. Document Released: 06/06/2009 Document Revised: 01/16/2016 Document Reviewed: 09/14/2014 Elsevier Interactive Patient Education  2017 [employer].

## 2021-10-17 NOTE — Progress Notes (Addendum)
Subjective:   Phillip Jordan is a 88 y.o. male who presents for Medicare Annual/Subsequent preventive examination.  I connected with Phillip Jordan today by telephone and verified that I am speaking with the correct person using two identifiers. Location patient: home Location provider: work Persons participating in the virtual visit: patient, Marine scientist.    I discussed the limitations, risks, security and privacy concerns of performing an evaluation and management service by telephone and the availability of in person appointments. I also discussed with the patient that there may be a patient responsible charge related to this service. The patient expressed understanding and verbally consented to this telephonic visit.    Interactive audio and video telecommunications were attempted between this provider and patient, however failed, due to patient having technical difficulties OR patient did not have access to video capability.  We continued and completed visit with audio only.  Some vital signs may be absent or patient reported.   Time Spent with patient on telephone encounter: 25 minutes   Review of Systems     Cardiac Risk Factors include: advanced age (>63men, >81 women);hypertension;male gender;diabetes mellitus;dyslipidemia     Objective:    Today's Vitals   10/17/21 1340  BP: 122/65  Pulse: 74  Weight: 161 lb (73 kg)  Height: 5\' 9"  (1.753 m)   Body mass index is 23.78 kg/m.  Advanced Directives 10/17/2021 09/05/2020 03/03/2019 03/01/2018 03/08/2017 03/04/2016 01/03/2016  Does Patient Have a Medical Advance Directive? Yes Yes Yes Yes Yes Yes Yes  Type of Paramedic of [place];Living will Living will Living will Living will Living will Living will Living will;Healthcare Power of Attorney  Does patient want to make changes to medical advance directive? - - No - Patient declined - - - No - Patient declined  Copy of [place] in Chart? Yes -  validated most recent copy scanned in chart (See row information) - - - - Yes No - copy requested  Would patient like information on creating a medical advance directive? - - - - - - -  Pre-existing out of facility DNR order (yellow form or pink MOST form) - - - - - - -    Current Medications (verified) Outpatient Encounter Medications as of 10/17/2021  Medication Sig   albuterol (PROAIR HFA) 108 (90 Base) MCG/ACT inhaler 2 puffs up to every 4 hours as needed only  if your can't catch your breath   apixaban (ELIQUIS) 5 MG TABS tablet Take 1 tablet (5 mg total) by mouth 2 (two) times daily.   aspirin EC 81 MG tablet Take 81 mg by mouth daily. Swallow whole.   azelastine (ASTELIN) 0.1 % nasal spray Place 2 sprays into both nostrils at bedtime as needed for rhinitis. Use in each nostril as directed   dextromethorphan-guaiFENesin (MUCINEX DM) 30-600 MG 12hr tablet Take 1 tablet by mouth 2 (two) times daily as needed for cough.   fluticasone (FLONASE) 50 MCG/ACT nasal spray Place 2 sprays into both nostrils daily as needed for allergies or rhinitis.   fluticasone (FLOVENT HFA) 220 MCG/ACT inhaler Inhale 2 puffs into the lungs 2 (two) times daily.   glucose blood (FREESTYLE TEST STRIPS) test strip Check blood sugar no more than twice daily   isosorbide mononitrate (IMDUR) 30 MG 24 hr tablet Take 1 tablet by mouth twice daily   lidocaine (XYLOCAINE) 5 % ointment APPLY SMALL AMOUNT TO AFFECTED AREA TWICE A DAY AS NEEDED FOR PAIN IN HANDS AND FEET  metFORMIN (GLUCOPHAGE) 500 MG tablet Take 1 tablet by mouth once daily with breakfast   metoprolol succinate (TOPROL-XL) 25 MG 24 hr tablet Take 0.5 tablets (12.5 mg total) by mouth daily. (Replaces metoprolol TARTRATE)   Multiple Vitamin (MULTIVITAMIN WITH MINERALS) TABS tablet Take 1 tablet by mouth daily.   nitroGLYCERIN (NITROSTAT) 0.4 MG SL tablet Place 1 tablet (0.4 mg total) under the tongue every 5 (five) minutes as needed for chest pain (up to 3 doses  only.).   ondansetron (ZOFRAN) 4 MG tablet Take 1 tablet (4 mg total) by mouth 2 (two) times daily as needed for nausea or vomiting.   pravastatin (PRAVACHOL) 40 MG tablet Take 1 tablet by mouth once daily   Tiotropium Bromide-Olodaterol (STIOLTO RESPIMAT) 2.5-2.5 MCG/ACT AERS Inhale 2 puffs into the lungs daily.   vitamin B-12 (CYANOCOBALAMIN) 100 MCG tablet Take 100 mcg by mouth daily.   Fluticasone-Umeclidin-Vilant (TRELEGY ELLIPTA) 100-62.5-25 MCG/ACT AEPB Inhale 1 puff into the lungs daily at 6 (six) AM. (Patient not taking: Reported on 10/06/2021)   No facility-administered encounter medications on file as of 10/17/2021.    Allergies (verified) Patient has no known allergies.   History: Past Medical History:  Diagnosis Date   Allergic rhinitis    Aortic dissection ([place])    f/u by cards - AAA with distal dissection.   CAD (coronary artery disease)    a. s/p MI 1992. b. Prior hx stenting to RCA/Cx. b. 05/2013: [place] s/p DES to distal RCA then staged DES to prox LAD.    COPD with asthma ([place])    Diabetes mellitus    Hyperlipidemia    Lichen planus    Right midline inferior chest, Dr. [name] Sensing   SCC (squamous cell carcinoma)    Shingles    Past Surgical History:  Procedure Laterality Date   BACK SURGERY  1993   CARDIAC CATHETERIZATION     left, w/ coronary angiography and left ventriculograpy   CARDIOVERSION N/A 09/05/2020   Procedure: CARDIOVERSION;  Surgeon: [name], MD;  Location: [facility] ENDOSCOPY;  Service: Cardiovascular;  Laterality: N/A;   LEFT HEART CATHETERIZATION WITH CORONARY ANGIOGRAM N/A 06/09/2013   Procedure: LEFT HEART CATHETERIZATION WITH CORONARY ANGIOGRAM;  Surgeon: [name], MD;  Location: [facility] CATH LAB;  Service: Cardiovascular;  Laterality: N/A;   PACEMAKER IMPLANT N/A 11/06/2020   Procedure: PACEMAKER IMPLANT;  Surgeon: Evans Lance, MD;  Location: [facility] CV LAB;  Service: Cardiovascular;  Laterality: N/A;   PERCUTANEOUS CORONARY STENT  INTERVENTION (PCI-S) N/A 06/12/2013   Procedure: PERCUTANEOUS CORONARY STENT INTERVENTION (PCI-S);  Surgeon: [name], MD;  Location[facility] CATH LAB;  Service: Cardiovascular;  Laterality: N/A;  prox LAD   PTCA     w/ placement of drugeluting stent in the distal right coronary artery, and distal Cardiologist: [name]   Family History  Problem Relation Age of Onset   Asthma Mother    Heart disease Mother        CHF   Cancer Mother        melanoma   Heart disease Father        MI   Prostate cancer Neg Hx    Colon cancer Neg Hx    Social History   Socioeconomic History   Marital status: Married    Spouse name: Not on file   Number of children: 3   Years of education: Not on file   Highest education level: Not on file  Occupational History   Occupation:  retired  Tobacco Use   Smoking status: Former    Packs/day: 1.50    Years: 39.00    Pack years: 58.50    Types: Cigarettes    Quit date: 08/24/1992    Years since quitting: 29.1   Smokeless tobacco: Never  Vaping Use   Vaping Use: Never used  Substance and Sexual Activity   Alcohol use: No   Drug use: No   Sexual activity: Not on file  Other Topics Concern   Not on file  Social History Narrative   Lives w/ wife    3 kids, 15 Gk-GGK   Social Determinants of Health   Financial Resource Strain: Low Risk    Difficulty of Paying Living Expenses: Not very hard  Food Insecurity: No Food Insecurity   Worried About Charity fundraiser in the Last Year: Never true   Ran Out of Food in the Last Year: Never true  Transportation Needs: No Transportation Needs   Lack of Transportation (Medical): No   Lack of Transportation (Non-Medical): No  Physical Activity: Sufficiently Active   Days of Exercise per Week: 5 days   Minutes of Exercise per Session: 30 min  Stress: No Stress Concern Present   Feeling of Stress : Not at all  Social Connections: Moderately Integrated   Frequency of Communication with  Friends and Family: Twice a week   Frequency of Social Gatherings with Friends and Family: Twice a week   Attends Religious Services: More than 4 times per year   Active Member of [employer] or Organizations: No   Attends Music therapist: Never   Marital Status: Married    Tobacco Counseling Counseling given: Not Answered   Clinical Intake:  Pre-visit preparation completed: Yes  Pain : No/denies pain     BMI - recorded: 23.78 Nutritional Status: BMI of 19-24  Normal Nutritional Risks: Unintentional weight loss (6 pounds) Diabetes: Yes CBG done?: No Did pt. bring in CBG monitor from home?: No (phone visit)  How often do you need to have someone help you when you read instructions, pamphlets, or other written materials from your doctor or pharmacy?: 1 - Never  Diabetes:  Is the patient diabetic?  Yes  If diabetic, was a CBG obtained today?  No  Did the patient bring in their glucometer from home?  No phone visit How often do you monitor your CBG's? never.   Financial Strains and Diabetes Management:  Are you having any financial strains with the device, your supplies or your medication? No .  Does the patient want to be seen by Chronic Care Management for management of their diabetes?  No  Would the patient like to be referred to a Nutritionist or for Diabetic Management?  No   Diabetic Exams:  Diabetic Eye Exam: Completed 06/16/2021.   Diabetic Foot Exam: Pt has been advised about the importance in completing this exam. To be completed by PCP.   Interpreter Needed?: No  Information entered by :: Caroleen Hamman LPN   Activities of Daily Living In your present state of health, do you have any difficulty performing the following activities: 10/17/2021 10/22/2020  Hearing? Y Y  Comment hearing aids -  Vision? N N  Difficulty concentrating or making decisions? Y N  Comment occasionally -  Walking or climbing stairs? N N  Dressing or bathing? N N  Doing  errands, shopping? Y N  Comment pt does not drive anymore -  Preparing Food and eating ? N -  Using the Toilet? N -  In the past six months, have you accidently leaked urine? N -  Do you have problems with loss of bowel control? N -  Managing your Medications? N -  Managing your Finances? N -  Housekeeping or managing your Housekeeping? N -  Some recent data might be hidden    Patient Care Team: Colon Branch, MD as PCP - [name], MD as Consulting Physician (Dermatology) [name], MD as Consulting Physician (Ophthalmology) [name], MD as Consulting Physician (Pulmonary Disease) [name], MD as Consulting Physician (Cardiology) [name], DPM as Consulting Physician (Podiatry) [name], RPH-CPP (Pharmacist)  Indicate any recent Medical Services you may have received from other than [facility] providers in the past year (date may be approximate).     Assessment:   This is a routine wellness examination for Louie.  Hearing/Vision screen Hearing Screening - Comments:: Bilateral hearing aids Vision Screening - Comments:: Last eye exam-05/2021-Dr. Lyles  Dietary issues and exercise activities discussed: Current Exercise Habits: Home exercise routine, Type of exercise: stretching, Time (Minutes): 10, Frequency (Times/Week): 7, Weekly Exercise (Minutes/Week): 70, Intensity: Mild, Exercise limited by: None identified   Goals Addressed             This Visit's Progress    Maintain healthy active lifestyle   On track    COMPLETED: Patient Stated       No declines in health. Continue to serve God and country.       Depression Screen PHQ 2/9 Scores 10/17/2021 09/25/2021 02/21/2021 02/21/2021 10/22/2020 03/19/2020 03/03/2019  PHQ - 2 Score 0 0 0 0 0 0 0  Exception Documentation - - - - - - -    Fall Risk Fall Risk  10/17/2021 08/11/2021 02/21/2021 02/13/2021 10/22/2020  Falls in the past year? 0 0 1 0 0  Number falls in past yr: 0 0 0 0 0  Injury with  Fall? 0 0 0 0 0  Risk for fall due to : - Impaired mobility;Impaired balance/gait - - -  Follow up Falls prevention discussed Education provided;Falls evaluation completed - Falls evaluation completed -  Comment - continue to use cane for ambulation - - -    FALL RISK PREVENTION PERTAINING TO THE HOME:  Any stairs in or around the home? No  Home free of loose throw rugs in walkways, pet beds, electrical cords, etc? Yes  Adequate lighting in your home to reduce risk of falls? Yes   ASSISTIVE DEVICES UTILIZED TO PREVENT FALLS:  Life alert? No  Use of a cane, walker or w/c? Yes cane Grab bars in the bathroom? No  Shower chair or bench in shower? No  Elevated toilet seat or a handicapped toilet? No   TIMED UP AND GO:  Was the test performed? No . Phone visit   Cognitive Function:Normal cognitive status assessed by this Nurse Health Advisor. No abnormalities found.   MMSE - Mini Mental State Exam 03/01/2018 03/08/2017 03/04/2016  Not completed: Refused - -  Orientation to time - 5 5  Orientation to Place - 5 5  Registration - 3 3  Attention/ Calculation - 2 5  Recall - 2 3  Language- name 2 objects - 2 2  Language- repeat - 1 1  Language- follow 3 step command - 3 3  Language- read & follow direction - 1 1  Write a sentence - 1 1  Copy design - 1 1  Total score - 26 30  Immunizations Immunization History  Administered Date(s) Administered   Fluad Quad(high Dose 65+) 05/11/2019   Influenza Split 05/26/2011, 06/01/2012   Influenza Whole 06/30/2007, 06/18/2008, 06/14/2009, 04/24/2010   Influenza, High Dose Seasonal PF 06/19/2014, 05/02/2015, 06/08/2017, 05/16/2018   Influenza,inj,Quad PF,6+ Mos 06/10/2013   Influenza-Unspecified 05/21/2016, 05/16/2020, 05/02/2021   Moderna Covid-19 Vaccine Bivalent Booster 28yrs & up 07/28/2021   Moderna SARS-COV2 Booster Vaccination 06/24/2020, 01/22/2021   Moderna Sars-Covid-2 Vaccination 09/05/2019, 10/03/2019, 06/24/2020,  01/22/2021   Pneumococcal Conjugate-13 09/10/2015   Pneumococcal Polysaccharide-23 08/24/2001, 06/30/2007, 06/01/2012, 07/28/2021   Td 08/24/2005, 07/06/2016   Tdap 09/12/2005   Zoster Recombinat (Shingrix) 09/01/2018, 11/01/2018   Zoster, Live 10/13/2007    TDAP status: Up to date  Flu Vaccine status: Up to date  Pneumococcal vaccine status: Up to date  Covid-19 vaccine status: Completed vaccines  Qualifies for Shingles Vaccine? No   Zostavax completed Yes   Shingrix Completed?: Yes  Screening Tests Health Maintenance  Topic Date Due   FOOT EXAM  07/08/2021   COVID-19 Vaccine (3 - Moderna risk series) 07/28/2021   HEMOGLOBIN A1C  01/26/2022   OPHTHALMOLOGY EXAM  06/16/2022   TETANUS/TDAP  07/06/2026   Pneumonia Vaccine 83+ Years old  Completed   INFLUENZA VACCINE  Completed   Zoster Vaccines- Shingrix  Completed   HPV VACCINES  Aged Out    Health Maintenance  Health Maintenance Due  Topic Date Due   FOOT EXAM  07/08/2021   COVID-19 Vaccine (3 - Moderna risk series) 07/28/2021    Colorectal cancer screening: No longer required.   Lung Cancer Screening: (Low Dose CT Chest recommended if Age 62-80 years, 30 pack-year currently smoking OR have quit w/in 15years.) does not qualify.     Additional Screening:  Hepatitis C Screening: does not qualify  Vision Screening: Recommended annual ophthalmology exams for early detection of glaucoma and other disorders of the eye. Is the patient up to date with their annual eye exam?  Yes  Who is the provider or what is the name of the office in which the patient attends annual eye exams? Dr. Prudencio Burly   Dental Screening: Recommended annual dental exams for proper oral hygiene  Community Resource Referral / Chronic Care Management: CRR required this visit?  No   CCM required this visit?  No      Plan:     I have personally reviewed and noted the following in the patients chart:   Medical and social history Use of  alcohol, tobacco or illicit drugs  Current medications and supplements including opioid prescriptions. Patient is not currently taking opioid prescriptions. Functional ability and status Nutritional status Physical activity Advanced directives List of other physicians Hospitalizations, surgeries, and ER visits in previous 12 months Vitals Screenings to include cognitive, depression, and falls Referrals and appointments  In addition, I have reviewed and discussed with patient certain preventive protocols, quality metrics, and best practice recommendations. A written personalized care plan for preventive services as well as general preventive health recommendations were provided to patient.   Due to this being a telephonic visit, the after visit summary with patients personalized plan was offered to patient via mail or my-chart.  Patient would like to access on my-chart.   Marta Antu, LPN   [id number]  Nurse Health Advisor  Nurse Notes: None  I have reviewed and agree with Health Coaches documentation.  Kathlene November, MD

## 2021-10-21 DIAGNOSIS — H6123 Impacted cerumen, bilateral: Secondary | ICD-10-CM | POA: Diagnosis not present

## 2021-10-27 ENCOUNTER — Ambulatory Visit (INDEPENDENT_AMBULATORY_CARE_PROVIDER_SITE_OTHER): Payer: HMO | Admitting: Internal Medicine

## 2021-10-27 ENCOUNTER — Encounter: Payer: Self-pay | Admitting: Internal Medicine

## 2021-10-27 VITALS — BP 142/74 | HR 72 | Temp 97.5°F | Resp 18 | Ht 69.0 in | Wt 168.1 lb

## 2021-10-27 DIAGNOSIS — R627 Adult failure to thrive: Secondary | ICD-10-CM

## 2021-10-27 DIAGNOSIS — M79602 Pain in left arm: Secondary | ICD-10-CM

## 2021-10-27 NOTE — Patient Instructions (Signed)
See you in April ?

## 2021-10-27 NOTE — Progress Notes (Signed)
? ?Subjective:  ? ? Patient ID: Phillip Jordan, male    DOB: 01/14/1930, 88 y.o.   MRN: [id number] ? ?DOS:  10/27/2021 ?Type of visit - description: f/u ? ?Follow-up from previous visit. Dx was failure to thrive. ?Was found to have a UTI, treated. ?Feels better. ?Denies fever chills.  Good appetite. ?No diarrhea or blood in the stools.  No abdominal pain. ? ?Did report on and off pain at the left arm for months.  Sometimes it is the left shoulder, or left elbow, or left wrist. ?Symptoms are at rest, nonexertional, no associated difficulty breathing-nausea-diaphoresis ? ?Review of Systems ?See above  ? ?Past Medical History:  ?Diagnosis Date  ? Allergic rhinitis   ? Aortic dissection ([place])   ? f/u by cards - AAA with distal dissection.  ? CAD (coronary artery disease)   ? a. s/p MI 1992. b. Prior hx stenting to RCA/Cx. b. 05/2013: [place] s/p DES to distal RCA then staged DES to prox LAD.   ? COPD with asthma ([place])   ? Diabetes mellitus   ? Hyperlipidemia   ? Lichen planus   ? Right midline inferior chest, Dr. [name] Sensing  ? SCC (squamous cell carcinoma)   ? Shingles   ? ? ?Past Surgical History:  ?Procedure Laterality Date  ? [place]  ? CARDIAC CATHETERIZATION    ? left, w/ coronary angiography and left ventriculograpy  ? CARDIOVERSION N/A 09/05/2020  ? Procedure: CARDIOVERSION;  Surgeon: [name], MD;  Location: [facility] ENDOSCOPY;  Service: Cardiovascular;  Laterality: N/A;  ? LEFT HEART CATHETERIZATION WITH CORONARY ANGIOGRAM N/A 06/09/2013  ? Procedure: LEFT HEART CATHETERIZATION WITH CORONARY ANGIOGRAM;  Surgeon: [name], MD;  Location: [facility] CATH LAB;  Service: Cardiovascular;  Laterality: N/A;  ? PACEMAKER IMPLANT N/A 11/06/2020  ? Procedure: PACEMAKER IMPLANT;  Surgeon: Evans Lance, MD;  Location: [facility] CV LAB;  Service: Cardiovascular;  Laterality: N/A;  ? PERCUTANEOUS CORONARY STENT INTERVENTION (PCI-S) N/A 06/12/2013  ? Procedure: PERCUTANEOUS CORONARY STENT INTERVENTION (PCI-S);   Surgeon: [name], MD;  Location: [facility] CATH LAB;  Service: Cardiovascular;  Laterality: N/A;  prox LAD  ? PTCA    ? w/ placement of drugeluting stent in the distal right coronary artery, and distal Cardiologist: [name]  ? ? ?Current Outpatient Medications  ?Medication Instructions  ? albuterol (PROAIR HFA) 108 (90 Base) MCG/ACT inhaler 2 puffs up to every 4 hours as needed only  if your can't catch your breath  ? apixaban (ELIQUIS) 5 mg, Oral, 2 times daily  ? aspirin EC 81 mg, Oral, Daily, Swallow whole.  ? azelastine (ASTELIN) 0.1 % nasal spray 2 sprays, Each Nare, At bedtime PRN, Use in each nostril as directed  ? dextromethorphan-guaiFENesin (MUCINEX DM) 30-600 MG 12hr tablet 1 tablet, Oral, 2 times daily PRN  ? fluticasone (FLONASE) 50 MCG/ACT nasal spray 2 sprays, Each Nare, Daily PRN  ? fluticasone (FLOVENT HFA) 220 MCG/ACT inhaler 2 puffs, Inhalation, 2 times daily  ? Fluticasone-Umeclidin-Vilant (TRELEGY ELLIPTA) 100-62.5-25 MCG/ACT AEPB 1 puff, Inhalation, Daily  ? glucose blood (FREESTYLE TEST STRIPS) test strip Check blood sugar no more than twice daily  ? isosorbide mononitrate (IMDUR) 30 MG 24 hr tablet Take 1 tablet by mouth twice daily  ? lidocaine (XYLOCAINE) 5 % ointment APPLY SMALL AMOUNT TO AFFECTED AREA TWICE A DAY AS NEEDED FOR PAIN IN HANDS AND FEET  ? metFORMIN (GLUCOPHAGE) 500 MG tablet Take 1 tablet by mouth once daily  with breakfast  ? metoprolol succinate (TOPROL-XL) 12.5 mg, Oral, Daily, (Replaces metoprolol TARTRATE)  ? Multiple Vitamin (MULTIVITAMIN WITH MINERALS) TABS tablet 1 tablet, Daily  ? nitroGLYCERIN (NITROSTAT) 0.4 mg, Sublingual, Every 5 min PRN  ? ondansetron (ZOFRAN) 4 mg, Oral, 2 times daily PRN  ? pravastatin (PRAVACHOL) 40 MG tablet Take 1 tablet by mouth once daily  ? Tiotropium Bromide-Olodaterol (STIOLTO RESPIMAT) 2.5-2.5 MCG/ACT AERS 2 puffs, Inhalation, Daily  ? vitamin B-12 (CYANOCOBALAMIN) 100 mcg, Oral, Daily  ? ? ?   ?Objective:  ? Physical  Exam ?BP (!) 142/74 (BP Location: Left Arm, Patient Position: Sitting, Cuff Size: Small)   Pulse 72   Temp (!) 97.5 ?F (36.4 ?C) (Oral)   Resp 18   Ht '5\' 9"'$  (1.753 m)   Wt 168 lb 2 oz (76.3 kg)   SpO2 94%   BMI 24.83 kg/m?  ?General:   ?Well developed, NAD, BMI noted. ?HEENT:  ?Normocephalic . Face symmetric, atraumatic ?Lungs:  ?Decreased breath sounds ?Normal respiratory effort, no intercostal retractions, no accessory muscle use. ?Heart: RRR,  no murmur. ?Abdomen: Soft, nontender ?Lower extremities: no pretibial edema bilaterally  ?Skin: Not pale. Not jaundice ?Neurologic:  ?alert & oriented X3.  ?Speech normal, gait appropriate for age and unassisted ?Psych--  ?Cognition and judgment appear intact.  ?Cooperative with normal attention span and concentration.  ?Behavior appropriate. ?No anxious or depressed appearing.  ? ?   ?Assessment   ? ? 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 D/c norvasc, plavix, rx metoprolol eliquis   anticoagulated ?*Underwent DCCV 09/05/2020, metoprolol dose adjusted due to bradycardia. ?*pacemaker (11/06/2020) ?COPD and asthma (on GERD meds)--- Dr [name] ?Lichen planus Dr. Ronnald Ramp ?Chronic hoarseness, status post ENT eval ~ 2012 ?Normal DEXA 2012 ?HOH ?H/o Shingles ? ?PLAN: ?Failure to thrive: See last visit, he was not feeling well.  Work-up showed mild hyponatremia, elevated WBCs, E. coli urinary infection, he received Bactrim. ?At this point he is better and back to his baseline.  FTT was probably due to UTI.  No further eval at this point. ?Left arm pain: As described above, symptoms are not exertional and no associated with nausea or diaphoresis.  Patient wonders what he could take, okay Tylenol, call if symptoms are worse or exertional. ?RTC as scheduled  for April ?  ? ?This visit occurred during the SARS-CoV-2 public health emergency.  Safety protocols were in place, including screening questions prior to the visit, additional usage of staff PPE, and extensive cleaning of exam room while observing appropriate contact time as indicated for disinfecting solutions.  ? ?

## 2021-10-28 NOTE — Assessment & Plan Note (Signed)
Failure to thrive: See last visit, he was not feeling well.  Work-up showed mild hyponatremia, elevated WBCs, E. coli urinary infection, he received Bactrim. ?At this point he is better and back to his baseline.  FTT was probably due to UTI.  No further eval at this point. ?Left arm pain: As described above, symptoms are not exertional and no associated with nausea or diaphoresis.  Patient wonders what he could take, okay Tylenol, call if symptoms are worse or exertional. ?RTC as scheduled for April ?  ?

## 2021-10-31 ENCOUNTER — Other Ambulatory Visit: Payer: Self-pay | Admitting: Internal Medicine

## 2021-11-05 ENCOUNTER — Ambulatory Visit (INDEPENDENT_AMBULATORY_CARE_PROVIDER_SITE_OTHER): Payer: HMO

## 2021-11-05 ENCOUNTER — Ambulatory Visit: Payer: HMO | Admitting: Podiatry

## 2021-11-05 ENCOUNTER — Other Ambulatory Visit: Payer: Self-pay

## 2021-11-05 DIAGNOSIS — E0843 Diabetes mellitus due to underlying condition with diabetic autonomic (poly)neuropathy: Secondary | ICD-10-CM | POA: Diagnosis not present

## 2021-11-05 DIAGNOSIS — L97512 Non-pressure chronic ulcer of other part of right foot with fat layer exposed: Secondary | ICD-10-CM

## 2021-11-05 DIAGNOSIS — I495 Sick sinus syndrome: Secondary | ICD-10-CM

## 2021-11-05 LAB — CUP PACEART REMOTE DEVICE CHECK
Battery Remaining Longevity: 138 mo
Battery Voltage: 3.05 V
Brady Statistic AP VP Percent: 55.99 %
Brady Statistic AP VS Percent: 0.04 %
Brady Statistic AS VP Percent: 38.85 %
Brady Statistic AS VS Percent: 5.12 %
Brady Statistic RA Percent Paced: 57.66 %
Brady Statistic RV Percent Paced: 94.83 %
Date Time Interrogation Session: 20230313032502
Implantable Lead Implant Date: 20220316
Implantable Lead Implant Date: 20220316
Implantable Lead Location: 753859
Implantable Lead Location: 753860
Implantable Lead Model: 3830
Implantable Lead Model: 5076
Implantable Pulse Generator Implant Date: 20220316
Lead Channel Impedance Value: 380 Ohm
Lead Channel Impedance Value: 494 Ohm
Lead Channel Impedance Value: 513 Ohm
Lead Channel Impedance Value: 532 Ohm
Lead Channel Pacing Threshold Amplitude: 0.5 V
Lead Channel Pacing Threshold Amplitude: 0.875 V
Lead Channel Pacing Threshold Pulse Width: 0.4 ms
Lead Channel Pacing Threshold Pulse Width: 0.4 ms
Lead Channel Sensing Intrinsic Amplitude: 13.5 mV
Lead Channel Sensing Intrinsic Amplitude: 13.5 mV
Lead Channel Sensing Intrinsic Amplitude: 6.125 mV
Lead Channel Sensing Intrinsic Amplitude: 6.125 mV
Lead Channel Setting Pacing Amplitude: 1.5 V
Lead Channel Setting Pacing Amplitude: 2 V
Lead Channel Setting Pacing Pulse Width: 0.4 ms
Lead Channel Setting Sensing Sensitivity: 1.2 mV

## 2021-11-05 NOTE — Progress Notes (Signed)
? ?  Subjective:  ?88 y.o. male  presenting today for follow-up evaluation of an ulcer to the plantar aspect of the right first MTPJ.  Patient has a very high arch foot that causes a lot of pressure to the area.  Overall the patient is doing much better.  He says that he has been wearing the offloading felt pads to the insoles of his shoes.  No new complaints at this time ? ?Past Medical History:  ?Diagnosis Date  ? Allergic rhinitis   ? Aortic dissection ([place])   ? f/u by cards - AAA with distal dissection.  ? CAD (coronary artery disease)   ? a. s/p MI 1992. b. Prior hx stenting to RCA/Cx. b. 05/2013: [place] s/p DES to distal RCA then staged DES to prox LAD.   ? COPD with asthma ([place])   ? Diabetes mellitus   ? Hyperlipidemia   ? Lichen planus   ? Right midline inferior chest, Dr. [name] Sensing  ? SCC (squamous cell carcinoma)   ? Shingles   ? ? ?  ?Objective/Physical Exam ?General: The patient is alert and oriented x3 in no acute distress. ? ?Dermatology:  ?Significant improvement noted to wound #1 to the plantar aspect of the first MTP joint right foot measuring approximately 0.2 x 0.1 x 0.1 cm (LxWxD).  To the noted ulceration(s), there is no eschar. There is a minimal amount of slough, fibrin, and necrotic tissue noted. Granulation tissue and wound base is red. There is a minimal amount of serosanguineous drainage noted. There is no exposed bone muscle-tendon ligament or joint. There is no malodor. Periwound integrity is intact. ? ?Vascular: Palpable pedal pulses bilaterally. No edema or erythema noted. Capillary refill within normal limits. ? ?Neurological: Epicritic and protective threshold diminished bilaterally.  ? ?Musculoskeletal Exam: Range of motion within normal limits to all pedal and ankle joints bilateral. Muscle strength 5/5 in all groups bilateral. Plantarflexed first ray.  ? ?Assessment: ?1.  Ulcer subfirst MTPJ right foot secondary to diabetes mellitus ?2. Pain due to onychomycosis of toenails  both ? ?Plan of Care:  ?1. Patient was evaluated.   ?2. medically necessary excisional debridement including subcutaneous tissue was performed using a tissue nipper and a chisel blade. Excisional debridement of all the necrotic nonviable tissue down to healthy bleeding viable tissue was performed with post-debridement measurements same as pre-. ?3. the wound was cleansed and dry sterile dressing applied. ?4.  Continue gentamicin cream daily.  Patient states that he has sent a home ?5.  Patient states that he has been using the offloading felt pads.  Continue offloading felt pads.  Additional pads were dispensed today ?6.  Patient has tried diabetic shoes and custom molded diabetic insoles in the past, about 2 years ago, and he says that there was no improvement.  He is not interested in additional pairs of diabetic insoles ?7.  We will continue conservative treatment for now.  Return to clinic monthly ? ? ?[name], DPM ?[place] ? ?Dr. [name], DPM  ?  ?[address]                                     ?[place], [place] [address]                ?Office [phone]  ?Fax [phone] ? ? ? ? ?

## 2021-11-08 ENCOUNTER — Other Ambulatory Visit: Payer: Self-pay

## 2021-11-08 ENCOUNTER — Inpatient Hospital Stay (HOSPITAL_COMMUNITY)
Admission: EM | Admit: 2021-11-08 | Discharge: 2021-11-22 | DRG: 177 | Disposition: E | Payer: HMO | Attending: Internal Medicine | Admitting: Internal Medicine

## 2021-11-08 ENCOUNTER — Emergency Department (HOSPITAL_COMMUNITY): Payer: HMO

## 2021-11-08 DIAGNOSIS — I480 Paroxysmal atrial fibrillation: Secondary | ICD-10-CM | POA: Diagnosis not present

## 2021-11-08 DIAGNOSIS — I5043 Acute on chronic combined systolic (congestive) and diastolic (congestive) heart failure: Secondary | ICD-10-CM | POA: Diagnosis not present

## 2021-11-08 DIAGNOSIS — Z7982 Long term (current) use of aspirin: Secondary | ICD-10-CM

## 2021-11-08 DIAGNOSIS — I2510 Atherosclerotic heart disease of native coronary artery without angina pectoris: Secondary | ICD-10-CM | POA: Diagnosis present

## 2021-11-08 DIAGNOSIS — E785 Hyperlipidemia, unspecified: Secondary | ICD-10-CM | POA: Diagnosis present

## 2021-11-08 DIAGNOSIS — Z515 Encounter for palliative care: Secondary | ICD-10-CM | POA: Diagnosis not present

## 2021-11-08 DIAGNOSIS — Z7189 Other specified counseling: Secondary | ICD-10-CM | POA: Diagnosis not present

## 2021-11-08 DIAGNOSIS — I4892 Unspecified atrial flutter: Secondary | ICD-10-CM | POA: Diagnosis not present

## 2021-11-08 DIAGNOSIS — J309 Allergic rhinitis, unspecified: Secondary | ICD-10-CM | POA: Diagnosis present

## 2021-11-08 DIAGNOSIS — Z66 Do not resuscitate: Secondary | ICD-10-CM | POA: Diagnosis not present

## 2021-11-08 DIAGNOSIS — J449 Chronic obstructive pulmonary disease, unspecified: Secondary | ICD-10-CM | POA: Diagnosis not present

## 2021-11-08 DIAGNOSIS — J440 Chronic obstructive pulmonary disease with acute lower respiratory infection: Secondary | ICD-10-CM | POA: Diagnosis not present

## 2021-11-08 DIAGNOSIS — K922 Gastrointestinal hemorrhage, unspecified: Secondary | ICD-10-CM | POA: Diagnosis not present

## 2021-11-08 DIAGNOSIS — J1282 Pneumonia due to coronavirus disease 2019: Secondary | ICD-10-CM | POA: Diagnosis not present

## 2021-11-08 DIAGNOSIS — K5733 Diverticulitis of large intestine without perforation or abscess with bleeding: Secondary | ICD-10-CM | POA: Diagnosis not present

## 2021-11-08 DIAGNOSIS — Z955 Presence of coronary angioplasty implant and graft: Secondary | ICD-10-CM

## 2021-11-08 DIAGNOSIS — J9601 Acute respiratory failure with hypoxia: Secondary | ICD-10-CM | POA: Diagnosis not present

## 2021-11-08 DIAGNOSIS — H905 Unspecified sensorineural hearing loss: Secondary | ICD-10-CM | POA: Diagnosis not present

## 2021-11-08 DIAGNOSIS — I495 Sick sinus syndrome: Secondary | ICD-10-CM | POA: Diagnosis not present

## 2021-11-08 DIAGNOSIS — R0602 Shortness of breath: Secondary | ICD-10-CM | POA: Diagnosis not present

## 2021-11-08 DIAGNOSIS — J189 Pneumonia, unspecified organism: Secondary | ICD-10-CM | POA: Diagnosis present

## 2021-11-08 DIAGNOSIS — I25110 Atherosclerotic heart disease of native coronary artery with unstable angina pectoris: Secondary | ICD-10-CM | POA: Diagnosis not present

## 2021-11-08 DIAGNOSIS — I21A1 Myocardial infarction type 2: Secondary | ICD-10-CM | POA: Diagnosis not present

## 2021-11-08 DIAGNOSIS — R0902 Hypoxemia: Secondary | ICD-10-CM

## 2021-11-08 DIAGNOSIS — I110 Hypertensive heart disease with heart failure: Secondary | ICD-10-CM | POA: Diagnosis present

## 2021-11-08 DIAGNOSIS — Z7984 Long term (current) use of oral hypoglycemic drugs: Secondary | ICD-10-CM

## 2021-11-08 DIAGNOSIS — I252 Old myocardial infarction: Secondary | ICD-10-CM

## 2021-11-08 DIAGNOSIS — K449 Diaphragmatic hernia without obstruction or gangrene: Secondary | ICD-10-CM | POA: Diagnosis not present

## 2021-11-08 DIAGNOSIS — Z7901 Long term (current) use of anticoagulants: Secondary | ICD-10-CM | POA: Diagnosis not present

## 2021-11-08 DIAGNOSIS — D62 Acute posthemorrhagic anemia: Secondary | ICD-10-CM | POA: Diagnosis not present

## 2021-11-08 DIAGNOSIS — E876 Hypokalemia: Secondary | ICD-10-CM | POA: Diagnosis not present

## 2021-11-08 DIAGNOSIS — R54 Age-related physical debility: Secondary | ICD-10-CM | POA: Diagnosis not present

## 2021-11-08 DIAGNOSIS — R0600 Dyspnea, unspecified: Secondary | ICD-10-CM

## 2021-11-08 DIAGNOSIS — E871 Hypo-osmolality and hyponatremia: Secondary | ICD-10-CM | POA: Diagnosis not present

## 2021-11-08 DIAGNOSIS — K5730 Diverticulosis of large intestine without perforation or abscess without bleeding: Secondary | ICD-10-CM | POA: Diagnosis not present

## 2021-11-08 DIAGNOSIS — Z87891 Personal history of nicotine dependence: Secondary | ICD-10-CM

## 2021-11-08 DIAGNOSIS — K2980 Duodenitis without bleeding: Secondary | ICD-10-CM | POA: Diagnosis present

## 2021-11-08 DIAGNOSIS — J441 Chronic obstructive pulmonary disease with (acute) exacerbation: Secondary | ICD-10-CM | POA: Diagnosis not present

## 2021-11-08 DIAGNOSIS — Z8249 Family history of ischemic heart disease and other diseases of the circulatory system: Secondary | ICD-10-CM

## 2021-11-08 DIAGNOSIS — Z950 Presence of cardiac pacemaker: Secondary | ICD-10-CM

## 2021-11-08 DIAGNOSIS — E1165 Type 2 diabetes mellitus with hyperglycemia: Secondary | ICD-10-CM | POA: Diagnosis not present

## 2021-11-08 DIAGNOSIS — K5792 Diverticulitis of intestine, part unspecified, without perforation or abscess without bleeding: Secondary | ICD-10-CM | POA: Diagnosis not present

## 2021-11-08 DIAGNOSIS — E861 Hypovolemia: Secondary | ICD-10-CM | POA: Diagnosis not present

## 2021-11-08 DIAGNOSIS — U071 COVID-19: Secondary | ICD-10-CM | POA: Diagnosis not present

## 2021-11-08 DIAGNOSIS — J90 Pleural effusion, not elsewhere classified: Secondary | ICD-10-CM | POA: Diagnosis not present

## 2021-11-08 DIAGNOSIS — T380X5A Adverse effect of glucocorticoids and synthetic analogues, initial encounter: Secondary | ICD-10-CM | POA: Diagnosis not present

## 2021-11-08 DIAGNOSIS — R933 Abnormal findings on diagnostic imaging of other parts of digestive tract: Secondary | ICD-10-CM | POA: Diagnosis not present

## 2021-11-08 DIAGNOSIS — J159 Unspecified bacterial pneumonia: Secondary | ICD-10-CM | POA: Diagnosis not present

## 2021-11-08 DIAGNOSIS — I214 Non-ST elevation (NSTEMI) myocardial infarction: Secondary | ICD-10-CM

## 2021-11-08 DIAGNOSIS — E11319 Type 2 diabetes mellitus with unspecified diabetic retinopathy without macular edema: Secondary | ICD-10-CM | POA: Diagnosis not present

## 2021-11-08 DIAGNOSIS — Z79899 Other long term (current) drug therapy: Secondary | ICD-10-CM

## 2021-11-08 DIAGNOSIS — I10 Essential (primary) hypertension: Secondary | ICD-10-CM | POA: Diagnosis present

## 2021-11-08 DIAGNOSIS — K921 Melena: Secondary | ICD-10-CM | POA: Diagnosis not present

## 2021-11-08 DIAGNOSIS — N3289 Other specified disorders of bladder: Secondary | ICD-10-CM | POA: Diagnosis not present

## 2021-11-08 DIAGNOSIS — Z808 Family history of malignant neoplasm of other organs or systems: Secondary | ICD-10-CM

## 2021-11-08 DIAGNOSIS — Z7951 Long term (current) use of inhaled steroids: Secondary | ICD-10-CM

## 2021-11-08 DIAGNOSIS — R195 Other fecal abnormalities: Secondary | ICD-10-CM | POA: Diagnosis not present

## 2021-11-08 DIAGNOSIS — N281 Cyst of kidney, acquired: Secondary | ICD-10-CM | POA: Diagnosis not present

## 2021-11-08 DIAGNOSIS — I5189 Other ill-defined heart diseases: Secondary | ICD-10-CM | POA: Diagnosis not present

## 2021-11-08 DIAGNOSIS — I38 Endocarditis, valve unspecified: Secondary | ICD-10-CM | POA: Diagnosis not present

## 2021-11-08 LAB — TYPE AND SCREEN
ABO/RH(D): B POS
Antibody Screen: NEGATIVE

## 2021-11-08 LAB — COMPREHENSIVE METABOLIC PANEL
ALT: 17 U/L (ref 0–44)
AST: 29 U/L (ref 15–41)
Albumin: 3.9 g/dL (ref 3.5–5.0)
Alkaline Phosphatase: 78 U/L (ref 38–126)
Anion gap: 11 (ref 5–15)
BUN: 13 mg/dL (ref 8–23)
CO2: 24 mmol/L (ref 22–32)
Calcium: 8.2 mg/dL — ABNORMAL LOW (ref 8.9–10.3)
Chloride: 83 mmol/L — ABNORMAL LOW (ref 98–111)
Creatinine, Ser: 0.72 mg/dL (ref 0.61–1.24)
GFR, Estimated: >60 mL/min (ref >60)
Glucose, Bld: 235 mg/dL — ABNORMAL HIGH (ref 70–99)
Potassium: 3.8 mmol/L (ref 3.5–5.1)
Sodium: 118 mmol/L — CL (ref 135–145)
Total Bilirubin: 0.6 mg/dL (ref 0.3–1.2)
Total Protein: 7.2 g/dL (ref 6.5–8.1)

## 2021-11-08 LAB — BLOOD GAS, ARTERIAL
Acid-base deficit: 1.3 mmol/L (ref 0.0–2.0)
Bicarbonate: 24.3 mmol/L (ref 20.0–28.0)
Delivery systems: POSITIVE
Expiratory PAP: 8 cmH2O
FIO2: 60 %
Inspiratory PAP: 16 cmH2O
O2 Saturation: 99.6 %
Patient temperature: 37
pCO2 arterial: 43 mmHg (ref 32–48)
pH, Arterial: 7.36 (ref 7.35–7.45)
pO2, Arterial: 180 mmHg — ABNORMAL HIGH (ref 83–108)

## 2021-11-08 LAB — CBC WITH DIFFERENTIAL/PLATELET
Abs Immature Granulocytes: 0 10*3/uL (ref 0.00–0.07)
Band Neutrophils: 11 %
Basophils Absolute: 0 10*3/uL (ref 0.0–0.1)
Basophils Relative: 0 %
Eosinophils Absolute: 0 10*3/uL (ref 0.0–0.5)
Eosinophils Relative: 0 %
HCT: 41.5 % (ref 39.0–52.0)
Hemoglobin: 14.3 g/dL (ref 13.0–17.0)
Lymphocytes Relative: 2 %
Lymphs Abs: 0.5 10*3/uL — ABNORMAL LOW (ref 0.7–4.0)
MCH: 30.2 pg (ref 26.0–34.0)
MCHC: 34.5 g/dL (ref 30.0–36.0)
MCV: 87.7 fL (ref 80.0–100.0)
Monocytes Absolute: 2.3 10*3/uL — ABNORMAL HIGH (ref 0.1–1.0)
Monocytes Relative: 9 %
Neutro Abs: 22.5 10*3/uL — ABNORMAL HIGH (ref 1.7–7.7)
Neutrophils Relative %: 78 %
Platelets: 297 10*3/uL (ref 150–400)
RBC: 4.73 MIL/uL (ref 4.22–5.81)
RDW: 12.8 % (ref 11.5–15.5)
WBC: 25.3 10*3/uL — ABNORMAL HIGH (ref 4.0–10.5)
nRBC: 0 % (ref 0.0–0.2)

## 2021-11-08 LAB — PROTIME-INR
INR: 1.3 — ABNORMAL HIGH (ref 0.8–1.2)
Prothrombin Time: 16.1 seconds — ABNORMAL HIGH (ref 11.4–15.2)

## 2021-11-08 LAB — TROPONIN I (HIGH SENSITIVITY): Troponin I (High Sensitivity): 90 ng/L — ABNORMAL HIGH (ref <18)

## 2021-11-08 LAB — BRAIN NATRIURETIC PEPTIDE: B Natriuretic Peptide: 695.6 pg/mL — ABNORMAL HIGH (ref 0.0–100.0)

## 2021-11-08 LAB — LACTIC ACID, PLASMA: Lactic Acid, Venous: 2.6 mmol/L (ref 0.5–1.9)

## 2021-11-08 MED ORDER — CEFEPIME HCL 2 G IJ SOLR
2.0000 g | Freq: Once | INTRAMUSCULAR | Status: AC
  Administered 2021-11-08: 2 g via INTRAVENOUS
  Filled 2021-11-08: qty 2

## 2021-11-08 MED ORDER — VANCOMYCIN HCL IN DEXTROSE 1-5 GM/200ML-% IV SOLN
1000.0000 mg | Freq: Once | INTRAVENOUS | Status: AC
  Administered 2021-11-08: 1000 mg via INTRAVENOUS
  Filled 2021-11-08: qty 200

## 2021-11-08 MED ORDER — SODIUM CHLORIDE 0.9 % IV BOLUS
1000.0000 mL | Freq: Once | INTRAVENOUS | Status: AC
  Administered 2021-11-09: 1000 mL via INTRAVENOUS

## 2021-11-08 MED ORDER — SODIUM CHLORIDE 0.9 % IV BOLUS
500.0000 mL | Freq: Once | INTRAVENOUS | Status: AC
  Administered 2021-11-08: 500 mL via INTRAVENOUS

## 2021-11-08 NOTE — ED Provider Notes (Addendum)
?[facility] DEPT ?Provider Note ? ? ?CSN: [id number] ?Arrival date & time: 11/07/2021  2044 ? ?  ? ?History ? ?Chief Complaint  ?Patient presents with  ? Shortness of Breath  ? ? ?Phillip Jordan is a 88 y.o. male. ? ?88 year old male with prior medical history as detailed below presents for evaluation.  Patient arrives by EMS from home.  Patient reports gradual worsening of difficulty catching his breath over the last 48 hours.  Patient reports low-grade fevers at home.  EMS reports that the patient was satting 70% on room air on their initial evaluation. ? ?Patient has received 1 g of Tylenol, 5 mg albuterol, Atrovent, 2 g of magnesium, and 125 mg of Solu-Medrol. ? ?Patient reports some improvement in his breathing with treatment by EMS. ? ?He denies chest pain.  He denies other complaint.  He denies nausea. ? ?Additional history provided by the patient's wife upon her arrival.  Patient reportedly was diagnosed with COVID on Thursday of this past week. ? ?The history is provided by the patient and medical records.  ?Shortness of Breath ?Severity:  Moderate ?Onset quality:  Gradual ?Duration:  2 days ?Timing:  Constant ?Progression:  Worsening ?Chronicity:  New ?Relieved by:  Nothing ?Worsened by:  Nothing ? ?  ? ?Home Medications ?Prior to Admission medications   ?Medication Sig Start Date End Date Taking? Authorizing Provider  ?albuterol (PROAIR HFA) 108 (90 Base) MCG/ACT inhaler 2 puffs up to every 4 hours as needed only  if your can't catch your breath 08/12/21   [name], MD  ?apixaban (ELIQUIS) 5 MG TABS tablet Take 1 tablet (5 mg total) by mouth 2 (two) times daily. 07/29/20   [name], MD  ?aspirin EC 81 MG tablet Take 81 mg by mouth daily. Swallow whole.    [provider]  ?azelastine (ASTELIN) 0.1 % nasal spray Place 2 sprays into both nostrils at bedtime as needed for rhinitis. Use in each nostril as directed ?Patient not taking: Reported on 10/27/2021  03/30/19   Colon Branch, MD  ?dextromethorphan-guaiFENesin [facility] DM) 30-600 MG 12hr tablet Take 1 tablet by mouth 2 (two) times daily as needed for cough.    [provider]  ?fluticasone (FLONASE) 50 MCG/ACT nasal spray Place 2 sprays into both nostrils daily as needed for allergies or rhinitis.    [provider]  ?fluticasone (FLOVENT HFA) 220 MCG/ACT inhaler Inhale 2 puffs into the lungs 2 (two) times daily. 10/06/21   Hunsucker, Bonna Gains, MD  ?Fluticasone-Umeclidin-Vilant (TRELEGY ELLIPTA) 100-62.5-25 MCG/ACT AEPB Inhale 1 puff into the lungs daily at 6 (six) AM. ?Patient not taking: Reported on 10/06/2021 09/17/21   Magdalen Spatz, NP  ?glucose blood (FREESTYLE TEST STRIPS) test strip Check blood sugar no more than twice daily 06/19/20   Colon Branch, MD  ?isosorbide mononitrate (IMDUR) 30 MG 24 hr tablet Take 1 tablet by mouth twice daily 07/11/21   Colon Branch, MD  ?lidocaine (XYLOCAINE) 5 % ointment APPLY SMALL AMOUNT TO AFFECTED AREA TWICE A DAY AS NEEDED FOR PAIN IN HANDS AND FEET 10/10/21   [provider]  ?metFORMIN (GLUCOPHAGE) 500 MG tablet Take 1 tablet by mouth once daily with breakfast 10/31/21   Colon Branch, MD  ?metoprolol succinate (TOPROL-XL) 25 MG 24 hr tablet Take 0.5 tablets (12.5 mg total) by mouth daily. (Replaces metoprolol TARTRATE) 08/11/21   Colon Branch, MD  ?Multiple Vitamin (MULTIVITAMIN WITH MINERALS) TABS tablet Take 1  tablet by mouth daily.    [provider]  ?nitroGLYCERIN (NITROSTAT) 0.4 MG SL tablet Place 1 tablet (0.4 mg total) under the tongue every 5 (five) minutes as needed for chest pain (up to 3 doses only.). ?Patient not taking: Reported on 10/27/2021 12/30/18   [name], MD  ?ondansetron (ZOFRAN) 4 MG tablet Take 1 tablet (4 mg total) by mouth 2 (two) times daily as needed for nausea or vomiting. ?Patient not taking: Reported on 10/27/2021 10/02/21   Colon Branch, MD  ?pravastatin (PRAVACHOL) 40 MG tablet Take 1 tablet by mouth once daily  04/24/21   Colon Branch, MD  ?Tiotropium Bromide-Olodaterol (STIOLTO RESPIMAT) 2.5-2.5 MCG/ACT AERS Inhale 2 puffs into the lungs daily. 02/25/21   [name], MD  ?vitamin B-12 (CYANOCOBALAMIN) 100 MCG tablet Take 100 mcg by mouth daily.    [provider]  ?   ? ?Allergies    ?Patient has no known allergies.   ? ?Review of Systems   ?Review of Systems  ?Respiratory:  Positive for shortness of breath.   ?All other systems reviewed and are negative. ? ?Physical Exam ?Updated Vital Signs ?BP (!) 194/103   Pulse (!) 110   Temp 97.9 ?F (36.6 ?C) (Axillary)   Resp (!) 29   Ht '5\' 9"'$  (1.753 m)   Wt 76.2 kg   SpO2 98%   BMI 24.81 kg/m?  ?Physical Exam ?Vitals and nursing note reviewed.  ?Constitutional:   ?   General: He is not in acute distress. ?   Appearance: Normal appearance. He is well-developed. He is ill-appearing.  ?HENT:  ?   Head: Normocephalic and atraumatic.  ?Eyes:  ?   Conjunctiva/sclera: Conjunctivae normal.  ?   Pupils: Pupils are equal, round, and reactive to light.  ?Cardiovascular:  ?   Rate and Rhythm: Normal rate and regular rhythm.  ?   Heart sounds: Normal heart sounds.  ?Pulmonary:  ?   Effort: Tachypnea present. No respiratory distress.  ?   Breath sounds: Examination of the left-lower field reveals decreased breath sounds. Decreased breath sounds present.  ?   Comments: Increased work of breathing, decreased breath sounds at left base. ?Abdominal:  ?   General: There is no distension.  ?   Palpations: Abdomen is soft.  ?   Tenderness: There is no abdominal tenderness.  ?Musculoskeletal:     ?   General: No deformity. Normal range of motion.  ?   Cervical back: Normal range of motion and neck supple.  ?Skin: ?   General: Skin is warm and dry.  ?Neurological:  ?   General: No focal deficit present.  ?   Mental Status: He is alert and oriented to person, place, and time.  ? ? ?ED Results / Procedures / Treatments   ?Labs ?(all labs ordered are listed, but only abnormal results are  displayed) ?Labs Reviewed  ?CULTURE, BLOOD (ROUTINE X 2)  ?CULTURE, BLOOD (ROUTINE X 2)  ?RESP PANEL BY RT-PCR (FLU A&B, COVID) ARPGX2  ?BLOOD GAS, ARTERIAL  ?URINALYSIS, ROUTINE W REFLEX MICROSCOPIC  ?CBC WITH DIFFERENTIAL/PLATELET  ?COMPREHENSIVE METABOLIC PANEL  ?BRAIN NATRIURETIC PEPTIDE  ?LACTIC ACID, PLASMA  ?LACTIC ACID, PLASMA  ?PROTIME-INR  ?TYPE AND SCREEN  ?TROPONIN I (HIGH SENSITIVITY)  ? ? ?EKG ?None ? ?Radiology ?No results found. ? ?Procedures ?Procedures  ? ? ?Medications Ordered in ED ?Medications  ?vancomycin (VANCOCIN) IVPB 1000 mg/200 mL premix (has no administration in time range)  ?ceFEPIme (MAXIPIME) 2 g  in sodium chloride 0.9 % 100 mL IVPB (has no administration in time range)  ? ? ?ED Course/ Medical Decision Making/ A&P ?  ?                        ?Medical Decision Making ?Amount and/or Complexity of Data Reviewed ?Labs: ordered. ?Radiology: ordered. ? ?Risk ?Prescription drug management. ? ? ? ?Medical Screen Complete ? ?This patient presented to the ED with complaint of shortness of breath, cough. ? ?This complaint involves an extensive number of treatment options. The initial differential diagnosis includes, but is not limited to, pneumonia, congestive heart failure, COPD exacerbation, etc. ? ?This presentation is: Acute, Chronic, Self-Limited, Previously Undiagnosed, Uncertain Prognosis, Complicated, Systemic Symptoms, and Threat to Life/Bodily Function ? ?Patient is presenting with 2 days of worsening fever, cough, shortness of breath. ? ?EMS transported from home.  EMS reports initial room air sats were in the 70s. ? ?Patient with continued increased work of breathing upon arrival. ? ?BiPAP initiated in the ED. ? ?Patient is alert and oriented.  He reports that use of BiPAP is decreasing his symptoms.  He appears to be significantly more comfortable with BiPAP. ? ?Patient with questionable possible recent diagnosis of COVID.  Patient's initial blood pressure is elevated.  Will  initiate conservative volume IV fluid bolus out of concern for possible overload and/or acute COVID infection. ? ?Broad-spectrum antibiotics initiated in the ED for treatment of suspected pneumonia. ? ?Patient will requ

## 2021-11-08 NOTE — Progress Notes (Signed)
Pt being followed by [facility] for Sepsis protocol. 

## 2021-11-08 NOTE — Progress Notes (Signed)
A consult was received from an ED physician for vancomycin and cefepime per pharmacy dosing (for an indication other than meningitis). The patient's profile has been reviewed for ht/wt/allergies/indication/available labs. A one time order has been placed for the above antibiotics.  Further antibiotics/pharmacy consults should be ordered by admitting physician if indicated.       ?                ?Reuel Boom, PharmD, BCPS ?[phone] ?10/27/2021, 9:05 PM ? ?

## 2021-11-08 NOTE — ED Triage Notes (Signed)
Pt BIB EMS from home- SOB pt sats were in upper 70s on arrival. Low grade fever at home. DX with COVID. Non rebreather. Pt had rhonchi throughout and productive cough.  ? ?'1000mg'$  tylenol ?5 albuterol ?Atrovent ?'2mg'$  mag ?125 solumedrol ? ?IV - 20 LFA ? ? ?

## 2021-11-08 NOTE — Progress Notes (Signed)
Pt transported to ICU on BIPAP without complication.  

## 2021-11-08 NOTE — ED Notes (Signed)
Critical Lab: Na 118,  ?

## 2021-11-09 ENCOUNTER — Encounter (HOSPITAL_COMMUNITY): Payer: Self-pay | Admitting: Internal Medicine

## 2021-11-09 ENCOUNTER — Inpatient Hospital Stay (HOSPITAL_COMMUNITY): Payer: HMO

## 2021-11-09 DIAGNOSIS — I38 Endocarditis, valve unspecified: Secondary | ICD-10-CM | POA: Diagnosis not present

## 2021-11-09 DIAGNOSIS — J9601 Acute respiratory failure with hypoxia: Secondary | ICD-10-CM | POA: Insufficient documentation

## 2021-11-09 DIAGNOSIS — J189 Pneumonia, unspecified organism: Secondary | ICD-10-CM | POA: Diagnosis not present

## 2021-11-09 LAB — CBC WITH DIFFERENTIAL/PLATELET
Abs Immature Granulocytes: 0 10*3/uL (ref 0.00–0.07)
Band Neutrophils: 15 %
Basophils Absolute: 0 10*3/uL (ref 0.0–0.1)
Basophils Relative: 0 %
Eosinophils Absolute: 0 10*3/uL (ref 0.0–0.5)
Eosinophils Relative: 0 %
HCT: 35.2 % — ABNORMAL LOW (ref 39.0–52.0)
Hemoglobin: 12.2 g/dL — ABNORMAL LOW (ref 13.0–17.0)
Lymphocytes Relative: 0 %
Lymphs Abs: 0 10*3/uL — ABNORMAL LOW (ref 0.7–4.0)
MCH: 30.6 pg (ref 26.0–34.0)
MCHC: 34.7 g/dL (ref 30.0–36.0)
MCV: 88.2 fL (ref 80.0–100.0)
Monocytes Absolute: 0.8 10*3/uL (ref 0.1–1.0)
Monocytes Relative: 3 %
Neutro Abs: 24.4 10*3/uL — ABNORMAL HIGH (ref 1.7–7.7)
Neutrophils Relative %: 82 %
Platelets: 243 10*3/uL (ref 150–400)
RBC: 3.99 MIL/uL — ABNORMAL LOW (ref 4.22–5.81)
RDW: 12.8 % (ref 11.5–15.5)
WBC: 25.2 10*3/uL — ABNORMAL HIGH (ref 4.0–10.5)
nRBC: 0 % (ref 0.0–0.2)

## 2021-11-09 LAB — ECHOCARDIOGRAM COMPLETE
AR max vel: 2.25 cm2
AV Area VTI: 2.24 cm2
AV Area mean vel: 1.99 cm2
AV Mean grad: 1 mmHg
AV Peak grad: 2.9 mmHg
Ao pk vel: 0.85 m/s
Area-P 1/2: 5.27 cm2
Calc EF: 44.9 %
Height: 69 in
MV M vel: 5.41 m/s
MV Peak grad: 117.1 mmHg
S' Lateral: 3.4 cm
Single Plane A2C EF: 40.5 %
Single Plane A4C EF: 51.9 %
Weight: 2687.85 oz

## 2021-11-09 LAB — HIV ANTIBODY (ROUTINE TESTING W REFLEX): HIV Screen 4th Generation wRfx: NONREACTIVE

## 2021-11-09 LAB — BASIC METABOLIC PANEL
Anion gap: 12 (ref 5–15)
Anion gap: 9 (ref 5–15)
Anion gap: 13 (ref 5–15)
BUN: 14 mg/dL (ref 8–23)
BUN: 14 mg/dL (ref 8–23)
BUN: 13 mg/dL (ref 8–23)
CO2: 20 mmol/L — ABNORMAL LOW (ref 22–32)
CO2: 22 mmol/L (ref 22–32)
CO2: 22 mmol/L (ref 22–32)
Calcium: 7.8 mg/dL — ABNORMAL LOW (ref 8.9–10.3)
Calcium: 8.4 mg/dL — ABNORMAL LOW (ref 8.9–10.3)
Calcium: 8.6 mg/dL — ABNORMAL LOW (ref 8.9–10.3)
Chloride: 87 mmol/L — ABNORMAL LOW (ref 98–111)
Chloride: 91 mmol/L — ABNORMAL LOW (ref 98–111)
Chloride: 90 mmol/L — ABNORMAL LOW (ref 98–111)
Creatinine, Ser: 0.98 mg/dL (ref 0.61–1.24)
Creatinine, Ser: 0.83 mg/dL (ref 0.61–1.24)
Creatinine, Ser: 0.77 mg/dL (ref 0.61–1.24)
GFR, Estimated: >60 mL/min (ref >60)
GFR, Estimated: >60 mL/min (ref >60)
GFR, Estimated: >60 mL/min (ref >60)
Glucose, Bld: 285 mg/dL — ABNORMAL HIGH (ref 70–99)
Glucose, Bld: 255 mg/dL — ABNORMAL HIGH (ref 70–99)
Glucose, Bld: 239 mg/dL — ABNORMAL HIGH (ref 70–99)
Potassium: 3.7 mmol/L (ref 3.5–5.1)
Potassium: 4.1 mmol/L (ref 3.5–5.1)
Potassium: 4 mmol/L (ref 3.5–5.1)
Sodium: 119 mmol/L — CL (ref 135–145)
Sodium: 122 mmol/L — ABNORMAL LOW (ref 135–145)
Sodium: 125 mmol/L — ABNORMAL LOW (ref 135–145)

## 2021-11-09 LAB — BLOOD GAS, ARTERIAL
Acid-Base Excess: 1 mmol/L (ref 0.0–2.0)
Allens test (pass/fail): POSITIVE — AB
Bicarbonate: 26.6 mmol/L (ref 20.0–28.0)
O2 Saturation: 100 %
Patient temperature: 36.4
pCO2 arterial: 44 mmHg (ref 32–48)
pH, Arterial: 7.39 (ref 7.35–7.45)
pO2, Arterial: 145 mmHg — ABNORMAL HIGH (ref 83–108)

## 2021-11-09 LAB — C-REACTIVE PROTEIN: CRP: 20.9 mg/dL — ABNORMAL HIGH (ref <1.0)

## 2021-11-09 LAB — RESP PANEL BY RT-PCR (FLU A&B, COVID) ARPGX2
Influenza A by PCR: NEGATIVE
Influenza B by PCR: NEGATIVE
SARS Coronavirus 2 by RT PCR: POSITIVE — AB

## 2021-11-09 LAB — GLUCOSE, CAPILLARY
Glucose-Capillary: 231 mg/dL — ABNORMAL HIGH (ref 70–99)
Glucose-Capillary: 201 mg/dL — ABNORMAL HIGH (ref 70–99)
Glucose-Capillary: 134 mg/dL — ABNORMAL HIGH (ref 70–99)

## 2021-11-09 LAB — MRSA NEXT GEN BY PCR, NASAL: MRSA by PCR Next Gen: NOT DETECTED

## 2021-11-09 LAB — HEMOGLOBIN A1C
Hgb A1c MFr Bld: 6.5 % — ABNORMAL HIGH (ref 4.8–5.6)
Mean Plasma Glucose: 139.85 mg/dL

## 2021-11-09 LAB — TROPONIN I (HIGH SENSITIVITY)
Troponin I (High Sensitivity): 1601 ng/L (ref <18)
Troponin I (High Sensitivity): 205 ng/L (ref <18)
Troponin I (High Sensitivity): 2823 ng/L (ref <18)
Troponin I (High Sensitivity): 290 ng/L (ref <18)
Troponin I (High Sensitivity): 294 ng/L (ref <18)

## 2021-11-09 LAB — SODIUM, URINE, RANDOM: Sodium, Ur: 18 mmol/L

## 2021-11-09 LAB — D-DIMER, QUANTITATIVE: D-Dimer, Quant: 0.84 ug/mL-FEU — ABNORMAL HIGH (ref 0.00–0.50)

## 2021-11-09 LAB — ABO/RH: ABO/RH(D): B POS

## 2021-11-09 LAB — PROCALCITONIN: Procalcitonin: 6.73 ng/mL

## 2021-11-09 LAB — OSMOLALITY, URINE: Osmolality, Ur: 247 mOsm/kg — ABNORMAL LOW (ref 300–900)

## 2021-11-09 LAB — LACTIC ACID, PLASMA: Lactic Acid, Venous: 3.3 mmol/L (ref 0.5–1.9)

## 2021-11-09 LAB — FERRITIN: Ferritin: 246 ng/mL (ref 24–336)

## 2021-11-09 LAB — CORTISOL: Cortisol, Plasma: 11.9 ug/dL

## 2021-11-09 MED ORDER — ACETAMINOPHEN 325 MG PO TABS
650.0000 mg | ORAL_TABLET | Freq: Four times a day (QID) | ORAL | Status: DC | PRN
  Administered 2021-11-10 – 2021-11-18 (×7): 650 mg via ORAL
  Filled 2021-11-09 (×9): qty 2

## 2021-11-09 MED ORDER — CHLORHEXIDINE GLUCONATE CLOTH 2 % EX PADS
6.0000 | MEDICATED_PAD | Freq: Every day | CUTANEOUS | Status: DC
  Administered 2021-11-10 – 2021-11-20 (×11): 6 via TOPICAL

## 2021-11-09 MED ORDER — HYDROCOD POLI-CHLORPHE POLI ER 10-8 MG/5ML PO SUER
5.0000 mL | Freq: Two times a day (BID) | ORAL | Status: DC | PRN
  Administered 2021-11-11 – 2021-11-20 (×5): 5 mL via ORAL
  Filled 2021-11-09 (×5): qty 5

## 2021-11-09 MED ORDER — METHYLPREDNISOLONE SODIUM SUCC 125 MG IJ SOLR
80.0000 mg | Freq: Two times a day (BID) | INTRAMUSCULAR | Status: AC
  Administered 2021-11-09 – 2021-11-11 (×6): 80 mg via INTRAVENOUS
  Filled 2021-11-09 (×6): qty 2

## 2021-11-09 MED ORDER — METOPROLOL SUCCINATE ER 25 MG PO TB24: 12.5000 mg | ORAL_TABLET | Freq: Every day | ORAL | Status: DC

## 2021-11-09 MED ORDER — SODIUM CHLORIDE 0.9 % IV SOLN
200.0000 mg | Freq: Once | INTRAVENOUS | Status: AC
  Administered 2021-11-09: 200 mg via INTRAVENOUS
  Filled 2021-11-09 (×2): qty 40

## 2021-11-09 MED ORDER — ASPIRIN EC 81 MG PO TBEC: 81.0000 mg | DELAYED_RELEASE_TABLET | Freq: Every evening | ORAL | Status: DC

## 2021-11-09 MED ORDER — VITAMIN B-12 100 MCG PO TABS
100.0000 ug | ORAL_TABLET | Freq: Every day | ORAL | Status: DC
  Filled 2021-11-09: qty 1

## 2021-11-09 MED ORDER — PROCHLORPERAZINE EDISYLATE 10 MG/2ML IJ SOLN
5.0000 mg | Freq: Four times a day (QID) | INTRAMUSCULAR | Status: DC | PRN
  Administered 2021-11-09 – 2021-11-15 (×2): 5 mg via INTRAVENOUS
  Filled 2021-11-09 (×2): qty 2

## 2021-11-09 MED ORDER — MORPHINE SULFATE (PF) 2 MG/ML IV SOLN
2.0000 mg | Freq: Once | INTRAVENOUS | Status: AC
  Administered 2021-11-10: 2 mg via INTRAVENOUS
  Filled 2021-11-09: qty 1

## 2021-11-09 MED ORDER — SODIUM CHLORIDE 0.9 % IV SOLN
100.0000 mg | Freq: Every day | INTRAVENOUS | Status: AC
  Administered 2021-11-10 – 2021-11-11 (×2): 100 mg via INTRAVENOUS
  Filled 2021-11-09 (×2): qty 20

## 2021-11-09 MED ORDER — ASPIRIN 325 MG PO TABS
325.0000 mg | ORAL_TABLET | Freq: Once | ORAL | Status: DC
  Filled 2021-11-09: qty 1

## 2021-11-09 MED ORDER — ACETAMINOPHEN 650 MG RE SUPP
650.0000 mg | Freq: Four times a day (QID) | RECTAL | Status: DC | PRN
Start: 2021-11-09 — End: 2021-11-21

## 2021-11-09 MED ORDER — ORAL CARE MOUTH RINSE
15.0000 mL | Freq: Two times a day (BID) | OROMUCOSAL | Status: DC
  Administered 2021-11-09 – 2021-11-20 (×20): 15 mL via OROMUCOSAL

## 2021-11-09 MED ORDER — INSULIN ASPART 100 UNIT/ML IJ SOLN
0.0000 [IU] | Freq: Three times a day (TID) | INTRAMUSCULAR | Status: DC
  Administered 2021-11-09 (×3): 3 [IU] via SUBCUTANEOUS
  Administered 2021-11-10: 2 [IU] via SUBCUTANEOUS
  Administered 2021-11-10 (×2): 3 [IU] via SUBCUTANEOUS
  Administered 2021-11-11: 7 [IU] via SUBCUTANEOUS
  Administered 2021-11-11: 3 [IU] via SUBCUTANEOUS
  Administered 2021-11-11: 5 [IU] via SUBCUTANEOUS
  Administered 2021-11-12: 3 [IU] via SUBCUTANEOUS
  Administered 2021-11-12: 5 [IU] via SUBCUTANEOUS
  Administered 2021-11-12: 7 [IU] via SUBCUTANEOUS
  Administered 2021-11-13: 1 [IU] via SUBCUTANEOUS
  Administered 2021-11-13 (×2): 3 [IU] via SUBCUTANEOUS
  Administered 2021-11-14: 5 [IU] via SUBCUTANEOUS
  Administered 2021-11-14 – 2021-11-15 (×3): 2 [IU] via SUBCUTANEOUS
  Administered 2021-11-15 – 2021-11-16 (×3): 3 [IU] via SUBCUTANEOUS
  Administered 2021-11-16: 7 [IU] via SUBCUTANEOUS
  Administered 2021-11-17: 5 [IU] via SUBCUTANEOUS
  Administered 2021-11-17: 2 [IU] via SUBCUTANEOUS
  Administered 2021-11-17: 5 [IU] via SUBCUTANEOUS
  Administered 2021-11-18: 2 [IU] via SUBCUTANEOUS

## 2021-11-09 MED ORDER — CHLORHEXIDINE GLUCONATE 0.12 % MT SOLN
OROMUCOSAL | Status: AC
  Administered 2021-11-09: 15 mL via OROMUCOSAL
  Filled 2021-11-09: qty 15

## 2021-11-09 MED ORDER — SODIUM CHLORIDE 0.9 % IV SOLN
2.0000 g | INTRAVENOUS | Status: DC
  Administered 2021-11-09 – 2021-11-12 (×4): 2 g via INTRAVENOUS
  Filled 2021-11-09 (×4): qty 20

## 2021-11-09 MED ORDER — CHLORHEXIDINE GLUCONATE 0.12 % MT SOLN
15.0000 mL | Freq: Two times a day (BID) | OROMUCOSAL | Status: DC
  Administered 2021-11-09 – 2021-11-17 (×15): 15 mL via OROMUCOSAL
  Filled 2021-11-09 (×16): qty 15

## 2021-11-09 MED ORDER — ISOSORBIDE MONONITRATE ER 30 MG PO TB24
30.0000 mg | ORAL_TABLET | Freq: Every day | ORAL | Status: DC
  Administered 2021-11-09 – 2021-11-20 (×11): 30 mg via ORAL
  Filled 2021-11-09 (×12): qty 1

## 2021-11-09 MED ORDER — PRAVASTATIN SODIUM 20 MG PO TABS: 40.0000 mg | ORAL_TABLET | Freq: Every day | ORAL | Status: DC

## 2021-11-09 MED ORDER — NITROGLYCERIN 0.4 MG SL SUBL
0.4000 mg | SUBLINGUAL_TABLET | SUBLINGUAL | Status: DC | PRN
  Administered 2021-11-11 (×2): 0.4 mg via SUBLINGUAL
  Filled 2021-11-09 (×3): qty 1

## 2021-11-09 MED ORDER — NITROGLYCERIN 2 % TD OINT
0.5000 [in_us] | TOPICAL_OINTMENT | Freq: Three times a day (TID) | TRANSDERMAL | Status: DC
  Administered 2021-11-09 – 2021-11-17 (×24): 0.5 [in_us] via TOPICAL
  Filled 2021-11-09 (×2): qty 30

## 2021-11-09 MED ORDER — ASPIRIN 300 MG RE SUPP
300.0000 mg | Freq: Once | RECTAL | Status: AC
  Administered 2021-11-09: 300 mg via RECTAL
  Filled 2021-11-09: qty 1

## 2021-11-09 MED ORDER — HEPARIN (PORCINE) 25000 UT/250ML-% IV SOLN
1000.0000 [IU]/h | INTRAVENOUS | Status: DC
  Administered 2021-11-09: 950 [IU]/h via INTRAVENOUS
  Administered 2021-11-10: 800 [IU]/h via INTRAVENOUS
  Administered 2021-11-12: 1100 [IU]/h via INTRAVENOUS
  Filled 2021-11-09 (×3): qty 250

## 2021-11-09 MED ORDER — METOPROLOL TARTRATE 5 MG/5ML IV SOLN
5.0000 mg | Freq: Four times a day (QID) | INTRAVENOUS | Status: DC | PRN
  Filled 2021-11-09: qty 5

## 2021-11-09 MED ORDER — METOPROLOL TARTRATE 5 MG/5ML IV SOLN
5.0000 mg | INTRAVENOUS | Status: DC | PRN
  Administered 2021-11-09 – 2021-11-11 (×2): 5 mg via INTRAVENOUS
  Filled 2021-11-09 (×3): qty 5

## 2021-11-09 MED ORDER — GUAIFENESIN-DM 100-10 MG/5ML PO SYRP
10.0000 mL | ORAL_SOLUTION | ORAL | Status: DC | PRN
  Administered 2021-11-11 – 2021-11-19 (×13): 10 mL via ORAL
  Filled 2021-11-09 (×14): qty 10

## 2021-11-09 MED ORDER — HYDRALAZINE HCL 20 MG/ML IJ SOLN
5.0000 mg | INTRAMUSCULAR | Status: DC | PRN
  Administered 2021-11-09: 5 mg via INTRAVENOUS
  Filled 2021-11-09: qty 1

## 2021-11-09 MED ORDER — PREDNISONE 50 MG PO TABS
50.0000 mg | ORAL_TABLET | Freq: Every day | ORAL | Status: DC
  Administered 2021-11-12 – 2021-11-15 (×4): 50 mg via ORAL
  Filled 2021-11-09 (×4): qty 1

## 2021-11-09 MED ORDER — SODIUM CHLORIDE 0.9 % IV SOLN
500.0000 mg | INTRAVENOUS | Status: DC
  Administered 2021-11-09 – 2021-11-12 (×4): 500 mg via INTRAVENOUS
  Filled 2021-11-09 (×4): qty 5

## 2021-11-09 MED ORDER — SODIUM CHLORIDE 0.9 % IV SOLN: INTRAVENOUS | Status: DC

## 2021-11-09 MED ORDER — APIXABAN 5 MG PO TABS
5.0000 mg | ORAL_TABLET | Freq: Two times a day (BID) | ORAL | Status: DC
  Administered 2021-11-09: 5 mg via ORAL
  Filled 2021-11-09: qty 1

## 2021-11-09 NOTE — Progress Notes (Signed)
Pt is doing good on 4L and No bipap is needed at this time. [place] standby.  ?

## 2021-11-09 NOTE — Progress Notes (Signed)
? ?  Echocardiogram ?2D Echocardiogram has been performed. ? ?Beryle Beams ?11/09/2021, 9:10 AM ?

## 2021-11-09 NOTE — Progress Notes (Signed)
Patient seen and examined and agree with plan of care as per my partner who admitted this patient after midnight ? ?88 year old [place] veteran white male ?CAD stenting initially 1997 = DES RCA DES LAD 05/2013-intolerant to beta-blockade secondary to bradycardia-underwent PPM placement 10/2020 [employer] ?AAA with distal dissection previously ?DM TY 2 ?Moderate COPD Gold stage III ?Moderate sensorineural hearing loss followed by [facility] ENT ? ?Patient was diagnosed by home test for COVID 11/06/2021-his wife also tested positive-it appears his wife has some underlying cognitive deficit-they live together independently at home ?He has 3 children ? ?He tells me he started feeling poorly around Friday-no diarrhea no chills no rigors no other issues he has no chest pain he has no vision loss unilateral weakness ?-He has had anorexia however and has not really eaten for the past several days-he is not on any thiazide diuretics and has had no new change in medications ? ?On initial evaluation by EMS his sats were in the upper 70s he had a low-grade fever at home-he was treated with 1 g Tylenol 5 albuterol Atrovent 2 of mag and 125 Solu-Medrol and an IV was placed ?Cefepime vancomycin were started on admission-BiPAP was also initiated in the ED-blood pressure was elevated-conservative fluid strategy was pursued ? ?Sodium was 118-he was mentating well however there for consideration for 3% although given was not initiated ?White count was 25 hemoglobin 12.2 lactic acid 3.3 ?COVID-specific labs not obtained ? ?Assessment/plan-await COVID test ?Continue empiric management including broad-spectrum vancomycin ceftriaxone ?Start empiric remdesivir dependent on whether the COVID test comes back positive- ?We will allow full diet but fluid restrict-we will place restrictions to 1200 cc-sodium is already responding arguing for hypovolemic hypotonic hyponatremia secondary to volume depletion-urine studies will be followed up from this  morning ? ?I will update family as available ? ?Verneita Griffes, MD ?[facility] ?7:16 AM ? ? ?

## 2021-11-09 NOTE — Hospital Course (Addendum)
Phillip Jordan is an 88 y.o. 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 He was diagnosed with COVID-19 on 09/08/2021 and he did follow-up with his PCP was started on Molnupiravir but did not improve so he came into the ED. ?  ?Assessment/Plan: ? ?NSTEMI: ?Patient did have GI bleed so aspirin and Eliquis was on hold.  Was on Imdur, metoprolol.  Not getting Plavix since patient is high risk.  Cardiology was consulted and recommend cardiac cath on an outpatient basis.   ? ?Acute respiratory failure with hypoxia  due to COVID-19 pneumonia: ?Was on 2 L of oxygen incentive spirometry, flutter valve, steroid taper.  Patient had completed 5-day course of IV remdesivir and also received Actemra 11/10/2021.  Chest x-ray on 11/16/2021 showed no acute changes.  Had completed course of IV Rocephin and azithromycin on 11/12/2021 and was on IV Zosyn for possible diverticulitis.    ?  ?Acute blood loss normocytic anemia/lower GI bleed in the setting of acute diverticulitis: ?Hemoglobin on 3/28 was at 7.5.  Had multiple bloody bowel movement during hospitalization and received 2 units of packed RBC.  FOBT was positive.  CT scan of the abdomen pelvis showed acute diverticulitis continue to hold aspirin and Eliquis. Patient did have 1 large melanotic stool  on 11/19/2021 and GI was consulted.  GI has seen the patient and recommended  IV Protonix Did have a small melanotic bowel movement on December 03, 2021   Latest hemoglobin was 10.8.  Was on IV Zosyn for diverticulitis. ? ?Leukocytosis likely due to Acute diverticulitis/duodenitis: ?Was IV Zosyn.  Patient is afebrile.  CT scan of the abdomen pelvis with anasarca and diverticulitis, duodenal thickening, possible chronic pancreatitis.   ?  ?Paroxysmal  atrial fibrillation/sick sinus syndrome status post pacemaker: ?AV paced.  Was on metoprolol.  Eliquis was on hold. ? ?Hypervolemic hyponatremia: ?CT scan with anasarca.  Possible third spacing.  Sodium of 127 >127.  Patient received packed RBC and Lasix. ? ? Chronic combined diastolic and systolic heart failure: ?Last 2D echo showed an EF of 45% with left ventricular function mildly reduced with global hypokinesia in the setting probable NSTEMI.   Received IV Lasix during hospitalization.    Patient was negative balance  for 7484 mL. ? ?Type 2 diabetes, controlled, with retinopathy ([place]) ?Received long-acting and sliding scale insulin during hospitalization.  ? ?Hypokalemia: ?Improved after replacement.  Latest potassium was 3.6. ? ?Deconditioning debility. Seen by physical therapy who recommended home health PT on discharge. ? ?Goals of care.  Patient was DNR.  Palliative care was on board. It appears that rapid response was called in after patient was treated for agitation and was declared death at 2350 when patient was unresponsive and apneic. ?

## 2021-11-09 NOTE — Progress Notes (Addendum)
Patient reviewed after discussing with nurse slight decline in clinical status ?After lunch work of breathing increased and patient felt smothered ?Placed back on BiPAP ?He is able to verbalize to me how he feels-he feels "better" no chest pain ?I do not think realistically he will be able to eat today we will continue NS to correct his sodium ?He is on 12/6 BiPAP at 40% and we may be able to de-escalate this based on blood gases-I will order this for 330 ? ?His troponin, while elevated could be from sepsis/Covid as well as--his ekg is slightly  changed from prior with lateral leads showing slight ST -depressions ? Echo does show a decline in Ef with global hypokinesis-- ?D/w Dr. Orene Desanctis and he feels this is a paced rhythm and doesn't give much credence to the findings at this stage and we wouldn't put him on heparin ? will give 325 ASA, but again this is unlikely angina ? ?We will review if needed later on today ? ?Verneita Griffes, MD ?[facility] ?3:19 PM ? ?

## 2021-11-09 NOTE — TOC Initial Note (Signed)
Transition of Care (TOC) - Initial/Assessment Note  ? ? ?Patient Details  ?Name: Phillip Jordan ?MRN: [id number] ?Date of Birth: [birth year] ? ?Transition of Care (TOC) CM/SW Contact:    ?Tawanna Cooler, RN ?Phone Number: ?11/09/2021, 3:34 PM ? ?Clinical Narrative:                 ? ?From home with spouse.  Covid+.   ?TOC following.  ? ?Expected Discharge Plan: Home/Self Care ?Barriers to Discharge: Continued Medical Work up ? ?Expected Discharge Plan and Services ?Expected Discharge Plan: Home/Self Care ?  ?  ?  ?Living arrangements for the past 2 months: Single Family Home ?                ?    ? ?Prior Living Arrangements/Services ?Living arrangements for the past 2 months: [place] ?Lives with:: Spouse ?Patient language and need for interpreter reviewed:: Yes ?       ?Need for Family Participation in Patient Care: Yes (Comment) ?Care giver support system in place?: Yes (comment) ?  ?Criminal Activity/Legal Involvement Pertinent to Current Situation/Hospitalization: No - Comment as needed ? ?Activities of Daily Living ?  ?ADL Screening (condition at time of admission) ?Patient's cognitive ability adequate to safely complete daily activities?: Yes ?Is the patient deaf or have difficulty hearing?: Yes ?Does the patient have difficulty seeing, even when wearing glasses/contacts?: No ?Does the patient have difficulty concentrating, remembering, or making decisions?: No ?Patient able to express need for assistance with ADLs?: Yes ?Does the patient have difficulty dressing or bathing?: No ?Independently performs ADLs?: Yes (appropriate for developmental age) ?Does the patient have difficulty walking or climbing stairs?: Yes ? ? ?Orientation: : Oriented to Self, Oriented to Place, Oriented to  Time, Oriented to Situation ?Alcohol / Substance Use: Not Applicable ?Psych Involvement: No (comment) ? ?Admission diagnosis:  Hypoxia [R09.02] ?Acute respiratory failure with hypoxia ([place]) [J96.01] ?Dyspnea,  unspecified type [R06.00] ?Acute respiratory failure with hypoxemia ([place]) [J96.01] ?Patient Active Problem List  ? Diagnosis Date Noted  ? CAP (community acquired pneumonia) 11/09/2021  ? Acute respiratory failure with hypoxemia ([place]) 11/09/2021  ? Acute respiratory failure with hypoxia ([place]) 11/04/2021  ? Pacemaker 02/12/2021  ? Opacities of both lungs present on chest x-ray 01/06/2021  ? Tachycardia-bradycardia syndrome ([place]) 10/11/2020  ? SCC (squamous cell carcinoma) 10/16/2019  ? Diabetic neuropathy ([place]) 05/23/2019  ? Hypertension, essential 09/01/2018  ? COPD exacerbation ([place]) 07/13/2016  ? Sinusitis, chronic 11/08/2015  ? Follow-up --------------PCP NOTES 05/02/2015  ? Vertigo 06/26/2014  ? Palpitations 08/29/2013  ? Hoarseness of voice 06/17/2011  ? Annual physical exam 05/26/2011  ? BUNDLE BRANCH BLOCK, RIGHT 01/10/2009  ? Allergic rhinitis 08/16/2007  ? Type 2 diabetes, controlled, with retinopathy ([place]) 06/30/2007  ? Hyperlipidemia 06/30/2007  ? CAD (coronary artery disease) 06/30/2007  ? Dissection of aorta ([place]) 06/30/2007  ? COPD GOLD III 06/30/2007  ? ?PCP:  Colon Branch, MD ?Pharmacy:   ?[facility], [place] ?[address] ?[place] [address] ?Phone: [phone] Fax: [phone] ? ?[place], [address] ?[phone] ?[place] [address] ?Phone: [phone] Fax: [phone] ? ? ?Readmission Risk Interventions ?No flowsheet data found. ? ? ?

## 2021-11-09 NOTE — H&P (Addendum)
History and Physical    [name]:[id number] DOB: 1929/09/15 DOA: 11/08/2021  PCP: Wanda Plump, MD  Patient coming from: Home.  Chief Complaint: Shortness of breath.  HPI: Phillip Jordan is a 88 y.o. male with history of CAD status post stenting, diabetes mellitus type 2, COPD, hypertension, pacemaker placement for tachybradycardia syndrome, history of atrial flutter status post ablation on Eliquis was experiencing shortness of breath with cough at home and on November 06, 2021 of he was diagnosed with COVID-positive at home.  He had followed with his primary care physician subsequently and was prescribed Molnupiravir.  First dose of which he took yesterday.  Due to worsening shortness of breath EMS was called and patient was brought to the ER.  Patient states he has been productive cough did record some fever at home but denies any chest pain.  Denies any nausea vomiting or diarrhea.  Patient states over the last 4 days he has been and sore throat but no difficulty swallowing.  ED Course: In the ER patient was acutely short of breath and placed on BiPAP.  Chest x-ray shows worsening infiltrates on the left side concerning for pneumonia.  Patient had blood cultures drawn and started on empiric antibiotics for community-acquired pneumonia.  COVID test at the hospital still pending.  EKG shows sinus tachycardia.  Labs also show sodium of 118 with elevated BNP of 695 high sensitive troponin 90.  Patient admitted for acute respiratory failure with hypoxia with pneumonia COVID test pending and elevated BNP low sodium.  Review of Systems: As per HPI, rest all negative.   Past Medical History:  Diagnosis Date   Allergic rhinitis    Aortic dissection (HCC)    f/u by cards - AAA with distal dissection.   CAD (coronary artery disease)    a. s/p MI 1992. b. Prior hx stenting to RCA/Cx. b. 05/2013: [place] s/p DES to distal RCA then staged DES to prox LAD.    COPD with asthma (HCC)    Diabetes  mellitus    Hyperlipidemia    Lichen planus    Right midline inferior chest, Dr. Arminda Resides   SCC (squamous cell carcinoma)    Shingles     Past Surgical History:  Procedure Laterality Date   BACK SURGERY  1993   CARDIAC CATHETERIZATION     left, w/ coronary angiography and left ventriculograpy   CARDIOVERSION N/A 09/05/2020   Procedure: CARDIOVERSION;  Surgeon: Wendall Stade, MD;  Location: [facility] ENDOSCOPY;  Service: Cardiovascular;  Laterality: N/A;   LEFT HEART CATHETERIZATION WITH CORONARY ANGIOGRAM N/A 06/09/2013   Procedure: LEFT HEART CATHETERIZATION WITH CORONARY ANGIOGRAM;  Surgeon: Wendall Stade, MD;  Location: [facility] CATH LAB;  Service: Cardiovascular;  Laterality: N/A;   PACEMAKER IMPLANT N/A 11/06/2020   Procedure: PACEMAKER IMPLANT;  Surgeon: Marinus Maw, MD;  Location: [facility] INVASIVE CV LAB;  Service: Cardiovascular;  Laterality: N/A;   PERCUTANEOUS CORONARY STENT INTERVENTION (PCI-S) N/A 06/12/2013   Procedure: PERCUTANEOUS CORONARY STENT INTERVENTION (PCI-S);  Surgeon: Kathleene Hazel, MD;  Location: [facility] CATH LAB;  Service: Cardiovascular;  Laterality: N/A;  prox LAD   PTCA     w/ placement of drugeluting stent in the distal right coronary artery, and distal Cardiologist: Loraine Leriche W.Pulsipher     reports that he quit smoking about 29 years ago. His smoking use included cigarettes. He has a 58.50 pack-year smoking history. He has never used smokeless tobacco. He reports that he does not drink alcohol and  does not use drugs.  No Known Allergies  Family History  Problem Relation Age of Onset   Asthma Mother    Heart disease Mother        CHF   Cancer Mother        melanoma   Heart disease Father        MI   Prostate cancer Neg Hx    Colon cancer Neg Hx     Prior to Admission medications   Medication Sig Start Date End Date Taking? Authorizing Provider  albuterol (PROAIR HFA) 108 (90 Base) MCG/ACT inhaler 2 puffs up to every 4 hours as needed only  if your  can't catch your breath 08/12/21  Yes Nyoka Cowden, MD  apixaban (ELIQUIS) 5 MG TABS tablet Take 1 tablet (5 mg total) by mouth 2 (two) times daily. 07/29/20  Yes Wendall Stade, MD  aspirin EC 81 MG tablet Take 81 mg by mouth every evening. Swallow whole.   Yes [provider]  dextromethorphan-guaiFENesin (MUCINEX DM) 30-600 MG 12hr tablet Take 1 tablet by mouth 2 (two) times daily as needed for cough.   Yes [provider]  fluticasone (FLONASE) 50 MCG/ACT nasal spray Place 2 sprays into both nostrils daily as needed for allergies or rhinitis.   Yes [provider]  fluticasone (FLOVENT HFA) 220 MCG/ACT inhaler Inhale 2 puffs into the lungs 2 (two) times daily. 10/06/21  Yes Hunsucker, Lesia Sago, MD  isosorbide mononitrate (IMDUR) 30 MG 24 hr tablet Take 1 tablet by mouth twice daily Patient taking differently: Take 30 mg by mouth daily. 07/11/21  Yes [name], [name], MD  LAGEVRIO 200 MG CAPS capsule Take 4 capsules by mouth every 12 (twelve) hours. 11/07/21  Yes [provider]  metFORMIN (GLUCOPHAGE) 500 MG tablet Take 1 tablet by mouth once daily with breakfast Patient taking differently: Take 500 mg by mouth daily with breakfast. 10/31/21  Yes Wanda Plump, MD  metoprolol succinate (TOPROL-XL) 25 MG 24 hr tablet Take 0.5 tablets (12.5 mg total) by mouth daily. (Replaces metoprolol TARTRATE) 08/11/21  Yes Wanda Plump, MD  Multiple Vitamin (MULTIVITAMIN WITH MINERALS) TABS tablet Take 1 tablet by mouth daily.   Yes [provider]  ondansetron (ZOFRAN) 4 MG tablet Take 1 tablet (4 mg total) by mouth 2 (two) times daily as needed for nausea or vomiting. 10/02/21  Yes [name], [name], MD  pravastatin (PRAVACHOL) 40 MG tablet Take 1 tablet by mouth once daily Patient taking differently: Take 40 mg by mouth every evening. 04/24/21  Yes [name], [name], MD  Tiotropium Bromide-Olodaterol (STIOLTO RESPIMAT) 2.5-2.5 MCG/ACT AERS Inhale 2 puffs into the lungs daily. 02/25/21   Yes Nyoka Cowden, MD  vitamin B-12 (CYANOCOBALAMIN) 100 MCG tablet Take 100 mcg by mouth daily.   Yes [provider]  Fluticasone-Umeclidin-Vilant (TRELEGY ELLIPTA) 100-62.5-25 MCG/ACT AEPB Inhale 1 puff into the lungs daily at 6 (six) AM. Patient not taking: Reported on 10/06/2021 09/17/21   Bevelyn Ngo, NP  glucose blood (FREESTYLE TEST STRIPS) test strip Check blood sugar no more than twice daily 06/19/20   Wanda Plump, MD  nitroGLYCERIN (NITROSTAT) 0.4 MG SL tablet Place 1 tablet (0.4 mg total) under the tongue every 5 (five) minutes as needed for chest pain (up to 3 doses only.). Patient not taking: Reported on 12-03-2021 12/30/18   Wendall Stade, MD    Physical Exam: Constitutional: Moderately built and nourished. Vitals:   December 03, 2021 2215 2021-12-03  2230 11/23/21 2315 2021-11-23 2330  BP: 136/63 137/71 (!) 120/59 107/62  Pulse: (!) 103 (!) 104 91 89  Resp: (!) 22 (!) 23 19 19   Temp:      TempSrc:      SpO2: 98% 100% 99% 99%  Weight:      Height:       Eyes: Anicteric no pallor. ENMT: No discharge from the ears eyes nose and mouth. Neck: No mass felt.  No neck rigidity. Respiratory: No rhonchi or crepitations. Cardiovascular: S1-S2 heard. Abdomen: Soft nontender bowel sound present. Musculoskeletal: No edema. Skin: No rash. Neurologic: Alert awake oriented to his name place and person.  Moving all extremities. Psychiatric: Appears normal.  Normal affect.   Labs on Admission: I have personally reviewed following labs and imaging studies  CBC: Recent Labs  Lab 2021/11/23 2100  WBC 25.3*  NEUTROABS 22.5*  HGB 14.3  HCT 41.5  MCV 87.7  PLT 297   Basic Metabolic Panel: Recent Labs  Lab 11/23/2021 2100  NA 118*  K 3.8  CL 83*  CO2 24  GLUCOSE 235*  BUN 13  CREATININE 0.72  CALCIUM 8.2*   GFR: Estimated Creatinine Clearance: 62.6 mL/min (by C-G formula based on SCr of 0.72 mg/dL). Liver Function Tests: Recent Labs  Lab 11-23-21 2100  AST 29  ALT 17   ALKPHOS 78  BILITOT 0.6  PROT 7.2  ALBUMIN 3.9   No results for input(s): LIPASE, AMYLASE in the last 168 hours. No results for input(s): AMMONIA in the last 168 hours. Coagulation Profile: Recent Labs  Lab 11/23/2021 2100  INR 1.3*   Cardiac Enzymes: No results for input(s): CKTOTAL, CKMB, CKMBINDEX, TROPONINI in the last 168 hours. BNP (last 3 results) No results for input(s): PROBNP in the last 8760 hours. HbA1C: No results for input(s): HGBA1C in the last 72 hours. CBG: No results for input(s): GLUCAP in the last 168 hours. Lipid Profile: No results for input(s): CHOL, HDL, LDLCALC, TRIG, CHOLHDL, LDLDIRECT in the last 72 hours. Thyroid Function Tests: No results for input(s): TSH, T4TOTAL, FREET4, T3FREE, THYROIDAB in the last 72 hours. Anemia Panel: No results for input(s): VITAMINB12, FOLATE, FERRITIN, TIBC, IRON, RETICCTPCT in the last 72 hours. Urine analysis:    Component Value Date/Time   COLORURINE YELLOW 09/25/2021 1114   APPEARANCEUR Cloudy (A) 09/25/2021 1114   LABSPEC 1.010 09/25/2021 1114   PHURINE 6.0 09/25/2021 1114   GLUCOSEU NEGATIVE 09/25/2021 1114   HGBUR TRACE-INTACT (A) 09/25/2021 1114   HGBUR negative 09/28/2007 1526   BILIRUBINUR NEGATIVE 09/25/2021 1114   BILIRUBINUR neg 06/07/2018 0911   KETONESUR NEGATIVE 09/25/2021 1114   PROTEINUR Positive (A) 06/07/2018 0911   UROBILINOGEN 0.2 09/25/2021 1114   NITRITE POSITIVE (A) 09/25/2021 1114   LEUKOCYTESUR MODERATE (A) 09/25/2021 1114   Sepsis Labs: @LABRCNTIP (procalcitonin:4,lacticidven:4) )No results found for this or any previous visit (from the past 240 hour(s)).   Radiological Exams on Admission: DG Chest Port 1 View  Result Date: 11/23/21 CLINICAL DATA:  Shortness of breath and dyspnea. EXAM: PORTABLE CHEST 1 VIEW COMPARISON:  Chest x-ray 12/27/2020 FINDINGS: Left-sided pacemaker is again seen. There are increasing patchy airspace opacities in the left lung base. There is no pleural  effusion or pneumothorax. The cardiomediastinal silhouette is within normal limits. No acute fractures. IMPRESSION: 1. Increasing left lower lobe airspace disease worrisome for pneumonia. Follow-up chest x-ray recommended in 4 6 weeks to confirm resolution. Electronically Signed   By: Darliss Cheney M.D.   On: 11-23-21 21:34  EKG: Independently reviewed.  Sinus tachycardia.  Assessment/Plan Principal Problem:   Acute respiratory failure with hypoxia (HCC) Active Problems:   Type 2 diabetes, controlled, with retinopathy (HCC)   CAD (coronary artery disease)   COPD GOLD III   Hypertension, essential   CAP (community acquired pneumonia)   Acute respiratory failure with hypoxemia (HCC)    Acute respiratory failure with hypoxia presently on BiPAP with chest x-ray showing infiltrates concerning for worsening pneumonia for which patient has been started on empiric antibiotics for community-acquired pneumonia given the leukocytosis.  Cultures have been obtained.  COVID test is pending.  If positive will need to check inflammatory markers and start on medicine for COVID specific.  Patient's BNP is also elevated but clinically does not look fluid overloaded.  Will check 2D echo. Hyponatremia -cause not clear.  We will check urine studies and trend metabolic panel.  Patient was given 1 L fluid bolus in the ER.  We will have further plans based on the urine studies and sodium trends.  In addition we will check cortisol and TSH levels. CAD status post stenting denies any chest pain we will trend cardiac markers.  Continue Eliquis aspirin Imdur statins and beta-blockers. History of atrial flutter status post ablation and history of tachybradycardia syndrome status post pacemaker placement presently on Eliquis and beta-blockers. Diabetes mellitus type 2 we will keep patient on sliding scale coverage. COPD presently not actively wheezing we will continue inhalers.  COVID test is pending.  Since patient  has acute respiratory failure initially requiring BiPAP with significant leukocytosis severe hyponatremia will need inpatient status.   DVT prophylaxis: Apixaban. Code Status: Full code. Family Communication: Patient's wife. Disposition Plan: Home. Consults called: Pulmonary critical care. Admission status: Inpatient.   Eduard Clos MD [facility] Pager [phone].  If 7PM-7AM, please contact night-coverage [email or web address] Password [facility]  11/09/2021, 12:24 AM

## 2021-11-09 NOTE — Plan of Care (Signed)
?  Problem: Education: ?Goal: Knowledge of General Education information will improve ?Description: Including pain rating scale, medication(s)/side effects and non-pharmacologic comfort measures ?Outcome: Progressing ?  ?Problem: Coping: Spoke with daughter and spouse to encourage and lift their spirits because of their emotional breakdown. ?Goal: Level of anxiety will decrease ?Outcome: Progressing ?  ?Problem: Activity: ?Goal: Risk for activity intolerance will decrease Patient tries to extend his tolerance off the Bi Pap and continues to perform his insensitive spirometer ?Outcome: Not Progressing Patient continues to desat to the 70 when not on his BiPap.  ?  ?

## 2021-11-09 NOTE — Progress Notes (Signed)
Patient began experiencing shortness of breath and nausea. Respiratory therapist and MD came to bedside. New orders received from MD.  ?

## 2021-11-09 NOTE — Progress Notes (Signed)
ANTICOAGULATION CONSULT NOTE ? ?Pharmacy Consult for IV heparin ?Indication: chest pain/ACS ? ?No Known Allergies ? ?Patient Measurements: ?Height: '5\' 9"'$  (175.3 cm) ?Weight: 76.2 kg (167 lb 15.9 oz) ?IBW/kg (Calculated) : 70.7 ?Heparin Dosing Weight: TBW ? ?Vital Signs: ?Temp: 97.7 ?F (36.5 ?C) (03/19 1333) ?Temp Source: Axillary (03/19 1333) ?BP: 182/116 (03/19 1600) ?Pulse Rate: 113 (03/19 1600) ? ?Labs: ?Recent Labs  ?  10/28/2021 ?2100 10/29/2021 ?2300 11/09/21 ?0019 11/09/21 ?0055 11/09/21 ?0253 11/09/21 ?6644 11/09/21 ?0347 11/09/21 ?1525  ?HGB 14.3  --   --  12.2*  --   --   --   --   ?HCT 41.5  --   --  35.2*  --   --   --   --   ?PLT 297  --   --  243  --   --   --   --   ?LABPROT 16.1*  --   --   --   --   --   --   --   ?INR 1.3*  --   --   --   --   --   --   --   ?CREATININE 0.72  --  0.98  --   --  0.83 0.77  --   ?TROPONINIHS 90*   < > 294*  --  290*  --   --  1,601*  ? < > = values in this interval not displayed.  ? ? ?Estimated Creatinine Clearance: 62.6 mL/min (by C-G formula based on SCr of 0.77 mg/dL). ? ? ?Medical History: ?Past Medical History:  ?Diagnosis Date  ? Allergic rhinitis   ? Aortic dissection ([place])   ? f/u by cards - AAA with distal dissection.  ? CAD (coronary artery disease)   ? a. s/p MI 1992. b. Prior hx stenting to RCA/Cx. b. 05/2013: [place] s/p DES to distal RCA then staged DES to prox LAD.   ? COPD with asthma ([place])   ? Diabetes mellitus   ? Hyperlipidemia   ? Lichen planus   ? Right midline inferior chest, Dr. [name] Sensing  ? SCC (squamous cell carcinoma)   ? Shingles   ? ? ?Medications:  ?Medications Prior to Admission  ?Medication Sig Dispense Refill Last Dose  ? albuterol (PROAIR HFA) 108 (90 Base) MCG/ACT inhaler 2 puffs up to every 4 hours as needed only  if your can't catch your breath 1 each 11 10/25/2021  ? apixaban (ELIQUIS) 5 MG TABS tablet Take 1 tablet (5 mg total) by mouth 2 (two) times daily. 60 tablet 11 11/10/2021 at 1800  ? aspirin EC 81 MG tablet Take 81 mg by mouth  every evening. Swallow whole.   11/07/2021 at eve  ? dextromethorphan-guaiFENesin (MUCINEX DM) 30-600 MG 12hr tablet Take 1 tablet by mouth 2 (two) times daily as needed for cough.   11/19/2021  ? fluticasone (FLONASE) 50 MCG/ACT nasal spray Place 2 sprays into both nostrils daily as needed for allergies or rhinitis.   Past Week  ? fluticasone (FLOVENT HFA) 220 MCG/ACT inhaler Inhale 2 puffs into the lungs 2 (two) times daily. 1 each 0 11/13/2021  ? isosorbide mononitrate (IMDUR) 30 MG 24 hr tablet Take 1 tablet by mouth twice daily (Patient taking differently: Take 30 mg by mouth daily.) 180 tablet 1 11/11/2021  ? LAGEVRIO 200 MG CAPS capsule Take 4 capsules by mouth every 12 (twelve) hours.   10/30/2021  ? metFORMIN (GLUCOPHAGE) 500 MG tablet Take 1 tablet by mouth once  daily with breakfast (Patient taking differently: Take 500 mg by mouth daily with breakfast.) 90 tablet 1 10/25/2021  ? metoprolol succinate (TOPROL-XL) 25 MG 24 hr tablet Take 0.5 tablets (12.5 mg total) by mouth daily. (Replaces metoprolol TARTRATE) 15 tablet 1 10/27/2021 at 1800  ? Multiple Vitamin (MULTIVITAMIN WITH MINERALS) TABS tablet Take 1 tablet by mouth daily.   11/10/2021  ? ondansetron (ZOFRAN) 4 MG tablet Take 1 tablet (4 mg total) by mouth 2 (two) times daily as needed for nausea or vomiting. 20 tablet 0 Past Month  ? pravastatin (PRAVACHOL) 40 MG tablet Take 1 tablet by mouth once daily (Patient taking differently: Take 40 mg by mouth every evening.) 90 tablet 1 11/07/2021  ? Tiotropium Bromide-Olodaterol (STIOLTO RESPIMAT) 2.5-2.5 MCG/ACT AERS Inhale 2 puffs into the lungs daily. 1 each 11 11/16/2021  ? vitamin B-12 (CYANOCOBALAMIN) 100 MCG tablet Take 100 mcg by mouth daily.   11/11/2021  ? Fluticasone-Umeclidin-Vilant (TRELEGY ELLIPTA) 100-62.5-25 MCG/ACT AEPB Inhale 1 puff into the lungs daily at 6 (six) AM. (Patient not taking: Reported on 10/06/2021) 2 each 0 Not Taking  ? glucose blood (FREESTYLE TEST STRIPS) test strip Check blood sugar  no more than twice daily 200 each 12   ? nitroGLYCERIN (NITROSTAT) 0.4 MG SL tablet Place 1 tablet (0.4 mg total) under the tongue every 5 (five) minutes as needed for chest pain (up to 3 doses only.). (Patient not taking: Reported on 11/02/2021) 25 tablet 3 Not Taking  ? ?Scheduled:  ? apixaban  5 mg Oral BID  ? [START ON 11/10/2021] aspirin EC  81 mg Oral QPM  ? aspirin  300 mg Rectal Once  ? aspirin  325 mg Oral Once  ? chlorhexidine  15 mL Mouth Rinse BID  ? Chlorhexidine Gluconate Cloth  6 each Topical Daily  ? insulin aspart  0-9 Units Subcutaneous TID WC  ? isosorbide mononitrate  30 mg Oral Daily  ? mouth rinse  15 mL Mouth Rinse q12n4p  ? methylPREDNISolone (SOLU-MEDROL) injection  80 mg Intravenous Q12H  ? Followed by  ? [START ON 11/12/2021] predniSONE  50 mg Oral Daily  ? nitroGLYCERIN  0.5 inch Topical Q8H  ? ?PRN: acetaminophen **OR** acetaminophen, chlorpheniramine-HYDROcodone, guaiFENesin-dextromethorphan, metoprolol tartrate, nitroGLYCERIN, prochlorperazine ? ?Assessment: ?38 yoM w/ PMH CAD s/p PCI '97, DM2, COPD, AAA with Hx distal dissection, admitted for COVID PNA. Noted to have some chest pain and rising troponins today, and Pharmacy consulted to dose heparin for ACS. ? ?Discussed w/ MD regarding Eliquis administration < 12 hrs prior to heparin start; confirmed acceptable to proceed with heparin infusion for ACS but no bolus. ? ?Baseline INR slightly elevated d/t Eliquis, aPTT not done ?Prior anticoagulation: Eliquis 5 mg PO bid, LD 3/19 at 0921 ? ?Significant events: ? ?Today, 11/09/2021: ?CBC: Hgb slightly low after fluids; Plt stable WNL ?No baseline heparin level; will assume elevated d/t Eliquis dose this AM ?No bleeding or infusion issues per nursing ?SCr at baseline, WNL ? ?Goal of Therapy: ?Heparin level 0.3-0.7 units/ml ?Monitor platelets by anticoagulation protocol: Yes ? ?Plan: ?Heparin 950 units/hr IV infusion, no bolus ?Check heparin level 8 hrs after start ?Daily CBC, daily heparin  level once stable ?Monitor for signs of bleeding or thrombosis ? ?Reuel Boom, PharmD, BCPS ?[phone] ?11/09/2021, 5:03 PM ? ? ? ? ?

## 2021-11-10 ENCOUNTER — Telehealth: Payer: Self-pay

## 2021-11-10 DIAGNOSIS — J9601 Acute respiratory failure with hypoxia: Secondary | ICD-10-CM | POA: Diagnosis not present

## 2021-11-10 DIAGNOSIS — I4892 Unspecified atrial flutter: Secondary | ICD-10-CM | POA: Diagnosis not present

## 2021-11-10 DIAGNOSIS — I5043 Acute on chronic combined systolic (congestive) and diastolic (congestive) heart failure: Secondary | ICD-10-CM | POA: Diagnosis not present

## 2021-11-10 DIAGNOSIS — I214 Non-ST elevation (NSTEMI) myocardial infarction: Secondary | ICD-10-CM | POA: Diagnosis not present

## 2021-11-10 LAB — CBC WITH DIFFERENTIAL/PLATELET
Abs Immature Granulocytes: 0.13 10*3/uL — ABNORMAL HIGH (ref 0.00–0.07)
Basophils Absolute: 0 10*3/uL (ref 0.0–0.1)
Basophils Relative: 0 %
Eosinophils Absolute: 0 10*3/uL (ref 0.0–0.5)
Eosinophils Relative: 0 %
HCT: 43.3 % (ref 39.0–52.0)
Hemoglobin: 14.5 g/dL (ref 13.0–17.0)
Immature Granulocytes: 1 %
Lymphocytes Relative: 2 %
Lymphs Abs: 0.4 10*3/uL — ABNORMAL LOW (ref 0.7–4.0)
MCH: 30.5 pg (ref 26.0–34.0)
MCHC: 33.5 g/dL (ref 30.0–36.0)
MCV: 91.2 fL (ref 80.0–100.0)
Monocytes Absolute: 1.4 10*3/uL — ABNORMAL HIGH (ref 0.1–1.0)
Monocytes Relative: 6 %
Neutro Abs: 21.2 10*3/uL — ABNORMAL HIGH (ref 1.7–7.7)
Neutrophils Relative %: 91 %
Platelets: 286 10*3/uL (ref 150–400)
RBC: 4.75 MIL/uL (ref 4.22–5.81)
RDW: 13.2 % (ref 11.5–15.5)
WBC: 23.1 10*3/uL — ABNORMAL HIGH (ref 4.0–10.5)
nRBC: 0 % (ref 0.0–0.2)

## 2021-11-10 LAB — COMPREHENSIVE METABOLIC PANEL
ALT: 31 U/L (ref 0–44)
AST: 73 U/L — ABNORMAL HIGH (ref 15–41)
Albumin: 3.5 g/dL (ref 3.5–5.0)
Alkaline Phosphatase: 75 U/L (ref 38–126)
Anion gap: 10 (ref 5–15)
BUN: 23 mg/dL (ref 8–23)
CO2: 23 mmol/L (ref 22–32)
Calcium: 8.9 mg/dL (ref 8.9–10.3)
Chloride: 94 mmol/L — ABNORMAL LOW (ref 98–111)
Creatinine, Ser: 0.97 mg/dL (ref 0.61–1.24)
GFR, Estimated: >60 mL/min (ref >60)
Glucose, Bld: 185 mg/dL — ABNORMAL HIGH (ref 70–99)
Potassium: 5.3 mmol/L — ABNORMAL HIGH (ref 3.5–5.1)
Sodium: 127 mmol/L — ABNORMAL LOW (ref 135–145)
Total Bilirubin: 0.3 mg/dL (ref 0.3–1.2)
Total Protein: 7.1 g/dL (ref 6.5–8.1)

## 2021-11-10 LAB — BASIC METABOLIC PANEL
Anion gap: 8 (ref 5–15)
BUN: 33 mg/dL — ABNORMAL HIGH (ref 8–23)
CO2: 26 mmol/L (ref 22–32)
Calcium: 8.7 mg/dL — ABNORMAL LOW (ref 8.9–10.3)
Chloride: 97 mmol/L — ABNORMAL LOW (ref 98–111)
Creatinine, Ser: 0.87 mg/dL (ref 0.61–1.24)
GFR, Estimated: >60 mL/min (ref >60)
Glucose, Bld: 205 mg/dL — ABNORMAL HIGH (ref 70–99)
Potassium: 4.3 mmol/L (ref 3.5–5.1)
Sodium: 131 mmol/L — ABNORMAL LOW (ref 135–145)

## 2021-11-10 LAB — APTT
aPTT: 73 seconds — ABNORMAL HIGH (ref 24–36)
aPTT: 117 seconds — ABNORMAL HIGH (ref 24–36)
aPTT: 72 seconds — ABNORMAL HIGH (ref 24–36)

## 2021-11-10 LAB — GLUCOSE, CAPILLARY
Glucose-Capillary: 206 mg/dL — ABNORMAL HIGH (ref 70–99)
Glucose-Capillary: 174 mg/dL — ABNORMAL HIGH (ref 70–99)
Glucose-Capillary: 175 mg/dL — ABNORMAL HIGH (ref 70–99)
Glucose-Capillary: 224 mg/dL — ABNORMAL HIGH (ref 70–99)
Glucose-Capillary: 228 mg/dL — ABNORMAL HIGH (ref 70–99)

## 2021-11-10 LAB — D-DIMER, QUANTITATIVE: D-Dimer, Quant: 1.27 ug/mL-FEU — ABNORMAL HIGH (ref 0.00–0.50)

## 2021-11-10 LAB — TROPONIN I (HIGH SENSITIVITY): Troponin I (High Sensitivity): 6988 ng/L (ref <18)

## 2021-11-10 LAB — HEPARIN LEVEL (UNFRACTIONATED): Heparin Unfractionated: >1.1 IU/mL — ABNORMAL HIGH (ref 0.30–0.70)

## 2021-11-10 LAB — C-REACTIVE PROTEIN: CRP: 22.7 mg/dL — ABNORMAL HIGH (ref <1.0)

## 2021-11-10 MED ORDER — ASPIRIN 81 MG PO CHEW
81.0000 mg | CHEWABLE_TABLET | Freq: Once | ORAL | Status: AC
  Administered 2021-11-10: 81 mg via ORAL
  Filled 2021-11-10: qty 1

## 2021-11-10 MED ORDER — SODIUM CHLORIDE 0.9 % IV SOLN
8.0000 mg/kg | Freq: Once | INTRAVENOUS | Status: AC
  Administered 2021-11-10: 610 mg via INTRAVENOUS
  Filled 2021-11-10: qty 30.5

## 2021-11-10 MED ORDER — ASPIRIN EC 81 MG PO TBEC
81.0000 mg | DELAYED_RELEASE_TABLET | Freq: Every evening | ORAL | Status: DC
  Administered 2021-11-11 – 2021-11-17 (×7): 81 mg via ORAL
  Filled 2021-11-10 (×7): qty 1

## 2021-11-10 MED ORDER — DEXTROSE-NACL 5-0.9 % IV SOLN: INTRAVENOUS | Status: DC

## 2021-11-10 NOTE — Progress Notes (Signed)
Nurse took the Pt's BIPAP off about 0840 and placed  him on 5l ?

## 2021-11-10 NOTE — Telephone Encounter (Signed)
Nurse Assessment ?Nurse: Aram Candela, RN, [place] Date/Time Eilene Ghazi Time): 11/18/2021 2:25:24 PM ?Confirm and document reason for call. If ?symptomatic, describe symptoms. ?---Patient states that he has shortness of breath and ?congestion. COVID positive yesterday. ?Does the patient have any new or worsening ?symptoms? ---Yes ?Will a triage be completed? ---Yes ?Related visit to physician within the last 2 weeks? ---No ?Does the PT have any chronic conditions? (i.e. ?diabetes, asthma, this includes High risk factors for ?pregnancy, etc.) ?---Yes ?List chronic conditions. ---COPD, heart attack w stents, pacemaker ?Is this a behavioral health or substance abuse call? ---No ?Guidelines ?Guideline Title Affirmed Question Affirmed Notes Nurse Date/Time (Eastern ?Time) ?COVID-19 - ?Diagnosed or ?Suspected ?MODERATE ?difficulty breathing ?(e.g., speaks in ?phrases, SOB even at ?rest, pulse 100-120) ?Aram Candela, RN, [place] 11/07/2021 2:30:51 ?PM ?Disp. Time (Eastern ?Time) Disposition Final User ?11/07/2021 2:23:25 PM Send to Urgent Queue [place] ?PLEASE NOTE: All timestamps contained within this report are represented as [place] Standard Time. ?CONFIDENTIALTY NOTICE: This fax transmission is intended only for the addressee. It contains information that is legally privileged, confidential or ?otherwise protected from use or disclosure. If you are not the intended recipient, you are strictly prohibited from reviewing, disclosing, copying using ?or disseminating any of this information or taking any action in reliance on or regarding this information. If you have received this fax in error, please ?notify us immediately by telephone so that we can arrange for its return to [place]. Phone: [phone], Toll-Free: [phone], Fax: [phone] ?Page: 2 of 2 ?Call Id: [id number] ?11/07/2021 2:35:35 PM Go to ED Now Yes Aram Candela, RN, [place] ?Caller Disagree/Comply Comply ?Caller Understands Yes ?PreDisposition InappropriateToAsk ?Care  Advice Given Per Guideline ?GO TO ED NOW: * You need to be seen in the Emergency Department. * Go to the ED at ___________ [place] now. ?Drive carefully. CALL EMS 911 IF: * Severe difficulty breathing occurs * Lips or face turns blue * Confusion occurs. CARE ?ADVICE given per COVID-19 - DIAGNOSED OR SUSPECTED (Adult) guideline. ANOTHER ADULT SHOULD DRIVE: * It is ?better and safer if another adult drives instead of you. ?Referrals ?[facility] - ED ?

## 2021-11-10 NOTE — Progress Notes (Signed)
ANTICOAGULATION CONSULT NOTE ? ?Pharmacy Consult for IV heparin ?Indication: chest pain/ACS ? ?No Known Allergies ? ?Patient Measurements: ?Height: '5\' 9"'$  (175.3 cm) ?Weight: 76.2 kg (167 lb 15.9 oz) ?IBW/kg (Calculated) : 70.7 ?Heparin Dosing Weight: TBW ? ?Vital Signs: ?Temp: 97.1 ?F (36.2 ?C) (03/20 0800) ?Temp Source: Axillary (03/20 0800) ?BP: 139/64 (03/20 1128) ?Pulse Rate: 79 (03/20 1128) ? ?Labs: ?Recent Labs  ?  11/12/2021 ?2100 11/11/2021 ?2300 11/09/21 ?0055 11/09/21 ?0253 11/09/21 ?6720 11/09/21 ?9470 11/09/21 ?1525 11/09/21 ?1809 11/10/21 ?9628 11/10/21 ?0423 11/10/21 ?1053  ?HGB 14.3  --  12.2*  --   --   --   --   --  14.5  --   --   ?HCT 41.5  --  35.2*  --   --   --   --   --  43.3  --   --   ?PLT 297  --  243  --   --   --   --   --  286  --   --   ?APTT  --   --   --   --   --   --   --   --  73*  --  117*  ?LABPROT 16.1*  --   --   --   --   --   --   --   --   --   --   ?INR 1.3*  --   --   --   --   --   --   --   --   --   --   ?HEPARINUNFRC  --   --   --   --   --   --   --   --  >1.10*  --   --   ?CREATININE 0.72   < >  --   --  0.83 0.77  --   --  0.97  --   --   ?TROPONINIHS 90*   < >  --    < >  --   --  1,601* 2,823*  --  6,988*  --   ? < > = values in this interval not displayed.  ? ? ? ?Estimated Creatinine Clearance: 51.6 mL/min (by C-G formula based on SCr of 0.97 mg/dL). ? ? ?Medical History: ?Past Medical History:  ?Diagnosis Date  ? Allergic rhinitis   ? Aortic dissection ([place])   ? f/u by cards - AAA with distal dissection.  ? CAD (coronary artery disease)   ? a. s/p MI 1992. b. Prior hx stenting to RCA/Cx. b. 05/2013: [place] s/p DES to distal RCA then staged DES to prox LAD.   ? COPD with asthma ([place])   ? Diabetes mellitus   ? Hyperlipidemia   ? Lichen planus   ? Right midline inferior chest, Dr. [name] Sensing  ? SCC (squamous cell carcinoma)   ? Shingles   ? ? ?Medications:  ?Medications Prior to Admission  ?Medication Sig Dispense Refill Last Dose  ? albuterol (PROAIR HFA) 108 (90  Base) MCG/ACT inhaler 2 puffs up to every 4 hours as needed only  if your can't catch your breath 1 each 11 11/01/2021  ? apixaban (ELIQUIS) 5 MG TABS tablet Take 1 tablet (5 mg total) by mouth 2 (two) times daily. 60 tablet 11 11/05/2021 at 1800  ? aspirin EC 81 MG tablet Take 81 mg by mouth every evening. Swallow whole.   11/07/2021 at eve  ? dextromethorphan-guaiFENesin [facility]  DM) 30-600 MG 12hr tablet Take 1 tablet by mouth 2 (two) times daily as needed for cough.   11/05/2021  ? fluticasone (FLONASE) 50 MCG/ACT nasal spray Place 2 sprays into both nostrils daily as needed for allergies or rhinitis.   Past Week  ? fluticasone (FLOVENT HFA) 220 MCG/ACT inhaler Inhale 2 puffs into the lungs 2 (two) times daily. 1 each 0 10/23/2021  ? isosorbide mononitrate (IMDUR) 30 MG 24 hr tablet Take 1 tablet by mouth twice daily (Patient taking differently: Take 30 mg by mouth daily.) 180 tablet 1 11/03/2021  ? LAGEVRIO 200 MG CAPS capsule Take 4 capsules by mouth every 12 (twelve) hours.   11/13/2021  ? metFORMIN (GLUCOPHAGE) 500 MG tablet Take 1 tablet by mouth once daily with breakfast (Patient taking differently: Take 500 mg by mouth daily with breakfast.) 90 tablet 1 11/02/2021  ? metoprolol succinate (TOPROL-XL) 25 MG 24 hr tablet Take 0.5 tablets (12.5 mg total) by mouth daily. (Replaces metoprolol TARTRATE) 15 tablet 1 11/16/2021 at 1800  ? Multiple Vitamin (MULTIVITAMIN WITH MINERALS) TABS tablet Take 1 tablet by mouth daily.   11/07/2021  ? ondansetron (ZOFRAN) 4 MG tablet Take 1 tablet (4 mg total) by mouth 2 (two) times daily as needed for nausea or vomiting. 20 tablet 0 Past Month  ? pravastatin (PRAVACHOL) 40 MG tablet Take 1 tablet by mouth once daily (Patient taking differently: Take 40 mg by mouth every evening.) 90 tablet 1 11/07/2021  ? Tiotropium Bromide-Olodaterol (STIOLTO RESPIMAT) 2.5-2.5 MCG/ACT AERS Inhale 2 puffs into the lungs daily. 1 each 11 10/30/2021  ? vitamin B-12 (CYANOCOBALAMIN) 100 MCG tablet Take  100 mcg by mouth daily.   11/17/2021  ? Fluticasone-Umeclidin-Vilant (TRELEGY ELLIPTA) 100-62.5-25 MCG/ACT AEPB Inhale 1 puff into the lungs daily at 6 (six) AM. (Patient not taking: Reported on 10/06/2021) 2 each 0 Not Taking  ? glucose blood (FREESTYLE TEST STRIPS) test strip Check blood sugar no more than twice daily 200 each 12   ? nitroGLYCERIN (NITROSTAT) 0.4 MG SL tablet Place 1 tablet (0.4 mg total) under the tongue every 5 (five) minutes as needed for chest pain (up to 3 doses only.). (Patient not taking: Reported on 10/30/2021) 25 tablet 3 Not Taking  ? ?Scheduled:  ? aspirin EC  81 mg Oral QPM  ? chlorhexidine  15 mL Mouth Rinse BID  ? Chlorhexidine Gluconate Cloth  6 each Topical Daily  ? insulin aspart  0-9 Units Subcutaneous TID WC  ? isosorbide mononitrate  30 mg Oral Daily  ? mouth rinse  15 mL Mouth Rinse q12n4p  ? methylPREDNISolone (SOLU-MEDROL) injection  80 mg Intravenous Q12H  ? Followed by  ? [START ON 11/12/2021] predniSONE  50 mg Oral Daily  ? nitroGLYCERIN  0.5 inch Topical Q8H  ? ?PRN: acetaminophen **OR** acetaminophen, chlorpheniramine-HYDROcodone, guaiFENesin-dextromethorphan, metoprolol tartrate, nitroGLYCERIN, prochlorperazine ? ?Assessment: ?15 yoM w/ PMH CAD s/p PCI '97, DM2, COPD, AAA with Hx distal dissection, admitted for COVID PNA. Noted to have some chest pain and rising troponins, and Pharmacy consulted to dose heparin for ACS. ? ?Baseline INR slightly elevated d/t Eliquis, aPTT not done ?Prior anticoagulation: Eliquis 5 mg PO bid, LD 3/19 at 0921 ? ? ?Today, 11/10/2021: ?Heparin level > 1.1 (falsely elevated due to lingering effects of Eliquis) ?aPTT = 117 sec (supratherapeutic) with heparin gtt at 950 units/hr ?CBC: Hgb wnl; Plt stable WNL ?No complications of therapy noted  ? ? ?Goal of Therapy: ?Heparin level 0.3-0.7 units/ml ?aPTT 66-102 sec ?Monitor platelets  by anticoagulation protocol: Yes ? ?Plan: ?Decrease heparin infusion to 800 units/hr ?Recheck heparin level 8 hrs  after rate change ?Daily CBC, daily heparin level once stable ?Monitor for signs of bleeding or thrombosis ? ?Tawnya Crook, PharmD, BCPS ?Clinical Pharmacist ?11/10/2021 12:07 PM ? ? ? ? ? ?

## 2021-11-10 NOTE — Progress Notes (Signed)
ANTICOAGULATION CONSULT NOTE ? ?Pharmacy Consult for IV heparin ?Indication: chest pain/ACS ? ?No Known Allergies ? ?Patient Measurements: ?Height: '5\' 9"'$  (175.3 cm) ?Weight: 76.2 kg (167 lb 15.9 oz) ?IBW/kg (Calculated) : 70.7 ?Heparin Dosing Weight: TBW ? ?Vital Signs: ?Temp: 97.4 ?F (36.3 ?C) (03/20 1747) ?Temp Source: Axillary (03/20 1747) ?BP: 158/86 (03/20 1500) ?Pulse Rate: 89 (03/20 2037) ? ?Labs: ?Recent Labs  ?  11/19/2021 ?2100 11/14/2021 ?2300 11/09/21 ?0055 11/09/21 ?0253 11/09/21 ?0717 11/09/21 ?1525 11/09/21 ?1809 11/10/21 ?1751 11/10/21 ?0423 11/10/21 ?1053 11/10/21 ?1547 11/10/21 ?2033  ?HGB 14.3  --  12.2*  --   --   --   --  14.5  --   --   --   --   ?HCT 41.5  --  35.2*  --   --   --   --  43.3  --   --   --   --   ?PLT 297  --  243  --   --   --   --  286  --   --   --   --   ?APTT  --   --   --   --   --   --   --  73*  --  117*  --  72*  ?LABPROT 16.1*  --   --   --   --   --   --   --   --   --   --   --   ?INR 1.3*  --   --   --   --   --   --   --   --   --   --   --   ?HEPARINUNFRC  --   --   --   --   --   --   --  >1.10*  --   --   --   --   ?CREATININE 0.72   < >  --    < > 0.77  --   --  0.97  --   --  0.87  --   ?TROPONINIHS 90*   < >  --    < >  --  1,601* 2,823*  --  6,988*  --   --   --   ? < > = values in this interval not displayed.  ? ? ? ?Estimated Creatinine Clearance: 57.6 mL/min (by C-G formula based on SCr of 0.87 mg/dL). ? ?Assessment: ?36 yoM w/ PMH CAD s/p PCI '97, DM2, COPD, AAA with Hx distal dissection, admitted for COVID PNA. Noted to have some chest pain and rising troponins, and Pharmacy consulted to dose heparin for ACS. ? ?Baseline INR slightly elevated d/t Eliquis, aPTT not done ?Prior anticoagulation: Eliquis 5 mg PO bid, LD 3/19 at 0921 ? ? ?Today, 11/10/2021: ?0240 am Heparin level > 1.1 (falsely elevated due to lingering effects of Eliquis) ?2033 aPTT = 72 sec (therapeutic) after heparin gtt decreased to 800 units/hr ?CBC: Hgb wnl; Plt stable WNL ?No complications  of therapy noted  ? ? ?Goal of Therapy: ?Heparin level 0.3-0.7 units/ml ?aPTT 66-102 sec ?Monitor platelets by anticoagulation protocol: Yes ? ?Plan: ?continue heparin infusion @ 800 units/hr ?Daily CBC, aPTT & daily heparin level once stable ?Monitor for signs of bleeding or thrombosis ? ?Eudelia Bunch, Pharm.D ?11/10/2021 9:11 PM ? ?

## 2021-11-10 NOTE — Consult Note (Addendum)
?Cardiology Consultation:  ? ?Patient ID: Phillip Jordan ?MRN: [id number]; DOB: 03-Jun-1930 ? ?Admit date: 11/12/2021 ?Date of Consult: 11/10/2021 ? ?PCP:  Colon Branch, MD ?  ?[facility] Providers ?Cardiologist:  Jenkins Rouge, MD  ?Electrophysiologist:  Cristopher Peru, MD  { ? ?Patient Profile:  ? ?Phillip Jordan is a 88 y.o. male with a hx of CAD s/p DES to proximal LAD and mid LAD in 2014, COPD, paroxysmal atrial flutter, diabetes mellitus, hyperlipidemia, and symptomatic sick sinus syndrome s/p permanent pacemaker who is being seen 11/10/2021 for the evaluation of Elevated troponin/NSTEMI at the request of Dr. Verlon Au. ? ?Monitor February 2022 showed nocturnal bradycardia with sinus pauses up to 4 seconds.  Seen by Dr. [name] and underwent permanent pacemaker implantation. ? ?Last seen by Dr. [name] 01/2021. ? ?History of Present Illness:  ? ?Phillip Jordan had COVID-19 on home test 11/06/2021.  However symptoms worsen and become more dyspneic.  EMS found him hypoxic at 70%.  He had productive cough and low-grade fever.  He required BiPAP.  Admitted for further evaluation and started on broad-spectrum antibiotic and remdesivir.  Felt hypovolemic hypotonic hyponatremia secondary to volume depletion.  Yesterday helped declining mental status with increased work of breathing.  He was placed on BiPAP again.  Troponin trended up from 90 at admission to 6988.  On heparin.  Echocardiogram showed mildly reduced LV function at 40 to 45% (was 50-55% in 07/2020), global hypokinesis, grade 1 diastolic dysfunction.  Cardiology is asked for further recommendation ? ?Patient currently on BiPAP.  History obtained from reviewing chart. ? ?Echo 11/09/21 ? ? 1. Left ventricular ejection fraction, by estimation, is 40 to 45%. The  ?left ventricle has mildly decreased function. The left ventricle  ?demonstrates global hypokinesis. Left ventricular diastolic parameters are  ?consistent with Grade I diastolic  ?dysfunction (impaired  relaxation).  ? 2. Right ventricular systolic function is normal. The right ventricular  ?size is mildly enlarged.  ? 3. The mitral valve is grossly normal. No evidence of mitral valve  ?regurgitation.  ? 4. The aortic valve was not well visualized. Aortic valve regurgitation  ?is not visualized.  ? 5. The inferior vena cava is normal in size with greater than 50%  ?respiratory variability, suggesting right atrial pressure of 3 mmHg.  ? ?Conclusion(s)/Recommendation(s): EF more reduced from prior 08/21/2020.  ? ?Past Medical History:  ?Diagnosis Date  ? Allergic rhinitis   ? Aortic dissection ([place])   ? f/u by cards - AAA with distal dissection.  ? CAD (coronary artery disease)   ? a. s/p MI 1992. b. Prior hx stenting to RCA/Cx. b. 05/2013: [place] s/p DES to distal RCA then staged DES to prox LAD.   ? COPD with asthma ([place])   ? Diabetes mellitus   ? Hyperlipidemia   ? Lichen planus   ? Right midline inferior chest, Dr. [name] Sensing  ? SCC (squamous cell carcinoma)   ? Shingles   ? ? ?Past Surgical History:  ?Procedure Laterality Date  ? [place]  ? CARDIAC CATHETERIZATION    ? left, w/ coronary angiography and left ventriculograpy  ? CARDIOVERSION N/A 09/05/2020  ? Procedure: CARDIOVERSION;  Surgeon: [name], MD;  Location: [facility] ENDOSCOPY;  Service: Cardiovascular;  Laterality: N/A;  ? LEFT HEART CATHETERIZATION WITH CORONARY ANGIOGRAM N/A 06/09/2013  ? Procedure: LEFT HEART CATHETERIZATION WITH CORONARY ANGIOGRAM;  Surgeon: [name], MD;  Location: [facility] CATH LAB;  Service: Cardiovascular;  Laterality: N/A;  ? PACEMAKER  IMPLANT N/A 11/06/2020  ? Procedure: PACEMAKER IMPLANT;  Surgeon: Evans Lance, MD;  Location: [place] CV LAB;  Service: Cardiovascular;  Laterality: N/A;  ? PERCUTANEOUS CORONARY STENT INTERVENTION (PCI-S) N/A 06/12/2013  ? Procedure: PERCUTANEOUS CORONARY STENT INTERVENTION (PCI-S);  Surgeon: [name], MD;  Location: [facility] CATH LAB;  Service: Cardiovascular;   Laterality: N/A;  prox LAD  ? PTCA    ? w/ placement of drugeluting stent in the distal right coronary artery, and distal Cardiologist: [name]  ?  ? ?Inpatient Medications: ?Scheduled Meds: ? aspirin EC  81 mg Oral QPM  ? chlorhexidine  15 mL Mouth Rinse BID  ? Chlorhexidine Gluconate Cloth  6 each Topical Daily  ? insulin aspart  0-9 Units Subcutaneous TID WC  ? isosorbide mononitrate  30 mg Oral Daily  ? mouth rinse  15 mL Mouth Rinse q12n4p  ? methylPREDNISolone (SOLU-MEDROL) injection  80 mg Intravenous Q12H  ? Followed by  ? [START ON 11/12/2021] predniSONE  50 mg Oral Daily  ? nitroGLYCERIN  0.5 inch Topical Q8H  ? ?Continuous Infusions: ? azithromycin Stopped (11/10/21 0043)  ? cefTRIAXone (ROCEPHIN)  IV Stopped (11/10/21 0354)  ? dextrose 5 % and 0.9% NaCl 50 mL/hr at 11/10/21 1219  ? heparin 800 Units/hr (11/10/21 1220)  ? remdesivir 100 mg in NS 100 mL Stopped (11/10/21 1048)  ? ?PRN Meds: ?acetaminophen **OR** acetaminophen, chlorpheniramine-HYDROcodone, guaiFENesin-dextromethorphan, metoprolol tartrate, nitroGLYCERIN, prochlorperazine ? ?Allergies:   No Known Allergies ? ?Social History:   ?Social History  ? ?Socioeconomic History  ? Marital status: Married  ?  Spouse name: Not on file  ? Number of children: 3  ? Years of education: Not on file  ? Highest education level: Not on file  ?Occupational History  ? Occupation: retired  ?Tobacco Use  ? Smoking status: Former  ?  Packs/day: 1.50  ?  Years: 39.00  ?  Pack years: 58.50  ?  Types: Cigarettes  ?  Quit date: 08/24/1992  ?  Years since quitting: 29.2  ? Smokeless tobacco: Never  ?Vaping Use  ? Vaping Use: Never used  ?Substance and Sexual Activity  ? Alcohol use: No  ? Drug use: No  ? Sexual activity: Not on file  ?Other Topics Concern  ? Not on file  ?Social History Narrative  ? Lives w/ wife   ? 3 kids, 11 Gk-GGK  ? ?Social Determinants of Health  ? ?Financial Resource Strain: Low Risk   ? Difficulty of Paying Living Expenses: Not very hard   ?Food Insecurity: No Food Insecurity  ? Worried About Charity fundraiser in the Last Year: Never true  ? Ran Out of Food in the Last Year: Never true  ?Transportation Needs: No Transportation Needs  ? Lack of Transportation (Medical): No  ? Lack of Transportation (Non-Medical): No  ?Physical Activity: Sufficiently Active  ? Days of Exercise per Week: 5 days  ? Minutes of Exercise per Session: 30 min  ?Stress: No Stress Concern Present  ? Feeling of Stress : Not at all  ?Social Connections: Moderately Integrated  ? Frequency of Communication with Friends and Family: Twice a week  ? Frequency of Social Gatherings with Friends and Family: Twice a week  ? Attends Religious Services: More than 4 times per year  ? Active Member of Clubs or Organizations: No  ? Attends Archivist Meetings: Never  ? Marital Status: Married  ?Intimate Partner Violence: Not At Risk  ? Fear of Current  or Ex-Partner: No  ? Emotionally Abused: No  ? Physically Abused: No  ? Sexually Abused: No  ?  ?Family History:   ?Family History  ?Problem Relation Age of Onset  ? Asthma Mother   ? Heart disease Mother   ?     CHF  ? Cancer Mother   ?     melanoma  ? Heart disease Father   ?     MI  ? Prostate cancer Neg Hx   ? Colon cancer Neg Hx   ?  ? ?ROS:  ?Please see the history of present illness.  ?All other ROS reviewed and negative.    ? ?Physical Exam/Data:  ? ?Vitals:  ? 11/10/21 1004 11/10/21 1100 11/10/21 1128 11/10/21 1200  ?BP:  132/65 139/64   ?Pulse:  72 79   ?Resp:  14 17   ?Temp:    (!) 97 ?F (36.1 ?C)  ?TempSrc:    Axillary  ?SpO2: 100% 99% 100%   ?Weight:      ?Height:      ? ? ?Intake/Output Summary (Last 24 hours) at 11/10/2021 1412 ?Last data filed at 11/10/2021 1200 ?Gross per 24 hour  ?Intake 492 ml  ?Output 1140 ml  ?Net -648 ml  ? ?Last 3 Weights 11/11/2021 10/27/2021 10/17/2021  ?Weight (lbs) 167 lb 15.9 oz 168 lb 2 oz 161 lb  ?Weight (kg) 76.2 kg 76.261 kg 73.029 kg  ?   ?Body mass index is 24.81 kg/m?.  ? ?Physical exam  per MD ? ?EKG:  The EKG was personally reviewed and demonstrates:  Sinus rhythm, V paced, non specific ST/T wave changes  ?Telemetry:  Telemetry was personally reviewed and demonstrates: Paced rhythm, PVC ? ?Relevant

## 2021-11-10 NOTE — Progress Notes (Signed)
ANTICOAGULATION CONSULT NOTE ? ?Pharmacy Consult for IV heparin ?Indication: chest pain/ACS ? ?No Known Allergies ? ?Patient Measurements: ?Height: '5\' 9"'$  (175.3 cm) ?Weight: 76.2 kg (167 lb 15.9 oz) ?IBW/kg (Calculated) : 70.7 ?Heparin Dosing Weight: TBW ? ?Vital Signs: ?Temp: 98.8 ?F (37.1 ?C) (03/20 0000) ?Temp Source: Rectal (03/20 0000) ?BP: 148/69 (03/20 0325) ?Pulse Rate: 107 (03/20 0100) ? ?Labs: ?Recent Labs  ?  10/22/2021 ?2100 10/26/2021 ?2300 11/09/21 ?0019 11/09/21 ?0055 11/09/21 ?0253 11/09/21 ?1610 11/09/21 ?9604 11/09/21 ?1525 11/09/21 ?1809 11/10/21 ?5409  ?HGB 14.3  --   --  12.2*  --   --   --   --   --  14.5  ?HCT 41.5  --   --  35.2*  --   --   --   --   --  43.3  ?PLT 297  --   --  243  --   --   --   --   --  286  ?APTT  --   --   --   --   --   --   --   --   --  73*  ?LABPROT 16.1*  --   --   --   --   --   --   --   --   --   ?INR 1.3*  --   --   --   --   --   --   --   --   --   ?HEPARINUNFRC  --   --   --   --   --   --   --   --   --  >1.10*  ?CREATININE 0.72  --  0.98  --   --  0.83 0.77  --   --   --   ?TROPONINIHS 90*   < > 294*  --  290*  --   --  1,601* 2,823*  --   ? < > = values in this interval not displayed.  ? ? ? ?Estimated Creatinine Clearance: 62.6 mL/min (by C-G formula based on SCr of 0.77 mg/dL). ? ? ?Medical History: ?Past Medical History:  ?Diagnosis Date  ? Allergic rhinitis   ? Aortic dissection ([place])   ? f/u by cards - AAA with distal dissection.  ? CAD (coronary artery disease)   ? a. s/p MI 1992. b. Prior hx stenting to RCA/Cx. b. 05/2013: [place] s/p DES to distal RCA then staged DES to prox LAD.   ? COPD with asthma ([place])   ? Diabetes mellitus   ? Hyperlipidemia   ? Lichen planus   ? Right midline inferior chest, Dr. [name] Sensing  ? SCC (squamous cell carcinoma)   ? Shingles   ? ? ?Medications:  ?Medications Prior to Admission  ?Medication Sig Dispense Refill Last Dose  ? albuterol (PROAIR HFA) 108 (90 Base) MCG/ACT inhaler 2 puffs up to every 4 hours as needed only  if  your can't catch your breath 1 each 11 10/23/2021  ? apixaban (ELIQUIS) 5 MG TABS tablet Take 1 tablet (5 mg total) by mouth 2 (two) times daily. 60 tablet 11 11/07/2021 at 1800  ? aspirin EC 81 MG tablet Take 81 mg by mouth every evening. Swallow whole.   11/07/2021 at eve  ? dextromethorphan-guaiFENesin (MUCINEX DM) 30-600 MG 12hr tablet Take 1 tablet by mouth 2 (two) times daily as needed for cough.   10/25/2021  ? fluticasone (FLONASE) 50 MCG/ACT nasal spray Place 2  sprays into both nostrils daily as needed for allergies or rhinitis.   Past Week  ? fluticasone (FLOVENT HFA) 220 MCG/ACT inhaler Inhale 2 puffs into the lungs 2 (two) times daily. 1 each 0 10/26/2021  ? isosorbide mononitrate (IMDUR) 30 MG 24 hr tablet Take 1 tablet by mouth twice daily (Patient taking differently: Take 30 mg by mouth daily.) 180 tablet 1 11/18/2021  ? LAGEVRIO 200 MG CAPS capsule Take 4 capsules by mouth every 12 (twelve) hours.   11/02/2021  ? metFORMIN (GLUCOPHAGE) 500 MG tablet Take 1 tablet by mouth once daily with breakfast (Patient taking differently: Take 500 mg by mouth daily with breakfast.) 90 tablet 1 11/07/2021  ? metoprolol succinate (TOPROL-XL) 25 MG 24 hr tablet Take 0.5 tablets (12.5 mg total) by mouth daily. (Replaces metoprolol TARTRATE) 15 tablet 1 10/29/2021 at 1800  ? Multiple Vitamin (MULTIVITAMIN WITH MINERALS) TABS tablet Take 1 tablet by mouth daily.   11/02/2021  ? ondansetron (ZOFRAN) 4 MG tablet Take 1 tablet (4 mg total) by mouth 2 (two) times daily as needed for nausea or vomiting. 20 tablet 0 Past Month  ? pravastatin (PRAVACHOL) 40 MG tablet Take 1 tablet by mouth once daily (Patient taking differently: Take 40 mg by mouth every evening.) 90 tablet 1 11/07/2021  ? Tiotropium Bromide-Olodaterol (STIOLTO RESPIMAT) 2.5-2.5 MCG/ACT AERS Inhale 2 puffs into the lungs daily. 1 each 11 11/05/2021  ? vitamin B-12 (CYANOCOBALAMIN) 100 MCG tablet Take 100 mcg by mouth daily.   11/07/2021  ? Fluticasone-Umeclidin-Vilant  (TRELEGY ELLIPTA) 100-62.5-25 MCG/ACT AEPB Inhale 1 puff into the lungs daily at 6 (six) AM. (Patient not taking: Reported on 10/06/2021) 2 each 0 Not Taking  ? glucose blood (FREESTYLE TEST STRIPS) test strip Check blood sugar no more than twice daily 200 each 12   ? nitroGLYCERIN (NITROSTAT) 0.4 MG SL tablet Place 1 tablet (0.4 mg total) under the tongue every 5 (five) minutes as needed for chest pain (up to 3 doses only.). (Patient not taking: Reported on 11/12/2021) 25 tablet 3 Not Taking  ? ?Scheduled:  ? aspirin EC  81 mg Oral QPM  ? aspirin  325 mg Oral Once  ? chlorhexidine  15 mL Mouth Rinse BID  ? Chlorhexidine Gluconate Cloth  6 each Topical Daily  ? insulin aspart  0-9 Units Subcutaneous TID WC  ? isosorbide mononitrate  30 mg Oral Daily  ? mouth rinse  15 mL Mouth Rinse q12n4p  ? methylPREDNISolone (SOLU-MEDROL) injection  80 mg Intravenous Q12H  ? Followed by  ? [START ON 11/12/2021] predniSONE  50 mg Oral Daily  ? nitroGLYCERIN  0.5 inch Topical Q8H  ? ?PRN: acetaminophen **OR** acetaminophen, chlorpheniramine-HYDROcodone, guaiFENesin-dextromethorphan, metoprolol tartrate, nitroGLYCERIN, prochlorperazine ? ?Assessment: ?4 yoM w/ PMH CAD s/p PCI '97, DM2, COPD, AAA with Hx distal dissection, admitted for COVID PNA. Noted to have some chest pain and rising troponins today, and Pharmacy consulted to dose heparin for ACS. ? ?Baseline INR slightly elevated d/t Eliquis, aPTT not done ?Prior anticoagulation: Eliquis 5 mg PO bid, LD 3/19 at 0921 ? ? ? ?Today, 11/10/2021: ?Heparin level > 1.1 (falsely elevated due to lingering effects of Eliquis) ?aPTT = 73 sec (therapeutic) with heparin gtt @ 950 units/hr ?CBC: Hgb wnl; Plt stable WNL ?No complications of therapy noted  ? ? ?Goal of Therapy: ?Heparin level 0.3-0.7 units/ml ?aPTT 66-102 sec ?Monitor platelets by anticoagulation protocol: Yes ? ?Plan: ?Continue Heparin 950 units/hr IV infusion ?Recheck heparin level in 8 hrs to confirm therapeutic  dose ?Daily CBC,  daily heparin level once stable ?Monitor for signs of bleeding or thrombosis ? ?Leone Haven, PharmD ?11/10/2021, 3:48 AM ? ? ? ? ?

## 2021-11-10 NOTE — Progress Notes (Signed)
?PROGRESS NOTE ? ? ?Phillip Jordan  NID:[id number] DOB: [birth year] DOA: 11/04/2021 ?PCP: Colon Branch, MD  ?Brief Narrative:  ? ?88 year old [place] veteran white male ?CAD stenting initially 1997 = DES RCA DES LAD 05/2013-intolerant to beta-blockade secondary to bradycardia-underwent PPM placement 10/2020 [employer] ?AAA with distal dissection previously ?DM TY 2 ?Moderate COPD Gold stage III ?Moderate sensorineural hearing loss followed by [facility] ENT ? ?Patient was diagnosed by home test for COVID 11/06/2021-his wife also tested positive-it appears his wife has some underlying cognitive deficit-they live together independently at home ?He has 3 children ? ?He tells me he started feeling poorly around Friday-no diarrhea no chills no rigors no other issues he has no chest pain he has no vision loss unilateral weakness ?-He has had anorexia however and has not really eaten for the past several days-he is not on any thiazide diuretics and has had no new change in medications ? ?On initial evaluation by EMS his sats were in the upper 70s he had a low-grade fever at home-he was treated with 1 g Tylenol 5 albuterol Atrovent 2 of mag and 125 Solu-Medrol and an IV was placed ?Cefepime vancomycin were started on admission-BiPAP was also initiated in the ED-blood pressure was elevated-conservative fluid strategy was pursued ? ?Sodium was 118-he was mentating well however there for consideration for 3% although given was not initiated ?White count was 25 hemoglobin 12.2 lactic acid 3.3 ?COVID-specific labs not obtained ? ?Hospital-Problem based course ? ?COVID-positive with acute respiratory failure ?Started on standard usually therapies remdesivir/Solu-Medrol 80 every 12 for 3 days and then prednisone 50 daily ?CRP is markedly elevated and after discussion of risks benefits and alternatives in addition to conference with several colleagues  ?we will give a dose of Actemra-I have explained in detail the exclusion criteria to  family and they wish to proceed ?Patient is BiPAP dependent at this juncture so he is n.p.o. ?Would continue concurrent antibiotics at the same time--vancomycin and cefepime can continue at this time ?NSTEMI in setting of COVID worsening respiratory distress ?Prior CAD stenting 2014 ?PPM Dr. [name] paced rhythm ?Appreciate cardiology input-troponin peaked at 6000 echo 40% ?Continue GDMT heparin aspirin Imdur at this time ?No current plans for catheterization or anatomy delineation ?Severe hyponatremia 2/2 volume depletion hypovolemia ?Sodium is improved significantly--however we did slowly replace it ?I will change D5 LR as patient is not eating ?We will repeat labs in a.m. ?His volume depletion is actually worsening some since admission so we will increase the rate of fluid to 100 cc/H ? ? ?DVT prophylaxis: Patient on IV heparin ?Code Status: Patient is DNR after extensive conversations ?Family Communication: Long discussions with multiple daughters son and wife at the bedside several times today-they understand the nature of use of Actemra and the risk-benefit analysis and have decided to proceed-we will continue to monitor closely on stepdown unit-there is to be no escalation of care beyond BiPAP ?Disposition:  ?Status is: Inpatient ?Remains inpatient appropriate because: Hemodynamically unstable ?  ?Consultants:  ?None ? ?Procedures: No ? ?Antimicrobials: Vancomycin cefepime ? ? ?Subjective: ?Patient was restless overnight did not sleep well and is somewhat somnolent today ?He tells me he has no chest pain whatsoever at this time ?He has no fever or chills ?He has no arm pain ?He remains on BiPAP at 12/6 ? ?Objective: ?Vitals:  ? 11/10/21 1200 11/10/21 1300 11/10/21 1400 11/10/21 1500  ?BP: (!) 148/65 (!) 142/75 (!) 143/63 (!) 158/86  ?Pulse: 80 79 76 88  ?Resp:  $'17 14 14 17  'h$ ?Temp: (!) 97 ?F (36.1 ?C)     ?TempSrc: Axillary     ?SpO2: 100% 99% 99% 100%  ?Weight:      ?Height:      ? ? ?Intake/Output Summary  (Last 24 hours) at 11/10/2021 1642 ?Last data filed at 11/10/2021 1500 ?Gross per 24 hour  ?Intake 629.31 ml  ?Output 1290 ml  ?Net -660.69 ml  ? ?Filed Weights  ? 11/16/2021 2059  ?Weight: 76.2 kg  ? ? ?Examination: ? ?Frail white male no distress ?No icterus no pallor ?Chest clear anteriorly but poor exam because of BiPAP ?Abdomen soft no rebound ?No lower extremity edema ?ROM intact ?Power 5/5 ? ?Data Reviewed: personally reviewed  ? ?CBC ?   ?Component Value Date/Time  ? WBC 23.1 (H) 11/10/2021 0249  ? RBC 4.75 11/10/2021 0249  ? HGB 14.5 11/10/2021 0249  ? HGB 15.2 10/11/2020 0953  ? HCT 43.3 11/10/2021 0249  ? HCT 44.1 10/11/2020 0953  ? PLT 286 11/10/2021 0249  ? PLT 282 10/11/2020 0953  ? MCV 91.2 11/10/2021 0249  ? MCV 89 10/11/2020 0953  ? MCH 30.5 11/10/2021 0249  ? MCHC 33.5 11/10/2021 0249  ? RDW 13.2 11/10/2021 0249  ? RDW 11.7 10/11/2020 0953  ? LYMPHSABS 0.4 (L) 11/10/2021 0249  ? LYMPHSABS 1.6 10/11/2020 0953  ? MONOABS 1.4 (H) 11/10/2021 0249  ? EOSABS 0.0 11/10/2021 0249  ? EOSABS 0.3 10/11/2020 0953  ? BASOSABS 0.0 11/10/2021 0249  ? BASOSABS 0.1 10/11/2020 0953  ? ?CMP Latest Ref Rng & Units 11/10/2021 11/09/2021 11/09/2021  ?Glucose 70 - 99 mg/dL 185(H) 239(H) 255(H)  ?BUN 8 - 23 mg/dL '23 13 14  '$ ?Creatinine 0.61 - 1.24 mg/dL 0.97 0.77 0.83  ?Sodium 135 - 145 mmol/L 127(L) 125(L) 122(L)  ?Potassium 3.5 - 5.1 mmol/L 5.3(H) 4.0 4.1  ?Chloride 98 - 111 mmol/L 94(L) 90(L) 91(L)  ?CO2 22 - 32 mmol/L '23 22 22  '$ ?Calcium 8.9 - 10.3 mg/dL 8.9 8.6(L) 8.4(L)  ?Total Protein 6.5 - 8.1 g/dL 7.1 - -  ?Total Bilirubin 0.3 - 1.2 mg/dL 0.3 - -  ?Alkaline Phos 38 - 126 U/L 75 - -  ?AST 15 - 41 U/L 73(H) - -  ?ALT 0 - 44 U/L 31 - -  ? ? ? ?Radiology Studies: ?DG Chest Port 1 View ? ?Result Date: 11/03/2021 ?CLINICAL DATA:  Shortness of breath and dyspnea. EXAM: PORTABLE CHEST 1 VIEW COMPARISON:  Chest x-ray 12/27/2020 FINDINGS: Left-sided pacemaker is again seen. There are increasing patchy airspace opacities in the left  lung base. There is no pleural effusion or pneumothorax. The cardiomediastinal silhouette is within normal limits. No acute fractures. IMPRESSION: 1. Increasing left lower lobe airspace disease worrisome for pneumonia. Follow-up chest x-ray recommended in 4 6 weeks to confirm resolution. Electronically Signed   By: Ronney Asters M.D.   On: 10/28/2021 21:34  ? ?ECHOCARDIOGRAM COMPLETE ? ?Result Date: 11/09/2021 ?   ECHOCARDIOGRAM REPORT   Patient Name:   Phillip Jordan Date of Exam: 11/09/2021 Medical Rec #:  [id number]        Height:       69.0 in Accession #:    [id number]       Weight:       168.0 lb Date of Birth:  08-12-1930        BSA:          1.918 m? Patient Age:    25 years  BP:           125/55 mmHg Patient Gender: M                HR:           111 bpm. Exam Location:  Inpatient Procedure: 2D Echo, Cardiac Doppler and Color Doppler Indications:    I33.9 SBE  History:        Patient has prior history of Echocardiogram examinations, most                 recent 08/21/2020. CAD, COPD; Risk Factors:Diabetes and                 Dyslipidemia.  Sonographer:    Beryle Beams Referring Phys: [id number]  Sonographer Comments: Image acquisition challenging due to respiratory motion. IMPRESSIONS  1. Left ventricular ejection fraction, by estimation, is 40 to 45%. The left ventricle has mildly decreased function. The left ventricle demonstrates global hypokinesis. Left ventricular diastolic parameters are consistent with Grade I diastolic dysfunction (impaired relaxation).  2. Right ventricular systolic function is normal. The right ventricular size is mildly enlarged.  3. The mitral valve is grossly normal. No evidence of mitral valve regurgitation.  4. The aortic valve was not well visualized. Aortic valve regurgitation is not visualized.  5. The inferior vena cava is normal in size with greater than 50% respiratory variability, suggesting right atrial pressure of 3 mmHg.  Conclusion(s)/Recommendation(s): EF more reduced from prior 08/21/2020. FINDINGS  Left Ventricle: Left ventricular ejection fraction, by estimation, is 40 to 45%. The left ventricle has mildly decreased function. The left ventricle de

## 2021-11-11 ENCOUNTER — Inpatient Hospital Stay (HOSPITAL_COMMUNITY): Payer: HMO

## 2021-11-11 DIAGNOSIS — J9601 Acute respiratory failure with hypoxia: Secondary | ICD-10-CM | POA: Diagnosis not present

## 2021-11-11 LAB — GLUCOSE, CAPILLARY
Glucose-Capillary: 313 mg/dL — ABNORMAL HIGH (ref 70–99)
Glucose-Capillary: 242 mg/dL — ABNORMAL HIGH (ref 70–99)
Glucose-Capillary: 258 mg/dL — ABNORMAL HIGH (ref 70–99)
Glucose-Capillary: 208 mg/dL — ABNORMAL HIGH (ref 70–99)

## 2021-11-11 LAB — COMPREHENSIVE METABOLIC PANEL
ALT: 31 U/L (ref 0–44)
AST: 51 U/L — ABNORMAL HIGH (ref 15–41)
Albumin: 3.1 g/dL — ABNORMAL LOW (ref 3.5–5.0)
Alkaline Phosphatase: 62 U/L (ref 38–126)
Anion gap: 9 (ref 5–15)
BUN: 37 mg/dL — ABNORMAL HIGH (ref 8–23)
CO2: 23 mmol/L (ref 22–32)
Calcium: 8.4 mg/dL — ABNORMAL LOW (ref 8.9–10.3)
Chloride: 97 mmol/L — ABNORMAL LOW (ref 98–111)
Creatinine, Ser: 0.87 mg/dL (ref 0.61–1.24)
GFR, Estimated: >60 mL/min (ref >60)
Glucose, Bld: 267 mg/dL — ABNORMAL HIGH (ref 70–99)
Potassium: 4.1 mmol/L (ref 3.5–5.1)
Sodium: 129 mmol/L — ABNORMAL LOW (ref 135–145)
Total Bilirubin: 0.2 mg/dL — ABNORMAL LOW (ref 0.3–1.2)
Total Protein: 6.1 g/dL — ABNORMAL LOW (ref 6.5–8.1)

## 2021-11-11 LAB — CBC WITH DIFFERENTIAL/PLATELET
Abs Immature Granulocytes: 0.12 10*3/uL — ABNORMAL HIGH (ref 0.00–0.07)
Basophils Absolute: 0 10*3/uL (ref 0.0–0.1)
Basophils Relative: 0 %
Eosinophils Absolute: 0 10*3/uL (ref 0.0–0.5)
Eosinophils Relative: 0 %
HCT: 38.8 % — ABNORMAL LOW (ref 39.0–52.0)
Hemoglobin: 13.4 g/dL (ref 13.0–17.0)
Immature Granulocytes: 1 %
Lymphocytes Relative: 2 %
Lymphs Abs: 0.5 10*3/uL — ABNORMAL LOW (ref 0.7–4.0)
MCH: 30.9 pg (ref 26.0–34.0)
MCHC: 34.5 g/dL (ref 30.0–36.0)
MCV: 89.4 fL (ref 80.0–100.0)
Monocytes Absolute: 1.1 10*3/uL — ABNORMAL HIGH (ref 0.1–1.0)
Monocytes Relative: 5 %
Neutro Abs: 21.1 10*3/uL — ABNORMAL HIGH (ref 1.7–7.7)
Neutrophils Relative %: 92 %
Platelets: 302 10*3/uL (ref 150–400)
RBC: 4.34 MIL/uL (ref 4.22–5.81)
RDW: 13.3 % (ref 11.5–15.5)
WBC: 22.8 10*3/uL — ABNORMAL HIGH (ref 4.0–10.5)
nRBC: 0 % (ref 0.0–0.2)

## 2021-11-11 LAB — APTT
aPTT: 49 seconds — ABNORMAL HIGH (ref 24–36)
aPTT: 54 seconds — ABNORMAL HIGH (ref 24–36)

## 2021-11-11 LAB — TROPONIN I (HIGH SENSITIVITY)
Troponin I (High Sensitivity): 2024 ng/L (ref <18)
Troponin I (High Sensitivity): 2095 ng/L (ref <18)

## 2021-11-11 LAB — HEPARIN LEVEL (UNFRACTIONATED): Heparin Unfractionated: >1.1 IU/mL — ABNORMAL HIGH (ref 0.30–0.70)

## 2021-11-11 LAB — C-REACTIVE PROTEIN: CRP: 11.3 mg/dL — ABNORMAL HIGH (ref <1.0)

## 2021-11-11 LAB — D-DIMER, QUANTITATIVE: D-Dimer, Quant: 0.96 ug/mL-FEU — ABNORMAL HIGH (ref 0.00–0.50)

## 2021-11-11 MED ORDER — METOPROLOL TARTRATE 12.5 MG HALF TABLET: 12.5000 mg | ORAL_TABLET | Freq: Two times a day (BID) | ORAL | Status: DC

## 2021-11-11 MED ORDER — ARFORMOTEROL TARTRATE 15 MCG/2ML IN NEBU
15.0000 ug | INHALATION_SOLUTION | Freq: Two times a day (BID) | RESPIRATORY_TRACT | Status: DC
  Administered 2021-11-11 – 2021-11-18 (×14): 15 ug via RESPIRATORY_TRACT
  Filled 2021-11-11 (×17): qty 2

## 2021-11-11 MED ORDER — LEVALBUTEROL HCL 0.63 MG/3ML IN NEBU
0.6300 mg | INHALATION_SOLUTION | Freq: Four times a day (QID) | RESPIRATORY_TRACT | Status: DC
  Administered 2021-11-11 – 2021-11-12 (×7): 0.63 mg via RESPIRATORY_TRACT
  Filled 2021-11-11 (×7): qty 3

## 2021-11-11 MED ORDER — METOPROLOL TARTRATE 25 MG PO TABS
12.5000 mg | ORAL_TABLET | Freq: Two times a day (BID) | ORAL | Status: DC
  Administered 2021-11-11 – 2021-11-20 (×20): 12.5 mg via ORAL
  Filled 2021-11-11 (×20): qty 1

## 2021-11-11 MED ORDER — REVEFENACIN 175 MCG/3ML IN SOLN
175.0000 ug | Freq: Every day | RESPIRATORY_TRACT | Status: DC
  Administered 2021-11-12: 175 ug via RESPIRATORY_TRACT
  Filled 2021-11-11 (×3): qty 3

## 2021-11-11 MED ORDER — MORPHINE SULFATE (PF) 2 MG/ML IV SOLN
1.0000 mg | Freq: Once | INTRAVENOUS | Status: AC
  Administered 2021-11-11: 1 mg via INTRAVENOUS
  Filled 2021-11-11 (×2): qty 1

## 2021-11-11 MED ORDER — INSULIN DETEMIR 100 UNIT/ML ~~LOC~~ SOLN
10.0000 [IU] | Freq: Every day | SUBCUTANEOUS | Status: DC
  Administered 2021-11-11 – 2021-11-19 (×9): 10 [IU] via SUBCUTANEOUS
  Filled 2021-11-11 (×10): qty 0.1

## 2021-11-11 NOTE — Progress Notes (Addendum)
?PROGRESS NOTE ? ? ?Phillip Jordan  XIP:[id number] DOB: Jun 16, 1930 DOA: 11/15/2021 ?PCP: Colon Branch, MD  ?Brief Narrative:  ? ?88 year old [place] veteran white male ?CAD stenting initially 1997 = DES RCA DES LAD 05/2013-intolerant to beta-blockade secondary to bradycardia-underwent PPM placement 10/2020 [employer] ?AAA with distal dissection previously ?DM TY 2 ?Moderate COPD Gold stage III ?Moderate sensorineural hearing loss followed by [facility] ENT ? ?Patient was diagnosed by home test for COVID 11/06/2021-his wife also tested positive--they live together independently at home ?He has 3 children ?He was started on Paxlovid empirically when diagnosed c Covid ? ?He tells me he started feeling poorly around Friday-no diarrhea no chills no rigors no other issues he has no chest pain he has no vision loss unilateral weakness ?-He has had anorexia however and has not really eaten for the past several days-he is not on any thiazide diuretics and has had no new change in medications ? ?On initial evaluation by EMS his sats were in the upper 70s he had a low-grade fever at home-he was treated with 1 g Tylenol 5 albuterol Atrovent 2 of mag and 125 Solu-Medrol and an IV was placed ?Cefepime vancomycin were started on admission-BiPAP was also initiated in the ED-blood pressure was elevated-conservative fluid strategy was pursued ? ?Sodium was 118-he was mentating well however there for consideration for 3% although given was not initiated ?White count was 25 hemoglobin 12.2 lactic acid 3.3 ?COVID-specific labs not obtained ? ?Hospital-Problem based course ? ?COVID-positive with acute respiratory failure ?Started on standard usually therapies remdesivir/Solu-Medrol 80 every 12 for 3 days and then prednisone 50 daily ?CRP markedly elevated-Actemra X1 3/20-CRP now 11 ?And hope for recovery BiPAP dependent-work of breathing is significant off of the BiPAP ?His x-ray this morning shows worsening infiltrates-continuing supportive  management ?NSTEMI in setting of COVID worsening respiratory distress ?Prior CAD stenting 2014 ?PPM Dr. [name] paced rhythm ?Chest pain middle of the night portends poorly for the patient --was given morphine at 6 AM and metoprolol IV at 3 AM which seemed to help ?We will give metoprolol 12.5 twice daily with a sip of water and then place him back on BiPAP ?EKG my over read this morning rate related changes ST-T wave segment elevations across precordial leads-he is a poor cath candidate--- troponin being repeated ?Continue heparin, nitro, aspirin as able--- give oral beta-blocker as able ?Further plans per cardiology although he is a poor catheterization candidate ?I had a discussion with the daughter at the bedside this morning-hopeful for curative treatment however we may need to discuss palliation if he does not improve in the next 48 hours ?Severe hyponatremia 2/2 volume depletion hypovolemia ?Sodium is improved 129 ?Patient is mentating well ?His volume depletion continues to worsen slightly ?Moderate gold COPD stage III ?Supportive management-add Xopenex as able ?Diabetes mellitus type 2 with worsening hyperglycemia secondary to IV steroids ?Add Levemir 10 units to sliding scale coverage-he is n.p.o.-monitor ? ? ?DVT prophylaxis: Patient on IV heparin ?Code Status: Patient is DNR after extensive conversations ?Family Communication: L ?Disposition:  ?Status is: Inpatient ?Remains inpatient appropriate because: Hemodynamically unstable ?  ?Consultants:  ?None ? ?Procedures: No ? ?Antimicrobials: Vancomycin cefepime ? ? ?Subjective: ? ?Still on BiPAP-chest pain in the middle night treated with metoprolol and then with morphine--his work of breathing is about 25 times per minute off the BiPAP his heart rate goes up to the 120s 130s as a compensation ?I think he will need to be on BiPAP for the rest of  the day at least ?He has no overt chest pain but feels some "discomfort" as result we will give metoprolol p.o.  and continue the IV therapy ? ?Objective: ?Vitals:  ? 11/11/21 0400 11/11/21 0437 11/11/21 0500 11/11/21 0600  ?BP: (!) 175/95 (!) 159/134 (!) 163/96 (!) 177/100  ?Pulse: 87 89 87 (!) 105  ?Resp: (!) '24 20 18 '$ (!) 21  ?Temp: 97.6 ?F (36.4 ?C)     ?TempSrc: Axillary     ?SpO2: 99% 99% 99% 99%  ?Weight:      ?Height:      ? ? ?Intake/Output Summary (Last 24 hours) at 11/11/2021 0743 ?Last data filed at 11/11/2021 0600 ?Gross per 24 hour  ?Intake 811.03 ml  ?Output 960 ml  ?Net -148.97 ml  ? ? ?Filed Weights  ? 10/30/2021 2059  ?Weight: 76.2 kg  ? ? ?Examination: ? ?Frail white very hard of hearing no distress on BiPAP however once taken off of BiPAP he starts to have accessory muscle use and work harder ?No icterus no pallor ?Chest clear anteriorly poor exam posterolaterally but seems overall clear ?Abdomen soft no rebound ?No lower extremity edema ?ROM intact ?Power 5/5 ? ?Data Reviewed: personally reviewed  ? ?CBC ?   ?Component Value Date/Time  ? WBC 22.8 (H) 11/11/2021 0245  ? RBC 4.34 11/11/2021 0245  ? HGB 13.4 11/11/2021 0245  ? HGB 15.2 10/11/2020 0953  ? HCT 38.8 (L) 11/11/2021 0245  ? HCT 44.1 10/11/2020 0953  ? PLT 302 11/11/2021 0245  ? PLT 282 10/11/2020 0953  ? MCV 89.4 11/11/2021 0245  ? MCV 89 10/11/2020 0953  ? MCH 30.9 11/11/2021 0245  ? MCHC 34.5 11/11/2021 0245  ? RDW 13.3 11/11/2021 0245  ? RDW 11.7 10/11/2020 0953  ? LYMPHSABS 0.5 (L) 11/11/2021 0245  ? LYMPHSABS 1.6 10/11/2020 0953  ? MONOABS 1.1 (H) 11/11/2021 0245  ? EOSABS 0.0 11/11/2021 0245  ? EOSABS 0.3 10/11/2020 0953  ? BASOSABS 0.0 11/11/2021 0245  ? BASOSABS 0.1 10/11/2020 0953  ? ?CMP Latest Ref Rng & Units 11/11/2021 11/10/2021 11/10/2021  ?Glucose 70 - 99 mg/dL 267(H) 205(H) 185(H)  ?BUN 8 - 23 mg/dL 37(H) 33(H) 23  ?Creatinine 0.61 - 1.24 mg/dL 0.87 0.87 0.97  ?Sodium 135 - 145 mmol/L 129(L) 131(L) 127(L)  ?Potassium 3.5 - 5.1 mmol/L 4.1 4.3 5.3(H)  ?Chloride 98 - 111 mmol/L 97(L) 97(L) 94(L)  ?CO2 22 - 32 mmol/L '23 26 23  '$ ?Calcium 8.9 -  10.3 mg/dL 8.4(L) 8.7(L) 8.9  ?Total Protein 6.5 - 8.1 g/dL 6.1(L) - 7.1  ?Total Bilirubin 0.3 - 1.2 mg/dL 0.2(L) - 0.3  ?Alkaline Phos 38 - 126 U/L 62 - 75  ?AST 15 - 41 U/L 51(H) - 73(H)  ?ALT 0 - 44 U/L 31 - 31  ? ? ? ?Radiology Studies: ?DG CHEST PORT 1 VIEW ? ?Result Date: 11/11/2021 ?CLINICAL DATA:  88 year old male with increasing shortness of breath. EXAM: PORTABLE CHEST 1 VIEW COMPARISON:  Chest x-ray 11/03/2021. FINDINGS: Left-sided pacemaker device in place with lead tips projecting over the expected location of the right atrium and right ventricle. Lung volumes are normal. Patchy areas of interstitial prominence an ill-defined airspace consolidation are noted throughout the mid to lower lungs bilaterally, worsened compared to the recent prior study. Small bilateral pleural effusions. No pneumothorax. No evidence of pulmonary edema. Heart size is normal. Upper mediastinal contours are within normal limits. Atherosclerotic calcifications are noted thoracic aorta. IMPRESSION: 1. Worsening aeration throughout the mid to lower lungs  bilaterally, suggestive of multilobar bilateral pneumonia, with small bilateral pleural effusions, as above. 2. Aortic atherosclerosis. Electronically Signed   By: Vinnie Langton M.D.   On: 11/11/2021 05:12  ? ?ECHOCARDIOGRAM COMPLETE ? ?Result Date: 11/09/2021 ?   ECHOCARDIOGRAM REPORT   Patient Name:   Phillip Jordan Date of Exam: 11/09/2021 Medical Rec #:  [id number]        Height:       69.0 in Accession #:    [id number]       Weight:       168.0 lb Date of Birth:  Jan 20, 1930        BSA:          1.918 m? Patient Age:    82 years         BP:           125/55 mmHg Patient Gender: M                HR:           111 bpm. Exam Location:  Inpatient Procedure: 2D Echo, Cardiac Doppler and Color Doppler Indications:    I33.9 SBE  History:        Patient has prior history of Echocardiogram examinations, most                 recent 08/21/2020. CAD, COPD; Risk Factors:Diabetes and                  Dyslipidemia.  Sonographer:    Beryle Beams Referring Phys: [id number]  Sonographer Comments: Image acquisition challenging due to respiratory motion. IMPRESSIONS  1. Left ventricul

## 2021-11-11 NOTE — Progress Notes (Signed)
Pharmacy: Re- heparin ? ?Patient is an 88 y.o M with hx CAD and Afib on Eliquis PTA, presented to the ED on 10/26/2021 with c/o SOB and fever.  He was recently diagnosed with COVID on 11/06/21 and on Molnipiravir PTA. Eliquis resumed on admission and changed to heparin drip on 11/09/21 after troponin was found to be elevated. He's currently on heparin drip for NSTEMI. ? ?- last dose of Eliquis given to patient on 11/09/21 at 0921 ? ?- aPTT level collected at 6:52p is sub-therapeutic at 54 secs despite rate increased to 900 units/hr earlier today ?- per pt's RN, no issues with IV line and no bleeding noted ? ?Goal of Therapy: ?Heparin level 0.3-0.7 units/ml ?aPTT 66-102 sec ?Monitor platelets by anticoagulation protocol: Yes ? ?Plan:  ?- Increase heparin drip to 1100 units/hr ?- check 8 hr aPTT and heparin level ?- monitor for s/sx bleeding ? ?Phillip Jordan, PharmD, BCPS ?11/11/2021 8:19 PM ? ?

## 2021-11-11 NOTE — Consult Note (Signed)
NAME:  Phillip Jordan, MRN:  [id number], DOB:  04/09/1930, LOS: 3 ADMISSION DATE:  11/30/2021, CONSULTATION DATE:  11/11/2021 REFERRING MD: Dr. Mahala Menghini, CHIEF COMPLAINT: Acute hypoxemic respiratory failure, COVID pneumonia  History of Present Illness:  88 year old gentleman who presented with shortness of breath. Found to have COVID for which she has been treated at present Increase in markers of inflammation with increasing shortness of breath with chest discomfort, does have underlying coronary artery disease Background history of coronary artery disease status post stenting, diabetes type 2, stage III COPD, permanent pacemaker placement for tachybradycardia syndrome, atrial flutter. Was having shortness of breath with a cough, home COVID testing report was positive-was prescribed molnupiravir as outpatient.  Worsening shortness of breath led to presentation to the hospital  Started on empiric antibiotics, COVID treatment protocol  Pertinent  Medical History   Past Medical History:  Diagnosis Date   Allergic rhinitis    Aortic dissection (HCC)    f/u by cards - AAA with distal dissection.   CAD (coronary artery disease)    a. s/p MI 1992. b. Prior hx stenting to RCA/Cx. b. 05/2013: [place] s/p DES to distal RCA then staged DES to prox LAD.    COPD with asthma (HCC)    Diabetes mellitus    Hyperlipidemia    Lichen planus    Right midline inferior chest, Dr. Arminda Resides   SCC (squamous cell carcinoma)    Shingles    Significant Hospital Events: Including procedures, antibiotic start and stop dates in addition to other pertinent events   Chest x-ray 11/11/2021-multifocal infiltrate Echocardiogram 11/09/2021 with ejection fraction of 40 to 45%, diastolic dysfunction  Interim History / Subjective:  Patient did have some discomfort with his breathing and was placed back on BiPAP No other overnight events Not having any chest discomfort at present but did have some chest discomfort  earlier  Objective   Blood pressure (!) 143/75, pulse 87, temperature 97.6 F (36.4 C), temperature source Axillary, resp. rate (!) 21, height 5\' 9"  (1.753 m), weight 76.2 kg, SpO2 99 %.    FiO2 (%):  [30 %-40 %] 30 %   Intake/Output Summary (Last 24 hours) at 11/11/2021 1021 Last data filed at 11/11/2021 1000 Gross per 24 hour  Intake 2003.42 ml  Output 1060 ml  Net 943.42 ml   Filed Weights   11-30-21 2059  Weight: 76.2 kg    Examination: General: Elderly, does appear comfortable on BiPAP HENT: BiPAP mask in place Lungs: Decreased air movement bilaterally Cardiovascular: S1-S2 appreciated Abdomen: Soft, bowel sounds appreciated Extremities: No clubbing, no edema Neuro: Alert and oriented x3, very hard of hearing GU:   Labs reviewed Markers of inflammation noted, improvement in CRP  Resolved Hospital Problem list     Assessment & Plan:  COVID-pneumonia -On remdesivir, steroids, received Actemra -Inflammation markers appears to be trending better  Non-ST elevation MI -COVID driven stress to known underlying coronary artery disease -Medical management is probably most optimal in the current situation -Cardiology on board -He does have underlying coronary artery disease for which she had stenting done in 2014 -On heparin, aspirin, Imdur  Diastolic heart failure -Recent echocardiogram did show slight decrease in his ejection fraction, he did have global hypokinesis on the echocardiogram  Paroxysmal atrial flutter -Paced rhythm -Was on Eliquis as outpatient  Acute exacerbation of chronic obstructive pulmonary disease Gold stage III -Pulmonary function test from 2017 shows FEV1 of 37% -COVID driven exacerbation of COPD -Continue bronchodilators  Hypoxemic respiratory failure -  Continue BiPAP as needed  Hyponatremia -being addressed -improving  Unfortunately will likely remain significantly symptomatic ,  looking at his previous PFT- FEV1 in 2017 was 37%.   COVID driven demand from his breathing with his poor baseline FEV1-will likely be associated shortness of breath he is manifesting rate at present COVID precipitated demand on his heart-non-ST elevation MI  Did receive Actemra, CRP improving Continue current measures  Best Practice (right click and "Reselect all SmartList Selections" daily)   Per primary  Labs   CBC: Recent Labs  Lab 12-05-21 2100 11/09/21 0055 11/10/21 0249 11/11/21 0245  WBC 25.3* 25.2* 23.1* 22.8*  NEUTROABS 22.5* 24.4* 21.2* 21.1*  HGB 14.3 12.2* 14.5 13.4  HCT 41.5 35.2* 43.3 38.8*  MCV 87.7 88.2 91.2 89.4  PLT 297 243 286 302    Basic Metabolic Panel: Recent Labs  Lab 11/09/21 0516 11/09/21 0717 11/10/21 0249 11/10/21 1547 11/11/21 0245  NA 122* 125* 127* 131* 129*  K 4.1 4.0 5.3* 4.3 4.1  CL 91* 90* 94* 97* 97*  CO2 22 22 23 26 23   GLUCOSE 255* 239* 185* 205* 267*  BUN 14 13 23  33* 37*  CREATININE 0.83 0.77 0.97 0.87 0.87  CALCIUM 8.4* 8.6* 8.9 8.7* 8.4*   GFR: Estimated Creatinine Clearance: 57.6 mL/min (by C-G formula based on SCr of 0.87 mg/dL). Recent Labs  Lab 12-05-21 2100 Dec 05, 2021 2306 11/09/21 0055 11/09/21 0717 11/10/21 0249 11/11/21 0245  PROCALCITON  --   --   --  6.73  --   --   WBC 25.3*  --  25.2*  --  23.1* 22.8*  LATICACIDVEN 2.6* 3.3*  --   --   --   --     Liver Function Tests: Recent Labs  Lab 05-Dec-2021 2100 11/10/21 0249 11/11/21 0245  AST 29 73* 51*  ALT 17 31 31   ALKPHOS 78 75 62  BILITOT 0.6 0.3 0.2*  PROT 7.2 7.1 6.1*  ALBUMIN 3.9 3.5 3.1*   No results for input(s): LIPASE, AMYLASE in the last 168 hours. No results for input(s): AMMONIA in the last 168 hours.  ABG    Component Value Date/Time   PHART 7.39 11/09/2021 1617   PCO2ART 44 11/09/2021 1617   PO2ART 145 (H) 11/09/2021 1617   HCO3 26.6 11/09/2021 1617   TCO2 22.8 11/01/2008 0010   ACIDBASEDEF 1.3 12/05/2021 2140   O2SAT 100 11/09/2021 1617     Coagulation Profile: Recent Labs   Lab Dec 05, 2021 2100  INR 1.3*    Cardiac Enzymes: No results for input(s): CKTOTAL, CKMB, CKMBINDEX, TROPONINI in the last 168 hours.  HbA1C: Hgb A1c MFr Bld  Date/Time Value Ref Range Status  11/09/2021 12:17 AM 6.5 (H) 4.8 - 5.6 % Final    Comment:    (NOTE) Pre diabetes:          5.7%-6.4%  Diabetes:              >6.4%  Glycemic control for   <7.0% adults with diabetes   07/28/2021 10:50 AM 6.7 (H) 4.6 - 6.5 % Final    Comment:    Glycemic Control Guidelines for People with Diabetes:Non Diabetic:  <6%Goal of Therapy: <7%Additional Action Suggested:  >8%     CBG: Recent Labs  Lab 11/10/21 0758 11/10/21 1158 11/10/21 1555 11/10/21 2107 11/11/21 0728  GLUCAP 174* 175* 224* 228* 313*    Review of Systems:   States is comfortable Currently not having any chest pain or chest discomfort Breathing is  comfortable on BiPAP  Past Medical History:  He,  has a past medical history of Allergic rhinitis, Aortic dissection (HCC), CAD (coronary artery disease), COPD with asthma (HCC), Diabetes mellitus, Hyperlipidemia, Lichen planus, SCC (squamous cell carcinoma), and Shingles.   Surgical History:   Past Surgical History:  Procedure Laterality Date   BACK SURGERY  1993   CARDIAC CATHETERIZATION     left, w/ coronary angiography and left ventriculograpy   CARDIOVERSION N/A 09/05/2020   Procedure: CARDIOVERSION;  Surgeon: Wendall Stade, MD;  Location[facility] ENDOSCOPY;  Service: Cardiovascular;  Laterality: N/A;   LEFT HEART CATHETERIZATION WITH CORONARY ANGIOGRAM N/A 06/09/2013   Procedure: LEFT HEART CATHETERIZATION WITH CORONARY ANGIOGRAM;  Surgeon: Wendall Stade, MD;  Location: [facility] CATH LAB;  Service: Cardiovascular;  Laterality: N/A;   PACEMAKER IMPLANT N/A 11/06/2020   Procedure: PACEMAKER IMPLANT;  Surgeon: Marinus Maw, MD;  Location: [facility] INVASIVE CV LAB;  Service: Cardiovascular;  Laterality: N/A;   PERCUTANEOUS CORONARY STENT INTERVENTION (PCI-S) N/A 06/12/2013    Procedure: PERCUTANEOUS CORONARY STENT INTERVENTION (PCI-S);  Surgeon: Kathleene Hazel, MD;  Location[facility] CATH LAB;  Service: Cardiovascular;  Laterality: N/A;  prox LAD   PTCA     w/ placement of drugeluting stent in the distal right coronary artery, and distal Cardiologist: Loraine Leriche W.Pulsipher     Social History:   reports that he quit smoking about 29 years ago. His smoking use included cigarettes. He has a 58.50 pack-year smoking history. He has never used smokeless tobacco. He reports that he does not drink alcohol and does not use drugs.   Family History:  His family history includes Asthma in his mother; Cancer in his mother; Heart disease in his father and mother. There is no history of Prostate cancer or Colon cancer.   Allergies No Known Allergies   Home Medications  Prior to Admission medications   Medication Sig Start Date End Date Taking? Authorizing Provider  albuterol (PROAIR HFA) 108 (90 Base) MCG/ACT inhaler 2 puffs up to every 4 hours as needed only  if your can't catch your breath 08/12/21  Yes Nyoka Cowden, MD  apixaban (ELIQUIS) 5 MG TABS tablet Take 1 tablet (5 mg total) by mouth 2 (two) times daily. 07/29/20  Yes Wendall Stade, MD  aspirin EC 81 MG tablet Take 81 mg by mouth every evening. Swallow whole.   Yes [provider]  dextromethorphan-guaiFENesin (MUCINEX DM) 30-600 MG 12hr tablet Take 1 tablet by mouth 2 (two) times daily as needed for cough.   Yes [provider]  fluticasone (FLONASE) 50 MCG/ACT nasal spray Place 2 sprays into both nostrils daily as needed for allergies or rhinitis.   Yes [provider]  fluticasone (FLOVENT HFA) 220 MCG/ACT inhaler Inhale 2 puffs into the lungs 2 (two) times daily. 10/06/21  Yes Hunsucker, Lesia Sago, MD  isosorbide mononitrate (IMDUR) 30 MG 24 hr tablet Take 1 tablet by mouth twice daily Patient taking differently: Take 30 mg by mouth daily. 07/11/21  Yes [name], [name], MD  LAGEVRIO 200 MG  CAPS capsule Take 4 capsules by mouth every 12 (twelve) hours. 11/07/21  Yes [provider]  metFORMIN (GLUCOPHAGE) 500 MG tablet Take 1 tablet by mouth once daily with breakfast Patient taking differently: Take 500 mg by mouth daily with breakfast. 10/31/21  Yes Wanda Plump, MD  metoprolol succinate (TOPROL-XL) 25 MG 24 hr tablet Take 0.5 tablets (12.5 mg total) by mouth daily. (Replaces metoprolol TARTRATE) 08/11/21  Yes Wanda Plump, MD  Multiple Vitamin (MULTIVITAMIN WITH MINERALS) TABS tablet Take 1 tablet by mouth daily.   Yes [provider]  ondansetron (ZOFRAN) 4 MG tablet Take 1 tablet (4 mg total) by mouth 2 (two) times daily as needed for nausea or vomiting. 10/02/21  Yes [name], [name], MD  pravastatin (PRAVACHOL) 40 MG tablet Take 1 tablet by mouth once daily Patient taking differently: Take 40 mg by mouth every evening. 04/24/21  Yes [name], [name], MD  Tiotropium Bromide-Olodaterol (STIOLTO RESPIMAT) 2.5-2.5 MCG/ACT AERS Inhale 2 puffs into the lungs daily. 02/25/21  Yes Nyoka Cowden, MD  vitamin B-12 (CYANOCOBALAMIN) 100 MCG tablet Take 100 mcg by mouth daily.   Yes [provider]  Fluticasone-Umeclidin-Vilant (TRELEGY ELLIPTA) 100-62.5-25 MCG/ACT AEPB Inhale 1 puff into the lungs daily at 6 (six) AM. Patient not taking: Reported on 10/06/2021 09/17/21   Bevelyn Ngo, NP  glucose blood (FREESTYLE TEST STRIPS) test strip Check blood sugar no more than twice daily 06/19/20   Wanda Plump, MD  nitroGLYCERIN (NITROSTAT) 0.4 MG SL tablet Place 1 tablet (0.4 mg total) under the tongue every 5 (five) minutes as needed for chest pain (up to 3 doses only.). Patient not taking: Reported on 11/08/2021 12/30/18   Wendall Stade, MD    The patient is critically ill with multiple organ systems failure and requires high complexity decision making for assessment and support, frequent evaluation and titration of therapies, application of advanced monitoring technologies and extensive  interpretation of multiple databases. Critical Care Time devoted to patient care services described in this note independent of APP/resident time (if applicable)  is 35 minutes.   Virl Diamond MD [facility] Personal pager: See Amion If unanswered, please page CCM On-call: #[phone]

## 2021-11-11 NOTE — Progress Notes (Signed)
ANTICOAGULATION CONSULT NOTE ? ?Pharmacy Consult for IV heparin ?Indication: chest pain/ACS ? ?No Known Allergies ? ?Patient Measurements: ?Height: '5\' 9"'$  (175.3 cm) ?Weight: 76.2 kg (167 lb 15.9 oz) ?IBW/kg (Calculated) : 70.7 ?Heparin Dosing Weight: TBW ? ?Vital Signs: ?Temp: 97.6 ?F (36.4 ?C) (03/21 0400) ?Temp Source: Axillary (03/21 0400) ?BP: 175/101 (03/21 1200) ?Pulse Rate: 103 (03/21 1200) ? ?Labs: ?Recent Labs  ?  11/07/2021 ?2100 11/05/2021 ?2300 11/09/21 ?0055 11/09/21 ?0253 11/10/21 ?4742 11/10/21 ?0423 11/10/21 ?1053 11/10/21 ?1547 11/10/21 ?2033 11/11/21 ?5956 11/11/21 ?0740 11/11/21 ?0920  ?HGB 14.3  --  12.2*  --  14.5  --   --   --   --  13.4  --   --   ?HCT 41.5  --  35.2*  --  43.3  --   --   --   --  38.8*  --   --   ?PLT 297  --  243  --  286  --   --   --   --  302  --   --   ?APTT  --   --   --    < > 73*  --  117*  --  72* 49*  --   --   ?LABPROT 16.1*  --   --   --   --   --   --   --   --   --   --   --   ?INR 1.3*  --   --   --   --   --   --   --   --   --   --   --   ?HEPARINUNFRC  --   --   --   --  >1.10*  --   --   --   --  >1.10*  --   --   ?CREATININE 0.72   < >  --    < > 0.97  --   --  0.87  --  0.87  --   --   ?TROPONINIHS 90*   < >  --    < >  --  3,875*  --   --   --   --  2,024* 2,095*  ? < > = values in this interval not displayed.  ? ? ? ?Estimated Creatinine Clearance: 57.6 mL/min (by C-G formula based on SCr of 0.87 mg/dL). ? ?Assessment: ?38 yoM w/ PMH CAD s/p PCI '97, DM2, COPD, AAA with Hx distal dissection, admitted for COVID PNA. Noted to have some chest pain and rising troponins, and Pharmacy consulted to dose heparin for ACS. ? ?Baseline INR slightly elevated d/t Eliquis, aPTT not done ?Prior anticoagulation: Eliquis 5 mg PO bid, LD 3/19 at 0921 ? ? ?Today, 11/11/2021: ?Heparin level > 1.10 (falsely elevated due to lingering effects of Eliquis) ?aPTT = 49 sec (subtherapeutic) on heparin infusion at 800 units/hr ?CBC stable ?No complications of therapy noted  ? ? ?Goal of  Therapy: ?Heparin level 0.3-0.7 units/ml ?aPTT 66-102 sec ?Monitor platelets by anticoagulation protocol: Yes ? ?Plan: ?Increase heparin infusion to 900 units/hr ?Check aPTT 9 hours after rate change ?Daily CBC, aPTT & daily heparin level once stable ?Monitor for signs of bleeding or thrombosis ? ?Tawnya Crook, PharmD, BCPS ?Clinical Pharmacist ?11/11/2021 2:14 PM ? ? ?

## 2021-11-12 DIAGNOSIS — J9601 Acute respiratory failure with hypoxia: Secondary | ICD-10-CM | POA: Diagnosis not present

## 2021-11-12 LAB — CBC WITH DIFFERENTIAL/PLATELET
Abs Immature Granulocytes: 0.1 10*3/uL — ABNORMAL HIGH (ref 0.00–0.07)
Basophils Absolute: 0 10*3/uL (ref 0.0–0.1)
Basophils Relative: 0 %
Eosinophils Absolute: 0 10*3/uL (ref 0.0–0.5)
Eosinophils Relative: 0 %
HCT: 35.6 % — ABNORMAL LOW (ref 39.0–52.0)
Hemoglobin: 11.7 g/dL — ABNORMAL LOW (ref 13.0–17.0)
Immature Granulocytes: 1 %
Lymphocytes Relative: 3 %
Lymphs Abs: 0.5 10*3/uL — ABNORMAL LOW (ref 0.7–4.0)
MCH: 30.2 pg (ref 26.0–34.0)
MCHC: 32.9 g/dL (ref 30.0–36.0)
MCV: 92 fL (ref 80.0–100.0)
Monocytes Absolute: 0.9 10*3/uL (ref 0.1–1.0)
Monocytes Relative: 5 %
Neutro Abs: 15.2 10*3/uL — ABNORMAL HIGH (ref 1.7–7.7)
Neutrophils Relative %: 91 %
Platelets: 273 10*3/uL (ref 150–400)
RBC: 3.87 MIL/uL — ABNORMAL LOW (ref 4.22–5.81)
RDW: 13.6 % (ref 11.5–15.5)
WBC: 16.7 10*3/uL — ABNORMAL HIGH (ref 4.0–10.5)
nRBC: 0 % (ref 0.0–0.2)

## 2021-11-12 LAB — COMPREHENSIVE METABOLIC PANEL
ALT: 28 U/L (ref 0–44)
AST: 29 U/L (ref 15–41)
Albumin: 2.8 g/dL — ABNORMAL LOW (ref 3.5–5.0)
Alkaline Phosphatase: 47 U/L (ref 38–126)
Anion gap: 5 (ref 5–15)
BUN: 34 mg/dL — ABNORMAL HIGH (ref 8–23)
CO2: 30 mmol/L (ref 22–32)
Calcium: 8.6 mg/dL — ABNORMAL LOW (ref 8.9–10.3)
Chloride: 101 mmol/L (ref 98–111)
Creatinine, Ser: 0.84 mg/dL (ref 0.61–1.24)
GFR, Estimated: >60 mL/min (ref >60)
Glucose, Bld: 259 mg/dL — ABNORMAL HIGH (ref 70–99)
Potassium: 4.5 mmol/L (ref 3.5–5.1)
Sodium: 136 mmol/L (ref 135–145)
Total Bilirubin: 0.2 mg/dL — ABNORMAL LOW (ref 0.3–1.2)
Total Protein: 5.3 g/dL — ABNORMAL LOW (ref 6.5–8.1)

## 2021-11-12 LAB — C-REACTIVE PROTEIN: CRP: 4.6 mg/dL — ABNORMAL HIGH (ref <1.0)

## 2021-11-12 LAB — GLUCOSE, CAPILLARY
Glucose-Capillary: 251 mg/dL — ABNORMAL HIGH (ref 70–99)
Glucose-Capillary: 319 mg/dL — ABNORMAL HIGH (ref 70–99)
Glucose-Capillary: 206 mg/dL — ABNORMAL HIGH (ref 70–99)
Glucose-Capillary: 189 mg/dL — ABNORMAL HIGH (ref 70–99)

## 2021-11-12 LAB — HEPARIN LEVEL (UNFRACTIONATED)
Heparin Unfractionated: 1.06 IU/mL — ABNORMAL HIGH (ref 0.30–0.70)
Heparin Unfractionated: >1.1 IU/mL — ABNORMAL HIGH (ref 0.30–0.70)

## 2021-11-12 LAB — APTT
aPTT: 120 seconds — ABNORMAL HIGH (ref 24–36)
aPTT: 138 seconds — ABNORMAL HIGH (ref 24–36)

## 2021-11-12 LAB — D-DIMER, QUANTITATIVE: D-Dimer, Quant: 0.68 ug/mL-FEU — ABNORMAL HIGH (ref 0.00–0.50)

## 2021-11-12 MED ORDER — HEPARIN (PORCINE) 25000 UT/250ML-% IV SOLN
800.0000 [IU]/h | INTRAVENOUS | Status: DC
  Administered 2021-11-12 – 2021-11-13 (×2): 800 [IU]/h via INTRAVENOUS
  Filled 2021-11-12: qty 250

## 2021-11-12 MED ORDER — LACTATED RINGERS IV SOLN: INTRAVENOUS | Status: DC

## 2021-11-12 MED ORDER — REVEFENACIN 175 MCG/3ML IN SOLN
175.0000 ug | Freq: Every day | RESPIRATORY_TRACT | Status: DC
  Administered 2021-11-13 – 2021-11-18 (×6): 175 ug via RESPIRATORY_TRACT
  Filled 2021-11-12 (×6): qty 3

## 2021-11-12 MED ORDER — SODIUM CHLORIDE 0.9 % IV SOLN
500.0000 mg | INTRAVENOUS | Status: DC
  Administered 2021-11-13: 500 mg via INTRAVENOUS
  Filled 2021-11-12: qty 5

## 2021-11-12 MED ORDER — SODIUM CHLORIDE 0.9 % IV SOLN
2.0000 g | INTRAVENOUS | Status: AC
  Administered 2021-11-13 – 2021-11-15 (×3): 2 g via INTRAVENOUS
  Filled 2021-11-12 (×3): qty 20

## 2021-11-12 NOTE — Progress Notes (Signed)
Pharmacy: Re- heparin ? ?Patient is an 88 y.o M with hx CAD and Afib on Eliquis PTA, presented to the ED on 10/30/2021 with c/o SOB and fever.  He was recently diagnosed with COVID on 11/06/21 and on Molnipiravir PTA. Eliquis resumed on admission and changed to heparin drip on 11/09/21 after troponin was found to be elevated. He's currently on heparin drip for NSTEMI. ? ?- last dose of Eliquis given to patient on 11/09/21 at 0921 ? ?- heparin level collected at 2:12p remains elevated at >1.10 and aPTT is also elevated at 138 secs. Per pt's RN, labs collected on the correct arm and drip is currently infusing at 1000 units/hr ? ?Goal of Therapy: ?Heparin level 0.3-0.7 units/ml ?aPTT 66-102 sec ?Monitor platelets by anticoagulation protocol: Yes ? ?Plan:  ?- Hold heparin drip for 1 hour, then resume back at 800 units/hr ?- check 8 hr aPTT and heparin level ?- monitor for s/sx bleeding ? ?Dia Sitter, PharmD, BCPS ?11/12/2021 3:05 PM ? ?

## 2021-11-12 NOTE — Progress Notes (Signed)
? ?NAME:  Phillip Jordan, MRN:  [id number], DOB:  [birth year], LOS: 4 ?ADMISSION DATE:  10/25/2021, CONSULTATION DATE:  11/11/2021 ?REFERRING MD: Dr. Verlon Au, CHIEF COMPLAINT: Acute hypoxemic respiratory failure, COVID pneumonia ? ?History of Present Illness:  ?88 year old gentleman who presented with shortness of breath. ?Found to have COVID for which she has been treated at present ?Increase in markers of inflammation with increasing shortness of breath with chest discomfort, does have underlying coronary artery disease ?Background history of coronary artery disease status post stenting, diabetes type 2, stage III COPD, permanent pacemaker placement for tachybradycardia syndrome, atrial flutter. ?Was having shortness of breath with a cough, home COVID testing report was positive-was prescribed molnupiravir as outpatient.  Worsening shortness of breath led to presentation to the hospital ? ?Started on empiric antibiotics, COVID treatment protocol ? ?Pertinent  Medical History  ? ?Past Medical History:  ?Diagnosis Date  ? Allergic rhinitis   ? Aortic dissection ([place])   ? f/u by cards - AAA with distal dissection.  ? CAD (coronary artery disease)   ? a. s/p MI 1992. b. Prior hx stenting to RCA/Cx. b. 05/2013: [place] s/p DES to distal RCA then staged DES to prox LAD.   ? COPD with asthma ([place])   ? Diabetes mellitus   ? Hyperlipidemia   ? Lichen planus   ? Right midline inferior chest, Dr. [name] Sensing  ? SCC (squamous cell carcinoma)   ? Shingles   ? ?Significant Hospital Events: ?Including procedures, antibiotic start and stop dates in addition to other pertinent events   ? ?Admitted. Echocardiogram 11/09/2021 with ejection fraction of 40 to 90%, diastolic dysfunction. Azith and CTX started.  Systemic steroids started. Got Actemra  ?3/21 pulm consulted ?3/22 getting better CRPs down O2 needs down  ?Interim History / Subjective:  ?Feels better  ?Objective   ?Blood pressure (Abnormal) 131/55, pulse 75, temperature 98 ?F  (36.7 ?C), temperature source Axillary, resp. rate 11, height '5\' 9"'$  (1.753 m), weight 76.2 kg, SpO2 97 %. ?   ?   ? ?Intake/Output Summary (Last 24 hours) at 11/12/2021 2409 ?Last data filed at 11/12/2021 0400 ?Gross per 24 hour  ?Intake 1156.61 ml  ?Output 1000 ml  ?Net 156.61 ml  ? ?Filed Weights  ? 10/30/2021 2059  ?Weight: 76.2 kg  ? ? ?Examination: ?General 88 year old male. No distress  ?HENT NCAT no JVD  ?Pulm dec bases. No accessory use currently 6 LPM sats 100% ?Pcxr: as of 3/21 worsening R>L airspace disease.  ?Card rrr ?Abd soft ?Ext warm and dry  ?Neuro intact  ?Resolved Hospital Problem list   ? ?Hyponatremia->resolved 2/2 volume depletion  ? ?Assessment & Plan:  ?Acute hypoxic respiratory failure 2/2 COVID-pneumonia, AECOPD (baseline FEV1 37% GOLD III COPD)  ?S/p actemera, Completed Remdesivir, CRPs improving ?Plan ?Cont systemic steroids. Now on Pred 50 (completed 3d solumedrol). Will hold her for now. Start taper in next day or so.  ?Cont to trend CRPs ?Wean Oxygen  ?BIPAP PRN  ?Cont BDs ?On day 4 of 5 CTX and azith  ?OOB  ?Adv diet ?Cont IS and flutter ? ?Non-ST elevation MI (demand ischemia) likely 2/2 COVID, has known h/o CAD and stents  ?Plan ?Cont asa  ?Cont Imdur  ?Cont Medical rx  ? ?Chronic  systolic and diastolic HF (new reduced EF 40-45% w/ global hypokinesis) ?Plan ?Re-assess for GDMT when/if improves ?Tele  ? ?Paroxysmal atrial flutter ?Plan ?Currently on IV heparin  ?DOAC eventually  ? ?Hyperglycemia (steroid induced)  ?Plan ?  Ssi & Levemir  ? ?Best Practice (right click and "Reselect all SmartList Selections" daily)  ? ?Per primary ? ?Erick Colace ACNP-BC ?[place] ?Pager # [phone] OR # [phone] if no answer ? ? ? ?

## 2021-11-12 NOTE — Progress Notes (Signed)
ANTICOAGULATION CONSULT NOTE ? ?Pharmacy Consult for IV heparin ?Indication: chest pain/ACS ? ?No Known Allergies ? ?Patient Measurements: ?Height: '5\' 9"'$  (175.3 cm) ?Weight: 76.2 kg (167 lb 15.9 oz) ?IBW/kg (Calculated) : 70.7 ?Heparin Dosing Weight: TBW ? ?Vital Signs: ?Temp: 97.8 ?F (36.6 ?C) (03/22 0143) ?Temp Source: Oral (03/22 0143) ?BP: 158/92 (03/22 0400) ?Pulse Rate: 66 (03/22 0400) ? ?Labs: ?Recent Labs  ?  11/10/21 ?7673 11/10/21 ?0423 11/10/21 ?1053 11/10/21 ?1547 11/10/21 ?2033 11/11/21 ?4193 11/11/21 ?0740 11/11/21 ?0920 11/11/21 ?7902 11/12/21 ?4097  ?HGB 14.5  --   --   --   --  13.4  --   --   --  11.7*  ?HCT 43.3  --   --   --   --  38.8*  --   --   --  35.6*  ?PLT 286  --   --   --   --  302  --   --   --  273  ?APTT 73*  --    < >  --    < > 49*  --   --  54* 120*  ?HEPARINUNFRC >1.10*  --   --   --   --  >1.10*  --   --   --  1.06*  ?CREATININE 0.97  --   --  0.87  --  0.87  --   --   --  0.84  ?TROPONINIHS  --  3,532*  --   --   --   --  2,024* 2,095*  --   --   ? < > = values in this interval not displayed.  ? ? ? ?Estimated Creatinine Clearance: 59.6 mL/min (by C-G formula based on SCr of 0.84 mg/dL). ? ?Assessment: ?5 yoM w/ PMH CAD s/p PCI '97, DM2, COPD, AAA with Hx distal dissection, admitted for COVID PNA. Noted to have some chest pain and rising troponins, and Pharmacy consulted to dose heparin for ACS. ? ?Baseline INR slightly elevated d/t Eliquis, aPTT not done ?Prior anticoagulation: Eliquis 5 mg PO bid, LD 3/19 at 0921 ? ? ?Today, 11/12/2021: ?Heparin level > 1.06 (falsely elevated due to lingering effects of Eliquis) ?aPTT = 120 supra-therapeutic on 1100 units/hr ?CBC stable ?No bleeding per RN ? ? ?Goal of Therapy: ?Heparin level 0.3-0.7 units/ml ?aPTT 66-102 sec ?Monitor platelets by anticoagulation protocol: Yes ? ?Plan: ?Decrease heparin drip to 1000 units/hr ?Check aPTT 8 hours after rate change ?Daily CBC, aPTT & daily heparin level once stable ?Monitor for signs of bleeding  or thrombosis ? ?Dolly Rias RPh ?11/12/2021, 6:00 AM ? ?

## 2021-11-12 NOTE — Progress Notes (Signed)
? ?Progress Note ? ?Patient Name: Phillip Jordan ?Date of Encounter: 11/12/2021 ? ?[facility] Cardiologist: Jenkins Rouge, MD  ? ?Subjective  ? ?Feeling better, off bipap. Seen in ICU with wife, daughter and son at bedside.  ? ?Inpatient Medications  ?  ?Scheduled Meds: ? arformoterol  15 mcg Nebulization BID  ? aspirin EC  81 mg Oral QPM  ? chlorhexidine  15 mL Mouth Rinse BID  ? Chlorhexidine Gluconate Cloth  6 each Topical Daily  ? insulin aspart  0-9 Units Subcutaneous TID WC  ? insulin detemir  10 Units Subcutaneous Daily  ? isosorbide mononitrate  30 mg Oral Daily  ? levalbuterol  0.63 mg Nebulization Q6H  ? mouth rinse  15 mL Mouth Rinse q12n4p  ? metoprolol tartrate  12.5 mg Oral BID  ? nitroGLYCERIN  0.5 inch Topical Q8H  ? predniSONE  50 mg Oral Daily  ? [START ON 11/13/2021] revefenacin  175 mcg Nebulization Daily  ? ?Continuous Infusions: ? [START ON 11/13/2021] azithromycin    ? [START ON 11/13/2021] cefTRIAXone (ROCEPHIN)  IV    ? heparin 800 Units/hr (11/12/21 1635)  ? lactated ringers 75 mL/hr at 11/12/21 1258  ? ?PRN Meds: ?acetaminophen **OR** acetaminophen, chlorpheniramine-HYDROcodone, guaiFENesin-dextromethorphan, metoprolol tartrate, nitroGLYCERIN, prochlorperazine  ? ?Vital Signs  ?  ?Vitals:  ? 11/12/21 1350 11/12/21 1400 11/12/21 1612 11/12/21 2148  ?BP:  (!) 168/101 (!) 149/80 135/68  ?Pulse:  94 95 85  ?Resp:  '17 20 15  '$ ?Temp:   97.8 ?F (36.6 ?C) 98.2 ?F (36.8 ?C)  ?TempSrc:   Oral Oral  ?SpO2: 99% 92% 100% 100%  ?Weight:      ?Height:      ? ? ?Intake/Output Summary (Last 24 hours) at 11/12/2021 2216 ?Last data filed at 11/12/2021 1818 ?Gross per 24 hour  ?Intake 2426.65 ml  ?Output 425 ml  ?Net 2001.65 ml  ? ? ?  11/06/2021  ?  8:59 PM 10/27/2021  ? 10:53 AM 10/17/2021  ?  1:40 PM  ?Last 3 Weights  ?Weight (lbs) 167 lb 15.9 oz 168 lb 2 oz 161 lb  ?Weight (kg) 76.2 kg 76.261 kg 73.029 kg  ?   ? ?Telemetry  ?  ?paced - Personally Reviewed ? ?ECG  ?  ?asvp - Personally Reviewed ? ?Physical Exam   ? ?GEN: No acute distress.   ?Neck: No JVD ?Cardiac: RRR, no murmurs, rubs, or gallops.  ?Respiratory: Clear to auscultation bilaterally. ?GI: Soft, nontender, non-distended  ?MS: No edema; No deformity. ?Neuro:  Nonfocal  ?Psych: Normal affect  ? ?Labs  ?  ?High Sensitivity Troponin:   ?Recent Labs  ?Lab 11/09/21 ?1525 11/09/21 ?1809 11/10/21 ?0423 11/11/21 ?0740 11/11/21 ?0920  ?TROPONINIHS 1,601* 2,823* 3,154* 2,024* 2,095*  ?   ?Chemistry ?Recent Labs  ?Lab 11/10/21 ?0086 11/10/21 ?1547 11/11/21 ?7619 11/12/21 ?0452  ?NA 127* 131* 129* 136  ?K 5.3* 4.3 4.1 4.5  ?CL 94* 97* 97* 101  ?CO2 '23 26 23 30  '$ ?GLUCOSE 185* 205* 267* 259*  ?BUN 23 33* 37* 34*  ?CREATININE 0.97 0.87 0.87 0.84  ?CALCIUM 8.9 8.7* 8.4* 8.6*  ?PROT 7.1  --  6.1* 5.3*  ?ALBUMIN 3.5  --  3.1* 2.8*  ?AST 73*  --  51* 29  ?ALT 31  --  31 28  ?ALKPHOS 75  --  62 47  ?BILITOT 0.3  --  0.2* 0.2*  ?GFRNONAA >60 >60 >60 >60  ?ANIONGAP '10 8 9 5  '$ ?  ?Lipids No  results for input(s): CHOL, TRIG, HDL, LABVLDL, LDLCALC, CHOLHDL in the last 168 hours.  ?Hematology ?Recent Labs  ?Lab 11/10/21 ?2458 11/11/21 ?0998 11/12/21 ?0452  ?WBC 23.1* 22.8* 16.7*  ?RBC 4.75 4.34 3.87*  ?HGB 14.5 13.4 11.7*  ?HCT 43.3 38.8* 35.6*  ?MCV 91.2 89.4 92.0  ?MCH 30.5 30.9 30.2  ?MCHC 33.5 34.5 32.9  ?RDW 13.2 13.3 13.6  ?PLT 286 302 273  ? ?Thyroid No results for input(s): TSH, FREET4 in the last 168 hours.  ?BNP ?Recent Labs  ?Lab 11/04/2021 ?2100  ?BNP 695.6*  ?  ?DDimer  ?Recent Labs  ?Lab 11/10/21 ?3382 11/11/21 ?5053 11/12/21 ?0452  ?DDIMER 1.27* 0.96* 0.68*  ?  ? ?Radiology  ?  ?DG CHEST PORT 1 VIEW ? ?Result Date: 11/11/2021 ?CLINICAL DATA:  88 year old male with increasing shortness of breath. EXAM: PORTABLE CHEST 1 VIEW COMPARISON:  Chest x-ray 11/03/2021. FINDINGS: Left-sided pacemaker device in place with lead tips projecting over the expected location of the right atrium and right ventricle. Lung volumes are normal. Patchy areas of interstitial prominence an ill-defined  airspace consolidation are noted throughout the mid to lower lungs bilaterally, worsened compared to the recent prior study. Small bilateral pleural effusions. No pneumothorax. No evidence of pulmonary edema. Heart size is normal. Upper mediastinal contours are within normal limits. Atherosclerotic calcifications are noted thoracic aorta. IMPRESSION: 1. Worsening aeration throughout the mid to lower lungs bilaterally, suggestive of multilobar bilateral pneumonia, with small bilateral pleural effusions, as above. 2. Aortic atherosclerosis. Electronically Signed   By: Vinnie Langton M.D.   On: 11/11/2021 05:12   ? ?DG CHEST PORT 1 VIEW ? ?Result Date: 11/11/2021 ?CLINICAL DATA:  88 year old male with increasing shortness of breath. EXAM: PORTABLE CHEST 1 VIEW COMPARISON:  Chest x-ray 11/06/2021. FINDINGS: Left-sided pacemaker device in place with lead tips projecting over the expected location of the right atrium and right ventricle. Lung volumes are normal. Patchy areas of interstitial prominence an ill-defined airspace consolidation are noted throughout the mid to lower lungs bilaterally, worsened compared to the recent prior study. Small bilateral pleural effusions. No pneumothorax. No evidence of pulmonary edema. Heart size is normal. Upper mediastinal contours are within normal limits. Atherosclerotic calcifications are noted thoracic aorta. IMPRESSION: 1. Worsening aeration throughout the mid to lower lungs bilaterally, suggestive of multilobar bilateral pneumonia, with small bilateral pleural effusions, as above. 2. Aortic atherosclerosis. Electronically Signed   By: Vinnie Langton M.D.   On: 11/11/2021 05:12  ? ?DG Chest Port 1 View ? ?Result Date: 10/22/2021 ?CLINICAL DATA:  Shortness of breath and dyspnea. EXAM: PORTABLE CHEST 1 VIEW COMPARISON:  Chest x-ray 12/27/2020 FINDINGS: Left-sided pacemaker is again seen. There are increasing patchy airspace opacities in the left lung base. There is no pleural  effusion or pneumothorax. The cardiomediastinal silhouette is within normal limits. No acute fractures. IMPRESSION: 1. Increasing left lower lobe airspace disease worrisome for pneumonia. Follow-up chest x-ray recommended in 4 6 weeks to confirm resolution. Electronically Signed   By: Ronney Asters M.D.   On: 10/23/2021 21:34  ? ?ECHOCARDIOGRAM COMPLETE ? ?Result Date: 11/09/2021 ?   ECHOCARDIOGRAM REPORT   Patient Name:   Phillip Jordan Date of Exam: 11/09/2021 Medical Rec #:  [id number]        Height:       69.0 in Accession #:    [id number]       Weight:       168.0 lb Date of Birth:  03-09-1930  BSA:          1.918 m? Patient Age:    68 years         BP:           125/55 mmHg Patient Gender: M                HR:           111 bpm. Exam Location:  Inpatient Procedure: 2D Echo, Cardiac Doppler and Color Doppler Indications:    I33.9 SBE  History:        Patient has prior history of Echocardiogram examinations, most                 recent 08/21/2020. CAD, COPD; Risk Factors:Diabetes and                 Dyslipidemia.  Sonographer:    Beryle Beams Referring Phys: [id number] Rise Patience  Sonographer Comments: Image acquisition challenging due to respiratory motion. IMPRESSIONS  1. Left ventricular ejection fraction, by estimation, is 40 to 45%. The left ventricle has mildly decreased function. The left ventricle demonstrates global hypokinesis. Left ventricular diastolic parameters are consistent with Grade I diastolic dysfunction (impaired relaxation).  2. Right ventricular systolic function is normal. The right ventricular size is mildly enlarged.  3. The mitral valve is grossly normal. No evidence of mitral valve regurgitation.  4. The aortic valve was not well visualized. Aortic valve regurgitation is not visualized.  5. The inferior vena cava is normal in size with greater than 50% respiratory variability, suggesting right atrial pressure of 3 mmHg. Conclusion(s)/Recommendation(s): EF more reduced from  prior 08/21/2020. FINDINGS  Left Ventricle: Left ventricular ejection fraction, by estimation, is 40 to 45%. The left ventricle has mildly decreased function. The left ventricle demonstrates global hypoki

## 2021-11-12 NOTE — Progress Notes (Signed)
We will s/o now that he is out of the ICU ?Please feel free to call if needed.  ? ?Erick Colace ACNP-BC ?[place] ?Pager # [phone] OR # [phone] if no answer ? ?

## 2021-11-12 NOTE — Progress Notes (Signed)
?PROGRESS NOTE ? ? ?Phillip Jordan  ZOX:[id number] DOB: 20-Aug-1930 DOA: 11/16/2021 ?PCP: Colon Branch, MD  ?Brief Narrative:  ? ?88 year old [place] veteran white male ?CAD stenting initially 1997 = DES RCA DES LAD 05/2013-intolerant to beta-blockade secondary to bradycardia-underwent PPM placement 10/2020 [employer] ?AAA with distal dissection previously ?DM TY 2 ?Moderate COPD Gold stage III ?Moderate sensorineural hearing loss followed by [facility] ? ?Patient was diagnosed by home test for COVID 11/06/2021-his wife also tested positive--they live together independently at home ?He has 3 children ?He was started on Paxlovid empirically when diagnosed c Covid ? ?He tells me he started feeling poorly around Friday-no diarrhea no chills no rigors no other issues he has no chest pain he has no vision loss unilateral weakness ?-He has had anorexia however and has not really eaten for the past several days-he is not on any thiazide diuretics and has had no new change in medications ? ?On initial evaluation by EMS his sats were in the upper 70s he had a low-grade fever at home-he was treated with 1 g Tylenol 5 albuterol Atrovent 2 of mag and 125 Solu-Medrol and an IV was placed ?Cefepime vancomycin were started on admission-BiPAP was also initiated in the ED-blood pressure was elevated-conservative fluid strategy was pursued ? ?Sodium was 118-he was mentating well however there for consideration for 3% although given was not initiated ?White count was 25 hemoglobin 12.2 lactic acid 3.3 ?COVID-specific labs not obtained ? ?Needed Bipap for several days--then troponin bumped to~ 6000, cardiology consulted placed on GDMT ? ?He continues to improve and is going to PCU today ? ? ?Hospital-Problem based course ? ?COVID-positive with acute respiratory failure ?Complete Remdesivir/Solu-Medrol 80 every 12 for 3 days and then prednisone 50 daily ?CRP markedly elevated-Actemra X1 3/20-CRP down to 4 ?No bipap x >24 hours--now hi-flo [place]  oxygen ?Underlying R sided PNA ?7 days total IV abx-then stop ?Need SLP input and grad diet subsequently form liquids ?NSTEMI in setting of COVID worsening respiratory distress ?Prior CAD stenting 2014 ?PPM Dr. [name] paced rhythm ?Continue metoprolol 12.5 twice daily ?Continue heparin, nitro, aspirin as able ?Further plans per cardiology re: cath vs not ?Severe hyponatremia 2/2 volume depletion hypovolemia ?Sodium now 136 ?Patient is mentating well ?His volume depletion is stable ?Moderate gold COPD stage III ?Mild compensatory resp alkalosis ?Supportive management-add Xopenex as able ?Resp alkalosis is improved ?Diabetes mellitus type 2 with worsening hyperglycemia secondary to IV steroids ?^ Levemir 10 -->15 units ?continue sliding scale coverage- ? ? ?DVT prophylaxis: Patient on IV heparin ?Code Status: Patient is DNR after extensive conversations ?Family Communication: long discussions at bedside with youngest daughter ?Disposition:  ?Status is: Inpatient ?Remains inpatient appropriate because: Hemodynamically unstable ?  ?Consultants:  ?None ? ?Procedures: No ? ?Antimicrobials: Vancomycin cefepime ? ? ?Subjective: ? ?Looks great out in chair ?Off bipap now >24h ?No cp, no arm pain ?No stool no fever no chills ? ? ?Objective: ?Vitals:  ? 11/12/21 0827 11/12/21 0900 11/12/21 1000 11/12/21 1100  ?BP:   (!) 147/70   ?Pulse:  89 73   ?Resp:  17 12   ?Temp: 98 ?F (36.7 ?C)   98 ?F (36.7 ?C)  ?TempSrc: Axillary   Oral  ?SpO2: 97% 100% 100%   ?Weight:      ?Height:      ? ? ?Intake/Output Summary (Last 24 hours) at 11/12/2021 1220 ?Last data filed at 11/12/2021 0400 ?Gross per 24 hour  ?Intake 725.89 ml  ?Output 825 ml  ?  Net -99.11 ml  ? ? ?Filed Weights  ? 11/02/2021 2059  ?Weight: 76.2 kg  ? ? ?Examination: ? ?Siting in chair ?No ^ wob, looks well on hi flo [place] ?No icterus no pallor ?Chest clear anteriorly  ?Posteriorly RLL fields some crackles, dec AE ?Abdomen soft no rebound ?ROM intact ?Power 5/5 ? ?Data Reviewed:  personally reviewed  ? ?CBC ?   ?Component Value Date/Time  ? WBC 16.7 (H) 11/12/2021 0452  ? RBC 3.87 (L) 11/12/2021 0452  ? HGB 11.7 (L) 11/12/2021 0452  ? HGB 15.2 10/11/2020 0953  ? HCT 35.6 (L) 11/12/2021 0452  ? HCT 44.1 10/11/2020 0953  ? PLT 273 11/12/2021 0452  ? PLT 282 10/11/2020 0953  ? MCV 92.0 11/12/2021 0452  ? MCV 89 10/11/2020 0953  ? MCH 30.2 11/12/2021 0452  ? MCHC 32.9 11/12/2021 0452  ? RDW 13.6 11/12/2021 0452  ? RDW 11.7 10/11/2020 0953  ? LYMPHSABS 0.5 (L) 11/12/2021 0452  ? LYMPHSABS 1.6 10/11/2020 0953  ? MONOABS 0.9 11/12/2021 0452  ? EOSABS 0.0 11/12/2021 0452  ? EOSABS 0.3 10/11/2020 0953  ? BASOSABS 0.0 11/12/2021 0452  ? BASOSABS 0.1 10/11/2020 0953  ? ? ?  Latest Ref Rng & Units 11/12/2021  ?  4:52 AM 11/11/2021  ?  2:45 AM 11/10/2021  ?  3:47 PM  ?CMP  ?Glucose 70 - 99 mg/dL 259   267   205    ?BUN 8 - 23 mg/dL 34   37   33    ?Creatinine 0.61 - 1.24 mg/dL 0.84   0.87   0.87    ?Sodium 135 - 145 mmol/L 136   129   131    ?Potassium 3.5 - 5.1 mmol/L 4.5   4.1   4.3    ?Chloride 98 - 111 mmol/L 101   97   97    ?CO2 22 - 32 mmol/L '30   23   26    '$ ?Calcium 8.9 - 10.3 mg/dL 8.6   8.4   8.7    ?Total Protein 6.5 - 8.1 g/dL 5.3   6.1     ?Total Bilirubin 0.3 - 1.2 mg/dL 0.2   0.2     ?Alkaline Phos 38 - 126 U/L 47   62     ?AST 15 - 41 U/L 29   51     ?ALT 0 - 44 U/L 28   31     ? ? ? ?Radiology Studies: ?DG CHEST PORT 1 VIEW ? ?Result Date: 11/11/2021 ?CLINICAL DATA:  88 year old male with increasing shortness of breath. EXAM: PORTABLE CHEST 1 VIEW COMPARISON:  Chest x-ray 10/25/2021. FINDINGS: Left-sided pacemaker device in place with lead tips projecting over the expected location of the right atrium and right ventricle. Lung volumes are normal. Patchy areas of interstitial prominence an ill-defined airspace consolidation are noted throughout the mid to lower lungs bilaterally, worsened compared to the recent prior study. Small bilateral pleural effusions. No pneumothorax. No evidence of  pulmonary edema. Heart size is normal. Upper mediastinal contours are within normal limits. Atherosclerotic calcifications are noted thoracic aorta. IMPRESSION: 1. Worsening aeration throughout the mid to lower lungs bilaterally, suggestive of multilobar bilateral pneumonia, with small bilateral pleural effusions, as above. 2. Aortic atherosclerosis. Electronically Signed   By: Vinnie Langton M.D.   On: 11/11/2021 05:12   ? ? ?Scheduled Meds: ? arformoterol  15 mcg Nebulization BID  ? aspirin EC  81 mg Oral QPM  ? chlorhexidine  15 mL Mouth Rinse BID  ? Chlorhexidine Gluconate Cloth  6 each Topical Daily  ? insulin aspart  0-9 Units Subcutaneous TID WC  ? insulin detemir  10 Units Subcutaneous Daily  ? isosorbide mononitrate  30 mg Oral Daily  ? levalbuterol  0.63 mg Nebulization Q6H  ? mouth rinse  15 mL Mouth Rinse q12n4p  ? metoprolol tartrate  12.5 mg Oral BID  ? nitroGLYCERIN  0.5 inch Topical Q8H  ? predniSONE  50 mg Oral Daily  ? revefenacin  175 mcg Nebulization Daily  ? ?Continuous Infusions: ? azithromycin Stopped (11/12/21 0224)  ? cefTRIAXone (ROCEPHIN)  IV Stopped (11/12/21 0600)  ? heparin 1,000 Units/hr (11/12/21 1200)  ? lactated ringers    ? ? ? LOS: 4 days  ? ?Time spent: 25 ? ? ?Nita Sells, MD ?[facility] ?To contact the attending provider between 7A-7P or the covering provider during after hours 7P-7A, please log into the web site [email or web address] and access using universal [place] password for that web site. If you do not have the password, please call the hospital operator. ? ?11/12/2021, 12:20 PM  ? ? ?
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
## 2021-11-13 DIAGNOSIS — J9601 Acute respiratory failure with hypoxia: Secondary | ICD-10-CM | POA: Diagnosis not present

## 2021-11-13 LAB — COMPREHENSIVE METABOLIC PANEL
ALT: 37 U/L (ref 0–44)
AST: 34 U/L (ref 15–41)
Albumin: 2.8 g/dL — ABNORMAL LOW (ref 3.5–5.0)
Alkaline Phosphatase: 47 U/L (ref 38–126)
Anion gap: 5 (ref 5–15)
BUN: 37 mg/dL — ABNORMAL HIGH (ref 8–23)
CO2: 30 mmol/L (ref 22–32)
Calcium: 8.3 mg/dL — ABNORMAL LOW (ref 8.9–10.3)
Chloride: 99 mmol/L (ref 98–111)
Creatinine, Ser: 0.76 mg/dL (ref 0.61–1.24)
GFR, Estimated: >60 mL/min (ref >60)
Glucose, Bld: 177 mg/dL — ABNORMAL HIGH (ref 70–99)
Potassium: 3.5 mmol/L (ref 3.5–5.1)
Sodium: 134 mmol/L — ABNORMAL LOW (ref 135–145)
Total Bilirubin: 0.4 mg/dL (ref 0.3–1.2)
Total Protein: 5.2 g/dL — ABNORMAL LOW (ref 6.5–8.1)

## 2021-11-13 LAB — HEPARIN LEVEL (UNFRACTIONATED)
Heparin Unfractionated: 0.69 IU/mL (ref 0.30–0.70)
Heparin Unfractionated: 0.69 IU/mL (ref 0.30–0.70)
Heparin Unfractionated: 0.28 IU/mL — ABNORMAL LOW (ref 0.30–0.70)

## 2021-11-13 LAB — CBC WITH DIFFERENTIAL/PLATELET
Abs Immature Granulocytes: 0.19 10*3/uL — ABNORMAL HIGH (ref 0.00–0.07)
Basophils Absolute: 0 10*3/uL (ref 0.0–0.1)
Basophils Relative: 0 %
Eosinophils Absolute: 0 10*3/uL (ref 0.0–0.5)
Eosinophils Relative: 0 %
HCT: 32.5 % — ABNORMAL LOW (ref 39.0–52.0)
Hemoglobin: 10.9 g/dL — ABNORMAL LOW (ref 13.0–17.0)
Immature Granulocytes: 1 %
Lymphocytes Relative: 5 %
Lymphs Abs: 0.8 10*3/uL (ref 0.7–4.0)
MCH: 30.7 pg (ref 26.0–34.0)
MCHC: 33.5 g/dL (ref 30.0–36.0)
MCV: 91.5 fL (ref 80.0–100.0)
Monocytes Absolute: 1.6 10*3/uL — ABNORMAL HIGH (ref 0.1–1.0)
Monocytes Relative: 9 %
Neutro Abs: 15 10*3/uL — ABNORMAL HIGH (ref 1.7–7.7)
Neutrophils Relative %: 85 %
Platelets: 264 10*3/uL (ref 150–400)
RBC: 3.55 MIL/uL — ABNORMAL LOW (ref 4.22–5.81)
RDW: 13.3 % (ref 11.5–15.5)
WBC: 17.7 10*3/uL — ABNORMAL HIGH (ref 4.0–10.5)
nRBC: 0 % (ref 0.0–0.2)

## 2021-11-13 LAB — GLUCOSE, CAPILLARY
Glucose-Capillary: 149 mg/dL — ABNORMAL HIGH (ref 70–99)
Glucose-Capillary: 207 mg/dL — ABNORMAL HIGH (ref 70–99)
Glucose-Capillary: 220 mg/dL — ABNORMAL HIGH (ref 70–99)
Glucose-Capillary: 194 mg/dL — ABNORMAL HIGH (ref 70–99)

## 2021-11-13 LAB — APTT
aPTT: 101 seconds — ABNORMAL HIGH (ref 24–36)
aPTT: 69 seconds — ABNORMAL HIGH (ref 24–36)

## 2021-11-13 MED ORDER — HEPARIN (PORCINE) 25000 UT/250ML-% IV SOLN
800.0000 [IU]/h | INTRAVENOUS | Status: DC
  Administered 2021-11-13: 750 [IU]/h via INTRAVENOUS
  Filled 2021-11-13: qty 250

## 2021-11-13 MED ORDER — LEVALBUTEROL HCL 0.63 MG/3ML IN NEBU
0.6300 mg | INHALATION_SOLUTION | Freq: Three times a day (TID) | RESPIRATORY_TRACT | Status: DC
  Administered 2021-11-14 – 2021-11-17 (×11): 0.63 mg via RESPIRATORY_TRACT
  Filled 2021-11-13 (×11): qty 3

## 2021-11-13 MED ORDER — PRAVASTATIN SODIUM 40 MG PO TABS
40.0000 mg | ORAL_TABLET | Freq: Every day | ORAL | Status: DC
  Administered 2021-11-13 – 2021-11-20 (×8): 40 mg via ORAL
  Filled 2021-11-13 (×8): qty 1

## 2021-11-13 MED ORDER — LEVALBUTEROL HCL 0.63 MG/3ML IN NEBU
0.6300 mg | INHALATION_SOLUTION | Freq: Four times a day (QID) | RESPIRATORY_TRACT | Status: DC
  Administered 2021-11-13 (×3): 0.63 mg via RESPIRATORY_TRACT
  Filled 2021-11-13 (×4): qty 3

## 2021-11-13 NOTE — Progress Notes (Signed)
?PROGRESS NOTE ? ? ? ?Phillip Jordan  YIR:[id number] DOB: [birth year] DOA: 11/03/2021 ?PCP: Colon Branch, MD  ? ? ?Brief Narrative:  ?88 year old gentleman with history of coronary artery disease, permanent pacemaker, AAA, type 2 diabetes on insulin, COPD Gold stage III who was diagnosed with COVID-19 on 6/16 at home, treated with Paxlovid and brought to ER because of feeling poorly, anorexia and myalgia.  In the emergency room saturating 70%, low-grade temperature.  Initially needed BiPAP due to respiratory distress, started on antibiotics and admitted to the hospital.  Significant finding with sodium of 118 on presentation with lactic acid of 3.3.  WBC count of 25.  He was admitted to stepdown unit.  Ultimately also found with non-STEMI. ? ? ?Assessment & Plan: ?  ?Pneumonia due to COVID-19 virus, acute hypoxemic respiratory failure secondary to COVID-19: COPD exacerbation due to COVID-19 virus infection. ?Continue to monitor due to significant symptoms  ?chest physiotherapy, incentive spirometry, deep breathing exercises, sputum induction, mucolytic's and bronchodilators. ?Supplemental oxygen to keep saturations more than 90%. ?Covid directed therapy with , ?steroids, patient was on Solu-Medrol, currently on prednisone taper. ?remdesivir, received 5 days therapy, completed. ?actemra, received 1 dose of Actemra on 3/20.  CRP improved. ?antibiotics, treated with Rocephin and azithromycin due to asymmetrical opacity right more than left.   ?Day 5 azithromycin, discontinue. ?Continue Rocephin for 2 more days. ?Initially on BiPAP, currently remains on nasal cannula oxygen.  Keep on nasal cannula oxygen to keep saturation more than 90%. ? ?Non-STEMI in a patient with previous coronary artery disease and stents: ?Currently chest pain-free.  Remains on heparin infusion.  He is on heparin, nitro, aspirin.  Already on beta-blockers.  Cardiology planning for cardiac cath once he is able to lay flat on the cath  table. ? ?Paroxysmal A-fib with sick sinus syndrome status post pacemaker: Stable.  Currently on metoprolol.  Rate controlled.  AV paced rhythm.  On Eliquis at home that is on hold. ? ?Severe hyponatremia secondary to hypovolemia: Presentation sodium 118.  Normalized.  Renal functions adequate.  Continue to monitor. ? ?Type 2 diabetes with hyperglycemia, worse with IV steroids: Currently on long-acting insulin and sliding scale insulin.  Adequately controlled.  Continue similar doses today. ? ? ? ? ?DVT prophylaxis:   Heparin infusion ? ? ?Code Status: DNR ?Family Communication: Daughter at the bedside ?Disposition Plan: Status is: Inpatient ?Remains inpatient appropriate because: Significantly hypoxic, inpatient procedures planned ?  ? ? ?Consultants:  ?PCCM ?Cardiology ? ?Procedures:  ?None ? ?Antimicrobials:  ?Currently on Rocephin azithromycin day 5/7 ?Completed remdesivir ? ? ?Subjective: ?Patient seen and examined.  No overnight events.  Sitting in chair.  Daughter at the bedside.  Has some dry cough but denies any complaints.  Denies any nausea vomiting headache.  Denies any abdomen pain or discomfort. ? ?Objective: ?Vitals:  ? 11/13/21 0047 11/13/21 0644 11/13/21 0852 11/13/21 1118  ?BP: 127/60 134/66 (!) 145/92 126/65  ?Pulse: 72 99 (!) 109 87  ?Resp: '19 15 18 18  '$ ?Temp: 97.8 ?F (36.6 ?C) 98.9 ?F (37.2 ?C)  98.4 ?F (36.9 ?C)  ?TempSrc: Oral Oral  Oral  ?SpO2: 100% 90% 97% 100%  ?Weight:  83.4 kg    ?Height:      ? ? ?Intake/Output Summary (Last 24 hours) at 11/13/2021 1313 ?Last data filed at 11/13/2021 5009 ?Gross per 24 hour  ?Intake 1873.99 ml  ?Output 1000 ml  ?Net 873.99 ml  ? ?Filed Weights  ? 10/29/2021 2059 11/13/21 0644  ?Weight:  76.2 kg 83.4 kg  ? ? ?Examination: ? ?General exam: Appears calm and comfortable  ?Frail and age-appropriate. ?Respiratory system: Some conducted upper airway sounds.  No added sounds. ?SpO2: 100 % ?O2 Flow Rate (L/min): 6 L/min ?FiO2 (%): 30 %  ?Cardiovascular system: S1 & S2  heard, RRR.  Pacemaker in place. ?Gastrointestinal system: Abdomen is nondistended, soft and nontender. No organomegaly or masses felt. Normal bowel sounds heard. ?Central nervous system: Alert and oriented. No focal neurological deficits. ? ? ? ? ?Data Reviewed: I have personally reviewed following labs and imaging studies ? ?CBC: ?Recent Labs  ?Lab 11/09/21 ?0055 11/10/21 ?6294 11/11/21 ?7654 11/12/21 ?6503 11/13/21 ?0110  ?WBC 25.2* 23.1* 22.8* 16.7* 17.7*  ?NEUTROABS 24.4* 21.2* 21.1* 15.2* 15.0*  ?HGB 12.2* 14.5 13.4 11.7* 10.9*  ?HCT 35.2* 43.3 38.8* 35.6* 32.5*  ?MCV 88.2 91.2 89.4 92.0 91.5  ?PLT 243 286 302 273 264  ? ?Basic Metabolic Panel: ?Recent Labs  ?Lab 11/10/21 ?5465 11/10/21 ?1547 11/11/21 ?6812 11/12/21 ?7517 11/13/21 ?0110  ?NA 127* 131* 129* 136 134*  ?K 5.3* 4.3 4.1 4.5 3.5  ?CL 94* 97* 97* 101 99  ?CO2 '23 26 23 30 30  '$ ?GLUCOSE 185* 205* 267* 259* 177*  ?BUN 23 33* 37* 34* 37*  ?CREATININE 0.97 0.87 0.87 0.84 0.76  ?CALCIUM 8.9 8.7* 8.4* 8.6* 8.3*  ? ?GFR: ?Estimated Creatinine Clearance: 62.6 mL/min (by C-G formula based on SCr of 0.76 mg/dL). ?Liver Function Tests: ?Recent Labs  ?Lab 11/16/2021 ?2100 11/10/21 ?0017 11/11/21 ?4944 11/12/21 ?9675 11/13/21 ?0110  ?AST 29 73* 51* 29 34  ?ALT '17 31 31 28 '$ 37  ?ALKPHOS 78 75 62 47 47  ?BILITOT 0.6 0.3 0.2* 0.2* 0.4  ?PROT 7.2 7.1 6.1* 5.3* 5.2*  ?ALBUMIN 3.9 3.5 3.1* 2.8* 2.8*  ? ?No results for input(s): LIPASE, AMYLASE in the last 168 hours. ?No results for input(s): AMMONIA in the last 168 hours. ?Coagulation Profile: ?Recent Labs  ?Lab 11/11/2021 ?2100  ?INR 1.3*  ? ?Cardiac Enzymes: ?No results for input(s): CKTOTAL, CKMB, CKMBINDEX, TROPONINI in the last 168 hours. ?BNP (last 3 results) ?No results for input(s): PROBNP in the last 8760 hours. ?HbA1C: ?No results for input(s): HGBA1C in the last 72 hours. ?CBG: ?Recent Labs  ?Lab 11/12/21 ?1149 11/12/21 ?1626 11/12/21 ?2146 11/13/21 ?0720 11/13/21 ?9163  ?GLUCAP 319* 206* 189* 149* 207*  ? ?Lipid  Profile: ?No results for input(s): CHOL, HDL, LDLCALC, TRIG, CHOLHDL, LDLDIRECT in the last 72 hours. ?Thyroid Function Tests: ?No results for input(s): TSH, T4TOTAL, FREET4, T3FREE, THYROIDAB in the last 72 hours. ?Anemia Panel: ?No results for input(s): VITAMINB12, FOLATE, FERRITIN, TIBC, IRON, RETICCTPCT in the last 72 hours. ?Sepsis Labs: ?Recent Labs  ?Lab 11/06/2021 ?2100 11/02/2021 ?2306 11/09/21 ?8466  ?PROCALCITON  --   --  6.73  ?LATICACIDVEN 2.6* 3.3*  --   ? ? ?Recent Results (from the past 240 hour(s))  ?Resp Panel by RT-PCR (Flu A&B, Covid) Nasopharyngeal Swab     Status: Abnormal  ? Collection Time: 11/07/2021 10:38 PM  ? Specimen: Nasopharyngeal Swab; Nasopharyngeal(NP) swabs in vial transport medium  ?Result Value Ref Range Status  ? SARS Coronavirus 2 by RT PCR POSITIVE (A) NEGATIVE Final  ?  Comment: (NOTE) ?SARS-CoV-2 target nucleic acids are DETECTED. ? ?The SARS-CoV-2 RNA is generally detectable in upper respiratory ?specimens during the acute phase of infection. Positive results are ?indicative of the presence of the identified virus, but do not rule ?out bacterial infection or co-infection with other pathogens  not ?detected by the test. Clinical correlation with patient history and ?other diagnostic information is necessary to determine patient ?infection status. The expected result is Negative. ? ?Fact Sheet for Patients: ?EntrepreneurPulse.com.au ? ?Fact Sheet for Healthcare Providers: ?IncredibleEmployment.be ? ?This test is not yet approved or cleared by the [place] FDA and  ?has been authorized for detection and/or diagnosis of SARS-CoV-2 by ?FDA under an Emergency Use Authorization (EUA).  This EUA will ?remain in effect (meaning this test can be used) for the duration of  ?the COVID-19 declaration under Section 564(b)(1) of the [place], 21 ?U.S[place]C. section 360bbb-3(b)(1), unless the authorization is ?terminated or revoked sooner. ? ?  ? Influenza A by PCR  NEGATIVE NEGATIVE Final  ? Influenza B by PCR NEGATIVE NEGATIVE Final  ?  Comment: (NOTE) ?The Xpert Xpress SARS-CoV-2/FLU/RSV plus assay is intended as an aid ?in the diagnosis of influenza from Nasopharyngeal swa

## 2021-11-13 NOTE — Plan of Care (Signed)
?  Problem: Education: ?Goal: Knowledge of General Education information will improve ?Description: Including pain rating scale, medication(s)/side effects and non-pharmacologic comfort measures ?Outcome: Progressing ?  ?Problem: Clinical Measurements: ?Goal: Respiratory complications will improve ?Outcome: Progressing ?Goal: Cardiovascular complication will be avoided ?Outcome: Progressing ?  ?Problem: Pain Managment: ?Goal: General experience of comfort will improve ?Outcome: Progressing ?  ?

## 2021-11-13 NOTE — Progress Notes (Signed)
ANTICOAGULATION CONSULT NOTE ? ?Pharmacy Consult for IV heparin ?Indication: chest pain/ACS (apixaban for afib on hold) ? ?No Known Allergies ? ?Patient Measurements: ?Height: '5\' 9"'$  (175.3 cm) ?Weight: 83.4 kg (183 lb 13.8 oz) ?IBW/kg (Calculated) : 70.7 ?Heparin Dosing Weight: TBW ? ?Vital Signs: ?Temp: 98.9 ?F (37.2 ?C) (03/23 9166) ?Temp Source: Oral (03/23 0600) ?BP: 145/92 (03/23 4599) ?Pulse Rate: 109 (03/23 0852) ? ?Labs: ?Recent Labs  ?  11/11/21 ?7741 11/11/21 ?0740 11/11/21 ?0920 11/11/21 ?4239 11/12/21 ?5320 11/12/21 ?1412 11/13/21 ?0110 11/13/21 ?2334  ?HGB 13.4  --   --   --  11.7*  --  10.9*  --   ?HCT 38.8*  --   --   --  35.6*  --  32.5*  --   ?PLT 302  --   --   --  273  --  264  --   ?APTT 49*  --   --    < > 120* 138* 101* 69*  ?HEPARINUNFRC >1.10*  --   --   --  1.06* >1.10* 0.69 0.69  ?CREATININE 0.87  --   --   --  0.84  --  0.76  --   ?TROPONINIHS  --  2,024* 2,095*  --   --   --   --   --   ? < > = values in this interval not displayed.  ? ? ? ?Estimated Creatinine Clearance: 62.6 mL/min (by C-G formula based on SCr of 0.76 mg/dL). ? ?Assessment: ?59 yoM w/ PMH significant for CAD s/p PCI '97, T2DM, COPD, AAA with Hx distal dissection, afib (on apixaban PTA). Pt admitted with SOB. COVID+ on 3/16 prior to admission. Pt had chest pain and rising troponin - Troponin peaked at 6988 - pharmacy consulted to dose heparin for ACS/NSTEMI.  ? ?Cardiology following: Planning for possible cardiac cath if/when patient able to lie flat.  ? ?Baseline INR slightly elevated d/t Eliquis, aPTT not done ?Prior anticoagulation: Eliquis 5 mg PO BID, LD 3/19 at 0921 ? ?Today, 11/13/2021: ?aPTT = 69 seconds, HL = 0.69 - both therapeutic on heparin infusion of 800 units/hr ?HL & aPTT have correlated within goal range x2  ?CBC: Hgb (10.9) low and slightly decreased; Plt WNL ?Confirmed with RN that heparin infusing at correct rate. No signs of bleeding.  ? ?Goal of Therapy: ?Heparin level 0.3-0.7 units/ml ?aPTT 66-102  sec ?Monitor platelets by anticoagulation protocol: Yes ? ?Plan: ?Slightly reduce heparin infusion to 750 units/hr since HL on upper end of therapeutic range ?Check 8 hour HL. Can monitor using HL only since HL and aPTT correlating ?CBC, HL daily while on heparin infusion ?Follow for ability to transition back to [place] after procedures ? ?Lenis Noon, PharmD ?11/13/21 ?11:35 AM ?

## 2021-11-13 NOTE — Progress Notes (Signed)
Brief Pharmacy Anti-Coagulation note: ? ?Assessment: ?74 yoM w/ PMH significant for CAD s/p PCI '97, T2DM, COPD, AAA with Hx distal dissection, afib (on apixaban PTA). Pt admitted with SOB. COVID+ on 3/16 prior to admission. Pt had chest pain and rising troponin - Troponin peaked at 6988 - pharmacy consulted to dose heparin for ACS/NSTEMI. ? ?2143 HL 0.28 subtherapeutic on 750 units/hr ?Per RN no known interruptions and no bleeding ? ?Plan: ?Increase heparin back up to 800 units/hr ?Heparin level in 8 hours ?Daily CBC ?Follow for ability to transition back to [place] after procedures ? ?Dolly Rias RPh ?11/13/2021, 11:45 PM ? ?

## 2021-11-13 NOTE — Care Management Important Message (Signed)
Important Message ? ?Patient Details IM Letter placed in Patients room ?Name: Phillip Jordan ?MRN: [id number] ?Date of Birth: 06-03-1930 ? ? ?Medicare Important Message Given:  Yes ? ? ? ? ?Kerin Salen ?11/13/2021, 2:05 PM ?

## 2021-11-13 NOTE — TOC Progression Note (Signed)
Transition of Care (TOC) - Progression Note  ? ? ?Patient Details  ?Name: Phillip Jordan ?MRN: [id number] ?Date of Birth: May 05, 1930 ? ?Transition of Care (TOC) CM/SW Contact  ?Tawanna Cooler, RN ?Phone Number: ?11/13/2021, 1:03 PM ? ?Clinical Narrative:    ? ?Patient currently on 6L O2.  Also on hep gtt and will possibly need a heart cath.   ?TOC CM following.  ? ? ?Expected Discharge Plan: Home/Self Care ?Barriers to Discharge: Continued Medical Work up ? ?Expected Discharge Plan and Services ?Expected Discharge Plan: Home/Self Care ?  ?   ?Living arrangements for the past 2 months: [place] ?                ?  ?

## 2021-11-13 NOTE — Evaluation (Signed)
Physical Therapy Evaluation ?Patient Details ?Name: Phillip Jordan ?MRN: [id number] ?DOB: Oct 14, 1929 ?Today's Date: 11/13/2021 ? ?History of Present Illness ? Pt is an 88 year old male admitted with pneumonia due to COVID-19 virus, acute hypoxemic respiratory failure secondary to COVID-19. Initially needed BiPAP due to respiratory distress. Also found with non-STEMI. PMH significant for coronary artery disease, permanent pacemaker, AAA, type 2 diabetes on insulin, COPD. ?  ?Clinical Impression ? Pt is an 88 y.o. male with above HPI resulting in the deficits listed below (see PT Problem List). Pt is independent at baseline with mobility (SPC use) and ADLs. Pt performed sit to stand transfers with supervision for safety and cues for safe hand placement. Pt ambulated total of ~64f with MIN guard and use of RW. Pt on 6L [place] upon entry with O2 sats in upper 90's. Desat to low of 75% with mobility requiring titration up to 8L. Recoved back to 99% with seated rest and cues for deep breathing, left on 6L at end of session with O2 sat 99-100%. Pt will have assist from his wife upon d/c. Pt will benefit from skilled PT to maximize functional mobility to increase independence.  ?   ?   ? ?Recommendations for follow up therapy are one component of a multi-disciplinary discharge planning process, led by the attending physician.  Recommendations may be updated based on patient status, additional functional criteria and insurance authorization. ? ?Follow Up Recommendations Home health PT ? ?  ?Assistance Recommended at Discharge Frequent or constant Supervision/Assistance  ?Patient can return home with the following ? A little help with walking and/or transfers;A little help with bathing/dressing/bathroom;Assist for transportation;Help with stairs or ramp for entrance;Assistance with cooking/housework ? ?  ?Equipment Recommendations Rolling walker (2 wheels)  ?Recommendations for Other Services ?    ?  ?Functional Status Assessment  Patient has had a recent decline in their functional status and demonstrates the ability to make significant improvements in function in a reasonable and predictable amount of time.  ? ?  ?Precautions / Restrictions Precautions ?Precautions: Fall ?Restrictions ?Weight Bearing Restrictions: No  ? ?  ? ?Mobility ? Bed Mobility ?  ?  ?  ?  ?  ?  ?  ?General bed mobility comments: OOB in chair upon entry ?  ? ?Transfers ?Overall transfer level: Needs assistance ?Equipment used: Rolling walker (2 wheels) ?Transfers: Sit to/from Stand ?Sit to Stand: Supervision ?  ?  ?  ?  ?  ?General transfer comment: cues for hand placement ?  ? ?Ambulation/Gait ?Ambulation/Gait assistance: Min guard ?Gait Distance (Feet): 40 Feet ?Assistive device: Rolling walker (2 wheels) ?Gait Pattern/deviations: Step-to pattern, Decreased stride length, Trunk flexed ?Gait velocity: decr ?  ?  ?General Gait Details: cues to maintain close proximity to RW. Pt on 6L [place] upon entry with O2 sats in upper 90's. Desat to low of 75% with mobility requiring titration up to 8L. Recoved back to 99% with seated rest and cues for deep breathing, left on 6L at end of session with O2 sat 99-100%. ? ?Stairs ?  ?  ?  ?  ?  ? ?Wheelchair Mobility ?  ? ?Modified Rankin (Stroke Patients Only) ?  ? ?  ? ?Balance Overall balance assessment: Needs assistance ?Sitting-balance support: Feet supported ?Sitting balance-Leahy Scale: Good ?  ?  ?Standing balance support: Bilateral upper extremity supported, During functional activity, Reliant on assistive device for balance ?Standing balance-Leahy Scale: Poor ?  ?  ?  ?  ?  ?  ?  ?  ?  ?  ?  ?  ?   ? ? ? ?  Pertinent Vitals/Pain Pain Assessment ?Pain Assessment: No/denies pain  ? ? ?Home Living Family/patient expects to be discharged to:: Private residence ?Living Arrangements: Spouse/significant other ?Available Help at Discharge: Family ?Type of Home: House (town home) ?Home Access: Stairs to enter ?Entrance Stairs-Rails:  None ?Entrance Stairs-Number of Steps: 1 ?  ?Home Layout: One level (1 step to get to den) ?Home Equipment: Standard Walker;Cane - single point ?   ?  ?Prior Function Prior Level of Function : Independent/Modified Independent ?  ?  ?  ?  ?  ?  ?Mobility Comments: SPC baseline. denies falls ?ADLs Comments: sometimes assist with buttoned shirts. history of neuropathy B feet/hands ?  ? ? ?Hand Dominance  ? Dominant Hand: Right ? ?  ?Extremity/Trunk Assessment  ? Upper Extremity Assessment ?Upper Extremity Assessment: Overall WFL for tasks assessed ?  ? ?Lower Extremity Assessment ?Lower Extremity Assessment: Generalized weakness ?  ? ?Cervical / Trunk Assessment ?Cervical / Trunk Assessment: Normal  ?Communication  ? Communication: No difficulties  ?Cognition Arousal/Alertness: Awake/alert ?Behavior During Therapy: [facility] for tasks assessed/performed ?Overall Cognitive Status: Within Functional Limits for tasks assessed ?  ?  ?  ?  ?  ?  ?  ?  ?  ?  ?  ?  ?  ?  ?  ?  ?  ?  ?  ? ?  ?General Comments   ? ?  ?Exercises    ? ?Assessment/Plan  ?  ?PT Assessment Patient needs continued PT services  ?PT Problem List Decreased strength;Decreased activity tolerance;Decreased balance;Decreased mobility;Decreased knowledge of use of DME;Cardiopulmonary status limiting activity ? ?   ?  ?PT Treatment Interventions DME instruction;Gait training;Stair training;Functional mobility training;Therapeutic activities;Therapeutic exercise;Balance training;Patient/family education   ? ?PT Goals (Current goals can be found in the Care Plan section)  ?Acute Rehab PT Goals ?Patient Stated Goal: Get strength/independence back ?PT Goal Formulation: With patient/family ?Time For Goal Achievement: 11/27/21 ?Potential to Achieve Goals: Good ? ?  ?Frequency Min 3X/week ?  ? ? ?Co-evaluation   ?  ?  ?  ?  ? ? ?  ?AM-PAC PT "6 Clicks" Mobility  ?Outcome Measure Help needed turning from your back to your side while in a flat bed without using bedrails?: A  Little ?Help needed moving from lying on your back to sitting on the side of a flat bed without using bedrails?: A Little ?Help needed moving to and from a bed to a chair (including a wheelchair)?: A Little ?Help needed standing up from a chair using your arms (e.g., wheelchair or bedside chair)?: A Little ?Help needed to walk in hospital room?: A Little ?Help needed climbing 3-5 steps with a railing? : A Lot ?6 Click Score: 17 ? ?  ?End of Session Equipment Utilized During Treatment: Gait belt;Oxygen ?Activity Tolerance: Patient tolerated treatment well;Treatment limited secondary to medical complications (Comment) (limited due to O2 desats) ?Patient left: in chair;with call bell/phone within reach;with family/visitor present ?Nurse Communication: Mobility status;Other (comment) (O2 sats) ?PT Visit Diagnosis: Unsteadiness on feet (R26.81);Muscle weakness (generalized) (M62.81) ?  ? ?Time: 1540-0867 ?PT Time Calculation (min) (ACUTE ONLY): 47 min ? ? ?Charges:   PT Evaluation ?$PT Eval Low Complexity: 1 Low ?PT Treatments ?$Therapeutic Activity: 23-37 mins ?  ?   ? ? ?Festus Barren., PT, DPT  ?Acute Rehabilitation Services  ?Office [phone] ? ?11/13/2021, 4:32 PM ? ?

## 2021-11-13 NOTE — Progress Notes (Signed)
? ?Progress Note ? ?Patient Name: Phillip Jordan ?Date of Encounter: 11/13/2021 ? ?[facility] Cardiologist: Jenkins Rouge, MD  ? ?Subjective  ? ?Continues to improve from a respiratory standpoint.  Seen with patient's wife and son at the bedside.  Conversation was video recorded by son. ? ?Inpatient Medications  ?  ?Scheduled Meds: ? arformoterol  15 mcg Nebulization BID  ? aspirin EC  81 mg Oral QPM  ? chlorhexidine  15 mL Mouth Rinse BID  ? Chlorhexidine Gluconate Cloth  6 each Topical Daily  ? insulin aspart  0-9 Units Subcutaneous TID WC  ? insulin detemir  10 Units Subcutaneous Daily  ? isosorbide mononitrate  30 mg Oral Daily  ? levalbuterol  0.63 mg Nebulization Q6H WA  ? mouth rinse  15 mL Mouth Rinse q12n4p  ? metoprolol tartrate  12.5 mg Oral BID  ? nitroGLYCERIN  0.5 inch Topical Q8H  ? pravastatin  40 mg Oral q1800  ? predniSONE  50 mg Oral Daily  ? revefenacin  175 mcg Nebulization Daily  ? ?Continuous Infusions: ? cefTRIAXone (ROCEPHIN)  IV 2 g (11/13/21 0701)  ? heparin 750 Units/hr (11/13/21 1309)  ? lactated ringers 75 mL/hr at 11/13/21 1733  ? ?PRN Meds: ?acetaminophen **OR** acetaminophen, chlorpheniramine-HYDROcodone, guaiFENesin-dextromethorphan, metoprolol tartrate, nitroGLYCERIN, prochlorperazine  ? ?Vital Signs  ?  ?Vitals:  ? 11/13/21 1118 11/13/21 1348 11/13/21 1639 11/13/21 1648  ?BP: 126/65  134/69   ?Pulse: 87  84   ?Resp: 18  16   ?Temp: 98.4 ?F (36.9 ?C)   97.7 ?F (36.5 ?C)  ?TempSrc: Oral   Oral  ?SpO2: 100% 99% 100%   ?Weight:      ?Height:      ? ? ?Intake/Output Summary (Last 24 hours) at 11/13/2021 2053 ?Last data filed at 11/13/2021 1800 ?Gross per 24 hour  ?Intake 2807.67 ml  ?Output 1900 ml  ?Net 907.67 ml  ? ? ?  11/13/2021  ?  6:44 AM 11/15/2021  ?  8:59 PM 10/27/2021  ? 10:53 AM  ?Last 3 Weights  ?Weight (lbs) 183 lb 13.8 oz 167 lb 15.9 oz 168 lb 2 oz  ?Weight (kg) 83.4 kg 76.2 kg 76.261 kg  ?   ? ?Telemetry  ?  ?paced - Personally Reviewed ? ?ECG  ?  ?asvp - Personally  Reviewed ? ?Physical Exam  ? ?GEN: No acute distress.   ?Neck: No JVD ?Cardiac: RRR, no murmurs, rubs, or gallops.  ?Respiratory: Clear to auscultation bilaterally. ?GI: Soft, nontender, non-distended  ?MS: No edema; No deformity. ?Neuro:  Nonfocal  ?Psych: Normal affect  ? ?Labs  ?  ?High Sensitivity Troponin:   ?Recent Labs  ?Lab 11/09/21 ?1525 11/09/21 ?1809 11/10/21 ?0423 11/11/21 ?0740 11/11/21 ?0920  ?TROPONINIHS 1,601* 2,823* 0,347* 2,024* 2,095*  ?   ?Chemistry ?Recent Labs  ?Lab 11/11/21 ?4259 11/12/21 ?5638 11/13/21 ?0110  ?NA 129* 136 134*  ?K 4.1 4.5 3.5  ?CL 97* 101 99  ?CO2 '23 30 30  '$ ?GLUCOSE 267* 259* 177*  ?BUN 37* 34* 37*  ?CREATININE 0.87 0.84 0.76  ?CALCIUM 8.4* 8.6* 8.3*  ?PROT 6.1* 5.3* 5.2*  ?ALBUMIN 3.1* 2.8* 2.8*  ?AST 51* 29 34  ?ALT 31 28 37  ?ALKPHOS 62 47 47  ?BILITOT 0.2* 0.2* 0.4  ?GFRNONAA >60 >60 >60  ?ANIONGAP '9 5 5  '$ ?  ?Lipids No results for input(s): CHOL, TRIG, HDL, LABVLDL, LDLCALC, CHOLHDL in the last 168 hours.  ?Hematology ?Recent Labs  ?Lab 11/11/21 ?7564 11/12/21 ?3329  11/13/21 ?0110  ?WBC 22.8* 16.7* 17.7*  ?RBC 4.34 3.87* 3.55*  ?HGB 13.4 11.7* 10.9*  ?HCT 38.8* 35.6* 32.5*  ?MCV 89.4 92.0 91.5  ?MCH 30.9 30.2 30.7  ?MCHC 34.5 32.9 33.5  ?RDW 13.3 13.6 13.3  ?PLT 302 273 264  ? ?Thyroid No results for input(s): TSH, FREET4 in the last 168 hours.  ?BNP ?Recent Labs  ?Lab 11/01/2021 ?2100  ?BNP 695.6*  ?  ?DDimer  ?Recent Labs  ?Lab 11/10/21 ?6767 11/11/21 ?2094 11/12/21 ?0452  ?DDIMER 1.27* 0.96* 0.68*  ?  ? ?Radiology  ?  ?No results found. ? ?DG CHEST PORT 1 VIEW ? ?Result Date: 11/11/2021 ?CLINICAL DATA:  88 year old male with increasing shortness of breath. EXAM: PORTABLE CHEST 1 VIEW COMPARISON:  Chest x-ray 11/09/2021. FINDINGS: Left-sided pacemaker device in place with lead tips projecting over the expected location of the right atrium and right ventricle. Lung volumes are normal. Patchy areas of interstitial prominence an ill-defined airspace consolidation are noted  throughout the mid to lower lungs bilaterally, worsened compared to the recent prior study. Small bilateral pleural effusions. No pneumothorax. No evidence of pulmonary edema. Heart size is normal. Upper mediastinal contours are within normal limits. Atherosclerotic calcifications are noted thoracic aorta. IMPRESSION: 1. Worsening aeration throughout the mid to lower lungs bilaterally, suggestive of multilobar bilateral pneumonia, with small bilateral pleural effusions, as above. 2. Aortic atherosclerosis. Electronically Signed   By: Vinnie Langton M.D.   On: 11/11/2021 05:12  ? ?DG Chest Port 1 View ? ?Result Date: 11/09/2021 ?CLINICAL DATA:  Shortness of breath and dyspnea. EXAM: PORTABLE CHEST 1 VIEW COMPARISON:  Chest x-ray 12/27/2020 FINDINGS: Left-sided pacemaker is again seen. There are increasing patchy airspace opacities in the left lung base. There is no pleural effusion or pneumothorax. The cardiomediastinal silhouette is within normal limits. No acute fractures. IMPRESSION: 1. Increasing left lower lobe airspace disease worrisome for pneumonia. Follow-up chest x-ray recommended in 4 6 weeks to confirm resolution. Electronically Signed   By: Ronney Asters M.D.   On: 10/30/2021 21:34  ? ?ECHOCARDIOGRAM COMPLETE ? ?Result Date: 11/09/2021 ?   ECHOCARDIOGRAM REPORT   Patient Name:   Phillip Jordan Date of Exam: 11/09/2021 Medical Rec #:  [id number]        Height:       69.0 in Accession #:    [id number]       Weight:       168.0 lb Date of Birth:  04/02/1930        BSA:          1.918 m? Patient Age:    88 years         BP:           125/55 mmHg Patient Gender: M                HR:           111 bpm. Exam Location:  Inpatient Procedure: 2D Echo, Cardiac Doppler and Color Doppler Indications:    I33.9 SBE  History:        Patient has prior history of Echocardiogram examinations, most                 recent 08/21/2020. CAD, COPD; Risk Factors:Diabetes and                 Dyslipidemia.  Sonographer:    Beryle Beams Referring Phys: [id number] Rise Patience  Sonographer Comments: Image acquisition challenging due to respiratory  motion. IMPRESSIONS  1. Left ventricular ejection fraction, by estimation, is 40 to 45%. The left ventricle has mildly decreased function. The left ventricle demonstrates global hypokinesis. Left ventricular diastolic parameters are consistent with Grade I diastolic dysfunction (impaired relaxation).  2. Right ventricular systolic function is normal. The right ventricular size is mildly enlarged.  3. The mitral valve is grossly normal. No evidence of mitral valve regurgitation.  4. The aortic valve was not well visualized. Aortic valve regurgitation is not visualized.  5. The inferior vena cava is normal in size with greater than 50% respiratory variability, suggesting right atrial pressure of 3 mmHg. Conclusion(s)/Recommendation(s): EF more reduced from prior 08/21/2020. FINDINGS  Left Ventricle: Left ventricular ejection fraction, by estimation, is 40 to 45%. The left ventricle has mildly decreased function. The left ventricle demonstrates global hypokinesis. The left ventricular internal cavity size was normal in size. There is  no left ventricular hypertrophy. Abnormal (paradoxical) septal motion, consistent with RV pacemaker. Left ventricular diastolic parameters are consistent with Grade I diastolic dysfunction (impaired relaxation). Right Ventricle: The right ventricular size is mildly enlarged. No increase in right ventricular wall thickness. Right ventricular systolic function is normal. Left Atrium: Left atrial size was normal in size. Right Atrium: Right atrial size was normal in size. Pericardium: There is no evidence of pericardial effusion. Mitral Valve: The mitral valve is grossly normal. No evidence of mitral valve regurgitation. Tricuspid Valve: The tricuspid valve is grossly normal. Tricuspid valve regurgitation is not demonstrated. Aortic Valve: The aortic valve was not well  visualized. Aortic valve regurgitation is not visualized. Aortic valve mean gradient measures 1.0 mmHg. Aortic valve peak gradient measures 2.9 mmHg. Aortic valve area, by VTI measures 2.24 cm?. Pulmonic Valve: Terie Purser

## 2021-11-13 NOTE — Progress Notes (Signed)
ANTICOAGULATION CONSULT NOTE ? ?Pharmacy Consult for IV heparin ?Indication: chest pain/ACS ? ?No Known Allergies ? ?Patient Measurements: ?Height: '5\' 9"'$  (175.3 cm) ?Weight: 76.2 kg (167 lb 15.9 oz) ?IBW/kg (Calculated) : 70.7 ?Heparin Dosing Weight: TBW ? ?Vital Signs: ?Temp: 97.8 ?F (36.6 ?C) (03/23 0047) ?Temp Source: Oral (03/23 0047) ?BP: 127/60 (03/23 0047) ?Pulse Rate: 72 (03/23 0047) ? ?Labs: ?Recent Labs  ?  11/10/21 ?0423 11/10/21 ?1053 11/11/21 ?0301 11/11/21 ?0740 11/11/21 ?0920 11/11/21 ?3143 11/12/21 ?8887 11/12/21 ?1412 11/13/21 ?0110  ?HGB  --   --  13.4  --   --   --  11.7*  --  10.9*  ?HCT  --   --  38.8*  --   --   --  35.6*  --  32.5*  ?PLT  --   --  302  --   --   --  273  --  264  ?APTT  --    < > 49*  --   --    < > 120* 138* 101*  ?HEPARINUNFRC  --   --  >1.10*  --   --   --  1.06* >1.10* 0.69  ?CREATININE  --    < > 0.87  --   --   --  0.84  --  0.76  ?TROPONINIHS 5,797*  --   --  2,024* 2,095*  --   --   --   --   ? < > = values in this interval not displayed.  ? ? ? ?Estimated Creatinine Clearance: 62.6 mL/min (by C-G formula based on SCr of 0.76 mg/dL). ? ?Assessment: ?48 yoM w/ PMH CAD s/p PCI '97, DM2, COPD, AAA with Hx distal dissection, admitted for COVID PNA. Noted to have some chest pain and rising troponins, and Pharmacy consulted to dose heparin for ACS. ? ?Baseline INR slightly elevated d/t Eliquis, aPTT not done ?Prior anticoagulation: Eliquis 5 mg PO bid, LD 3/19 at 0921 ? ? ?Today, 11/13/2021: ?aPTT = 101 therapeutic on 800 units/hr ?HL 0.69 also therapeutic ?CBC stable ?No bleeding per RN ? ? ?Goal of Therapy: ?Heparin level 0.3-0.7 units/ml ?aPTT 66-102 sec ?Monitor platelets by anticoagulation protocol: Yes ? ?Plan: ?Continue heparin drip at 800 units/hr ?Check aPTT 8 hours  ?Daily CBC, aPTT & daily heparin level once stable ?Monitor for signs of bleeding or thrombosis ? ?Dolly Rias RPh ?11/13/2021, 2:33 AM ? ?

## 2021-11-14 ENCOUNTER — Inpatient Hospital Stay (HOSPITAL_COMMUNITY): Payer: HMO

## 2021-11-14 DIAGNOSIS — I5189 Other ill-defined heart diseases: Secondary | ICD-10-CM

## 2021-11-14 DIAGNOSIS — J9601 Acute respiratory failure with hypoxia: Secondary | ICD-10-CM | POA: Diagnosis not present

## 2021-11-14 LAB — CBC WITH DIFFERENTIAL/PLATELET
Abs Immature Granulocytes: 0.75 10*3/uL — ABNORMAL HIGH (ref 0.00–0.07)
Basophils Absolute: 0 10*3/uL (ref 0.0–0.1)
Basophils Relative: 0 %
Eosinophils Absolute: 0.1 10*3/uL (ref 0.0–0.5)
Eosinophils Relative: 1 %
HCT: 23.1 % — ABNORMAL LOW (ref 39.0–52.0)
Hemoglobin: 8 g/dL — ABNORMAL LOW (ref 13.0–17.0)
Immature Granulocytes: 4 %
Lymphocytes Relative: 16 %
Lymphs Abs: 2.8 10*3/uL (ref 0.7–4.0)
MCH: 31 pg (ref 26.0–34.0)
MCHC: 34.6 g/dL (ref 30.0–36.0)
MCV: 89.5 fL (ref 80.0–100.0)
Monocytes Absolute: 2 10*3/uL — ABNORMAL HIGH (ref 0.1–1.0)
Monocytes Relative: 11 %
Neutro Abs: 11.7 10*3/uL — ABNORMAL HIGH (ref 1.7–7.7)
Neutrophils Relative %: 68 %
Platelets: 272 10*3/uL (ref 150–400)
RBC: 2.58 MIL/uL — ABNORMAL LOW (ref 4.22–5.81)
RDW: 13.1 % (ref 11.5–15.5)
WBC: 17.3 10*3/uL — ABNORMAL HIGH (ref 4.0–10.5)
nRBC: 0.5 % — ABNORMAL HIGH (ref 0.0–0.2)

## 2021-11-14 LAB — ECHOCARDIOGRAM LIMITED
Calc EF: 41.1 %
Height: 69 in
S' Lateral: 3.4 cm
Single Plane A2C EF: 39.3 %
Single Plane A4C EF: 39.7 %
Weight: 2941.82 oz

## 2021-11-14 LAB — COMPREHENSIVE METABOLIC PANEL
ALT: 59 U/L — ABNORMAL HIGH (ref 0–44)
AST: 39 U/L (ref 15–41)
Albumin: 2.5 g/dL — ABNORMAL LOW (ref 3.5–5.0)
Alkaline Phosphatase: 41 U/L (ref 38–126)
Anion gap: 6 (ref 5–15)
BUN: 36 mg/dL — ABNORMAL HIGH (ref 8–23)
CO2: 29 mmol/L (ref 22–32)
Calcium: 7.6 mg/dL — ABNORMAL LOW (ref 8.9–10.3)
Chloride: 93 mmol/L — ABNORMAL LOW (ref 98–111)
Creatinine, Ser: 0.63 mg/dL (ref 0.61–1.24)
GFR, Estimated: >60 mL/min (ref >60)
Glucose, Bld: 122 mg/dL — ABNORMAL HIGH (ref 70–99)
Potassium: 3.3 mmol/L — ABNORMAL LOW (ref 3.5–5.1)
Sodium: 128 mmol/L — ABNORMAL LOW (ref 135–145)
Total Bilirubin: 0.4 mg/dL (ref 0.3–1.2)
Total Protein: 4.5 g/dL — ABNORMAL LOW (ref 6.5–8.1)

## 2021-11-14 LAB — CULTURE, BLOOD (ROUTINE X 2)
Culture: NO GROWTH
Culture: NO GROWTH
Special Requests: ADEQUATE
Special Requests: ADEQUATE

## 2021-11-14 LAB — HEPARIN LEVEL (UNFRACTIONATED): Heparin Unfractionated: 0.18 IU/mL — ABNORMAL LOW (ref 0.30–0.70)

## 2021-11-14 LAB — GLUCOSE, CAPILLARY
Glucose-Capillary: 119 mg/dL — ABNORMAL HIGH (ref 70–99)
Glucose-Capillary: 192 mg/dL — ABNORMAL HIGH (ref 70–99)
Glucose-Capillary: 277 mg/dL — ABNORMAL HIGH (ref 70–99)

## 2021-11-14 MED ORDER — APIXABAN 5 MG PO TABS
5.0000 mg | ORAL_TABLET | Freq: Two times a day (BID) | ORAL | Status: DC
  Administered 2021-11-14 – 2021-11-16 (×4): 5 mg via ORAL
  Filled 2021-11-14 (×4): qty 1

## 2021-11-14 MED ORDER — PERFLUTREN LIPID MICROSPHERE
1.0000 mL | INTRAVENOUS | Status: AC | PRN
  Administered 2021-11-14: 2 mL via INTRAVENOUS
  Filled 2021-11-14: qty 10

## 2021-11-14 MED ORDER — HEPARIN BOLUS VIA INFUSION
1000.0000 [IU] | INTRAVENOUS | Status: AC
  Administered 2021-11-14: 1000 [IU] via INTRAVENOUS
  Filled 2021-11-14: qty 1000

## 2021-11-14 MED ORDER — POTASSIUM CHLORIDE CRYS ER 20 MEQ PO TBCR
20.0000 meq | EXTENDED_RELEASE_TABLET | Freq: Two times a day (BID) | ORAL | Status: AC
Start: 2021-11-14 — End: 2021-11-15
  Administered 2021-11-14 – 2021-11-15 (×4): 20 meq via ORAL
  Filled 2021-11-14 (×4): qty 1

## 2021-11-14 MED ORDER — HEPARIN (PORCINE) 25000 UT/250ML-% IV SOLN
850.0000 [IU]/h | INTRAVENOUS | Status: DC
  Administered 2021-11-14 (×2): 850 [IU]/h via INTRAVENOUS
  Filled 2021-11-14 (×3): qty 250

## 2021-11-14 NOTE — Progress Notes (Signed)
Physical Therapy Treatment ?Patient Details ?Name: Phillip Jordan ?MRN: [id number] ?DOB: 10/15/1929 ?Today's Date: 11/14/2021 ? ? ?History of Present Illness Pt is an 88 year old male admitted with pneumonia due to COVID-19 virus, acute hypoxemic respiratory failure secondary to COVID-19. Initially needed BiPAP due to respiratory distress. Also found with non-STEMI. PMH significant for coronary artery disease, permanent pacemaker, AAA, type 2 diabetes on insulin, COPD. ? ?  ?PT Comments  ? ? General Comments: AxO x 3 feels "really bad" nursing reports poor sleep last nigh with a lot coughing. ?Pt was unable to amb in hallway today.  General transfer comment: increased assist this session with MAX c/o feeling "poorly" and c/o dizziness.  BP taken standing 147/96 with HR 124.  Pt remained on 6 lts nasal sats 96%.  General Gait Details: pt only able to take a few steps from bed to recliner due to increased c/o feeling "poorly".  "I got to sit down". Pt also appears more swollen throughout (B UE's + B LE's)   ?Recommendations for follow up therapy are one component of a multi-disciplinary discharge planning process, led by the attending physician.  Recommendations may be updated based on patient status, additional functional criteria and insurance authorization. ? ?Follow Up Recommendations ? Home health PT ?  ?  ?Assistance Recommended at Discharge Frequent or constant Supervision/Assistance  ?Patient can return home with the following A little help with walking and/or transfers;A little help with bathing/dressing/bathroom;Assist for transportation;Help with stairs or ramp for entrance;Assistance with cooking/housework ?  ?Equipment Recommendations ? Rolling walker (2 wheels)  ?  ?Recommendations for Other Services   ? ? ?  ?Precautions / Restrictions Precautions ?Precautions: Fall ?Precaution Comments: monitor sats ?Restrictions ?Weight Bearing Restrictions: No  ?  ? ?Mobility ? Bed Mobility ?Overal bed mobility: Needs  Assistance ?Bed Mobility: Supine to Sit ?  ?  ?Supine to sit: Min guard, Min assist ?  ?  ?General bed mobility comments: light assist for upper body, increased time and caution lines ?  ? ?Transfers ?Overall transfer level: Needs assistance ?Equipment used: Rolling walker (2 wheels) ?Transfers: Sit to/from Stand ?Sit to Stand: Min guard, Min assist ?  ?  ?  ?  ?  ?General transfer comment: increased assist this session with MAX c/o feeling "poorly" and c/o dizziness.  BP taken standing 147/96 with HR 124.  Pt remained on 6 lts nasal sats 96%. ?  ? ?Ambulation/Gait ?Ambulation/Gait assistance: Min guard, Min assist ?Gait Distance (Feet): 2 Feet ?Assistive device: Rolling walker (2 wheels) ?Gait Pattern/deviations: Step-to pattern, Decreased stride length, Trunk flexed ?  ?  ?  ?General Gait Details: pt only able to take a few steps from bed to recliner due to increased c/o feeling "poorly".  "I got to sit down". ? ? ?Stairs ?  ?  ?  ?  ?  ? ? ?Wheelchair Mobility ?  ? ?Modified Rankin (Stroke Patients Only) ?  ? ? ?  ?Balance   ?  ?  ?  ?  ?  ?  ?  ?  ?  ?  ?  ?  ?  ?  ?  ?  ?  ?  ?  ? ?  ?Cognition Arousal/Alertness: Awake/alert ?Behavior During Therapy: [facility] for tasks assessed/performed ?Overall Cognitive Status: Within Functional Limits for tasks assessed ?  ?  ?  ?  ?  ?  ?  ?  ?  ?  ?  ?  ?  ?  ?  ?  ?  General Comments: AxO x 3 feels "really bad" nursing reports poor sleep last nigh with a lot coughing ?  ?  ? ?  ?Exercises   ? ?  ?General Comments   ?  ?  ? ?Pertinent Vitals/Pain Pain Assessment ?Pain Assessment: No/denies pain  ? ? ?Home Living   ?  ?  ?  ?  ?  ?  ?  ?  ?  ?   ?  ?Prior Function    ?  ?  ?   ? ?PT Goals (current goals can now be found in the care plan section) Progress towards PT goals: Progressing toward goals ? ?  ?Frequency ? ? ? Min 3X/week ? ? ? ?  ?PT Plan Current plan remains appropriate  ? ? ?Co-evaluation   ?  ?  ?  ?  ? ?  ?AM-PAC PT "6 Clicks" Mobility   ?Outcome Measure ? Help  needed turning from your back to your side while in a flat bed without using bedrails?: A Little ?Help needed moving from lying on your back to sitting on the side of a flat bed without using bedrails?: A Little ?Help needed moving to and from a bed to a chair (including a wheelchair)?: A Little ?Help needed standing up from a chair using your arms (e.g., wheelchair or bedside chair)?: A Little ?Help needed to walk in hospital room?: A Little ?Help needed climbing 3-5 steps with a railing? : A Little ?6 Click Score: 18 ? ?  ?End of Session Equipment Utilized During Treatment: Gait belt;Oxygen ?Activity Tolerance: Patient tolerated treatment well;Treatment limited secondary to medical complications (Comment) ?Patient left: in chair;with call bell/phone within reach;with family/visitor present ?Nurse Communication: Mobility status;Other (comment) ?PT Visit Diagnosis: Unsteadiness on feet (R26.81);Muscle weakness (generalized) (M62.81) ?  ? ? ?Time: 9629-5284 ?PT Time Calculation (min) (ACUTE ONLY): 25 min ? ?Charges:  $Gait Training: 8-22 mins ?$Therapeutic Activity: 8-22 mins          ?          ? ?Rica Koyanagi  PTA ?Acute  Rehabilitation Services ?Pager      [phone] ?Office      [phone] ? ? ?

## 2021-11-14 NOTE — Progress Notes (Signed)
ANTICOAGULATION CONSULT NOTE ? ?Pharmacy Consult for IV heparin ?Indication: chest pain/ACS (apixaban for afib on hold) ? ?No Known Allergies ? ?Patient Measurements: ?Height: '5\' 9"'$  (175.3 cm) ?Weight: 83.4 kg (183 lb 13.8 oz) ?IBW/kg (Calculated) : 70.7 ?Heparin Dosing Weight: TBW ? ?Vital Signs: ?Temp: 97.8 ?F (36.6 ?C) (03/24 0544) ?Temp Source: Oral (03/24 0544) ?BP: 127/66 (03/24 5573) ?Pulse Rate: 83 (03/24 0838) ? ?Labs: ?Recent Labs  ?  11/12/21 ?0452 11/12/21 ?1412 11/13/21 ?0110 11/13/21 ?0829 11/13/21 ?2143 11/14/21 ?0756  ?HGB 11.7*  --  10.9*  --   --  8.0*  ?HCT 35.6*  --  32.5*  --   --  23.1*  ?PLT 273  --  264  --   --  272  ?APTT 120* 138* 101* 69*  --   --   ?HEPARINUNFRC 1.06* >1.10* 0.69 0.69 0.28* 0.18*  ?CREATININE 0.84  --  0.76  --   --  0.63  ? ? ? ?Estimated Creatinine Clearance: 62.6 mL/min (by C-G formula based on SCr of 0.63 mg/dL). ? ?Assessment: ?19 yoM w/ PMH significant for CAD s/p PCI '97, T2DM, COPD, AAA with Hx distal dissection, afib (on apixaban PTA). Pt admitted with SOB. COVID+ on 3/16 prior to admission. Pt had chest pain and rising troponin - Troponin peaked at 6988 - pharmacy consulted to dose heparin for ACS/NSTEMI.  ? ?Cardiology following: Planning for possible cardiac cath if/when patient able to lie flat.  ? ?Baseline INR slightly elevated d/t Eliquis, aPTT not done ?Prior anticoagulation: Eliquis 5 mg PO BID, LD 3/19 at 0921 ? ?Today, 11/14/2021: ?HL = 0.18 - sub-therapeutic on heparin infusion of 800 units/hr (but previously therapeutic on heparin 800 units/hr) ?CBC: Hgb (8.0) low and trending down; Plt WNL ?No bleeding or line issues per nurse other than 3/23-he lost the IV around 6pm, and bled some, but nurse switched it to another IV access right away ? ?Goal of Therapy: ?Heparin level 0.3-0.7 units/ml ?aPTT 66-102 sec ?Monitor platelets by anticoagulation protocol: Yes ? ?Plan: ?Heparin 1000 unit IV bolus then increase heparin infusion slightly to 850  units/hr ?Check 8 hour HL ?CBC, HL daily while on heparin infusion ?Follow for ability to transition back to [place] after procedures ? ?Royetta Asal, PharmD, BCPS ?Clinical Pharmacist ?[place] ?Please utilize Amion for appropriate phone number to reach the unit pharmacist ([place]) ?11/14/2021 9:41 AM ? ?

## 2021-11-14 NOTE — Progress Notes (Signed)
? ?Progress Note ? ?Due to the COVID-19 pandemic, this visit was completed with telemedicine (audio/video) technology to reduce patient and provider exposure as well as to preserve personal protective equipment.  ? ?Patient Name: Phillip Jordan ?Date of Encounter: 11/14/2021 ? ?Primary Cardiologist: Jenkins Rouge, MD  ? ?Subjective  ? ?Improving per family on phone ? ?Inpatient Medications  ?  ?Scheduled Meds: ? arformoterol  15 mcg Nebulization BID  ? aspirin EC  81 mg Oral QPM  ? chlorhexidine  15 mL Mouth Rinse BID  ? Chlorhexidine Gluconate Cloth  6 each Topical Daily  ? insulin aspart  0-9 Units Subcutaneous TID WC  ? insulin detemir  10 Units Subcutaneous Daily  ? isosorbide mononitrate  30 mg Oral Daily  ? levalbuterol  0.63 mg Nebulization TID  ? mouth rinse  15 mL Mouth Rinse q12n4p  ? metoprolol tartrate  12.5 mg Oral BID  ? nitroGLYCERIN  0.5 inch Topical Q8H  ? potassium chloride  20 mEq Oral BID  ? pravastatin  40 mg Oral q1800  ? predniSONE  50 mg Oral Daily  ? revefenacin  175 mcg Nebulization Daily  ? ?Continuous Infusions: ? cefTRIAXone (ROCEPHIN)  IV 2 g (11/14/21 0606)  ? heparin 850 Units/hr (11/14/21 0953)  ? lactated ringers 75 mL/hr at 11/14/21 1306  ? ?PRN Meds: ?acetaminophen **OR** acetaminophen, chlorpheniramine-HYDROcodone, guaiFENesin-dextromethorphan, metoprolol tartrate, nitroGLYCERIN, prochlorperazine  ? ?Vital Signs  ?  ?Vitals:  ? 11/14/21 0611 11/14/21 0838 11/14/21 1051 11/14/21 1317  ?BP: 137/69 127/66  124/69  ?Pulse:  83  73  ?Resp: '16 18  18  '$ ?Temp:    97.9 ?F (36.6 ?C)  ?TempSrc:    Oral  ?SpO2:  100% 100% 100%  ?Weight:      ?Height:      ? ? ?Intake/Output Summary (Last 24 hours) at 11/14/2021 1621 ?Last data filed at 11/13/2021 2300 ?Gross per 24 hour  ?Intake 1019.94 ml  ?Output 1700 ml  ?Net -680.06 ml  ? ? ?  11/13/2021  ?  6:44 AM 10/28/2021  ?  8:59 PM 10/27/2021  ? 10:53 AM  ?Last 3 Weights  ?Weight (lbs) 183 lb 13.8 oz 167 lb 15.9 oz 168 lb 2 oz  ?Weight (kg) 83.4 kg  76.2 kg 76.261 kg  ?   ? ?Telemetry  ?  ?APVP and ASVP - Personally Reviewed ? ?ECG  ?  ?No new - Personally Reviewed ? ?Physical Exam  ? ?VITAL SIGNS:  reviewed ?GEN:  no acute distress ?RESPIRATORY:  normal respiratory effort, symmetric expansion ?NEURO:  alert and oriented x 3, no obvious focal deficit ?PSYCH:  normal affect ? ?Labs  ?  ?Chemistry ?Recent Labs  ?Lab 11/12/21 ?1025 11/13/21 ?0110 11/14/21 ?0756  ?NA 136 134* 128*  ?K 4.5 3.5 3.3*  ?CL 101 99 93*  ?CO2 '30 30 29  '$ ?GLUCOSE 259* 177* 122*  ?BUN 34* 37* 36*  ?CREATININE 0.84 0.76 0.63  ?CALCIUM 8.6* 8.3* 7.6*  ?PROT 5.3* 5.2* 4.5*  ?ALBUMIN 2.8* 2.8* 2.5*  ?AST 29 34 39  ?ALT 28 37 59*  ?ALKPHOS 47 47 41  ?BILITOT 0.2* 0.4 0.4  ?GFRNONAA >60 >60 >60  ?ANIONGAP '5 5 6  '$ ?  ? ?Hematology ?Recent Labs  ?Lab 11/12/21 ?8527 11/13/21 ?0110 11/14/21 ?0756  ?WBC 16.7* 17.7* 17.3*  ?RBC 3.87* 3.55* 2.58*  ?HGB 11.7* 10.9* 8.0*  ?HCT 35.6* 32.5* 23.1*  ?MCV 92.0 91.5 89.5  ?MCH 30.2 30.7 31.0  ?MCHC 32.9 33.5 34.6  ?  RDW 13.6 13.3 13.1  ?PLT 273 264 272  ? ? ?Cardiac EnzymesNo results for input(s): TROPONINI in the last 168 hours. No results for input(s): TROPIPOC in the last 168 hours.  ? ?BNP ?Recent Labs  ?Lab 11/07/2021 ?2100  ?BNP 695.6*  ?  ? ?DDimer  ?Recent Labs  ?Lab 11/10/21 ?1937 11/11/21 ?9024 11/12/21 ?0452  ?DDIMER 1.27* 0.96* 0.68*  ?  ? ?Radiology  ?  ?ECHOCARDIOGRAM LIMITED ? ?Result Date: 11/14/2021 ?   ECHOCARDIOGRAM LIMITED REPORT   Patient Name:   FARRON WATROUS Date of Exam: 11/14/2021 Medical Rec #:  [id number]        Height:       69.0 in Accession #:    [id number]       Weight:       183.9 lb Date of Birth:  1930/03/22        BSA:          1.993 m? Patient Age:    88 years         BP:           137/69 mmHg Patient Gender: M                HR:           83 bpm. Exam Location:  Inpatient Procedure: Limited Echo, 3D Echo, Cardiac Doppler, Color Doppler and            Intracardiac Opacification Agent Indications:    I51.9 Decreased LV function   History:        Patient has prior history of Echocardiogram examinations, most                 recent 11/09/2021. CAD, Abnormal ECG, COPD, Aortic Valve Disease,                 Arrythmias:RBBB; Risk Factors:Diabetes and Dyslipidemia. Covid                 positive. Aortic dissection.  Sonographer:    Roseanna Rainbow RDCS Referring Phys: [id number] Nadean Corwin A [place]  1. Left ventricular ejection fraction, by estimation, is 45 to 50%. The left ventricle has mildly decreased function. The left ventricle demonstrates global hypokinesis.  2. Right ventricular systolic function is normal. The right ventricular size is mildly enlarged. There is mildly elevated pulmonary artery systolic pressure. The estimated right ventricular systolic pressure is 92.1 mmHg.  3. The mitral valve is grossly normal. No evidence of mitral valve regurgitation. No evidence of mitral stenosis.  4. Aortic valve regurgitation is not visualized.  5. The inferior vena cava is normal in size with greater than 50% respiratory variability, suggesting right atrial pressure of 3 mmHg. Comparison(s): Prior images reviewed side by side. Changes from prior study are noted. Conclusion(s)/Recommendation(s): Global hypokinesis in LV, though slightly more pronounced in septal/inferior walls (similar pattern on prior echo). Overall appears slightly improved compared to prior. FINDINGS  Left Ventricle: Global hypokinesis, though slightly more pronounced in septal/inferior walls, similar to prior. Left ventricular ejection fraction, by estimation, is 45 to 50%. The left ventricle has mildly decreased function. The left ventricle demonstrates global hypokinesis. Definity contrast agent was given IV to delineate the left ventricular endocardial borders. There is no left ventricular hypertrophy. Right Ventricle: The right ventricular size is mildly enlarged. Right ventricular systolic function is normal. There is mildly elevated pulmonary artery systolic pressure.  The tricuspid regurgitant velocity is 3.04 m/s, and with an assumed right atrial pressure of  3 mmHg, the estimated right ventricular systolic pressure is 08.0 mmHg. Mitral Valve: The mitral valve is grossly normal. No evidence of mitral valve stenosis. Tricuspid Valve: The tricuspid valve is grossly normal. Tricuspid valve regurgitation is trivial. No evidence of tricuspid stenosis. Aortic Valve: Aortic valve regurgitation is not visualized. Pulmonic Valve: The pulmonic valve was not well visualized. Venous: The inferior vena cava is normal in size with greater than 50% respiratory variability, suggesting right atrial pressure of 3 mmHg. LEFT VENTRICLE PLAX 2D LVIDd:         4.30 cm LVIDs:         3.40 cm LV PW:         1.10 cm LV IVS:        1.10 cm                            3D Volume EF:                            3D EF:        42 % LV Volumes (MOD)           LV EDV:       122 ml LV vol d, MOD A2C: 86.0 ml LV ESV:       70 ml LV vol d, MOD A4C: 73.8 ml LV SV:        52 ml LV vol s, MOD A2C: 52.2 ml LV vol s, MOD A4C: 44.5 ml LV SV MOD A2C:     33.8 ml LV SV MOD A4C:     73.8 ml LV SV MOD BP:      33.8 ml IVC IVC diam: 1.80 cm LEFT ATRIUM         Index LA diam:    3.90 cm 1.96 cm/m?   AORTA Ao Root diam: 3.20 cm Ao Asc diam:  3.45 cm TRICUSPID VALVE TR Peak grad:   37.0 mmHg TR Vmax:        304.00 cm/s Buford Dresser MD Electronically signed by Buford Dresser MD Signature Date/Time: 11/14/2021/1:54:04 PM    Final    ? ?Cardiac Studies  ? ? ? ?Patient Profile  ?   ? ? ? ?Assessment & Plan  ?  ?Principal Problem: ?  Acute respiratory failure with hypoxia ([place]) ?Active Problems: ?  Type 2 diabetes, controlled, with retinopathy ([place]) ?  CAD (coronary artery disease) ?  COPD GOLD III ?  Hypertension, essential ?  CAP (community acquired pneumonia) ?  Acute respiratory failure with hypoxemia ([facility]) ? ?Discussed plan of care by phone with wife, son. Conversation recorded by son.  ?Echocardiogram shows EF of  50%. Suspect some of septal wall motion abnormality is pacing related and not worrisome.  ?Given these findings we discussed in shared decision making that we will defer cath until he follows up with Dr. Arma Heading

## 2021-11-14 NOTE — Discharge Instructions (Signed)
Cardiology follow up: April 7th with Dr. [name] at 9:30 ?

## 2021-11-14 NOTE — Progress Notes (Signed)
?PROGRESS NOTE ? ? ? ?Phillip Jordan  WUJ:[id number] DOB: April 20, 1930 DOA: 10/30/2021 ?PCP: Colon Branch, MD  ? ? ?Brief Narrative:  ?88 year old gentleman with history of coronary artery disease, permanent pacemaker, AAA, type 2 diabetes on insulin, COPD Gold stage III who was diagnosed with COVID-19 on 6/16 at home, treated with Paxlovid and brought to ER because of feeling poorly, anorexia and myalgia.  In the emergency room saturating 70%, low-grade temperature.  Initially needed BiPAP due to respiratory distress, started on antibiotics and admitted to the hospital.  Significant finding with sodium of 118 on presentation with lactic acid of 3.3.  WBC count of 25.  He was admitted to stepdown unit.  Ultimately also found with non-STEMI. ? ? ?Assessment & Plan: ?  ?Pneumonia due to COVID-19 virus, acute hypoxemic respiratory failure secondary to COVID-19: COPD exacerbation due to COVID-19 virus infection. ?Continue to monitor due to significant symptoms  ?chest physiotherapy, incentive spirometry, deep breathing exercises, sputum induction, mucolytic's and bronchodilators. ?Supplemental oxygen to keep saturations more than 90%. ?Covid directed therapy with , ?steroids, patient was on Solu-Medrol, currently on prednisone taper. ?remdesivir, received 5 days therapy, completed. ?actemra, received 1 dose of Actemra on 3/20.  CRP improved. ?antibiotics, treated with Rocephin and azithromycin due to asymmetrical opacity right more than left.   ?Completed 5 days of azithromycin.  Rocephin day 6/7. ?Initially on BiPAP, currently remains on nasal cannula oxygen.  Keep on nasal cannula oxygen to keep saturation more than 90%. ? ?Non-STEMI in a patient with previous coronary artery disease and stents: ?Currently chest pain-free.  Remains on heparin infusion.  He is on heparin, nitro, aspirin.  Already on beta-blockers.  Cardiology planning for cardiac cath once he is able to lay flat on the cath table.  Probably next  week. ? ?Paroxysmal A-fib with sick sinus syndrome status post pacemaker: Stable.  Currently on metoprolol.  Rate controlled.  AV paced rhythm.  On Eliquis at home that is on hold. ? ?Severe hyponatremia secondary to hypovolemia: Presentation sodium 118.   ?Patient probably has chronic hyponatremia.  Sodium 128.  Continue to monitor. ? ?Type 2 diabetes with hyperglycemia, worse with IV steroids: Currently on long-acting insulin and sliding scale insulin.  Adequately controlled.  Continue similar doses today. ? ?Hypokalemia: Replace and monitor. ? ? ? ? ?DVT prophylaxis:   Heparin infusion ? ? ?Code Status: DNR ?Family Communication: Wife at the bedside. ?Disposition Plan: Status is: Inpatient ?Remains inpatient appropriate because: Significantly hypoxic, inpatient procedures planned ?  ? ? ?Consultants:  ?PCCM ?Cardiology ? ?Procedures:  ?None ? ?Antimicrobials:  ?Currently on Rocephin azithromycin day 5/7 ?Completed remdesivir ? ? ?Subjective: ? ?Patient seen and examined.  Dry cough and some rib pain on deep breathing otherwise no other complaints.  Wife was at the bedside.  Remains afebrile.  Tolerating heparin. ? ?Objective: ?Vitals:  ? 11/14/21 0544 11/14/21 9562 11/14/21 0838 11/14/21 1051  ?BP: (!) 112/59 137/69 127/66   ?Pulse: 82  83   ?Resp: '16 16 18   '$ ?Temp: 97.8 ?F (36.6 ?C)     ?TempSrc: Oral     ?SpO2: 100%  100% 100%  ?Weight:      ?Height:      ? ? ?Intake/Output Summary (Last 24 hours) at 11/14/2021 1200 ?Last data filed at 11/13/2021 2300 ?Gross per 24 hour  ?Intake 1259.94 ml  ?Output 1700 ml  ?Net -440.06 ml  ? ?Filed Weights  ? 11/10/2021 2059 11/13/21 0644  ?Weight: 76.2 kg 83.4 kg  ? ? ?  Examination: ? ?General exam: Appears calm and comfortable.  Hard of hearing.Frail and age-appropriate. ?Respiratory system: Mostly clear. ?SpO2: 100 % ?O2 Flow Rate (L/min): 5 L/min ?FiO2 (%): 30 %  ?Cardiovascular system: S1 & S2 heard, RRR.  Pacemaker in place. ?Gastrointestinal system: Abdomen is nondistended,  soft and nontender. No organomegaly or masses felt. Normal bowel sounds heard. ?Central nervous system: Alert and oriented. No focal neurological deficits.   ? ? ? ? ?Data Reviewed: I have personally reviewed following labs and imaging studies ? ?CBC: ?Recent Labs  ?Lab 11/10/21 ?8657 11/11/21 ?8469 11/12/21 ?6295 11/13/21 ?0110 11/14/21 ?0756  ?WBC 23.1* 22.8* 16.7* 17.7* 17.3*  ?NEUTROABS 21.2* 21.1* 15.2* 15.0* 11.7*  ?HGB 14.5 13.4 11.7* 10.9* 8.0*  ?HCT 43.3 38.8* 35.6* 32.5* 23.1*  ?MCV 91.2 89.4 92.0 91.5 89.5  ?PLT 286 302 273 264 272  ? ?Basic Metabolic Panel: ?Recent Labs  ?Lab 11/10/21 ?1547 11/11/21 ?2841 11/12/21 ?3244 11/13/21 ?0110 11/14/21 ?0756  ?NA 131* 129* 136 134* 128*  ?K 4.3 4.1 4.5 3.5 3.3*  ?CL 97* 97* 101 99 93*  ?CO2 '26 23 30 30 29  '$ ?GLUCOSE 205* 267* 259* 177* 122*  ?BUN 33* 37* 34* 37* 36*  ?CREATININE 0.87 0.87 0.84 0.76 0.63  ?CALCIUM 8.7* 8.4* 8.6* 8.3* 7.6*  ? ?GFR: ?Estimated Creatinine Clearance: 62.6 mL/min (by C-G formula based on SCr of 0.63 mg/dL). ?Liver Function Tests: ?Recent Labs  ?Lab 11/10/21 ?0102 11/11/21 ?7253 11/12/21 ?6644 11/13/21 ?0110 11/14/21 ?0756  ?AST 73* 51* 29 34 39  ?ALT '31 31 28 '$ 37 59*  ?ALKPHOS 03 47 42 59 56  ?BILITOT 0.3 0.2* 0.2* 0.4 0.4  ?PROT 7.1 6.1* 5.3* 5.2* 4.5*  ?ALBUMIN 3.5 3.1* 2.8* 2.8* 2.5*  ? ?No results for input(s): LIPASE, AMYLASE in the last 168 hours. ?No results for input(s): AMMONIA in the last 168 hours. ?Coagulation Profile: ?Recent Labs  ?Lab 10/25/2021 ?2100  ?INR 1.3*  ? ?Cardiac Enzymes: ?No results for input(s): CKTOTAL, CKMB, CKMBINDEX, TROPONINI in the last 168 hours. ?BNP (last 3 results) ?No results for input(s): PROBNP in the last 8760 hours. ?HbA1C: ?No results for input(s): HGBA1C in the last 72 hours. ?CBG: ?Recent Labs  ?Lab 11/13/21 ?1157 11/13/21 ?1625 11/13/21 ?2309 11/14/21 ?3875 11/14/21 ?1151  ?GLUCAP 207* 220* 194* 119* 192*  ? ?Lipid Profile: ?No results for input(s): CHOL, HDL, LDLCALC, TRIG, CHOLHDL, LDLDIRECT  in the last 72 hours. ?Thyroid Function Tests: ?No results for input(s): TSH, T4TOTAL, FREET4, T3FREE, THYROIDAB in the last 72 hours. ?Anemia Panel: ?No results for input(s): VITAMINB12, FOLATE, FERRITIN, TIBC, IRON, RETICCTPCT in the last 72 hours. ?Sepsis Labs: ?Recent Labs  ?Lab 10/27/2021 ?2100 10/23/2021 ?2306 11/09/21 ?6433  ?PROCALCITON  --   --  6.73  ?LATICACIDVEN 2.6* 3.3*  --   ? ? ?Recent Results (from the past 240 hour(s))  ?Resp Panel by RT-PCR (Flu A&B, Covid) Nasopharyngeal Swab     Status: Abnormal  ? Collection Time: 10/28/2021 10:38 PM  ? Specimen: Nasopharyngeal Swab; Nasopharyngeal(NP) swabs in vial transport medium  ?Result Value Ref Range Status  ? SARS Coronavirus 2 by RT PCR POSITIVE (A) NEGATIVE Final  ?  Comment: (NOTE) ?SARS-CoV-2 target nucleic acids are DETECTED. ? ?The SARS-CoV-2 RNA is generally detectable in upper respiratory ?specimens during the acute phase of infection. Positive results are ?indicative of the presence of the identified virus, but do not rule ?out bacterial infection or co-infection with other pathogens not ?detected by the test. Clinical correlation with patient history  and ?other diagnostic information is necessary to determine patient ?infection status. The expected result is Negative. ? ?Fact Sheet for Patients: ?EntrepreneurPulse.com.au ? ?Fact Sheet for Healthcare Providers: ?IncredibleEmployment.be ? ?This test is not yet approved or cleared by the [place] FDA and  ?has been authorized for detection and/or diagnosis of SARS-CoV-2 by ?FDA under an Emergency Use Authorization (EUA).  This EUA will ?remain in effect (meaning this test can be used) for the duration of  ?the COVID-19 declaration under Section 564(b)(1) of the [place], 21 ?[place]C. section 360bbb-3(b)(1), unless the authorization is ?terminated or revoked sooner. ? ?  ? Influenza A by PCR NEGATIVE NEGATIVE Final  ? Influenza B by PCR NEGATIVE NEGATIVE Final  ?   Comment: (NOTE) ?The Xpert Xpress SARS-CoV-2/FLU/RSV plus assay is intended as an aid ?in the diagnosis of influenza from Nasopharyngeal swab specimens and ?should not be used as a sole basis for treatment. Nasal

## 2021-11-15 DIAGNOSIS — J9601 Acute respiratory failure with hypoxia: Secondary | ICD-10-CM | POA: Diagnosis not present

## 2021-11-15 LAB — RENAL FUNCTION PANEL
Albumin: 2.5 g/dL — ABNORMAL LOW (ref 3.5–5.0)
Anion gap: 6 (ref 5–15)
BUN: 39 mg/dL — ABNORMAL HIGH (ref 8–23)
CO2: 30 mmol/L (ref 22–32)
Calcium: 7.9 mg/dL — ABNORMAL LOW (ref 8.9–10.3)
Chloride: 90 mmol/L — ABNORMAL LOW (ref 98–111)
Creatinine, Ser: 0.75 mg/dL (ref 0.61–1.24)
GFR, Estimated: >60 mL/min (ref >60)
Glucose, Bld: 269 mg/dL — ABNORMAL HIGH (ref 70–99)
Phosphorus: 2.6 mg/dL (ref 2.5–4.6)
Potassium: 4.9 mmol/L (ref 3.5–5.1)
Sodium: 126 mmol/L — ABNORMAL LOW (ref 135–145)

## 2021-11-15 LAB — GLUCOSE, CAPILLARY
Glucose-Capillary: 153 mg/dL — ABNORMAL HIGH (ref 70–99)
Glucose-Capillary: 200 mg/dL — ABNORMAL HIGH (ref 70–99)
Glucose-Capillary: 228 mg/dL — ABNORMAL HIGH (ref 70–99)
Glucose-Capillary: 252 mg/dL — ABNORMAL HIGH (ref 70–99)
Glucose-Capillary: 289 mg/dL — ABNORMAL HIGH (ref 70–99)

## 2021-11-15 LAB — SODIUM, URINE, RANDOM: Sodium, Ur: 84 mmol/L

## 2021-11-15 LAB — TSH: TSH: 1.407 u[IU]/mL (ref 0.350–4.500)

## 2021-11-15 MED ORDER — LEVALBUTEROL HCL 0.63 MG/3ML IN NEBU
0.6300 mg | INHALATION_SOLUTION | Freq: Four times a day (QID) | RESPIRATORY_TRACT | Status: DC | PRN
  Administered 2021-11-15: 0.63 mg via RESPIRATORY_TRACT
  Filled 2021-11-15: qty 3

## 2021-11-15 MED ORDER — FUROSEMIDE 10 MG/ML IJ SOLN
40.0000 mg | Freq: Once | INTRAMUSCULAR | Status: AC
  Administered 2021-11-15: 40 mg via INTRAVENOUS
  Filled 2021-11-15: qty 4

## 2021-11-15 NOTE — Progress Notes (Signed)
Physical Therapy Treatment ?Patient Details ?Name: Phillip Jordan ?MRN: [id number] ?DOB: [birth year] ?Today's Date: 11/15/2021 ? ? ?History of Present Illness Pt is an 88 year old male admitted with pneumonia due to COVID-19 virus, acute hypoxemic respiratory failure secondary to COVID-19. Initially needed BiPAP due to respiratory distress. Also found with non-STEMI. PMH significant for coronary artery disease, permanent pacemaker, AAA, type 2 diabetes on insulin, COPD. ? ?  ?PT Comments  ? ? Pt limited with progression of mobility today due to "not feeling good". Pt only able to sit EOB today. While EOB reporting that he is feeling "weak". Onset of mild dizziness, nausea became diaphoretic and having pallor, reports seeing "red spots". Pt requesting to return to supine and deferring transfer to chair stating "I need to lay back down, I dont feel good" vitals taken immediately in supine as pt unable to tolerate sitting EOB any longer. Vitals:116/65, 80bpm, O2 sats 99-100% during session on 6L [place], RN notified. Pt will benefit from continued skilled PT to increase his independence and maximize safety with mobility.  ?  ?Recommendations for follow up therapy are one component of a multi-disciplinary discharge planning process, led by the attending physician.  Recommendations may be updated based on patient status, additional functional criteria and insurance authorization. ? ?Follow Up Recommendations ? Home health PT ?  ?  ?Assistance Recommended at Discharge Frequent or constant Supervision/Assistance  ?Patient can return home with the following A little help with walking and/or transfers;A little help with bathing/dressing/bathroom;Assist for transportation;Help with stairs or ramp for entrance;Assistance with cooking/housework ?  ?Equipment Recommendations ? Rolling walker (2 wheels)  ?  ?Recommendations for Other Services   ? ? ?  ?Precautions / Restrictions Precautions ?Precautions: Fall ?Precaution Comments:  monitor sats ?Restrictions ?Weight Bearing Restrictions: No  ?  ? ?Mobility ? Bed Mobility ?Overal bed mobility: Needs Assistance ?Bed Mobility: Supine to Sit, Sit to Supine ?  ?  ?Supine to sit: Supervision, HOB elevated ?Sit to supine: Min guard, HOB elevated ?  ?General bed mobility comments: Pt able to come to sitting EOB without assist, MIN guard for LEs to get back to bed. Use of bed rails. Pt reporting that he is feeling "weak" once sitting EOB. Onset of mild dizziness, nausea became diaphoretic and having pallor, reports seeing "red spots". Pt requesting to return to supine and deferring transfer to chair stating "I need to lay back down, I dont feel good" vitals taken immediately in supine as pt unable to tolerate sitting EOB any longer. Vitals:116/65, 80bpm, O2 sats 99-100% during session on 6L [place]. Symptoms improved with rest in supine. Positioned in supine with B UEs on pillows for edema management L UE>R UE edema. ?  ? ?Transfers ?  ?  ?  ?  ?  ?  ?  ?  ?  ?  ?  ? ?Ambulation/Gait ?  ?  ?  ?  ?  ?  ?  ?  ? ? ?Stairs ?  ?  ?  ?  ?  ? ? ?Wheelchair Mobility ?  ? ?Modified Rankin (Stroke Patients Only) ?  ? ? ?  ?Balance Overall balance assessment: Needs assistance ?Sitting-balance support: Feet supported ?Sitting balance-Leahy Scale: Good ?  ?  ?  ?  ?  ?  ?  ?  ?  ?  ?  ?  ?  ?  ?  ?  ?  ? ?  ?Cognition Arousal/Alertness: Awake/alert ?Behavior During Therapy: [facility] for tasks assessed/performed ?Overall Cognitive Status:  Within Functional Limits for tasks assessed ?  ?  ?  ?  ?  ?  ?  ?  ?  ?  ?  ?  ?  ?  ?  ?  ?  ?  ?  ? ?  ?Exercises   ? ?  ?General Comments   ?  ?  ? ?Pertinent Vitals/Pain Pain Assessment ?Pain Assessment: No/denies pain  ? ? ?Home Living   ?  ?  ?  ?  ?  ?  ?  ?  ?  ?   ?  ?Prior Function    ?  ?  ?   ? ?PT Goals (current goals can now be found in the care plan section) Acute Rehab PT Goals ?Patient Stated Goal: Get strength/independence back ?PT Goal Formulation: With  patient/family ?Time For Goal Achievement: 11/27/21 ?Potential to Achieve Goals: Good ?Progress towards PT goals: Progressing toward goals ? ?  ?Frequency ? ? ? Min 3X/week ? ? ? ?  ?PT Plan Current plan remains appropriate  ? ? ?Co-evaluation   ?  ?  ?  ?  ? ?  ?AM-PAC PT "6 Clicks" Mobility   ?Outcome Measure ? Help needed turning from your back to your side while in a flat bed without using bedrails?: A Little ?Help needed moving from lying on your back to sitting on the side of a flat bed without using bedrails?: A Little ?Help needed moving to and from a bed to a chair (including a wheelchair)?: A Little ?Help needed standing up from a chair using your arms (e.g., wheelchair or bedside chair)?: A Little ?Help needed to walk in hospital room?: A Little ?Help needed climbing 3-5 steps with a railing? : A Little ?6 Click Score: 18 ? ?  ?End of Session Equipment Utilized During Treatment: Oxygen ?Activity Tolerance: Treatment limited secondary to medical complications (Comment) (diaphoresis, pallor, mild dizziness) ?Patient left: in bed;with call bell/phone within reach;with family/visitor present ?Nurse Communication: Mobility status;Other (comment) (symptoms and vitals during session) ?PT Visit Diagnosis: Unsteadiness on feet (R26.81);Muscle weakness (generalized) (M62.81) ?  ? ? ?Time: 2111-5520 ?PT Time Calculation (min) (ACUTE ONLY): 22 min ? ?Charges:  $Therapeutic Activity: 8-22 mins          ?          ? ?Festus Barren., PT, DPT  ?Acute Rehabilitation Services  ?Office [phone] ? ? ?11/15/2021, 11:44 AM ? ?

## 2021-11-15 NOTE — Progress Notes (Signed)
No AM labs available to review. Continue ASA 81 mg daily and Eliquis 5 mg BID at this time. Defer plavix until next CBC. Family aware per conversation yesterday.  ?Cardiology following from [place]. ?Contact our service prior to dismissal for final med recommendations. ?

## 2021-11-15 NOTE — Progress Notes (Signed)
?PROGRESS NOTE ? ? ? ?Phillip Jordan  XBJ:[id number] DOB: 06-13-1930 DOA: 11/05/2021 ?PCP: Colon Branch, MD  ? ? ?Brief Narrative:  ?88 year old gentleman with history of coronary artery disease, permanent pacemaker, AAA, type 2 diabetes on insulin, COPD Gold stage III who was diagnosed with COVID-19 on 6/16 at home, treated with Paxlovid and brought to ER because of feeling poorly, anorexia and myalgia.  In the emergency room saturating 70%, low-grade temperature.  Initially needed BiPAP due to respiratory distress, started on antibiotics and admitted to the hospital.  Significant finding with sodium of 118 on presentation with lactic acid of 3.3.  WBC count of 25.  He was admitted to stepdown unit.  Ultimately also found with non-STEMI. ? ?11/15/2021: Patient seen alongside patient's wife and nurse.  Patient had an episode of worsening shortness of breath with some diaphoresis.  Cardiology input is appreciated.  IV fluid has been discontinued.  IV Lasix 40 Mg x1 dose abdomen ordered.  No chest pain.  No fever or chills.  No other constitutional symptoms reported. ? ? ?Assessment & Plan: ?Pneumonia due to COVID-19 virus, acute hypoxemic respiratory failure secondary to COVID-19: COPD exacerbation due to COVID-19 virus infection. ?Continue to monitor due to significant symptoms  ?chest physiotherapy, incentive spirometry, deep breathing exercises, sputum induction, mucolytic's and bronchodilators. ?Supplemental oxygen to keep saturations more than 90%. ?Covid directed therapy with , ?steroids, patient was on Solu-Medrol, currently on prednisone taper. ?remdesivir, received 5 days therapy, completed. ?actemra, received 1 dose of Actemra on 3/20.  CRP improved. ?antibiotics, treated with Rocephin and azithromycin due to asymmetrical opacity right more than left.   ?Completed 5 days of azithromycin.  Rocephin day 6/7. ?Initially on BiPAP, currently remains on nasal cannula oxygen.  Keep on nasal cannula oxygen to keep  saturation more than 90%. ?11/15/2021: O2 sat is currently 100% on supplemental oxygen.  IV fluid has been discontinued.  IV Lasix 40 Mg x1 dose given. ? ?Non-STEMI in a patient with previous coronary artery disease and stents: ?Currently chest pain-free.  Remains on heparin infusion.  He is on heparin, nitro, aspirin.  Already on beta-blockers.  Cardiology planning for cardiac cath once he is able to lay flat on the cath table.  Probably next week. ?11/15/2021: Cardiology team is managing.  Cardiology may be planning cardiac catheterization on outpatient basis. ? ?Paroxysmal A-fib with sick sinus syndrome status post pacemaker: Stable.  Currently on metoprolol.  Rate controlled.  AV paced rhythm.  On Eliquis at home that is on hold. ? ?Severe hyponatremia secondary to hypovolemia: Presentation sodium 118.   ?Patient probably has chronic hyponatremia.  Sodium 128.  Continue to monitor. ?11/15/2021: No new labs today.  We will repeat renal panel.  We will check urine sodium, urine and serum osmolality, TSH and cortisol level.   ? ?Type 2 diabetes with hyperglycemia, worse with IV steroids: Currently on long-acting insulin and sliding scale insulin.  Adequately controlled.  Continue similar doses today. ? ?Hypokalemia: Replace and monitor. ?11/15/2021: No new labs today. ? ?DVT prophylaxis:   Heparin infusion ?apixaban (ELIQUIS) tablet 5 mg  ? ?Code Status: DNR ?Family Communication: Wife at the bedside. ?Disposition Plan: Status is: Inpatient ?Remains inpatient appropriate because: Significantly hypoxic, inpatient procedures planned ?  ? ? ?Consultants:  ?PCCM ?Cardiology ? ?Procedures:  ?None ? ?Antimicrobials:  ?Currently on Rocephin azithromycin day 5/7 ?Completed remdesivir ? ? ?Subjective: ? ?Patient seen and examined.  Dry cough and some rib pain on deep breathing otherwise no other  complaints.  Wife was at the bedside.  Remains afebrile.  Tolerating heparin. ? ?Objective: ?Vitals:  ? 11/15/21 0847 11/15/21 0924  11/15/21 1401 11/15/21 1444  ?BP: 116/69  (!) 137/56 (!) 128/52  ?Pulse: (!) 103  72 73  ?Resp: '20  20 18  '$ ?Temp: 98.3 ?F (36.8 ?C)  97.8 ?F (36.6 ?C) 97.9 ?F (36.6 ?C)  ?TempSrc: Oral  Oral Oral  ?SpO2: 100% 95% 100% 100%  ?Weight:      ?Height:      ? ? ?Intake/Output Summary (Last 24 hours) at 11/15/2021 1513 ?Last data filed at 11/15/2021 1300 ?Gross per 24 hour  ?Intake 2936.19 ml  ?Output 2050 ml  ?Net 886.19 ml  ? ? ?Filed Weights  ? 11/16/2021 2059 11/13/21 0644 11/15/21 0507  ?Weight: 76.2 kg 83.4 kg 86.4 kg  ? ? ?Examination: ? ?General exam: Not in any distress.  Volume overloaded.  Awake and alert. ?HEENT: Patient is pale.  No jaundice. ?Neck: Supple.  JVD is difficult to assess.   ?Respiratory system: Decreased air entry globally. ?SpO2: 100 % ?O2 Flow Rate (L/min): 6 L/min ?FiO2 (%): 30 %  ?Cardiovascular system: S1 & S2  ?Gastrointestinal system: Abdomen is obese, soft and nontender.  Organs are difficult to assess. ?Extremities: No edema. ?Neuro: Awake and alert.  Patient moves all extremities.   ? ?Data Reviewed: I have personally reviewed following labs and imaging studies ? ?CBC: ?Recent Labs  ?Lab 11/10/21 ?2536 11/11/21 ?6440 11/12/21 ?3474 11/13/21 ?0110 11/14/21 ?0756  ?WBC 23.1* 22.8* 16.7* 17.7* 17.3*  ?NEUTROABS 21.2* 21.1* 15.2* 15.0* 11.7*  ?HGB 14.5 13.4 11.7* 10.9* 8.0*  ?HCT 43.3 38.8* 35.6* 32.5* 23.1*  ?MCV 91.2 89.4 92.0 91.5 89.5  ?PLT 286 302 273 264 272  ? ? ?Basic Metabolic Panel: ?Recent Labs  ?Lab 11/10/21 ?1547 11/11/21 ?2595 11/12/21 ?6387 11/13/21 ?0110 11/14/21 ?0756  ?NA 131* 129* 136 134* 128*  ?K 4.3 4.1 4.5 3.5 3.3*  ?CL 97* 97* 101 99 93*  ?CO2 '26 23 30 30 29  '$ ?GLUCOSE 205* 267* 259* 177* 122*  ?BUN 33* 37* 34* 37* 36*  ?CREATININE 0.87 0.87 0.84 0.76 0.63  ?CALCIUM 8.7* 8.4* 8.6* 8.3* 7.6*  ? ? ?GFR: ?Estimated Creatinine Clearance: 68.2 mL/min (by C-G formula based on SCr of 0.63 mg/dL). ?Liver Function Tests: ?Recent Labs  ?Lab 11/10/21 ?5643 11/11/21 ?3295  11/12/21 ?1884 11/13/21 ?0110 11/14/21 ?0756  ?AST 73* 51* 29 34 39  ?ALT '31 31 28 '$ 37 59*  ?ALKPHOS 16 60 63 01 60  ?BILITOT 0.3 0.2* 0.2* 0.4 0.4  ?PROT 7.1 6.1* 5.3* 5.2* 4.5*  ?ALBUMIN 3.5 3.1* 2.8* 2.8* 2.5*  ? ? ?No results for input(s): LIPASE, AMYLASE in the last 168 hours. ?No results for input(s): AMMONIA in the last 168 hours. ?Coagulation Profile: ?Recent Labs  ?Lab 10/30/2021 ?2100  ?INR 1.3*  ? ? ?Cardiac Enzymes: ?No results for input(s): CKTOTAL, CKMB, CKMBINDEX, TROPONINI in the last 168 hours. ?BNP (last 3 results) ?No results for input(s): PROBNP in the last 8760 hours. ?HbA1C: ?No results for input(s): HGBA1C in the last 72 hours. ?CBG: ?Recent Labs  ?Lab 11/14/21 ?1151 11/14/21 ?1649 11/14/21 ?2344 11/15/21 ?1093 11/15/21 ?1144  ?GLUCAP 192* 277* 252* 153* 200*  ? ? ?Lipid Profile: ?No results for input(s): CHOL, HDL, LDLCALC, TRIG, CHOLHDL, LDLDIRECT in the last 72 hours. ?Thyroid Function Tests: ?No results for input(s): TSH, T4TOTAL, FREET4, T3FREE, THYROIDAB in the last 72 hours. ?Anemia Panel: ?No results for input(s): VITAMINB12, FOLATE, FERRITIN,  TIBC, IRON, RETICCTPCT in the last 72 hours. ?Sepsis Labs: ?Recent Labs  ?Lab 11/02/2021 ?2100 11/01/2021 ?2306 11/09/21 ?7017  ?PROCALCITON  --   --  6.73  ?LATICACIDVEN 2.6* 3.3*  --   ? ? ? ?Recent Results (from the past 240 hour(s))  ?Resp Panel by RT-PCR (Flu A&B, Covid) Nasopharyngeal Swab     Status: Abnormal  ? Collection Time: 11/17/2021 10:38 PM  ? Specimen: Nasopharyngeal Swab; Nasopharyngeal(NP) swabs in vial transport medium  ?Result Value Ref Range Status  ? SARS Coronavirus 2 by RT PCR POSITIVE (A) NEGATIVE Final  ?  Comment: (NOTE) ?SARS-CoV-2 target nucleic acids are DETECTED. ? ?The SARS-CoV-2 RNA is generally detectable in upper respiratory ?specimens during the acute phase of infection. Positive results are ?indicative of the presence of the identified virus, but do not rule ?out bacterial infection or co-infection with other pathogens  not ?detected by the test. Clinical correlation with patient history and ?other diagnostic information is necessary to determine patient ?infection status. The expected result is Negative. ? ?Fact Sheet for Patient

## 2021-11-16 ENCOUNTER — Inpatient Hospital Stay (HOSPITAL_COMMUNITY): Payer: HMO

## 2021-11-16 DIAGNOSIS — I10 Essential (primary) hypertension: Secondary | ICD-10-CM | POA: Diagnosis not present

## 2021-11-16 DIAGNOSIS — E11319 Type 2 diabetes mellitus with unspecified diabetic retinopathy without macular edema: Secondary | ICD-10-CM | POA: Diagnosis not present

## 2021-11-16 DIAGNOSIS — J9601 Acute respiratory failure with hypoxia: Secondary | ICD-10-CM | POA: Diagnosis not present

## 2021-11-16 DIAGNOSIS — J189 Pneumonia, unspecified organism: Secondary | ICD-10-CM | POA: Diagnosis not present

## 2021-11-16 LAB — CBC
HCT: 19 % — ABNORMAL LOW (ref 39.0–52.0)
HCT: 26.8 % — ABNORMAL LOW (ref 39.0–52.0)
Hemoglobin: 6.6 g/dL — CL (ref 13.0–17.0)
Hemoglobin: 9.5 g/dL — ABNORMAL LOW (ref 13.0–17.0)
MCH: 32.2 pg (ref 26.0–34.0)
MCH: 31.1 pg (ref 26.0–34.0)
MCHC: 34.7 g/dL (ref 30.0–36.0)
MCHC: 35.4 g/dL (ref 30.0–36.0)
MCV: 92.7 fL (ref 80.0–100.0)
MCV: 87.9 fL (ref 80.0–100.0)
Platelets: 327 10*3/uL (ref 150–400)
Platelets: 232 10*3/uL (ref 150–400)
RBC: 2.05 MIL/uL — ABNORMAL LOW (ref 4.22–5.81)
RBC: 3.05 MIL/uL — ABNORMAL LOW (ref 4.22–5.81)
RDW: 14.9 % (ref 11.5–15.5)
RDW: 14.5 % (ref 11.5–15.5)
WBC: 38.4 10*3/uL — ABNORMAL HIGH (ref 4.0–10.5)
WBC: 32.4 10*3/uL — ABNORMAL HIGH (ref 4.0–10.5)
nRBC: 2 % — ABNORMAL HIGH (ref 0.0–0.2)
nRBC: 2 % — ABNORMAL HIGH (ref 0.0–0.2)

## 2021-11-16 LAB — BASIC METABOLIC PANEL
Anion gap: 9 (ref 5–15)
BUN: 36 mg/dL — ABNORMAL HIGH (ref 8–23)
CO2: 29 mmol/L (ref 22–32)
Calcium: 8.1 mg/dL — ABNORMAL LOW (ref 8.9–10.3)
Chloride: 87 mmol/L — ABNORMAL LOW (ref 98–111)
Creatinine, Ser: 0.96 mg/dL (ref 0.61–1.24)
GFR, Estimated: >60 mL/min (ref >60)
Glucose, Bld: 237 mg/dL — ABNORMAL HIGH (ref 70–99)
Potassium: 4.4 mmol/L (ref 3.5–5.1)
Sodium: 125 mmol/L — ABNORMAL LOW (ref 135–145)

## 2021-11-16 LAB — OSMOLALITY, URINE: Osmolality, Ur: 463 mOsm/kg (ref 300–900)

## 2021-11-16 LAB — CORTISOL: Cortisol, Plasma: 11.3 ug/dL

## 2021-11-16 LAB — PREPARE RBC (CROSSMATCH)

## 2021-11-16 LAB — GLUCOSE, CAPILLARY
Glucose-Capillary: 240 mg/dL — ABNORMAL HIGH (ref 70–99)
Glucose-Capillary: 250 mg/dL — ABNORMAL HIGH (ref 70–99)
Glucose-Capillary: 301 mg/dL — ABNORMAL HIGH (ref 70–99)
Glucose-Capillary: 271 mg/dL — ABNORMAL HIGH (ref 70–99)

## 2021-11-16 LAB — OCCULT BLOOD X 1 CARD TO LAB, STOOL: Fecal Occult Bld: POSITIVE — AB

## 2021-11-16 LAB — OSMOLALITY: Osmolality: 288 mOsm/kg (ref 275–295)

## 2021-11-16 MED ORDER — SODIUM CHLORIDE 0.9% IV SOLUTION: Freq: Once | INTRAVENOUS | Status: AC

## 2021-11-16 MED ORDER — IOHEXOL 9 MG/ML PO SOLN
500.0000 mL | ORAL | Status: AC
  Administered 2021-11-16 (×2): 500 mL via ORAL

## 2021-11-16 MED ORDER — MORPHINE SULFATE (PF) 2 MG/ML IV SOLN
2.0000 mg | Freq: Once | INTRAVENOUS | Status: AC
  Administered 2021-11-16: 2 mg via INTRAVENOUS
  Filled 2021-11-16: qty 1

## 2021-11-16 MED ORDER — POLYETHYLENE GLYCOL 3350 17 G PO PACK
17.0000 g | PACK | Freq: Two times a day (BID) | ORAL | Status: AC
  Administered 2021-11-16 – 2021-11-17 (×3): 17 g via ORAL
  Filled 2021-11-16 (×3): qty 1

## 2021-11-16 MED ORDER — FUROSEMIDE 10 MG/ML IJ SOLN
60.0000 mg | Freq: Two times a day (BID) | INTRAMUSCULAR | Status: AC
  Administered 2021-11-16 – 2021-11-17 (×2): 60 mg via INTRAVENOUS
  Filled 2021-11-16 (×2): qty 6

## 2021-11-16 MED ORDER — PREDNISONE 5 MG PO TABS
30.0000 mg | ORAL_TABLET | Freq: Every day | ORAL | Status: DC
  Administered 2021-11-16: 30 mg via ORAL
  Filled 2021-11-16: qty 2

## 2021-11-16 NOTE — Progress Notes (Signed)
Patient tolerated getting OOB and up to chair x2 assist. Pt c/o dizziness and "weakness" while sitting on EOB, but able to stand and stated he felt better after sitting in chair for a few mins. Feet elevated, elbows propped up to assist with edema. O2 Sats remained 95% 3L/[place], currently 99% 3L/[place] while at rest in chair. Pt has a large bruise to right abdomen but this is not tender to palpation. Pt c/o pain to left side, states this is from coughing. No bruise noted to left side. MD Venetia Constable made aware of bruise and left sided pain. Awaiting BM to collect Occult Stool. VSS, blood transfusing with no complications. Will continue to monitor closely.  ?

## 2021-11-16 NOTE — Progress Notes (Addendum)
[facility] ?PROGRESS NOTE ? ? ? ?Progress Note  ?Phillip Jordan  IRS:[id number] DOB: Dec 19, 1929 DOA: 11/16/2021 ?PCP: Colon Branch, MD  ? ? ? ?Brief Narrative:  ? ?Phillip Jordan is an 88 y.o. 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 ? ? ?Assessment/Plan:  ? ?Acute respiratory failure with hypoxia ([name]) due to COVID-19 pneumonia: ?He was requiring 6 L of oxygen to keep saturations above 90%. ?This morning we have brought him down to 4 L of oxygen his saturations have remained greater than 90%. ?Try to wean to room air, continue chest physiotherapy incentive spirometry out of bed to chair. ?Currently on steroid taper, has completed 5-day course of IV remdesivir.  He also received Actemra on 11/10/2021. ?Due to the concern of bacterial infection he was started on IV Rocephin and azithromycin.  He has completed 5-day course of azithromycin, will complete 7 of Rocephin. ?He was given a single dose of Lasix yesterday. ?Repeat a chest x-ray today ? ?NSTEMI: ?Remains on Eliquis, aspirin beta-blocker.  Cardiology recommended against Plavix ?Cardiology was consulted and planning for cardiac cath on an outpatient basis. ? ?Accessible atrial fibrillation/sick sinus syndrome status post pacemaker: ?Currently on metoprolol rate controlled AV paced continue Eliquis ? ?Hyponatremia: ?Secondary to hypovolemia, he appears euvolemic on physical exam. ?On presentation 19, it went up to 128 this morning 126. ?He was given IV Lasix on 0/04/3817 at 2:99, metabolic panel around that time showed a sodium of 126. ?Sodium is pending this morning.  Cortisol 13 TSH 1.4  urine sodium 84 urine osmolarity 463 serum osmolarity 288. ?He also has hyperglycemia which could be contributing to his hyponatremia. ? ?Type 2 diabetes, controlled, with retinopathy ([name]) ?Worsened due to steroids, currently on long-acting insulin plus sliding scale. ? ?Hypokalemia: ?It was repleted orally now resolved. ? ?Acute blood loss normocytic anemia: ?Check an FOBT continue Eliquis and aspirin we will go ahead and transfuse her 2 units of packed red blood cells recheck a CBC ? ?DVT prophylaxis: eliquis ?Family Communication:son ?Status is: Inpatient ?Remains inpatient appropriate because: Acute respiratory failure with hypoxia. ? ? ? ?Code Status:  ? ?  ?Code Status Orders  ?(From admission, onward)  ?  ? ? ?  ? ?  Start     Ordered  ? 11/09/21 1653  Do not attempt resuscitation (DNR)  Continuous       ?Question Answer Comment  ?In the event of cardiac or respiratory ARREST Do not call a ?code blue?   ?In the event of cardiac or respiratory ARREST Do not perform Intubation, CPR, defibrillation or ACLS   ?In the event of cardiac or respiratory ARREST Use medication by any route, position, wound care, and other measures to relive pain and suffering. May use oxygen, suction and manual treatment of airway obstruction as needed for comfort.   ?  ? 11/09/21 1652  ? ?  ?  ? ?  ? ?Code Status History   ? ? Date Active Date Inactive Code Status Order ID Comments User Context  ? 11/09/2021 0023 11/09/2021 1652 Full Code [id number]  Rise Patience, MD Inpatient  ? 11/06/2020 1305 11/06/2020 2318 Full Code [id number]  Evans Lance,  MD Inpatient  ? 11/06/2020 1305 11/06/2020 1305 Full Code [id number]  Baldwin [place], PA-C Inpatient  ? ?  ? ?Advance Directive Documentation   ? ?Flowsheet Row Most Recent Value  ?Type of Advance Directive Healthcare Power of Attorney  ?Pre-existing out of facility DNR order (yellow form or pink MOST form) --  ?"MOST" Form in Place? --  ? ?  ? ? ? ? ?IV Access:  ? ?Peripheral  IV ? ? ?Procedures and diagnostic studies:  ? ?ECHOCARDIOGRAM LIMITED ? ?Result Date: 11/14/2021 ?   ECHOCARDIOGRAM LIMITED REPORT   Patient Name:   Phillip Jordan Date of Exam: 11/14/2021 Medical Rec #:  [id number]        Height:       69.0 in Accession #:    [id number]       Weight:       183.9 lb Date of Birth:  Jun 17, 1930        BSA:          1.993 m? Patient Age:    60 years         BP:           137/69 mmHg Patient Gender: M                HR:           83 bpm. Exam Location:  Inpatient Procedure: Limited Echo, 3D Echo, Cardiac Doppler, Color Doppler and            Intracardiac Opacification Agent Indications:    I51.9 Decreased LV function  History:        Patient has prior history of Echocardiogram examinations, most                 recent 11/09/2021. CAD, Abnormal ECG, COPD, Aortic Valve Disease,                 Arrythmias:RBBB; Risk Factors:Diabetes and Dyslipidemia. Covid                 positive. Aortic dissection.  Sonographer:    Roseanna Rainbow RDCS Referring Phys: [id number] Nadean Corwin A New Site  1. Left ventricular ejection fraction, by estimation, is 45 to 50%. The left ventricle has mildly decreased function. The left ventricle demonstrates global hypokinesis.  2. Right ventricular systolic function is normal. The right ventricular size is mildly enlarged. There is mildly elevated pulmonary artery systolic pressure. The estimated right ventricular systolic pressure is 49.7 mmHg.  3. The mitral valve is grossly normal. No evidence of mitral valve regurgitation. No evidence of mitral stenosis.  4. Aortic valve regurgitation is not visualized.  5. The inferior vena cava is normal in size with greater than 50% respiratory variability, suggesting right atrial pressure of 3 mmHg. Comparison(s): Prior images reviewed side by side. Changes from prior study are noted. Conclusion(s)/Recommendation(s): Global hypokinesis in LV, though slightly more pronounced in septal/inferior walls (similar pattern on prior  echo). Overall appears slightly improved compared to prior. FINDINGS  Left Ventricle: Global hypokinesis, though slightly more pronounced in septal/inferior walls, similar to prior. Left ventricular ejection fraction, by estimation, is 45 to 50%. The left ventricle has mildly decreased function. The left ventricle demonstrates global hypokinesis. Definity contrast agent was given IV to delineate the left ventricular endocardial borders. There is no left ventricular hypertrophy. Right Ventricle: The right ventricular size is mildly enlarged. Right ventricular systolic function is normal. There is mildly elevated pulmonary artery systolic pressure. The tricuspid regurgitant velocity is  3.04 m/s, and with an assumed right atrial pressure of 3 mmHg, the estimated right ventricular systolic pressure is 01.6 mmHg. Mitral Valve: The mitral valve is grossly normal. No evidence of mitral valve stenosis. Tricuspid Valve: The tricuspid valve is grossly normal. Tricuspid valve regurgitation is trivial. No evidence of tricuspid stenosis. Aortic Valve: Aortic valve regurgitation is not visualized. Pulmonic Valve: The pulmonic valve was not well visualized. Venous: The inferior vena cava is normal in size with greater than 50% respiratory variability, suggesting right atrial pressure of 3 mmHg. LEFT VENTRICLE PLAX 2D LVIDd:         4.30 cm LVIDs:         3.40 cm LV PW:         1.10 cm LV IVS:        1.10 cm                            3D Volume EF:                            3D EF:        42 % LV Volumes (MOD)           LV EDV:       122 ml LV vol d, MOD A2C: 86.0 ml LV ESV:       70 ml LV vol d, MOD A4C: 73.8 ml LV SV:        52 ml LV vol s, MOD A2C: 52.2 ml LV vol s, MOD A4C: 44.5 ml LV SV MOD A2C:     33.8 ml LV SV MOD A4C:     73.8 ml LV SV MOD BP:      33.8 ml IVC IVC diam: 1.80 cm LEFT ATRIUM         Index LA diam:    3.90 cm 1.96 cm/m?   AORTA Ao Root diam: 3.20 cm Ao Asc diam:  3.45 cm TRICUSPID VALVE TR Peak grad:   37.0  mmHg TR Vmax:        304.00 cm/s Buford Dresser MD Electronically signed by Buford Dresser MD Signature Date/Time: 11/14/2021/1:54:04 PM    Final    ? ? ?Medical Consultants:  ? ?None. ? ? ?Subjective

## 2021-11-16 NOTE — Progress Notes (Signed)
Patient not seen. Chart reviewed. ? ?CBC demonstrates continually downtrending Hb of 6.6 today. With this data, would not start plavix at this time. Patient is on Eliquis and aspirin, would not escalate antiplatelet therapy until acute anemia workup is investigated - can be pursued by primary service.  ? ?Cardiology will follow from [place] while patient remains in hospital.  ?

## 2021-11-16 NOTE — Progress Notes (Addendum)
MD Aileen Fass paged to be made aware of critical Hgb 6.6 (previous 8.0 on 03/24) and WBC 38.4 (previous 17.3) Labs were drawn x2 to verify results. No blood seen per report and patient/family. No BM since admit. OK per MD Aileen Fass to give AM Eliquis. Will continue to monitor closely.  ?

## 2021-11-16 NOTE — Progress Notes (Signed)
Patient had a medium, smooth/formed, black/very dark brown, sticky BM in [facility]. Occult sample sent to lab.  ?

## 2021-11-17 DIAGNOSIS — E11319 Type 2 diabetes mellitus with unspecified diabetic retinopathy without macular edema: Secondary | ICD-10-CM | POA: Diagnosis not present

## 2021-11-17 DIAGNOSIS — I10 Essential (primary) hypertension: Secondary | ICD-10-CM | POA: Diagnosis not present

## 2021-11-17 DIAGNOSIS — U071 COVID-19: Secondary | ICD-10-CM

## 2021-11-17 DIAGNOSIS — D62 Acute posthemorrhagic anemia: Secondary | ICD-10-CM | POA: Insufficient documentation

## 2021-11-17 DIAGNOSIS — I5043 Acute on chronic combined systolic (congestive) and diastolic (congestive) heart failure: Secondary | ICD-10-CM

## 2021-11-17 DIAGNOSIS — J9601 Acute respiratory failure with hypoxia: Secondary | ICD-10-CM | POA: Diagnosis not present

## 2021-11-17 DIAGNOSIS — K5792 Diverticulitis of intestine, part unspecified, without perforation or abscess without bleeding: Secondary | ICD-10-CM

## 2021-11-17 DIAGNOSIS — E871 Hypo-osmolality and hyponatremia: Secondary | ICD-10-CM

## 2021-11-17 DIAGNOSIS — I214 Non-ST elevation (NSTEMI) myocardial infarction: Secondary | ICD-10-CM

## 2021-11-17 DIAGNOSIS — J159 Unspecified bacterial pneumonia: Secondary | ICD-10-CM

## 2021-11-17 DIAGNOSIS — J189 Pneumonia, unspecified organism: Secondary | ICD-10-CM | POA: Diagnosis not present

## 2021-11-17 DIAGNOSIS — K922 Gastrointestinal hemorrhage, unspecified: Secondary | ICD-10-CM

## 2021-11-17 LAB — CBC
HCT: 23.2 % — ABNORMAL LOW (ref 39.0–52.0)
Hemoglobin: 8.1 g/dL — ABNORMAL LOW (ref 13.0–17.0)
MCH: 31 pg (ref 26.0–34.0)
MCHC: 34.9 g/dL (ref 30.0–36.0)
MCV: 88.9 fL (ref 80.0–100.0)
Platelets: 210 10*3/uL (ref 150–400)
RBC: 2.61 MIL/uL — ABNORMAL LOW (ref 4.22–5.81)
RDW: 15.2 % (ref 11.5–15.5)
WBC: 23.1 10*3/uL — ABNORMAL HIGH (ref 4.0–10.5)
nRBC: 0.7 % — ABNORMAL HIGH (ref 0.0–0.2)

## 2021-11-17 LAB — GLUCOSE, CAPILLARY
Glucose-Capillary: 162 mg/dL — ABNORMAL HIGH (ref 70–99)
Glucose-Capillary: 256 mg/dL — ABNORMAL HIGH (ref 70–99)
Glucose-Capillary: 255 mg/dL — ABNORMAL HIGH (ref 70–99)
Glucose-Capillary: 217 mg/dL — ABNORMAL HIGH (ref 70–99)

## 2021-11-17 LAB — BASIC METABOLIC PANEL
Anion gap: 6 (ref 5–15)
BUN: 30 mg/dL — ABNORMAL HIGH (ref 8–23)
CO2: 36 mmol/L — ABNORMAL HIGH (ref 22–32)
Calcium: 8 mg/dL — ABNORMAL LOW (ref 8.9–10.3)
Chloride: 86 mmol/L — ABNORMAL LOW (ref 98–111)
Creatinine, Ser: 0.79 mg/dL (ref 0.61–1.24)
GFR, Estimated: >60 mL/min (ref >60)
Glucose, Bld: 210 mg/dL — ABNORMAL HIGH (ref 70–99)
Potassium: 3.9 mmol/L (ref 3.5–5.1)
Sodium: 128 mmol/L — ABNORMAL LOW (ref 135–145)

## 2021-11-17 MED ORDER — FUROSEMIDE 10 MG/ML IJ SOLN
40.0000 mg | Freq: Two times a day (BID) | INTRAMUSCULAR | Status: AC
  Administered 2021-11-17: 40 mg via INTRAVENOUS
  Filled 2021-11-17 (×2): qty 4

## 2021-11-17 MED ORDER — PREDNISONE 5 MG PO TABS
15.0000 mg | ORAL_TABLET | Freq: Every day | ORAL | Status: DC
  Administered 2021-11-17: 15 mg via ORAL
  Filled 2021-11-17: qty 3

## 2021-11-17 MED ORDER — FUROSEMIDE 10 MG/ML IJ SOLN: 40.0000 mg | Freq: Two times a day (BID) | INTRAMUSCULAR | Status: DC

## 2021-11-17 MED ORDER — PIPERACILLIN-TAZOBACTAM IN DEX 2-0.25 GM/50ML IV SOLN
2.2500 g | Freq: Four times a day (QID) | INTRAVENOUS | Status: DC
Start: 2021-11-17 — End: 2021-11-17

## 2021-11-17 MED ORDER — PIPERACILLIN-TAZOBACTAM 3.375 G IVPB
3.3750 g | Freq: Three times a day (TID) | INTRAVENOUS | Status: DC
  Administered 2021-11-17 – 2021-11-20 (×10): 3.375 g via INTRAVENOUS
  Filled 2021-11-17 (×11): qty 50

## 2021-11-17 MED ORDER — POLYETHYLENE GLYCOL 3350 17 G PO PACK: 17.0000 g | PACK | Freq: Two times a day (BID) | ORAL | Status: DC

## 2021-11-17 MED ORDER — LEVALBUTEROL HCL 0.63 MG/3ML IN NEBU
0.6300 mg | INHALATION_SOLUTION | Freq: Two times a day (BID) | RESPIRATORY_TRACT | Status: DC
  Administered 2021-11-18: 0.63 mg via RESPIRATORY_TRACT
  Filled 2021-11-17: qty 3

## 2021-11-17 NOTE — Progress Notes (Signed)
[facility] ?PROGRESS NOTE ? ? ? ?Progress Note  ?Phillip Jordan  OZY:[id number] DOB: 1930-03-12 DOA: 10/28/2021 ?PCP: Colon Branch, MD  ? ? ? ?Brief Narrative:  ? ?Phillip Jordan is an 88 y.o. 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 ? ? ?Assessment/Plan:  ? ?Acute respiratory failure with hypoxia (HCC) due to COVID-19 pneumonia: ?This morning he is requiring 2 L of oxygen to keep saturations greater than the 90% after IV Lasix. ?Try to wean him to room air, out of bed to chair, continue chest physiotherapy incentive spirometry and flutter valve. ?Currently on steroid taper. ?Has completed 5-day course of IV remdesivir.   ?He also received Actemra on 11/10/2021. ?Chest x-ray on 11/16/2021 showed no acute findings. ?He completed a course of IV Rocephin and azithromycin due to concerns of bacterial pneumonia last dose on 11/12/2021 ?He was given Lasix yesterday. ? ?NSTEMI: ?Continue aspirin and beta-blocker.  Hold Eliquis due to any GI bleed . ?Cardiology recommended against Plavix as he is high risk ?Cardiology was consulted and planning for cardiac cath on an outpatient basis. ? ?Acute blood loss normocytic anemia/lower GI bleed in the setting of acute diverticulitis: ?Due to sudden drop in hemoglobin he was transfused 2 units packed red blood cells his hemoglobin this morning is 9.5. FOBT was positive ?CT scan of the abdomen and pelvis showed acute diverticulitis, he related a large bowel movement this morning which was melanotic. ?Hold Eliquis, will discuss with cardiology how to proceed with antiplatelet therapy and  anticoagulation. ? ?Leukocytosis likely due to acute diverticulitis: ?CT scan of the abdomen and pelvis  proximal early sigmoid colon acute diverticulitis.  There is moderate staining rectal in the rectum.  Wall thickening and stranding of the second and third portion of duodenum, probable consistent with inflammatory duodenitis no perforation and evidence of nonspecific jejunal enteritis without inflammatory stranding. ?CT scan also shows significant body wall anasarca along the flanks buttock and upper thigh ?Started empirically on IV Zosyn. ?Started on MiraLAX p.o. twice daily ?He will probably need a colonoscopy in 4 to 6 weeks after resolution of acute diverticulitis. ? ?Accessible atrial fibrillation/sick sinus syndrome status post pacemaker: ?Currently on metoprolol rate controlled AV paced. ?Holding Eliquis. ? ?Hypervolemic hyponatremia: ?He is third spacing on physical exam, confirmed by CT scan ?On presentation 119, this morning is 128 after 2 units of packed red blood cells and IV Lasix. ?He also has hyperglycemia which could be contributing to his hyponatremia. ? ?Chronic combined diastolic and systolic heart failure: ?Last 2D echo showed an EF of 45% with left ventricular function mildly reduced with global hypokinesia in the setting probable NSTEMI. ?We will go ahead and fluid restrict him start him. ?Start on low-dose IV Lasix monitor strict I's and O's Daily weights. ?Recheck basic metabolic panel tomorrow morning. ? ? ?Type 2 diabetes, controlled, with retinopathy ([place]) ?Worsened due to steroids, currently on long-acting insulin plus sliding scale. ? ?Hypokalemia: ?It was repleted orally now resolved. ? ? ?DVT prophylaxis: SCDs ?Family Communication:son ?Status is: Inpatient ?Remains inpatient appropriate because: Acute respiratory failure with hypoxia. ? ? ? ?Code Status:  ? ?  ?Code Status Orders  ?(From admission, onward)  ?  ? ? ?  ? ?  Start     Ordered  ? 11/09/21 1653  Do not attempt  resuscitation (DNR)  Continuous       ?Question Answer Comment  ?In the event of cardiac or respiratory ARREST Do not call a ?code blue?   ?In the event of cardiac or respiratory ARREST Do not perform Intubation, CPR, defibrillation or ACLS   ?In the event of cardiac or respiratory ARREST Use medication by any route, position, wound care, and other measures to relive pain and suffering. May use oxygen, suction and manual treatment of airway obstruction as needed for comfort.   ?  ? 11/09/21 1652  ? ?  ?  ? ?  ? ?Code Status History   ? ? Date Active Date Inactive Code Status Order ID Comments User Context  ? 11/09/2021 0023 11/09/2021 1652 Full Code [id number]  Rise Patience, MD Inpatient  ? 11/06/2020 1305 11/06/2020 2318 Full Code [id number]  Evans Lance, MD Inpatient  ? 11/06/2020 1305 11/06/2020 1305 Full Code [id number]  Baldwin Jamaica, PA-C Inpatient  ? ?  ? ?Advance Directive Documentation   ? ?Flowsheet Row Most Recent Value  ?Type of Advance Directive Healthcare Power of Attorney  ?Pre-existing out of facility DNR order (yellow form or pink MOST form) --  ?"MOST" Form in Place? --  ? ?  ? ? ? ? ?IV Access:  ? ?Peripheral IV ? ? ?Procedures and diagnostic studies:  ? ?CT ABDOMEN PELVIS WO CONTRAST ? ?Result Date: 11/16/2021 ?CLINICAL DATA:  Abdominal pain. EXAM: CT ABDOMEN AND PELVIS WITHOUT CONTRAST TECHNIQUE: Multidetector CT imaging of the abdomen and pelvis was performed following the standard protocol without IV contrast. RADIATION DOSE REDUCTION: This exam was performed according to the departmental dose-optimization program which includes automated exposure control, adjustment of the mA and/or kV according to patient size and/or use of iterative reconstruction technique. COMPARISON:  CT with IV contrast 09/28/2007 FINDINGS: Lower chest: Small right-greater-than-left layering pleural effusions are noted. There is adjacent atelectasis or consolidation in the lower lobes. There are numerous tiny  calcified granulomas in both lower lung fields, not seen previously and primarily peripherally distributed. Emphysematous and mild chronic changes are again shown. The cardiac size is normal. There is artifact from pacemaker wires in the heart. There is no pericardial effusion. Heavy three-vessel calcific CAD is again shown. Hepatobiliary: No focal abnormality of the unenhanced liver. The gallbladder bile ducts are unremarkable. Pancreas: No mass or ductal dilatation without contrast is seen. Stranding is noted in the pancreaticoduodenal groove but it appears related to the duodenum rather than the pancreas. Laboratory and clinical correlation for early pancreatitis is suggested. A few parenchymal calcifications are again noted posteriorly in the pancreatic head. Spleen: Unremarkable without contrast. Adrenals/Urinary Tract: There is no adrenal mass. There is a 1 cm cyst in the upper lateral right kidney. The unenhanced renal cortex is otherwise unremarkable. There is no urinary stone or obstruction. Bladder is catheterized, contracted and not well seen but could be thickened. Stomach/Bowel: Small hiatal hernia. Unremarkable gastric wall. There is wall thickening and stranding in the descending and proximal third portion of the duodenum. Fold thickening and fluid filling in the jejunum. The small bowel is otherwise unremarkable. The appendix is normal. There is advanced left colonic diverticulosis. A short segment of the proximal sigmoid colon demonstrates wall thickening and faint adjacent stranding consistent with an early acute diverticulitis. There is moderate retained stool in the rectum without wall thickening and no further inflammatory changes are seen. Vascular/Lymphatic:  No lymphadenopathy is seen. There is heavy aortoiliac calcific plaque with displaced intimal calcifications into the lumen again noted in the proximal infrarenal aorta as before, and distal to this a 2.8 cm infrarenal AAA which was  previously 2.5 cm. Reproductive: Prostate upper limits of normal in size. Other: Body wall anasarca extends along the flanks, buttocks and upper thighs. There are mild dependent mesenteric congestive changes in the pelvis and a small volume of po

## 2021-11-17 NOTE — Care Management Important Message (Signed)
Important Message ? ?Patient Details IM Letter placed in Patients room. ?Name: Phillip Jordan ?MRN: [id number] ?Date of Birth: Apr 07, 1930 ? ? ?Medicare Important Message Given:  Yes ? ? ? ? ?Kerin Salen ?11/17/2021, 2:19 PM ?

## 2021-11-17 NOTE — Progress Notes (Signed)
Physical Therapy Treatment ?Patient Details ?Name: Phillip Jordan ?MRN: [id number] ?DOB: 1930-06-23 ?Today's Date: 11/17/2021 ? ? ?History of Present Illness Pt is an 88 year old male admitted with pneumonia due to COVID-19 virus, acute hypoxemic respiratory failure secondary to COVID-19. Initially needed BiPAP due to respiratory distress. Also found with non-STEMI. PMH significant for coronary artery disease, permanent pacemaker, AAA, type 2 diabetes on insulin, COPD. ? ?  ?PT Comments  ? ? Progressing slowly with mobility. Mobility limited by general weakness, dizziness, and decreased activity tolerance. Pt reported dizziness with standing-eyelids blinking quite a bit as if he was trying to focus. Did not visualize any nystagmus and he denied spinning. Dizziness lessened once he was back in bed. Will continue to follow and progress activity as safely able.  ?   ?Recommendations for follow up therapy are one component of a multi-disciplinary discharge planning process, led by the attending physician.  Recommendations may be updated based on patient status, additional functional criteria and insurance authorization. ? ?Follow Up Recommendations ? Home health PT ?  ?  ?Assistance Recommended at Discharge Frequent or constant Supervision/Assistance  ?Patient can return home with the following A little help with walking and/or transfers;A little help with bathing/dressing/bathroom;Assist for transportation;Help with stairs or ramp for entrance;Assistance with cooking/housework ?  ?Equipment Recommendations ? Rolling walker (2 wheels)  ?  ?Recommendations for Other Services   ? ? ?  ?Precautions / Restrictions Precautions ?Precautions: Fall ?Restrictions ?Weight Bearing Restrictions: No  ?  ? ?Mobility ? Bed Mobility ?Overal bed mobility: Needs Assistance ?Bed Mobility: Supine to Sit, Sit to Supine ?  ?  ?Supine to sit: Supervision, HOB elevated ?Sit to supine: Min guard, HOB elevated ?  ?General bed mobility comments:  Increased time. ?  ? ?Transfers ?Overall transfer level: Needs assistance ?Equipment used: Rolling walker (2 wheels) ?Transfers: Sit to/from Stand ?Sit to Stand: Min guard, From elevated surface ?  ?  ?  ?  ?  ?General transfer comment: x2. Stood statically for ~1 minute on 1st stand-pt c/o dizziness so returned to sitting. Assessed BP WNL. Stood a 2nd time, and able to take a short walk around room. Pt continued to report dizziness. ?  ? ?Ambulation/Gait ?Ambulation/Gait assistance: Min assist ?Gait Distance (Feet): 10 Feet ?Assistive device: Rolling walker (2 wheels) ?Gait Pattern/deviations: Step-to pattern, Decreased stride length, Trunk flexed ?  ?  ?  ?General Gait Details: Assist to stabilize pt and manage RW. Distance limited by weakness and dizziness. O2 93% on RA, dyspnea 2/4. ? ? ?Stairs ?  ?  ?  ?  ?  ? ? ?Wheelchair Mobility ?  ? ?Modified Rankin (Stroke Patients Only) ?  ? ? ?  ?Balance Overall balance assessment: Needs assistance ?  ?  ?  ?  ?Standing balance support: Bilateral upper extremity supported, During functional activity, Reliant on assistive device for balance ?Standing balance-Leahy Scale: Poor ?  ?  ?  ?  ?  ?  ?  ?  ?  ?  ?  ?  ?  ? ?  ?Cognition Arousal/Alertness: Awake/alert ?Behavior During Therapy: [facility] for tasks assessed/performed ?Overall Cognitive Status: Within Functional Limits for tasks assessed ?  ?  ?  ?  ?  ?  ?  ?  ?  ?  ?  ?  ?  ?  ?  ?  ?  ?  ?  ? ?  ?Exercises   ? ?  ?General Comments   ?  ?  ? ?  Pertinent Vitals/Pain Pain Assessment ?Pain Assessment: No/denies pain  ? ? ?Home Living   ?  ?  ?  ?  ?  ?  ?  ?  ?  ?   ?  ?Prior Function    ?  ?  ?   ? ?PT Goals (current goals can now be found in the care plan section) Progress towards PT goals: Progressing toward goals ? ?  ?Frequency ? ? ? Min 3X/week ? ? ? ?  ?PT Plan Current plan remains appropriate  ? ? ?Co-evaluation   ?  ?  ?  ?  ? ?  ?AM-PAC PT "6 Clicks" Mobility   ?Outcome Measure ? Help needed turning from your  back to your side while in a flat bed without using bedrails?: A Little ?Help needed moving from lying on your back to sitting on the side of a flat bed without using bedrails?: A Little ?Help needed moving to and from a bed to a chair (including a wheelchair)?: A Little ?Help needed standing up from a chair using your arms (e.g., wheelchair or bedside chair)?: A Little ?Help needed to walk in hospital room?: A Lot ?Help needed climbing 3-5 steps with a railing? : A Lot ?6 Click Score: 16 ? ?  ?End of Session Equipment Utilized During Treatment: Gait belt ?Activity Tolerance: Patient limited by fatigue (limited by dizziness) ?Patient left: in bed;with call bell/phone within reach;with bed alarm set;with family/visitor present ?  ?PT Visit Diagnosis: Unsteadiness on feet (R26.81);Muscle weakness (generalized) (M62.81) ?  ? ? ?Time: 8101-7510 ?PT Time Calculation (min) (ACUTE ONLY): 25 min ? ?Charges:  $Gait Training: 8-22 mins ?$Therapeutic Activity: 8-22 mins          ?          ? ? ? ? ?Doreatha Massed, PT ?Acute Rehabilitation  ?Office: [phone] ?Pager: [phone] ? ?  ? ?

## 2021-11-17 NOTE — Progress Notes (Signed)
Remote pacemaker transmission.   

## 2021-11-17 NOTE — Progress Notes (Addendum)
PHARMACY NOTE:  ANTIMICROBIAL RENAL DOSAGE ADJUSTMENT ? ? ?Current antimicrobial dosage:  zosyn 2.25 gm q6h ? ?Indication: IAI ? ?Renal Function: ? ?Estimated Creatinine Clearance: 68.2 mL/min (by C-G formula based on SCr of 0.79 mg/dL). ?   ?Antimicrobial dosage has been changed to:  zosyn 3.375 gm IV q8h infuse each dose over 4 hours ? ? ? ?Thank you for allowing pharmacy to be a part of this patient's care. ? ?Eudelia Bunch, Pharm.D ?11/17/2021 8:35 AM ? ?

## 2021-11-18 DIAGNOSIS — I5043 Acute on chronic combined systolic (congestive) and diastolic (congestive) heart failure: Secondary | ICD-10-CM

## 2021-11-18 DIAGNOSIS — J159 Unspecified bacterial pneumonia: Secondary | ICD-10-CM

## 2021-11-18 DIAGNOSIS — D62 Acute posthemorrhagic anemia: Secondary | ICD-10-CM | POA: Diagnosis not present

## 2021-11-18 DIAGNOSIS — E871 Hypo-osmolality and hyponatremia: Secondary | ICD-10-CM

## 2021-11-18 DIAGNOSIS — J9601 Acute respiratory failure with hypoxia: Secondary | ICD-10-CM | POA: Diagnosis not present

## 2021-11-18 LAB — CBC
HCT: 21.3 % — ABNORMAL LOW (ref 39.0–52.0)
Hemoglobin: 7.5 g/dL — ABNORMAL LOW (ref 13.0–17.0)
MCH: 31.8 pg (ref 26.0–34.0)
MCHC: 35.2 g/dL (ref 30.0–36.0)
MCV: 90.3 fL (ref 80.0–100.0)
Platelets: 203 10*3/uL (ref 150–400)
RBC: 2.36 MIL/uL — ABNORMAL LOW (ref 4.22–5.81)
RDW: 15.1 % (ref 11.5–15.5)
WBC: 18.6 10*3/uL — ABNORMAL HIGH (ref 4.0–10.5)
nRBC: 0.9 % — ABNORMAL HIGH (ref 0.0–0.2)

## 2021-11-18 LAB — COMPREHENSIVE METABOLIC PANEL
ALT: 46 U/L — ABNORMAL HIGH (ref 0–44)
AST: 41 U/L (ref 15–41)
Albumin: 2.4 g/dL — ABNORMAL LOW (ref 3.5–5.0)
Alkaline Phosphatase: 39 U/L (ref 38–126)
Anion gap: 7 (ref 5–15)
BUN: 20 mg/dL (ref 8–23)
CO2: 34 mmol/L — ABNORMAL HIGH (ref 22–32)
Calcium: 7.3 mg/dL — ABNORMAL LOW (ref 8.9–10.3)
Chloride: 84 mmol/L — ABNORMAL LOW (ref 98–111)
Creatinine, Ser: 0.73 mg/dL (ref 0.61–1.24)
GFR, Estimated: >60 mL/min (ref >60)
Glucose, Bld: 148 mg/dL — ABNORMAL HIGH (ref 70–99)
Potassium: 3.5 mmol/L (ref 3.5–5.1)
Sodium: 125 mmol/L — ABNORMAL LOW (ref 135–145)
Total Bilirubin: 1 mg/dL (ref 0.3–1.2)
Total Protein: 4.1 g/dL — ABNORMAL LOW (ref 6.5–8.1)

## 2021-11-18 LAB — GLUCOSE, CAPILLARY
Glucose-Capillary: 166 mg/dL — ABNORMAL HIGH (ref 70–99)
Glucose-Capillary: 220 mg/dL — ABNORMAL HIGH (ref 70–99)
Glucose-Capillary: 183 mg/dL — ABNORMAL HIGH (ref 70–99)
Glucose-Capillary: 189 mg/dL — ABNORMAL HIGH (ref 70–99)

## 2021-11-18 LAB — PREPARE RBC (CROSSMATCH)

## 2021-11-18 MED ORDER — INSULIN ASPART 100 UNIT/ML IJ SOLN: 0.0000 [IU] | Freq: Every day | INTRAMUSCULAR | Status: DC

## 2021-11-18 MED ORDER — SODIUM CHLORIDE 0.9% IV SOLUTION: Freq: Once | INTRAVENOUS | Status: AC

## 2021-11-18 MED ORDER — ORAL CARE MOUTH RINSE: 15.0000 mL | Freq: Two times a day (BID) | OROMUCOSAL | Status: DC

## 2021-11-18 MED ORDER — FUROSEMIDE 10 MG/ML IJ SOLN
60.0000 mg | Freq: Two times a day (BID) | INTRAMUSCULAR | Status: AC
  Administered 2021-11-18 (×2): 60 mg via INTRAVENOUS
  Filled 2021-11-18 (×2): qty 6

## 2021-11-18 MED ORDER — POTASSIUM CHLORIDE CRYS ER 20 MEQ PO TBCR
40.0000 meq | EXTENDED_RELEASE_TABLET | Freq: Two times a day (BID) | ORAL | Status: AC
  Administered 2021-11-18 (×2): 40 meq via ORAL
  Filled 2021-11-18 (×2): qty 2

## 2021-11-18 MED ORDER — PREDNISONE 5 MG PO TABS
10.0000 mg | ORAL_TABLET | Freq: Every day | ORAL | Status: DC
  Administered 2021-11-18 – 2021-11-20 (×3): 10 mg via ORAL
  Filled 2021-11-18 (×3): qty 2

## 2021-11-18 MED ORDER — INSULIN ASPART 100 UNIT/ML IJ SOLN
0.0000 [IU] | Freq: Three times a day (TID) | INTRAMUSCULAR | Status: DC
  Administered 2021-11-18: 3 [IU] via SUBCUTANEOUS
  Administered 2021-11-18: 5 [IU] via SUBCUTANEOUS
  Administered 2021-11-19: 3 [IU] via SUBCUTANEOUS
  Administered 2021-11-19: 2 [IU] via SUBCUTANEOUS
  Administered 2021-11-20: 8 [IU] via SUBCUTANEOUS
  Administered 2021-11-20: 3 [IU] via SUBCUTANEOUS

## 2021-11-18 MED ORDER — INSULIN ASPART 100 UNIT/ML IJ SOLN
4.0000 [IU] | Freq: Three times a day (TID) | INTRAMUSCULAR | Status: DC
  Administered 2021-11-18 – 2021-11-20 (×6): 4 [IU] via SUBCUTANEOUS

## 2021-11-18 NOTE — Progress Notes (Signed)
[facility] ?PROGRESS NOTE ? ? ? ?Progress Note  ?Phillip Jordan  TDS:[id number] DOB: 06/06/1930 DOA: 10/30/2021 ?PCP: Colon Branch, MD  ? ? ? ?Brief Narrative:  ? ?Phillip Jordan is an 88 y.o. 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 ? ? ?Assessment/Plan:  ? ?Acute respiratory failure with hypoxia (HCC) due to COVID-19 pneumonia: ?This morning he is requiring 2 L of oxygen to keep saturations greater than the 90% after IV Lasix. ?Try to wean him to room air, out of bed to chair, continue chest physiotherapy incentive spirometry and flutter valve. ?Currently on steroid taper. ?Has completed 5-day course of IV remdesivir.   ?He also received Actemra on 11/10/2021. ?Chest x-ray on 11/16/2021 showed no acute findings. ?He completed a course of IV Rocephin and azithromycin due to concerns of bacterial pneumonia last dose on 11/12/2021 ?He was given Lasix yesterday. ? ?NSTEMI: ?Continue  beta-blocker.  Hold Eliquis and Aspirin due to any GI bleed, will reconsult cardiology ?Cardiology recommended against Plavix as he is high risk ?Cardiology was consulted and planning for cardiac cath on an outpatient basis. ? ?Acute blood loss normocytic anemia/lower GI bleed in the setting of acute diverticulitis: ?Hemoglobin is low again today at 7.5 he had multiple bloody bowel movement we will give him an additional 2 units of packed red blood cells just received a total of 4. ?FOBT was positive. ?CT scan of the abdomen pelvis showed acute diverticulitis which is probably the cause of his bleeding ?Hold Eliquis and aspirin, will discuss with cardiology. ?Try to  keep hemoglobin over 9 ? ?Leukocytosis likely due to Acute diverticulitis: ?CT scan of the abdomen and pelvis  proximal early sigmoid colon acute diverticulitis.   ?CT scan also shows significant body wall anasarca along the flanks buttock and upper thigh ?Continue IV Zosyn has remained afebrile leukocytosis improving. ?He will probably need a colonoscopy in 4 to 6 weeks after resolution of acute diverticulitis. ? ?Accessible atrial fibrillation/sick sinus syndrome status post pacemaker: ?Currently on metoprolol rate controlled AV paced. ?Holding Eliquis. ? ?Hypervolemic hyponatremia: ?He is third spacing on physical exam, confirmed by CT scan ?On presentation 119, sodium this morning is 125 he received 2 units of packed red blood cells and IV Lasix.   ?He also has hyperglycemia which could be contributing to his hyponatremia. ? ?Chronic combined diastolic and systolic heart failure: ?Last 2D echo showed an EF of 45% with left ventricular function mildly reduced with global hypokinesia in the setting probable NSTEMI. ?Monitor strict I's and O's Daily weights. ?Recheck basic metabolic panel tomorrow morning. ? ?Type 2 diabetes, controlled, with retinopathy ([place]) ?Weaning down steroids blood glucose improving continue long-acting insulin plus sliding scale. ? ?Hypokalemia: ?3.5 this morning replete orally recheck tomorrow morning. ? ? ?DVT prophylaxis: SCDs ?Family Communication:son ?Status is: Inpatient ?Remains inpatient appropriate because: Acute respiratory failure with hypoxia. ? ? ? ?Code Status:  ? ?  ?Code Status Orders  ?(From admission, onward)  ?  ? ? ?  ? ?  Start     Ordered  ? 11/09/21 1653  Do not attempt resuscitation (DNR)  Continuous       ?Question Answer Comment  ?In the  event of cardiac or respiratory ARREST Do not call a ?code blue?   ?In the event of cardiac or respiratory ARREST Do not perform Intubation, CPR, defibrillation or ACLS   ?In the event of cardiac or respiratory ARREST Use  medication by any route, position, wound care, and other measures to relive pain and suffering. May use oxygen, suction and manual treatment of airway obstruction as needed for comfort.   ?  ? 11/09/21 1652  ? ?  ?  ? ?  ? ?Code Status History   ? ? Date Active Date Inactive Code Status Order ID Comments User Context  ? 11/09/2021 0023 11/09/2021 1652 Full Code [id number]  Rise Patience, MD Inpatient  ? 11/06/2020 1305 11/06/2020 2318 Full Code [id number]  Evans Lance, MD Inpatient  ? 11/06/2020 1305 11/06/2020 1305 Full Code [id number]  Baldwin [place], PA-C Inpatient  ? ?  ? ?Advance Directive Documentation   ? ?Flowsheet Row Most Recent Value  ?Type of Advance Directive Healthcare Power of Attorney  ?Pre-existing out of facility DNR order (yellow form or pink MOST form) --  ?"MOST" Form in Place? --  ? ?  ? ? ? ? ?IV Access:  ? ?Peripheral IV ? ? ?Procedures and diagnostic studies:  ? ?CT ABDOMEN PELVIS WO CONTRAST ? ?Result Date: 11/16/2021 ?CLINICAL DATA:  Abdominal pain. EXAM: CT ABDOMEN AND PELVIS WITHOUT CONTRAST TECHNIQUE: Multidetector CT imaging of the abdomen and pelvis was performed following the standard protocol without IV contrast. RADIATION DOSE REDUCTION: This exam was performed according to the departmental dose-optimization program which includes automated exposure control, adjustment of the mA and/or kV according to patient size and/or use of iterative reconstruction technique. COMPARISON:  CT with IV contrast 09/28/2007 FINDINGS: Lower chest: Small right-greater-than-left layering pleural effusions are noted. There is adjacent atelectasis or consolidation in the lower lobes. There are numerous tiny calcified granulomas in both lower lung fields, not seen previously and primarily peripherally distributed. Emphysematous and mild chronic changes are again shown. The cardiac size is normal. There is artifact from pacemaker wires in the heart. There is no pericardial effusion. Heavy three-vessel  calcific CAD is again shown. Hepatobiliary: No focal abnormality of the unenhanced liver. The gallbladder bile ducts are unremarkable. Pancreas: No mass or ductal dilatation without contrast is seen. Stranding is noted in the pancreaticoduodenal groove but it appears related to the duodenum rather than the pancreas. Laboratory and clinical correlation for early pancreatitis is suggested. A few parenchymal calcifications are again noted posteriorly in the pancreatic head. Spleen: Unremarkable without contrast. Adrenals/Urinary Tract: There is no adrenal mass. There is a 1 cm cyst in the upper lateral right kidney. The unenhanced renal cortex is otherwise unremarkable. There is no urinary stone or obstruction. Bladder is catheterized, contracted and not well seen but could be thickened. Stomach/Bowel: Small hiatal hernia. Unremarkable gastric wall. There is wall thickening and stranding in the descending and proximal third portion of the duodenum. Fold thickening and fluid filling in the jejunum. The small bowel is otherwise unremarkable. The appendix is normal. There is advanced left colonic diverticulosis. A short segment of the proximal sigmoid colon demonstrates wall thickening and faint adjacent stranding consistent with an early acute diverticulitis. There is moderate retained stool in the rectum without wall thickening and no further inflammatory changes are seen. Vascular/Lymphatic: No lymphadenopathy is seen. There is heavy aortoiliac calcific plaque with displaced intimal calcifications into the lumen again noted in the proximal infrarenal aorta as before, and distal to this  a 2.8 cm infrarenal AAA which was previously 2.5 cm. Reproductive: Prostate upper limits of normal in size. Other: Body wall anasarca extends along the flanks, buttocks and upper thighs. There are mild dependent mesenteric congestive changes in the pelvis and a small volume of posterior deep pelvic ascites. There is no free air,  hemorrhage or abscess. There is no incarcerated hernia. Musculoskeletal: Osteopenia and degenerative change lumbar spine, slight lumbar dextroscoliosis. There is severe acquired spinal stenosis at L3-4. Moderate bilater

## 2021-11-18 NOTE — Evaluation (Signed)
Occupational Therapy Evaluation ?Patient Details ?Name: Phillip Jordan ?MRN: [id number] ?DOB: 03/31/1930 ?Today's Date: 11/18/2021 ? ? ?History of Present Illness Pt is an 88 year old male admitted with pneumonia due to COVID-19 virus, acute hypoxemic respiratory failure secondary to COVID-19. Initially needed BiPAP due to respiratory distress. Also found with non-STEMI. PMH significant for coronary artery disease, permanent pacemaker, AAA, type 2 diabetes on insulin, COPD.  ? ?Clinical Impression ?  ?Patient lives at home with spouse and is typically independent with self care tasks, occasional assist with buttoning shirt. Also uses cane for ambulation due to neuropathy. Patient fatigued today with multiple bowel movents and receiving blood transfusion. Seated patient was primarily set up assistance for self care tasks, standing patient needing min A for transfer from chair to bed and total A for peri care after bowel movement due to reliant on upper extremity support. Anticipate patient will progress well with continued mobility during acute stay with recommendation for home health services at D/C at this time. Acute OT to follow.   ?   ? ?Recommendations for follow up therapy are one component of a multi-disciplinary discharge planning process, led by the attending physician.  Recommendations may be updated based on patient status, additional functional criteria and insurance authorization.  ? ?Follow Up Recommendations ? Home health OT  ?  ?Assistance Recommended at Discharge Frequent or constant Supervision/Assistance  ?Patient can return home with the following A little help with walking and/or transfers;A little help with bathing/dressing/bathroom;Assistance with cooking/housework;Help with stairs or ramp for entrance;Assist for transportation ? ?  ?Functional Status Assessment ? Patient has had a recent decline in their functional status and demonstrates the ability to make significant improvements in  function in a reasonable and predictable amount of time.  ?Equipment Recommendations ? BSC/3in1  ?  ?   ?Precautions / Restrictions Precautions ?Precautions: Fall ?Precaution Comments: monitor sats ?Restrictions ?Weight Bearing Restrictions: No  ? ?  ? ?Mobility Bed Mobility ?Overal bed mobility: Needs Assistance ?Bed Mobility: Sit to Supine ?  ?  ?  ?Sit to supine: Min assist, HOB elevated ?  ?General bed mobility comments: To lift legs onto bed ?  ? ? ? ?  ?Balance Overall balance assessment: Needs assistance ?Sitting-balance support: Feet supported ?Sitting balance-Leahy Scale: Good ?  ?  ?Standing balance support: Single extremity supported, Bilateral upper extremity supported ?Standing balance-Leahy Scale: Poor ?  ?  ?  ?  ?  ?  ?  ?  ?  ?  ?  ?  ?   ? ?ADL either performed or assessed with clinical judgement  ? ?ADL Overall ADL's : Needs assistance/impaired ?  ?  ?Grooming: Set up;Sitting ?  ?Upper Body Bathing: Set up;Sitting ?  ?Lower Body Bathing: Minimal assistance;Sitting/lateral leans;Sit to/from stand ?  ?Upper Body Dressing : Set up;Sitting ?  ?Lower Body Dressing: Set up;Minimal assistance;Sitting/lateral leans;Sit to/from stand ?Lower Body Dressing Details (indicate cue type and reason): From seated position patient is able to doff/don socks using figure 4 method. Son states at home this can cause him fatigue. Began education regarding A/E such as sock aid to maybe try next session. In standing patient needing min A to steady ?Toilet Transfer: Minimal assistance;Stand-pivot ?Toilet Transfer Details (indicate cue type and reason): Hand held assistance and min A for safety to pivot from recliner chair back to bed. Patient fatigued and getting blood transfusion ?Toileting- Clothing Manipulation and Hygiene: Total assistance;Sit to/from stand ?Toileting - Clothing Manipulation Details (indicate cue type and reason): Patient  reliant on upper extremity support in standing on furniture, total A for perianal  care. Fatigued ?  ?  ?Functional mobility during ADLs: Minimal assistance ?General ADL Comments: Patient requiring increased assistance with self care tasks due to decreased activity tolerance, balance, safety.  ? ? ? ? ?Pertinent Vitals/Pain Pain Assessment ?Pain Assessment: No/denies pain  ? ? ? ?Hand Dominance Right ?  ?Extremity/Trunk Assessment Upper Extremity Assessment ?Upper Extremity Assessment: Overall WFL for tasks assessed (edematous UEs) ?  ?Lower Extremity Assessment ?Lower Extremity Assessment: Defer to PT evaluation ?  ?  ?  ?Communication Communication ?Communication: No difficulties (hearing aids) ?  ?Cognition Arousal/Alertness: Awake/alert ?Behavior During Therapy: [facility] for tasks assessed/performed ?Overall Cognitive Status: Within Functional Limits for tasks assessed ?  ?  ?  ?  ?  ?  ?  ?  ?  ?  ?  ?  ?  ?  ?  ?  ?  ?  ?  ?   ?   ?   ? ? ?Home Living Family/patient expects to be discharged to:: Private residence ?Living Arrangements: Spouse/significant other ?Available Help at Discharge: Family ?Type of Home: House (townhouse) ?Home Access: Stairs to enter ?Entrance Stairs-Number of Steps: 1 ?Entrance Stairs-Rails: None ?Home Layout: One level (1 step to get into den) ?  ?  ?Bathroom Shower/Tub: Tub/shower unit (small threshold to step over) ?  ?Bathroom Toilet: Handicapped height ?Bathroom Accessibility: Yes ?  ?Home Equipment: Standard Walker;Cane - single point ?  ?  ?  ? ?  ?Prior Functioning/Environment Prior Level of Function : Independent/Modified Independent ?  ?  ?  ?  ?  ?  ?Mobility Comments: SPC baseline. denies falls ?ADLs Comments: sometimes assist with buttoned shirts. history of neuropathy B feet/hands ?  ? ?  ?  ?OT Problem List: Decreased activity tolerance;Impaired balance (sitting and/or standing);Decreased safety awareness;Decreased knowledge of use of DME or AE ?  ?   ?OT Treatment/Interventions: Self-care/ADL training;Balance training;Patient/family education;Therapeutic  activities;DME and/or AE instruction  ?  ?OT Goals(Current goals can be found in the care plan section) Acute Rehab OT Goals ?Patient Stated Goal: Use commode ?OT Goal Formulation: With patient ?Time For Goal Achievement: 12/02/21 ?Potential to Achieve Goals: Good  ?OT Frequency: Min 2X/week ?  ? ?   ?AM-PAC OT "6 Clicks" Daily Activity     ?Outcome Measure Help from another person eating meals?: None ?Help from another person taking care of personal grooming?: A Little ?Help from another person toileting, which includes using toliet, bedpan, or urinal?: A Lot ?Help from another person bathing (including washing, rinsing, drying)?: A Little ?Help from another person to put on and taking off regular upper body clothing?: A Little ?Help from another person to put on and taking off regular lower body clothing?: A Little ?6 Click Score: 18 ?  ?End of Session Nurse Communication: Mobility status ? ?Activity Tolerance: Patient limited by fatigue ?Patient left: in bed;with call bell/phone within reach;with nursing/sitter in room ? ?OT Visit Diagnosis: Unsteadiness on feet (R26.81);Other abnormalities of gait and mobility (R26.89)  ?              ?Time: 9937-1696 ?OT Time Calculation (min): 15 min ?Charges:  OT General Charges ?$OT Visit: 1 Visit ?OT Evaluation ?$OT Eval Low Complexity: 1 Low ? ?Delbert Phenix OT ?OT pager: [phone] ? ?Rosemary Holms ?11/18/2021, 1:52 PM ?

## 2021-11-18 NOTE — Plan of Care (Signed)
  Problem: Activity: Goal: Ability to tolerate increased activity will improve Outcome: Progressing   Problem: Clinical Measurements: Goal: Ability to maintain a body temperature in the normal range will improve Outcome: Progressing   Problem: Respiratory: Goal: Ability to maintain adequate ventilation will improve Outcome: Progressing Goal: Ability to maintain a clear airway will improve Outcome: Progressing   

## 2021-11-18 NOTE — Progress Notes (Signed)
The patient's son said his father feels worse after taking neb treatments. He is wondering if his father has clear breath sounds if we could just give the treatments if he is having trouble breathing. Patient took neb treatment but did not tolerate well. RT will continue to monitor. ?

## 2021-11-18 NOTE — Progress Notes (Signed)
Around 2130, RN in room and pt says "I don't feel good". Asked pt why and pt says "I just feel like I'm slipping away". Pt more lethargic and generally weak than at start of shift but still A/Ox4. Feels SOB, sats 90% RA, placed on 1Liter O2.  ?Night provider notified and rapid response nurse at bedside to assess.  ?Labs ordered and later NP at bedside to see pt and speak with son.  ? ?Pt with congested cough, given PRN robitussin and RN encouraged use of IS and flutter valve during night. Pt sleeping off and on and in morning says he "feel a little better" and pt does appear less lethargic and requested to sit in recliner.  ?

## 2021-11-18 NOTE — Progress Notes (Signed)
? ?Progress Note ? ?Patient Name: Phillip Jordan ?Date of Encounter: 11/18/2021 ? ?[facility] Cardiologist: Jenkins Rouge, MD  ? ?Subjective  ? ?Feeling tired.  Had CP only when coughing. ? ?Inpatient Medications  ?  ?Scheduled Meds: ? arformoterol  15 mcg Nebulization BID  ? Chlorhexidine Gluconate Cloth  6 each Topical Daily  ? furosemide  60 mg Intravenous Q12H  ? insulin aspart  0-15 Units Subcutaneous TID WC  ? insulin aspart  0-5 Units Subcutaneous QHS  ? insulin aspart  4 Units Subcutaneous TID WC  ? insulin detemir  10 Units Subcutaneous Daily  ? isosorbide mononitrate  30 mg Oral Daily  ? mouth rinse  15 mL Mouth Rinse q12n4p  ? metoprolol tartrate  12.5 mg Oral BID  ? potassium chloride  40 mEq Oral BID  ? pravastatin  40 mg Oral q1800  ? predniSONE  10 mg Oral Daily  ? ?Continuous Infusions: ? piperacillin-tazobactam (ZOSYN)  IV Stopped (11/18/21 3532)  ? ?PRN Meds: ?acetaminophen **OR** acetaminophen, chlorpheniramine-HYDROcodone, guaiFENesin-dextromethorphan, metoprolol tartrate, nitroGLYCERIN, prochlorperazine  ? ?Vital Signs  ?  ?Vitals:  ? 11/18/21 0759 11/18/21 0818 11/18/21 0853 11/18/21 0900  ?BP:  (!) 134/43 (!) 119/47 (!) 130/54  ?Pulse:  87 85 85  ?Resp:  '20 18 18  '$ ?Temp:  97.8 ?F (36.6 ?C) (!) 97.5 ?F (36.4 ?C) (!) 97.5 ?F (36.4 ?C)  ?TempSrc:  Oral Oral Oral  ?SpO2: 93% 100% 98% 100%  ?Weight:      ?Height:      ? ? ?Intake/Output Summary (Last 24 hours) at 11/18/2021 1224 ?Last data filed at 11/18/2021 9924 ?Gross per 24 hour  ?Intake 1104.74 ml  ?Output 2200 ml  ?Net -1095.26 ml  ? ? ?  11/15/2021  ?  5:07 AM 11/13/2021  ?  6:44 AM 11/16/2021  ?  8:59 PM  ?Last 3 Weights  ?Weight (lbs) 190 lb 7.6 oz 183 lb 13.8 oz 167 lb 15.9 oz  ?Weight (kg) 86.4 kg 83.4 kg 76.2 kg  ?   ? ?Telemetry  ?  ?ASVP.  PVCs - Personally Reviewed ? ?ECG  ?  ?N/a - Personally Reviewed ? ?Physical Exam  ? ?VS:  BP (!) 130/59   Pulse 74   Temp 98 ?F (36.7 ?C) (Oral)   Resp 16   Ht '5\' 9"'$  (1.753 m)   Wt 86.4 kg    SpO2 100%   BMI 28.13 kg/m?  , BMI Body mass index is 28.13 kg/m?. ?GENERAL:  Well appearing ?HEENT: Pupils equal round and reactive, fundi not visualized, oral mucosa unremarkable ?NECK:  No jugular venous distention, waveform within normal limits, carotid upstroke brisk and symmetric, no bruits, no thyromegaly ?LUNGS:  Clear to auscultation bilaterally ?HEART:  RRR.  PMI not displaced or sustained,S1 and S2 within normal limits, no S3, no S4, no clicks, no rubs, no murmurs ?ABD:  Flat, positive bowel sounds normal in frequency in pitch, no bruits, no rebound, no guarding, no midline pulsatile mass, no hepatomegaly, no splenomegaly ?EXT:  2 plus pulses throughout, 2+ L UE edema.  No LE edema, no cyanosis no clubbing ?SKIN:  No rashes no nodules ?NEURO:  Cranial nerves II through XII grossly intact, motor grossly intact throughout ?PSYCH:  Cognitively intact, oriented to person place and time ? ? ?Labs  ?  ?High Sensitivity Troponin:   ?Recent Labs  ?Lab 11/09/21 ?1525 11/09/21 ?1809 11/10/21 ?0423 11/11/21 ?0740 11/11/21 ?0920  ?TROPONINIHS 1,601* 2,823* 2,683* 2,024* 2,095*  ?   ?  Chemistry ?Recent Labs  ?Lab 11/13/21 ?0110 11/14/21 ?0756 11/15/21 ?1539 11/16/21 ?7619 11/17/21 ?5093 11/18/21 ?0352  ?NA 134* 128* 126* 125* 128* 125*  ?K 3.5 3.3* 4.9 4.4 3.9 3.5  ?CL 99 93* 90* 87* 86* 84*  ?CO2 '30 29 30 29 '$ 36* 34*  ?GLUCOSE 177* 122* 269* 237* 210* 148*  ?BUN 37* 36* 39* 36* 30* 20  ?CREATININE 0.76 0.63 0.75 0.96 0.79 0.73  ?CALCIUM 8.3* 7.6* 7.9* 8.1* 8.0* 7.3*  ?PROT 5.2* 4.5*  --   --   --  4.1*  ?ALBUMIN 2.8* 2.5* 2.5*  --   --  2.4*  ?AST 34 39  --   --   --  41  ?ALT 37 59*  --   --   --  46*  ?ALKPHOS 47 41  --   --   --  39  ?BILITOT 0.4 0.4  --   --   --  1.0  ?GFRNONAA >60 >60 >60 >60 >60 >60  ?ANIONGAP '5 6 6 9 6 7  '$ ?  ?Lipids No results for input(s): CHOL, TRIG, HDL, LABVLDL, LDLCALC, CHOLHDL in the last 168 hours.  ?Hematology ?Recent Labs  ?Lab 11/16/21 ?2040 11/17/21 ?2320 11/18/21 ?0352  ?WBC 32.4*  23.1* 18.6*  ?RBC 3.05* 2.61* 2.36*  ?HGB 9.5* 8.1* 7.5*  ?HCT 26.8* 23.2* 21.3*  ?MCV 87.9 88.9 90.3  ?MCH 31.1 31.0 31.8  ?MCHC 35.4 34.9 35.2  ?RDW 14.5 15.2 15.1  ?PLT 232 210 203  ? ?Thyroid  ?Recent Labs  ?Lab 11/15/21 ?1539  ?TSH 1.407  ?  ?BNPNo results for input(s): BNP, PROBNP in the last 168 hours.  ?DDimer  ?Recent Labs  ?Lab 11/12/21 ?2671  ?DDIMER 0.68*  ?  ? ?Radiology  ?  ?CT ABDOMEN PELVIS WO CONTRAST ? ?Result Date: 11/16/2021 ?CLINICAL DATA:  Abdominal pain. EXAM: CT ABDOMEN AND PELVIS WITHOUT CONTRAST TECHNIQUE: Multidetector CT imaging of the abdomen and pelvis was performed following the standard protocol without IV contrast. RADIATION DOSE REDUCTION: This exam was performed according to the departmental dose-optimization program which includes automated exposure control, adjustment of the mA and/or kV according to patient size and/or use of iterative reconstruction technique. COMPARISON:  CT with IV contrast 09/28/2007 FINDINGS: Lower chest: Small right-greater-than-left layering pleural effusions are noted. There is adjacent atelectasis or consolidation in the lower lobes. There are numerous tiny calcified granulomas in both lower lung fields, not seen previously and primarily peripherally distributed. Emphysematous and mild chronic changes are again shown. The cardiac size is normal. There is artifact from pacemaker wires in the heart. There is no pericardial effusion. Heavy three-vessel calcific CAD is again shown. Hepatobiliary: No focal abnormality of the unenhanced liver. The gallbladder bile ducts are unremarkable. Pancreas: No mass or ductal dilatation without contrast is seen. Stranding is noted in the pancreaticoduodenal groove but it appears related to the duodenum rather than the pancreas. Laboratory and clinical correlation for early pancreatitis is suggested. A few parenchymal calcifications are again noted posteriorly in the pancreatic head. Spleen: Unremarkable without contrast.  Adrenals/Urinary Tract: There is no adrenal mass. There is a 1 cm cyst in the upper lateral right kidney. The unenhanced renal cortex is otherwise unremarkable. There is no urinary stone or obstruction. Bladder is catheterized, contracted and not well seen but could be thickened. Stomach/Bowel: Small hiatal hernia. Unremarkable gastric wall. There is wall thickening and stranding in the descending and proximal third portion of the duodenum. Fold thickening and fluid filling in the jejunum. The  small bowel is otherwise unremarkable. The appendix is normal. There is advanced left colonic diverticulosis. A short segment of the proximal sigmoid colon demonstrates wall thickening and faint adjacent stranding consistent with an early acute diverticulitis. There is moderate retained stool in the rectum without wall thickening and no further inflammatory changes are seen. Vascular/Lymphatic: No lymphadenopathy is seen. There is heavy aortoiliac calcific plaque with displaced intimal calcifications into the lumen again noted in the proximal infrarenal aorta as before, and distal to this a 2.8 cm infrarenal AAA which was previously 2.5 cm. Reproductive: Prostate upper limits of normal in size. Other: Body wall anasarca extends along the flanks, buttocks and upper thighs. There are mild dependent mesenteric congestive changes in the pelvis and a small volume of posterior deep pelvic ascites. There is no free air, hemorrhage or abscess. There is no incarcerated hernia. Musculoskeletal: Osteopenia and degenerative change lumbar spine, slight lumbar dextroscoliosis. There is severe acquired spinal stenosis at L3-4. Moderate bilateral hip DJD right-greater-than-left. IMPRESSION: 1. Wall thickening and faint stranding involving a short-segment of the proximal sigmoid colon consistent with acute diverticulitis. No free air or abscess. Colonoscopy follow-up recommended after treatment to exclude underlying lesion. 2. Moderate  retained stool in the rectum without evidence of wall thickening. 3. Wall thickening and stranding in the second and third portions of the duodenum consistent with inflammatory duodenitis or inflammatory ulcerativ

## 2021-11-19 ENCOUNTER — Encounter (HOSPITAL_COMMUNITY): Payer: Self-pay | Admitting: Internal Medicine

## 2021-11-19 DIAGNOSIS — R195 Other fecal abnormalities: Secondary | ICD-10-CM

## 2021-11-19 DIAGNOSIS — K922 Gastrointestinal hemorrhage, unspecified: Secondary | ICD-10-CM | POA: Diagnosis not present

## 2021-11-19 DIAGNOSIS — U071 COVID-19: Secondary | ICD-10-CM

## 2021-11-19 DIAGNOSIS — I214 Non-ST elevation (NSTEMI) myocardial infarction: Secondary | ICD-10-CM | POA: Diagnosis not present

## 2021-11-19 DIAGNOSIS — K921 Melena: Secondary | ICD-10-CM

## 2021-11-19 DIAGNOSIS — J449 Chronic obstructive pulmonary disease, unspecified: Secondary | ICD-10-CM

## 2021-11-19 DIAGNOSIS — I2510 Atherosclerotic heart disease of native coronary artery without angina pectoris: Secondary | ICD-10-CM

## 2021-11-19 DIAGNOSIS — Z515 Encounter for palliative care: Secondary | ICD-10-CM | POA: Diagnosis not present

## 2021-11-19 DIAGNOSIS — J159 Unspecified bacterial pneumonia: Secondary | ICD-10-CM | POA: Diagnosis not present

## 2021-11-19 DIAGNOSIS — Z7189 Other specified counseling: Secondary | ICD-10-CM

## 2021-11-19 DIAGNOSIS — I5043 Acute on chronic combined systolic (congestive) and diastolic (congestive) heart failure: Secondary | ICD-10-CM | POA: Diagnosis not present

## 2021-11-19 DIAGNOSIS — R933 Abnormal findings on diagnostic imaging of other parts of digestive tract: Secondary | ICD-10-CM

## 2021-11-19 DIAGNOSIS — D62 Acute posthemorrhagic anemia: Secondary | ICD-10-CM | POA: Diagnosis not present

## 2021-11-19 DIAGNOSIS — J9601 Acute respiratory failure with hypoxia: Secondary | ICD-10-CM | POA: Diagnosis not present

## 2021-11-19 DIAGNOSIS — J1282 Pneumonia due to coronavirus disease 2019: Secondary | ICD-10-CM

## 2021-11-19 LAB — TYPE AND SCREEN
ABO/RH(D): B POS
Antibody Screen: NEGATIVE
Unit division: 0
Unit division: 0
Unit division: 0
Unit division: 0

## 2021-11-19 LAB — BPAM RBC
Blood Product Expiration Date: 202304122359
Blood Product Expiration Date: 202304132359
Blood Product Expiration Date: 202304202359
Blood Product Expiration Date: 202304212359
ISSUE DATE / TIME: 202303261149
ISSUE DATE / TIME: 202303261522
ISSUE DATE / TIME: 202303280832
ISSUE DATE / TIME: 202303281252
Unit Type and Rh: 7300
Unit Type and Rh: 7300
Unit Type and Rh: 7300
Unit Type and Rh: 7300

## 2021-11-19 LAB — COMPREHENSIVE METABOLIC PANEL
ALT: 52 U/L — ABNORMAL HIGH (ref 0–44)
AST: 39 U/L (ref 15–41)
Albumin: 2.9 g/dL — ABNORMAL LOW (ref 3.5–5.0)
Alkaline Phosphatase: 46 U/L (ref 38–126)
Anion gap: 8 (ref 5–15)
BUN: 21 mg/dL (ref 8–23)
CO2: 34 mmol/L — ABNORMAL HIGH (ref 22–32)
Calcium: 7.9 mg/dL — ABNORMAL LOW (ref 8.9–10.3)
Chloride: 85 mmol/L — ABNORMAL LOW (ref 98–111)
Creatinine, Ser: 0.91 mg/dL (ref 0.61–1.24)
GFR, Estimated: >60 mL/min (ref >60)
Glucose, Bld: 67 mg/dL — ABNORMAL LOW (ref 70–99)
Potassium: 3.3 mmol/L — ABNORMAL LOW (ref 3.5–5.1)
Sodium: 127 mmol/L — ABNORMAL LOW (ref 135–145)
Total Bilirubin: 1.1 mg/dL (ref 0.3–1.2)
Total Protein: 4.9 g/dL — ABNORMAL LOW (ref 6.5–8.1)

## 2021-11-19 LAB — CBC
HCT: 34.1 % — ABNORMAL LOW (ref 39.0–52.0)
Hemoglobin: 12.2 g/dL — ABNORMAL LOW (ref 13.0–17.0)
MCH: 32.4 pg (ref 26.0–34.0)
MCHC: 35.8 g/dL (ref 30.0–36.0)
MCV: 90.5 fL (ref 80.0–100.0)
Platelets: 216 10*3/uL (ref 150–400)
RBC: 3.77 MIL/uL — ABNORMAL LOW (ref 4.22–5.81)
RDW: 16.1 % — ABNORMAL HIGH (ref 11.5–15.5)
WBC: 15 10*3/uL — ABNORMAL HIGH (ref 4.0–10.5)
nRBC: 0.5 % — ABNORMAL HIGH (ref 0.0–0.2)

## 2021-11-19 LAB — HEMOGLOBIN AND HEMATOCRIT, BLOOD
HCT: 32.7 % — ABNORMAL LOW (ref 39.0–52.0)
Hemoglobin: 11.5 g/dL — ABNORMAL LOW (ref 13.0–17.0)

## 2021-11-19 LAB — LIPASE, BLOOD: Lipase: 86 U/L — ABNORMAL HIGH (ref 11–51)

## 2021-11-19 LAB — GLUCOSE, CAPILLARY
Glucose-Capillary: 83 mg/dL (ref 70–99)
Glucose-Capillary: 150 mg/dL — ABNORMAL HIGH (ref 70–99)
Glucose-Capillary: 169 mg/dL — ABNORMAL HIGH (ref 70–99)
Glucose-Capillary: 102 mg/dL — ABNORMAL HIGH (ref 70–99)

## 2021-11-19 MED ORDER — POTASSIUM CHLORIDE CRYS ER 20 MEQ PO TBCR
40.0000 meq | EXTENDED_RELEASE_TABLET | Freq: Two times a day (BID) | ORAL | Status: AC
  Administered 2021-11-19 – 2021-11-20 (×4): 40 meq via ORAL
  Filled 2021-11-19 (×4): qty 2

## 2021-11-19 MED ORDER — PANTOPRAZOLE SODIUM 40 MG PO TBEC
40.0000 mg | DELAYED_RELEASE_TABLET | Freq: Two times a day (BID) | ORAL | Status: DC
  Administered 2021-11-19: 40 mg via ORAL
  Filled 2021-11-19: qty 1

## 2021-11-19 MED ORDER — PANTOPRAZOLE SODIUM 40 MG IV SOLR
40.0000 mg | INTRAVENOUS | Status: DC
Start: 2021-11-19 — End: 2021-11-21
  Administered 2021-11-19 – 2021-11-20 (×2): 40 mg via INTRAVENOUS
  Filled 2021-11-19 (×2): qty 10

## 2021-11-19 MED ORDER — FUROSEMIDE 10 MG/ML IJ SOLN
40.0000 mg | Freq: Two times a day (BID) | INTRAMUSCULAR | Status: AC
  Administered 2021-11-19 (×2): 40 mg via INTRAVENOUS
  Filled 2021-11-19 (×2): qty 4

## 2021-11-19 NOTE — Consult Note (Signed)
? ? ? ? ? Consultation ? ?Referring Provider:   Dr. Louanne Belton ?Primary Care Physician:  Colon Branch, MD ?Primary Gastroenterologist:  Althia Forts       ?Reason for Consultation:     GI bleed ? ? Impression   ? ? ?Melena without hemodynamic compromise ?HGB 12.2 MCV 90.5 Platelets 216 ?BUN 21 Cr 0.91 ?In the setting of Eliquis- has been on hold x 03/26 ?Has had recent prednisone use ?4 total PRBC, 2 units 03/26 and 03/28.  ?HGB stable this AM, no elevation of BUN ?CT with duodenitis  ? ?Diverticulitis ?Can continue antibiotics ?Last colonoscopy 2007 ?Can consider outpatient colonoscopy the patient's more stable. ? ?Cute respiratory failure with hypoxia secondary to pneumonia due to COVID-19 virus  ?Slowly resolving, currently only on 2 L nasal cannula. ? ?COPD Gold 3 ?Low FEV, recent COVID 19 pneumonia ?2 L nasal cannula ?Has had recent prednisone use ? ?Atrial flutter ?Eliquis outpatient on hold since 11/16/2021 ? ?Non-STEMI ?In setting of COVID-pneumonia ?Medically managed ?Followed by cardiology ?No chest pain at this time ? ?CAD status post stent 2014 ?Was on outpatient low-dose aspirin ? ?Acute on chronic combined systolic and diastolic heart failure ?Echocardiogram 11/09/2021 showed reduced left ventricular function 40-45% was 50 to 55% 2021, global hypokinesis, grade 1 diastolic dysfunction. ?Monitor volume status ? ? ? ? Plan  ? ?-Protonix 40 mg IV BID. ?-Clear liquid diet at midnight, NPO at 8 am for evaluation.  ?--Continue to monitor H&H with transfusion as needed to maintain hemoglobin greater than 8 given cardiac history.  ?Monitor for hypervolemia and give lasix as needed.  ?- At this time after long discussion with the patient and his wife Chong Sicilian about patient's high risk status for endoscopic evaluation with CAD, COPD with recent hypoxic respiratory failure secondary COVID-pneumonia, recent non-STEMI, patient has higher risk for ventilator dependence from anesthesia, respiratory distress, cardiac event  during procedure, we will plan on doing IV Protonix twice daily and close monitoring, patient and wife agree.   ?BUN has remained stable in the last several days, and hemoglobin is good today.  Possible this is more of a catch-up bleed rather than active bleeding.   ?If patient continues to have transfusion dependent anemia or signs of active bleeding, will plan for plan for EGD tomorrow.  ?I thoroughly discussed the procedure to include nature, alternatives, benefits, and risks including but not limited to bleeding, perforation, infection, anesthesia/cardiac and pulmonary complications. Patient provides understanding and gave verbal consent to proceed. ?-Please obtain early AM CBC, LFTs, INR ? ?Thank you for your kind consultation, we will continue to follow. ?       ? HPI:   ?Phillip Jordan is a 88 y.o. male with past medical history significant for coronary artery disease status post stenting 1992 in 2014, type 2 diabetes, stage III COPD, atrial flutter on Eliquis, tachybradycardia syndrome, permanent pacemaker ? ?Patient's been in the hospital since 11/06/2021 for shortness of breath, found to have COVID-pneumonia with hypoxic respiratory failure from recent [place] in January, failed outpatient therapy with molnupiravir.  Patient also had non-STEMI, managed medically due to recent viral illness. ?Patient was requiring BiPAP, currently on 2 L nasal cannula which patient was not on previously at home. ?Echocardiogram 11/09/2021 showed reduced left ventricular function 40-45% was 50 to 55% 2021, global hypokinesis, grade 1 diastolic dysfunction. ? ?Patient's admitting hemoglobin 14.3, had slow trending until 11/14/2021 was 8, then 11/16/2021 was 6.6, received 2 units of PRBC, recovered to 9.5, continue to have  downtrending hemoglobin to 7.5 on 11/18/2021 with blood in the stool.   ?FOBT positive 03/2 01/2022, had multiple bloody bowel movements ?Received 2 more units packed red blood cells on the 28th. ?This morning  hemoglobin 12.2. ? ?He began to have abdominal discomfort 11/16/2021-CT showed acute diverticulitis and duodenitis. ? ?Patient has never had GI bleed in the past. ?Patient's last dose of Eliquis was 11/16/2020. ?Patient's first dose of pantoprazole today. ?Wife Chong Sicilian is here and gives some of the history.  ?Patient has rare reflux. ?No dysphagia, odynophagia. ?Has cough since COVID. ?Patient denies any weight loss, has good appetite other than being here in the hospital.  Denies nausea and vomiting. ?Has normally had bowel movement once a day, since being here patient's had loose bowel movements dark black. ?Has been on steroid taper, has history of steroids as needed for COPD once every 3 months. ?Otherwise denies NSAIDs, EtOH. ?Denies family history of GI malignancy. ?Last colonoscopy 2007 Dr. Sharlett Iles. ? ?11/16/2021 CT abdomen pelvis without contrast for abdominal pain showed short segment proximal sigmoid colon consistent with acute diverticulitis, colonoscopy follow-up recommended for exclusion of underlying lesion.  Moderate retained stool in the rectum without evidence of wall thickening.  Wall thickening in second and third portions of the duodenum consistent with inflammatory duodenitis or inflammatory ulcerative disease.  No penetrating ulcer or free air.  Pancreatic [place] duodenal groove, suggest clinical correlation with pancreatitis.  Body wall anasarca, small volume deep pelvic ascites right greater than left small pleural effusion with atelectasis. ? ? ?Past Medical History:  ?Diagnosis Date  ? Allergic rhinitis   ? Aortic dissection ([place])   ? f/u by cards - AAA with distal dissection.  ? CAD (coronary artery disease)   ? a. s/p MI 1992. b. Prior hx stenting to RCA/Cx. b. 05/2013: [place] s/p DES to distal RCA then staged DES to prox LAD.   ? COPD with asthma ([place])   ? Diabetes mellitus   ? Hyperlipidemia   ? Lichen planus   ? Right midline inferior chest, Dr. [name] Sensing  ? SCC (squamous cell  carcinoma)   ? Shingles   ? ? ?Surgical History:  ?He  has a past surgical history that includes Back surgery (1993); Cardiac catheterization; Mitral valve replacement; left heart catheterization with coronary angiogram (N/A, 06/09/2013); percutaneous coronary stent intervention (pci-s) (N/A, 06/12/2013); Cardioversion (N/A, 09/05/2020); and PACEMAKER IMPLANT (N/A, 11/06/2020). ?Family History:  ?His family history includes Asthma in his mother; Cancer in his mother; Heart disease in his father and mother. ?Social History:  ? reports that he quit smoking about 29 years ago. His smoking use included cigarettes. He has a 58.50 pack-year smoking history. He has never used smokeless tobacco. He reports that he does not drink alcohol and does not use drugs. ? ?Prior to Admission medications   ?Medication Sig Start Date End Date Taking? Authorizing Provider  ?albuterol (PROAIR HFA) 108 (90 Base) MCG/ACT inhaler 2 puffs up to every 4 hours as needed only  if your can't catch your breath 08/12/21  Yes [name], MD  ?apixaban (ELIQUIS) 5 MG TABS tablet Take 1 tablet (5 mg total) by mouth 2 (two) times daily. 07/29/20  Yes [name], MD  ?aspirin EC 81 MG tablet Take 81 mg by mouth every evening. Swallow whole.   Yes [provider]  ?dextromethorphan-guaiFENesin (MUCINEX DM) 30-600 MG 12hr tablet Take 1 tablet by mouth 2 (two) times daily as needed for cough.   Yes [provider]  ?  fluticasone (FLONASE) 50 MCG/ACT nasal spray Place 2 sprays into both nostrils daily as needed for allergies or rhinitis.   Yes [provider]  ?fluticasone (FLOVENT HFA) 220 MCG/ACT inhaler Inhale 2 puffs into the lungs 2 (two) times daily. 10/06/21  Yes Hunsucker, Bonna Gains, MD  ?isosorbide mononitrate (IMDUR) 30 MG 24 hr tablet Take 1 tablet by mouth twice daily ?Patient taking differently: Take 30 mg by mouth daily. 07/11/21  Yes [name], [name], MD  ?LAGEVRIO 200 MG CAPS capsule Take 4 capsules by mouth every  12 (twelve) hours. 11/07/21  Yes [provider]  ?metFORMIN (GLUCOPHAGE) 500 MG tablet Take 1 tablet by mouth once daily with breakfast ?Patient taking differently: Take 500 mg by mouth daily with bre

## 2021-11-19 NOTE — Consult Note (Signed)
? ?                                                                                ?Consultation Note ?Date: 11/19/2021  ? ?Patient Name: Phillip Jordan  ?DOB: [birth year]  MRN: [id number]  Age / Sex: 88 y.o., male  ?PCP: Colon Branch, MD ?Referring Physician: Flora Lipps, MD ? ?Reason for Consultation: Establishing goals of care ? ?HPI/Patient Profile: 88 y.o. male admitted on 11/16/2021   ? ?Clinical Assessment and Goals of Care: ? Phillip Jordan is an 88 y.o. 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 He was diagnosed with COVID-19 on 09/08/2021 and he did follow-up with his PCP was started on Molnupiravir but did not improve so he came into the ED. Patient was admitted to hospital medicine service for acute hypoxic respiratory failure due to COVID-19 pneumonia, he had an NSTEMI possible GI bleed and acute diverticulitis complicating his hospital course.  ?A palliative consult has been requested for ongoing goals of care discussions.  ?The patient is resting in his room, is able to work with PT OT and is able to move from bed to chair, wife Tessie Fass is at bedside.  ?Palliative medicine is specialized medical care for people living with serious illness. It focuses on providing relief from the symptoms and stress of a serious illness. The goal is to improve quality of life for both the patient and the family. ?Goals of care: Broad aims of medical therapy in relation to the patient's values and preferences. Our aim is to provide medical care aimed at enabling patients to achieve the goals that matter most to them, given the circumstances of their particular medical situation and their constraints.  ?Goals wishes and values important discussed. Talked about his current health  condition. Brief life review performed.  ?Patient is a English as a second language teacher and he was independent prior to this hospitalization, he used to use a cane when he went outside, he would prefer to be discharged home rather than SNF rehab.  ? ?NEXT OF KIN ? Lives at home with wife, has son who live son who lives in [place], who is currently in town, and also 2 daughters.  ? ?SUMMARY OF RECOMMENDATIONS   ? Agree with DNR ?Goals of care discussions: continue current mode of care, home with home based PT OT and home based palliative support has been discussed today.  ?Wife requests for [facility] consult.  ? ?Code Status/Advance Care Planning: ?DNR ? ? ?Symptom Management:  ?  ? ?Palliative Prophylaxis:  ?Delirium Protocol ? ?Psycho-social/Spiritual:  ?Desire for further Chaplaincy support:yes ?Additional Recommendations: TOC consult, as per patient's wife's wishes.  ? ?Prognosis:  ?Unable to determine ? ?Discharge Planning: Home with Home Health  ? ?  ? ?Primary Diagnoses: ?Present on Admission: ? Acute respiratory failure with hypoxia ([place]) ? Type 2 diabetes, controlled, with retinopathy ([place]) ? CAD (coronary artery disease) ? COPD GOLD III ? Hypertension, essential ? ? ?I have reviewed the medical record, interviewed the patient  and family, and examined the patient. The following aspects are pertinent. ? ?Past Medical History:  ?Diagnosis Date  ? Allergic rhinitis   ? Aortic dissection ([place])   ? f/u by cards - AAA with distal dissection.  ? CAD (coronary artery disease)   ? a. s/p MI 1992. b. Prior hx stenting to RCA/Cx. b. 05/2013: [place] s/p DES to distal RCA then staged DES to prox LAD.   ? COPD with asthma ([place])   ? Diabetes mellitus   ? Hyperlipidemia   ? Lichen planus   ? Right midline inferior chest, Dr. [name] Sensing  ? SCC (squamous cell carcinoma)   ? Shingles   ? ?Social History  ? ?Socioeconomic History  ? Marital status: Married  ?  Spouse name: Not on file  ? Number of children: 3  ? Years of education: Not on file  ? Highest  education level: Not on file  ?Occupational History  ? Occupation: retired  ?Tobacco Use  ? Smoking status: Former  ?  Packs/day: 1.50  ?  Years: 39.00  ?  Pack years: 58.50  ?  Types: Cigarettes  ?  Quit date: 08/24/1992  ?  Years since quitting: 29.2  ? Smokeless tobacco: Never  ?Vaping Use  ? Vaping Use: Never used  ?Substance and Sexual Activity  ? Alcohol use: No  ? Drug use: No  ? Sexual activity: Not on file  ?Other Topics Concern  ? Not on file  ?Social History Narrative  ? Lives w/ wife   ? 3 kids, 11 Gk-GGK  ? ?Social Determinants of Health  ? ?Financial Resource Strain: Low Risk   ? Difficulty of Paying Living Expenses: Not very hard  ?Food Insecurity: No Food Insecurity  ? Worried About Charity fundraiser in the Last Year: Never true  ? Ran Out of Food in the Last Year: Never true  ?Transportation Needs: No Transportation Needs  ? Lack of Transportation (Medical): No  ? Lack of Transportation (Non-Medical): No  ?Physical Activity: Sufficiently Active  ? Days of Exercise per Week: 5 days  ? Minutes of Exercise per Session: 30 min  ?Stress: No Stress Concern Present  ? Feeling of Stress : Not at all  ?Social Connections: Moderately Integrated  ? Frequency of Communication with Friends and Family: Twice a week  ? Frequency of Social Gatherings with Friends and Family: Twice a week  ? Attends Religious Services: More than 4 times per year  ? Active Member of Clubs or Organizations: No  ? Attends Archivist Meetings: Never  ? Marital Status: Married  ? ?Family History  ?Problem Relation Age of Onset  ? Asthma Mother   ? Heart disease Mother   ?     CHF  ? Cancer Mother   ?     melanoma  ? Heart disease Father   ?     MI  ? Prostate cancer Neg Hx   ? Colon cancer Neg Hx   ? ?Scheduled Meds: ? arformoterol  15 mcg Nebulization BID  ? Chlorhexidine Gluconate Cloth  6 each Topical Daily  ? furosemide  40 mg Intravenous BID  ? insulin aspart  0-15 Units Subcutaneous TID WC  ? insulin aspart  0-5 Units  Subcutaneous QHS  ? insulin aspart  4 Units Subcutaneous TID WC  ? insulin detemir  10 Units Subcutaneous Daily  ? isosorbide mononitrate  30 mg Oral Daily  ? mouth rinse  15 mL Mouth Rinse q12n4p  ?  metoprolol tartrate  12.5 mg Oral BID  ? pantoprazole  40 mg Oral BID  ? potassium chloride  40 mEq Oral BID  ? pravastatin  40 mg Oral q1800  ? predniSONE  10 mg Oral Daily  ? ?Continuous Infusions: ? piperacillin-tazobactam (ZOSYN)  IV 3.375 g (11/19/21 0910)  ? ?PRN Meds:.acetaminophen **OR** acetaminophen, chlorpheniramine-HYDROcodone, guaiFENesin-dextromethorphan, metoprolol tartrate, nitroGLYCERIN, prochlorperazine ?Medications Prior to Admission:  ?Prior to Admission medications   ?Medication Sig Start Date End Date Taking? Authorizing Provider  ?albuterol (PROAIR HFA) 108 (90 Base) MCG/ACT inhaler 2 puffs up to every 4 hours as needed only  if your can't catch your breath 08/12/21  Yes [name], MD  ?apixaban (ELIQUIS) 5 MG TABS tablet Take 1 tablet (5 mg total) by mouth 2 (two) times daily. 07/29/20  Yes [name], MD  ?aspirin EC 81 MG tablet Take 81 mg by mouth every evening. Swallow whole.   Yes [provider]  ?dextromethorphan-guaiFENesin (MUCINEX DM) 30-600 MG 12hr tablet Take 1 tablet by mouth 2 (two) times daily as needed for cough.   Yes [provider]  ?fluticasone (FLONASE) 50 MCG/ACT nasal spray Place 2 sprays into both nostrils daily as needed for allergies or rhinitis.   Yes [provider]  ?fluticasone (FLOVENT HFA) 220 MCG/ACT inhaler Inhale 2 puffs into the lungs 2 (two) times daily. 10/06/21  Yes Hunsucker, Bonna Gains, MD  ?isosorbide mononitrate (IMDUR) 30 MG 24 hr tablet Take 1 tablet by mouth twice daily ?Patient taking differently: Take 30 mg by mouth daily. 07/11/21  Yes [name], [name], MD  ?LAGEVRIO 200 MG CAPS capsule Take 4 capsules by mouth every 12 (twelve) hours. 11/07/21  Yes [provider]  ?metFORMIN (GLUCOPHAGE) 500 MG tablet Take 1  tablet by mouth once daily with breakfast ?Patient taking differently: Take 500 mg by mouth daily with breakfast. 10/31/21  Yes Colon Branch, MD  ?metoprolol succinate (TOPROL-XL) 25 MG 24 hr tablet Take 0.5 t

## 2021-11-19 NOTE — Progress Notes (Signed)
Physical Therapy Treatment ?Patient Details ?Name: Phillip Jordan ?MRN: [id number] ?DOB: March 26, 1930 ?Today's Date: 11/19/2021 ? ? ?History of Present Illness Pt is an 88 year old male admitted with pneumonia due to COVID-19 virus, acute hypoxemic respiratory failure secondary to COVID-19. Initially needed BiPAP due to respiratory distress. Also found with non-STEMI. PMH significant for coronary artery disease, permanent pacemaker, AAA, type 2 diabetes on insulin, COPD. ? ?  ?PT Comments  ? ? Pt in bed on 2 lts sats 98%.  Spouse also present.  Assisted OOB while monitoring vitals ?Marland KitchenGeneral bed mobility comments: assist with upper body with supine to sit and then B LE back to bed due to weakness/fatigue. EOB BP 112/61 with slight c/o dizziness. General transfer comment: assisted from elevated bed Min/Min Guard Assist.  Pt with increased c/o dizziness.  standing BP 108/61. General Gait Details: limited distance due to c/o weakness/fatigue.  Spouse following with recliner. BP increased to 127/69 with HR 88 and RA 96%.  Pt tolerated amb 20 feet x 2 with one seated rest break.  Assisted back to bed per pt request.  Reapplied 2 lts nasal.   ?Pt plans to have an EGD tomorrow.   ?Pt plans to D/C to home with spouse. ? ? ?  ?Recommendations for follow up therapy are one component of a multi-disciplinary discharge planning process, led by the attending physician.  Recommendations may be updated based on patient status, additional functional criteria and insurance authorization. ? ?Follow Up Recommendations ? Home health PT ?  ?  ?Assistance Recommended at Discharge Frequent or constant Supervision/Assistance  ?Patient can return home with the following A little help with walking and/or transfers;A little help with bathing/dressing/bathroom;Assist for transportation;Help with stairs or ramp for entrance;Assistance with cooking/housework ?  ?Equipment Recommendations ? Rolling walker (2 wheels)  ?  ?Recommendations for Other  Services   ? ? ?  ?Precautions / Restrictions Precautions ?Precautions: Fall ?Precaution Comments: monitor sats and BP ?Restrictions ?Weight Bearing Restrictions: No  ?  ? ?Mobility ? Bed Mobility ?Overal bed mobility: Needs Assistance ?Bed Mobility: Supine to Sit, Sit to Supine ?  ?  ?Supine to sit: Supervision, Min guard ?Sit to supine: Min guard, Min assist ?  ?General bed mobility comments: assist with upper body with supine to sit and then B LE back to bed due to weakness/fatigue. EOB BP 112/61 with slight c/o dizziness. ?  ? ?Transfers ?Overall transfer level: Needs assistance ?Equipment used: Rolling walker (2 wheels) ?Transfers: Sit to/from Stand ?Sit to Stand: Min guard, From elevated surface, Min assist ?  ?  ?  ?  ?  ?General transfer comment: assisted from elevated bed Min/Min Guard Assist.  Pt with increased c/o dizziness.  standing BP 108/61. ?  ? ?Ambulation/Gait ?Ambulation/Gait assistance: Min assist ?Gait Distance (Feet): 40 Feet (20 feet x 2 one seated rest break) ?Assistive device: Rolling walker (2 wheels) ?Gait Pattern/deviations: Step-to pattern, Decreased stride length, Trunk flexed ?Gait velocity: decreased ?  ?  ?General Gait Details: limited distance due to c/o weakness/fatigue.  Spouse following with recliner. BP increased to 127/69 with HR 88 and RA 96%. ? ? ?Stairs ?  ?  ?  ?  ?  ? ? ?Wheelchair Mobility ?  ? ?Modified Rankin (Stroke Patients Only) ?  ? ? ?  ?Balance   ?  ?  ?  ?  ?  ?  ?  ?  ?  ?  ?  ?  ?  ?  ?  ?  ?  ?  ?  ? ?  ?  Cognition Arousal/Alertness: Awake/alert ?Behavior During Therapy: [facility] for tasks assessed/performed ?Overall Cognitive Status: Within Functional Limits for tasks assessed ?  ?  ?  ?  ?  ?  ?  ?  ?  ?  ?  ?  ?  ?  ?  ?  ?General Comments: AxO x 3 feels "really drained".  Low energy/tired ?  ?  ? ?  ?Exercises   ? ?  ?General Comments   ?  ?  ? ?Pertinent Vitals/Pain Pain Assessment ?Pain Assessment: No/denies pain  ? ? ?Home Living   ?  ?  ?  ?  ?  ?  ?  ?  ?   ?   ?  ?Prior Function    ?  ?  ?   ? ?PT Goals (current goals can now be found in the care plan section) Progress towards PT goals: Progressing toward goals ? ?  ?Frequency ? ? ? Min 3X/week ? ? ? ?  ?PT Plan Current plan remains appropriate  ? ? ?Co-evaluation   ?  ?  ?  ?  ? ?  ?AM-PAC PT "6 Clicks" Mobility   ?Outcome Measure ? Help needed turning from your back to your side while in a flat bed without using bedrails?: A Little ?Help needed moving from lying on your back to sitting on the side of a flat bed without using bedrails?: A Little ?Help needed moving to and from a bed to a chair (including a wheelchair)?: A Little ?Help needed standing up from a chair using your arms (e.g., wheelchair or bedside chair)?: A Little ?Help needed to walk in hospital room?: A Little ?Help needed climbing 3-5 steps with a railing? : A Little ?6 Click Score: 18 ? ?  ?End of Session Equipment Utilized During Treatment: Gait belt ?Activity Tolerance: Patient limited by fatigue ?Patient left: in bed;with call bell/phone within reach;with bed alarm set;with family/visitor present ?Nurse Communication: Mobility status ?PT Visit Diagnosis: Unsteadiness on feet (R26.81);Muscle weakness (generalized) (M62.81) ?  ? ? ?Time: 0737-1062 ?PT Time Calculation (min) (ACUTE ONLY): 34 min ? ?Charges:  $Gait Training: 8-22 mins ?$Therapeutic Activity: 8-22 mins          ?          ? ?{Aniel Hubble  PTA ?Acute  Rehabilitation Services ?Pager      [phone] ?Office      [phone] ? ?

## 2021-11-19 NOTE — Progress Notes (Signed)
[facility] ([facility]) Hospital Liaison Note  Notified by TOC manager of patient/family request for [facility] palliative services at home after discharge.   [facility] hospital liaison will follow patient for discharge disposition.   Please call with any hospice or outpatient palliative care related questions.   Thank you for the opportunity to participate in this patient's care.   Shanita Wicker, LCSW [facility] Hospital Liaison [phone]  

## 2021-11-19 NOTE — TOC Progression Note (Signed)
Transition of Care (TOC) - Progression Note  ? ? ?Patient Details  ?Name: Phillip Jordan ?MRN: [id number] ?Date of Birth: September 28, 1929 ? ?Transition of Care (TOC) CM/SW Contact  ?Dessa Phi, RN ?Phone Number: ?11/19/2021, 3:58 PM ? ?Clinical Narrative: Spoke to patient/spouse in rm about d/c plans-spouse Patsy-agree to HHC-HHPT/OT-will check on [facility] agency to accept;rw,3n1-[facility] to provide & deliver to rm prior d/c. Otpt palliative care services-[facility] rep Shanita received referral.On 02-trying to wean-[facility] following. Spouse prefers to transport home on own.   ? ? ? ?Expected Discharge Plan: [place] ?Barriers to Discharge: Continued Medical Work up ? ?Expected Discharge Plan and Services ?Expected Discharge Plan: [place] ?  ?  ?  ?Living arrangements for the past 2 months: [place] ?                ?  ?  ?  ?  ?  ?  ?  ?  ?  ?  ? ? ?Social Determinants of Health (SDOH) Interventions ?  ? ?Readmission Risk Interventions ?   ? View : No data to display.  ?  ?  ?  ? ? ?

## 2021-11-19 NOTE — Progress Notes (Addendum)
?PROGRESS NOTE ? ? ? ?Phillip Jordan  STM:[id number] DOB: September 08, 1929 DOA: 10/24/2021 ?PCP: Colon Branch, MD  ? ? ?Brief Narrative:  ?Phillip Jordan is an 88 y.o. 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 He was diagnosed with COVID-19 on 09/08/2021 and he did follow-up with his PCP was started on Molnupiravir but did not improve so he came into the ED. ?  ?Assessment/Plan: ? ?Acute respiratory failure with hypoxia  due to COVID-19 pneumonia: ?Currently on 2 L of oxygen.  Continue incentive spirometry flutter valve steroid taper.  Patient has completed 5-day course of IV remdesivir and also received Actemra 11/10/2021.  Chest x-ray on 11/16/2021 showed no acute changes.  Has completed course of IV Rocephin and azithromycin on 11/12/2021.  Receiving IV Lasix.  We will continue to wean oxygen as able. ?  ?NSTEMI: ?Patient did have GI bleed so aspirin and Eliquis on hold.  Continue  Imdur, metoprolol.  Not getting Plavix since patient is high risk.  Cardiology was consulted to his planning for cardiac cath on an outpatient basis.   ?  ?Acute blood loss normocytic anemia/lower GI bleed in the setting of acute diverticulitis: ?Hemoglobin on 3/28 at 7.5.  Had multiple bloody bowel movement and received 2 units of packed RBC.  FOBT was positive.  CT scan of the abdomen pelvis showed acute diverticulitis continue to hold aspirin and Eliquis.  Transfuse to keep hemoglobin more than 9.  Patient stated that he did have a bowel movement yesterday but no bowel movement today.  Continue to monitor closely.  We will consult GI if ongoing bleeding.  Hemoglobin today at 12.2.  We will continue to monitor twice a day.  We will add Protonix as well. ?  ?Leukocytosis likely due to Acute  diverticulitis: ?Continue IV Zosyn.  Patient is afebrile cytosis improving.  CT scan of the abdomen pelvis with anasarca and diverticulitis patient will benefit from colonoscopy in 4 to 6 weeks after resolution of acute diverticulitis.  Will check CBC in AM. ?  ?Paroxysmal atrial fibrillation/sick sinus syndrome status post pacemaker: ?AV paced.  Continue metoprolol.  Hold Eliquis. ? ?Hypervolemic hyponatremia: ?CT scan with anasarca.  Possible third spacing.  Sodium of 127 today.  Received packed RBC and Lasix.  We will continue to monitor closely.  Continue with IV diuresis ? ? Chronic combined diastolic and systolic heart failure: ?Last 2D echo showed an EF of 45% with left ventricular function mildly reduced with global hypokinesia in the setting probable NSTEMI.  Continue to monitor intake and output charting.  Daily weights.  Received IV Lasix during hospitalization.  Continue with IV Lasix today.  Reassess in AM. ?  ?Type 2 diabetes, controlled, with retinopathy ([place]) ?Continue long-acting and sliding scale insulin.  On prednisone 10 mg daily.  Has been weaned down.  Continue to monitor blood glucose levels. ? ?Hypokalemia: ?Hypokalemia.  Potassium of 3.3 today.  We will continue to replenish twice a day for now since the patient is on diuretic.[name]  Check BMP in AM. ? ?Deconditioning debility.  Recently seen by physical therapy who recommended home health PT on discharge. ? ?Goals of care.  Patient is DNR.  Palliative care has been consulted for goals of care.  ? ?  DVT prophylaxis: Place and maintain sequential compression device Start:  11/17/21 0906 ? ? ?Code Status:   ?  Code Status: DNR ? ?Disposition: Home health PT as per physical therapy evaluation ? ?Status is: Inpatient ? ?Remains inpatient appropriate because: Acute respiratory failure with hypoxia, ongoing GI bleed requiring blood transfusion ? ? Family Communication: Spoke with the patient's wife at bedside ? ?Consultants:  ?Cardiology,   ?GI ?Palliative care ? ?Procedures:  ?PRBC transfusion 4 units ? ?Antimicrobials:  ?Zosyn ? ?Anti-infectives (From admission, onward)  ? ? Start     Dose/Rate Route Frequency Ordered Stop  ? 11/17/21 1200  piperacillin-tazobactam (ZOSYN) IVPB 2.25 g  Status:  Discontinued       ? 2.25 g ?100 mL/hr over 30 Minutes Intravenous Every 6 hours 11/17/21 0831 11/17/21 0833  ? 11/17/21 1000  piperacillin-tazobactam (ZOSYN) IVPB 3.375 g       ? 3.375 g ?12.5 mL/hr over 240 Minutes Intravenous Every 8 hours 11/17/21 0834    ? 11/13/21 0600  cefTRIAXone (ROCEPHIN) 2 g in sodium chloride 0.9 % 100 mL IVPB       ? 2 g ?200 mL/hr over 30 Minutes Intravenous Every 24 hours 11/12/21 1315 11/15/21 0554  ? 11/13/21 0100  azithromycin (ZITHROMAX) 500 mg in sodium chloride 0.9 % 250 mL IVPB  Status:  Discontinued       ? 500 mg ?250 mL/hr over 60 Minutes Intravenous Every 24 hours 11/12/21 1315 11/13/21 1313  ? 11/10/21 1000  remdesivir 100 mg in sodium chloride 0.9 % 100 mL IVPB       ?See Hyperspace for full Linked Orders Report.  ? 100 mg ?200 mL/hr over 30 Minutes Intravenous Daily 11/09/21 0808 11/11/21 1040  ? 11/09/21 0930  remdesivir 200 mg in sodium chloride 0.9% 250 mL IVPB       ?See Hyperspace for full Linked Orders Report.  ? 200 mg ?580 mL/hr over 30 Minutes Intravenous Once 11/09/21 0808 11/09/21 1046  ? 11/09/21 0600  cefTRIAXone (ROCEPHIN) 2 g in sodium chloride 0.9 % 100 mL IVPB  Status:  Discontinued       ? 2 g ?200 mL/hr over 30 Minutes Intravenous Every 24 hours 11/09/21 0025 11/12/21 1315  ? 11/09/21 0100  azithromycin (ZITHROMAX) 500 mg in sodium chloride 0.9 % 250 mL IVPB  Status:  Discontinued       ? 500 mg ?250 mL/hr over 60 Minutes Intravenous Every 24 hours 11/09/21 0025 11/12/21 1315  ? 10/28/2021 2115  vancomycin (VANCOCIN) IVPB 1000 mg/200 mL premix       ? 1,000 mg ?200 mL/hr over 60 Minutes Intravenous  Once 10/29/2021 2104 11/01/2021 2303  ? 11/18/2021 2115  ceFEPIme (MAXIPIME) 2 g in sodium chloride 0.9 %  100 mL IVPB       ? 2 g ?200 mL/hr over 30 Minutes Intravenous  Once 10/31/2021 2104 11/10/2021 2233  ? ?  ? ? ?Subjective: ?Today, patient was seen and examined at bedside.  Patient's wife at bedside.  Patient stated that he felt little better and feels a little better with breathing today.  Was able to eat okay.  Did have a bowel movement today but no mention of blood.  He did have bloody bowel movements yesterday.  Received 2 units of packed RBC yesterday.  Cough with productive sputum. ? ?Objective: ?Vitals:  ? 11/18/21 2000 11/19/21 0450 11/19/21 0831 11/19/21 1213  ?BP: 114/69 (!) 106/50  (!) 102/56  ?Pulse: 66 (!) 59  70  ?Resp: '18 18 15 '$ (!) 22  ?Temp: 97.9 ?  F (36.6 ?C) 98 ?F (36.7 ?C)  (!) 97.3 ?F (36.3 ?C)  ?TempSrc:  Oral  Oral  ?SpO2: 97% 100% 95% 97%  ?Weight:      ?Height:      ? ? ?Intake/Output Summary (Last 24 hours) at 11/19/2021 1346 ?Last data filed at 11/19/2021 [phone] ?Gross per 24 hour  ?Intake 964.58 ml  ?Output 4950 ml  ?Net -3985.42 ml  ? ?Filed Weights  ? 11/02/2021 2059 11/13/21 0644 11/15/21 0507  ?Weight: 76.2 kg 83.4 kg 86.4 kg  ? ? ?Physical Examination: ? ?General:  Average built, not in obvious distress, appears weak and deconditioned, with cannula oxygen. ?HENT:   Mild pallor noted oral mucosa is moist.  ?Chest:   Diminished breath sounds bilaterally.  Coarse breath sounds noted. ?CVS: S1 &S2 heard. No murmur.  Regular rate and rhythm. ?Abdomen: Soft, nontender, nondistended.  Bowel sounds are heard.   ?Extremities: No cyanosis, clubbing with bilateral lower extremity edema.  Peripheral pulses are palpable. ?Psych: Alert, awake and oriented, normal mood ?CNS:  No cranial nerve deficits.  Power equal in all extremities.   ?Skin: Warm and dry.  No rashes noted. ? ?Data Reviewed:  ? ?CBC: ?Recent Labs  ?Lab 11/13/21 ?0110 11/14/21 ?0756 11/16/21 ?1062 11/16/21 ?2040 11/17/21 ?2320 11/18/21 ?6948 11/19/21 ?5462  ?WBC 17.7* 17.3* 38.4* 32.4* 23.1* 18.6* 15.0*  ?NEUTROABS 15.0* 11.7*  --   --   --    --   --   ?HGB 10.9* 8.0* 6.6* 9.5* 8.1* 7.5* 12.2*  ?HCT 32.5* 23.1* 19.0* 26.8* 23.2* 21.3* 34.1*  ?MCV 91.5 89.5 92.7 87.9 88.9 90.3 90.5  ?PLT 264 272 327 232 210 203 216  ? ? ?Basic Metabolic Panel: ?Recent Labs  ?[place]

## 2021-11-20 DIAGNOSIS — Z7901 Long term (current) use of anticoagulants: Secondary | ICD-10-CM

## 2021-11-20 DIAGNOSIS — K922 Gastrointestinal hemorrhage, unspecified: Secondary | ICD-10-CM | POA: Diagnosis not present

## 2021-11-20 DIAGNOSIS — J9601 Acute respiratory failure with hypoxia: Secondary | ICD-10-CM | POA: Diagnosis not present

## 2021-11-20 DIAGNOSIS — Z515 Encounter for palliative care: Secondary | ICD-10-CM | POA: Diagnosis not present

## 2021-11-20 DIAGNOSIS — Z7189 Other specified counseling: Secondary | ICD-10-CM | POA: Diagnosis not present

## 2021-11-20 LAB — GLUCOSE, CAPILLARY
Glucose-Capillary: 117 mg/dL — ABNORMAL HIGH (ref 70–99)
Glucose-Capillary: 189 mg/dL — ABNORMAL HIGH (ref 70–99)
Glucose-Capillary: 281 mg/dL — ABNORMAL HIGH (ref 70–99)
Glucose-Capillary: 145 mg/dL — ABNORMAL HIGH (ref 70–99)
Glucose-Capillary: 140 mg/dL — ABNORMAL HIGH (ref 70–99)

## 2021-11-20 LAB — COMPREHENSIVE METABOLIC PANEL
ALT: 44 U/L (ref 0–44)
AST: 28 U/L (ref 15–41)
Albumin: 2.8 g/dL — ABNORMAL LOW (ref 3.5–5.0)
Alkaline Phosphatase: 43 U/L (ref 38–126)
Anion gap: 7 (ref 5–15)
BUN: 24 mg/dL — ABNORMAL HIGH (ref 8–23)
CO2: 34 mmol/L — ABNORMAL HIGH (ref 22–32)
Calcium: 7.9 mg/dL — ABNORMAL LOW (ref 8.9–10.3)
Chloride: 86 mmol/L — ABNORMAL LOW (ref 98–111)
Creatinine, Ser: 1.06 mg/dL (ref 0.61–1.24)
GFR, Estimated: >60 mL/min (ref >60)
Glucose, Bld: 87 mg/dL (ref 70–99)
Potassium: 3.6 mmol/L (ref 3.5–5.1)
Sodium: 127 mmol/L — ABNORMAL LOW (ref 135–145)
Total Bilirubin: 1.1 mg/dL (ref 0.3–1.2)
Total Protein: 4.6 g/dL — ABNORMAL LOW (ref 6.5–8.1)

## 2021-11-20 LAB — PROTIME-INR
INR: 1 (ref 0.8–1.2)
Prothrombin Time: 13.5 seconds (ref 11.4–15.2)

## 2021-11-20 LAB — HEMOGLOBIN AND HEMATOCRIT, BLOOD
HCT: 31.2 % — ABNORMAL LOW (ref 39.0–52.0)
HCT: 29.3 % — ABNORMAL LOW (ref 39.0–52.0)
Hemoglobin: 10.8 g/dL — ABNORMAL LOW (ref 13.0–17.0)
Hemoglobin: 10.1 g/dL — ABNORMAL LOW (ref 13.0–17.0)

## 2021-11-20 LAB — MAGNESIUM: Magnesium: 1.8 mg/dL (ref 1.7–2.4)

## 2021-11-20 LAB — PHOSPHORUS: Phosphorus: 3.8 mg/dL (ref 2.5–4.6)

## 2021-11-20 MED ORDER — LEVALBUTEROL HCL 0.63 MG/3ML IN NEBU
0.6300 mg | INHALATION_SOLUTION | RESPIRATORY_TRACT | Status: DC | PRN
  Administered 2021-11-20: 0.63 mg via RESPIRATORY_TRACT
  Filled 2021-11-20: qty 3

## 2021-11-20 MED ORDER — MORPHINE SULFATE (PF) 2 MG/ML IV SOLN
1.0000 mg | Freq: Once | INTRAVENOUS | Status: AC
  Administered 2021-11-20: 1 mg via INTRAVENOUS
  Filled 2021-11-20: qty 1

## 2021-11-20 MED ORDER — GLUCERNA SHAKE PO LIQD
237.0000 mL | Freq: Three times a day (TID) | ORAL | Status: DC
  Administered 2021-11-20: 237 mL via ORAL
  Filled 2021-11-20 (×3): qty 237

## 2021-11-20 MED ORDER — FUROSEMIDE 10 MG/ML IJ SOLN
40.0000 mg | Freq: Every day | INTRAMUSCULAR | Status: DC
  Administered 2021-11-20: 40 mg via INTRAVENOUS
  Filled 2021-11-20: qty 4

## 2021-11-21 SURGERY — ESOPHAGOGASTRODUODENOSCOPY (EGD) WITH PROPOFOL
Anesthesia: Monitor Anesthesia Care

## 2021-11-21 NOTE — Progress Notes (Signed)
?   11/14/2021 0000  ?Attending Physican Contact  ?Attending Physician (First and Last Name) Flora Lipps, MD  ?Post Mortem Checklist  ?Date of Death 10/23/2021  ?Time of Death 2350  ?Pronounced By Clarene Essex NP, Escher Harr RN, Mandy RN  ?Next of kin notified Yes  ?Name of next of kin notified of death Brek Reece  ?Contact Person's Relationship to Patient Spouse  ?Contact Person's Phone Number [phone]  ?Contact Person's address [employer] [address]  ?Was the patient a No Code Blue or a Limited Code Blue? Yes  ?Patient restrained? Not applicable  ?Height '5\' 9"'$  (1.753 m)  ?[employer] (previously known as [employer] techn[employer])  ?Notification Date 11/14/2021  ?Notification Time 0049  ?[employer] Number [phone]  ?Is patient a potential donor? N  ?Autopsy  ?Autopsy requested by N/A  ?Patient and [place] Returned  ?Patient is satisfied that all belongings have been returned? Yes  ?Valuables returned to? Wife, Tyriq Moragne  ?Specify valuables returned Clothing and wedding ring  ?Notifications  ?Patient Placement notified that Post Mortem checklist is complete Yes  ?Patient Placement notified body transferred Transported to funeral home  ?TO BE FILLED OUT BY PATIENT PLACEMENT ONLY  ?[place] Notified Y  ?FH - Name of person who notified funeral home [place]  ?[place] - Date funeral home notified 11/13/2021  ?[place] - Time funeral home notified [phone]  ?[place] - Name of person at funeral home with whom you spoke Ms. Noberto Retort  ?Medical Examiner  ?Is this a medical examiner's case? N  ?Funeral Home  ?Funeral home name/address/phone # [facility] - [address] [place] - [phone]  ?Planned location of pickup Morgue  ? ? ?

## 2021-11-21 NOTE — Progress Notes (Signed)
Pt unresponsive and apneic.  Only palpable pulse correlates with pacemaker firing.  DNR status confirmed.  Asystole on monitor and confirmed in two leads.  No breath sounds auscultated for full minute.  No heart sounds auscultated for full minute.  Pupils fixed and dilated.  Confirmed by Gershon Cull, NP. Patient pronounced dead at 73. Family at bedside. ?

## 2021-11-21 NOTE — Progress Notes (Addendum)
? ? ?  OVERNIGHT PROGRESS REPORT ? ?Notified by RN that patient has expired at 2350 ? ?Patient was DNR ? ?2 RN verified. ? ?Family was available to RN. ? ? ?Notified by RN for concern of heart rate, Respiratory rate, BP, and mental status change. ?Patient had earlier anxiety and difficulty breathing that was resolved by breathing treatment and medication. ? ?This notification was for rapid decline leading to finding of patient being deceased on arrival to bedside. ? ?Pacemaker noted.  ?No pulse ?No breathing  ?No blood pressure ? ? ?Gershon Cull MSNA ACNPC-AG ?Acute Care Nurse Practitioner ?[facility] ?[place] ? ?

## 2021-11-22 LAB — CULTURE, BLOOD (ROUTINE X 2)
Culture: NO GROWTH
Culture: NO GROWTH
Special Requests: ADEQUATE
Special Requests: ADEQUATE

## 2021-11-22 NOTE — Progress Notes (Signed)
Entered pt room after family informed me that pt was becoming restless again. Got another set of vitals, temp 96.4, BP 156/109, HR 120, RR 26. Pt was restless, tossing and turning, and complaining of SOB and discomfort. ?Pt given 1 mg morphine IV per orders and then became unresponsive with eye fixation. ?NP J. Olena Heckle, charge nurse Ander Purpura, and rapid nurse [facility] notified and came to the bedside. ?Pt was DNR. Pt has a pacemaker, was pacing at 60 bpm. Pt expired at 2350. 2 RN verified. Family at the bedside.  ? ?

## 2021-11-22 NOTE — Progress Notes (Signed)
? ? Progress Note ? ? Subjective  ?Chief Complaint: GI bleed ? ?Patient lying in bed, still on 2 L nasal cannula. ?No bowel movements last night, had 1 small formed black stool this morning. ?Has lower abdominal discomfort. ?Continues to have some shortness of breath, has a little bit more weakness today, states feels harder to perform tasks. ? ? ? Objective  ? ?Vital signs in last 24 hours: ?Temp:  [97.3 ?F (36.3 ?C)-98 ?F (36.7 ?C)] 98 ?F (36.7 ?C) (03/30 8101) ?Pulse Rate:  [70-83] 83 (03/30 0711) ?Resp:  [20-22] 20 (03/30 0711) ?BP: (102-120)/(54-56) 118/54 (03/30 0711) ?SpO2:  [94 %-97 %] 94 % (03/30 0711) ?Last BM Date : 11/19/21 ? ?General:   male in no acute distress  ?Heart:  Regular rate and rhythm; no murmurs ?Pulm: Clear anteriorly; no wheezing ?Abdomen:  Soft, Non-distended AB, Active bowel sounds. mild tenderness in the RLQ. Without guarding and Without rebound, No organomegaly appreciated. ?Extremities:  without  edema, SCDs in place ?Neurologic:  Alert and  oriented x4;  No focal deficits.  ?Psych:  Cooperative. Normal mood and affect. ? ? ?Intake/Output from previous day: ?03/29 0701 - 03/30 0700 ?In: 1080 [P.O.:1080] ?Out: 3300 [Urine:3300] ?Intake/Output this shift: ?No intake/output data recorded. ? ?Lab Results: ?Recent Labs  ?  11/17/21 ?2320 11/18/21 ?0352 11/19/21 ?0832 11/19/21 ?1654 02-Dec-2021 ?0503  ?WBC 23.1* 18.6* 15.0*  --   --   ?HGB 8.1* 7.5* 12.2* 11.5* 10.8*  ?HCT 23.2* 21.3* 34.1* 32.7* 31.2*  ?PLT 210 203 216  --   --   ? ?BMET ?Recent Labs  ?  11/18/21 ?0352 11/19/21 ?0416 December 02, 2021 ?0503  ?NA 125* 127* 127*  ?K 3.5 3.3* 3.6  ?CL 84* 85* 86*  ?CO2 34* 34* 34*  ?GLUCOSE 148* 67* 87  ?BUN 20 21 24*  ?CREATININE 0.73 0.91 1.06  ?CALCIUM 7.3* 7.9* 7.9*  ? ?LFT ?Recent Labs  ?  02-Dec-2021 ?0503  ?PROT 4.6*  ?ALBUMIN 2.8*  ?AST 28  ?ALT 44  ?ALKPHOS 43  ?BILITOT 1.1  ? ?PT/INR ?Recent Labs  ?  Dec 02, 2021 ?0503  ?LABPROT 13.5  ?INR 1.0  ? ? ?Studies/Results: ?No results found. ? ? ?  Impression/Plan:  ? ?Melena without hemodynamic compromise ?HGB 10.8 (12.2)  MCV 90.5 Platelets 216 ?BUN 24 (21)  Cr 1.06 (0.91) ?In the setting of Eliquis- has been on hold x 03/26 ?Has had recent prednisone use, still on 40 mg a day ?4 total PRBC, 2 units 03/26 and 03/28.  ?Slight drop in hemoglobin from 12.2-10.8, still no elevation of BUN ?CT with duodenitis  ?At this time patient continues to have weakness, shortness of breath, appears he is not having an active GI bleed with some formed stools this morning, globin responded well to packed red blood cells.   ?At this time we will continue conservative management, we will still tentatively put patient on for endoscopy tomorrow at 2 pm.  ?Contact GI if any large volume melanic stools  ?Full liquid diet today, n.p.o. 8 AM ?Continue PPI IV twice daily ?Continue to monitor H&H and transfuse for hemoglobin 8 cardiac history.  Monitor for hypervolemia and further transfusions. ?Continues to remain high risk for endoscopic procedures, still off Eliquis, will eventually need to assess when will be appropriate to add back on. ? ?Diverticulitis ?Can continue antibiotics ?Last colonoscopy 2007 ?Can consider outpatient colonoscopy the patient's more stable. ?11/19/2021 WBC 15.0 - down trending ?11/02/2021 Lactic Acid 3.3  ?Repeat CBC in the morning. ?  ?  Cute respiratory failure with hypoxia secondary to pneumonia due to COVID-19 virus  ?Slowly resolving, currently only on 2 L nasal cannula. ?  ?COPD Gold 3 ?Low FEV, recent COVID 19 pneumonia ?2 L nasal cannula ?Has had recent prednisone use- still on 40 mg ?  ?Atrial flutter ?Eliquis outpatient on hold since 11/16/2021 ?  ?Non-STEMI ?In setting of COVID-pneumonia ?Medically managed ?Followed by cardiology ?No chest pain at this time ?  ?CAD status post stent 2014 ?Was on outpatient low-dose aspirin ?  ?Acute on chronic combined systolic and diastolic heart failure ?Echocardiogram 11/09/2021 showed reduced left ventricular  function 40-45% was 50 to 55% 2021, global hypokinesis, grade 1 diastolic dysfunction. ?Monitor volume status ? ?Future Appointments  ?Date Time Provider [place]  ?11/28/2021  9:30 AM [name], MD [facility]  ?12/10/2021  2:15 PM [name], DPM [facility]  ?12/22/2021  1:00 PM [facility] PHARMACIST [facility]  ?01/05/2022  2:45 PM Hunsucker, Bonna Gains, MD [facility] None  ?02/04/2022  7:00 AM [facility] DEVICE REMOTES [facility]  ?05/06/2022  7:00 AM [facility] DEVICE REMOTES [facility]  ? ? ? ? LOS: 12 days  ? ?Vladimir Crofts  12-17-2021, 9:19 AM ? ? ? ?

## 2021-11-22 NOTE — Progress Notes (Signed)
? ?                                                                                                                                                     ?                                                   ?Daily Progress Note  ? ?Patient Name: Phillip Jordan       Date: 11-30-21 ?DOB: Mar 14, 1930  Age: 88 y.o. MRN#: [id number] ?Attending Physician: Flora Lipps, MD ?Primary Care Physician: Colon Branch, MD ?Admit Date: 11/19/2021 ? ?Reason for Consultation/Follow-up: Establishing goals of care ? ?Subjective: ? Awake alert, sitting up in bed, wife at bedside, complains of fatigue, weakness and shortness of breath even with minimal exertion, had a small black bowel movement earlier today, denies abdominal pain.  ? ?Length of Stay: 12 ? ?Current Medications: ?Scheduled Meds:  ? Chlorhexidine Gluconate Cloth  6 each Topical Daily  ? furosemide  40 mg Intravenous Daily  ? insulin aspart  0-15 Units Subcutaneous TID WC  ? insulin aspart  0-5 Units Subcutaneous QHS  ? insulin aspart  4 Units Subcutaneous TID WC  ? insulin detemir  10 Units Subcutaneous Daily  ? isosorbide mononitrate  30 mg Oral Daily  ? mouth rinse  15 mL Mouth Rinse q12n4p  ? metoprolol tartrate  12.5 mg Oral BID  ? pantoprazole (PROTONIX) IV  40 mg Intravenous Q24H  ? potassium chloride  40 mEq Oral BID  ? pravastatin  40 mg Oral q1800  ? predniSONE  10 mg Oral Daily  ? ? ?Continuous Infusions: ? piperacillin-tazobactam (ZOSYN)  IV 3.375 g (11-30-21 0924)  ? ? ?PRN Meds: ?acetaminophen **OR** acetaminophen, chlorpheniramine-HYDROcodone, guaiFENesin-dextromethorphan, metoprolol tartrate, nitroGLYCERIN, prochlorperazine ? ?Physical Exam         ?Generalized weakness ?Regular work of breathing ?Now on Protonix drip through IV ?Some UE edema ?Awake alert oriented, a little hard of hearing but completely awake alert and oriented.  ?Abdomen not tender ? ?Vital Signs: BP (!) 141/67   Pulse 90   Temp 98 ?F (36.7 ?C)   Resp 20   Ht '5\' 9"'$  (1.753 m)   Wt  86.4 kg   SpO2 94%   BMI 28.13 kg/m?  ?SpO2: SpO2: 94 % ?O2 Device: O2 Device: Nasal Cannula ?O2 Flow Rate: O2 Flow Rate (L/min): 2 L/min ? ?Intake/output summary:  ?Intake/Output Summary (Last 24 hours) at 30-Nov-2021 1129 ?Last data filed at 11/19/2021 2206 ?Gross per 24 hour  ?Intake 480 ml  ?Output 3300 ml  ?Net -2820 ml  ? ?LBM: Last BM Date : 11/19/21 ?Baseline Weight: Weight: 76.2 kg ?Most recent weight:  Weight: 86.4 kg ? ?     ?Palliative Assessment/Data: ? ? ? ? ? ?Patient Active Problem List  ? Diagnosis Date Noted  ? NSTEMI (non-ST elevated myocardial infarction) ([place]) 11/17/2021  ? ABLA (acute blood loss anemia) 11/17/2021  ? Lower GI bleed 11/17/2021  ? Hyponatremia 11/17/2021  ? Acute on chronic combined systolic and diastolic CHF (congestive heart failure) ([place]) 11/17/2021  ? Pneumonia due to COVID-19 virus 11/17/2021  ? Bacterial pneumonia 11/17/2021  ? CAP (community acquired pneumonia) 11/09/2021  ? Acute respiratory failure with hypoxemia ([place]) 11/09/2021  ? Acute respiratory failure with hypoxia ([place]) 11/15/2021  ? Pacemaker 02/12/2021  ? Opacities of both lungs present on chest x-ray 01/06/2021  ? Tachycardia-bradycardia syndrome ([place]) 10/11/2020  ? SCC (squamous cell carcinoma) 10/16/2019  ? Diabetic neuropathy ([place]) 05/23/2019  ? Hypertension, essential 09/01/2018  ? COPD exacerbation ([place]) 07/13/2016  ? Sinusitis, chronic 11/08/2015  ? Follow-up --------------PCP NOTES 05/02/2015  ? Vertigo 06/26/2014  ? Palpitations 08/29/2013  ? Hoarseness of voice 06/17/2011  ? Annual physical exam 05/26/2011  ? BUNDLE BRANCH BLOCK, RIGHT 01/10/2009  ? Allergic rhinitis 08/16/2007  ? Type 2 diabetes, controlled, with retinopathy ([place]) 06/30/2007  ? Hyperlipidemia 06/30/2007  ? CAD (coronary artery disease) 06/30/2007  ? Dissection of aorta ([place]) 06/30/2007  ? COPD GOLD III 06/30/2007  ? ? ?Palliative Care Assessment & Plan  ? ?Patient Profile: ?  ? ?Assessment: ? Phillip Jordan is an 88 y.o. male past  medical history significant for coronary artery disease, permanent pacemaker due to tachybradycardia syndrome, AAA, diabetes mellitus type 2 COPD Gold 3 diagnosed with COVID-19, history of atrial flutter status post ablation on Eliquis started experiencing shortness of breath. He was diagnosed with COVID-19 on 09/08/2021 and he did follow-up with his PCP was started on Molnupiravir but did not improve so he came into the ED. Patient was admitted to hospital medicine service for acute hypoxic respiratory failure due to COVID-19 pneumonia, he had an NSTEMI possible GI bleed and acute diverticulitis complicating his hospital course.  ?A palliative consult has been requested for ongoing goals of care discussions.  ? ?Recommendations/Plan: ?DNR DNI but otherwise no limits on care, continue current mode of care.  ?Home with palliative services once patient's acute conditions stable enough to warrant consideration for discharge.  ?Will consult nutrition services for supplements/increasing PO intake, full liquid diet today and he is to be NPO on 11/13/2021.   ? ?Goals of Care and Additional Recommendations: ?Limitations on Scope of Treatment: DNR but other wise full scope treatment.  ? ?Code Status: ? ?  ?Code Status Orders  ?(From admission, onward)  ?  ? ? ?  ? ?  Start     Ordered  ? 11/09/21 1653  Do not attempt resuscitation (DNR)  Continuous       ?Question Answer Comment  ?In the event of cardiac or respiratory ARREST Do not call a ?code blue?   ?In the event of cardiac or respiratory ARREST Do not perform Intubation, CPR, defibrillation or ACLS   ?In the event of cardiac or respiratory ARREST Use medication by any route, position, wound care, and other measures to relive pain and suffering. May use oxygen, suction and manual treatment of airway obstruction as needed for comfort.   ?  ? 11/09/21 1652  ? ?  ?  ? ?  ? ?Code Status History   ? ? Date Active Date Inactive Code Status Order ID Comments User Context  ?  11/09/2021  0023 11/09/2021 1652 Full Code [id number]  Rise Patience, MD Inpatient  ? 11/06/2020 1305 11/06/2020 2318 Full Code [id number]  Evans Lance, MD Inpatient  ? 11/06/2020 1305 11/06/2020 1305 Full Code [id number]  Baldwin Jamaica, PA-C Inpatient  ? ?  ? ?Advance Directive Documentation   ? ?Flowsheet Row Most Recent Value  ?Type of Advance Directive Healthcare Power of Attorney  ?Pre-existing out of facility DNR order (yellow form or pink MOST form) --  ?"MOST" Form in Place? --  ? ?  ? ? ?Prognosis: ? Guarded  ? ?Discharge Planning: ?Home with Palliative Services ? ?Care plan was discussed with patient wife and TR[facility] MD.  ? ?Thank you for allowing the Palliative Medicine Team to assist in the care of this patient. ? ? ?Time In:  10 Time Out: 10.25 Total Time 25 Prolonged Time Billed No   ? ?   ?Greater than 50%  of this time was spent counseling and coordinating care related to the above assessment and plan. ? ?Loistine Chance, MD ? ?Please contact Palliative Medicine Team phone at [phone] for questions and concerns.  ? ? ? ? ? ?

## 2021-11-22 NOTE — Progress Notes (Signed)
?   2021-12-03 2135  ?Assess: MEWS Score  ?Temp (!) 97.5 ?F (36.4 ?C)  ?BP 100/64  ?Pulse Rate (!) 107  ?Resp 20  ?Level of Consciousness Alert  ?SpO2 98 %  ?O2 Device Nasal Cannula  ?O2 Flow Rate (L/min) 2 L/min  ?Assess: MEWS Score  ?MEWS Temp 0  ?MEWS Systolic 1  ?MEWS Pulse 1  ?MEWS RR 0  ?MEWS LOC 0  ?MEWS Score 2  ?MEWS Score Color Yellow  ?Assess: if the MEWS score is Yellow or Red  ?Were vital signs taken at a resting state? Yes  ?Focused Assessment Change from prior assessment (see assessment flowsheet)  ?Does the patient meet 2 or more of the SIRS criteria? No  ?MEWS guidelines implemented *See Row Information* Yes  ?Treat  ?Pain Scale 0-10  ?Pain Score 0  ?Take Vital Signs  ?Increase Vital Sign Frequency  Yellow: Q 2hr X 2 then Q 4hr X 2, if remains yellow, continue Q 4hrs  ?Escalate  ?MEWS: Escalate Yellow: discuss with charge nurse/RN and consider discussing with provider and RRT  ?Notify: Charge Nurse/RN  ?Name of Charge Nurse/RN Notified Lauren  ?Date Charge Nurse/RN Notified 12/03/21  ?Time Charge Nurse/RN Notified 2208  ?Notify: Provider  ?Provider Name/Title Clarene Essex  ?Date Provider Notified 12-03-21  ?Time Provider Notified 2145  ?Notification Type Page  ?Notification Reason Change in status  ?Provider response In department  ?Date of Provider Response 12/03/21  ?Time of Provider Response 2148  ?Notify: Rapid Response  ?Name of Rapid Response RN Notified Leafy Ro  ?Date Rapid Response Notified 12-03-21  ?Time Rapid Response Notified 2020  ?Document  ?Patient Outcome Stabilized after interventions  ?Assess: SIRS CRITERIA  ?SIRS Temperature  0  ?SIRS Pulse 1  ?SIRS Respirations  0  ?SIRS WBC 0  ?SIRS Score Sum  1  ? ? ?

## 2021-11-22 NOTE — Progress Notes (Signed)
Occupational Therapy Treatment ?Patient Details ?Name: Phillip Jordan ?MRN: [id number] ?DOB: July 23, 1930 ?Today's Date: 12/20/21 ? ? ?History of present illness Pt is an 88 year old male admitted with pneumonia due to COVID-19 virus, acute hypoxemic respiratory failure secondary to COVID-19. Initially needed BiPAP due to respiratory distress. Also found with non-STEMI. PMH significant for coronary artery disease, permanent pacemaker, AAA, type 2 diabetes on insulin, COPD. ?  ?OT comments ? Treatment focused on functional mobility needed for household distances - to reach bathroom from different area in the home. Patient found on 2 L at 100%. O2 removed and maintained above 96% at rest. Patient ambulated 4 feet, then 19 feet, then 25 feet. O2 sat dropped to approx 88-89% (dropped lower but pleth signal poor so unsure of quality of reading) after activity with patient apparently winded. O2 sat recovered very quickly. Patient introduced to the concept of energy conservation but needs more education. Patient exhibits continued poor activity tolerance. Cont POC.   ? ?Recommendations for follow up therapy are one component of a multi-disciplinary discharge planning process, led by the attending physician.  Recommendations may be updated based on patient status, additional functional criteria and insurance authorization. ?   ?Follow Up Recommendations ? Home health OT  ?  ?Assistance Recommended at Discharge Frequent or constant Supervision/Assistance  ?Patient can return home with the following ? A little help with walking and/or transfers;A little help with bathing/dressing/bathroom;Assistance with cooking/housework;Help with stairs or ramp for entrance;Assist for transportation ?  ?Equipment Recommendations ? BSC/3in1  ?  ?Recommendations for Other Services   ? ?  ?Precautions / Restrictions Precautions ?Precautions: Fall ?Precaution Comments: monitor sats and BP ?Restrictions ?Weight Bearing Restrictions: No  ? ? ?   ? ?   ?Balance Overall balance assessment: Mild deficits observed, not formally tested ?  ?  ?  ?  ?  ?  ?  ?  ?  ?  ?  ?  ?  ?  ?  ?  ?  ?  ?   ? ?ADL either performed or assessed with clinical judgement  ? ?ADL   ?  ?  ?  ?  ?  ?  ?  ?  ?  ?  ?  ?  ?  ?  ?  ?  ?  ?  ?Functional mobility during ADLs: Min guard;Rolling walker (2 wheels) ?General ADL Comments: In preparation for return home patient worked on functional mobility in order to walk distances in home to reach bathroom for bedroom and living room. Patient supervision to transfer to side of the bed using bed rail to pull up on. Patient able to walk to sink, approx 4 feet, before needing seated rest break. Then ambulated 19 feet, then 25 feet with RW and min guard and chair follow. ?  ? ?Extremity/Trunk Assessment Upper Extremity Assessment ?Upper Extremity Assessment: Overall WFL for tasks assessed ?  ?Lower Extremity Assessment ?Lower Extremity Assessment: Defer to PT evaluation ?  ?  ?  ? ?Vision Baseline Vision/History: 1 Wears glasses ?  ?  ?Perception   ?  ?Praxis   ?  ? ?Cognition Arousal/Alertness: Awake/alert ?Behavior During Therapy: [facility] for tasks assessed/performed ?Overall Cognitive Status: Within Functional Limits for tasks assessed ?  ?  ?  ?  ?  ?  ?  ?  ?  ?  ?  ?  ?  ?  ?  ?  ?General Comments: HOH ?  ?  ?   ?   ?   ?   ? ? ?  Pertinent Vitals/ Pain       Pain Assessment ?Pain Assessment: No/denies pain ? ? ?Frequency ? Min 2X/week  ? ? ? ? ?  ?Progress Toward Goals ? ?OT Goals(current goals can now be found in the care plan section) ? Progress towards OT goals: Progressing toward goals ? ?Acute Rehab OT Goals ?Patient Stated Goal: improve mobility distance to go home ?OT Goal Formulation: With patient ?Time For Goal Achievement: 12/02/21 ?Potential to Achieve Goals: Good  ?Plan Discharge plan remains appropriate   ? ?Co-evaluation ? ? ?   ?  ?  ?  ?  ? ?  ?AM-PAC OT "6 Clicks" Daily Activity     ?Outcome Measure ? ? Help from another person  eating meals?: None ?Help from another person taking care of personal grooming?: A Little ?Help from another person toileting, which includes using toliet, bedpan, or urinal?: A Lot ?Help from another person bathing (including washing, rinsing, drying)?: A Little ?Help from another person to put on and taking off regular upper body clothing?: A Little ?Help from another person to put on and taking off regular lower body clothing?: A Little ?6 Click Score: 18 ? ?  ?End of Session Equipment Utilized During Treatment: Rolling walker (2 wheels) ? ?OT Visit Diagnosis: Unsteadiness on feet (R26.81);Other abnormalities of gait and mobility (R26.89) ?  ?Activity Tolerance Patient tolerated treatment well ?  ?Patient Left in chair;with call bell/phone within reach;with family/visitor present;with chair alarm set ?  ?Nurse Communication  (okay to see) ?  ? ?   ? ?Time: 0630-1601 ?OT Time Calculation (min): 30 min ? ?Charges: OT General Charges ?$OT Visit: 1 Visit ?OT Treatments ?$Therapeutic Activity: 23-37 mins ? ?Phillip Jordan, Phillip Jordan ?Acute Care Rehab Services  ?Office [phone] ?Pager: [phone]  ? ?Phillip Jordan ?12-02-2021, 4:16 PM ?

## 2021-11-22 NOTE — Progress Notes (Signed)
?PROGRESS NOTE ? ? ? ?Phillip Jordan  LKG:[id number] DOB: 15-Jan-1930 DOA: 10/25/2021 ?PCP: Colon Branch, MD  ? ? ?Brief Narrative:  ?Phillip Jordan is an 89 y.o. 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 He was diagnosed with COVID-19 on 09/08/2021 and he did follow-up with his PCP was started on Molnupiravir but did not improve so he came into the ED. ?  ?Assessment/Plan: ? ?Acute respiratory failure with hypoxia  due to COVID-19 pneumonia: ?Currently on 2 L of oxygen.  Continue incentive spirometry, flutter valve, steroid taper.  Patient has completed 5-day course of IV remdesivir and also received Actemra 11/10/2021.  Chest x-ray on 11/16/2021 showed no acute changes.  Has completed course of IV Rocephin and azithromycin on 11/12/2021.    We will continue to wean oxygen as able. ?  ?NSTEMI: ?Patient did have GI bleed so aspirin and Eliquis on hold.  Continue  Imdur, metoprolol.  Not getting Plavix since patient is high risk.  Cardiology was consulted and recommend cardiac cath on an outpatient basis.   ?  ?Acute blood loss normocytic anemia/lower GI bleed in the setting of acute diverticulitis: ?Hemoglobin on 3/28 at 7.5.  Had multiple bloody bowel movement during hospitalization and received 2 units of packed RBC.  FOBT was positive.  CT scan of the abdomen pelvis showed acute diverticulitis continue to hold aspirin and Eliquis.  Transfuse to keep hemoglobin more than 9.  Patient did have 1 large melanotic stool yesterday on 11/19/2021 and GI was consulted.  GI has seen the patient and recommended  IV Protonix for now.  Did have a small melanotic bowel movement today.  Latest hemoglobin of 10.8. ? ?Leukocytosis likely due to Acute diverticulitis/duodenitis: ?Continue IV  Zosyn.  Patient is afebrile.  CT scan of the abdomen pelvis with anasarca and diverticulitis, duodenal thickening, possible chronic pancreatitis.  Will likely benefit from colonoscopy as outpatient for evaluation of diverticulitis f ?  ?Paroxysmal atrial fibrillation/sick sinus syndrome status post pacemaker: ?AV paced.  Continue metoprolol.  Hold Eliquis. ? ?Hypervolemic hyponatremia: ?CT scan with anasarca.  Possible third spacing.  Sodium of 127 >127.  Received packed RBC and Lasix.  We will continue to monitor closely.  Continue with IV diuresis ? ? Chronic combined diastolic and systolic heart failure: ?Last 2D echo showed an EF of 45% with left ventricular function mildly reduced with global hypokinesia in the setting probable NSTEMI.  Continue to monitor intake and output charting.  Daily weights.  Received IV Lasix during hospitalization.  Will redose Lasix as necessary daily.  Patient is negative balance  for 7484 mL. ? ?Type 2 diabetes, controlled, with retinopathy ([place]) ?Continue long-acting and sliding scale insulin.  On prednisone 10 mg daily.  Has been weaned down.  Continue to monitor blood glucose levels.  Latest POC glucose of 117 ? ?Hypokalemia: ?Improved after replacement we will continue to replenish.  Latest potassium was 3.6. ? ?Deconditioning debility.  Recently seen by physical therapy who recommended home health PT on discharge. ? ?Goals of care.  Patient is DNR.  Palliative care on board at this time.  Spoke with palliative care at bedside.  ? ?  DVT prophylaxis: Place and maintain sequential compression device Start: 11/17/21 0906 ? ? ?Code Status:   ?  Code Status: DNR ? ?Disposition:  Home health PT as per physical therapy evaluation ? ?Status is: Inpatient ? ?Remains inpatient appropriate because: Acute respiratory failure with hypoxia, ongoing GI bleed, blood transfusion ? ? Family Communication:  ?I again spoke with the patient's wife at bedside ? ?Consultants:  ?Cardiology,   ?GI ?Palliative care ? ?Procedures:  ?PRBC transfusion 4 units ? ?Antimicrobials:  ?Zosyn ? ? ?Subjective: ?Today, patient was seen and examined at bedside.  Patient's wife at bedside.  Patient states that he did have a small bowel movement which was dark.  Denies dizziness lightheadedness chest pain.  Has cough with congestion and mild shortness of breath. ? ?Objective: ?Vitals:  ? 11/19/21 2205 12/19/2021 0711 12/19/2021 1104 12-19-21 1305  ?BP: (!) 120/56 (!) 118/54 (!) 141/67 (!) 113/59  ?Pulse: 75 83 90 73  ?Resp: '20 20  20  '$ ?Temp: (!) 97.3 ?F (36.3 ?C) 98 ?F (36.7 ?C)  (!) 97.4 ?F (36.3 ?C)  ?TempSrc: Oral   Oral  ?SpO2: 96% 94%  99%  ?Weight:      ?Height:      ? ? ?Intake/Output Summary (Last 24 hours) at 2021-12-19 1319 ?Last data filed at 11/19/2021 2206 ?Gross per 24 hour  ?Intake 480 ml  ?Output 3300 ml  ?Net -2820 ml  ? ?Filed Weights  ? 11/07/2021 2059 11/13/21 0644 11/15/21 0507  ?Weight: 76.2 kg 83.4 kg 86.4 kg  ? ? ?Physical Examination: ? ?General:  Average built, not in obvious distress, on nasal cannula oxygen, appears weak and deconditioned. ?HENT: Mild pallor noted.  Oral mucosa is moist.  ?Chest:   Diminished breath sounds bilaterally.  Coarse breath sounds noted. ?CVS: S1 &S2 heard. No murmur.  Regular rate and rhythm. ?Abdomen: Soft, nontender, nondistended.  Bowel sounds are heard.   ?Extremities: No cyanosis, clubbing with bilateral lower extremity edema..  Peripheral pulses are palpable. ?Psych: Alert, awake and oriented, normal mood ?CNS:  No cranial nerve deficits.  Power equal in all extremities.   ?Skin: Warm and dry.  No rashes noted. ? ? ?Data Reviewed:  ? ?CBC: ?Recent Labs  ?Lab 11/14/21 ?0756 11/16/21 ?9326 11/16/21 ?2040 11/17/21 ?2320 11/18/21 ?0352 11/19/21 ?7124 11/19/21 ?1654 12/19/21 ?0503  ?WBC 17.3* 38.4* 32.4* 23.1* 18.6* 15.0*  --   --   ?NEUTROABS 11.7*  --   --   --   --   --   --   --   ?HGB 8.0* 6.6* 9.5* 8.1* 7.5* 12.2* 11.5* 10.8*  ?HCT 23.1* 19.0* 26.8* 23.2* 21.3*  34.1* 32.7* 31.2*  ?MCV 89.5 92.7 87.9 88.9 90.3 90.5  --   --   ?PLT 272 327 232 210 203 216  --   --   ? ? ?Basic Metabolic Panel: ?Recent Labs  ?Lab 11/15/21 ?1539 11/16/21 ?5809 11/17/21 ?9833 11/18/21 ?8250 11/19/21 ?5397 2021/12/19 ?0503  ?NA 126* 125* 128* 125* 127* 127*  ?K 4.9 4.4 3.9 3.5 3.3* 3.6  ?CL 90* 87* 86* 84* 85* 86*  ?CO2 30 29 36* 34* 34* 34*  ?GLUCOSE 269* 237* 210* 148* 67* 87  ?BUN 39* 36* 30* 20 21 24*  ?CREATININE 0.75 0.96 0.79 0.73 0.91 1.06  ?CALCIUM 7.9* 8.1* 8.0* 7.3* 7.9* 7.9*  ?MG  --   --   --   --   --  1.8  ?PHOS 2.6  --   --   --   --  3.8  ? ? ?Liver Function Tests: ?Recent Labs  ?Lab 11/14/21 ?0756 11/15/21 ?1539 11/18/21 ?0352 11/19/21 ?0416 2021/12/19 ?0503  ?  AST 39  --  41 39 28  ?ALT 59*  --  46* 52* 44  ?ALKPHOS 41  --  39 46 43  ?BILITOT 0.4  --  1.0 1.1 1.1  ?PROT 4.5*  --  4.1* 4.9* 4.6*  ?ALBUMIN 2.5* 2.5* 2.4* 2.9* 2.8*  ? ? ? ?Radiology Studies: ?No results found. ? ? ? LOS: 12 days  ? ? ?Flora Lipps, MD ?[facility] ?12/20/2021, 1:19 PM  ? ? ?

## 2021-11-22 NOTE — Care Management Important Message (Signed)
Important Message ? ?Patient Details IM Letter given to the Patient. ?Name: Phillip Jordan ?MRN: [id number] ?Date of Birth: 1930/08/12 ? ? ?Medicare Important Message Given:  Yes ? ? ? ? ?Kerin Salen ?12/15/2021, 12:27 PM ?

## 2021-11-22 NOTE — Progress Notes (Signed)
Bipap is not needed at this time. Pt is resting well. No resp distress noted. Stable VS. ?

## 2021-11-22 NOTE — Progress Notes (Signed)
? ?Progress Note ? ?Patient Name: Phillip Jordan ?Date of Encounter: 12-01-21 ? ?[facility] Cardiologist: Jenkins Rouge, MD  ? ?Subjective  ? ?Very tired.  Didn't sleep well last night.  +SOB ? ?Inpatient Medications  ?  ?Scheduled Meds: ? Chlorhexidine Gluconate Cloth  6 each Topical Daily  ? feeding supplement (GLUCERNA SHAKE)  237 mL Oral TID BM  ? furosemide  40 mg Intravenous Daily  ? insulin aspart  0-15 Units Subcutaneous TID WC  ? insulin aspart  0-5 Units Subcutaneous QHS  ? insulin aspart  4 Units Subcutaneous TID WC  ? insulin detemir  10 Units Subcutaneous Daily  ? isosorbide mononitrate  30 mg Oral Daily  ? mouth rinse  15 mL Mouth Rinse q12n4p  ? metoprolol tartrate  12.5 mg Oral BID  ? pantoprazole (PROTONIX) IV  40 mg Intravenous Q24H  ? potassium chloride  40 mEq Oral BID  ? pravastatin  40 mg Oral q1800  ? predniSONE  10 mg Oral Daily  ? ?Continuous Infusions: ? piperacillin-tazobactam (ZOSYN)  IV 3.375 g (12-01-21 0924)  ? ?PRN Meds: ?acetaminophen **OR** acetaminophen, chlorpheniramine-HYDROcodone, guaiFENesin-dextromethorphan, metoprolol tartrate, nitroGLYCERIN, prochlorperazine  ? ?Vital Signs  ?  ?Vitals:  ? 11/19/21 1213 11/19/21 2205 01-Dec-2021 0711 Dec 01, 2021 1104  ?BP: (!) 102/56 (!) 120/56 (!) 118/54 (!) 141/67  ?Pulse: 70 75 83 90  ?Resp: (!) '22 20 20   '$ ?Temp: (!) 97.3 ?F (36.3 ?C) (!) 97.3 ?F (36.3 ?C) 98 ?F (36.7 ?C)   ?TempSrc: Oral Oral    ?SpO2: 97% 96% 94%   ?Weight:      ?Height:      ? ? ?Intake/Output Summary (Last 24 hours) at 01-Dec-2021 1242 ?Last data filed at 11/19/2021 2206 ?Gross per 24 hour  ?Intake 480 ml  ?Output 3300 ml  ?Net -2820 ml  ? ? ?  11/15/2021  ?  5:07 AM 11/13/2021  ?  6:44 AM 10/27/2021  ?  8:59 PM  ?Last 3 Weights  ?Weight (lbs) 190 lb 7.6 oz 183 lb 13.8 oz 167 lb 15.9 oz  ?Weight (kg) 86.4 kg 83.4 kg 76.2 kg  ?   ? ?Telemetry  ?  ?ASVP.  PVCs - Personally Reviewed ? ?ECG  ?  ?N/a - Personally Reviewed ? ?Physical Exam  ? ?VS:  BP (!) 141/67   Pulse 90    Temp 98 ?F (36.7 ?C)   Resp 20   Ht '5\' 9"'$  (1.753 m)   Wt 86.4 kg   SpO2 94%   BMI 28.13 kg/m?  , BMI Body mass index is 28.13 kg/m?. ?GENERAL:  Well appearing ?HEENT: Pupils equal round and reactive, fundi not visualized, oral mucosa unremarkable ?NECK:  No jugular venous distention, waveform within normal limits, carotid upstroke brisk and symmetric, no bruits, no thyromegaly ?LUNGS:  Clear to auscultation bilaterally ?HEART:  RRR.  PMI not displaced or sustained,S1 and S2 within normal limits, no S3, no S4, no clicks, no rubs, no murmurs ?ABD:  Flat, positive bowel sounds normal in frequency in pitch, no bruits, no rebound, no guarding, no midline pulsatile mass, no hepatomegaly, no splenomegaly ?EXT:  2 plus pulses throughout, 2+ L UE edema.  No LE edema, no cyanosis no clubbing ?SKIN:  No rashes no nodules ?NEURO:  Cranial nerves II through XII grossly intact, motor grossly intact throughout ?PSYCH:  Cognitively intact, oriented to person place and time ? ? ?Labs  ?  ?High Sensitivity Troponin:   ?Recent Labs  ?Lab 11/09/21 ?1525 11/09/21 ?  1809 11/10/21 ?0423 11/11/21 ?0740 11/11/21 ?0920  ?TROPONINIHS 1,601* 2,823* 4,944* 2,024* 2,095*  ?   ?Chemistry ?Recent Labs  ?Lab 11/18/21 ?9675 11/19/21 ?0416 12-12-21 ?0503  ?NA 125* 127* 127*  ?K 3.5 3.3* 3.6  ?CL 84* 85* 86*  ?CO2 34* 34* 34*  ?GLUCOSE 148* 67* 87  ?BUN 20 21 24*  ?CREATININE 0.73 0.91 1.06  ?CALCIUM 7.3* 7.9* 7.9*  ?MG  --   --  1.8  ?PROT 4.1* 4.9* 4.6*  ?ALBUMIN 2.4* 2.9* 2.8*  ?AST 41 39 28  ?ALT 46* 52* 44  ?ALKPHOS 39 46 43  ?BILITOT 1.0 1.1 1.1  ?GFRNONAA >60 >60 >60  ?ANIONGAP '7 8 7  '$ ?  ?Lipids No results for input(s): CHOL, TRIG, HDL, LABVLDL, LDLCALC, CHOLHDL in the last 168 hours.  ?Hematology ?Recent Labs  ?Lab 11/17/21 ?2320 11/18/21 ?0352 11/19/21 ?9163 11/19/21 ?1654 2021-12-12 ?0503  ?WBC 23.1* 18.6* 15.0*  --   --   ?RBC 2.61* 2.36* 3.77*  --   --   ?HGB 8.1* 7.5* 12.2* 11.5* 10.8*  ?HCT 23.2* 21.3* 34.1* 32.7* 31.2*  ?MCV 88.9 90.3  90.5  --   --   ?MCH 31.0 31.8 32.4  --   --   ?MCHC 34.9 35.2 35.8  --   --   ?RDW 15.2 15.1 16.1*  --   --   ?PLT 210 203 216  --   --   ? ?Thyroid  ?Recent Labs  ?Lab 11/15/21 ?1539  ?TSH 1.407  ?  ?BNPNo results for input(s): BNP, PROBNP in the last 168 hours.  ?DDimer  ?No results for input(s): DDIMER in the last 168 hours. ?  ? ?Radiology  ?  ?No results found. ? ?Cardiac Studies  ? ?Echo 11/09/21 ? ? 1. Left ventricular ejection fraction, by estimation, is 40 to 45%. The  ?left ventricle has mildly decreased function. The left ventricle  ?demonstrates global hypokinesis. Left ventricular diastolic parameters are  ?consistent with Grade I diastolic  ?dysfunction (impaired relaxation).  ? 2. Right ventricular systolic function is normal. The right ventricular  ?size is mildly enlarged.  ? 3. The mitral valve is grossly normal. No evidence of mitral valve  ?regurgitation.  ? 4. The aortic valve was not well visualized. Aortic valve regurgitation  ?is not visualized.  ? 5. The inferior vena cava is normal in size with greater than 50%  ?respiratory variability, suggesting right atrial pressure of 3 mmHg.  ? ?Conclusion(s)/Recommendation(s): EF more reduced from prior  ? ?Patient Profile  ?   ?88 y.o. male with CAD s/p LAD DES 2014, paroxysmal atrial flutter, SSS s/p PPM, hypertension, COPD, DM, admitted with NSTEMI in the setting of COVID-19 PNA (11/06/21) ? ?Assessment & Plan  ?  ?# NSTEMI:  ?# CAD s/p PCI:  ?Occurred in the setting of COVID PNA and hypoxic respiratory failure.  HS-troponin increased from 90 to 6988.  He initially had angina and LVEF 40-45% down from 50-55% 07/2020 there was consideration for cath.  This was deferred for medical management given acute illness.  LVEF improved to 45-50% on repeat. Treated with IV heparin initially.  Prior LAD PCI in 2014.  He subsequently developed GI bleed requiring multiple transfusions.  ASA and Eliquis have been held.  Unfortunately nothing else can be done from  a cardiology standpoint at this time.  Fortunately he doesn't have chest pain.  Given his GI bleed requiring on going transfusion, he is not a candidate for any invasive cardiac procedures.  Recommend continuing to transfuse to maintain hgb >8.  Timing of restarting antiplatelets/anticoagulation is per GI and cannot occur until the bleeding source is controlled.  Continue beta blocker, Imdur, and statin.   ? ?# GI Bleed:  ?Hgb increased from 7.5--> 12.2-->11.5-->10.8 after transfusion.  Continue PPI. Per IM/GI.  Holding all blood thinners as above. ? ?# Acute on chronic systolic and diastolic heart failure:  ?Will switch to lasix '40mg'$  po daily.  OK to give IV if he requires additional transfusion. ? ?# Hypoxic respiratory failure:  ?# COVID PNA: ?# Bacterial PNA: ?Per IM. ? ?# Atrial flutter:  ?DCCV 08/2020.  Continue metoprolol.  Anticoagulation on hold as above.  ? ?# SSS:  ?S/p PPM. ? ? Patient has been seen by Palliative care and plans for DNR.  Elects for discharge home with palliative services when stable for D/C. ? ?Cardiology will follow from [place].  Please call if there are questions.  ? ?For questions or updates, please contact [place] ?Please consult [email or web address] for contact info under  ? ?  ?   ?Signed, ?Skeet Latch, MD  ?December 05, 2021, 12:42 PM   ? ?

## 2021-11-22 NOTE — Death Summary Note (Signed)
? ?DEATH SUMMARY  ? ?Patient Details  ?Name: Phillip Jordan ?MRN: [id number] ?DOB: 1929-10-08 ?PCP:No primary care provider on file. ?Admission/Discharge Information  ? ?Admit Date:  Nov 18, 2021  ?Date of Death: Date of Death: 11/30/2021  ?Time of Death: Time of Death: 15-Nov-2348  ?Length of Stay: 13  ? ?Principle Cause of death: myocardial infarction ? ?Hospital Diagnoses: ?Principal Problem: ?  Acute respiratory failure with hypoxia ([place]) ?Active Problems: ?  Pneumonia due to COVID-19 virus ?  CAD (coronary artery disease) ?  Bacterial pneumonia ?  NSTEMI (non-ST elevated myocardial infarction) ([place]) ?  Type 2 diabetes, controlled, with retinopathy ([place]) ?  ABLA (acute blood loss anemia) ?  Lower GI bleed ?  Hyponatremia ?  Acute on chronic combined systolic and diastolic CHF (congestive heart failure) ([place]) ?  COPD GOLD III ?  Hypertension, essential ? ? ?Hospital Course: ?Phillip Jordan is an 88 y.o. 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 He was diagnosed with COVID-19 on 09/08/2021 and he did follow-up with his PCP was started on Molnupiravir but did not improve so he came into the ED. ?  ?Assessment/Plan: ? ?NSTEMI: ?Patient did have GI bleed so aspirin and Eliquis was on hold.  Was on Imdur, metoprolol.  Not getting Plavix since patient is high risk.  Cardiology was consulted and recommend cardiac cath on an outpatient basis.   ? ?Acute respiratory failure with hypoxia  due to COVID-19 pneumonia: ?Was on 2 L of oxygen incentive spirometry, flutter valve, steroid taper.  Patient had completed 5-day course of IV remdesivir and also received Actemra 11/10/2021.  Chest x-ray on 11/16/2021 showed no acute changes.  Had completed course of IV Rocephin and azithromycin on 11/12/2021  and was on IV Zosyn for possible diverticulitis.    ?  ?Acute blood loss normocytic anemia/lower GI bleed in the setting of acute diverticulitis: ?Hemoglobin on 3/28 was at 7.5.  Had multiple bloody bowel movement during hospitalization and received 2 units of packed RBC.  FOBT was positive.  CT scan of the abdomen pelvis showed acute diverticulitis continue to hold aspirin and Eliquis. Patient did have 1 large melanotic stool  on 11/19/2021 and GI was consulted.  GI has seen the patient and recommended  IV Protonix Did have a small melanotic bowel movement on 30-Nov-2021   Latest hemoglobin was 10.8.  Was on IV Zosyn for diverticulitis. ? ?Leukocytosis likely due to Acute diverticulitis/duodenitis: ?Was IV Zosyn.  Patient is afebrile.  CT scan of the abdomen pelvis with anasarca and diverticulitis, duodenal thickening, possible chronic pancreatitis.   ?  ?Paroxysmal atrial fibrillation/sick sinus syndrome status post pacemaker: ?AV paced.  Was on metoprolol.  Eliquis was on hold. ? ?Hypervolemic hyponatremia: ?CT scan with anasarca.  Possible third spacing.  Sodium of 127 >127.  Patient received packed RBC and Lasix. ? ? Chronic combined diastolic and systolic heart failure: ?Last 2D echo showed an EF of 45% with left ventricular function mildly reduced with global hypokinesia in the setting probable NSTEMI.   Received IV Lasix during hospitalization.    Patient was negative balance  for 7484 mL. ? ?Type 2 diabetes, controlled, with retinopathy ([place]) ?Received long-acting and sliding scale insulin during hospitalization.  ? ?Hypokalemia: ?Improved after replacement.  Latest potassium was 3.6. ? ?Deconditioning debility. Seen by physical  therapy who recommended home health PT on discharge. ? ?Goals of care.  Patient was DNR.  Palliative care was on board. It appears that rapid response was called in after patient was treated for agitation and was declared death at 2350 when patient was unresponsive and  apneic. ? ? ?Procedures: PRBC transfusion 4 units ? ?Consultations: Cardiology,  ?GI ?Palliative care ?  ? ?The results of significant diagnostics from this hospitalization (including imaging, microbiology, ancillary and laboratory) are listed below for reference.  ? ?Significant Diagnostic Studies: ?CT ABDOMEN PELVIS WO CONTRAST ? ?Result Date: 11/16/2021 ?CLINICAL DATA:  Abdominal pain. EXAM: CT ABDOMEN AND PELVIS WITHOUT CONTRAST TECHNIQUE: Multidetector CT imaging of the abdomen and pelvis was performed following the standard protocol without IV contrast. RADIATION DOSE REDUCTION: This exam was performed according to the departmental dose-optimization program which includes automated exposure control, adjustment of the mA and/or kV according to patient size and/or use of iterative reconstruction technique. COMPARISON:  CT with IV contrast 09/28/2007 FINDINGS: Lower chest: Small right-greater-than-left layering pleural effusions are noted. There is adjacent atelectasis or consolidation in the lower lobes. There are numerous tiny calcified granulomas in both lower lung fields, not seen previously and primarily peripherally distributed. Emphysematous and mild chronic changes are again shown. The cardiac size is normal. There is artifact from pacemaker wires in the heart. There is no pericardial effusion. Heavy three-vessel calcific CAD is again shown. Hepatobiliary: No focal abnormality of the unenhanced liver. The gallbladder bile ducts are unremarkable. Pancreas: No mass or ductal dilatation without contrast is seen. Stranding is noted in the pancreaticoduodenal groove but it appears related to the duodenum rather than the pancreas. Laboratory and clinical correlation for early pancreatitis is suggested. A few parenchymal calcifications are again noted posteriorly in the pancreatic head. Spleen: Unremarkable without contrast. Adrenals/Urinary Tract: There is no adrenal mass. There is a 1 cm cyst in the upper  lateral right kidney. The unenhanced renal cortex is otherwise unremarkable. There is no urinary stone or obstruction. Bladder is catheterized, contracted and not well seen but could be thickened. Stomach/Bowel: Small hiatal hernia. Unremarkable gastric wall. There is wall thickening and stranding in the descending and proximal third portion of the duodenum. Fold thickening and fluid filling in the jejunum. The small bowel is otherwise unremarkable. The appendix is normal. There is advanced left colonic diverticulosis. A short segment of the proximal sigmoid colon demonstrates wall thickening and faint adjacent stranding consistent with an early acute diverticulitis. There is moderate retained stool in the rectum without wall thickening and no further inflammatory changes are seen. Vascular/Lymphatic: No lymphadenopathy is seen. There is heavy aortoiliac calcific plaque with displaced intimal calcifications into the lumen again noted in the proximal infrarenal aorta as before, and distal to this a 2.8 cm infrarenal AAA which was previously 2.5 cm. Reproductive: Prostate upper limits of normal in size. Other: Body wall anasarca extends along the flanks, buttocks and upper thighs. There are mild dependent mesenteric congestive changes in the pelvis and a small volume of posterior deep pelvic ascites. There is no free air, hemorrhage or abscess. There is no incarcerated hernia. Musculoskeletal: Osteopenia and degenerative change lumbar spine, slight lumbar dextroscoliosis. There is severe acquired spinal stenosis at L3-4. Moderate bilateral hip DJD right-greater-than-left. IMPRESSION: 1. Wall thickening and faint stranding involving a short-segment of the proximal sigmoid colon consistent with acute diverticulitis. No free air or abscess. Colonoscopy follow-up recommended after treatment to exclude underlying lesion. 2. Moderate retained stool in the rectum without evidence of  wall thickening. 3. Wall thickening and  stranding in the second and third portions of the duodenum consistent with inflammatory duodenitis or inflammatory ulcerative disease. No penetrating ulcer or free air. Additional evidence suggesting a nonspecific jejunal en

## 2021-11-22 NOTE — Significant Event (Signed)
Rapid Response Event Note  ? ?Reason for Call :  ?Respiratory distress ? ?Initial Focused Assessment:  ?Patient alert, responsive, hard of hearing, but able to answer questions. Denies pain at time of assessment, but was recently given Nitroglycerin for chest pain. Patient states he is very short of breath. Lungs slight wheeze in bases bilaterally, but generally diminished. Peripheral edema x 4 but no weeping. Daughter at bedside. Patient is DNR and followed by palliative care. Family came to bedside during this assessment. Nebulizer treatment given at time of initial call eventually effective as evidenced by patient relaxing and holding wife's hand. ? ?Interventions:  ?Assessment completed. Educated about respiratory distress interventions and likely reoccurrence given his medical history.  ? ?Plan of Care:  ?Patient to remain in current assignment with wife and daughter at bedside.  ? ? ?Event Summary:  ? ?MD Notified: Gershon Cull, NP @ 2200 by bedside RN ?Call Time: 2215 ?Arrival Time: 2220 ?End Time: 2300 ? ?Selinda Michaels, RN ?

## 2021-11-22 DEATH — deceased

## 2021-11-26 ENCOUNTER — Encounter: Payer: HMO | Admitting: Internal Medicine

## 2021-11-28 ENCOUNTER — Ambulatory Visit: Payer: HMO | Admitting: Cardiovascular Disease

## 2021-12-10 ENCOUNTER — Ambulatory Visit: Payer: HMO | Admitting: Podiatry

## 2021-12-22 ENCOUNTER — Telehealth: Payer: HMO

## 2022-01-05 ENCOUNTER — Ambulatory Visit: Payer: HMO | Admitting: Pulmonary Disease
